# Patient Record
Sex: Female | Born: 1950 | Race: Black or African American | Hispanic: No | Smoking: Former smoker
Health system: Southern US, Community
[De-identification: ages and names within clinical notes are randomized; demographics above are authoritative.]

## PROBLEM LIST (undated history)

## (undated) DIAGNOSIS — E785 Hyperlipidemia, unspecified: Secondary | ICD-10-CM

## (undated) DIAGNOSIS — H409 Unspecified glaucoma: Secondary | ICD-10-CM

## (undated) DIAGNOSIS — N289 Disorder of kidney and ureter, unspecified: Secondary | ICD-10-CM

## (undated) DIAGNOSIS — I10 Essential (primary) hypertension: Secondary | ICD-10-CM

## (undated) DIAGNOSIS — I6523 Occlusion and stenosis of bilateral carotid arteries: Secondary | ICD-10-CM

## (undated) DIAGNOSIS — Z9289 Personal history of other medical treatment: Secondary | ICD-10-CM

## (undated) DIAGNOSIS — M1990 Unspecified osteoarthritis, unspecified site: Secondary | ICD-10-CM

## (undated) DIAGNOSIS — K8020 Calculus of gallbladder without cholecystitis without obstruction: Secondary | ICD-10-CM

## (undated) DIAGNOSIS — I82409 Acute embolism and thrombosis of unspecified deep veins of unspecified lower extremity: Secondary | ICD-10-CM

## (undated) DIAGNOSIS — K219 Gastro-esophageal reflux disease without esophagitis: Secondary | ICD-10-CM

## (undated) DIAGNOSIS — T8610 Unspecified complication of kidney transplant: Secondary | ICD-10-CM

## (undated) DIAGNOSIS — D6861 Antiphospholipid syndrome: Secondary | ICD-10-CM

## (undated) DIAGNOSIS — N186 End stage renal disease: Secondary | ICD-10-CM

## (undated) DIAGNOSIS — I272 Pulmonary hypertension, unspecified: Secondary | ICD-10-CM

## (undated) DIAGNOSIS — J189 Pneumonia, unspecified organism: Secondary | ICD-10-CM

## (undated) DIAGNOSIS — G5600 Carpal tunnel syndrome, unspecified upper limb: Secondary | ICD-10-CM

## (undated) DIAGNOSIS — I480 Paroxysmal atrial fibrillation: Secondary | ICD-10-CM

## (undated) DIAGNOSIS — K635 Polyp of colon: Secondary | ICD-10-CM

## (undated) DIAGNOSIS — I2510 Atherosclerotic heart disease of native coronary artery without angina pectoris: Secondary | ICD-10-CM

## (undated) DIAGNOSIS — K649 Unspecified hemorrhoids: Secondary | ICD-10-CM

## (undated) DIAGNOSIS — K222 Esophageal obstruction: Secondary | ICD-10-CM

## (undated) DIAGNOSIS — R011 Cardiac murmur, unspecified: Secondary | ICD-10-CM

## (undated) DIAGNOSIS — Z992 Dependence on renal dialysis: Secondary | ICD-10-CM

## (undated) HISTORY — DX: Esophageal obstruction: K22.2

## (undated) HISTORY — DX: Antiphospholipid syndrome: D68.61

## (undated) HISTORY — PX: PERITONEAL CATHETER REMOVAL: SUR1106

## (undated) HISTORY — DX: Unspecified hemorrhoids: K64.9

## (undated) HISTORY — DX: Carpal tunnel syndrome, unspecified upper limb: G56.00

## (undated) HISTORY — PX: PERITONEAL CATHETER INSERTION: SHX2223

## (undated) HISTORY — PX: ARTERIOVENOUS GRAFT PLACEMENT: SUR1029

## (undated) HISTORY — DX: Hyperlipidemia, unspecified: E78.5

## (undated) HISTORY — DX: Occlusion and stenosis of bilateral carotid arteries: I65.23

## (undated) HISTORY — DX: Calculus of gallbladder without cholecystitis without obstruction: K80.20

## (undated) HISTORY — DX: Essential (primary) hypertension: I10

## (undated) HISTORY — DX: Polyp of colon: K63.5

## (undated) HISTORY — DX: Pulmonary hypertension, unspecified: I27.20

## (undated) HISTORY — DX: Paroxysmal atrial fibrillation: I48.0

## (undated) HISTORY — PX: THROMBECTOMY AND REVISION OF ARTERIOVENTOUS (AV) GORETEX  GRAFT: SHX6120

---

## 1978-01-22 DIAGNOSIS — K8020 Calculus of gallbladder without cholecystitis without obstruction: Secondary | ICD-10-CM

## 1978-01-22 HISTORY — DX: Calculus of gallbladder without cholecystitis without obstruction: K80.20

## 1978-01-22 HISTORY — PX: CHOLECYSTECTOMY: SHX55

## 1988-01-23 DIAGNOSIS — I82409 Acute embolism and thrombosis of unspecified deep veins of unspecified lower extremity: Secondary | ICD-10-CM

## 1988-01-23 HISTORY — DX: Acute embolism and thrombosis of unspecified deep veins of unspecified lower extremity: I82.409

## 1996-01-23 HISTORY — PX: HEMATOMA EVACUATION: SHX5118

## 1997-05-17 ENCOUNTER — Other Ambulatory Visit: Admission: RE | Admit: 1997-05-17 | Discharge: 1997-05-17 | Payer: Self-pay | Admitting: Nephrology

## 1997-05-28 ENCOUNTER — Other Ambulatory Visit: Admission: RE | Admit: 1997-05-28 | Discharge: 1997-05-28 | Payer: Self-pay | Admitting: Nephrology

## 1997-06-07 ENCOUNTER — Other Ambulatory Visit: Admission: RE | Admit: 1997-06-07 | Discharge: 1997-06-07 | Payer: Self-pay | Admitting: Nephrology

## 1997-06-09 ENCOUNTER — Other Ambulatory Visit: Admission: RE | Admit: 1997-06-09 | Discharge: 1997-06-09 | Payer: Self-pay | Admitting: Nephrology

## 1997-06-16 ENCOUNTER — Other Ambulatory Visit: Admission: RE | Admit: 1997-06-16 | Discharge: 1997-06-16 | Payer: Self-pay | Admitting: Nephrology

## 1997-06-25 ENCOUNTER — Other Ambulatory Visit: Admission: RE | Admit: 1997-06-25 | Discharge: 1997-06-25 | Payer: Self-pay | Admitting: Nephrology

## 1997-07-07 ENCOUNTER — Other Ambulatory Visit: Admission: RE | Admit: 1997-07-07 | Discharge: 1997-07-07 | Payer: Self-pay | Admitting: Nephrology

## 1997-07-14 ENCOUNTER — Other Ambulatory Visit: Admission: RE | Admit: 1997-07-14 | Discharge: 1997-07-14 | Payer: Self-pay | Admitting: Nephrology

## 1997-07-23 ENCOUNTER — Other Ambulatory Visit: Admission: RE | Admit: 1997-07-23 | Discharge: 1997-07-23 | Payer: Self-pay | Admitting: Nephrology

## 1997-08-06 ENCOUNTER — Other Ambulatory Visit: Admission: RE | Admit: 1997-08-06 | Discharge: 1997-08-06 | Payer: Self-pay | Admitting: Nephrology

## 1997-08-11 ENCOUNTER — Other Ambulatory Visit: Admission: RE | Admit: 1997-08-11 | Discharge: 1997-08-11 | Payer: Self-pay | Admitting: Nephrology

## 1997-08-18 ENCOUNTER — Other Ambulatory Visit: Admission: RE | Admit: 1997-08-18 | Discharge: 1997-08-18 | Payer: Self-pay | Admitting: Nephrology

## 1997-08-20 ENCOUNTER — Other Ambulatory Visit: Admission: RE | Admit: 1997-08-20 | Discharge: 1997-08-20 | Payer: Self-pay | Admitting: Nephrology

## 1997-09-10 ENCOUNTER — Ambulatory Visit (HOSPITAL_COMMUNITY): Admission: RE | Admit: 1997-09-10 | Discharge: 1997-09-10 | Payer: Self-pay | Admitting: *Deleted

## 1997-09-21 ENCOUNTER — Ambulatory Visit (HOSPITAL_COMMUNITY): Admission: RE | Admit: 1997-09-21 | Discharge: 1997-09-21 | Payer: Self-pay | Admitting: Internal Medicine

## 1997-09-22 ENCOUNTER — Ambulatory Visit (HOSPITAL_COMMUNITY): Admission: RE | Admit: 1997-09-22 | Discharge: 1997-09-22 | Payer: Self-pay | Admitting: Vascular Surgery

## 1997-11-09 ENCOUNTER — Encounter: Payer: Self-pay | Admitting: Vascular Surgery

## 1997-11-09 ENCOUNTER — Ambulatory Visit (HOSPITAL_COMMUNITY): Admission: RE | Admit: 1997-11-09 | Discharge: 1997-11-09 | Payer: Self-pay | Admitting: Vascular Surgery

## 1997-11-12 ENCOUNTER — Encounter: Payer: Self-pay | Admitting: Thoracic Surgery

## 1997-11-12 ENCOUNTER — Ambulatory Visit (HOSPITAL_COMMUNITY): Admission: RE | Admit: 1997-11-12 | Discharge: 1997-11-12 | Payer: Self-pay | Admitting: Thoracic Surgery

## 1998-01-16 ENCOUNTER — Observation Stay (HOSPITAL_COMMUNITY): Admission: EM | Admit: 1998-01-16 | Discharge: 1998-01-16 | Payer: Self-pay | Admitting: Emergency Medicine

## 1998-09-05 ENCOUNTER — Observation Stay (HOSPITAL_COMMUNITY): Admission: EM | Admit: 1998-09-05 | Discharge: 1998-09-05 | Payer: Self-pay | Admitting: Emergency Medicine

## 1998-09-05 ENCOUNTER — Encounter: Payer: Self-pay | Admitting: *Deleted

## 1998-10-11 ENCOUNTER — Encounter: Payer: Self-pay | Admitting: Vascular Surgery

## 1998-10-11 ENCOUNTER — Ambulatory Visit (HOSPITAL_COMMUNITY): Admission: RE | Admit: 1998-10-11 | Discharge: 1998-10-11 | Payer: Self-pay | Admitting: Vascular Surgery

## 1998-10-22 ENCOUNTER — Encounter: Payer: Self-pay | Admitting: Vascular Surgery

## 1998-10-22 ENCOUNTER — Ambulatory Visit (HOSPITAL_COMMUNITY): Admission: RE | Admit: 1998-10-22 | Discharge: 1998-10-22 | Payer: Self-pay | Admitting: Vascular Surgery

## 1999-07-30 ENCOUNTER — Emergency Department (HOSPITAL_COMMUNITY): Admission: EM | Admit: 1999-07-30 | Discharge: 1999-07-30 | Payer: Self-pay | Admitting: Emergency Medicine

## 1999-07-31 ENCOUNTER — Ambulatory Visit (HOSPITAL_COMMUNITY): Admission: RE | Admit: 1999-07-31 | Discharge: 1999-07-31 | Payer: Self-pay | Admitting: Vascular Surgery

## 1999-11-02 ENCOUNTER — Emergency Department (HOSPITAL_COMMUNITY): Admission: EM | Admit: 1999-11-02 | Discharge: 1999-11-02 | Payer: Self-pay | Admitting: Emergency Medicine

## 1999-11-02 ENCOUNTER — Encounter: Payer: Self-pay | Admitting: Emergency Medicine

## 1999-12-09 ENCOUNTER — Emergency Department (HOSPITAL_COMMUNITY): Admission: EM | Admit: 1999-12-09 | Discharge: 1999-12-10 | Payer: Self-pay | Admitting: Emergency Medicine

## 2000-12-05 ENCOUNTER — Emergency Department (HOSPITAL_COMMUNITY): Admission: EM | Admit: 2000-12-05 | Discharge: 2000-12-05 | Payer: Self-pay | Admitting: Emergency Medicine

## 2001-01-22 DIAGNOSIS — G5600 Carpal tunnel syndrome, unspecified upper limb: Secondary | ICD-10-CM

## 2001-01-22 HISTORY — DX: Carpal tunnel syndrome, unspecified upper limb: G56.00

## 2001-06-26 ENCOUNTER — Other Ambulatory Visit: Admission: RE | Admit: 2001-06-26 | Discharge: 2001-06-26 | Payer: Self-pay | Admitting: Obstetrics and Gynecology

## 2001-07-22 HISTORY — PX: CARPAL TUNNEL RELEASE: SHX101

## 2001-08-12 ENCOUNTER — Encounter (INDEPENDENT_AMBULATORY_CARE_PROVIDER_SITE_OTHER): Payer: Self-pay | Admitting: *Deleted

## 2001-08-12 ENCOUNTER — Ambulatory Visit (HOSPITAL_BASED_OUTPATIENT_CLINIC_OR_DEPARTMENT_OTHER): Admission: RE | Admit: 2001-08-12 | Discharge: 2001-08-12 | Payer: Self-pay | Admitting: Orthopedic Surgery

## 2001-08-17 ENCOUNTER — Emergency Department (HOSPITAL_COMMUNITY): Admission: EM | Admit: 2001-08-17 | Discharge: 2001-08-17 | Payer: Self-pay | Admitting: *Deleted

## 2001-11-11 ENCOUNTER — Other Ambulatory Visit: Admission: RE | Admit: 2001-11-11 | Discharge: 2001-11-11 | Payer: Self-pay | Admitting: Obstetrics and Gynecology

## 2001-11-11 ENCOUNTER — Other Ambulatory Visit: Admission: RE | Admit: 2001-11-11 | Discharge: 2001-11-11 | Payer: Self-pay | Admitting: *Deleted

## 2002-01-20 ENCOUNTER — Other Ambulatory Visit: Admission: RE | Admit: 2002-01-20 | Discharge: 2002-01-20 | Payer: Self-pay | Admitting: Obstetrics and Gynecology

## 2002-04-28 ENCOUNTER — Encounter: Payer: Self-pay | Admitting: Emergency Medicine

## 2002-04-28 ENCOUNTER — Emergency Department (HOSPITAL_COMMUNITY): Admission: EM | Admit: 2002-04-28 | Discharge: 2002-04-28 | Payer: Self-pay | Admitting: Emergency Medicine

## 2002-05-18 ENCOUNTER — Ambulatory Visit (HOSPITAL_COMMUNITY): Admission: RE | Admit: 2002-05-18 | Discharge: 2002-05-18 | Payer: Self-pay | Admitting: Vascular Surgery

## 2002-05-18 ENCOUNTER — Encounter: Payer: Self-pay | Admitting: Vascular Surgery

## 2002-10-07 ENCOUNTER — Encounter: Payer: Self-pay | Admitting: Vascular Surgery

## 2002-10-07 ENCOUNTER — Ambulatory Visit (HOSPITAL_COMMUNITY): Admission: RE | Admit: 2002-10-07 | Discharge: 2002-10-07 | Payer: Self-pay | Admitting: Vascular Surgery

## 2002-10-20 ENCOUNTER — Encounter: Payer: Self-pay | Admitting: Vascular Surgery

## 2002-10-20 ENCOUNTER — Ambulatory Visit (HOSPITAL_COMMUNITY): Admission: RE | Admit: 2002-10-20 | Discharge: 2002-10-20 | Payer: Self-pay | Admitting: Vascular Surgery

## 2002-11-17 ENCOUNTER — Ambulatory Visit (HOSPITAL_COMMUNITY): Admission: RE | Admit: 2002-11-17 | Discharge: 2002-11-17 | Payer: Self-pay | Admitting: Nephrology

## 2003-06-01 ENCOUNTER — Emergency Department (HOSPITAL_COMMUNITY): Admission: EM | Admit: 2003-06-01 | Discharge: 2003-06-01 | Payer: Self-pay

## 2003-06-03 ENCOUNTER — Emergency Department (HOSPITAL_COMMUNITY): Admission: EM | Admit: 2003-06-03 | Discharge: 2003-06-03 | Payer: Self-pay | Admitting: Emergency Medicine

## 2003-10-02 ENCOUNTER — Emergency Department (HOSPITAL_COMMUNITY): Admission: EM | Admit: 2003-10-02 | Discharge: 2003-10-02 | Payer: Self-pay | Admitting: *Deleted

## 2003-10-08 ENCOUNTER — Encounter: Admission: RE | Admit: 2003-10-08 | Discharge: 2003-10-08 | Payer: Self-pay | Admitting: Nephrology

## 2003-12-20 ENCOUNTER — Emergency Department (HOSPITAL_COMMUNITY): Admission: EM | Admit: 2003-12-20 | Discharge: 2003-12-20 | Payer: Self-pay | Admitting: Emergency Medicine

## 2003-12-28 ENCOUNTER — Encounter: Admission: RE | Admit: 2003-12-28 | Discharge: 2003-12-28 | Payer: Self-pay | Admitting: Nephrology

## 2004-06-23 ENCOUNTER — Ambulatory Visit: Payer: Self-pay | Admitting: Internal Medicine

## 2004-07-17 ENCOUNTER — Ambulatory Visit: Payer: Self-pay | Admitting: Internal Medicine

## 2004-07-17 ENCOUNTER — Ambulatory Visit (HOSPITAL_COMMUNITY): Admission: RE | Admit: 2004-07-17 | Discharge: 2004-07-17 | Payer: Self-pay | Admitting: Internal Medicine

## 2004-08-04 ENCOUNTER — Other Ambulatory Visit: Admission: RE | Admit: 2004-08-04 | Discharge: 2004-08-04 | Payer: Self-pay | Admitting: Obstetrics & Gynecology

## 2004-08-07 ENCOUNTER — Other Ambulatory Visit: Admission: RE | Admit: 2004-08-07 | Discharge: 2004-08-07 | Payer: Self-pay | Admitting: Obstetrics & Gynecology

## 2006-01-07 ENCOUNTER — Ambulatory Visit (HOSPITAL_COMMUNITY): Admission: RE | Admit: 2006-01-07 | Discharge: 2006-01-07 | Payer: Self-pay | Admitting: Nephrology

## 2006-01-14 ENCOUNTER — Ambulatory Visit (HOSPITAL_COMMUNITY): Admission: RE | Admit: 2006-01-14 | Discharge: 2006-01-14 | Payer: Self-pay | Admitting: Vascular Surgery

## 2006-03-07 ENCOUNTER — Emergency Department (HOSPITAL_COMMUNITY): Admission: EM | Admit: 2006-03-07 | Discharge: 2006-03-07 | Payer: Self-pay | Admitting: Emergency Medicine

## 2006-04-08 ENCOUNTER — Ambulatory Visit (HOSPITAL_COMMUNITY): Admission: RE | Admit: 2006-04-08 | Discharge: 2006-04-08 | Payer: Self-pay | Admitting: Nephrology

## 2007-05-26 ENCOUNTER — Ambulatory Visit (HOSPITAL_COMMUNITY): Admission: RE | Admit: 2007-05-26 | Discharge: 2007-05-26 | Payer: Self-pay | Admitting: Nephrology

## 2007-07-04 ENCOUNTER — Emergency Department (HOSPITAL_COMMUNITY): Admission: EM | Admit: 2007-07-04 | Discharge: 2007-07-04 | Payer: Self-pay | Admitting: Emergency Medicine

## 2007-09-14 ENCOUNTER — Ambulatory Visit: Payer: Self-pay | Admitting: Vascular Surgery

## 2007-09-14 ENCOUNTER — Observation Stay (HOSPITAL_COMMUNITY): Admission: EM | Admit: 2007-09-14 | Discharge: 2007-09-14 | Payer: Self-pay | Admitting: Emergency Medicine

## 2007-10-27 ENCOUNTER — Ambulatory Visit (HOSPITAL_COMMUNITY): Admission: RE | Admit: 2007-10-27 | Discharge: 2007-10-27 | Payer: Self-pay | Admitting: Nephrology

## 2007-10-30 ENCOUNTER — Ambulatory Visit (HOSPITAL_COMMUNITY): Admission: RE | Admit: 2007-10-30 | Discharge: 2007-10-30 | Payer: Self-pay | Admitting: Nephrology

## 2008-04-04 ENCOUNTER — Emergency Department (HOSPITAL_COMMUNITY): Admission: EM | Admit: 2008-04-04 | Discharge: 2008-04-04 | Payer: Self-pay | Admitting: Emergency Medicine

## 2008-04-26 ENCOUNTER — Ambulatory Visit (HOSPITAL_COMMUNITY): Admission: RE | Admit: 2008-04-26 | Discharge: 2008-04-26 | Payer: Self-pay | Admitting: Nephrology

## 2008-05-22 HISTORY — PX: NEPHRECTOMY TRANSPLANTED ORGAN: SUR880

## 2008-05-25 ENCOUNTER — Ambulatory Visit (HOSPITAL_COMMUNITY): Admission: RE | Admit: 2008-05-25 | Discharge: 2008-05-25 | Payer: Self-pay | Admitting: Nephrology

## 2008-10-07 ENCOUNTER — Emergency Department (HOSPITAL_COMMUNITY): Admission: EM | Admit: 2008-10-07 | Discharge: 2008-10-07 | Payer: Self-pay | Admitting: Emergency Medicine

## 2008-11-04 ENCOUNTER — Encounter (HOSPITAL_COMMUNITY): Admission: RE | Admit: 2008-11-04 | Discharge: 2009-02-02 | Payer: Self-pay | Admitting: Nephrology

## 2008-12-06 ENCOUNTER — Encounter: Admission: RE | Admit: 2008-12-06 | Discharge: 2008-12-06 | Payer: Self-pay | Admitting: Nephrology

## 2008-12-20 ENCOUNTER — Encounter: Admission: RE | Admit: 2008-12-20 | Discharge: 2008-12-20 | Payer: Self-pay | Admitting: Nephrology

## 2008-12-23 ENCOUNTER — Emergency Department (HOSPITAL_COMMUNITY): Admission: EM | Admit: 2008-12-23 | Discharge: 2008-12-23 | Payer: Self-pay | Admitting: Emergency Medicine

## 2009-02-16 ENCOUNTER — Encounter: Admission: RE | Admit: 2009-02-16 | Discharge: 2009-02-16 | Payer: Self-pay | Admitting: Nephrology

## 2009-02-23 ENCOUNTER — Encounter (HOSPITAL_COMMUNITY): Admission: RE | Admit: 2009-02-23 | Discharge: 2009-05-24 | Payer: Self-pay | Admitting: Nephrology

## 2009-06-08 ENCOUNTER — Encounter (HOSPITAL_COMMUNITY): Admission: RE | Admit: 2009-06-08 | Discharge: 2009-09-06 | Payer: Self-pay | Admitting: Nephrology

## 2009-09-16 ENCOUNTER — Encounter (HOSPITAL_COMMUNITY): Admission: RE | Admit: 2009-09-16 | Discharge: 2009-12-15 | Payer: Self-pay | Admitting: Nephrology

## 2009-12-20 ENCOUNTER — Encounter (HOSPITAL_COMMUNITY)
Admission: RE | Admit: 2009-12-20 | Discharge: 2010-02-21 | Payer: Self-pay | Source: Home / Self Care | Attending: Nephrology | Admitting: Nephrology

## 2009-12-27 ENCOUNTER — Encounter: Admission: RE | Admit: 2009-12-27 | Discharge: 2009-12-27 | Payer: Self-pay | Admitting: Nephrology

## 2010-02-06 LAB — IRON AND TIBC
Iron: 53 ug/dL (ref 42–135)
Saturation Ratios: 23 % (ref 20–55)
TIBC: 235 ug/dL — ABNORMAL LOW (ref 250–470)
UIBC: 182 ug/dL

## 2010-02-06 LAB — POCT HEMOGLOBIN-HEMACUE: Hemoglobin: 11.2 g/dL — ABNORMAL LOW (ref 12.0–15.0)

## 2010-02-06 LAB — FERRITIN: Ferritin: 1013 ng/mL — ABNORMAL HIGH (ref 10–291)

## 2010-02-12 ENCOUNTER — Encounter: Payer: Self-pay | Admitting: Nephrology

## 2010-03-09 ENCOUNTER — Other Ambulatory Visit: Payer: Self-pay

## 2010-03-09 ENCOUNTER — Other Ambulatory Visit: Payer: Self-pay | Admitting: Nephrology

## 2010-03-09 ENCOUNTER — Encounter (HOSPITAL_COMMUNITY)
Admission: RE | Admit: 2010-03-09 | Discharge: 2010-03-09 | Disposition: A | Discharge status: Home / Self Care | Payer: Medicare Other | Source: Ambulatory Visit | Attending: Nephrology | Admitting: Nephrology

## 2010-03-09 DIAGNOSIS — M329 Systemic lupus erythematosus, unspecified: Secondary | ICD-10-CM | POA: Insufficient documentation

## 2010-03-09 DIAGNOSIS — Z940 Kidney transplant status: Secondary | ICD-10-CM | POA: Insufficient documentation

## 2010-03-09 DIAGNOSIS — D638 Anemia in other chronic diseases classified elsewhere: Secondary | ICD-10-CM | POA: Insufficient documentation

## 2010-03-09 LAB — IRON AND TIBC
Saturation Ratios: 31 % (ref 20–55)
TIBC: 226 ug/dL — ABNORMAL LOW (ref 250–470)
UIBC: 155 ug/dL

## 2010-03-10 LAB — POCT HEMOGLOBIN-HEMACUE: Hemoglobin: 11.4 g/dL — ABNORMAL LOW (ref 12.0–15.0)

## 2010-03-13 ENCOUNTER — Emergency Department (HOSPITAL_COMMUNITY): Payer: Medicare Other

## 2010-03-13 ENCOUNTER — Emergency Department (HOSPITAL_COMMUNITY)
Admission: EM | Admit: 2010-03-13 | Discharge: 2010-03-13 | Disposition: A | Discharge status: Home / Self Care | Payer: Medicare Other | Attending: Emergency Medicine | Admitting: Emergency Medicine

## 2010-03-13 DIAGNOSIS — Z79899 Other long term (current) drug therapy: Secondary | ICD-10-CM | POA: Insufficient documentation

## 2010-03-13 DIAGNOSIS — I129 Hypertensive chronic kidney disease with stage 1 through stage 4 chronic kidney disease, or unspecified chronic kidney disease: Secondary | ICD-10-CM | POA: Insufficient documentation

## 2010-03-13 DIAGNOSIS — Z7901 Long term (current) use of anticoagulants: Secondary | ICD-10-CM | POA: Insufficient documentation

## 2010-03-13 DIAGNOSIS — M329 Systemic lupus erythematosus, unspecified: Secondary | ICD-10-CM | POA: Insufficient documentation

## 2010-03-13 DIAGNOSIS — M79609 Pain in unspecified limb: Secondary | ICD-10-CM | POA: Insufficient documentation

## 2010-03-13 DIAGNOSIS — Z7982 Long term (current) use of aspirin: Secondary | ICD-10-CM | POA: Insufficient documentation

## 2010-03-13 DIAGNOSIS — N189 Chronic kidney disease, unspecified: Secondary | ICD-10-CM | POA: Insufficient documentation

## 2010-03-24 ENCOUNTER — Encounter (HOSPITAL_COMMUNITY): Payer: Medicare Other

## 2010-04-03 LAB — IRON AND TIBC
Saturation Ratios: 29 % (ref 20–55)
UIBC: 172 ug/dL

## 2010-04-04 LAB — IRON AND TIBC
Iron: 68 ug/dL (ref 42–135)
Saturation Ratios: 30 % (ref 20–55)
TIBC: 224 ug/dL — ABNORMAL LOW (ref 250–470)

## 2010-04-04 LAB — POCT HEMOGLOBIN-HEMACUE: Hemoglobin: 11.9 g/dL — ABNORMAL LOW (ref 12.0–15.0)

## 2010-04-06 LAB — IRON AND TIBC
Iron: 61 ug/dL (ref 42–135)
Saturation Ratios: 27 % (ref 20–55)
TIBC: 222 ug/dL — ABNORMAL LOW (ref 250–470)
UIBC: 161 ug/dL

## 2010-04-06 LAB — POCT HEMOGLOBIN-HEMACUE: Hemoglobin: 12.1 g/dL (ref 12.0–15.0)

## 2010-04-07 ENCOUNTER — Other Ambulatory Visit: Payer: Self-pay | Admitting: Nephrology

## 2010-04-07 ENCOUNTER — Encounter (HOSPITAL_COMMUNITY): Payer: Medicare Other | Attending: Nephrology

## 2010-04-07 ENCOUNTER — Other Ambulatory Visit: Payer: Self-pay

## 2010-04-07 DIAGNOSIS — D638 Anemia in other chronic diseases classified elsewhere: Secondary | ICD-10-CM | POA: Insufficient documentation

## 2010-04-07 DIAGNOSIS — M329 Systemic lupus erythematosus, unspecified: Secondary | ICD-10-CM | POA: Insufficient documentation

## 2010-04-07 DIAGNOSIS — Z940 Kidney transplant status: Secondary | ICD-10-CM | POA: Insufficient documentation

## 2010-04-07 LAB — POCT HEMOGLOBIN-HEMACUE: Hemoglobin: 11.5 g/dL — ABNORMAL LOW (ref 12.0–15.0)

## 2010-04-07 LAB — IRON AND TIBC
Iron: 73 ug/dL (ref 42–135)
Saturation Ratios: 26 % (ref 20–55)
TIBC: 231 ug/dL — ABNORMAL LOW (ref 250–470)
UIBC: 170 ug/dL

## 2010-04-07 LAB — FERRITIN: Ferritin: 1231 ng/mL — ABNORMAL HIGH (ref 10–291)

## 2010-04-08 LAB — IRON AND TIBC
Iron: 40 ug/dL — ABNORMAL LOW (ref 42–135)
Saturation Ratios: 31 % (ref 20–55)
TIBC: 221 ug/dL — ABNORMAL LOW (ref 250–470)
TIBC: 214 ug/dL — ABNORMAL LOW (ref 250–470)
UIBC: 152 ug/dL

## 2010-04-08 LAB — POCT HEMOGLOBIN-HEMACUE: Hemoglobin: 11.1 g/dL — ABNORMAL LOW (ref 12.0–15.0)

## 2010-04-08 LAB — FERRITIN: Ferritin: 767 ng/mL — ABNORMAL HIGH (ref 10–291)

## 2010-04-09 LAB — POCT HEMOGLOBIN-HEMACUE: Hemoglobin: 12.4 g/dL (ref 12.0–15.0)

## 2010-04-09 LAB — IRON AND TIBC
Iron: 81 ug/dL (ref 42–135)
TIBC: 224 ug/dL — ABNORMAL LOW (ref 250–470)
UIBC: 143 ug/dL

## 2010-04-10 LAB — POCT HEMOGLOBIN-HEMACUE
Hemoglobin: 12.3 g/dL (ref 12.0–15.0)
Hemoglobin: 12.6 g/dL (ref 12.0–15.0)

## 2010-04-10 LAB — IRON AND TIBC
Saturation Ratios: 29 % (ref 20–55)
UIBC: 153 ug/dL

## 2010-04-11 LAB — POCT HEMOGLOBIN-HEMACUE: Hemoglobin: 11.9 g/dL — ABNORMAL LOW (ref 12.0–15.0)

## 2010-04-12 LAB — IRON AND TIBC
Iron: 34 ug/dL — ABNORMAL LOW (ref 42–135)
Saturation Ratios: 16 % — ABNORMAL LOW (ref 20–55)
UIBC: 178 ug/dL
UIBC: 182 ug/dL

## 2010-04-12 LAB — POCT HEMOGLOBIN-HEMACUE: Hemoglobin: 11.4 g/dL — ABNORMAL LOW (ref 12.0–15.0)

## 2010-04-12 LAB — FERRITIN: Ferritin: 616 ng/mL — ABNORMAL HIGH (ref 10–291)

## 2010-04-14 LAB — IRON AND TIBC
Iron: 42 ug/dL (ref 42–135)
TIBC: 221 ug/dL — ABNORMAL LOW (ref 250–470)

## 2010-04-21 ENCOUNTER — Encounter (HOSPITAL_COMMUNITY): Payer: Medicare Other

## 2010-04-25 LAB — CK TOTAL AND CKMB (NOT AT ARMC)
CK, MB: 4.8 ng/mL — ABNORMAL HIGH (ref 0.3–4.0)
Relative Index: INVALID (ref 0.0–2.5)
Total CK: 93 U/L (ref 7–177)

## 2010-04-25 LAB — CBC
HCT: 34 % — ABNORMAL LOW (ref 36.0–46.0)
Platelets: 191 10*3/uL (ref 150–400)
WBC: 2.8 10*3/uL — ABNORMAL LOW (ref 4.0–10.5)

## 2010-04-25 LAB — BRAIN NATRIURETIC PEPTIDE: Pro B Natriuretic peptide (BNP): 1206 pg/mL — ABNORMAL HIGH (ref 0.0–100.0)

## 2010-04-25 LAB — TROPONIN I: Troponin I: 0.05 ng/mL (ref 0.00–0.06)

## 2010-04-25 LAB — URINALYSIS, ROUTINE W REFLEX MICROSCOPIC
Glucose, UA: NEGATIVE mg/dL
Protein, ur: NEGATIVE mg/dL
pH: 6 (ref 5.0–8.0)

## 2010-04-25 LAB — DIFFERENTIAL
Eosinophils Relative: 2 % (ref 0–5)
Lymphocytes Relative: 19 % (ref 12–46)
Lymphs Abs: 0.5 10*3/uL — ABNORMAL LOW (ref 0.7–4.0)
Neutro Abs: 1.9 10*3/uL (ref 1.7–7.7)

## 2010-04-25 LAB — BASIC METABOLIC PANEL
BUN: 22 mg/dL (ref 6–23)
GFR calc non Af Amer: 32 mL/min — ABNORMAL LOW (ref >60)
Potassium: 5.1 mEq/L (ref 3.5–5.1)
Sodium: 136 mEq/L (ref 135–145)

## 2010-04-25 LAB — IRON AND TIBC: Iron: 60 ug/dL (ref 42–135)

## 2010-04-27 LAB — POCT HEMOGLOBIN-HEMACUE
Hemoglobin: 8.7 g/dL — ABNORMAL LOW (ref 12.0–15.0)
Hemoglobin: 9.5 g/dL — ABNORMAL LOW (ref 12.0–15.0)

## 2010-05-04 ENCOUNTER — Encounter (HOSPITAL_COMMUNITY): Payer: Medicare Other

## 2010-05-11 ENCOUNTER — Other Ambulatory Visit: Payer: Self-pay | Admitting: Nephrology

## 2010-05-11 ENCOUNTER — Encounter (HOSPITAL_COMMUNITY): Payer: Medicare Other | Attending: Nephrology

## 2010-05-11 DIAGNOSIS — D638 Anemia in other chronic diseases classified elsewhere: Secondary | ICD-10-CM | POA: Insufficient documentation

## 2010-05-11 DIAGNOSIS — Z940 Kidney transplant status: Secondary | ICD-10-CM | POA: Insufficient documentation

## 2010-05-11 DIAGNOSIS — M329 Systemic lupus erythematosus, unspecified: Secondary | ICD-10-CM | POA: Insufficient documentation

## 2010-05-11 LAB — POCT HEMOGLOBIN-HEMACUE: Hemoglobin: 10.7 g/dL — ABNORMAL LOW (ref 12.0–15.0)

## 2010-05-11 LAB — IRON AND TIBC
Iron: 53 ug/dL (ref 42–135)
Saturation Ratios: 22 % (ref 20–55)
TIBC: 236 ug/dL — ABNORMAL LOW (ref 250–470)
UIBC: 183 ug/dL

## 2010-05-25 ENCOUNTER — Other Ambulatory Visit: Payer: Self-pay | Admitting: Nephrology

## 2010-05-25 ENCOUNTER — Encounter (HOSPITAL_COMMUNITY): Payer: Medicare Other | Attending: Nephrology

## 2010-05-25 DIAGNOSIS — M329 Systemic lupus erythematosus, unspecified: Secondary | ICD-10-CM | POA: Insufficient documentation

## 2010-05-25 DIAGNOSIS — D638 Anemia in other chronic diseases classified elsewhere: Secondary | ICD-10-CM | POA: Insufficient documentation

## 2010-05-25 DIAGNOSIS — Z940 Kidney transplant status: Secondary | ICD-10-CM | POA: Insufficient documentation

## 2010-06-06 NOTE — Op Note (Signed)
NAMESIBEL, KHURANA                ACCOUNT NO.:  [id number]   MEDICAL RECORD NO.:  [id number]          PATIENT TYPE:  OBV   LOCATION:  2550                         FACILITY:  [facility]   PHYSICIAN:  Di Kindle. Edilia Bo, M.D.DATE OF BIRTH:  1950/03/15   DATE OF PROCEDURE:  DATE OF DISCHARGE:                               OPERATIVE REPORT   PREOPERATIVE DIAGNOSIS:  End-stage renal disease.   POSTOPERATIVE DIAGNOSIS:  End-stage renal disease.   PROCEDURE:  Thrombectomy of right upper arm AV graft with revision of  graft (insertion of new segment of 7-mm PTFE to the more proximal  axillary vein.)   SURGEON:  Di Kindle. Edilia Bo, MD   ASSISTANT:  Nurse.   ANESTHESIA:  Local with sedation technique.   The patient was taken to the operating room sedated by anesthesia.  The  right upper extremity was prepped and draped in the usual sterile  fashion.  After the skin was infiltrated with 1% lidocaine, the incision  in the axilla was opened and the venous limb of the graft dissected  free.  I dissected up higher on the vein, higher up in the axilla, and  the vein here was patent.  The old venous anastomosis was excised.  The  patient was heparinized.  Graft thrombectomy was achieved using a #4  Fogarty catheter.  The arterial plug was clearly retrieved.  The graft  was flushed with heparinized saline clamp.  A new segment of graft was  brought into field, spatulated, and sewn end to end to the more proximal  spatulated vein using continuous 6-0 Prolene suture.  The new segment of  graft was then cut to the appropriate length and anastomosed end to end  to the old graft using continuous 6-0 Prolene suture.  At the  completion, there was an excellent thrill in the graft.  Hemostasis was  obtained in the wounds.  The wound was closed with a deep layer of 3-0  Vicryl and the skin closed with 4-0 Vicryl.  A sterile dressing was  applied.  The patient tolerated the procedure well and was  transferred  to the recovery room in satisfactory condition.  All needle and sponge  counts were correct.      Di Kindle. Edilia Bo, M.D.  Electronically Signed     CSD/MEDQ  D:  09/14/2007  T:  09/15/2007  Job:  [id number]

## 2010-06-08 ENCOUNTER — Encounter (HOSPITAL_COMMUNITY): Payer: Medicare Other

## 2010-06-09 NOTE — Op Note (Signed)
NAMEJULLIA, Debbie [name]                ACCOUNT NO.:  [id number]   MEDICAL RECORD NO.:  [id number]          PATIENT TYPE:  AMB   LOCATION:  SDS                          FACILITY:  [facility]   PHYSICIAN:  Balinda Quails, M.D.    DATE OF BIRTH:  1950/02/19   DATE OF PROCEDURE:  01/14/2006  DATE OF DISCHARGE:                               OPERATIVE REPORT   SURGEON:  P Bud Face, MD   ASSISTANT:  Gershon Crane, PA.   ANESTHETIC:  Local MAC.   PREOPERATIVE DIAGNOSES:  1. End-stage renal failure.  2. Clotted right upper arm arteriovenous graft.   POSTOPERATIVE DIAGNOSES:  1. End-stage renal failure.  2. Clotted right upper arm arteriovenous graft.   PROCEDURE:  Thrombectomy and revision right upper arm arteriovenous  graft.   OPERATIVE PROCEDURE:  The patient brought to the operating room in  stable hemodynamic condition.  Placed in supine position.  Right arm  prepped and draped in sterile fashion.   Skin and subcutaneous tissues instilled with 1% Xylocaine with  epinephrine.  Longitudinal skin incision made through the scar in the  right axilla.  Deep dissection carried down expose the venous  anastomosis.  The graft was mobilized and encircled with vessel loop.  The vein freed proximally in the axilla.  The vein controlled with a  Gregory clamp.  The venous anastomosis taken down.  There was  pseudointimal narrowing present.  The graft thrombectomized several  times with a 4 Fogarty catheter.  Excellent inflow obtained.  The graft  filled with heparin saline solution and controlled fistula clamp.  A new  segment of 6 mm Gore-Tex was anastomosed end-to-end to divided graft  using running 6-0 Prolene suture.  This was then brought down to the  vein.  The vein thrombectomized without return of clot.  An end-to-end  anastomosis then carried out between the interposition graft and the  vein using running 6-0 Prolene suture.  Clamps were then removed.  Excellent flow present.  Adequate  hemostasis obtained.  Sponge and  instrument counts correct.   Subcutaneous tissue closed with running 3-0 Vicryl suture.  Skin closed  4-0 Monocryl.  Steri-Strips applied.  No apparent complications.  The  patient transferred recovery room in stable condition.     Balinda Quails, M.D.  Electronically Signed    PGH/MEDQ  D:  01/14/2006  T:  01/14/2006  Job:  [id number]

## 2010-06-09 NOTE — Op Note (Signed)
[facility]  Patient:    Debbie [name], Debbie [name]                         MRN: [id number] Proc. Date: 07/31/99 Adm. Date:  [id number] Attending:  Alyson Locket                           Operative Report  PREOPERATIVE DIAGNOSIS:  End-stage renal disease with occluded right upper arm arteriovenous Gore-Tex graft.  POSTOPERATIVE DIAGNOSIS:  End-stage renal disease with occluded right upper arm  arteriovenous Gore-Tex graft.  PROCEDURE:  Thrombectomy and interposition jump graft revision of right upper arm arteriovenous Gore-Tex graft.  SURGEON:  Larina Earthly, M.D.  ASSISTANT;  Loura Pardon, P.A.  ANESTHESIA:  Lidocaine, 0.5% local with IV sedation.  COMPLICATIONS:  None.  DISPOSITION:  To the recovery room stable.  PROCEDURE IN DETAIL:  The patient was taken to the operating room and placed in the supine position, where the area of the right arm was prepped and draped in the usual sterile fashion.  Using local anesthesia, an incision was made over the venous anastomosis in the axilla.  The graft was sewn end-to-end to the axillary vein.  The graft was opened near the venous anastomosis and a tight stenosis was found at the venous anastomosis.  The vein above this level was found to be normal by moving the Fogarty catheter through it.  The graft itself was then thrombectomized.  The arterialized plug was removed and excellent end flow obtained.  This was flushed with heparinized saline and reoccluded.  Next, the ein was occluded further proximally with a Cooley clamp and the old anastomosis was  excised.  The vein above this level was of good caliber and was spatulated.  A ew interposition graft of 6 mm Gore-Tex was brought onto the field, spatulated and  sewn end-to-end to the vein and then end-to-end to the old graft with running 6-0 Prolene suture.  The clamps were removed and good flow was noted.  The wound was irrigated with saline.   Hemostasis was obtained with electrocautery.  The wound was closed with 3-0 Vicryl in the subcutaneous and subcuticular layers.  Benzoin and Steri-Strips were applied. DD:  07/31/99 TD:  07/31/99 Job: [id number]

## 2010-06-09 NOTE — Op Note (Signed)
[place]  Patient:    Debbie [name], Debbie [name] Visit Number: [id number] MRN: [id number]          Service Type: DSU Location: [facility] Attending Physician:  Ronne Binning Dictated by:   Nicki Reaper, M.D. Proc. Date: 08/12/01 Admit Date:  08/12/2001 Discharge Date: 08/12/2001                             Operative Report  PREOPERATIVE DIAGNOSIS:  Carpal tunnel syndrome, left hand.  POSTOPERATIVE DIAGNOSIS:  Carpal tunnel syndrome, left hand.  OPERATION:  Decompression of left median nerve.  SURGEON:  Nicki Reaper, M.D.  ANESTHESIA:  IV regional anesthesia.  HISTORY:  The patient is a 60 year old female with a history of carpal tunnel syndrome, EMG and nerve conductions positive.  She is a dialysis patient.  DESCRIPTION OF PROCEDURE:  The patient was brought to the operating room where a forearm-based IV regional anesthetic was carried out without difficulty. She was prepped and draped using Betadine scrubbing solution with the left arm free, supine position.  A longitudinal incision was made in the palm and carried down through subcutaneous tissue; bleeders were electrocauterized. Palmar fascia was split, superficial palmar arch identified and the flexor tendons to the ring and little fingers identified.  To the ulnar side of the median nerve, the carpal retinaculum was incised with sharp dissection.  A right-angle and Sewall retractor were placed between skin and forearm fascia. The fascia was released for approximately 3 cm proximal to the wrist crease. Significant tenosynovitis was present; this was debrided from the flexor tendons.  A portion of retinaculum was sent along with the synovium for amyloid stain.  The wound was irrigated.  The skin was closed with interrupted 5-0 chromic suture.  Sterile compressive dressing and splint were applied. The patient tolerated the procedure well and was taken to the recovery room for observation in  satisfactory condition.  She is discharged home -- to return to the [facility] in one week -- on Vicodin and Keflex. Dictated by:   Nicki Reaper, M.D. Attending Physician:  Ronne Binning DD:  08/12/01 TD:  08/15/01 Job: [id number]

## 2010-06-09 NOTE — Op Note (Signed)
NAMEALAJIA, Debbie [name]                            ACCOUNT NO.:  [id number]   MEDICAL RECORD NO.:  [id number]                   PATIENT TYPE:  OIB   LOCATION:  2890                                 FACILITY:  [facility]   PHYSICIAN:  Quita Skye. Hart Rochester, M.D.               DATE OF BIRTH:  [birth year]   DATE OF PROCEDURE:  DATE OF DISCHARGE:                                 OPERATIVE REPORT   PREOPERATIVE DIAGNOSIS:  Thrombosed AV Gortex graft right upper arm  secondary to 2 pseudoaneurysms of the graft.   POSTOPERATIVE DIAGNOSIS:  Thrombosed AV Gortex graft right upper arm  secondary to 2 pseudoaneurysms of the graft.   PROCEDURE:  1. Attempted thrombectomy AV Gortex graft right upper arm, unsuccessful.  2. Insertion of a new right upper arm AV Gortex graft in brachial artery and     axillary vein.  3. Bilateral ultrasound localization of internal jugular veins.  4. Insertion Diatek catheter right internal jugular vein (24 cm).   SURGEON:  Quita Skye. Hart Rochester, M.D.   FIRST ASSISTANT:  Pecola Leisure, PA   ANESTHESIA:  Local   DESCRIPTION OF PROCEDURE:  The patient was taken to the operating room and  placed in the supine position at which time the right upper extremity was  prepped with Betadine scrubs and solution and draped in a routine sterile  manner.  After infiltration of 1% Xylocaine a short longitudinal incision  was made through the previous scar in the axilla through the Gortex axillary  vein anastomosis and dissected free.   Heparin 3000 units were given intravenously.  Transverse opening made in the  graft proximal to the anastomosis. The graft itself could not be  thrombectomized because of 2 moderate sized pseudoaneurysms involving the  body of the graft.  Therefore, it was decided to insert a new graft  peripheral to the old graft.   The previous anastomosis was excised and the vein was easily  thrombectomized.  It was 5 mm in size.  A second incision was made in the  distal upper arm over the brachial artery anastomosis and the Gortex  dissected free where it had been anastomosed to the brachial artery and a 2-  cm cuff preserved. This was easily thrombectomized with excellent inflow  being present.  A new 6-mm graft was then tunneled peripheral to the old  graft after infiltrating with 1% Xylocaine and then the graft was  anastomosed end-to-end to the arterial side with 6-0 Prolene and end-to-end  with axillary vein with 6-0 Prolene.  Clamps were released and there was an  excellent pulse and thrill in the graft. No protamine was given.  The wounds  were closed in layers with Vicryl in a subcuticular fashion.  Sterile  dressing applied   Attention turned to the upper chest and neck.  Both internal jugular veins  were visualized using a B mode ultrasound.  Both  appear to be soft, pliable,  and patent.  Upper chest and neck were prepped with Betadine solution and  draped in routine sterile manner.  The right internal jugular vein was  entered using a supraclavicular approach.  A guidewire was passed into the  right atrium under fluoroscopic guidance  After dilating the tract  appropriately a 24-cm Diatek catheter was passed through a peel-away sheath,  positioned in the right atrium, and then tunneled appropriately.  The  catheter was secured with Nylon sutures and both ports flushed with heparin-  saline and appropriate heparin solution.   The wound was closed with Vicryl in a subcuticular fashion.  Sterile  dressing applied.  The patient was taken to the recovery room in  satisfactory condition.                                               Quita Skye Hart Rochester, M.D.    JDL/MEDQ  D:  10/07/2002  T:  10/07/2002  Job:  [id number]

## 2010-06-09 NOTE — Op Note (Signed)
   NAMESHOLANDA, CROSON                            ACCOUNT NO.:  [id number]   MEDICAL RECORD NO.:  [id number]                   PATIENT TYPE:  OIB   LOCATION:  2867                                 FACILITY:  [facility]   PHYSICIAN:  Larina Earthly, M.D.                 DATE OF BIRTH:  February 13, 1950   DATE OF PROCEDURE:  05/18/2002  DATE OF DISCHARGE:  05/18/2002                                 OPERATIVE REPORT   PREOPERATIVE DIAGNOSIS:  End-stage renal disease with occluded upper arm  right arteriovenous Gore-Tex graft.   POSTOPERATIVE DIAGNOSIS:  End-stage renal disease with occluded upper arm  right arteriovenous Gore-Tex graft.   PROCEDURE:  Thrombectomy, interposition jump revision, venous anastomosis of  right upper arm arteriovenous Gore-Tex graft.   SURGEON:  Larina Earthly, M.D.   ASSISTANT:  Nurse.   ANESTHESIA:  MAC.   COMPLICATIONS:  None.   DISPOSITION:  To recovery room stable.   DESCRIPTION OF PROCEDURE:  The patient was taken to the operating room and  placed in the supine position, where the area of the right arm and right  axilla were prepped and draped in the usual sterile fashion.  Using local  anesthesia, an incision was made over the axillary anastomosis and carried  down to isolate the vein to graft anastomosis.  This was opened up  longitudinally, and there was a stenosis at the venous anastomosis.  The  vein above this was of very large caliber.  There was good back bleeding,  and there was no thrombus proximally.  This was flushed with heparinized  saline and reoccluded.  Next the graft itself was thrombectomized with the  arterialized plug being removed and excellent inflow encountered.  There was  aneurysmal degeneration of the graft.  This was flushed with heparinized  saline and reoccluded as well.  An interposition graft of 6 mm Gore-Tex was  brought onto the field.  The graft was spatulated and sewn end-to-end to the  vein with a running 6-0 Prolene  suture and then end-to-end to the old graft  with running 6-0 Prolene suture.  Clamps were removed and an excellent  thrill was noted.  The wound was irrigated with saline and hemostased with  electrocautery.  The wound was closed with 3-0 Vicryl in the subcutaneous  and subcuticular tissue, Benzoin and Steri-Strips were applied.                                               Larina Earthly, M.D.    TFE/MEDQ  D:  05/18/2002  T:  05/19/2002  Job:  [id number]

## 2010-06-16 ENCOUNTER — Encounter (HOSPITAL_COMMUNITY): Payer: Medicare Other

## 2010-06-29 ENCOUNTER — Encounter (HOSPITAL_COMMUNITY): Payer: Medicare Other | Attending: Nephrology

## 2010-06-29 ENCOUNTER — Other Ambulatory Visit: Payer: Self-pay | Admitting: Nephrology

## 2010-06-29 DIAGNOSIS — Z940 Kidney transplant status: Secondary | ICD-10-CM | POA: Insufficient documentation

## 2010-06-29 DIAGNOSIS — D638 Anemia in other chronic diseases classified elsewhere: Secondary | ICD-10-CM | POA: Insufficient documentation

## 2010-06-29 DIAGNOSIS — M329 Systemic lupus erythematosus, unspecified: Secondary | ICD-10-CM | POA: Insufficient documentation

## 2010-06-29 LAB — FERRITIN: Ferritin: 1318 ng/mL — ABNORMAL HIGH (ref 10–291)

## 2010-06-29 LAB — IRON AND TIBC: UIBC: 181 ug/dL

## 2010-07-13 ENCOUNTER — Encounter (HOSPITAL_COMMUNITY): Payer: Medicare Other

## 2010-07-13 ENCOUNTER — Other Ambulatory Visit: Payer: Self-pay | Admitting: Nephrology

## 2010-07-13 LAB — POCT HEMOGLOBIN-HEMACUE: Hemoglobin: 12 g/dL (ref 12.0–15.0)

## 2010-07-28 ENCOUNTER — Other Ambulatory Visit: Payer: Self-pay | Admitting: Nephrology

## 2010-07-28 ENCOUNTER — Encounter (HOSPITAL_COMMUNITY): Payer: Medicare Other | Attending: Nephrology

## 2010-07-28 DIAGNOSIS — D638 Anemia in other chronic diseases classified elsewhere: Secondary | ICD-10-CM | POA: Insufficient documentation

## 2010-07-28 DIAGNOSIS — M329 Systemic lupus erythematosus, unspecified: Secondary | ICD-10-CM | POA: Insufficient documentation

## 2010-07-28 DIAGNOSIS — Z940 Kidney transplant status: Secondary | ICD-10-CM | POA: Insufficient documentation

## 2010-07-28 LAB — IRON AND TIBC: Iron: 68 ug/dL (ref 42–135)

## 2010-07-28 LAB — POCT HEMOGLOBIN-HEMACUE: Hemoglobin: 11.8 g/dL — ABNORMAL LOW (ref 12.0–15.0)

## 2010-08-11 ENCOUNTER — Encounter (HOSPITAL_COMMUNITY): Payer: Medicare Other

## 2010-08-30 ENCOUNTER — Other Ambulatory Visit: Payer: Self-pay | Admitting: Nephrology

## 2010-08-30 ENCOUNTER — Encounter (HOSPITAL_COMMUNITY): Payer: Medicare Other | Attending: Nephrology

## 2010-08-30 DIAGNOSIS — M329 Systemic lupus erythematosus, unspecified: Secondary | ICD-10-CM | POA: Insufficient documentation

## 2010-08-30 DIAGNOSIS — Z940 Kidney transplant status: Secondary | ICD-10-CM | POA: Insufficient documentation

## 2010-08-30 DIAGNOSIS — D638 Anemia in other chronic diseases classified elsewhere: Secondary | ICD-10-CM | POA: Insufficient documentation

## 2010-08-30 LAB — FERRITIN: Ferritin: 1234 ng/mL — ABNORMAL HIGH (ref 10–291)

## 2010-08-30 LAB — IRON AND TIBC
Saturation Ratios: 27 % (ref 20–55)
TIBC: 229 ug/dL — ABNORMAL LOW (ref 250–470)

## 2010-09-13 ENCOUNTER — Encounter (HOSPITAL_COMMUNITY): Payer: Medicare Other

## 2010-09-21 ENCOUNTER — Other Ambulatory Visit: Payer: Self-pay | Admitting: Nephrology

## 2010-09-21 ENCOUNTER — Encounter (HOSPITAL_COMMUNITY): Payer: Medicare Other

## 2010-09-26 LAB — POCT HEMOGLOBIN-HEMACUE: Hemoglobin: 11.4 g/dL — ABNORMAL LOW (ref 12.0–15.0)

## 2010-10-09 ENCOUNTER — Other Ambulatory Visit: Payer: Self-pay | Admitting: Nephrology

## 2010-10-09 ENCOUNTER — Encounter (HOSPITAL_COMMUNITY): Payer: Medicare Other | Attending: Nephrology

## 2010-10-09 DIAGNOSIS — D638 Anemia in other chronic diseases classified elsewhere: Secondary | ICD-10-CM | POA: Insufficient documentation

## 2010-10-09 DIAGNOSIS — Z940 Kidney transplant status: Secondary | ICD-10-CM | POA: Insufficient documentation

## 2010-10-09 DIAGNOSIS — M329 Systemic lupus erythematosus, unspecified: Secondary | ICD-10-CM | POA: Insufficient documentation

## 2010-10-09 LAB — FERRITIN: Ferritin: 906 ng/mL — ABNORMAL HIGH (ref 10–291)

## 2010-10-09 LAB — IRON AND TIBC: Saturation Ratios: 19 % — ABNORMAL LOW (ref 20–55)

## 2010-10-23 LAB — POTASSIUM
Potassium: 3 — ABNORMAL LOW
Potassium: 4.3

## 2010-10-24 ENCOUNTER — Encounter (HOSPITAL_COMMUNITY): Payer: Medicare Other

## 2010-11-17 ENCOUNTER — Other Ambulatory Visit: Payer: Self-pay | Admitting: Nephrology

## 2010-11-17 ENCOUNTER — Encounter (HOSPITAL_COMMUNITY)
Admission: RE | Admit: 2010-11-17 | Discharge: 2010-11-17 | Disposition: A | Discharge status: Home / Self Care | Payer: Medicare Other | Source: Ambulatory Visit | Attending: Nephrology | Admitting: Nephrology

## 2010-11-17 DIAGNOSIS — M329 Systemic lupus erythematosus, unspecified: Secondary | ICD-10-CM | POA: Insufficient documentation

## 2010-11-17 DIAGNOSIS — D638 Anemia in other chronic diseases classified elsewhere: Secondary | ICD-10-CM | POA: Insufficient documentation

## 2010-11-17 DIAGNOSIS — Z940 Kidney transplant status: Secondary | ICD-10-CM | POA: Insufficient documentation

## 2010-11-17 LAB — IRON AND TIBC
Iron: 73 ug/dL (ref 42–135)
TIBC: 240 ug/dL — ABNORMAL LOW (ref 250–470)

## 2010-11-24 ENCOUNTER — Other Ambulatory Visit (HOSPITAL_COMMUNITY): Payer: Self-pay | Admitting: *Deleted

## 2010-12-01 ENCOUNTER — Inpatient Hospital Stay (HOSPITAL_COMMUNITY): Admission: RE | Admit: 2010-12-01 | Payer: Medicare Other | Source: Ambulatory Visit

## 2010-12-28 ENCOUNTER — Other Ambulatory Visit (HOSPITAL_COMMUNITY): Payer: Self-pay | Admitting: *Deleted

## 2010-12-29 ENCOUNTER — Encounter (HOSPITAL_COMMUNITY)
Admission: RE | Admit: 2010-12-29 | Discharge: 2010-12-29 | Disposition: A | Discharge status: Home / Self Care | Payer: Medicare Other | Source: Ambulatory Visit | Attending: Nephrology | Admitting: Nephrology

## 2010-12-29 DIAGNOSIS — M329 Systemic lupus erythematosus, unspecified: Secondary | ICD-10-CM | POA: Insufficient documentation

## 2010-12-29 DIAGNOSIS — D638 Anemia in other chronic diseases classified elsewhere: Secondary | ICD-10-CM | POA: Insufficient documentation

## 2010-12-29 DIAGNOSIS — Z940 Kidney transplant status: Secondary | ICD-10-CM | POA: Insufficient documentation

## 2010-12-29 LAB — FERRITIN: Ferritin: 862 ng/mL — ABNORMAL HIGH (ref 10–291)

## 2010-12-29 LAB — IRON AND TIBC
Iron: 59 ug/dL (ref 42–135)
Saturation Ratios: 24 % (ref 20–55)
UIBC: 185 ug/dL (ref 125–400)

## 2011-01-09 ENCOUNTER — Other Ambulatory Visit: Payer: Self-pay | Admitting: Nephrology

## 2011-01-09 DIAGNOSIS — Z1231 Encounter for screening mammogram for malignant neoplasm of breast: Secondary | ICD-10-CM

## 2011-01-26 ENCOUNTER — Encounter (HOSPITAL_COMMUNITY): Payer: Medicare Other | Attending: Nephrology

## 2011-01-26 DIAGNOSIS — Z940 Kidney transplant status: Secondary | ICD-10-CM | POA: Insufficient documentation

## 2011-01-26 DIAGNOSIS — M329 Systemic lupus erythematosus, unspecified: Secondary | ICD-10-CM | POA: Insufficient documentation

## 2011-01-26 DIAGNOSIS — D638 Anemia in other chronic diseases classified elsewhere: Secondary | ICD-10-CM | POA: Insufficient documentation

## 2011-02-06 ENCOUNTER — Ambulatory Visit: Payer: Medicare Other

## 2011-02-20 ENCOUNTER — Ambulatory Visit: Payer: Medicare Other

## 2011-02-23 ENCOUNTER — Encounter (HOSPITAL_COMMUNITY)
Admission: RE | Admit: 2011-02-23 | Discharge: 2011-02-23 | Disposition: A | Discharge status: Home / Self Care | Payer: Medicare Other | Source: Ambulatory Visit | Attending: Nephrology | Admitting: Nephrology

## 2011-02-23 DIAGNOSIS — Z940 Kidney transplant status: Secondary | ICD-10-CM | POA: Insufficient documentation

## 2011-02-23 DIAGNOSIS — D638 Anemia in other chronic diseases classified elsewhere: Secondary | ICD-10-CM | POA: Insufficient documentation

## 2011-02-23 DIAGNOSIS — M329 Systemic lupus erythematosus, unspecified: Secondary | ICD-10-CM | POA: Insufficient documentation

## 2011-02-23 LAB — FERRITIN: Ferritin: 954 ng/mL — ABNORMAL HIGH (ref 10–291)

## 2011-02-23 LAB — IRON AND TIBC: UIBC: 221 ug/dL (ref 125–400)

## 2011-02-23 MED ORDER — EPOETIN ALFA 10000 UNIT/ML IJ SOLN: 20000.0000 [IU] | INTRAMUSCULAR | Status: DC

## 2011-03-14 ENCOUNTER — Ambulatory Visit: Payer: Medicare Other

## 2011-03-23 ENCOUNTER — Encounter (HOSPITAL_COMMUNITY): Payer: Medicare Other

## 2011-04-06 ENCOUNTER — Encounter (HOSPITAL_COMMUNITY): Payer: Self-pay | Admitting: Emergency Medicine

## 2011-04-06 ENCOUNTER — Emergency Department (HOSPITAL_COMMUNITY)
Admission: EM | Admit: 2011-04-06 | Discharge: 2011-04-06 | Disposition: A | Discharge status: Home / Self Care | Payer: Medicare Other | Attending: Emergency Medicine | Admitting: Emergency Medicine

## 2011-04-06 DIAGNOSIS — M549 Dorsalgia, unspecified: Secondary | ICD-10-CM

## 2011-04-06 DIAGNOSIS — Z79899 Other long term (current) drug therapy: Secondary | ICD-10-CM | POA: Insufficient documentation

## 2011-04-06 DIAGNOSIS — M545 Low back pain, unspecified: Secondary | ICD-10-CM | POA: Insufficient documentation

## 2011-04-06 DIAGNOSIS — Z940 Kidney transplant status: Secondary | ICD-10-CM | POA: Insufficient documentation

## 2011-04-06 HISTORY — DX: Disorder of kidney and ureter, unspecified: N28.9

## 2011-04-06 HISTORY — DX: Unspecified complication of kidney transplant: T86.10

## 2011-04-06 LAB — URINE MICROSCOPIC-ADD ON

## 2011-04-06 LAB — URINALYSIS, ROUTINE W REFLEX MICROSCOPIC
Protein, ur: NEGATIVE mg/dL
Urobilinogen, UA: 0.2 mg/dL (ref 0.0–1.0)

## 2011-04-06 NOTE — ED Notes (Signed)
Patient presents with left lower flank pain x several days, patient denies recent injury, frequency, hematuria, and dysuria.  Patient ambulatory in department with assistance.  Patient had right kidney transplant in 2010.

## 2011-04-06 NOTE — Discharge Instructions (Signed)
Use heat on the sore area 2-3 times a day. Take Tylenol every 4 hours if needed for pain.  Back Pain, Adult Low back pain is very common. About 1 in 5 people have back pain.The cause of low back pain is rarely dangerous. The pain often gets better over time.About half of people with a sudden onset of back pain feel better in just 2 weeks. About 8 in 10 people feel better by 6 weeks.  CAUSES Some common causes of back pain include:  Strain of the muscles or ligaments supporting the spine.   Wear and tear (degeneration) of the spinal discs.   Arthritis.   Direct injury to the back.  DIAGNOSIS Most of the time, the direct cause of low back pain is not known.However, back pain can be treated effectively even when the exact cause of the pain is unknown.Answering your caregiver's questions about your overall health and symptoms is one of the most accurate ways to make sure the cause of your pain is not dangerous. If your caregiver needs more information, he or she may order lab work or imaging tests (X-rays or MRIs).However, even if imaging tests show changes in your back, this usually does not require surgery. HOME CARE INSTRUCTIONS For many people, back pain returns.Since low back pain is rarely dangerous, it is often a condition that people can learn to [facility] their own.   Remain active. It is stressful on the back to sit or stand in one place. Do not sit, drive, or stand in one place for more than 30 minutes at a time. Take short walks on level surfaces as soon as pain allows.Try to increase the length of time you walk each day.   Do not stay in bed.Resting more than 1 or 2 days can delay your recovery.   Do not avoid exercise or work.Your body is made to move.It is not dangerous to be active, even though your back may hurt.Your back will likely heal faster if you return to being active before your pain is gone.   Pay attention to your body when you bend and lift. Many people  have less discomfortwhen lifting if they bend their knees, keep the load close to their bodies,and avoid twisting. Often, the most comfortable positions are those that put less stress on your recovering back.   Find a comfortable position to sleep. Use a firm mattress and lie on your side with your knees slightly bent. If you lie on your back, put a pillow under your knees.   Only take over-the-counter or prescription medicines as directed by your caregiver. Over-the-counter medicines to reduce pain and inflammation are often the most helpful.Your caregiver may prescribe muscle relaxant drugs.These medicines help dull your pain so you can more quickly return to your normal activities and healthy exercise.   Put ice on the injured area.   Put ice in a plastic bag.   Place a towel between your skin and the bag.   Leave the ice on for 15 to 20 minutes, 3 to 4 times a day for the first 2 to 3 days. After that, ice and heat may be alternated to reduce pain and spasms.   Ask your caregiver about trying back exercises and gentle massage. This may be of some benefit.   Avoid feeling anxious or stressed.Stress increases muscle tension and can worsen back pain.It is important to recognize when you are anxious or stressed and learn ways to manage it.Exercise is a great option.  SEEK MEDICAL CARE IF:  You have pain that is not relieved with rest or medicine.   You have pain that does not improve in 1 week.   You have new symptoms.   You are generally not feeling well.  SEEK IMMEDIATE MEDICAL CARE IF:   You have pain that radiates from your back into your legs.   You develop new bowel or bladder control problems.   You have unusual weakness or numbness in your arms or legs.   You develop nausea or vomiting.   You develop abdominal pain.   You feel faint.  Document Released: 01/08/2005 Document Revised: 12/28/2010 Document Reviewed: 05/29/2010 [employer] [facility] [employer] Patient Information 2012  [place], [place].

## 2011-04-06 NOTE — ED Notes (Signed)
Assisted patient out to vehicle via wheelchair.

## 2011-04-06 NOTE — ED Notes (Signed)
Back pain left side x 1 week moves to left lower abd

## 2011-04-06 NOTE — ED Notes (Signed)
Kidney transplant  2010 rt side

## 2011-04-06 NOTE — ED Provider Notes (Signed)
History     CSN: [id number]  Arrival date & time 04/06/11  1358   None     Chief Complaint  Patient presents with  . Back Pain    (Consider location/radiation/quality/duration/timing/severity/associated sxs/prior treatment) Patient is a 61 y.o. female presenting with back pain. The history is provided by the patient.  Back Pain  The current episode started more than 1 week ago. The problem occurs daily. The pain is associated with no known injury. The pain is present in the lumbar spine. The quality of the pain is described as aching. The pain is mild. The symptoms are aggravated by bending and certain positions. The pain is worse during the night. Pertinent negatives include no chest pain, no fever, no weight loss, no headaches, no dysuria, no pelvic pain, no leg pain, no paresthesias, no tingling and no weakness. She has tried nothing for the symptoms.    Past Medical History  Diagnosis Date  . Kidney transplant as cause of abnormal reaction or later complication   . Renal disorder     Past Surgical History  Procedure Date  . Nephrectomy transplanted organ     No family history on file.  History  Substance Use Topics  . Smoking status: Never Smoker   . Smokeless tobacco: Not on file  . Alcohol Use: Yes    OB History    Grav Para Term Preterm Abortions TAB SAB Ect Mult Living                  Review of Systems  Constitutional: Negative for fever and weight loss.  Cardiovascular: Negative for chest pain.  Genitourinary: Negative for dysuria and pelvic pain.  Musculoskeletal: Positive for back pain.  Neurological: Negative for tingling, weakness, headaches and paresthesias.  All other systems reviewed and are negative.    Allergies  Gentamycin; Penicillins; and Vancomycin  Home Medications   Current Outpatient Rx  Name Route Sig Dispense Refill  . ASPIRIN EC 81 MG PO TBEC Oral Take 81 mg by mouth daily.    . FUROSEMIDE 20 MG PO TABS Oral Take 20 mg by  mouth daily as needed. Excess edema    . LABETALOL HCL 200 MG PO TABS Oral Take 100 mg by mouth 2 (two) times daily.    Marland Kitchen MAGNESIUM OXIDE 400 MG PO TABS Oral Take 400 mg by mouth 2 (two) times daily.    Marland Kitchen MYCOPHENOLATE SODIUM 180 MG PO TBEC Oral Take 360 mg by mouth 2 (two) times daily.    Marland Kitchen RANITIDINE HCL 150 MG PO TABS Oral Take 150 mg by mouth at bedtime.    . SODIUM BICARBONATE 650 MG PO TABS Oral Take 650 mg by mouth 2 (two) times daily.    Marland Kitchen TACROLIMUS 1 MG PO CAPS Oral Take 3-4 mg by mouth 2 (two) times daily. 4 caps in am and 3 caps in pm.    . TIMOLOL HEMIHYDRATE 0.5 % OP SOLN Both Eyes Place 1 drop into both eyes 2 (two) times daily.    . WARFARIN SODIUM 2.5 MG PO TABS Oral Take 2.5 mg by mouth daily.      BP 132/62  Pulse 73  Temp 98.3 F (36.8 C)  Resp 14  SpO2 99%  Physical Exam  Nursing note and vitals reviewed. Constitutional: She is oriented to person, place, and time. She appears well-developed and well-nourished.  HENT:  Head: Normocephalic and atraumatic.  Eyes: Conjunctivae and EOM are normal. Pupils are equal, round, and reactive  to light.  Neck: Normal range of motion and phonation normal. Neck supple.  Cardiovascular: Normal rate, regular rhythm and intact distal pulses.   Pulmonary/Chest: Effort normal and breath sounds normal. She exhibits no tenderness.  Abdominal: Soft. She exhibits no distension. There is no tenderness. There is no guarding.  Genitourinary:       No costal vertebral angle tenderness  Musculoskeletal: Normal range of motion.       Mild left lumbar tenderness.  Neurological: She is alert and oriented to person, place, and time. She has normal strength. She exhibits normal muscle tone.  Skin: Skin is warm and dry.  Psychiatric: She has a normal mood and affect. Her behavior is normal. Judgment and thought content normal.    ED Course  Procedures (including critical care time)  Labs Reviewed  URINALYSIS, ROUTINE W REFLEX MICROSCOPIC -  Abnormal; Notable for the following:    Bilirubin Urine SMALL (*)    Ketones, ur 15 (*)    Leukocytes, UA SMALL (*)    All other components within normal limits  URINE MICROSCOPIC-ADD ON - Abnormal; Notable for the following:    Squamous Epithelial / LPF FEW (*)    Bacteria, UA FEW (*)    All other components within normal limits   No results found.   1. Back pain       MDM  Evaluation consistent with muscular skeletal pain. No indication for imaging or evaluation of blood or urine.  Plan: Home Medications- Tylenol for pain; Home Treatments- heat TID; Recommended follow up- PCP prn        Flint Melter, MD 04/06/11 1609

## 2011-04-17 ENCOUNTER — Emergency Department (HOSPITAL_COMMUNITY): Payer: Medicare Other

## 2011-04-17 ENCOUNTER — Inpatient Hospital Stay (HOSPITAL_COMMUNITY)
Admission: EM | Admit: 2011-04-17 | Discharge: 2011-04-20 | DRG: 809 | Disposition: A | Discharge status: Home / Self Care | Payer: Medicare Other | Attending: Internal Medicine | Admitting: Internal Medicine

## 2011-04-17 ENCOUNTER — Encounter (HOSPITAL_COMMUNITY): Payer: Self-pay | Admitting: *Deleted

## 2011-04-17 DIAGNOSIS — E871 Hypo-osmolality and hyponatremia: Secondary | ICD-10-CM | POA: Diagnosis present

## 2011-04-17 DIAGNOSIS — R509 Fever, unspecified: Secondary | ICD-10-CM

## 2011-04-17 DIAGNOSIS — N184 Chronic kidney disease, stage 4 (severe): Secondary | ICD-10-CM | POA: Diagnosis present

## 2011-04-17 DIAGNOSIS — R5081 Fever presenting with conditions classified elsewhere: Secondary | ICD-10-CM | POA: Diagnosis present

## 2011-04-17 DIAGNOSIS — Z7901 Long term (current) use of anticoagulants: Secondary | ICD-10-CM

## 2011-04-17 DIAGNOSIS — I4891 Unspecified atrial fibrillation: Secondary | ICD-10-CM | POA: Diagnosis present

## 2011-04-17 DIAGNOSIS — N179 Acute kidney failure, unspecified: Secondary | ICD-10-CM | POA: Diagnosis present

## 2011-04-17 DIAGNOSIS — N183 Chronic kidney disease, stage 3 unspecified: Secondary | ICD-10-CM | POA: Diagnosis present

## 2011-04-17 DIAGNOSIS — I129 Hypertensive chronic kidney disease with stage 1 through stage 4 chronic kidney disease, or unspecified chronic kidney disease: Secondary | ICD-10-CM | POA: Diagnosis present

## 2011-04-17 DIAGNOSIS — T8610 Unspecified complication of kidney transplant: Secondary | ICD-10-CM | POA: Diagnosis present

## 2011-04-17 DIAGNOSIS — M329 Systemic lupus erythematosus, unspecified: Secondary | ICD-10-CM | POA: Diagnosis present

## 2011-04-17 DIAGNOSIS — Z86718 Personal history of other venous thrombosis and embolism: Secondary | ICD-10-CM

## 2011-04-17 DIAGNOSIS — D709 Neutropenia, unspecified: Principal | ICD-10-CM | POA: Diagnosis present

## 2011-04-17 DIAGNOSIS — D6859 Other primary thrombophilia: Secondary | ICD-10-CM | POA: Diagnosis present

## 2011-04-17 DIAGNOSIS — Z79899 Other long term (current) drug therapy: Secondary | ICD-10-CM

## 2011-04-17 DIAGNOSIS — Z940 Kidney transplant status: Secondary | ICD-10-CM

## 2011-04-17 DIAGNOSIS — Z7982 Long term (current) use of aspirin: Secondary | ICD-10-CM

## 2011-04-17 LAB — PRO B NATRIURETIC PEPTIDE: Pro B Natriuretic peptide (BNP): 3723 pg/mL — ABNORMAL HIGH (ref 0–125)

## 2011-04-17 LAB — DIFFERENTIAL
Eosinophils Absolute: 0.1 10*3/uL (ref 0.0–0.7)
Eosinophils Relative: 4 % (ref 0–5)
Lymphs Abs: 0.8 10*3/uL (ref 0.7–4.0)
Monocytes Absolute: 0.4 10*3/uL (ref 0.1–1.0)

## 2011-04-17 LAB — URINALYSIS, ROUTINE W REFLEX MICROSCOPIC
Nitrite: NEGATIVE
Specific Gravity, Urine: 1.022 (ref 1.005–1.030)
Urobilinogen, UA: 1 mg/dL (ref 0.0–1.0)
pH: 5.5 (ref 5.0–8.0)

## 2011-04-17 LAB — CBC
HCT: 33.5 % — ABNORMAL LOW (ref 36.0–46.0)
MCH: 26.3 pg (ref 26.0–34.0)
MCV: 80 fL (ref 78.0–100.0)
Platelets: 168 10*3/uL (ref 150–400)
RBC: 4.19 MIL/uL (ref 3.87–5.11)
RDW: 13.8 % (ref 11.5–15.5)

## 2011-04-17 LAB — COMPREHENSIVE METABOLIC PANEL
ALT: 16 U/L (ref 0–35)
Calcium: 10 mg/dL (ref 8.4–10.5)
Creatinine, Ser: 1.59 mg/dL — ABNORMAL HIGH (ref 0.50–1.10)
GFR calc Af Amer: 39 mL/min — ABNORMAL LOW (ref >90)
GFR calc non Af Amer: 34 mL/min — ABNORMAL LOW (ref >90)
Glucose, Bld: 99 mg/dL (ref 70–99)
Sodium: 128 mEq/L — ABNORMAL LOW (ref 135–145)
Total Protein: 6.6 g/dL (ref 6.0–8.3)

## 2011-04-17 LAB — PROTIME-INR
INR: 1.86 — ABNORMAL HIGH (ref 0.00–1.49)
Prothrombin Time: 21.8 seconds — ABNORMAL HIGH (ref 11.6–15.2)

## 2011-04-17 LAB — PROCALCITONIN: Procalcitonin: 0.12 ng/mL

## 2011-04-17 LAB — URINE MICROSCOPIC-ADD ON

## 2011-04-17 LAB — CARDIAC PANEL(CRET KIN+CKTOT+MB+TROPI): Troponin I: <0.3 ng/mL (ref <0.30)

## 2011-04-17 MED ORDER — ACETAMINOPHEN 325 MG PO TABS
650.0000 mg | ORAL_TABLET | Freq: Once | ORAL | Status: AC
  Administered 2011-04-17: 650 mg via ORAL
  Filled 2011-04-17: qty 2

## 2011-04-17 MED ORDER — TIMOLOL HEMIHYDRATE 0.5 % OP SOLN: 1.0000 [drp] | Freq: Two times a day (BID) | OPHTHALMIC | Status: DC

## 2011-04-17 MED ORDER — SODIUM CHLORIDE 0.9 % IV SOLN
INTRAVENOUS | Status: DC
  Administered 2011-04-17: 23:00:00 via INTRAVENOUS

## 2011-04-17 MED ORDER — SODIUM CHLORIDE 0.9 % IV SOLN
INTRAVENOUS | Status: AC
  Administered 2011-04-17: 21:00:00 via INTRAVENOUS

## 2011-04-17 MED ORDER — TACROLIMUS 1 MG PO CAPS
4.0000 mg | ORAL_CAPSULE | Freq: Every day | ORAL | Status: DC
Start: 2011-04-18 — End: 2011-04-20
  Administered 2011-04-18 – 2011-04-20 (×3): 4 mg via ORAL
  Filled 2011-04-17 (×3): qty 4

## 2011-04-17 MED ORDER — LABETALOL HCL 100 MG PO TABS
100.0000 mg | ORAL_TABLET | Freq: Two times a day (BID) | ORAL | Status: DC
  Administered 2011-04-18 – 2011-04-20 (×6): 100 mg via ORAL
  Filled 2011-04-17 (×7): qty 1

## 2011-04-17 MED ORDER — MYCOPHENOLATE SODIUM 180 MG PO TBEC
360.0000 mg | DELAYED_RELEASE_TABLET | Freq: Two times a day (BID) | ORAL | Status: DC
  Administered 2011-04-18 – 2011-04-20 (×6): 360 mg via ORAL
  Filled 2011-04-17 (×7): qty 2

## 2011-04-17 MED ORDER — ACETAMINOPHEN 325 MG PO TABS: 650.0000 mg | ORAL_TABLET | Freq: Four times a day (QID) | ORAL | Status: DC | PRN

## 2011-04-17 MED ORDER — ASPIRIN EC 81 MG PO TBEC
81.0000 mg | DELAYED_RELEASE_TABLET | Freq: Every day | ORAL | Status: DC
  Administered 2011-04-18 – 2011-04-20 (×3): 81 mg via ORAL
  Filled 2011-04-17 (×5): qty 1

## 2011-04-17 MED ORDER — DEXTROSE 5 % IV SOLN
2.0000 g | INTRAVENOUS | Status: AC
  Administered 2011-04-18: 2 g via INTRAVENOUS
  Filled 2011-04-17: qty 2

## 2011-04-17 MED ORDER — ACETAMINOPHEN 650 MG RE SUPP: 650.0000 mg | Freq: Four times a day (QID) | RECTAL | Status: DC | PRN

## 2011-04-17 MED ORDER — ONDANSETRON HCL 4 MG/2ML IJ SOLN: 4.0000 mg | Freq: Four times a day (QID) | INTRAMUSCULAR | Status: DC | PRN

## 2011-04-17 MED ORDER — WARFARIN - PHYSICIAN DOSING INPATIENT: Freq: Every day | Status: DC

## 2011-04-17 MED ORDER — SODIUM BICARBONATE 650 MG PO TABS
650.0000 mg | ORAL_TABLET | Freq: Two times a day (BID) | ORAL | Status: DC
  Administered 2011-04-18 – 2011-04-20 (×6): 650 mg via ORAL
  Filled 2011-04-17 (×7): qty 1

## 2011-04-17 MED ORDER — FAMOTIDINE 20 MG PO TABS
20.0000 mg | ORAL_TABLET | Freq: Every day | ORAL | Status: DC
  Administered 2011-04-18 – 2011-04-20 (×2): 20 mg via ORAL
  Filled 2011-04-17 (×5): qty 1

## 2011-04-17 MED ORDER — LINEZOLID 2 MG/ML IV SOLN
600.0000 mg | INTRAVENOUS | Status: AC
  Administered 2011-04-17: 600 mg via INTRAVENOUS
  Filled 2011-04-17: qty 300

## 2011-04-17 MED ORDER — TIMOLOL MALEATE 0.5 % OP SOLN
1.0000 [drp] | Freq: Two times a day (BID) | OPHTHALMIC | Status: DC
  Administered 2011-04-17 – 2011-04-20 (×6): 1 [drp] via OPHTHALMIC
  Filled 2011-04-17: qty 5

## 2011-04-17 MED ORDER — MAGNESIUM OXIDE 400 MG PO TABS
400.0000 mg | ORAL_TABLET | Freq: Two times a day (BID) | ORAL | Status: DC
  Administered 2011-04-18 – 2011-04-20 (×6): 400 mg via ORAL
  Filled 2011-04-17 (×7): qty 1

## 2011-04-17 MED ORDER — ONDANSETRON HCL 4 MG/2ML IJ SOLN: 4.0000 mg | Freq: Three times a day (TID) | INTRAMUSCULAR | Status: AC | PRN

## 2011-04-17 MED ORDER — HYDROCODONE-ACETAMINOPHEN 5-325 MG PO TABS: 1.0000 | ORAL_TABLET | ORAL | Status: DC | PRN

## 2011-04-17 MED ORDER — TACROLIMUS 1 MG PO CAPS
3.0000 mg | ORAL_CAPSULE | Freq: Every day | ORAL | Status: DC
  Administered 2011-04-17 – 2011-04-19 (×3): 3 mg via ORAL
  Filled 2011-04-17 (×4): qty 3

## 2011-04-17 MED ORDER — WARFARIN SODIUM 2.5 MG PO TABS
2.5000 mg | ORAL_TABLET | Freq: Every day | ORAL | Status: DC
  Administered 2011-04-18: 2.5 mg via ORAL
  Filled 2011-04-17 (×2): qty 1

## 2011-04-17 MED ORDER — ONDANSETRON HCL 4 MG PO TABS: 4.0000 mg | ORAL_TABLET | Freq: Four times a day (QID) | ORAL | Status: DC | PRN

## 2011-04-17 MED ORDER — TUBERCULIN PPD 5 UNIT/0.1ML ID SOLN
5.0000 [IU] | Freq: Once | INTRADERMAL | Status: DC
  Filled 2011-04-17 (×2): qty 0.1

## 2011-04-17 MED ORDER — SODIUM CHLORIDE 0.9 % IJ SOLN
3.0000 mL | Freq: Two times a day (BID) | INTRAMUSCULAR | Status: DC
  Administered 2011-04-18 – 2011-04-20 (×3): 3 mL via INTRAVENOUS

## 2011-04-17 NOTE — H&P (Addendum)
PCP:  Sheila Oats, MD, MD   DOA:  04/17/2011  4:51 PM  Chief Complaint:  Fever  HPI: Pt is 61 yo female with history of kidney transplant and on immunosuppressive therapy who presents to [facility] with main concerns of persistent fevers as high as 85 F that initially started 2-3 days prior to admission, associated with chills, and diaphoresis, night sweats. She denies similar episodes in the past and reports no specific abdominal or urinary concerns. She denies chest pain, shortness of breath, no cough, no focal neurologic weakness, no headaches or visual changes. She denies recent changes in medications, no recent sick contacts or exposures. No specific aggravating or alleviating factors.   Assessment/Plan  Principal Problem:   *NEUTROPENIC FEVER - likely secondary to immunosuppresants - CXR and UA are unremarkable for acute findings - will admit the patient to telemetry unit and monitor vitals per floor protocol - will start supportive care with IVF, antiemetics, analgesia and tylenol PRN for fever control - will follow upon admission labs and will check procalcitonin, blood cultures, TSH, urine cultures - start broad spectrum cefepime and zyvox (ordered also by ED)  Active Problems:  Night sweats - unclear etiology - considering her immunosuppressed status, some concern for TB - Obtain TB skin test - read 3/28  Kidney transplant as cause of abnormal reaction or later complication - on immunosuppressive therapy - will continue the same medication regimen   Acute on chronic kidney disease, stage III - now likely pre-renal etiology but creatinine appears to be stable compared to previous values - will provide gentle hydration - will hold lasix   Hyponatremia - pre-renal etiology - will hydrate as noted above and will obtain BMP in AM  DVT Prophylaxis - on Coumadin  Code Status - Full  Education  - test results and diagnostic studies were discussed with patient  -  patient verbalized the understanding - questions were answered at the bedside and contact information was provided for additional questions or concerns - patient does not wasn't her condition to be discussed with anyone including but not limited to family. Daughter was at bedside and patient openly expressed she can not be informed of her condition.    Allergies: Allergies  Allergen Reactions  . Gentamycin (Gentamicin Sulfate) Itching and Swelling  . Penicillins Itching  . Vancomycin Itching and Swelling    Prior to Admission medications   Medication Sig Start Date End Date Taking? Authorizing Provider  aspirin EC 81 MG tablet Take 81 mg by mouth daily.   Yes Historical Provider, MD  furosemide (LASIX) 20 MG tablet Take 20 mg by mouth daily as needed. Excess edema   Yes Historical Provider, MD  labetalol (NORMODYNE) 200 MG tablet Take 100 mg by mouth 2 (two) times daily.   Yes Historical Provider, MD  magnesium oxide (MAG-OX) 400 MG tablet Take 400 mg by mouth 2 (two) times daily.   Yes Historical Provider, MD  mycophenolate (MYFORTIC) 180 MG EC tablet Take 360 mg by mouth 2 (two) times daily.   Yes Historical Provider, MD  ranitidine (ZANTAC) 150 MG tablet Take 150 mg by mouth at bedtime.   Yes Historical Provider, MD  sodium bicarbonate 650 MG tablet Take 650 mg by mouth 2 (two) times daily.   Yes Historical Provider, MD  tacrolimus (PROGRAF) 1 MG capsule Take 3-4 mg by mouth 2 (two) times daily. 4 caps in am and 3 caps in pm.   Yes Historical Provider, MD  timolol (BETIMOL) 0.5 % ophthalmic  solution Place 1 drop into both eyes 2 (two) times daily.   Yes Historical Provider, MD  warfarin (COUMADIN) 2.5 MG tablet Take 2.5 mg by mouth daily.   Yes Historical Provider, MD    Past Medical History  Diagnosis Date  . Kidney transplant as cause of abnormal reaction or later complication   . Renal disorder     Past Surgical History  Procedure Date  . Nephrectomy transplanted organ      Social History:  reports that she has never smoked. She does not have any smokeless tobacco history on file. She reports that she does not drink alcohol or use illicit drugs.  History reviewed. No pertinent family history.  Review of Systems:  Constitutional: Denies appetite change and fatigue; (+) fever.  HEENT: Denies photophobia, eye pain, redness, hearing loss, ear pain, congestion, sore throat, rhinorrhea, sneezing, mouth sores, trouble swallowing, neck pain, neck stiffness and tinnitus.   Respiratory: Denies SOB, DOE, cough, chest tightness,  and wheezing; (+) night sweats.   Cardiovascular: Denies chest pain, palpitations and leg swelling.  Gastrointestinal: Denies nausea, vomiting, abdominal pain, diarrhea, constipation, blood in stool and abdominal distention.  Genitourinary: Denies dysuria, urgency, frequency, hematuria, flank pain and difficulty urinating.  Musculoskeletal: Denies myalgias, back pain, joint swelling, arthralgias and gait problem.  Skin: Denies pallor, rash and wound.  Neurological: Denies dizziness, seizures, syncope, weakness, light-headedness, numbness and headaches.  Hematological: Denies adenopathy. Easy bruising, personal or family bleeding history  Psychiatric/Behavioral: Denies suicidal ideation, mood changes, confusion, nervousness, sleep disturbance and agitation  Physical Exam:  Filed Vitals:   04/17/11 1652 04/17/11 1705 04/17/11 2004  BP: 130/65  141/80  Pulse: 82  80  Temp: 99.8 F (37.7 C)  101.1 F (38.4 C)  TempSrc: Oral  Oral  Resp: 18  20  Height:  5\' 1"  (1.549 m)   Weight:  78.019 kg (172 lb)   SpO2: 99%  99%    Constitutional: Vital signs reviewed.  Patient is in no acute distress and cooperative with exam. Alert and oriented x3.  Head: Normocephalic and atraumatic Ear: TM normal bilaterally Mouth: no erythema or exudates, dry mucous membranes Eyes: PERRL, EOMI, conjunctivae normal, No scleral icterus.  Neck: Supple, Trachea  midline normal ROM, No JVD, mass, thyromegaly, or carotid bruit present.  Cardiovascular: RRR, S1 normal, S2 normal, (+4/6) SEM radiating to carotids Pulmonary/Chest: CTAB, no wheezes, rales, or rhonchi Abdominal: Soft. Non-tender, non-distended, bowel sounds are normal, no masses, organomegaly, or guarding present.  GU: no CVA tenderness Musculoskeletal: No joint deformities, erythema, or stiffness, ROM full and no nontender Ext: no edema and no cyanosis, pulses palpable bilaterally (DP and PT) Hematology: no cervical, inginal, or axillary adenopathy.  Neurological: A&O x3, Strenght is normal and symmetric bilaterally, cranial nerve II-XII are grossly intact, no focal motor deficit, sensory intact to light touch bilaterally.  Skin: Warm, dry and intact. No rash, cyanosis, or clubbing.  Psychiatric: Normal mood and affect. speech and behavior is normal. Judgment and thought content normal. Cognition and memory are normal.   Labs on Admission:  Results for orders placed during the hospital encounter of 04/17/11 (from the past 48 hour(s))  CBC     Status: Abnormal   Collection Time   04/17/11  5:19 PM      Component Value Range Comment   WBC 3.8 (*) 4.0 - 10.5 (K/uL)    RBC 4.19  3.87 - 5.11 (MIL/uL)    Hemoglobin 11.0 (*) 12.0 - 15.0 (g/dL)  HCT 33.5 (*) 36.0 - 46.0 (%)    MCV 80.0  78.0 - 100.0 (fL)    MCH 26.3  26.0 - 34.0 (pg)    MCHC 32.8  30.0 - 36.0 (g/dL)    RDW 09.8  11.9 - 14.7 (%)    Platelets 168  150 - 400 (K/uL)   DIFFERENTIAL     Status: Normal   Collection Time   04/17/11  5:19 PM      Component Value Range Comment   Neutrophils Relative 64  43 - 77 (%)    Neutro Abs 2.4  1.7 - 7.7 (K/uL)    Lymphocytes Relative 21  12 - 46 (%)    Lymphs Abs 0.8  0.7 - 4.0 (K/uL)    Monocytes Relative 11  3 - 12 (%)    Monocytes Absolute 0.4  0.1 - 1.0 (K/uL)    Eosinophils Relative 4  0 - 5 (%)    Eosinophils Absolute 0.1  0.0 - 0.7 (K/uL)    Basophils Relative 1  0 - 1 (%)     Basophils Absolute 0.0  0.0 - 0.1 (K/uL)   COMPREHENSIVE METABOLIC PANEL     Status: Abnormal   Collection Time   04/17/11  5:19 PM      Component Value Range Comment   Sodium 128 (*) 135 - 145 (mEq/L)    Potassium 5.1  3.5 - 5.1 (mEq/L)    Chloride 99  96 - 112 (mEq/L)    CO2 20  19 - 32 (mEq/L)    Glucose, Bld 99  70 - 99 (mg/dL)    BUN 21  6 - 23 (mg/dL)    Creatinine, Ser 8.29 (*) 0.50 - 1.10 (mg/dL)    Calcium 56.2  8.4 - 10.5 (mg/dL)    Total Protein 6.6  6.0 - 8.3 (g/dL)    Albumin 3.6  3.5 - 5.2 (g/dL)    AST 35  0 - 37 (U/L)    ALT 16  0 - 35 (U/L)    Alkaline Phosphatase 99  39 - 117 (U/L)    Total Bilirubin 0.4  0.3 - 1.2 (mg/dL)    GFR calc non Af Amer 34 (*) >90 (mL/min)    GFR calc Af Amer 39 (*) >90 (mL/min)   URINALYSIS, ROUTINE W REFLEX MICROSCOPIC     Status: Abnormal   Collection Time   04/17/11  6:49 PM      Component Value Range Comment   Color, Urine YELLOW  YELLOW     APPearance CLEAR  CLEAR     Specific Gravity, Urine 1.022  1.005 - 1.030     pH 5.5  5.0 - 8.0     Glucose, UA NEGATIVE  NEGATIVE (mg/dL)    Hgb urine dipstick NEGATIVE  NEGATIVE     Bilirubin Urine SMALL (*) NEGATIVE     Ketones, ur NEGATIVE  NEGATIVE (mg/dL)    Protein, ur NEGATIVE  NEGATIVE (mg/dL)    Urobilinogen, UA 1.0  0.0 - 1.0 (mg/dL)    Nitrite NEGATIVE  NEGATIVE     Leukocytes, UA SMALL (*) NEGATIVE    URINE MICROSCOPIC-ADD ON     Status: Normal   Collection Time   04/17/11  6:49 PM      Component Value Range Comment   Squamous Epithelial / LPF RARE  RARE     WBC, UA 0-2  <3 (WBC/hpf)    RBC / HPF 0-2  <3 (RBC/hpf)    Bacteria, UA  RARE  RARE      Radiological Exams on Admission: CXR: 04/17/2011 Stable left basilar scarring. No active disease.  Time Spent on Admission: Over 30 minutes  Traycen Goyer 04/17/2011, 8:59 PM  [facility] Pager # [phone] Main Office # [phone]

## 2011-04-17 NOTE — ED Notes (Signed)
Patient transported to X-ray 

## 2011-04-17 NOTE — ED Notes (Signed)
MD aware of pt's fever.

## 2011-04-17 NOTE — ED Notes (Signed)
Patient was seen here 2 weeks ago.  Today she is having fever 101.1, night sweats.  She has hx of kidney transplant.  Patient was sent over by Dr Caryn Section for further evaluation. Patient has occassional cough.  She denies pain when voiding

## 2011-04-17 NOTE — ED Provider Notes (Addendum)
History     CSN: [id number]  Arrival date & time 04/17/11  1634   First MD Initiated Contact with Patient 04/17/11 1850      Chief Complaint  Patient presents with  . Fever  . Excessive Sweating    (Consider location/radiation/quality/duration/timing/severity/associated sxs/prior treatment) HPI Comments: Patient has a history of a kidney transplant and is on immunosuppressive therapy and over the last 2-3 days she's had fevers as high as 101 with night sweats.  She denies any dysuria, cough, shortness of breath, abdominal pain, vomiting or change in bowel movements.  Patient does note diarrhea but states she's had persistent diarrhea problems since she had her gallbladder removed so this is not a change for her.  Patient to contact her nephrologist Dr. Caryn Section who recommended she come in for further evaluation.  Patient also does not have many urinary catheters her IV lines at this time.  Patient is a 61 y.o. female presenting with fever. The history is provided by the patient. No language interpreter was used.  Fever Primary symptoms of the febrile illness include fever and diarrhea. Primary symptoms do not include fatigue, visual change, headaches, cough, wheezing, shortness of breath, abdominal pain, nausea, vomiting, dysuria, altered mental status, myalgias, arthralgias or rash. The current episode started 3 to 5 days ago. This is a new problem.    Past Medical History  Diagnosis Date  . Kidney transplant as cause of abnormal reaction or later complication   . Renal disorder     Past Surgical History  Procedure Date  . Nephrectomy transplanted organ     History reviewed. No pertinent family history.  History  Substance Use Topics  . Smoking status: Never Smoker   . Smokeless tobacco: Not on file  . Alcohol Use: No    OB History    Grav Para Term Preterm Abortions TAB SAB Ect Mult Living                  Review of Systems  Constitutional: Positive for fever. Negative  for chills and fatigue.  HENT: Negative.   Eyes: Negative.  Negative for discharge and redness.  Respiratory: Negative.  Negative for cough, shortness of breath and wheezing.   Cardiovascular: Negative.  Negative for chest pain.  Gastrointestinal: Positive for diarrhea. Negative for nausea, vomiting and abdominal pain.  Genitourinary: Negative.  Negative for dysuria and vaginal discharge.  Musculoskeletal: Negative.  Negative for myalgias, back pain and arthralgias.  Skin: Negative.  Negative for color change and rash.  Neurological: Negative.  Negative for syncope and headaches.  Hematological: Negative.  Negative for adenopathy.  Psychiatric/Behavioral: Negative.  Negative for confusion and altered mental status.  All other systems reviewed and are negative.    Allergies  Gentamycin; Penicillins; and Vancomycin  Home Medications   Current Outpatient Rx  Name Route Sig Dispense Refill  . ASPIRIN EC 81 MG PO TBEC Oral Take 81 mg by mouth daily.    . FUROSEMIDE 20 MG PO TABS Oral Take 20 mg by mouth daily as needed. Excess edema    . LABETALOL HCL 200 MG PO TABS Oral Take 100 mg by mouth 2 (two) times daily.    Marland Kitchen MAGNESIUM OXIDE 400 MG PO TABS Oral Take 400 mg by mouth 2 (two) times daily.    Marland Kitchen MYCOPHENOLATE SODIUM 180 MG PO TBEC Oral Take 360 mg by mouth 2 (two) times daily.    Marland Kitchen RANITIDINE HCL 150 MG PO TABS Oral Take 150 mg by mouth  at bedtime.    . SODIUM BICARBONATE 650 MG PO TABS Oral Take 650 mg by mouth 2 (two) times daily.    Marland Kitchen TACROLIMUS 1 MG PO CAPS Oral Take 3-4 mg by mouth 2 (two) times daily. 4 caps in am and 3 caps in pm.    . TIMOLOL HEMIHYDRATE 0.5 % OP SOLN Both Eyes Place 1 drop into both eyes 2 (two) times daily.    . WARFARIN SODIUM 2.5 MG PO TABS Oral Take 2.5 mg by mouth daily.      BP 130/65  Pulse 82  Temp(Src) 99.8 F (37.7 C) (Oral)  Resp 18  Ht 5\' 1"  (1.549 m)  Wt 172 lb (78.019 kg)  BMI 32.50 kg/m2  SpO2 99%  Physical Exam  Nursing note and  vitals reviewed. Constitutional: She is oriented to person, place, and time. She appears well-developed and well-nourished.  Non-toxic appearance. She does not have a sickly appearance.  HENT:  Head: Normocephalic and atraumatic.  Eyes: Conjunctivae, EOM and lids are normal. Pupils are equal, round, and reactive to light. No scleral icterus.  Neck: Trachea normal and normal range of motion. Neck supple.  Cardiovascular: Normal rate, regular rhythm and normal heart sounds.   Pulmonary/Chest: Effort normal and breath sounds normal. No respiratory distress. She has no wheezes. She has no rales.  Abdominal: Soft. Normal appearance. There is no tenderness. There is no rebound, no guarding and no CVA tenderness.  Musculoskeletal: Normal range of motion.  Neurological: She is alert and oriented to person, place, and time. She has normal strength.  Skin: Skin is warm, dry and intact. No rash noted.  Psychiatric: She has a normal mood and affect. Her behavior is normal. Judgment and thought content normal.    ED Course  Procedures (including critical care time) Results for orders placed during the hospital encounter of 04/17/11  CBC      Component Value Range   WBC 3.8 (*) 4.0 - 10.5 (K/uL)   RBC 4.19  3.87 - 5.11 (MIL/uL)   Hemoglobin 11.0 (*) 12.0 - 15.0 (g/dL)   HCT 16.1 (*) 09.6 - 46.0 (%)   MCV 80.0  78.0 - 100.0 (fL)   MCH 26.3  26.0 - 34.0 (pg)   MCHC 32.8  30.0 - 36.0 (g/dL)   RDW 04.5  40.9 - 81.1 (%)   Platelets 168  150 - 400 (K/uL)  DIFFERENTIAL      Component Value Range   Neutrophils Relative 64  43 - 77 (%)   Neutro Abs 2.4  1.7 - 7.7 (K/uL)   Lymphocytes Relative 21  12 - 46 (%)   Lymphs Abs 0.8  0.7 - 4.0 (K/uL)   Monocytes Relative 11  3 - 12 (%)   Monocytes Absolute 0.4  0.1 - 1.0 (K/uL)   Eosinophils Relative 4  0 - 5 (%)   Eosinophils Absolute 0.1  0.0 - 0.7 (K/uL)   Basophils Relative 1  0 - 1 (%)   Basophils Absolute 0.0  0.0 - 0.1 (K/uL)  COMPREHENSIVE  METABOLIC PANEL      Component Value Range   Sodium 128 (*) 135 - 145 (mEq/L)   Potassium 5.1  3.5 - 5.1 (mEq/L)   Chloride 99  96 - 112 (mEq/L)   CO2 20  19 - 32 (mEq/L)   Glucose, Bld 99  70 - 99 (mg/dL)   BUN 21  6 - 23 (mg/dL)   Creatinine, Ser 9.14 (*) 0.50 - 1.10 (mg/dL)  Calcium 10.0  8.4 - 10.5 (mg/dL)   Total Protein 6.6  6.0 - 8.3 (g/dL)   Albumin 3.6  3.5 - 5.2 (g/dL)   AST 35  0 - 37 (U/L)   ALT 16  0 - 35 (U/L)   Alkaline Phosphatase 99  39 - 117 (U/L)   Total Bilirubin 0.4  0.3 - 1.2 (mg/dL)   GFR calc non Af Amer 34 (*) >90 (mL/min)   GFR calc Af Amer 39 (*) >90 (mL/min)  URINALYSIS, ROUTINE W REFLEX MICROSCOPIC      Component Value Range   Color, Urine YELLOW  YELLOW    APPearance CLEAR  CLEAR    Specific Gravity, Urine 1.022  1.005 - 1.030    pH 5.5  5.0 - 8.0    Glucose, UA NEGATIVE  NEGATIVE (mg/dL)   Hgb urine dipstick NEGATIVE  NEGATIVE    Bilirubin Urine SMALL (*) NEGATIVE    Ketones, ur NEGATIVE  NEGATIVE (mg/dL)   Protein, ur NEGATIVE  NEGATIVE (mg/dL)   Urobilinogen, UA 1.0  0.0 - 1.0 (mg/dL)   Nitrite NEGATIVE  NEGATIVE    Leukocytes, UA SMALL (*) NEGATIVE   URINE MICROSCOPIC-ADD ON      Component Value Range   Squamous Epithelial / LPF RARE  RARE    WBC, UA 0-2  <3 (WBC/hpf)   RBC / HPF 0-2  <3 (RBC/hpf)   Bacteria, UA RARE  RARE       MDM  The patient's fevers at home with night sweats and her immunosuppression patient will have a urinalysis and urine culture sent, blood cultures and a chest x-ray.  Patient does not have any specific symptoms on history they give me a source of infection.  The patient will likely need to come in for monitoring and observation overnight given her fevers and immunosuppressed status.        Nat Christen, MD 04/17/11 1904  Patient has fever here and without source will cover with broad spectrum antibiotics.  Pt will require admission for further f/u on her cultures.  Pt otherwise hemodynamically  stable.  Pt given linezolid and cefepime to cover MRSA and pseudomonas given pt's allergies to vanco, penicillins (itching) and gentamycin.    Nat Christen, MD 04/17/11 2027  Discussed with person answering phone for Dr. Elisabeth Pigeon for admission to [facility], tele bed.    Nat Christen, MD 04/17/11 2046

## 2011-04-18 ENCOUNTER — Other Ambulatory Visit: Payer: Self-pay

## 2011-04-18 LAB — GLUCOSE, CAPILLARY: Glucose-Capillary: 92 mg/dL (ref 70–99)

## 2011-04-18 LAB — CBC
Hemoglobin: 9.6 g/dL — ABNORMAL LOW (ref 12.0–15.0)
MCH: 27 pg (ref 26.0–34.0)
MCHC: 33.6 g/dL (ref 30.0–36.0)
Platelets: 153 10*3/uL (ref 150–400)
RDW: 13.9 % (ref 11.5–15.5)

## 2011-04-18 LAB — CARDIAC PANEL(CRET KIN+CKTOT+MB+TROPI)
CK, MB: 1.8 ng/mL (ref 0.3–4.0)
CK, MB: 2.3 ng/mL (ref 0.3–4.0)
Relative Index: INVALID (ref 0.0–2.5)
Total CK: 88 U/L (ref 7–177)
Troponin I: <0.3 ng/mL (ref <0.30)
Troponin I: <0.3 ng/mL (ref <0.30)

## 2011-04-18 LAB — URINALYSIS, ROUTINE W REFLEX MICROSCOPIC
Leukocytes, UA: NEGATIVE
Nitrite: NEGATIVE
Protein, ur: NEGATIVE mg/dL
Specific Gravity, Urine: 1.014 (ref 1.005–1.030)
Urobilinogen, UA: 0.2 mg/dL (ref 0.0–1.0)

## 2011-04-18 LAB — BASIC METABOLIC PANEL
BUN: 23 mg/dL (ref 6–23)
Calcium: 9.2 mg/dL (ref 8.4–10.5)
GFR calc Af Amer: 39 mL/min — ABNORMAL LOW (ref >90)
GFR calc non Af Amer: 33 mL/min — ABNORMAL LOW (ref >90)
Potassium: 4.1 mEq/L (ref 3.5–5.1)

## 2011-04-18 LAB — URINE CULTURE: Culture  Setup Time: 201303261954

## 2011-04-18 LAB — HEMOGLOBIN A1C: Hgb A1c MFr Bld: 5.9 % — ABNORMAL HIGH (ref <5.7)

## 2011-04-18 LAB — SEDIMENTATION RATE: Sed Rate: 25 mm/hr — ABNORMAL HIGH (ref 0–22)

## 2011-04-18 LAB — HIV ANTIBODY (ROUTINE TESTING W REFLEX): HIV: NONREACTIVE

## 2011-04-18 MED ORDER — DIPHENHYDRAMINE HCL 50 MG/ML IJ SOLN
INTRAMUSCULAR | Status: AC
  Filled 2011-04-18: qty 1

## 2011-04-18 MED ORDER — DEXTROSE 5 % IV SOLN
2.0000 g | Freq: Two times a day (BID) | INTRAVENOUS | Status: DC
  Administered 2011-04-18 – 2011-04-19 (×3): 2 g via INTRAVENOUS
  Filled 2011-04-18 (×4): qty 2

## 2011-04-18 MED ORDER — DIPHENHYDRAMINE HCL 25 MG PO CAPS
25.0000 mg | ORAL_CAPSULE | Freq: Once | ORAL | Status: AC
  Administered 2011-04-18: 25 mg via ORAL
  Filled 2011-04-18: qty 1

## 2011-04-18 NOTE — Progress Notes (Signed)
Utilization Review Completed.Debbie [name] T3/27/2013   

## 2011-04-18 NOTE — Progress Notes (Signed)
ANTIBIOTIC CONSULT NOTE - INITIAL  Pharmacy Consult for Cefepime Indication: Febrile neutropenia  Allergies  Allergen Reactions  . Gentamycin (Gentamicin Sulfate) Itching and Swelling  . Penicillins Itching  . Vancomycin Itching and Swelling    Patient Measurements: Height: 5\' 1"  (154.9 cm) Weight: 172 lb (78.019 kg) IBW/kg (Calculated) : 47.8  Adjusted Body Weight:    Vital Signs: Temp: 98.3 F (36.8 C) (03/27 0508) Temp src: Oral (03/27 0508) BP: 164/77 mmHg (03/27 0940) Pulse Rate: 74  (03/27 0940) Intake/Output from previous day: 03/26 0701 - 03/27 0700 In: 486.7 [P.O.:120; I.V.:366.7] Out: -  Intake/Output from this shift: Total I/O In: 3 [I.V.:3] Out: -   Labs:  Basename 04/18/11 0500 04/17/11 1719  WBC 3.2* 3.8*  HGB 9.6* 11.0*  PLT 153 168  LABCREA -- --  CREATININE 1.62* 1.59*   Estimated Creatinine Clearance: 34.5 ml/min (by C-G formula based on Cr of 1.62). No results found for this basename: VANCOTROUGH:2,VANCOPEAK:2,VANCORANDOM:2,GENTTROUGH:2,GENTPEAK:2,GENTRANDOM:2,TOBRATROUGH:2,TOBRAPEAK:2,TOBRARND:2,AMIKACINPEAK:2,AMIKACINTROU:2,AMIKACIN:2, in the last 72 hours   Microbiology: No results found for this or any previous visit (from the past 720 hour(s)).  Medical History: Past Medical History  Diagnosis Date  . Kidney transplant as cause of abnormal reaction or later complication   . Renal disorder    Assessment:Persistent fevers, chills, diaphoresis, night sweats.    Coumadin per MD : INR 1.97 today on 2.5mg /d  JW:JXBJ 101.1. WBC 3.2. Patient allergic to PCN and Vanco. MD wants to continue Cefepime. Will dose for febrile neutropenia at 2g/12h. Airborne precautions until r/o TB.  GI/Nutriton: Famotidine once daily. Magox, NaBicarb tabs,  Renal:Kidney transplant. Meds: Mycophenolate, Tacrolimus  Plan:  Cefepime 2g IV q12h.  Caitlynne Harbeck S. Merilynn Finland, PharmD, BCPS Clinical Staff Pharmacist Pager [phone]  04/18/2011,10:49 AM

## 2011-04-18 NOTE — Progress Notes (Signed)
   CARE MANAGEMENT NOTE 04/18/2011  Patient:  Debbie [name], Debbie [name]   Account Number:  [id number]  Date Initiated:  04/18/2011  Documentation initiated by:  Donn Pierini  Subjective/Objective Assessment:   Pt admitted with neutropenic fever, hx kidney transplant r/o TB     Action/Plan:   PTA pt lived at home with spouse   Anticipated DC Date:  04/20/2011   Anticipated DC Plan:  HOME/SELF CARE      DC Planning Services  CM consult      Choice offered to / List presented to:             Status of service:  In process, will continue to follow Medicare Important Message given?   (If response is "NO", the following Medicare IM given date fields will be blank) Date Medicare IM given:   Date Additional Medicare IM given:    Discharge Disposition:    Per UR Regulation:    If discussed at Long Length of Stay Meetings, dates discussed:    Comments:  PCP- Fox (renal)  04/18/11- 1600- Donn Pierini RN, BSN [phone] Spoke with pt at bedside- per conversation pt states that she lives at home with spouse and is independent with ADLS- does not use any DME- pt uses [facility] to fill prescriptions and has medication benefits, she also gets some medicaitons from [place] [facility] [place]. Pt reports that she will have transportation home at time of discharge. No anticipated d/c needs, CM to follow.

## 2011-04-18 NOTE — Progress Notes (Signed)
Patient Debbie [name], 61 year old [place] American female suffers with neutropenic fever.  Patient feels some anxiety about being in isolation (airborn).  Patient enjoys the support of her husband and daughter.  She expressed appreciation for the Chaplain's provision of pastoral prayer, presence, and conversation.  I will follow-up as needed.

## 2011-04-18 NOTE — Progress Notes (Signed)
PATIENT DETAILS Name: Debbie [name] Age: 61 y.o. Sex: female Date of Birth: 1950-10-06 Admit Date: 04/17/2011 WUJ:[email or web address], MD, MD  Subjective: No major complaints  Objective: Vital signs in last 24 hours: Filed Vitals:   04/17/11 2058 04/18/11 0508 04/18/11 0940 04/18/11 1320  BP:  128/65 164/77 163/74  Pulse:  59 74 75  Temp:  98.3 F (36.8 C)  97.8 F (36.6 C)  TempSrc:  Oral  Oral  Resp:  20  20  Height:      Weight:      SpO2: 99% 99%  97%    Weight change:   Body mass index is 32.50 kg/(m^2).  Intake/Output from previous day:  Intake/Output Summary (Last 24 hours) at 04/18/11 1448 Last data filed at 04/18/11 1300  Gross per 24 hour  Intake 727.67 ml  Output      0 ml  Net 727.67 ml    PHYSICAL EXAM: Gen Exam: Awake and alert with clear speech.   Neck: Supple, No JVD.   Chest: B/L Clear.   CVS: S1 S2 Regular, no murmurs.  Abdomen: soft, BS +, non tender, non distended.  Extremities: no edema, lower extremities warm to touch. Neurologic: Non Focal.   Skin: No Rash.  Wounds: N/A.    CONSULTS:  None  LAB RESULTS: CBC  Lab 04/18/11 0500 04/17/11 1719  WBC 3.2* 3.8*  HGB 9.6* 11.0*  HCT 28.6* 33.5*  PLT 153 168  MCV 80.3 80.0  MCH 27.0 26.3  MCHC 33.6 32.8  RDW 13.9 13.8  LYMPHSABS -- 0.8  MONOABS -- 0.4  EOSABS -- 0.1  BASOSABS -- 0.0  BANDABS -- --    Chemistries   Lab 04/18/11 0500 04/17/11 2135 04/17/11 1719  NA 130* -- 128*  K 4.1 -- 5.1  CL 101 -- 99  CO2 19 -- 20  GLUCOSE 98 -- 99  BUN 23 -- 21  CREATININE 1.62* -- 1.59*  CALCIUM 9.2 -- 10.0  MG -- 1.6 --    GFR Estimated Creatinine Clearance: 34.5 ml/min (by C-G formula based on Cr of 1.62).  Coagulation profile  Lab 04/18/11 0500 04/17/11 2135  INR 1.97* 1.86*  PROTIME -- --    Cardiac Enzymes  Lab 04/18/11 1335 04/18/11 0500 04/17/11 2134  CKMB 2.3 1.8 1.8  TROPONINI <0.30 <0.30 <0.30  MYOGLOBIN -- -- --    No components found with this  basename: POCBNP:3 No results found for this basename: DDIMER:2 in the last 72 hours  Basename 04/17/11 2135  HGBA1C 5.9*   No results found for this basename: CHOL:2,HDL:2,LDLCALC:2,TRIG:2,CHOLHDL:2,LDLDIRECT:2 in the last 72 hours  Basename 04/17/11 2135  TSH 4.691*  T4TOTAL --  T3FREE --  THYROIDAB --   No results found for this basename: VITAMINB12:2,FOLATE:2,FERRITIN:2,TIBC:2,IRON:2,RETICCTPCT:2 in the last 72 hours No results found for this basename: LIPASE:2,AMYLASE:2 in the last 72 hours  Urine Studies No results found for this basename: UACOL:2,UAPR:2,USPG:2,UPH:2,UTP:2,UGL:2,UKET:2,UBIL:2,UHGB:2,UNIT:2,UROB:2,ULEU:2,UEPI:2,UWBC:2,URBC:2,UBAC:2,CAST:2,CRYS:2,UCOM:2,BILUA:2 in the last 72 hours  MICROBIOLOGY: No results found for this or any previous visit (from the past 240 hour(s)).  RADIOLOGY STUDIES/RESULTS: Dg Chest 2 View  04/17/2011  *RADIOLOGY REPORT*  Clinical Data: Fever.  Immunosuppression.  Renal transplant patient.  CHEST - 2 VIEW  Comparison: 12/23/2008  Findings: Mild scarring in the left lung base is stable.  Both lungs are clear.  No evidence of pleural effusion.  Heart size is stable within normal limits.  No mass or lymphadenopathy identified.  IMPRESSION: Stable left basilar scarring.  No active disease.  Original Report  Authenticated By: Danae Orleans, M.D.    MEDICATIONS: Scheduled Meds:   . sodium chloride   Intravenous STAT  . acetaminophen  650 mg Oral Once  . aspirin EC  81 mg Oral Daily  . ceFEPime (MAXIPIME) IV  2 g Intravenous To Major  . ceFEPime (MAXIPIME) IV  2 g Intravenous Q12H  . diphenhydrAMINE      . diphenhydrAMINE  25 mg Oral Once  . famotidine  20 mg Oral Daily  . labetalol  100 mg Oral BID  . linezolid  600 mg Intravenous To Major  . magnesium oxide  400 mg Oral BID  . mycophenolate  360 mg Oral BID  . sodium bicarbonate  650 mg Oral BID  . sodium chloride  3 mL Intravenous Q12H  . tacrolimus  3 mg Oral QHS  . tacrolimus   4 mg Oral Daily  . timolol  1 drop Both Eyes BID  . tuberculin  5 Units Intradermal Once  . warfarin  2.5 mg Oral q1800  . Warfarin - Physician Dosing Inpatient   Does not apply q1800  . DISCONTD: timolol  1 drop Both Eyes BID   Continuous Infusions:   . DISCONTD: sodium chloride 50 mL/hr at 04/17/11 2240   PRN Meds:.acetaminophen, acetaminophen, HYDROcodone-acetaminophen, ondansetron (ZOFRAN) IV, ondansetron (ZOFRAN) IV, ondansetron  Antibiotics: Anti-infectives     Start     Dose/Rate Route Frequency Ordered Stop   04/18/11 1000   ceFEPIme (MAXIPIME) 2 g in dextrose 5 % 50 mL IVPB        2 g 100 mL/hr over 30 Minutes Intravenous Every 12 hours 04/18/11 0926     04/17/11 2100   linezolid (ZYVOX) IVPB 600 mg        600 mg 300 mL/hr over 60 Minutes Intravenous To [facility] Emergency Dept 04/17/11 2030 04/17/11 2216   04/17/11 2100   ceFEPIme (MAXIPIME) 2 g in dextrose 5 % 50 mL IVPB     Comments: Pt has had cephalosporins in the past      2 g 100 mL/hr over 30 Minutes Intravenous To [facility] Emergency Dept 04/17/11 2030 04/18/11 0105          Assessment/Plan: Patient Active Hospital Problem List:  Fever -immunocompromised-renal transplant patient -does have mild neutropenia -no clear cut source of infection-UA and CXR negative. No foci on physical exam as well -await blood culture results -check a 2 D Echo -continue with Cefepime-apparently is allergic to Vancomycin-so will just keep her on Cefepime for now and monitor her clinical course-she does not appear toxic at all.Has a h/o of ?Lupus-will get ANA/dsDNA -doubt TB -if fever's continue or if cultures are significant then will ask ID to evaluate as well.  Acute on CKD-stage 3-prior ESRD-now S/P Renal transplant -creatinine back to baseline after hydration -prior h/o of SLE Nephropathy?? -continue with Prograf and Myfortic  H/O Lupus-with antiphospholipid antibody-prior h/o of DVT -coumadin per pharmacy -not on  steroids currently  Mild Hyponatremia -likely pre-renal -currently clinically euvolemic -monitor off IVF  HTN -moderate control -Continue with Labetolol -resume lasix over the course of the next few days  H/O Atrial Fibrillation -currently NSR -Coumadin per pharmacy   Disposition: Remain inpatient  DVT Prophylaxis: -not needed as on coumadin  Code Status: Full code  Maretta Bees, MD. 04/18/2011, 2:48 PM

## 2011-04-18 NOTE — Progress Notes (Signed)
0500 Pt was placed on airborne precautions to rule out TB.  Information to put patient on TB precautions was found in MD progress note after TB test arrived on unit at approximately 0445. Debbie [name]

## 2011-04-19 DIAGNOSIS — R509 Fever, unspecified: Secondary | ICD-10-CM

## 2011-04-19 LAB — CBC
HCT: 30.2 % — ABNORMAL LOW (ref 36.0–46.0)
MCHC: 33.4 g/dL (ref 30.0–36.0)
Platelets: 152 10*3/uL (ref 150–400)
Platelets: 156 10*3/uL (ref 150–400)
RDW: 14 % (ref 11.5–15.5)
RDW: 13.7 % (ref 11.5–15.5)
WBC: 3 10*3/uL — ABNORMAL LOW (ref 4.0–10.5)
WBC: 3.2 10*3/uL — ABNORMAL LOW (ref 4.0–10.5)

## 2011-04-19 LAB — PROTIME-INR
INR: 1.9 — ABNORMAL HIGH (ref 0.00–1.49)
Prothrombin Time: 22.1 seconds — ABNORMAL HIGH (ref 11.6–15.2)

## 2011-04-19 LAB — C4 COMPLEMENT: Complement C4, Body Fluid: 20 mg/dL (ref 10–40)

## 2011-04-19 LAB — BASIC METABOLIC PANEL
Chloride: 103 mEq/L (ref 96–112)
GFR calc Af Amer: 41 mL/min — ABNORMAL LOW (ref >90)
Potassium: 4.5 mEq/L (ref 3.5–5.1)
Sodium: 132 mEq/L — ABNORMAL LOW (ref 135–145)

## 2011-04-19 LAB — DIFFERENTIAL
Basophils Absolute: 0 10*3/uL (ref 0.0–0.1)
Basophils Relative: 1 % (ref 0–1)
Lymphocytes Relative: 40 % (ref 12–46)
Neutro Abs: 1.3 10*3/uL — ABNORMAL LOW (ref 1.7–7.7)

## 2011-04-19 LAB — GLUCOSE, CAPILLARY: Glucose-Capillary: 79 mg/dL (ref 70–99)

## 2011-04-19 MED ORDER — WARFARIN SODIUM 5 MG PO TABS
5.0000 mg | ORAL_TABLET | Freq: Once | ORAL | Status: AC
  Administered 2011-04-19: 5 mg via ORAL
  Filled 2011-04-19: qty 1

## 2011-04-19 MED ORDER — WARFARIN - PHARMACIST DOSING INPATIENT
Freq: Every day | Status: DC
  Administered 2011-04-19: 18:00:00

## 2011-04-19 NOTE — Consult Note (Signed)
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 She has been on routine chronic immunosuppression with tacrolimus and mycophenolate. 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 She has had no infectious complications since the transplantation. She came to the ED on 03/26 complaining of persistent fevers to 102 that started 2-3 days before and were accompanied by chills, night sweats and diaphoresis. She had never experienced similar symptoms in the past and denied abdominal pain, diarrhea, dysuria, chest pain, coughing, sputum production or chest pain. Her WBC measured 3.8 on admission with an absolute neutrophil count of 2.4. There was concern for febrile neutropenia for which she was empirically treated with cefepime. She also received 1 dose of Zyvox which was subsequently discontinued. She was admitted to the telemetry floor and placed on airborne precautions due to concern for TB. She was managed with supportive fluids, analgesics and Tylenol and the fever gradually resolved. Blood and urine cultures were sent and are negative to date, a CXR was within normal limits and a PPD was negative for latent TB. She remains afebrile for two consecutive days and has been hemodynamically stable. Her creatinine is at her baseline, though nothing to compare to prior to this hospitalization since 2010.  No urinary burning, dysuria, frequency.    Review of Systems  Constitutional: positive for fever, chills and diaphoresis.  HENT: Negative for hearing loss and tinnitus.  Eyes: Negative for blurred vision, double vision and photophobia.  Respiratory: Negative for cough, hemoptysis and  sputum production.  Cardiovascular: negative for chest pain, palpitations, orthopnea and claudication.  Gastrointestinal: Negative for heartburn, nausea, vomiting and abdominal pain.  Musculoskeletal: negative for joint or back pain.  Skin: Negative for itching and rash.  Neurological: Negative for dizziness, tingling, tremors, weakness and headaches.  Endo/Heme/Allergies: negative for polydipisa Negative for environmental allergies.  Psychiatric/Behavioral: Negative for depression, suicidal ideas and substance abuse.   Past Medical History  Diagnosis Date  . Kidney transplant as cause of abnormal reaction or later complication   . Renal disorder    Past Surgical History  Procedure Date  . Nephrectomy transplanted organ    History reviewed. No pertinent family history.   Allergies  Allergen Reactions  . Gentamycin (Gentamicin Sulfate) Itching and Swelling  . Penicillins Itching  . Vancomycin Itching and Swelling     History   Social History  . Marital Status: Married    Spouse Name: N/A    Number of Children: N/A  . Years of Education: N/A   Occupational History  . Not on file.   Social History Main Topics  . Smoking status: Former Smoker -- 1.0 packs/day for 30 years    Quit date: 04/16/1977  . Smokeless tobacco: Not on file  . Alcohol Use: No  . Drug Use: No  . Sexually Active:    Other Topics Concern  . Not on file   Social History Narrative  . No narrative on file   No current facility-administered medications on file prior to encounter.   Current Outpatient Prescriptions on File Prior to Encounter  Medication Sig Dispense Refill  . aspirin EC 81 MG tablet Take 81 mg by mouth daily.      . furosemide (LASIX) 20  MG tablet Take 20 mg by mouth daily as needed. Excess edema      . labetalol (NORMODYNE) 200 MG tablet Take 100 mg by mouth 2 (two) times daily.      . magnesium oxide (MAG-OX) 400 MG tablet Take 400 mg by mouth 2 (two) times daily.      .  mycophenolate (MYFORTIC) 180 MG EC tablet Take 360 mg by mouth 2 (two) times daily.      . ranitidine (ZANTAC) 150 MG tablet Take 150 mg by mouth at bedtime.      . sodium bicarbonate 650 MG tablet Take 650 mg by mouth 2 (two) times daily.      . tacrolimus (PROGRAF) 1 MG capsule Take 3-4 mg by mouth 2 (two) times daily. 4 caps in am and 3 caps in pm.      . timolol (BETIMOL) 0.5 % ophthalmic solution Place 1 drop into both eyes 2 (two) times daily.      Marland Kitchen warfarin (COUMADIN) 2.5 MG tablet Take 2.5 mg by mouth daily.        Objective: Vital signs in last 24 hours: Temp:  [97.5 F (36.4 C)-97.9 F (36.6 C)] 97.5 F (36.4 C) (03/28 1354) Pulse Rate:  [67-78] 67  (03/28 1354) Resp:  [18-20] 18  (03/28 1354) BP: (128-172)/(67-78) 164/67 mmHg (03/28 1354) SpO2:  [98 %-100 %] 100 % (03/28 1354)  Intake/Output from previous day: 03/27 0701 - 03/28 0700 In: 601 [P.O.:598; I.V.:3] Out: -  Intake/Output this shift: Total I/O In: 483 [P.O.:480; I.V.:3] Out: -   Physical exam:   Const: alert and cooperative, in no acute distress HEENT: EOMI, PEERL atraumatic, normocephalic, missing upper row of teeth  CV: RRR, normal s1 s2, no mrg LUNGS: CTA bilaterally, no rhonchi or wheezing ABD: soft, non tender, non distended, normal BS, no hepatosplenomegaly SKIN: no rashes or edema, old scars from multiple implantation of AV dialysis grafts NEURO: CN grossly intact, intact motor and sensory functions on UE and LE  Lab Results  Basename 04/19/11 0524 04/18/11 0500  WBC 3.0* 3.2*  HGB 10.3* 9.6*  HCT 30.2* 28.6*  NA 132* 130*  K 4.5 4.1  CL 103 101  CO2 19 19  BUN 20 23  CREATININE 1.55* 1.62*  GLU -- --   Liver Panel  Basename 04/17/11 1719  PROT 6.6  ALBUMIN 3.6  AST 35  ALT 16  ALKPHOS 99  BILITOT 0.4  BILIDIR --  IBILI --   Sedimentation Rate  Basename 04/18/11 1539  ESRSEDRATE 25*   C-Reactive Protein No results found for this basename: CRP:2 in the last 72  hours  Microbiology: Recent Results (from the past 240 hour(s))  URINE CULTURE     Status: Normal   Collection Time   04/17/11  6:49 PM      Component Value Range Status Comment   Specimen Description URINE, CLEAN CATCH   Final    Special Requests NONE   Final    Culture  Setup Time [id number]   Final    Colony Count 6,000 COLONIES/ML   Final    Culture INSIGNIFICANT GROWTH   Final    Report Status 04/18/2011 FINAL   Final   CULTURE, BLOOD (ROUTINE X 2)     Status: Normal (Preliminary result)   Collection Time   04/17/11  7:15 PM      Component Value Range Status Comment   Specimen Description BLOOD HAND LEFT   Final    Special Requests  BOTTLES DRAWN AEROBIC ONLY 10CC   Final    Culture  Setup Time [id number]   Final    Culture     Final    Value:        BLOOD CULTURE RECEIVED NO GROWTH TO DATE CULTURE WILL BE HELD FOR 5 DAYS BEFORE ISSUING A FINAL NEGATIVE REPORT   Report Status PENDING   Incomplete   CULTURE, BLOOD (ROUTINE X 2)     Status: Normal (Preliminary result)   Collection Time   04/17/11  7:20 PM      Component Value Range Status Comment   Specimen Description BLOOD HAND LEFT   Final    Special Requests BOTTLES DRAWN AEROBIC ONLY 10CC   Final    Culture  Setup Time [id number]   Final    Culture     Final    Value:        BLOOD CULTURE RECEIVED NO GROWTH TO DATE CULTURE WILL BE HELD FOR 5 DAYS BEFORE ISSUING A FINAL NEGATIVE REPORT   Report Status PENDING   Incomplete     Studies/Results: Dg Chest 2 View  04/17/2011  *RADIOLOGY REPORT*  Clinical Data: Fever.  Immunosuppression.  Renal transplant patient.  CHEST - 2 VIEW  Comparison: 12/23/2008  Findings: Mild scarring in the left lung base is stable.  Both lungs are clear.  No evidence of pleural effusion.  Heart size is stable within normal limits.  No mass or lymphadenopathy identified.  IMPRESSION: Stable left basilar scarring.  No active disease.  Original Report Authenticated By: Danae Orleans, M.D.     Medications:  Current facility-administered medications:acetaminophen (TYLENOL) suppository 650 mg, 650 mg, Rectal, Q6H PRN, Alison Murray, MD;  acetaminophen (TYLENOL) tablet 650 mg, 650 mg, Oral, Q6H PRN, Alison Murray, MD;  aspirin EC tablet 81 mg, 81 mg, Oral, Daily, Alison Murray, MD, 81 mg at 04/18/11 1800;  famotidine (PEPCID) tablet 20 mg, 20 mg, Oral, Daily, Alison Murray, MD, 20 mg at 04/18/11 0943 HYDROcodone-acetaminophen (NORCO) 5-325 MG per tablet 1-2 tablet, 1-2 tablet, Oral, Q4H PRN, Alison Murray, MD;  labetalol (NORMODYNE) tablet 100 mg, 100 mg, Oral, BID, Alison Murray, MD, 100 mg at 04/19/11 1021;  magnesium oxide (MAG-OX) tablet 400 mg, 400 mg, Oral, BID, Alison Murray, MD, 400 mg at 04/19/11 1223;  mycophenolate (MYFORTIC) EC tablet 360 mg, 360 mg, Oral, BID, Alison Murray, MD, 360 mg at 04/19/11 1035 ondansetron (ZOFRAN) injection 4 mg, 4 mg, Intravenous, Q6H PRN, Alison Murray, MD;  ondansetron [facility]) tablet 4 mg, 4 mg, Oral, Q6H PRN, Alison Murray, MD;  sodium bicarbonate tablet 650 mg, 650 mg, Oral, BID, Alison Murray, MD, 650 mg at 04/19/11 1224;  sodium chloride 0.9 % injection 3 mL, 3 mL, Intravenous, Q12H, Alison Murray, MD, 3 mL at 04/19/11 1124 tacrolimus (PROGRAF) capsule 3 mg, 3 mg, Oral, QHS, Eduard Clos, MD, 3 mg at 04/18/11 2220;  tacrolimus (PROGRAF) capsule 4 mg, 4 mg, Oral, Daily, Alison Murray, MD, 4 mg at 04/19/11 1022;  timolol (TIMOPTIC) 0.5 % ophthalmic solution 1 drop, 1 drop, Both Eyes, BID, Eduard Clos, MD, 1 drop at 04/19/11 1025;  tuberculin injection 5 Units, 5 Units, Intradermal, Once, Alison Murray, MD warfarin (COUMADIN) tablet 5 mg, 5 mg, Oral, ONCE-1800, Crystal Salomon Fick, PHARMD;  Warfarin - Pharmacist Dosing Inpatient, , Does not apply, q1800, Crystal Salomon Fick, PHARMD;  DISCONTD: ceFEPIme (MAXIPIME) 2 g in dextrose  5 % 50 mL IVPB, 2 g, Intravenous, Q12H, Maretta Bees, MD, 2 g at 04/19/11 1026;   DISCONTD: warfarin (COUMADIN) tablet 2.5 mg, 2.5 mg, Oral, q1800, Alison Murray, MD, 2.5 mg at 04/18/11 1756 DISCONTD: Warfarin - Physician Dosing Inpatient, , Does not apply, q1800, Alison Murray, MD  Assessment/Plan:  Assessment: this is a patient who presented for evaluation of fever s/p renal transplantation in 2010 on chronic immunosuppression. She is not neutropenic with an ANC of > 2000 and would not be expected to become neutropenic with mycophenolate or tacrolimus which more likely cause leukopenia. The fever has resolved and she has remained stable for two days. Her hx of renal transplant > 6 months ago and immunosuppression puts her at an increased risk of respiratory viruses or other viral causes (CMV, adenovirus and BKV), though she currently has no symptoms or urinary, GI, pulmonary or visual issues.  Also, she has no risk factors for Tb, is asymptomatic and CXR without opacity.    Plan: I would recommend discontinuing cefepime at this point. Will monitor patient's temperatures and repeat white count differential.  -d/c respiratory isolation  Thanks for the consult   LOS: 2 days    Staci Righter, MD

## 2011-04-19 NOTE — Progress Notes (Signed)
04/18/11 NSG 0800 PPD was placed on outside of left FA at 0625 this morning by Windell Moulding, RN.  Needs to be read on 04/20/11 after 0630.  Forbes Cellar, RN

## 2011-04-19 NOTE — Progress Notes (Signed)
PATIENT DETAILS Name: Debbie [name] Age: 61 y.o. Sex: female Date of Birth: 02/06/1950 Admit Date: 04/17/2011 RUE:[id number], MD, MD  Subjective: No major complaints-afebrile over night  Objective: Vital signs in last 24 hours: Filed Vitals:   04/18/11 1320 04/18/11 2158 04/19/11 0536 04/19/11 1021  BP: 163/74 172/78 128/67 153/68  Pulse: 75 78 69 71  Temp: 97.8 F (36.6 C) 97.7 F (36.5 C) 97.9 F (36.6 C)   TempSrc: Oral Oral Other (Comment)   Resp: 20 20 18    Height:      Weight:      SpO2: 97% 100% 98%     Weight change:   Body mass index is 32.50 kg/(m^2).  Intake/Output from previous day:  Intake/Output Summary (Last 24 hours) at 04/19/11 1145 Last data filed at 04/19/11 1124  Gross per 24 hour  Intake    483 ml  Output      0 ml  Net    483 ml    PHYSICAL EXAM: Gen Exam: Awake and alert with clear speech.   Neck: Supple, No JVD.   Chest: B/L Clear.   CVS: S1 S2 Regular, no murmurs.  Abdomen: soft, BS +, non tender, non distended.  Extremities: no edema, lower extremities warm to touch. Neurologic: Non Focal.   Skin: No Rash.  Wounds: N/A.    CONSULTS:  None  LAB RESULTS: CBC  Lab 04/19/11 0524 04/18/11 0500 04/17/11 1719  WBC 3.0* 3.2* 3.8*  HGB 10.3* 9.6* 11.0*  HCT 30.2* 28.6* 33.5*  PLT 152 153 168  MCV 79.9 80.3 80.0  MCH 27.2 27.0 26.3  MCHC 34.1 33.6 32.8  RDW 14.0 13.9 13.8  LYMPHSABS -- -- 0.8  MONOABS -- -- 0.4  EOSABS -- -- 0.1  BASOSABS -- -- 0.0  BANDABS -- -- --    Chemistries   Lab 04/19/11 0524 04/18/11 0500 04/17/11 2135 04/17/11 1719  NA 132* 130* -- 128*  K 4.5 4.1 -- 5.1  CL 103 101 -- 99  CO2 19 19 -- 20  GLUCOSE 91 98 -- 99  BUN 20 23 -- 21  CREATININE 1.55* 1.62* -- 1.59*  CALCIUM 9.6 9.2 -- 10.0  MG -- -- 1.6 --    GFR Estimated Creatinine Clearance: 36 ml/min (by C-G formula based on Cr of 1.55).  Coagulation profile  Lab 04/19/11 0524 04/18/11 0500 04/17/11 2135  INR 1.90* 1.97* 1.86*    PROTIME -- -- --    Cardiac Enzymes  Lab 04/18/11 1335 04/18/11 0500 04/17/11 2134  CKMB 2.3 1.8 1.8  TROPONINI <0.30 <0.30 <0.30  MYOGLOBIN -- -- --    No components found with this basename: POCBNP:3 No results found for this basename: DDIMER:2 in the last 72 hours  Basename 04/17/11 2135  HGBA1C 5.9*   No results found for this basename: CHOL:2,HDL:2,LDLCALC:2,TRIG:2,CHOLHDL:2,LDLDIRECT:2 in the last 72 hours  Basename 04/17/11 2135  TSH 4.691*  T4TOTAL --  T3FREE --  THYROIDAB --   No results found for this basename: VITAMINB12:2,FOLATE:2,FERRITIN:2,TIBC:2,IRON:2,RETICCTPCT:2 in the last 72 hours No results found for this basename: LIPASE:2,AMYLASE:2 in the last 72 hours  Urine Studies No results found for this basename: UACOL:2,UAPR:2,USPG:2,UPH:2,UTP:2,UGL:2,UKET:2,UBIL:2,UHGB:2,UNIT:2,UROB:2,ULEU:2,UEPI:2,UWBC:2,URBC:2,UBAC:2,CAST:2,CRYS:2,UCOM:2,BILUA:2 in the last 72 hours  MICROBIOLOGY: Recent Results (from the past 240 hour(s))  URINE CULTURE     Status: Normal   Collection Time   04/17/11  6:49 PM      Component Value Range Status Comment   Specimen Description URINE, CLEAN CATCH   Final    Special  Requests NONE   Final    Culture  Setup Time [id number]   Final    Colony Count 6,000 COLONIES/ML   Final    Culture INSIGNIFICANT GROWTH   Final    Report Status 04/18/2011 FINAL   Final   CULTURE, BLOOD (ROUTINE X 2)     Status: Normal (Preliminary result)   Collection Time   04/17/11  7:15 PM      Component Value Range Status Comment   Specimen Description BLOOD HAND LEFT   Final    Special Requests BOTTLES DRAWN AEROBIC ONLY 10CC   Final    Culture  Setup Time [id number]   Final    Culture     Final    Value:        BLOOD CULTURE RECEIVED NO GROWTH TO DATE CULTURE WILL BE HELD FOR 5 DAYS BEFORE ISSUING A FINAL NEGATIVE REPORT   Report Status PENDING   Incomplete   CULTURE, BLOOD (ROUTINE X 2)     Status: Normal (Preliminary result)   Collection Time    04/17/11  7:20 PM      Component Value Range Status Comment   Specimen Description BLOOD HAND LEFT   Final    Special Requests BOTTLES DRAWN AEROBIC ONLY 10CC   Final    Culture  Setup Time [id number]   Final    Culture     Final    Value:        BLOOD CULTURE RECEIVED NO GROWTH TO DATE CULTURE WILL BE HELD FOR 5 DAYS BEFORE ISSUING A FINAL NEGATIVE REPORT   Report Status PENDING   Incomplete     RADIOLOGY STUDIES/RESULTS: Dg Chest 2 View  04/17/2011  *RADIOLOGY REPORT*  Clinical Data: Fever.  Immunosuppression.  Renal transplant patient.  CHEST - 2 VIEW  Comparison: 12/23/2008  Findings: Mild scarring in the left lung base is stable.  Both lungs are clear.  No evidence of pleural effusion.  Heart size is stable within normal limits.  No mass or lymphadenopathy identified.  IMPRESSION: Stable left basilar scarring.  No active disease.  Original Report Authenticated By: Danae Orleans, M.D.    MEDICATIONS: Scheduled Meds:    . aspirin EC  81 mg Oral Daily  . ceFEPime (MAXIPIME) IV  2 g Intravenous Q12H  . diphenhydrAMINE      . famotidine  20 mg Oral Daily  . labetalol  100 mg Oral BID  . magnesium oxide  400 mg Oral BID  . mycophenolate  360 mg Oral BID  . sodium bicarbonate  650 mg Oral BID  . sodium chloride  3 mL Intravenous Q12H  . tacrolimus  3 mg Oral QHS  . tacrolimus  4 mg Oral Daily  . timolol  1 drop Both Eyes BID  . tuberculin  5 Units Intradermal Once  . warfarin  5 mg Oral ONCE-1800  . Warfarin - Pharmacist Dosing Inpatient   Does not apply q1800  . DISCONTD: warfarin  2.5 mg Oral q1800  . DISCONTD: Warfarin - Physician Dosing Inpatient   Does not apply q1800   Continuous Infusions:  PRN Meds:.acetaminophen, acetaminophen, HYDROcodone-acetaminophen, ondansetron (ZOFRAN) IV, ondansetron  Antibiotics: Anti-infectives     Start     Dose/Rate Route Frequency Ordered Stop   04/18/11 1000   ceFEPIme (MAXIPIME) 2 g in dextrose 5 % 50 mL IVPB        2 g 100 mL/hr  over 30 Minutes Intravenous Every 12 hours 04/18/11 0926  04/17/11 2100   linezolid (ZYVOX) IVPB 600 mg        600 mg 300 mL/hr over 60 Minutes Intravenous To [facility] 04/17/11 2030 04/17/11 2216   04/17/11 2100   ceFEPIme (MAXIPIME) 2 g in dextrose 5 % 50 mL IVPB     Comments: Pt has had cephalosporins in the past      2 g 100 mL/hr over 30 Minutes Intravenous To [facility] 04/17/11 2030 04/18/11 0105          Assessment/Plan: Patient Active Hospital Problem List:  Fever -immunocompromised-renal transplant patient -continues to have mild neutropenia -no clear cut source of infection-UA and CXR negative. No foci on physical exam as well - blood culture negative to date -await 2 D Echo -continue with Cefepime-apparently is allergic to Vancomycin-so will just keep her on Cefepime for now and monitor her clinical course-she -does not appear toxic at all.Has a h/o of ?Lupus- ANA/dsDNA pending -doubt TB -will ask ID to evaluate as well.  Acute on CKD-stage 3-prior ESRD-now S/P Renal transplant -creatinine back to baseline after hydration -prior h/o of SLE Nephropathy?? -continue with Prograf and Myfortic  H/O Lupus-with antiphospholipid antibody-prior h/o of DVT -coumadin per pharmacy -not on steroids currently  Mild Hyponatremia -Na coming up -likely pre-renal -currently clinically euvolemic -monitor off IVF  HTN -moderate control -Continue with Labetolol -resume lasix over the course of the next few days  H/O Atrial Fibrillation -currently NSR -Coumadin per pharmacy   Disposition: Remain inpatient  DVT Prophylaxis: -not needed as on coumadin  Code Status: Full code  Maretta Bees, MD. 04/19/2011, 11:45 AM

## 2011-04-19 NOTE — Progress Notes (Signed)
  Echocardiogram 2D Echocardiogram has been performed.  Sherly Brodbeck, Real Cons 04/19/2011, 9:54 AM

## 2011-04-19 NOTE — Plan of Care (Signed)
Problem: Phase II Progression Outcomes Goal: Discharge plan established Outcome: Completed/Met Date Met:  04/19/11 To return home

## 2011-04-19 NOTE — Progress Notes (Signed)
ANTICOAGULATION CONSULT NOTE - Initial Consult  Pharmacy Consult for Coumadin Indication: H/O Lupus-with antiphospholipid antibody: h/o of DVT  Allergies  Allergen Reactions  . Gentamycin (Gentamicin Sulfate) Itching and Swelling  . Penicillins Itching  . Vancomycin Itching and Swelling    Patient Measurements: Height: 5\' 1"  (154.9 cm) Weight: 172 lb (78.019 kg) IBW/kg (Calculated) : 47.8  Heparin Dosing Weight:    Vital Signs: Temp: 97.9 F (36.6 C) (03/28 0536) Temp src: Other (Comment) (03/28 0536) BP: 128/67 mmHg (03/28 0536) Pulse Rate: 69  (03/28 0536)  Labs:  Basename 04/19/11 0524 04/18/11 1335 04/18/11 0500 04/17/11 2135 04/17/11 2134 04/17/11 1719  HGB 10.3* -- 9.6* -- -- --  HCT 30.2* -- 28.6* -- -- 33.5*  PLT 152 -- 153 -- -- 168  APTT -- -- -- -- -- --  LABPROT 22.1* -- 22.8* 21.8* -- --  INR 1.90* -- 1.97* 1.86* -- --  HEPARINUNFRC -- -- -- -- -- --  CREATININE 1.55* -- 1.62* -- -- 1.59*  CKTOTAL -- 81 88 -- 107 --  CKMB -- 2.3 1.8 -- 1.8 --  TROPONINI -- <0.30 <0.30 -- <0.30 --   Estimated Creatinine Clearance: 36 ml/min (by C-G formula based on Cr of 1.55).  Medical History: Past Medical History  Diagnosis Date  . Kidney transplant as cause of abnormal reaction or later complication   . Renal disorder     Medications:  Prescriptions prior to admission  Medication Sig Dispense Refill  . aspirin EC 81 MG tablet Take 81 mg by mouth daily.      . furosemide (LASIX) 20 MG tablet Take 20 mg by mouth daily as needed. Excess edema      . labetalol (NORMODYNE) 200 MG tablet Take 100 mg by mouth 2 (two) times daily.      . magnesium oxide (MAG-OX) 400 MG tablet Take 400 mg by mouth 2 (two) times daily.      . mycophenolate (MYFORTIC) 180 MG EC tablet Take 360 mg by mouth 2 (two) times daily.      . ranitidine (ZANTAC) 150 MG tablet Take 150 mg by mouth at bedtime.      . sodium bicarbonate 650 MG tablet Take 650 mg by mouth 2 (two) times daily.      .  tacrolimus (PROGRAF) 1 MG capsule Take 3-4 mg by mouth 2 (two) times daily. 4 caps in am and 3 caps in pm.      . timolol (BETIMOL) 0.5 % ophthalmic solution Place 1 drop into both eyes 2 (two) times daily.      Marland Kitchen warfarin (COUMADIN) 2.5 MG tablet Take 2.5 mg by mouth daily.        Assessment: Persistent fevers, chills, diaphoresis, night sweats.   Coumadin now per RX for H/O Lupus-with antiphospholipid antibody-prior h/o of DVT. INR 1.86 on admission 2.5 mg/day. INR has remained stable at 1.9 today. Asked for pharmacy to assist with dosing during 5500 rounds this am.   ID: Now afebrile. WBC 3. Patient allergic to PCN and Vanco. MD wants to continue Cefepime. Will dose for febrile neutropenia at 2g/12h. Airborne precautions until r/o TB.  GI/Nutriton: Famotidine once daily. Magox, NaBicarb tabs.  Renal:Kidney transplant. Scr down to 1.55. Meds: Mycophenolate, Tacrolimus  Goal of Therapy:  INR 2-3   Plan:  Coumadin 5mg  po x 1 today  then may recommend for home: 2.5mg  TTSS with 3.75mg  (1.5 tabs) on MWF.  Breylin Dom S. Merilynn Finland, PharmD, BCPS Clinical Staff Pharmacist Pager [phone]  04/19/2011,9:54 AM

## 2011-04-20 LAB — URINE CULTURE
Colony Count: NO GROWTH
Culture: NO GROWTH

## 2011-04-20 LAB — PROTIME-INR
INR: 1.85 — ABNORMAL HIGH (ref 0.00–1.49)
Prothrombin Time: 21.7 seconds — ABNORMAL HIGH (ref 11.6–15.2)

## 2011-04-20 LAB — GLUCOSE, CAPILLARY: Glucose-Capillary: 83 mg/dL (ref 70–99)

## 2011-04-20 MED ORDER — WARFARIN SODIUM 5 MG PO TABS
5.0000 mg | ORAL_TABLET | Freq: Once | ORAL | Status: DC
Start: 2011-04-20 — End: 2011-04-20
  Filled 2011-04-20: qty 1

## 2011-04-20 MED ORDER — ACETAMINOPHEN 325 MG PO TABS: 650.0000 mg | ORAL_TABLET | Freq: Four times a day (QID) | ORAL | Status: AC | PRN

## 2011-04-20 NOTE — Progress Notes (Signed)
INFECTIOUS DISEASE PROGRESS NOTE    Date of Admission:  04/17/2011   Total days of antibiotics 3 (3/26-28)          Principal Problem:  *Neutropenic fever Active Problems:  Kidney transplant as cause of abnormal reaction or later complication  Hyponatremia  CKD (chronic kidney disease), stage III      . aspirin EC  81 mg Oral Daily  . famotidine  20 mg Oral Daily  . labetalol  100 mg Oral BID  . magnesium oxide  400 mg Oral BID  . mycophenolate  360 mg Oral BID  . sodium bicarbonate  650 mg Oral BID  . sodium chloride  3 mL Intravenous Q12H  . tacrolimus  3 mg Oral QHS  . tacrolimus  4 mg Oral Daily  . timolol  1 drop Both Eyes BID  . tuberculin  5 Units Intradermal Once  . warfarin  5 mg Oral ONCE-1800  . Warfarin - Pharmacist Dosing Inpatient   Does not apply q1800  . DISCONTD: ceFEPime (MAXIPIME) IV  2 g Intravenous Q12H    Subjective:  Some sweats last night which is typically for her, though more than usual.  Some abdominal discomfort, also typical for her.   Objective: Temp:  [97.5 F (36.4 C)-98.2 F (36.8 C)] 97.9 F (36.6 C) (03/29 0519) Pulse Rate:  [67-71] 70  (03/29 0519) Resp:  [17-18] 17  (03/29 0519) BP: (153-167)/(67-74) 167/74 mmHg (03/29 0519) SpO2:  [98 %-100 %] 100 % (03/29 0519)  General: AAO x 3, NAD Skin: no rashes Lungs: CTA B Cor: RRR without m/r/g Abdomen: soft, ntnd, +bs   Lab Results Lab Results  Component Value Date   WBC 3.2* 04/19/2011   HGB 9.8* 04/19/2011   HCT 29.3* 04/19/2011   MCV 79.4 04/19/2011   PLT 156 04/19/2011    Lab Results  Component Value Date   CREATININE 1.55* 04/19/2011   BUN 20 04/19/2011   NA 132* 04/19/2011   K 4.5 04/19/2011   CL 103 04/19/2011   CO2 19 04/19/2011    Lab Results  Component Value Date   ALT 16 04/17/2011   AST 35 04/17/2011   ALKPHOS 99 04/17/2011   BILITOT 0.4 04/17/2011       Microbiology: Recent Results (from the past 240 hour(s))  URINE CULTURE     Status: Normal   Collection  Time   04/17/11  6:49 PM      Component Value Range Status Comment   Specimen Description URINE, CLEAN CATCH   Final    Special Requests NONE   Final    Culture  Setup Time [id number]   Final    Colony Count 6,000 COLONIES/ML   Final    Culture INSIGNIFICANT GROWTH   Final    Report Status 04/18/2011 FINAL   Final   CULTURE, BLOOD (ROUTINE X 2)     Status: Normal (Preliminary result)   Collection Time   04/17/11  7:15 PM      Component Value Range Status Comment   Specimen Description BLOOD HAND LEFT   Final    Special Requests BOTTLES DRAWN AEROBIC ONLY 10CC   Final    Culture  Setup Time [id number]   Final    Culture     Final    Value:        BLOOD CULTURE RECEIVED NO GROWTH TO DATE CULTURE WILL BE HELD FOR 5 DAYS BEFORE ISSUING A FINAL NEGATIVE REPORT   Report Status PENDING  Incomplete   CULTURE, BLOOD (ROUTINE X 2)     Status: Normal (Preliminary result)   Collection Time   04/17/11  7:20 PM      Component Value Range Status Comment   Specimen Description BLOOD HAND LEFT   Final    Special Requests BOTTLES DRAWN AEROBIC ONLY 10CC   Final    Culture  Setup Time [id number]   Final    Culture     Final    Value:        BLOOD CULTURE RECEIVED NO GROWTH TO DATE CULTURE WILL BE HELD FOR 5 DAYS BEFORE ISSUING A FINAL NEGATIVE REPORT   Report Status PENDING   Incomplete   URINE CULTURE     Status: Normal   Collection Time   04/18/11 10:03 PM      Component Value Range Status Comment   Specimen Description URINE, RANDOM   Final    Special Requests NONE   Final    Culture  Setup Time [id number]   Final    Colony Count NO GROWTH   Final    Culture NO GROWTH   Final    Report Status 04/20/2011 FINAL   Final     Studies/Results: No results found.   Assessment: 61 yo on immunosuppresion for history of renal transplant in 2010 presented with fever. Afebrile since 3/27. ANC 1.3, a little below normal.   Plan: 1. Continue off antibiotics. Supportive care.  I am available  if needed.  Thanks   Staci Righter, MD

## 2011-04-20 NOTE — Progress Notes (Signed)
PATIENT DETAILS Name: Debbie [name] Age: 61 y.o. Sex: female Date of Birth: [birth year] Admit Date: 04/17/2011 ZOX:[id number],[id number], MD, MD  Subjective: No major complaints-afebrile off antibiotics Objective: Vital signs in last 24 hours: Filed Vitals:   04/19/11 1021 04/19/11 1354 04/19/11 2100 04/20/11 0519  BP: 153/68 164/67 162/73 167/74  Pulse: 71 67 71 70  Temp:  97.5 F (36.4 C) 98.2 F (36.8 C) 97.9 F (36.6 C)  TempSrc:  Oral Oral Oral  Resp:  18 18 17   Height:      Weight:      SpO2:  100% 98% 100%    Weight change:   Body mass index is 32.50 kg/(m^2).  Intake/Output from previous day:  Intake/Output Summary (Last 24 hours) at 04/20/11 1125 Last data filed at 04/20/11 0900  Gross per 24 hour  Intake    600 ml  Output      0 ml  Net    600 ml    PHYSICAL EXAM: Gen Exam: Awake and alert with clear speech.   Neck: Supple, No JVD.   Chest: B/L Clear.   CVS: S1 S2 Regular, no murmurs.  Abdomen: soft, BS +, non tender, non distended.  Extremities: no edema, lower extremities warm to touch. Neurologic: Non Focal.   Skin: No Rash.  Wounds: N/A.    CONSULTS:  None  LAB RESULTS: CBC  Lab 04/19/11 1824 04/19/11 0524 04/18/11 0500 04/17/11 1719  WBC 3.2* 3.0* 3.2* 3.8*  HGB 9.8* 10.3* 9.6* 11.0*  HCT 29.3* 30.2* 28.6* 33.5*  PLT 156 152 153 168  MCV 79.4 79.9 80.3 80.0  MCH 26.6 27.2 27.0 26.3  MCHC 33.4 34.1 33.6 32.8  RDW 13.7 14.0 13.9 13.8  LYMPHSABS 1.3 -- -- 0.8  MONOABS 0.4 -- -- 0.4  EOSABS 0.2 -- -- 0.1  BASOSABS 0.0 -- -- 0.0  BANDABS -- -- -- --    Chemistries   Lab 04/19/11 0524 04/18/11 0500 04/17/11 2135 04/17/11 1719  NA 132* 130* -- 128*  K 4.5 4.1 -- 5.1  CL 103 101 -- 99  CO2 19 19 -- 20  GLUCOSE 91 98 -- 99  BUN 20 23 -- 21  CREATININE 1.55* 1.62* -- 1.59*  CALCIUM 9.6 9.2 -- 10.0  MG -- -- 1.6 --    GFR Estimated Creatinine Clearance: 36 ml/min (by C-G formula based on Cr of 1.55).  Coagulation profile  Lab  04/20/11 0525 04/19/11 0524 04/18/11 0500 04/17/11 2135  INR 1.85* 1.90* 1.97* 1.86*  PROTIME -- -- -- --    Cardiac Enzymes  Lab 04/18/11 1335 04/18/11 0500 04/17/11 2134  CKMB 2.3 1.8 1.8  TROPONINI <0.30 <0.30 <0.30  MYOGLOBIN -- -- --    No components found with this basename: POCBNP:3 No results found for this basename: DDIMER:2 in the last 72 hours  Basename 04/17/11 2135  HGBA1C 5.9*   No results found for this basename: CHOL:2,HDL:2,LDLCALC:2,TRIG:2,CHOLHDL:2,LDLDIRECT:2 in the last 72 hours  Basename 04/17/11 2135  TSH 4.691*  T4TOTAL --  T3FREE --  THYROIDAB --   No results found for this basename: VITAMINB12:2,FOLATE:2,FERRITIN:2,TIBC:2,IRON:2,RETICCTPCT:2 in the last 72 hours No results found for this basename: LIPASE:2,AMYLASE:2 in the last 72 hours  Urine Studies No results found for this basename: UACOL:2,UAPR:2,USPG:2,UPH:2,UTP:2,UGL:2,UKET:2,UBIL:2,UHGB:2,UNIT:2,UROB:2,ULEU:2,UEPI:2,UWBC:2,URBC:2,UBAC:2,CAST:2,CRYS:2,UCOM:2,BILUA:2 in the last 72 hours  MICROBIOLOGY: Recent Results (from the past 240 hour(s))  URINE CULTURE     Status: Normal   Collection Time   04/17/11  6:49 PM      Component Value Range  Status Comment   Specimen Description URINE, CLEAN CATCH   Final    Special Requests NONE   Final    Culture  Setup Time [id number]   Final    Colony Count 6,000 COLONIES/ML   Final    Culture INSIGNIFICANT GROWTH   Final    Report Status 04/18/2011 FINAL   Final   CULTURE, BLOOD (ROUTINE X 2)     Status: Normal (Preliminary result)   Collection Time   04/17/11  7:15 PM      Component Value Range Status Comment   Specimen Description BLOOD HAND LEFT   Final    Special Requests BOTTLES DRAWN AEROBIC ONLY 10CC   Final    Culture  Setup Time [id number]   Final    Culture     Final    Value:        BLOOD CULTURE RECEIVED NO GROWTH TO DATE CULTURE WILL BE HELD FOR 5 DAYS BEFORE ISSUING A FINAL NEGATIVE REPORT   Report Status PENDING   Incomplete     CULTURE, BLOOD (ROUTINE X 2)     Status: Normal (Preliminary result)   Collection Time   04/17/11  7:20 PM      Component Value Range Status Comment   Specimen Description BLOOD HAND LEFT   Final    Special Requests BOTTLES DRAWN AEROBIC ONLY 10CC   Final    Culture  Setup Time [id number]   Final    Culture     Final    Value:        BLOOD CULTURE RECEIVED NO GROWTH TO DATE CULTURE WILL BE HELD FOR 5 DAYS BEFORE ISSUING A FINAL NEGATIVE REPORT   Report Status PENDING   Incomplete   URINE CULTURE     Status: Normal   Collection Time   04/18/11 10:03 PM      Component Value Range Status Comment   Specimen Description URINE, RANDOM   Final    Special Requests NONE   Final    Culture  Setup Time [id number]   Final    Colony Count NO GROWTH   Final    Culture NO GROWTH   Final    Report Status 04/20/2011 FINAL   Final     RADIOLOGY STUDIES/RESULTS: Dg Chest 2 View  04/17/2011  *RADIOLOGY REPORT*  Clinical Data: Fever.  Immunosuppression.  Renal transplant patient.  CHEST - 2 VIEW  Comparison: 12/23/2008  Findings: Mild scarring in the left lung base is stable.  Both lungs are clear.  No evidence of pleural effusion.  Heart size is stable within normal limits.  No mass or lymphadenopathy identified.  IMPRESSION: Stable left basilar scarring.  No active disease.  Original Report Authenticated By: Danae Orleans, M.D.    MEDICATIONS: Scheduled Meds:    . aspirin EC  81 mg Oral Daily  . famotidine  20 mg Oral Daily  . labetalol  100 mg Oral BID  . magnesium oxide  400 mg Oral BID  . mycophenolate  360 mg Oral BID  . sodium bicarbonate  650 mg Oral BID  . sodium chloride  3 mL Intravenous Q12H  . tacrolimus  3 mg Oral QHS  . tacrolimus  4 mg Oral Daily  . timolol  1 drop Both Eyes BID  . tuberculin  5 Units Intradermal Once  . warfarin  5 mg Oral ONCE-1800  . Warfarin - Pharmacist Dosing Inpatient   Does not apply q1800  . DISCONTD: ceFEPime (MAXIPIME) IV  2 g Intravenous Q12H    Continuous Infusions:  PRN Meds:.acetaminophen, acetaminophen, HYDROcodone-acetaminophen, ondansetron (ZOFRAN) IV, ondansetron  Antibiotics: Anti-infectives     Start     Dose/Rate Route Frequency Ordered Stop   04/18/11 1000   ceFEPIme (MAXIPIME) 2 g in dextrose 5 % 50 mL IVPB  Status:  Discontinued        2 g 100 mL/hr over 30 Minutes Intravenous Every 12 hours 04/18/11 0926 04/19/11 1511   04/17/11 2100   linezolid (ZYVOX) IVPB 600 mg        600 mg 300 mL/hr over 60 Minutes Intravenous To [facility] 04/17/11 2030 04/17/11 2216   04/17/11 2100   ceFEPIme (MAXIPIME) 2 g in dextrose 5 % 50 mL IVPB     Comments: Pt has had cephalosporins in the past      2 g 100 mL/hr over 30 Minutes Intravenous [facility] 04/17/11 2030 04/18/11 0105          Assessment/Plan: Patient Active Hospital Problem List:  Fever -immunocompromised-renal transplant patient --no clear cut source of infection-UA and CXR negative. No foci on physical exam as well - blood culture negative to date - 2 D Echo reviewed -seen by ID-to monitor of antibitics  -ANA negative - Acute on CKD-stage 3-prior ESRD-now S/P Renal transplant -creatinine back to baseline after hydration -prior h/o of SLE Nephropathy?? -continue with Prograf and Myfortic  H/O Lupus-with antiphospholipid antibody-prior h/o of DVT -coumadin per pharmacy -not on steroids currently  Mild Hyponatremia -Na coming up -likely pre-renal -currently clinically euvolemic -monitor off IVF  HTN -moderate control -Continue with Labetolol -resume lasix over the course of the next few days  H/O Atrial Fibrillation -currently NSR -Coumadin per pharmacy   Disposition: Remain inpatient-likely d/c home later this evening or in am  DVT Prophylaxis: -not needed as on coumadin  Code Status: Full code  Maretta Bees, MD. 04/20/2011, 11:25 AM

## 2011-04-20 NOTE — Discharge Summary (Signed)
PATIENT DETAILS Name: Debbie [name] Age: 61 y.o. Sex: female Date of Birth: 1950-02-25 MRN: [id number]. Admit Date: 04/17/2011 Admitting Physician: Alison Murray, MD [id number], MD, MD  PRIMARY DISCHARGE DIAGNOSIS:  Principal Problem:  * fever Active Problems:  Mild Neutropenia  Kidney transplant as cause of abnormal reaction or later complication  Hyponatremia  CKD (chronic kidney disease), stage III Chronic Coumadin therapy    HTN  PAST MEDICAL HISTORY: Past Medical History  Diagnosis Date  . Kidney transplant as cause of abnormal reaction or later complication   . Renal disorder     DISCHARGE MEDICATIONS: Medication List  As of 04/20/2011  1:16 PM   TAKE these medications         acetaminophen 325 MG tablet   Commonly known as: TYLENOL   Take 2 tablets (650 mg total) by mouth every 6 (six) hours as needed (or Fever >/= 101).      aspirin EC 81 MG tablet   Take 81 mg by mouth daily.      furosemide 20 MG tablet   Commonly known as: LASIX   Take 20 mg by mouth daily as needed. Excess edema      labetalol 200 MG tablet   Commonly known as: NORMODYNE   Take 100 mg by mouth 2 (two) times daily.      magnesium oxide 400 MG tablet   Commonly known as: MAG-OX   Take 400 mg by mouth 2 (two) times daily.      mycophenolate 180 MG EC tablet   Commonly known as: MYFORTIC   Take 360 mg by mouth 2 (two) times daily.      ranitidine 150 MG tablet   Commonly known as: ZANTAC   Take 150 mg by mouth at bedtime.      sodium bicarbonate 650 MG tablet   Take 650 mg by mouth 2 (two) times daily.      tacrolimus 1 MG capsule   Commonly known as: PROGRAF   Take 3-4 mg by mouth 2 (two) times daily. 4 caps in am and 3 caps in pm.      timolol 0.5 % ophthalmic solution   Commonly known as: BETIMOL   Place 1 drop into both eyes 2 (two) times daily.      warfarin 2.5 MG tablet   Commonly known as: COUMADIN   Take 2.5 mg by mouth daily.             BRIEF  HPI:  See H&P, Labs, Consult and Test reports for all details in brief, patient was admitted for fever. For further details please see the H&P done on admission  CONSULTATIONS:   ID  PERTINENT RADIOLOGIC STUDIES: Dg Chest 2 View  04/17/2011  *RADIOLOGY REPORT*  Clinical Data: Fever.  Immunosuppression.  Renal transplant patient.  CHEST - 2 VIEW  Comparison: 12/23/2008  Findings: Mild scarring in the left lung base is stable.  Both lungs are clear.  No evidence of pleural effusion.  Heart size is stable within normal limits.  No mass or lymphadenopathy identified.  IMPRESSION: Stable left basilar scarring.  No active disease.  Original Report Authenticated By: Danae Orleans, M.D.     PERTINENT LAB RESULTS: CBC:  Basename 04/19/11 1824 04/19/11 0524  WBC 3.2* 3.0*  HGB 9.8* 10.3*  HCT 29.3* 30.2*  PLT 156 152   CMET CMP     Component Value Date/Time   NA 132* 04/19/2011 0524   K 4.5 04/19/2011 0524  CL 103 04/19/2011 0524   CO2 19 04/19/2011 0524   GLUCOSE 91 04/19/2011 0524   BUN 20 04/19/2011 0524   CREATININE 1.55* 04/19/2011 0524   CALCIUM 9.6 04/19/2011 0524   PROT 6.6 04/17/2011 1719   ALBUMIN 3.6 04/17/2011 1719   AST 35 04/17/2011 1719   ALT 16 04/17/2011 1719   ALKPHOS 99 04/17/2011 1719   BILITOT 0.4 04/17/2011 1719   GFRNONAA 35* 04/19/2011 0524   GFRAA 41* 04/19/2011 0524    GFR Estimated Creatinine Clearance: 36 ml/min (by C-G formula based on Cr of 1.55). No results found for this basename: LIPASE:2,AMYLASE:2 in the last 72 hours  Basename 04/18/11 1335 04/18/11 0500 04/17/11 2134  CKTOTAL 81 88 107  CKMB 2.3 1.8 1.8  CKMBINDEX -- -- --  TROPONINI <0.30 <0.30 <0.30   No components found with this basename: POCBNP:3 No results found for this basename: DDIMER:2 in the last 72 hours  Basename 04/17/11 2135  HGBA1C 5.9*   No results found for this basename: CHOL:2,HDL:2,LDLCALC:2,TRIG:2,CHOLHDL:2,LDLDIRECT:2 in the last 72 hours  Basename 04/17/11 2135  TSH  4.691*  T4TOTAL --  T3FREE --  THYROIDAB --   No results found for this basename: VITAMINB12:2,FOLATE:2,FERRITIN:2,TIBC:2,IRON:2,RETICCTPCT:2 in the last 72 hours Coags:  Basename 04/20/11 0525 04/19/11 0524  INR 1.85* 1.90*   Microbiology: Recent Results (from the past 240 hour(s))  URINE CULTURE     Status: Normal   Collection Time   04/17/11  6:49 PM      Component Value Range Status Comment   Specimen Description URINE, CLEAN CATCH   Final    Special Requests NONE   Final    Culture  Setup Time [id number]   Final    Colony Count 6,000 COLONIES/ML   Final    Culture INSIGNIFICANT GROWTH   Final    Report Status 04/18/2011 FINAL   Final   CULTURE, BLOOD (ROUTINE X 2)     Status: Normal (Preliminary result)   Collection Time   04/17/11  7:15 PM      Component Value Range Status Comment   Specimen Description BLOOD HAND LEFT   Final    Special Requests BOTTLES DRAWN AEROBIC ONLY 10CC   Final    Culture  Setup Time [id number]   Final    Culture     Final    Value:        BLOOD CULTURE RECEIVED NO GROWTH TO DATE CULTURE WILL BE HELD FOR 5 DAYS BEFORE ISSUING A FINAL NEGATIVE REPORT   Report Status PENDING   Incomplete   CULTURE, BLOOD (ROUTINE X 2)     Status: Normal (Preliminary result)   Collection Time   04/17/11  7:20 PM      Component Value Range Status Comment   Specimen Description BLOOD HAND LEFT   Final    Special Requests BOTTLES DRAWN AEROBIC ONLY 10CC   Final    Culture  Setup Time [id number]   Final    Culture     Final    Value:        BLOOD CULTURE RECEIVED NO GROWTH TO DATE CULTURE WILL BE HELD FOR 5 DAYS BEFORE ISSUING A FINAL NEGATIVE REPORT   Report Status PENDING   Incomplete   URINE CULTURE     Status: Normal   Collection Time   04/18/11 10:03 PM      Component Value Range Status Comment   Specimen Description URINE, RANDOM   Final    Special Requests NONE  Final    Culture  Setup Time [id number]   Final    Colony Count NO GROWTH   Final     Culture NO GROWTH   Final    Report Status 04/20/2011 FINAL   Final      BRIEF HOSPITAL COURSE:   Principal Problem:  Fever  -immunocompromised-renal transplant patient on immunosuppressive agents. -patient was admitted, empirically started on Cefepime, blood and urine cultures were drawn. CXR-was negative for PNA. -On exam she did not have any foci of infection, PPD was placed by admitting MD, which was negative. -She was afebrile-apart from the 1st day, her urine and blood cultures were negative, ID was consulted, they stopped her cefepime, and she was watched off antibiotics for approximately 24 hours, I have discussed with Dr Luciana Axe personally-he feels patient does not require any further antibiotics, and has cleared the patient for discharge . I have instructed the patient to call her primary MD or return back to the ED if she were to again have persistent fever's, she claims understanding. -her ANA was negative, ESR was not elevated, and HIV was non reactive  Acute on CKD-stage 3-prior ESRD-now S/P Renal transplant  -creatinine back to baseline after hydration  H/O Lupus-with antiphospholipid antibody-prior h/o of DVT  -resume coumadin   Mild Hyponatremia  -Na coming up  -likely secondary to dehydration -currently clinically euvolemic  H/O Atrial Fibrillation  -currently NSR   TODAY-DAY OF DISCHARGE:  Subjective:   Debbie [name] today has no headache,no chest abdominal pain,no new weakness tingling or numbness, feels much better wants to go home today.   Objective:   Blood pressure 167/74, pulse 70, temperature 97.9 F (36.6 C), temperature source Oral, resp. rate 17, height 5\' 1"  (1.549 m), weight 78.019 kg (172 lb), SpO2 100.00%.  Intake/Output Summary (Last 24 hours) at 04/20/11 1316 Last data filed at 04/20/11 0900  Gross per 24 hour  Intake    360 ml  Output      0 ml  Net    360 ml    Exam Awake Alert, Oriented *3, No new F.N deficits, Normal  affect [place].AT,PERRAL Supple Neck,No JVD, No cervical lymphadenopathy appriciated.  Symmetrical Chest wall movement, Good air movement bilaterally, CTAB RRR,No Gallops,Rubs or new Murmurs, No Parasternal Heave +ve B.Sounds, Abd Soft, Non tender, No organomegaly appriciated, No rebound -guarding or rigidity. No Cyanosis, Clubbing or edema, No new Rash or bruise  DISPOSITION: Home  DISCHARGE INSTRUCTIONS:    Follow-up Information    Follow up with Zada Girt, MD. Schedule an appointment as soon as possible for a visit in 5 days.   Contact information:   [address] [employer] [place] [address] [phone]         Total Time spent on discharge equals 45 minutes.  SignedJeoffrey Massed 04/20/2011 1:16 PM

## 2011-04-20 NOTE — Progress Notes (Signed)
ANTICOAGULATION CONSULT NOTE - Initial Consult  Pharmacy Consult for Coumadin Indication: H/O Lupus-with antiphospholipid antibody: h/o of DVT  Allergies  Allergen Reactions  . Gentamycin (Gentamicin Sulfate) Itching and Swelling  . Penicillins Itching  . Vancomycin Itching and Swelling    Patient Measurements: Height: 5\' 1"  (154.9 cm) Weight: 172 lb (78.019 kg) IBW/kg (Calculated) : 47.8  Heparin Dosing Weight:    Vital Signs: Temp: 97.9 F (36.6 C) (03/29 0519) Temp src: Oral (03/29 0519) BP: 167/74 mmHg (03/29 0519) Pulse Rate: 70  (03/29 0519)  Labs:  Alvira Philips 04/20/11 0525 04/19/11 1824 04/19/11 0524 04/18/11 1335 04/18/11 0500 04/17/11 2134 04/17/11 1719  HGB -- 9.8* 10.3* -- -- -- --  HCT -- 29.3* 30.2* -- 28.6* -- --  PLT -- 156 152 -- 153 -- --  APTT -- -- -- -- -- -- --  LABPROT 21.7* -- 22.1* -- 22.8* -- --  INR 1.85* -- 1.90* -- 1.97* -- --  HEPARINUNFRC -- -- -- -- -- -- --  CREATININE -- -- 1.55* -- 1.62* -- 1.59*  CKTOTAL -- -- -- 81 88 107 --  CKMB -- -- -- 2.3 1.8 1.8 --  TROPONINI -- -- -- <0.30 <0.30 <0.30 --   Estimated Creatinine Clearance: 36 ml/min (by C-G formula based on Cr of 1.55).  Medical History: Past Medical History  Diagnosis Date  . Kidney transplant as cause of abnormal reaction or later complication   . Renal disorder     Medications:  Prescriptions prior to admission  Medication Sig Dispense Refill  . aspirin EC 81 MG tablet Take 81 mg by mouth daily.      . furosemide (LASIX) 20 MG tablet Take 20 mg by mouth daily as needed. Excess edema      . labetalol (NORMODYNE) 200 MG tablet Take 100 mg by mouth 2 (two) times daily.      . magnesium oxide (MAG-OX) 400 MG tablet Take 400 mg by mouth 2 (two) times daily.      . mycophenolate (MYFORTIC) 180 MG EC tablet Take 360 mg by mouth 2 (two) times daily.      . ranitidine (ZANTAC) 150 MG tablet Take 150 mg by mouth at bedtime.      . sodium bicarbonate 650 MG tablet Take 650 mg by  mouth 2 (two) times daily.      . tacrolimus (PROGRAF) 1 MG capsule Take 3-4 mg by mouth 2 (two) times daily. 4 caps in am and 3 caps in pm.      . timolol (BETIMOL) 0.5 % ophthalmic solution Place 1 drop into both eyes 2 (two) times daily.      Marland Kitchen warfarin (COUMADIN) 2.5 MG tablet Take 2.5 mg by mouth daily.        Assessment: Persistent fevers, chills, diaphoresis, night sweats.   Coumadin now per RX for H/O Lupus-with antiphospholipid antibody-prior h/o of DVT. INR 1.86 on admission 2.5 mg/day. INR has remained stable at 1.85 today despite increased dose 3/28.  ID: immunocompromised-renal transplant patient Now afebrile. WBC 3.2.Antibiotics discontinued. UA and CXR negative.  GI/Nutriton: Famotidine once daily. Magox, NaBicarb tabs.  Renal:Kidney transplant. Scr down to 1.55. Meds: Mycophenolate, Tacrolimus  Goal of Therapy:  INR 2-3   Plan:  Coumadin 5mg  po x 1 again today  then may recommend for home: 2.5mg  TTSS with 3.75mg  (1.5 tabs) on MWF.  Aaralyn Kil S. Merilynn Finland, PharmD, BCPS Clinical Staff Pharmacist Pager [phone]  04/20/2011,11:41 AM

## 2011-04-20 NOTE — Progress Notes (Signed)
D/c instructions reviewed with pt. Pt verbalized understanding of all instructions, pt given copy, no scripts. Pt had no further questions.  Pt d/c'd via wheelchair with belongings, escorted by NT.

## 2011-04-20 NOTE — Progress Notes (Signed)
PPD read by RN. No induration noted.  Debbie [name] E

## 2011-04-24 LAB — CULTURE, BLOOD (ROUTINE X 2)
Culture  Setup Time: 201303270120
Culture  Setup Time: 201303270121
Culture: NO GROWTH

## 2011-05-22 ENCOUNTER — Other Ambulatory Visit (HOSPITAL_COMMUNITY): Payer: Self-pay | Admitting: *Deleted

## 2011-05-23 ENCOUNTER — Encounter (HOSPITAL_COMMUNITY)
Admission: RE | Admit: 2011-05-23 | Discharge: 2011-05-23 | Disposition: A | Discharge status: Home / Self Care | Payer: Medicare Other | Source: Ambulatory Visit | Attending: Nephrology | Admitting: Nephrology

## 2011-05-23 DIAGNOSIS — D638 Anemia in other chronic diseases classified elsewhere: Secondary | ICD-10-CM | POA: Insufficient documentation

## 2011-05-23 DIAGNOSIS — Z940 Kidney transplant status: Secondary | ICD-10-CM | POA: Insufficient documentation

## 2011-05-23 DIAGNOSIS — M329 Systemic lupus erythematosus, unspecified: Secondary | ICD-10-CM | POA: Insufficient documentation

## 2011-05-23 LAB — POCT HEMOGLOBIN-HEMACUE: Hemoglobin: 10.1 g/dL — ABNORMAL LOW (ref 12.0–15.0)

## 2011-05-23 MED ORDER — EPOETIN ALFA 10000 UNIT/ML IJ SOLN: 20000.0000 [IU] | INTRAMUSCULAR | Status: DC

## 2011-05-23 MED ORDER — EPOETIN ALFA 20000 UNIT/ML IJ SOLN
INTRAMUSCULAR | Status: AC
  Administered 2011-05-23: 20000 [IU] via SUBCUTANEOUS
  Filled 2011-05-23: qty 1

## 2011-06-19 ENCOUNTER — Telehealth: Payer: Self-pay | Admitting: Internal Medicine

## 2011-06-19 NOTE — Telephone Encounter (Signed)
Pt post kidney transplant and has been having diarrhea for several weeks with no relief. Dr. Caryn Section requesting pt be seen. Scheduled pt to see Willette Cluster NP tomorrow at [facility] to notify pt of appt date and time and fax records.

## 2011-06-20 ENCOUNTER — Encounter: Payer: Self-pay | Admitting: Nurse Practitioner

## 2011-06-20 ENCOUNTER — Ambulatory Visit (INDEPENDENT_AMBULATORY_CARE_PROVIDER_SITE_OTHER): Payer: Medicare Other | Admitting: Nurse Practitioner

## 2011-06-20 ENCOUNTER — Encounter (HOSPITAL_COMMUNITY): Payer: Medicare Other

## 2011-06-20 VITALS — BP 102/60 | HR 64 | Ht 61.0 in | Wt 155.4 lb

## 2011-06-20 DIAGNOSIS — R197 Diarrhea, unspecified: Secondary | ICD-10-CM

## 2011-06-20 MED ORDER — MOVIPREP 100 G PO SOLR: ORAL | Status: DC

## 2011-06-20 NOTE — Patient Instructions (Signed)
Please go to the basement level to have your labs drawn.  We scheduled the colonoscopy with Dr. Marina Goodell on 07-17-2011. Directions provided.   . Colonoscopy A colonoscopy is an exam to evaluate your entire colon. In this exam, your colon is cleansed. A long fiberoptic tube is inserted through your rectum and into your colon. The fiberoptic scope (endoscope) is a long bundle of enclosed and very flexible fibers. These fibers transmit light to the area examined and send images from that area to your caregiver. Discomfort is usually minimal. You may be given a drug to help you sleep (sedative) during or prior to the procedure. This exam helps to detect lumps (tumors), polyps, inflammation, and areas of bleeding. Your caregiver may also take a small piece of tissue (biopsy) that will be examined under a microscope. LET YOUR CAREGIVER KNOW ABOUT:   Allergies to food or medicine.   Medicines taken, including vitamins, herbs, eyedrops, over-the-counter medicines, and creams.   Use of steroids (by mouth or creams).   Previous problems with anesthetics or numbing medicines.   History of bleeding problems or blood clots.   Previous surgery.   Other health problems, including diabetes and kidney problems.   Possibility of pregnancy, if this applies.  BEFORE THE PROCEDURE   A clear liquid diet may be required for 2 days before the exam.   Ask your caregiver about changing or stopping your regular medications.   Liquid injections (enemas) or laxatives may be required.   A large amount of electrolyte solution may be given to you to drink over a short period of time. This solution is used to clean out your colon.   You should be present 60 minutes prior to your procedure or as directed by your caregiver.  AFTER THE PROCEDURE   If you received a sedative or pain relieving medication, you will need to arrange for someone to drive you home.   Occasionally, there is a little blood passed with the  first bowel movement. Do not be concerned.  FINDING OUT THE RESULTS OF YOUR TEST Not all test results are available during your visit. If your test results are not back during the visit, make an appointment with your caregiver to find out the results. Do not assume everything is normal if you have not heard from your caregiver or the medical facility. It is important for you to follow up on all of your test results. HOME CARE INSTRUCTIONS   It is not unusual to pass moderate amounts of gas and experience mild abdominal cramping following the procedure. This is due to air being used to inflate your colon during the exam. Walking or a warm pack on your belly (abdomen) may help.   You may resume all normal meals and activities after sedatives and medicines have worn off.   Only take over-the-counter or prescription medicines for pain, discomfort, or fever as directed by your caregiver. Do not use aspirin or blood thinners if a biopsy was taken. Consult your caregiver for medicine usage if biopsies were taken.  SEEK IMMEDIATE MEDICAL CARE IF:   You have a fever.   You pass large blood clots or fill a toilet with blood following the procedure. This may also occur 10 to 14 days following the procedure. This is more likely if a biopsy was taken.   You develop abdominal pain that keeps getting worse and cannot be relieved with medicine.  Document Released: 01/06/2000 Document Revised: 12/28/2010 Document Reviewed: 08/21/2007 [employer] Patient Information 2012  [employer]

## 2011-06-20 NOTE — Progress Notes (Addendum)
06/21/2011 Debbie [name] [id number] May 25, 1950   HISTORY OF PRESENT ILLNESS: Debbie [name] is a 61 year old female known to Dr. Marina Goodell for a history of a distal esophageal stricture in 2006. She also has a history of adenomatous colon polyps. Her last colonoscopy June 2006 was negative except for large internal hemorrhoids. A repeat colonoscopy was recommended to be done June 2011 but patient was unaware. Debbie [name] has multiple medical problems including, but not limited to hypertension, atrial fibrillation, and a history of renal transplantation 2010. She takes Prograf  Patient referred by nephrology for a one month history of diarrhea. Patient normally has three or so formed bowel movements a day. Over the last month her stools have been loose though no more frequent than usual. Stools do not contain blood . She denies abdominal pain. No nausea. Denies recent medication changes.  In late March patient was hospitalized for fevers. It does not appear that cause of ever was ever determined. She did receive antibiotics while hospitalized.   Past Medical History  Diagnosis Date  . Kidney transplant as cause of abnormal reaction or later complication   . Chronic kidney disease    . Hypertension   . Glaucoma   .  adenomatous colon polyps    Past Surgical History  Procedure Date  . Nephrectomy transplanted organ   . Cholecystectomy     reports that she quit smoking about 34 years ago. She has never used smokeless tobacco. She reports that she does not drink alcohol or use illicit drugs. family history includes Heart disease in her mother and Hypertension in her mother. Allergies  Allergen Reactions  . Gentamycin (Gentamicin Sulfate) Itching and Swelling  . Penicillins Itching  . Vancomycin Itching and Swelling      Outpatient Encounter Prescriptions as of 06/20/2011  Medication Sig Dispense Refill  . acetaminophen (TYLENOL) 325 MG tablet Take 2 tablets (650 mg total) by mouth every 6 (six)  hours as needed (or Fever >/= 101).      Marland Kitchen aspirin EC 81 MG tablet Take 81 mg by mouth daily.      . furosemide (LASIX) 20 MG tablet Take 20 mg by mouth daily as needed. Excess edema      . labetalol (NORMODYNE) 200 MG tablet Take 100 mg by mouth 2 (two) times daily.      . magnesium oxide (MAG-OX) 400 MG tablet Take 400 mg by mouth 2 (two) times daily.      . mycophenolate (MYFORTIC) 180 MG EC tablet Take 360 mg by mouth 2 (two) times daily.      . ranitidine (ZANTAC) 150 MG tablet Take 150 mg by mouth at bedtime.      . sodium bicarbonate 650 MG tablet Take 650 mg by mouth 2 (two) times daily.      . tacrolimus (PROGRAF) 1 MG capsule Take 3-4 mg by mouth 2 (two) times daily. 4 caps in am and 3 caps in pm.      . timolol (BETIMOL) 0.5 % ophthalmic solution Place 1 drop into both eyes 2 (two) times daily.      Marland Kitchen warfarin (COUMADIN) 2.5 MG tablet Take 2.5 mg by mouth daily.      Marland Kitchen MOVIPREP 100 G SOLR Take as directed.  Moviprep  1 kit  0    REVIEW OF SYSTEMS  : All other systems reviewed and negative except where noted in the History of Present Illness.   PHYSICAL EXAM: BP 102/60  Pulse 64  Ht 5'  1" (1.549 m)  Wt 155 lb 6.4 oz (70.489 kg)  BMI 29.36 kg/m2 General: Well developed black female in no acute distress Head: Normocephalic and atraumatic Eyes:  sclerae anicteric,conjunctive pink. Ears: Normal auditory acuity Mouth: No deformity or lesions Neck: Supple, no masses.  Lungs: Clear throughout to auscultation Heart: Regular rate and rhythm; positive for murmur Abdomen: Soft, non distended, nontender. No masses or hepatomegaly noted. Normal Bowel sounds Rectal: not done Musculoskeletal: Symmetrical with no gross deformities  Skin: No lesions on visible extremities Extremities: No edema or deformities noted Neurological: Alert oriented x 4, grossly nonfocal Cervical Nodes:  No significant cervical adenopathy Psychological:  Alert and cooperative. Normal mood and  affect  ASSESSMENT AND PLAN;  1. Subacute diarrhea. C. difficile toxin Gene NAA ordered by Nephrologist was negative. Will order a stool culture today. Her abdominal examination is benign. Though stools are loose, she is not having profuse diarrhea. I am unclear about one of her medications. Patient reports being on Mag-Oxide twice daily. I do not see Mag- Oxide on home medication list faxed over by Nephrology office?? If taking Mag-Oxide this could be causing the loose stools. Recommend holding Mag oxide to see if diarrhea subsides. At any rate, patient is due for a surveillance colonoscopy. We could do random colon biopsies at time of colonoscopy if need be. The risks, benefits, and alternatives to colonoscopy with possible biopsy and possible polypectomy were discussed with the patient and she consents to proceed.   2. Acute on chronic kidney disease. Recent labs from nephrology's office reveal creatinine of 2.64. Patient is status post renal transplant 2010.   3. history of atrial fibrillation  4. Chronic anti-coagulation, possibly for #3 and /or #5.  Dr. Caryn Section manages patient's Coumadin. We will request Coumadin be held for colonoscopy.  5. History of anti-phospholipid antibody syndrome (based on discharge summary march 2013.  Addendum: Reviewed and agree with initial management. Agree with holding mag-ox as this may contribute to loose stools. She is due for colonoscopy anyway given hx of polyps.  Random bx could be performed during this procedure. If all neg, would consider O&P stool Beverley Fiedler, MD

## 2011-06-21 ENCOUNTER — Other Ambulatory Visit: Payer: Medicare Other

## 2011-06-21 ENCOUNTER — Encounter: Payer: Self-pay | Admitting: Nurse Practitioner

## 2011-06-21 DIAGNOSIS — R197 Diarrhea, unspecified: Secondary | ICD-10-CM

## 2011-06-22 ENCOUNTER — Telehealth: Payer: Self-pay | Admitting: *Deleted

## 2011-06-22 LAB — CLOSTRIDIUM DIFFICILE BY PCR: Toxigenic C. Difficile by PCR: NOT DETECTED

## 2011-06-22 NOTE — Telephone Encounter (Signed)
Debbie [name] did call me back and Dr. Caryn Section was not aware that the patient was taking Magnesium Oxide. Dr. Caryn Section thinks she needs to stop taking this medication. I called the patient to advise.

## 2011-06-22 NOTE — Telephone Encounter (Signed)
I called Dr Scherrie Gerlach nurse and left a message.  [phone], ext [phone]) I LM this pt says she is on Mag Oxide and this can be causing her diarrhea.  This med is also not good for kidney patients.  We wanted to know if Dr. Caryn Section was aware she was on this medication.  I LM and asked the nurse to call me.

## 2011-06-22 NOTE — Telephone Encounter (Signed)
I did get to talk to [facility], Dr. Scherrie Gerlach nurse today 06-22-2011.  She will review the chart and talk to Dr. Caryn Section.  She did say they sometimes have people take Mag oxide after a transplant.  This pt had a kidney removed.  [facility] did say she will call me back once she can talk to DR. Fox.

## 2011-06-25 LAB — STOOL CULTURE

## 2011-07-13 ENCOUNTER — Telehealth: Payer: Self-pay | Admitting: Internal Medicine

## 2011-07-13 NOTE — Telephone Encounter (Signed)
WHY DID SHE HAVE IT DONE AT [facility] WHEN SHE JUST SAW [place] A FEW WEEKS AGO???

## 2011-07-13 NOTE — Telephone Encounter (Signed)
Ok, thanks.

## 2011-07-13 NOTE — Telephone Encounter (Signed)
Spoke with patient and she was sent to [facility] by Dr. Caryn Section because her creatinine was increasing and she is a renal transplant. She was having diarrhea. She states she was admitted x 4 days and had a "partial colonoscopy." She is waiting to find out the results.

## 2011-07-17 ENCOUNTER — Encounter: Payer: Medicare Other | Admitting: Internal Medicine

## 2011-07-23 ENCOUNTER — Other Ambulatory Visit (HOSPITAL_COMMUNITY): Payer: Self-pay | Admitting: *Deleted

## 2011-07-27 ENCOUNTER — Encounter (HOSPITAL_COMMUNITY)
Admission: RE | Admit: 2011-07-27 | Discharge: 2011-07-27 | Disposition: A | Discharge status: Home / Self Care | Payer: Medicare Other | Source: Ambulatory Visit | Attending: Nephrology | Admitting: Nephrology

## 2011-07-27 DIAGNOSIS — D638 Anemia in other chronic diseases classified elsewhere: Secondary | ICD-10-CM | POA: Insufficient documentation

## 2011-07-27 DIAGNOSIS — Z940 Kidney transplant status: Secondary | ICD-10-CM | POA: Insufficient documentation

## 2011-07-27 DIAGNOSIS — M329 Systemic lupus erythematosus, unspecified: Secondary | ICD-10-CM | POA: Insufficient documentation

## 2011-07-27 LAB — IRON AND TIBC
Iron: 41 ug/dL — ABNORMAL LOW (ref 42–135)
TIBC: 207 ug/dL — ABNORMAL LOW (ref 250–470)

## 2011-07-27 MED ORDER — EPOETIN ALFA 10000 UNIT/ML IJ SOLN: 20000.0000 [IU] | INTRAMUSCULAR | Status: DC

## 2011-07-27 MED ORDER — EPOETIN ALFA 20000 UNIT/ML IJ SOLN
INTRAMUSCULAR | Status: AC
  Administered 2011-07-27: 20000 [IU] via SUBCUTANEOUS
  Filled 2011-07-27: qty 1

## 2011-08-24 ENCOUNTER — Encounter (HOSPITAL_COMMUNITY): Payer: Medicare Other

## 2011-08-29 ENCOUNTER — Telehealth: Payer: Self-pay | Admitting: Internal Medicine

## 2011-08-29 NOTE — Telephone Encounter (Signed)
Pt scheduled to see Dr. Marina Goodell 09/06/11@1 :30pmCorrie Dandy to notify pt of appt date and time. Dr. Caryn Section requested the OV, states the pt is still having problems with "heavy diarrhea."

## 2011-08-29 NOTE — Telephone Encounter (Signed)
Left message for Debbie [name] to call back 

## 2011-09-03 ENCOUNTER — Encounter (HOSPITAL_COMMUNITY): Payer: Medicare Other

## 2011-09-06 ENCOUNTER — Ambulatory Visit (INDEPENDENT_AMBULATORY_CARE_PROVIDER_SITE_OTHER): Payer: Medicare Other | Admitting: Internal Medicine

## 2011-09-06 ENCOUNTER — Encounter: Payer: Self-pay | Admitting: Internal Medicine

## 2011-09-06 VITALS — BP 96/68 | HR 64 | Ht 61.0 in | Wt 145.8 lb

## 2011-09-06 DIAGNOSIS — R197 Diarrhea, unspecified: Secondary | ICD-10-CM

## 2011-09-06 DIAGNOSIS — R634 Abnormal weight loss: Secondary | ICD-10-CM

## 2011-09-06 DIAGNOSIS — Z86010 Personal history of colonic polyps: Secondary | ICD-10-CM

## 2011-09-06 DIAGNOSIS — D689 Coagulation defect, unspecified: Secondary | ICD-10-CM

## 2011-09-06 MED ORDER — METRONIDAZOLE 250 MG PO TABS: 250.0000 mg | ORAL_TABLET | Freq: Three times a day (TID) | ORAL | Status: AC

## 2011-09-06 NOTE — Patient Instructions (Addendum)
We have sent the following medications to your pharmacy for you to pick up at your convenience: Metronidazole  Please have your PT-INR checked at your coumadin clinic in 5 days  Please follow up with Dr. Marina Goodell in 3 weeks

## 2011-09-12 ENCOUNTER — Other Ambulatory Visit (HOSPITAL_COMMUNITY): Payer: Self-pay | Admitting: *Deleted

## 2011-09-12 ENCOUNTER — Encounter: Payer: Self-pay | Admitting: Internal Medicine

## 2011-09-12 NOTE — Progress Notes (Signed)
HISTORY OF PRESENT ILLNESS:  Debbie [name] is a 61 y.o. 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 Patient is also status post cholecystectomy. She has a history of adenomatous colon polyps with previous colonoscopies in 1999 and 2006. Also, upper endoscopy in 2006 revealing incidental distal esophageal stricture. She had not been seen in the office for some time until 06/20/2011. At that time she saw the nurse practitioner for diarrhea. See that dictation. One concern was that she was taking magnesium oxide. She was to followup with me for additional evaluation of diarrhea and surveillance colonoscopy. However, her nephrologist center at [facility] because of ongoing diarrhea, dehydration, weight loss, and (according to the patient) rising creatinine. While there, she apparently underwent GI workup including colonoscopy, possibly with biopsies. I have absolutely no records from that account. I do not know the results of the workup or treatment is provided. I can tell from the patient whether her problem has improved or not. Apparently she still having some loose stools and was told to come to this office for further evaluation. She tells me that post cholecystectomy she was having approximately 3 bowel movements per day. Currently she tells me that she is averaging one or 2 bowel movements per day. Bowel movements are yellowish and soft. Problem is not exacerbated by meals it does not occur at night. If she takes Imodium she will have no bowel movements for 2 days with subsequent loose stool. She has had ongoing weight loss. Her weight was 155 at the end of May and is currently 145. She's had no bleeding.  REVIEW OF SYSTEMS:  All non-GI ROS negative after comprehensive review  Past Medical History  Diagnosis Date  . Kidney transplant as cause of abnormal reaction or  later complication   . Renal disorder   . Hypertension   . Glaucoma   . Colon polyps   . Gall stones   . Hematoma   . Anti-phospholipid antibody syndrome     (Based on hospital discharge summary march 2013)    Past Surgical History  Procedure Date  . Nephrectomy transplanted organ   . Cholecystectomy     Social History Debbie [name]  reports that she quit smoking about 34 years ago. She has never used smokeless tobacco. She reports that she does not drink alcohol or use illicit drugs.  family history includes Heart disease in her mother and Hypertension in her mother.  Allergies  Allergen Reactions  . Gentamycin (Gentamicin Sulfate) Itching and Swelling  . Penicillins Itching  . Vancomycin Itching and Swelling       PHYSICAL EXAMINATION:  Vital signs: BP 96/68  Pulse 64  Ht 5\' 1"  (1.549 m)  Wt 145 lb 12.8 oz (66.134 kg)  BMI 27.55 kg/m2 General: thin female, well-nourished, no acute distress HEENT: Sclerae are anicteric, conjunctiva pink. Oral mucosa intact Lungs: Clear Heart: Regular Abdomen: soft, nontender, nondistended, no obvious ascites, no peritoneal signs, normal bowel sounds. No organomegaly. Extremities: No edema Psychiatric: alert and oriented x3. Cooperative    ASSESSMENT:  #1. "Diarrhea". Patient with 1-2 bowel movements daily. Occasionally loose. He easily controlled with Imodium. Continues on  Magnesium oxide. Difficult to know by history she truly has significant diarrhea. Question bacterial overgrowth. #2. Weight loss. Etiology uncertain #3. Chronic renal insufficiency status post renal transplantation #4. Chronic Coumadin therapy #5. History of adenomatous colon polyps. Need to review [facility]  workup to decide when next followup would be most appropriate  PLAN:  #1. Obtain records from [facility] for review #2. Empiric trial of metronidazole 250 mg by mouth 3 times a day x10 days for possible bacterial overgrowth or occult  infection #3. Advised to have her PT/INR checked in the Coumadin clinic in 5 days since we are initiating metronidazole #4. GI office visit in 2-3 weeks to see how she is doing and review [facility] workup #5. Ongoing general medical care with her nephrologist. Question the need for magnesium oxide as this clearly can cause diarrhea.  ADDENDUM Records from [facility] received. The patient underwent a FLEXIBLE SIGMOIDOSCOPY with BIOPSY on 07/09/2011. The examination was only carried out to 30 cm. The mucosa was said to be entirely NORMAL. Random biopsies from that region were taken and returned NORMAL

## 2011-09-13 ENCOUNTER — Encounter (HOSPITAL_COMMUNITY)
Admission: RE | Admit: 2011-09-13 | Discharge: 2011-09-13 | Disposition: A | Discharge status: Home / Self Care | Payer: Medicare Other | Source: Ambulatory Visit | Attending: Nephrology | Admitting: Nephrology

## 2011-09-13 DIAGNOSIS — M329 Systemic lupus erythematosus, unspecified: Secondary | ICD-10-CM | POA: Insufficient documentation

## 2011-09-13 DIAGNOSIS — D638 Anemia in other chronic diseases classified elsewhere: Secondary | ICD-10-CM | POA: Insufficient documentation

## 2011-09-13 DIAGNOSIS — Z940 Kidney transplant status: Secondary | ICD-10-CM | POA: Insufficient documentation

## 2011-09-13 LAB — IRON AND TIBC
Saturation Ratios: 20 % (ref 20–55)
TIBC: 194 ug/dL — ABNORMAL LOW (ref 250–470)
UIBC: 156 ug/dL (ref 125–400)

## 2011-09-13 LAB — POCT HEMOGLOBIN-HEMACUE: Hemoglobin: 11.3 g/dL — ABNORMAL LOW (ref 12.0–15.0)

## 2011-09-13 MED ORDER — EPOETIN ALFA 10000 UNIT/ML IJ SOLN: 20000.0000 [IU] | INTRAMUSCULAR | Status: DC

## 2011-09-26 ENCOUNTER — Ambulatory Visit: Payer: Medicare Other | Admitting: Internal Medicine

## 2011-10-03 ENCOUNTER — Encounter: Payer: Self-pay | Admitting: Internal Medicine

## 2011-10-03 ENCOUNTER — Ambulatory Visit (INDEPENDENT_AMBULATORY_CARE_PROVIDER_SITE_OTHER): Payer: Medicare Other | Admitting: Internal Medicine

## 2011-10-03 VITALS — BP 120/60 | HR 64 | Ht 61.0 in | Wt 145.0 lb

## 2011-10-03 DIAGNOSIS — Z86010 Personal history of colonic polyps: Secondary | ICD-10-CM

## 2011-10-03 DIAGNOSIS — R634 Abnormal weight loss: Secondary | ICD-10-CM

## 2011-10-03 DIAGNOSIS — R197 Diarrhea, unspecified: Secondary | ICD-10-CM

## 2011-10-03 NOTE — Patient Instructions (Addendum)
Please discontinue your Magnesium supplement per Dr. Marina Goodell   Please follow up with Dr. Marina Goodell in 4 weeks

## 2011-10-03 NOTE — Progress Notes (Signed)
HISTORY OF PRESENT ILLNESS:  Debbie [name] is a 61 y.o. 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 Patient is also status post cholecystectomy. She has a history of adenomatous colon polyps with previous colonoscopies in 1999 and 2006. Also, upper endoscopy in 2006 revealing incidental distal esophageal stricture. She had not been seen in the office for some time until 06/20/2011. At that time she saw the nurse practitioner for diarrhea. See that dictation. She was subsequently seen at [facility]. As part of her workup (I did receive those records) she underwent a flexible sigmoidoscopy to 30 cm on 07/09/2011. This was grossly normal. Biopsies of the colonic mucosa were also normal with no evidence of macroscopic or microscopic colitis. I subsequently saw her in the office 09/06/2011. See that dictation. She was empirically treated with metronidazole 250 mg 3 times a day and asked to followup at this time. She is accompanied by her daughter. She tells me that she is dramatically improved. She reports one to 2 bowel movements per day which are formed. Rarely having loose stools. She is no longer requiring antidiarrheals. Her appetite is excellent and weight stable. No new symptoms. She does continue on magnesium oxide. Her nephrologist, Dr.Fox, improved holding this if needed. No new complaints. GI review of systems is negative.   REVIEW OF SYSTEMS:  All non-GI ROS negative except for visual change  Past Medical History  Diagnosis Date  . Kidney transplant as cause of abnormal reaction or later complication   . Renal disorder   . Hypertension   . Glaucoma   . Colon polyps   . Gall stones 1980  . Hematoma   . Anti-phospholipid antibody syndrome     (Based on hospital discharge summary march 2013)  . ESRD (end stage renal disease)   . Atrial fibrillation   .  Umbilical hernia 1998  . Carpal tunnel syndrome 2003    lt    Past Surgical History  Procedure Date  . Nephrectomy transplanted organ 2010  . Cholecystectomy 1980  . Umbilical hernia repair 1998    Social History Debbie [name]  reports that she quit smoking about 34 years ago. She has never used smokeless tobacco. She reports that she does not drink alcohol or use illicit drugs.  family history includes Heart disease in her mother and Hypertension in her mother.  Allergies  Allergen Reactions  . Gentamycin (Gentamicin Sulfate) Itching and Swelling  . Penicillins Itching  . Vancomycin Itching and Swelling       PHYSICAL EXAMINATION:  Vital signs: BP 120/60  Pulse 64  Ht 5\' 1"  (1.549 m)  Wt 145 lb (65.772 kg)  BMI 27.40 kg/m2 General: Well-developed, well-nourished, no acute distress HEENT: Sclerae are anicteric, conjunctiva pink. Oral mucosa intact Lungs: Clear Heart: Regular Abdomen: soft, nontender, nondistended, no obvious ascites, no peritoneal signs, normal bowel sounds. No organomegaly. Extremities: No edema Psychiatric: alert and oriented x3. Cooperative     ASSESSMENT:  #1. Diarrhea. Improved after course of metronidazole. Question bacterial overgrowth or occult infection #2. Weight loss. Stable over the past 4 weeks #3. Multiple medical problems including prior renal transplantation in chronic Coumadin therapy #4. History of adenomatous colon polyps. Previous colonoscopies 1999 and 2006. Overdue for followup   PLAN:  #1. Discontinue magnesium oxide #2. Routine followup in 4 weeks to assure that the patient has had a sustained response as well as stable  weight. If not, then additional workup including endoscopic evaluations and/or advanced imaging. However, if she has demonstrated a sustained response and is continuing to improve, surveillance colonoscopy only. #3. Ongoing continuity care with Dr. Caryn Section

## 2011-10-04 ENCOUNTER — Encounter (HOSPITAL_COMMUNITY)
Admission: RE | Admit: 2011-10-04 | Discharge: 2011-10-04 | Disposition: A | Discharge status: Home / Self Care | Payer: Medicare Other | Source: Ambulatory Visit | Attending: Nephrology | Admitting: Nephrology

## 2011-10-04 DIAGNOSIS — M329 Systemic lupus erythematosus, unspecified: Secondary | ICD-10-CM | POA: Insufficient documentation

## 2011-10-04 DIAGNOSIS — Z940 Kidney transplant status: Secondary | ICD-10-CM | POA: Insufficient documentation

## 2011-10-04 DIAGNOSIS — D638 Anemia in other chronic diseases classified elsewhere: Secondary | ICD-10-CM | POA: Insufficient documentation

## 2011-10-04 MED ORDER — EPOETIN ALFA 20000 UNIT/ML IJ SOLN
INTRAMUSCULAR | Status: AC
  Administered 2011-10-04: 20000 [IU] via SUBCUTANEOUS
  Filled 2011-10-04: qty 1

## 2011-10-04 MED ORDER — EPOETIN ALFA 10000 UNIT/ML IJ SOLN: 20000.0000 [IU] | INTRAMUSCULAR | Status: DC

## 2011-10-08 ENCOUNTER — Other Ambulatory Visit (HOSPITAL_COMMUNITY): Payer: Self-pay | Admitting: *Deleted

## 2011-10-09 ENCOUNTER — Encounter (HOSPITAL_COMMUNITY)
Admission: RE | Admit: 2011-10-09 | Discharge: 2011-10-09 | Disposition: A | Discharge status: Home / Self Care | Payer: Medicare Other | Source: Ambulatory Visit | Attending: Nephrology | Admitting: Nephrology

## 2011-10-09 MED ORDER — SODIUM CHLORIDE 0.9 % IV SOLN
INTRAVENOUS | Status: DC
  Administered 2011-10-09: 13:00:00 via INTRAVENOUS

## 2011-10-09 MED ORDER — FERUMOXYTOL INJECTION 510 MG/17 ML
510.0000 mg | INTRAVENOUS | Status: DC
  Administered 2011-10-09: 510 mg via INTRAVENOUS
  Filled 2011-10-09: qty 17

## 2011-10-17 ENCOUNTER — Encounter (HOSPITAL_COMMUNITY)
Admission: RE | Admit: 2011-10-17 | Discharge: 2011-10-17 | Disposition: A | Discharge status: Home / Self Care | Payer: Medicare Other | Source: Ambulatory Visit | Attending: Nephrology | Admitting: Nephrology

## 2011-10-17 MED ORDER — SODIUM CHLORIDE 0.9 % IV SOLN
INTRAVENOUS | Status: AC
  Administered 2011-10-17: 12:00:00 via INTRAVENOUS

## 2011-10-17 MED ORDER — FERUMOXYTOL INJECTION 510 MG/17 ML
510.0000 mg | INTRAVENOUS | Status: AC
  Administered 2011-10-17: 510 mg via INTRAVENOUS

## 2011-10-17 MED ORDER — FERUMOXYTOL INJECTION 510 MG/17 ML
INTRAVENOUS | Status: AC
  Administered 2011-10-17: 510 mg via INTRAVENOUS
  Filled 2011-10-17: qty 17

## 2011-11-01 ENCOUNTER — Encounter (HOSPITAL_COMMUNITY)
Admission: RE | Admit: 2011-11-01 | Discharge: 2011-11-01 | Disposition: A | Discharge status: Home / Self Care | Payer: Medicare Other | Source: Ambulatory Visit | Attending: Nephrology | Admitting: Nephrology

## 2011-11-01 ENCOUNTER — Other Ambulatory Visit (HOSPITAL_COMMUNITY): Payer: Self-pay | Admitting: *Deleted

## 2011-11-01 DIAGNOSIS — Z940 Kidney transplant status: Secondary | ICD-10-CM | POA: Insufficient documentation

## 2011-11-01 DIAGNOSIS — D638 Anemia in other chronic diseases classified elsewhere: Secondary | ICD-10-CM | POA: Insufficient documentation

## 2011-11-01 DIAGNOSIS — M329 Systemic lupus erythematosus, unspecified: Secondary | ICD-10-CM | POA: Insufficient documentation

## 2011-11-01 LAB — COMPREHENSIVE METABOLIC PANEL
AST: 32 U/L (ref 0–37)
Albumin: 3.7 g/dL (ref 3.5–5.2)
Chloride: 104 mEq/L (ref 96–112)
Creatinine, Ser: 1.52 mg/dL — ABNORMAL HIGH (ref 0.50–1.10)
Potassium: 4.5 mEq/L (ref 3.5–5.1)
Total Bilirubin: 0.3 mg/dL (ref 0.3–1.2)
Total Protein: 6.8 g/dL (ref 6.0–8.3)

## 2011-11-01 LAB — CBC
Hemoglobin: 12.3 g/dL (ref 12.0–15.0)
MCH: 26.3 pg (ref 26.0–34.0)
RBC: 4.67 MIL/uL (ref 3.87–5.11)
WBC: 4.2 10*3/uL (ref 4.0–10.5)

## 2011-11-01 LAB — PROTIME-INR
INR: 1.78 — ABNORMAL HIGH (ref 0.00–1.49)
Prothrombin Time: 20.1 seconds — ABNORMAL HIGH (ref 11.6–15.2)

## 2011-11-01 LAB — PHOSPHORUS: Phosphorus: 3.2 mg/dL (ref 2.3–4.6)

## 2011-11-01 LAB — IRON AND TIBC: Saturation Ratios: 31 % (ref 20–55)

## 2011-11-01 MED ORDER — EPOETIN ALFA 10000 UNIT/ML IJ SOLN: 20000.0000 [IU] | INTRAMUSCULAR | Status: DC

## 2011-11-02 ENCOUNTER — Encounter: Payer: Self-pay | Admitting: Internal Medicine

## 2011-11-02 ENCOUNTER — Ambulatory Visit (INDEPENDENT_AMBULATORY_CARE_PROVIDER_SITE_OTHER): Payer: Medicare Other | Admitting: Internal Medicine

## 2011-11-02 ENCOUNTER — Ambulatory Visit: Payer: Medicare Other | Admitting: Internal Medicine

## 2011-11-02 VITALS — BP 104/60 | HR 70 | Ht 61.0 in | Wt 143.0 lb

## 2011-11-02 DIAGNOSIS — R634 Abnormal weight loss: Secondary | ICD-10-CM

## 2011-11-02 DIAGNOSIS — D689 Coagulation defect, unspecified: Secondary | ICD-10-CM

## 2011-11-02 DIAGNOSIS — R197 Diarrhea, unspecified: Secondary | ICD-10-CM

## 2011-11-02 DIAGNOSIS — Z86010 Personal history of colonic polyps: Secondary | ICD-10-CM

## 2011-11-02 MED ORDER — MOVIPREP 100 G PO SOLR: 1.0000 | Freq: Once | ORAL | Status: DC

## 2011-11-02 NOTE — Patient Instructions (Addendum)
You have been scheduled for an endoscopy and colonoscopy with propofol. Please follow the written instructions given to you at your visit today. Please pick up your prep at the pharmacy within the next 1-3 days. If you use inhalers (even only as needed), please bring them with you on the day of your procedure.  Please stay on your coumadin prior to your procedure but be sure to have a PT/INR 2 days before the procedure.  If the results are normal, you may go ahead and prep as directed

## 2011-11-02 NOTE — Progress Notes (Signed)
HISTORY OF PRESENT ILLNESS:  Debbie [name] is a 61 y.o. female with a history of end-stage renal disease status post renal transplant, hypertension, atrial fibrillation and antiphospholipid antibody syndrome for which she is on chronic Coumadin. She is also status post cholecystectomy. She has a history of adenomatous colon polyps with previous colonoscopies in 1999 and 2006. No polyps in 2006. Upper endoscopy in 2006 with incidental asymptomatic distal esophageal stricture. Recent evaluations in this office for diarrhea, weight loss, and the need for surveillance colonoscopy. She returns today for followup. She is accompanied by her daughter. She tells me that her bowels are doing well. Rare episodes of loose stool. Generally formed. Still with good appetite but 2 pound weight loss since last visit. No new complaints.  REVIEW OF SYSTEMS:  All non-GI ROS negative   Past Medical History  Diagnosis Date  . Kidney transplant as cause of abnormal reaction or later complication   . Renal disorder   . Hypertension   . Glaucoma(365)   . Colon polyps   . Gall stones 1980  . Hematoma   . Anti-phospholipid antibody syndrome     (Based on hospital discharge summary march 2013)  . ESRD (end stage renal disease)   . Atrial fibrillation   . Umbilical hernia 1998  . Carpal tunnel syndrome 2003    lt  . Esophageal stricture   . Hemorrhoids     Past Surgical History  Procedure Date  . Nephrectomy transplanted organ 2010  . Cholecystectomy 1980  . Umbilical hernia repair 1998    Social History [name]  reports that she quit smoking about 34 years ago. She has never used smokeless tobacco. She reports that she does not drink alcohol or use illicit drugs.  family history includes Heart disease in her mother and Hypertension in her mother.  Allergies  Allergen Reactions  . Gentamycin (Gentamicin Sulfate) Itching and Swelling  . Penicillins Itching  . Vancomycin Itching and Swelling        PHYSICAL EXAMINATION: Vital signs: BP 104/60  Pulse 70  Ht 5\' 1"  (1.549 m)  Wt 143 lb (64.864 kg)  BMI 27.02 kg/m2  SpO2 99% General: Well-developed, well-nourished, no acute distress HEENT: Sclerae are anicteric, conjunctiva pink. Oral mucosa intact Lungs: Clear Heart: Regular Abdomen: soft, nontender, nondistended, no obvious ascites, no peritoneal signs, normal bowel sounds. No organomegaly. Extremities: No edema Psychiatric: alert and oriented x3. Cooperative    ASSESSMENT:  #1. Recent problems with diarrhea. Response to metronidazole. Possibly bacterial overgrowth or infectious agent #2. Weight loss. Etiology unclear. Continues despite good appetite #3. History of adenomatous colon polyps. Due for surveillance #4. Multiple medical problems leading renal transplantation for which she is on immunosuppressive therapy. Also antiphospholipid antibody syndrome for which she is on Coumadin   PLAN:  #1. Upper endoscopy and colonoscopy to evaluate weight loss and provide neoplasia surveillance.The nature of the procedure, as well as the risks, benefits, and alternatives were carefully and thoroughly reviewed with the patient. Ample time for discussion and questions allowed. The patient understood, was satisfied, and agreed to proceed. The patient is high-risk given her comorbidities. We discussed in detail the pros and cons of proceeding on and off Coumadin therapy. We will proceed with the examination on Coumadin. However, she will have her INR checked 2 days prior to the examination to make sure that is not supratherapeutic.

## 2011-11-15 ENCOUNTER — Encounter (HOSPITAL_COMMUNITY): Payer: Medicare Other

## 2011-11-23 ENCOUNTER — Encounter (HOSPITAL_COMMUNITY)
Admission: RE | Admit: 2011-11-23 | Discharge: 2011-11-23 | Disposition: A | Discharge status: Home / Self Care | Payer: Medicare Other | Source: Ambulatory Visit | Attending: Nephrology | Admitting: Nephrology

## 2011-11-23 DIAGNOSIS — M329 Systemic lupus erythematosus, unspecified: Secondary | ICD-10-CM | POA: Insufficient documentation

## 2011-11-23 DIAGNOSIS — Z940 Kidney transplant status: Secondary | ICD-10-CM | POA: Insufficient documentation

## 2011-11-23 DIAGNOSIS — D638 Anemia in other chronic diseases classified elsewhere: Secondary | ICD-10-CM | POA: Insufficient documentation

## 2011-11-23 LAB — POCT HEMOGLOBIN-HEMACUE: Hemoglobin: 12.1 g/dL (ref 12.0–15.0)

## 2011-11-23 MED ORDER — EPOETIN ALFA 10000 UNIT/ML IJ SOLN: 20000.0000 [IU] | INTRAMUSCULAR | Status: DC

## 2011-11-28 ENCOUNTER — Telehealth: Payer: Self-pay | Admitting: Internal Medicine

## 2011-11-28 NOTE — Telephone Encounter (Signed)
Patient wanted to clarify when she was supposed to start drinking first batch of prep before procedure tomorrow.  I told her start drinking it at 5 tonight and the second batch at 9am tomorrow.  Patient acknowledged understanding.

## 2011-11-28 NOTE — Telephone Encounter (Signed)
Verlon Au can you please call this patient and review instructions

## 2011-11-29 ENCOUNTER — Ambulatory Visit (AMBULATORY_SURGERY_CENTER): Payer: Medicare Other | Admitting: Internal Medicine

## 2011-11-29 ENCOUNTER — Encounter: Payer: Self-pay | Admitting: Internal Medicine

## 2011-11-29 VITALS — BP 142/79 | HR 70 | Temp 97.9°F | Resp 16 | Ht 61.0 in | Wt 143.0 lb

## 2011-11-29 DIAGNOSIS — R197 Diarrhea, unspecified: Secondary | ICD-10-CM

## 2011-11-29 DIAGNOSIS — K222 Esophageal obstruction: Secondary | ICD-10-CM

## 2011-11-29 DIAGNOSIS — R634 Abnormal weight loss: Secondary | ICD-10-CM

## 2011-11-29 DIAGNOSIS — K2900 Acute gastritis without bleeding: Secondary | ICD-10-CM

## 2011-11-29 DIAGNOSIS — Z1211 Encounter for screening for malignant neoplasm of colon: Secondary | ICD-10-CM

## 2011-11-29 DIAGNOSIS — Z86010 Personal history of colonic polyps: Secondary | ICD-10-CM

## 2011-11-29 DIAGNOSIS — D1330 Benign neoplasm of unspecified part of small intestine: Secondary | ICD-10-CM

## 2011-11-29 DIAGNOSIS — K2990 Gastroduodenitis, unspecified, without bleeding: Secondary | ICD-10-CM

## 2011-11-29 DIAGNOSIS — K2970 Gastritis, unspecified, without bleeding: Secondary | ICD-10-CM

## 2011-11-29 MED ORDER — SODIUM CHLORIDE 0.9 % IV SOLN: 500.0000 mL | INTRAVENOUS | Status: DC

## 2011-11-29 NOTE — Patient Instructions (Addendum)

## 2011-11-29 NOTE — Op Note (Signed)
[facility] [address] [place], [address]   COLONOSCOPY PROCEDURE REPORT  PATIENT: Debbie [name], Debbie [name]  MR#: [id number] BIRTHDATE: 07/30/1950 , 61  yrs. old GENDER: Female ENDOSCOPIST: Roxy Cedar, MD REFERRED [id number] Program Recall PROCEDURE DATE:  11/29/2011 PROCEDURE:   Colonoscopy, surveillance ASA CLASS:   Class III INDICATIONS:patient's personal history of adenomatous colon polyps (1999 w/ TA, 2006 negative). MEDICATIONS: MAC sedation, administered by CRNA and propofol (Diprivan) 250mg  IV  DESCRIPTION OF PROCEDURE:   After the risks benefits and alternatives of the procedure were thoroughly explained, informed consent was obtained.  A digital rectal exam revealed no abnormalities of the rectum.   The LB CF-[id number]  endoscope was introduced through the anus and advanced to the cecum, which was identified by the ileocecal valve. No adverse events experienced.   The quality of the prep was excellent, using MoviPrep  The instrument was then slowly withdrawn as the colon was fully examined.      COLON FINDINGS: A normal appearing  ileocecal valve and partial view of the cecum without visualization of appendiceal orifice (despite numerous manuvers and efforts)were identified.  The ascending, hepatic flexure, transverse, splenic flexure, descending, sigmoid colon and rectum appeared unremarkable.  No polyps or cancers were seen.  Retroflexed views revealed internal hemorrhoids. The time to cecum=9 minutes 30 seconds.  Withdrawal time=8 minutes 24 seconds. The scope was withdrawn and the procedure completed. COMPLICATIONS: There were no complications.  ENDOSCOPIC IMPRESSION: 1. Normal colon  RECOMMENDATIONS: 1.  Await biopsy results 2. Repeat colonoscopy in 5 years (HX POLYPS) 2.  If biopsies are unrevealing, then a CT scan of chest, abdomen and pelvis may be needed to furthe evaluate weight loss in immunosupressed patient with good  appetite. I WILL LEAVE THIS TO DR FOX GIVEN HER RENAL ISSUES.    eSigned:  Roxy Cedar, MD 11/29/2011 3:33 PM   cc: Marina Gravel, MD and The Patient

## 2011-11-29 NOTE — Progress Notes (Signed)
Patient did not have preoperative order for IV antibiotic SSI prophylaxis. (G8918)  Patient did not experience any of the following events: a burn prior to discharge; a fall within the facility; wrong site/side/patient/procedure/implant event; or a hospital transfer or hospital admission upon discharge from the facility. (G8907)  

## 2011-11-29 NOTE — Op Note (Signed)
[facility] [address] [place], [address]   ENDOSCOPY PROCEDURE REPORT  PATIENT: Debbie [name], Debbie [name]  MR#: [id number] BIRTHDATE: 1950-04-29 , 61  yrs. old GENDER: Female ENDOSCOPIST: Roxy Cedar, MD REFERRED BY:  .  Self / Office PROCEDURE DATE:  11/29/2011 PROCEDURE:  EGD w/ biopsies ASA CLASS:     Class III INDICATIONS:  unexplained diarrhea.   weight loss. MEDICATIONS: MAC sedation, administered by CRNA and propofol (Diprivan) 200mg  IV TOPICAL ANESTHETIC: Cetacaine Spray  DESCRIPTION OF PROCEDURE: After the risks benefits and alternatives of the procedure were thoroughly explained, informed consent was obtained.  The LB-GIF Q180 [id number] endoscope was introduced through the mouth and advanced to the third portion of the duodenum. Without limitations.  The instrument was slowly withdrawn as the mucosa was fully examined.      The esophagus revealed a benign ringlike stricture in the distal portion at the GE junction.  mild active esophagitis present.  the gastric mucosa was atrophic.  the posterior body revealed past like erythema consistent with nonspecific gastritis.  biopsies taken. the duodenal bulb was normal.  post bulbar duodenum revealed questionable mild scalloping.  Biopsies taken.  a retroflexed view revealed a hiatal hernia.         The scope was then withdrawn from the patient and the procedure completed.  COMPLICATIONS: There were no complications. ENDOSCOPIC IMPRESSION: 1. Mild esophagitis and peptic stricture 2. Mild gastritis 3. Otherwise normal EGD  RECOMMENDATIONS: 1.  Await biopsy results 2.  Proceed with a Colonoscopy today.  REPEAT EXAM:  eSigned:  Roxy Cedar, MD 11/29/2011 3:21 PM   [id number] Caryn Section, MD and The Patient

## 2011-11-30 ENCOUNTER — Telehealth: Payer: Self-pay | Admitting: *Deleted

## 2011-11-30 NOTE — Telephone Encounter (Signed)
No answer, left message to call if questions or concerns. 

## 2011-12-05 ENCOUNTER — Encounter: Payer: Self-pay | Admitting: Internal Medicine

## 2011-12-19 ENCOUNTER — Other Ambulatory Visit (HOSPITAL_COMMUNITY): Payer: Self-pay | Admitting: *Deleted

## 2011-12-21 ENCOUNTER — Encounter (HOSPITAL_COMMUNITY): Payer: Medicare Other

## 2011-12-25 ENCOUNTER — Other Ambulatory Visit: Payer: Self-pay | Admitting: Nephrology

## 2011-12-25 DIAGNOSIS — Z1231 Encounter for screening mammogram for malignant neoplasm of breast: Secondary | ICD-10-CM

## 2012-01-15 ENCOUNTER — Inpatient Hospital Stay (HOSPITAL_COMMUNITY): Admission: RE | Admit: 2012-01-15 | Payer: Medicare Other | Source: Ambulatory Visit

## 2012-01-29 ENCOUNTER — Encounter (HOSPITAL_COMMUNITY)
Admission: RE | Admit: 2012-01-29 | Discharge: 2012-01-29 | Disposition: A | Discharge status: Home / Self Care | Payer: Medicare Other | Source: Ambulatory Visit | Attending: Nephrology | Admitting: Nephrology

## 2012-01-29 DIAGNOSIS — D638 Anemia in other chronic diseases classified elsewhere: Secondary | ICD-10-CM | POA: Insufficient documentation

## 2012-01-29 DIAGNOSIS — M329 Systemic lupus erythematosus, unspecified: Secondary | ICD-10-CM | POA: Insufficient documentation

## 2012-01-29 DIAGNOSIS — Z940 Kidney transplant status: Secondary | ICD-10-CM | POA: Insufficient documentation

## 2012-01-29 LAB — IRON AND TIBC
Iron: 60 ug/dL (ref 42–135)
Saturation Ratios: 31 % (ref 20–55)
UIBC: 136 ug/dL (ref 125–400)

## 2012-01-29 MED ORDER — EPOETIN ALFA 10000 UNIT/ML IJ SOLN: 20000.0000 [IU] | INTRAMUSCULAR | Status: DC

## 2012-01-29 NOTE — Progress Notes (Signed)
Pt did not need injection today because her hgb was 11.4.  I tried to give her another appt in 2 weeks per Dr. Scherrie Gerlach orders and she refused saying she would not come because she had other appts.  I asked her if another day would work better for her that week and she stated she would not come back until February, in 4 weeks.  I explained to her that the Dr wants her to come in 2 weeks when she does not get a shot because her hgb would drop and she still refused.

## 2012-01-31 ENCOUNTER — Ambulatory Visit: Payer: Medicare Other

## 2012-02-26 ENCOUNTER — Encounter (HOSPITAL_COMMUNITY)
Admission: RE | Admit: 2012-02-26 | Discharge: 2012-02-26 | Disposition: A | Discharge status: Home / Self Care | Payer: Medicare Other | Source: Ambulatory Visit | Attending: Nephrology | Admitting: Nephrology

## 2012-02-26 DIAGNOSIS — Z940 Kidney transplant status: Secondary | ICD-10-CM | POA: Insufficient documentation

## 2012-02-26 DIAGNOSIS — D638 Anemia in other chronic diseases classified elsewhere: Secondary | ICD-10-CM | POA: Insufficient documentation

## 2012-02-26 DIAGNOSIS — M329 Systemic lupus erythematosus, unspecified: Secondary | ICD-10-CM | POA: Insufficient documentation

## 2012-02-26 LAB — IRON AND TIBC
Iron: 69 ug/dL (ref 42–135)
Saturation Ratios: 32 % (ref 20–55)
TIBC: 217 ug/dL — ABNORMAL LOW (ref 250–470)

## 2012-02-26 MED ORDER — EPOETIN ALFA 10000 UNIT/ML IJ SOLN: 20000.0000 [IU] | INTRAMUSCULAR | Status: DC

## 2012-02-28 ENCOUNTER — Telehealth: Payer: Self-pay | Admitting: Internal Medicine

## 2012-02-28 NOTE — Telephone Encounter (Signed)
Left message for Debbie [name] to call back.

## 2012-02-29 NOTE — Telephone Encounter (Signed)
Left message for pt to call back  °

## 2012-03-03 NOTE — Telephone Encounter (Signed)
Pt is taking coumadin and is having diarrhea. Dr. Jinny Sanders states the medication is not being absorbed correctly and his level are being affected. Requesting pt to be seen asap. Pt scheduled to see Doug Sou PA tomorrow at 10am. Leonides Sake to notify pt of appt date and time.

## 2012-03-03 NOTE — Telephone Encounter (Signed)
Left message for Debbie [name] to call back. 

## 2012-03-04 ENCOUNTER — Ambulatory Visit: Payer: Medicare Other

## 2012-03-04 ENCOUNTER — Ambulatory Visit (INDEPENDENT_AMBULATORY_CARE_PROVIDER_SITE_OTHER): Payer: Medicare Other | Admitting: Gastroenterology

## 2012-03-04 ENCOUNTER — Encounter: Payer: Self-pay | Admitting: Gastroenterology

## 2012-03-04 VITALS — BP 120/70 | HR 60 | Ht 62.0 in | Wt 141.6 lb

## 2012-03-04 DIAGNOSIS — R197 Diarrhea, unspecified: Secondary | ICD-10-CM

## 2012-03-04 NOTE — Patient Instructions (Addendum)
Per Doug Sou, continue taking Imodium to control your diarrhea.  Keep track of your bowel movements as discussed.  Please follow up with Shanda Bumps in 2- 3 weeks

## 2012-03-04 NOTE — Progress Notes (Signed)
Agree with initial assessment plans for

## 2012-03-04 NOTE — Progress Notes (Signed)
03/04/2012 Debbie [name] [id number] [birth year]   History of Present Illness: Patient is a pleasant 62 year old female who follows with Dr. Marina Goodell.  She presents to the office today with complaints of diarrhea.  This has been an ongoing issue for her for at least the past year or so.  She had a colonoscopy in 11/2011, which was normal.  EGD with duodenal biopsies did not show any evidence of celiac disease.  TSH in 2013 was slightly high.  She says that they symptoms had improved after receiving a course of flagyl, but over the last couple of months diarrhea has returned.  Has about 5 loose BM's in a 24 hour period.  She takes Imodium and it works well for her, but she is afraid that she will get stopped up and did not know if she should be taking it on a regular basis.  She denies abdominal pain, rectal bleeding, etc.  Her weight is down two pounds since her last visit in October 2013.    Current Medications, Allergies, Past Medical History, Past Surgical History, Family History and Social History were reviewed in [facility] record.   Physical Exam: BP 120/70  Pulse 60  Ht 5\' 2"  (1.575 m)  Wt 141 lb 9.6 oz (64.229 kg)  BMI 25.89 kg/m2 General: Well developed, black female in no acute distress Head: Normocephalic and atraumatic Eyes:  sclerae anicteric, conjunctiva pink  Ears: Normal auditory acuity Lungs: Clear throughout to auscultation Heart: Regular rate and rhythm Abdomen: Soft, non-tender and non-distended. No masses, no hepatomegaly. Normal bowel sounds.  Cholecystectomy and renal transplant scars noted. Rectal: Deferred. Musculoskeletal: Symmetrical with no gross deformities  Extremities: No edema  Neurological: Alert oriented x 4, grossly nonfocal Psychological:  Alert and cooperative. Normal mood and affect  Assessment and Recommendations: -Diarrhea, recurrent:  Evaluation negative previously.  Likely secondary to IBS/SIBO or bile salt related diarrhea.   Imodium works well for her, but she was unsure if she could take if regularly.  She will continue the Imodium, maybe trying to take a dose every other day, regulating it as she needs.  Could consider questran, probiotics, repeat course of flgyl, or trial of xifaxan in the future.  Will see her back in two or three weeks to see how she is doing with regulating the imodium.

## 2012-03-11 ENCOUNTER — Encounter (HOSPITAL_COMMUNITY): Payer: Medicare Other

## 2012-03-16 ENCOUNTER — Encounter (HOSPITAL_COMMUNITY): Payer: Self-pay | Admitting: Emergency Medicine

## 2012-03-16 ENCOUNTER — Emergency Department (HOSPITAL_COMMUNITY)
Admission: EM | Admit: 2012-03-16 | Discharge: 2012-03-16 | Disposition: A | Discharge status: Home / Self Care | Payer: Medicare Other | Attending: Emergency Medicine | Admitting: Emergency Medicine

## 2012-03-16 DIAGNOSIS — Z8719 Personal history of other diseases of the digestive system: Secondary | ICD-10-CM | POA: Insufficient documentation

## 2012-03-16 DIAGNOSIS — Z79899 Other long term (current) drug therapy: Secondary | ICD-10-CM | POA: Insufficient documentation

## 2012-03-16 DIAGNOSIS — H409 Unspecified glaucoma: Secondary | ICD-10-CM | POA: Insufficient documentation

## 2012-03-16 DIAGNOSIS — Z8679 Personal history of other diseases of the circulatory system: Secondary | ICD-10-CM | POA: Insufficient documentation

## 2012-03-16 DIAGNOSIS — Z87891 Personal history of nicotine dependence: Secondary | ICD-10-CM | POA: Insufficient documentation

## 2012-03-16 DIAGNOSIS — R42 Dizziness and giddiness: Secondary | ICD-10-CM

## 2012-03-16 DIAGNOSIS — Z940 Kidney transplant status: Secondary | ICD-10-CM | POA: Insufficient documentation

## 2012-03-16 DIAGNOSIS — I129 Hypertensive chronic kidney disease with stage 1 through stage 4 chronic kidney disease, or unspecified chronic kidney disease: Secondary | ICD-10-CM | POA: Insufficient documentation

## 2012-03-16 DIAGNOSIS — Z7901 Long term (current) use of anticoagulants: Secondary | ICD-10-CM | POA: Insufficient documentation

## 2012-03-16 DIAGNOSIS — Z7982 Long term (current) use of aspirin: Secondary | ICD-10-CM | POA: Insufficient documentation

## 2012-03-16 DIAGNOSIS — R197 Diarrhea, unspecified: Secondary | ICD-10-CM | POA: Insufficient documentation

## 2012-03-16 DIAGNOSIS — H9190 Unspecified hearing loss, unspecified ear: Secondary | ICD-10-CM | POA: Insufficient documentation

## 2012-03-16 DIAGNOSIS — H9319 Tinnitus, unspecified ear: Secondary | ICD-10-CM | POA: Insufficient documentation

## 2012-03-16 DIAGNOSIS — N186 End stage renal disease: Secondary | ICD-10-CM | POA: Insufficient documentation

## 2012-03-16 MED ORDER — MECLIZINE HCL 25 MG PO TABS
25.0000 mg | ORAL_TABLET | Freq: Once | ORAL | Status: AC
  Administered 2012-03-16: 25 mg via ORAL
  Filled 2012-03-16: qty 1

## 2012-03-16 MED ORDER — MECLIZINE HCL 25 MG PO TABS: 25.0000 mg | ORAL_TABLET | Freq: Three times a day (TID) | ORAL | Status: DC | PRN

## 2012-03-16 NOTE — ED Notes (Addendum)
Felt like the room was spinning around off/on all day. Negative stroke scale.

## 2012-03-16 NOTE — ED Provider Notes (Signed)
History     CSN: [id number]  Arrival date & time 03/16/12  0041   First MD Initiated Contact with Patient 03/16/12 0057      Chief Complaint  Patient presents with  . Dizziness    (Consider location/radiation/quality/duration/timing/severity/associated sxs/prior treatment) HPI CHAILYN RACETTE is a 62 y.o. female with a history of end-stage renal disease, previously dialysis dependent who is now status post kidney transplant doing well, presents with dizziness. She noticed this first this morning when she woke up. She says she tobacco covers swung her legs over to the left side of the bed and sat up quickly looking to the left, while she did this she had the sudden onset Of counterclockwise spinning vertigo, she denies any nausea vomiting, she had no headache pain or neck pain during this episode, she had no fevers and no chills.  Patient does relate some right hearing decrease as well as some ringing in her right ear it has been ongoing for some time. She noticed throughout the day whenever she turns her head to the left she gets the symptoms. She also describes about a year's worth of diarrhea which has been chronic and her physicians think it is linked to her antirejection medication. She denies any chest pains, shortness of breath, recent illness, weakness, numbness or tingling or any other focal neurologic deficits.   Past Medical History  Diagnosis Date  . Kidney transplant as cause of abnormal reaction or later complication   . Renal disorder   . Hypertension   . Glaucoma(365)   . Colon polyps   . Gall stones 1980  . Hematoma   . Anti-phospholipid antibody syndrome     (Based on hospital discharge summary march 2013)  . ESRD (end stage renal disease)   . Atrial fibrillation   . Umbilical hernia 1998  . Carpal tunnel syndrome 2003    lt  . Esophageal stricture   . Hemorrhoids     Past Surgical History  Procedure Laterality Date  . Nephrectomy transplanted organ  2010  .  Cholecystectomy  1980  . Umbilical hernia repair  1998    Family History  Problem Relation Age of Onset  . Hypertension Mother   . Heart disease Mother   . Colon cancer Neg Hx   . Rectal cancer Neg Hx   . Stomach cancer Neg Hx     History  Substance Use Topics  . Smoking status: Former Smoker -- 1.00 packs/day for 30 years    Quit date: 04/16/1977  . Smokeless tobacco: Never Used  . Alcohol Use: No    OB History   Grav Para Term Preterm Abortions TAB SAB Ect Mult Living                  Review of Systems At least 10pt or greater review of systems completed and are negative except where specified in the HPI.  Allergies  Gentamycin; Penicillins; and Vancomycin  Home Medications   Current Outpatient Rx  Name  Route  Sig  Dispense  Refill  . acetaminophen (TYLENOL) 325 MG tablet   Oral   Take 2 tablets (650 mg total) by mouth every 6 (six) hours as needed (or Fever >/= 101).         . ALPHAGAN P 0.1 % SOLN   Intraocular   Inject 1 drop into the eye every morning.          Marland Kitchen aspirin EC 81 MG tablet   Oral  Take 81 mg by mouth daily.         Marland Kitchen epoetin alfa (EPOGEN,PROCRIT) 16109 UNIT/ML injection   Subcutaneous   Inject 20,000 Units into the skin every 30 (thirty) days.          . furosemide (LASIX) 20 MG tablet   Oral   Take 20 mg by mouth daily as needed. Excess edema         . labetalol (NORMODYNE) 200 MG tablet   Oral   Take 100 mg by mouth daily.          Marland Kitchen LUMIGAN 0.01 % SOLN               . mycophenolate (MYFORTIC) 180 MG EC tablet   Oral   Take 360 mg by mouth daily.          . ranitidine (ZANTAC) 150 MG tablet   Oral   Take 150 mg by mouth at bedtime.         . sodium bicarbonate 650 MG tablet   Oral   Take 650 mg by mouth 2 (two) times daily.         . tacrolimus (PROGRAF) 1 MG capsule   Oral   Take 3-4 mg by mouth 2 (two) times daily. 3 caps in am and 2 caps in pm.         . timolol (BETIMOL) 0.5 % ophthalmic  solution   Both Eyes   Place 1 drop into both eyes 2 (two) times daily.         Marland Kitchen warfarin (COUMADIN) 2.5 MG tablet   Oral   Take 2.5 mg by mouth daily.           BP 154/70  Pulse 70  Temp(Src) 97.5 F (36.4 C) (Oral)  Resp 20  SpO2 100%  Physical Exam  Nursing notes reviewed.  Electronic medical record reviewed. VITAL SIGNS:   Filed Vitals:   03/16/12 0046  BP: 154/70  Pulse: 70  Temp: 97.5 F (36.4 C)  TempSrc: Oral  Resp: 20  SpO2: 100%   CONSTITUTIONAL: Awake, oriented, appears non-toxic HENT: Atraumatic, normocephalic, oral mucosa pink and moist, airway patent. Nares patent without drainage. External ears normal. EYES: Conjunctiva clear, EOMI, PERRLA NECK: Trachea midline, non-tender, supple CARDIOVASCULAR: Normal heart rate, Normal rhythm, soft systolic murmur PULMONARY/CHEST: Clear to auscultation, no rhonchi, wheezes, or rales. Symmetrical breath sounds. Non-tender. ABDOMINAL: Non-distended, soft, non-tender - no rebound or guarding.  BS normal. NEUROLOGIC: UE:AVWUJW fields intact. PERRLA, EOMI.  Facial sensation equal to light touch bilaterally.  Good muscle bulk in the masseter muscle and good lateral movement of the jaw.  Facial expressions equal and good strength with smile/frown and puffed cheeks.  Hearing grossly intact to finger rub test, however is decreased on the right.  Uvula, tongue are midline with no deviation. Symmetrical palate elevation.  Trapezius and SCM muscles are 5/5 strength bilaterally.   DTR: Brachioradialis, biceps, patellar, Achilles tendon reflexes 2+ bilaterally.  No clonus. Strength: 5/5 strength flexors and extensors in the upper and lower extremities.  Grip strength, finger adduction/abduction 5/5. Sensation: Sensation intact distally to light touch Patient walks unassisted Positive Dix-Hallpike to the left, nystagmus and symptoms seen, fatigable. EXTREMITIES: No clubbing, cyanosis, or edema SKIN: Warm, Dry, No erythema, No  rash  ED Course  Procedures (including critical care time)  Labs Reviewed - No data to display No results found.   1. Vertigo     MDM  Jacinta Shoe is  a 62 y.o. female presenting with vertigo. Patient's symptoms are consistent with peripheral vertigo. Nothing in the patient's history suggests a central vertigo, there are no strokelike symptoms, no headaches to suggest dissection or bleeding, no fevers or chills or headache to suggest central nervous system infection. Patient's TMs are clear bilaterally. No suggestion of craniofacial is pain syndrome, trigeminal neuralgia, sinusitis, or any other infectio  Patient symptoms of vertigo Are reproducible with the same maneuver moving head to the left and down, they are fatigable, got better with meclizine, and are improved on fixing the eyes on the near object.  I do not think any laboratory studies are indicated at this time, patient is had no bleeding, no lightheadedness, no near-syncope/syncope, chest pain, shortness of breath or any other symptoms that would signify any other pathology. Patient is had no flank pain, denied any dysuria or frequency.  She is vital signs are stable and within normal limits.  We'll send the patient home with meclizine. I have given the patient extensive instructions in how to perform the Epley maneuver at home with assistance from her daughter or husband. We'll have the patient followup with ENT, because she did have some tinnitus and some decreased hearing in her right ear. 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  I explained the diagnosis and have given explicit precautions to return to the ER including fevers, persistent vomiting, headache, neck pain any other new or worsening symptoms. The patient understands and accepts the medical plan as it's been dictated  and I have answered their questions. Discharge instructions concerning home care and prescriptions have been given.  The patient is STABLE and is discharged to home in good condition.          Jones Skene, MD 03/16/12 0403

## 2012-03-21 ENCOUNTER — Telehealth: Payer: Self-pay

## 2012-03-21 NOTE — Telephone Encounter (Signed)
Called message per Jessica's instructions at last office visit to schedule follow up appointment.  Patient agreed to come in 04-01-12 at 10:45am.

## 2012-03-21 NOTE — Telephone Encounter (Signed)
Message copied by Jeanine Luz on Fri Mar 21, 2012 10:54 AM ------      Message from: Jeanine Luz      Created: Tue Mar 04, 2012 10:49 AM       Call patient to schedule appointment first week of March ------

## 2012-03-27 ENCOUNTER — Ambulatory Visit: Payer: Medicare Other

## 2012-04-01 ENCOUNTER — Encounter: Payer: Self-pay | Admitting: Gastroenterology

## 2012-04-01 ENCOUNTER — Ambulatory Visit (INDEPENDENT_AMBULATORY_CARE_PROVIDER_SITE_OTHER): Payer: Medicare Other | Admitting: Gastroenterology

## 2012-04-01 VITALS — BP 128/80 | HR 66 | Ht 61.0 in | Wt 143.8 lb

## 2012-04-01 DIAGNOSIS — R197 Diarrhea, unspecified: Secondary | ICD-10-CM

## 2012-04-01 NOTE — Progress Notes (Signed)
Agree 

## 2012-04-01 NOTE — Progress Notes (Signed)
04/01/2012 Debbie [name] [id number] 05/10/1950   History of Present Illness: Patient is a pleasant 62 year old female who follows with Dr. Marina Goodell. She presents to the office today for follow-up of her diarrhea.  Below is a summary of her last visit a couple of weeks ago.  "This has been an ongoing issue for her for at least the past year or so. She had a colonoscopy in 11/2011, which was normal. EGD with duodenal biopsies did not show any evidence of celiac disease. TSH in 2013 was slightly high. She says that they symptoms had improved after receiving a course of flagyl, but over the last couple of months diarrhea has returned. Has about 5 loose BM's in a 24 hour period. She takes Imodium and it works well for her, but she is afraid that she will get stopped up and did not know if she should be taking it on a regular basis. She denies abdominal pain, rectal bleeding, etc. Her weight is down two pounds since her last visit in October 2013."  It was determined at that time that she would just continue with the Imodium as needed for treatment of IBS or bile-salt related diarrhea since this works well for her.  Today she says that she is doing about the same as far as the diarrhea goes, but thinks that she has been able to control it well with the Imodium.  No abdominal pain or blood in stool.  Appetite is good and she has gained two pounds since her last visit.    Current Medications, Allergies, Past Medical History, Past Surgical History, Family History and Social History were reviewed in [facility] record.   Physical Exam: BP 128/80  Pulse 66  Ht 5\' 1"  (1.549 m)  Wt 143 lb 12.8 oz (65.227 kg)  BMI 27.18 kg/m2  SpO2 98% General: Well developed, black female in no acute distress Head: Normocephalic and atraumatic Eyes:  sclerae anicteric, conjunctiva pink  Ears: Normal auditory acuity Lungs: Clear throughout to auscultation Heart: Regular rate and rhythm Abdomen: Soft,  non-tender and non-distended. No masses, no hepatomegaly. Normal bowel sounds.  Cholecystectomy and renal transplant scars noted. Rectal: Deferred. Musculoskeletal: Symmetrical with no gross deformities  Extremities: No edema  Neurological: Alert oriented x 4, grossly nonfocal Psychological:  Alert and cooperative. Normal mood and affect  Assessment and Recommendations: -Diarrhea, recurrent: Evaluation negative previously. Likely secondary to IBS/SIBO or bile salt related diarrhea. Imodium works well for her and she will continue taking the Imodium prn.  She will follow-up as needed.  Could consider questran, probiotics, repeat course of flgyl, or trial of xifaxan in the future if another regimen needs to be considered.

## 2012-04-01 NOTE — Patient Instructions (Addendum)
Follow up with Dr. Marina Goodell as needed.  Continue your Imodium as needed.  Thank you for choosing [facility].

## 2012-04-14 ENCOUNTER — Other Ambulatory Visit (HOSPITAL_COMMUNITY): Payer: Self-pay | Admitting: *Deleted

## 2012-04-15 ENCOUNTER — Encounter (HOSPITAL_COMMUNITY): Payer: Medicare Other

## 2012-04-18 ENCOUNTER — Ambulatory Visit
Admission: RE | Admit: 2012-04-18 | Discharge: 2012-04-18 | Disposition: A | Discharge status: Home / Self Care | Payer: Medicare Other | Source: Ambulatory Visit | Attending: Nephrology | Admitting: Nephrology

## 2012-04-18 DIAGNOSIS — Z1231 Encounter for screening mammogram for malignant neoplasm of breast: Secondary | ICD-10-CM

## 2012-04-23 ENCOUNTER — Encounter (HOSPITAL_COMMUNITY)
Admission: RE | Admit: 2012-04-23 | Discharge: 2012-04-23 | Disposition: A | Discharge status: Home / Self Care | Payer: Medicare Other | Source: Ambulatory Visit | Attending: Nephrology | Admitting: Nephrology

## 2012-04-23 DIAGNOSIS — D638 Anemia in other chronic diseases classified elsewhere: Secondary | ICD-10-CM | POA: Insufficient documentation

## 2012-04-23 DIAGNOSIS — Z940 Kidney transplant status: Secondary | ICD-10-CM | POA: Insufficient documentation

## 2012-04-23 DIAGNOSIS — M329 Systemic lupus erythematosus, unspecified: Secondary | ICD-10-CM | POA: Insufficient documentation

## 2012-04-23 LAB — POCT HEMOGLOBIN-HEMACUE: Hemoglobin: 11.5 g/dL — ABNORMAL LOW (ref 12.0–15.0)

## 2012-04-23 LAB — IRON AND TIBC
Iron: 56 ug/dL (ref 42–135)
Saturation Ratios: 27 % (ref 20–55)
TIBC: 206 ug/dL — ABNORMAL LOW (ref 250–470)
UIBC: 150 ug/dL (ref 125–400)

## 2012-04-23 MED ORDER — EPOETIN ALFA 10000 UNIT/ML IJ SOLN: 20000.0000 [IU] | INTRAMUSCULAR | Status: DC

## 2012-04-24 ENCOUNTER — Encounter (HOSPITAL_COMMUNITY): Payer: Medicare Other

## 2012-05-07 ENCOUNTER — Encounter (HOSPITAL_COMMUNITY): Payer: Medicare Other

## 2012-06-04 ENCOUNTER — Encounter (HOSPITAL_COMMUNITY): Payer: Medicare Other

## 2012-06-11 ENCOUNTER — Encounter (HOSPITAL_COMMUNITY)
Admission: RE | Admit: 2012-06-11 | Discharge: 2012-06-11 | Disposition: A | Discharge status: Home / Self Care | Payer: Medicare Other | Source: Ambulatory Visit | Attending: Nephrology | Admitting: Nephrology

## 2012-06-11 DIAGNOSIS — D638 Anemia in other chronic diseases classified elsewhere: Secondary | ICD-10-CM | POA: Insufficient documentation

## 2012-06-11 DIAGNOSIS — Z940 Kidney transplant status: Secondary | ICD-10-CM | POA: Insufficient documentation

## 2012-06-11 DIAGNOSIS — M329 Systemic lupus erythematosus, unspecified: Secondary | ICD-10-CM | POA: Insufficient documentation

## 2012-06-11 LAB — IRON AND TIBC
Iron: 70 ug/dL (ref 42–135)
Saturation Ratios: 32 % (ref 20–55)
UIBC: 152 ug/dL (ref 125–400)

## 2012-06-11 MED ORDER — EPOETIN ALFA 10000 UNIT/ML IJ SOLN: 20000.0000 [IU] | INTRAMUSCULAR | Status: DC

## 2012-07-09 ENCOUNTER — Encounter (HOSPITAL_COMMUNITY): Payer: Medicare Other

## 2012-07-17 ENCOUNTER — Other Ambulatory Visit (HOSPITAL_COMMUNITY): Payer: Self-pay | Admitting: *Deleted

## 2012-07-18 ENCOUNTER — Encounter (HOSPITAL_COMMUNITY): Payer: Medicare Other

## 2012-12-04 ENCOUNTER — Emergency Department (HOSPITAL_COMMUNITY)
Admission: EM | Admit: 2012-12-04 | Discharge: 2012-12-04 | Disposition: A | Discharge status: Home / Self Care | Payer: Medicare Other | Attending: Emergency Medicine | Admitting: Emergency Medicine

## 2012-12-04 ENCOUNTER — Encounter (HOSPITAL_COMMUNITY): Payer: Self-pay | Admitting: Emergency Medicine

## 2012-12-04 DIAGNOSIS — M7989 Other specified soft tissue disorders: Secondary | ICD-10-CM

## 2012-12-04 DIAGNOSIS — Z860100 Personal history of colon polyps, unspecified: Secondary | ICD-10-CM | POA: Insufficient documentation

## 2012-12-04 DIAGNOSIS — Z87891 Personal history of nicotine dependence: Secondary | ICD-10-CM | POA: Insufficient documentation

## 2012-12-04 DIAGNOSIS — Z992 Dependence on renal dialysis: Secondary | ICD-10-CM | POA: Insufficient documentation

## 2012-12-04 DIAGNOSIS — Z7982 Long term (current) use of aspirin: Secondary | ICD-10-CM | POA: Insufficient documentation

## 2012-12-04 DIAGNOSIS — M79609 Pain in unspecified limb: Secondary | ICD-10-CM

## 2012-12-04 DIAGNOSIS — I120 Hypertensive chronic kidney disease with stage 5 chronic kidney disease or end stage renal disease: Secondary | ICD-10-CM | POA: Insufficient documentation

## 2012-12-04 DIAGNOSIS — Z8719 Personal history of other diseases of the digestive system: Secondary | ICD-10-CM | POA: Insufficient documentation

## 2012-12-04 DIAGNOSIS — Z86718 Personal history of other venous thrombosis and embolism: Secondary | ICD-10-CM | POA: Insufficient documentation

## 2012-12-04 DIAGNOSIS — M25539 Pain in unspecified wrist: Secondary | ICD-10-CM | POA: Insufficient documentation

## 2012-12-04 DIAGNOSIS — Z940 Kidney transplant status: Secondary | ICD-10-CM | POA: Insufficient documentation

## 2012-12-04 DIAGNOSIS — Z7901 Long term (current) use of anticoagulants: Secondary | ICD-10-CM | POA: Insufficient documentation

## 2012-12-04 DIAGNOSIS — I4891 Unspecified atrial fibrillation: Secondary | ICD-10-CM | POA: Insufficient documentation

## 2012-12-04 DIAGNOSIS — Z880 Allergy status to penicillin: Secondary | ICD-10-CM | POA: Insufficient documentation

## 2012-12-04 DIAGNOSIS — M79601 Pain in right arm: Secondary | ICD-10-CM

## 2012-12-04 DIAGNOSIS — Z79899 Other long term (current) drug therapy: Secondary | ICD-10-CM | POA: Insufficient documentation

## 2012-12-04 DIAGNOSIS — Z862 Personal history of diseases of the blood and blood-forming organs and certain disorders involving the immune mechanism: Secondary | ICD-10-CM | POA: Insufficient documentation

## 2012-12-04 DIAGNOSIS — H409 Unspecified glaucoma: Secondary | ICD-10-CM | POA: Insufficient documentation

## 2012-12-04 DIAGNOSIS — N186 End stage renal disease: Secondary | ICD-10-CM | POA: Insufficient documentation

## 2012-12-04 LAB — CBC WITH DIFFERENTIAL/PLATELET
Basophils Relative: 0 % (ref 0–1)
Eosinophils Absolute: 0 10*3/uL (ref 0.0–0.7)
Hemoglobin: 12 g/dL (ref 12.0–15.0)
MCH: 29.2 pg (ref 26.0–34.0)
MCHC: 33.9 g/dL (ref 30.0–36.0)
Monocytes Relative: 6 % (ref 3–12)
Neutrophils Relative %: 62 % (ref 43–77)
Platelets: 145 10*3/uL — ABNORMAL LOW (ref 150–400)

## 2012-12-04 LAB — COMPREHENSIVE METABOLIC PANEL
ALT: 12 U/L (ref 0–35)
AST: 22 U/L (ref 0–37)
Alkaline Phosphatase: 89 U/L (ref 39–117)
CO2: 19 mEq/L (ref 19–32)
Calcium: 9.8 mg/dL (ref 8.4–10.5)
Chloride: 103 mEq/L (ref 96–112)
GFR calc Af Amer: 36 mL/min — ABNORMAL LOW (ref >90)
GFR calc non Af Amer: 31 mL/min — ABNORMAL LOW (ref >90)
Glucose, Bld: 100 mg/dL — ABNORMAL HIGH (ref 70–99)
Potassium: 4.6 mEq/L (ref 3.5–5.1)
Sodium: 134 mEq/L — ABNORMAL LOW (ref 135–145)
Total Bilirubin: 0.5 mg/dL (ref 0.3–1.2)

## 2012-12-04 MED ORDER — OXYCODONE-ACETAMINOPHEN 5-325 MG PO TABS
1.0000 | ORAL_TABLET | Freq: Once | ORAL | Status: AC
  Administered 2012-12-04: 1 via ORAL
  Filled 2012-12-04: qty 1

## 2012-12-04 MED ORDER — OXYCODONE-ACETAMINOPHEN 5-325 MG PO TABS: 1.0000 | ORAL_TABLET | ORAL | Status: DC | PRN

## 2012-12-04 MED ORDER — DIPHENHYDRAMINE HCL 25 MG PO CAPS
25.0000 mg | ORAL_CAPSULE | Freq: Once | ORAL | Status: AC
  Administered 2012-12-04: 25 mg via ORAL
  Filled 2012-12-04: qty 1

## 2012-12-04 NOTE — ED Notes (Signed)
Pt has active dialysis graft in right upper arm and an inactive graft in right forearm. No on dialysis since kidney transplant in 2012. Started 2 days ago with numbness to right hand. Right hand warm with good color and positive radial pulse.

## 2012-12-04 NOTE — Progress Notes (Signed)
Right upper extremity venous duplex:  No evidence of DVT or superficial thrombosis.    

## 2012-12-04 NOTE — ED Notes (Addendum)
Pt to ED c/o R forearm pain and swelling x 3 days.  Hx of multiple graft placements for dialysis (no longer used since pt had transplant).  R swollen and pt states they feel numb.  Pt on coumadin for hx of dvt and afib.

## 2012-12-04 NOTE — ED Provider Notes (Signed)
Medical screening examination/treatment/procedure(s) were performed by non-physician practitioner and as supervising physician I was immediately available for consultation/collaboration.   Charles B. Sheldon, MD 12/04/12 2355 

## 2012-12-04 NOTE — ED Provider Notes (Signed)
CSN: [id number]     Arrival date & time 12/04/12  1627 History   First MD Initiated Contact with Patient 12/04/12 2030     Chief Complaint  Patient presents with  . Arm Pain   (Consider location/radiation/quality/duration/timing/severity/associated sxs/prior Treatment) The history is provided by the patient and medical records.   This is a 62 year old female with past medical history significant for hypertension, AFIB, s/p renal transplant, presenting to the ED for right forearm pain and swelling x 3 days.  Pt has upper and lower RUE dialysis grafts in place, only upper RUE graft is functional.  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  Since then, she has had intermittent numbness and paresthesias of her right thumb, index, and middle fingers.  Pt states she feels her right proximal forearm and right hand is swollen and painful, described as an "aching sensation".  Denies any recent injury, trauma, or falls.  Denies any RUE weakness or changes in grip strength.  States she is concerned for possible DVT, states she has a hx of this.  Currently on coumadin for DVT hx and AFIB.    Past Medical History  Diagnosis Date  . Kidney transplant as cause of abnormal reaction or later complication   . Renal disorder   . Hypertension   . Glaucoma   . Colon polyps   . Gall stones 1980  . Hematoma   . Anti-phospholipid antibody syndrome     (Based on hospital discharge summary march 2013)  . ESRD (end stage renal disease)   . Atrial fibrillation   . Umbilical hernia 1998  . Carpal tunnel syndrome 2003    lt  . Esophageal stricture   . Hemorrhoids    Past Surgical History  Procedure Laterality Date  . Nephrectomy transplanted organ  2010  . Cholecystectomy  1980  . Umbilical hernia repair  1998   Family History  Problem Relation Age of Onset  . Hypertension Mother   . Heart disease Mother   . Colon cancer Neg Hx   . Rectal cancer  Neg Hx   . Stomach cancer Neg Hx    History  Substance Use Topics  . Smoking status: Former Smoker -- 1.00 packs/day for 30 years    Quit date: 04/16/1977  . Smokeless tobacco: Never Used  . Alcohol Use: No   OB History   Grav Para Term Preterm Abortions TAB SAB Ect Mult Living                 Review of Systems  Musculoskeletal: Positive for arthralgias and joint swelling.  All other systems reviewed and are negative.    Allergies  Gentamycin; Penicillins; and Vancomycin  Home Medications   Current Outpatient Rx  Name  Route  Sig  Dispense  Refill  . ALPHAGAN P 0.1 % SOLN   Both Eyes   Place 1 drop into both eyes at bedtime.          Marland Kitchen aspirin EC 81 MG tablet   Oral   Take 81 mg by mouth daily.         . calcium-vitamin D (OSCAL WITH D) 500-200 MG-UNIT per tablet   Oral   Take 1 tablet by mouth daily.         . furosemide (LASIX) 20 MG tablet   Oral   Take 20 mg by mouth daily as needed. Excess edema         .  labetalol (NORMODYNE) 200 MG tablet   Oral   Take 100 mg by mouth daily.          Marland Kitchen LUMIGAN 0.01 % SOLN   Both Eyes   Place 1 drop into both eyes at bedtime.          . mycophenolate (MYFORTIC) 180 MG EC tablet   Oral   Take 360 mg by mouth daily.          . ranitidine (ZANTAC) 150 MG tablet   Oral   Take 150 mg by mouth daily.          . sodium bicarbonate 650 MG tablet   Oral   Take 650 mg by mouth 2 (two) times daily.         . tacrolimus (PROGRAF) 1 MG capsule   Oral   Take 3-4 mg by mouth See admin instructions. 4 caps in am and 4 caps in pm.         . timolol (BETIMOL) 0.5 % ophthalmic solution   Both Eyes   Place 1 drop into both eyes 2 (two) times daily.         Marland Kitchen warfarin (COUMADIN) 2.5 MG tablet   Oral   Take 2.5 mg by mouth daily. 2.5mg  Mon-Fri and 1.25mg  Sat & Sun          BP 200/80  Pulse 74  Temp(Src) 97.7 F (36.5 C) (Oral)  Resp 18  SpO2 100%  Physical Exam  Nursing note and vitals  reviewed. Constitutional: She is oriented to person, place, and time. She appears well-developed and well-nourished.  HENT:  Head: Normocephalic and atraumatic.  Mouth/Throat: Oropharynx is clear and moist.  Eyes: Conjunctivae and EOM are normal. Pupils are equal, round, and reactive to light.  Neck: Normal range of motion.  Cardiovascular: Normal rate, regular rhythm and normal heart sounds.   Pulmonary/Chest: Effort normal and breath sounds normal.  Abdominal: Soft. Bowel sounds are normal.  Musculoskeletal: Normal range of motion.  RUE with working dialysis graft in upper arm- strong thrill; overlying skin normal in appearance Forearm with old dialysis graft, non-functional; right proximal volar forearm mildly TTP along ulnar aspect; overlying skin normal in appearance and without warmth to touch Right hand does not appear swollen or erythematous; full ROM of wrist and fingers maintained; normal grip strength; strong radial pulse and cap refill; diffuse sensation intact  Neurological: She is alert and oriented to person, place, and time. She has normal strength. She displays no tremor. No cranial nerve deficit or sensory deficit. She displays no seizure activity.  CN grossly intact, moves all extremities appropriately without ataxia, sensation to light touch intact diffusely; no focal neuro deficits or facial droop appreciated  Skin: Skin is warm and dry.  Psychiatric: She has a normal mood and affect.    ED Course  Procedures (including critical care time) Labs Review Labs Reviewed  CBC WITH DIFFERENTIAL - Abnormal; Notable for the following:    HCT 35.4 (*)    Platelets 145 (*)    All other components within normal limits  COMPREHENSIVE METABOLIC PANEL - Abnormal; Notable for the following:    Sodium 134 (*)    Glucose, Bld 100 (*)    BUN 31 (*)    Creatinine, Ser 1.70 (*)    GFR calc non Af Amer 31 (*)    GFR calc Af Amer 36 (*)    All other components within normal limits   PROTIME-INR - Abnormal; Notable for  the following:    Prothrombin Time 18.0 (*)    INR 1.53 (*)    All other components within normal limits   Imaging Review No results found.  Right upper extremity venous duplex: No evidence of DVT or superficial thrombosis.   EKG Interpretation   None       MDM   1. Arm pain, right    Pts working dialysis graft does have a strong thrill present.  Pts right forearm has non-functional dialysis graft still present.  Right forearm nor hand appear swollen, erythematous, or exquisitely warm to touch.  Pt has full ROM of right hand, elbow, and shoulder.  She has strong grip strength and moves extremity without ataxia.  Labs as above-- coumadin sub-therapeutic at 1.53-- discussed this with pt, states she often has trouble keeping it within range.  Right upper extremity venous study negative for DVT or superficial thrombosis.  Pt given percocet with good improvement of her pain, Rx for the same.  Pt has previously scheduled FU appt with her PCP on Monday-- advised to have him re-check her arm and coumadin level.  Discussed plan with pt and family, they agreed.  Return precautions advised.  Garlon Hatchet, PA-C 12/04/12 2340

## 2013-01-07 ENCOUNTER — Ambulatory Visit
Admission: RE | Admit: 2013-01-07 | Discharge: 2013-01-07 | Disposition: A | Discharge status: Home / Self Care | Payer: Medicare Other | Source: Ambulatory Visit | Attending: Nephrology | Admitting: Nephrology

## 2013-01-07 ENCOUNTER — Other Ambulatory Visit: Payer: Self-pay | Admitting: Nephrology

## 2013-01-07 DIAGNOSIS — R05 Cough: Secondary | ICD-10-CM

## 2013-06-30 ENCOUNTER — Emergency Department (HOSPITAL_COMMUNITY): Payer: Medicare Other

## 2013-06-30 ENCOUNTER — Inpatient Hospital Stay (HOSPITAL_COMMUNITY)
Admission: EM | Admit: 2013-06-30 | Discharge: 2013-07-06 | DRG: 311 | Disposition: A | Discharge status: Home / Self Care | Payer: Medicare Other | Attending: Internal Medicine | Admitting: Internal Medicine

## 2013-06-30 ENCOUNTER — Encounter (HOSPITAL_COMMUNITY): Payer: Self-pay | Admitting: Emergency Medicine

## 2013-06-30 DIAGNOSIS — Z8249 Family history of ischemic heart disease and other diseases of the circulatory system: Secondary | ICD-10-CM

## 2013-06-30 DIAGNOSIS — Z860100 Personal history of colon polyps, unspecified: Secondary | ICD-10-CM

## 2013-06-30 DIAGNOSIS — I208 Other forms of angina pectoris: Secondary | ICD-10-CM | POA: Diagnosis present

## 2013-06-30 DIAGNOSIS — Z7901 Long term (current) use of anticoagulants: Secondary | ICD-10-CM

## 2013-06-30 DIAGNOSIS — N183 Chronic kidney disease, stage 3 unspecified: Secondary | ICD-10-CM

## 2013-06-30 DIAGNOSIS — D6859 Other primary thrombophilia: Secondary | ICD-10-CM | POA: Diagnosis present

## 2013-06-30 DIAGNOSIS — I4891 Unspecified atrial fibrillation: Secondary | ICD-10-CM

## 2013-06-30 DIAGNOSIS — I129 Hypertensive chronic kidney disease with stage 1 through stage 4 chronic kidney disease, or unspecified chronic kidney disease: Secondary | ICD-10-CM | POA: Diagnosis present

## 2013-06-30 DIAGNOSIS — I10 Essential (primary) hypertension: Secondary | ICD-10-CM | POA: Diagnosis present

## 2013-06-30 DIAGNOSIS — H409 Unspecified glaucoma: Secondary | ICD-10-CM | POA: Diagnosis present

## 2013-06-30 DIAGNOSIS — Z87891 Personal history of nicotine dependence: Secondary | ICD-10-CM

## 2013-06-30 DIAGNOSIS — I480 Paroxysmal atrial fibrillation: Secondary | ICD-10-CM | POA: Diagnosis present

## 2013-06-30 DIAGNOSIS — T8610 Unspecified complication of kidney transplant: Secondary | ICD-10-CM | POA: Diagnosis present

## 2013-06-30 DIAGNOSIS — R079 Chest pain, unspecified: Secondary | ICD-10-CM

## 2013-06-30 DIAGNOSIS — Z940 Kidney transplant status: Secondary | ICD-10-CM

## 2013-06-30 DIAGNOSIS — Z7982 Long term (current) use of aspirin: Secondary | ICD-10-CM

## 2013-06-30 DIAGNOSIS — D649 Anemia, unspecified: Secondary | ICD-10-CM | POA: Diagnosis present

## 2013-06-30 DIAGNOSIS — I209 Angina pectoris, unspecified: Principal | ICD-10-CM | POA: Diagnosis present

## 2013-06-30 DIAGNOSIS — N184 Chronic kidney disease, stage 4 (severe): Secondary | ICD-10-CM | POA: Diagnosis present

## 2013-06-30 DIAGNOSIS — I2089 Other forms of angina pectoris: Secondary | ICD-10-CM | POA: Diagnosis present

## 2013-06-30 DIAGNOSIS — M329 Systemic lupus erythematosus, unspecified: Secondary | ICD-10-CM | POA: Diagnosis present

## 2013-06-30 HISTORY — DX: Unspecified osteoarthritis, unspecified site: M19.90

## 2013-06-30 HISTORY — DX: Dependence on renal dialysis: Z99.2

## 2013-06-30 HISTORY — DX: End stage renal disease: N18.6

## 2013-06-30 HISTORY — DX: Pneumonia, unspecified organism: J18.9

## 2013-06-30 HISTORY — DX: Acute embolism and thrombosis of unspecified deep veins of unspecified lower extremity: I82.409

## 2013-06-30 HISTORY — DX: Personal history of other medical treatment: Z92.89

## 2013-06-30 LAB — BASIC METABOLIC PANEL
BUN: 29 mg/dL — ABNORMAL HIGH (ref 6–23)
CO2: 21 mEq/L (ref 19–32)
Calcium: 10 mg/dL (ref 8.4–10.5)
Chloride: 106 mEq/L (ref 96–112)
Creatinine, Ser: 1.87 mg/dL — ABNORMAL HIGH (ref 0.50–1.10)
GFR calc Af Amer: 32 mL/min — ABNORMAL LOW (ref >90)
GFR calc non Af Amer: 27 mL/min — ABNORMAL LOW (ref >90)
Glucose, Bld: 97 mg/dL (ref 70–99)
Potassium: 4.6 mEq/L (ref 3.7–5.3)
Sodium: 141 mEq/L (ref 137–147)

## 2013-06-30 LAB — CBC
HCT: 32.1 % — ABNORMAL LOW (ref 36.0–46.0)
Hemoglobin: 10.5 g/dL — ABNORMAL LOW (ref 12.0–15.0)
MCH: 27.2 pg (ref 26.0–34.0)
MCHC: 32.7 g/dL (ref 30.0–36.0)
MCV: 83.2 fL (ref 78.0–100.0)
Platelets: 151 10*3/uL (ref 150–400)
RBC: 3.86 MIL/uL — ABNORMAL LOW (ref 3.87–5.11)
RDW: 14 % (ref 11.5–15.5)
WBC: 5 10*3/uL (ref 4.0–10.5)

## 2013-06-30 LAB — I-STAT TROPONIN, ED: TROPONIN I, POC: 0.12 ng/mL — AB (ref 0.00–0.08)

## 2013-06-30 LAB — PROTIME-INR
INR: 2.2 — ABNORMAL HIGH (ref 0.00–1.49)
Prothrombin Time: 23.7 seconds — ABNORMAL HIGH (ref 11.6–15.2)

## 2013-06-30 LAB — TROPONIN I: Troponin I: <0.3 ng/mL (ref <0.30)

## 2013-06-30 MED ORDER — FUROSEMIDE 20 MG PO TABS
20.0000 mg | ORAL_TABLET | Freq: Every day | ORAL | Status: DC
  Administered 2013-07-01 – 2013-07-05 (×4): 20 mg via ORAL
  Filled 2013-06-30 (×5): qty 1

## 2013-06-30 MED ORDER — FAMOTIDINE 40 MG PO TABS
40.0000 mg | ORAL_TABLET | Freq: Every day | ORAL | Status: DC
Start: 2013-06-30 — End: 2013-07-05
  Administered 2013-07-01 – 2013-07-05 (×5): 40 mg via ORAL
  Filled 2013-06-30 (×6): qty 1

## 2013-06-30 MED ORDER — WARFARIN SODIUM 2.5 MG PO TABS
2.5000 mg | ORAL_TABLET | Freq: Once | ORAL | Status: AC
  Administered 2013-06-30: 2.5 mg via ORAL
  Filled 2013-06-30 (×2): qty 1

## 2013-06-30 MED ORDER — WARFARIN - PHARMACIST DOSING INPATIENT
Freq: Every day | Status: DC
  Administered 2013-07-01 – 2013-07-05 (×3)

## 2013-06-30 MED ORDER — TACROLIMUS 1 MG PO CAPS: 3.0000 mg | ORAL_CAPSULE | ORAL | Status: DC

## 2013-06-30 MED ORDER — BRIMONIDINE TARTRATE 0.2 % OP SOLN
1.0000 [drp] | Freq: Three times a day (TID) | OPHTHALMIC | Status: DC
  Administered 2013-06-30 – 2013-07-05 (×14): 1 [drp] via OPHTHALMIC
  Filled 2013-06-30: qty 5

## 2013-06-30 MED ORDER — CALCITRIOL 0.25 MCG PO CAPS
0.2500 ug | ORAL_CAPSULE | ORAL | Status: DC
  Administered 2013-06-30 – 2013-07-06 (×4): 0.25 ug via ORAL
  Filled 2013-06-30 (×4): qty 1

## 2013-06-30 MED ORDER — CALCITRIOL 0.5 MCG PO CAPS
0.5000 ug | ORAL_CAPSULE | ORAL | Status: DC
  Administered 2013-07-01 – 2013-07-05 (×3): 0.5 ug via ORAL
  Filled 2013-06-30 (×3): qty 1

## 2013-06-30 MED ORDER — ASPIRIN 81 MG PO CHEW
324.0000 mg | CHEWABLE_TABLET | Freq: Once | ORAL | Status: AC
  Administered 2013-06-30: 324 mg via ORAL
  Filled 2013-06-30: qty 4

## 2013-06-30 MED ORDER — ASPIRIN EC 81 MG PO TBEC
81.0000 mg | DELAYED_RELEASE_TABLET | Freq: Every day | ORAL | Status: DC
  Administered 2013-07-01 – 2013-07-06 (×6): 81 mg via ORAL
  Filled 2013-06-30 (×6): qty 1

## 2013-06-30 MED ORDER — SODIUM BICARBONATE 650 MG PO TABS
650.0000 mg | ORAL_TABLET | Freq: Two times a day (BID) | ORAL | Status: DC
  Administered 2013-06-30 – 2013-07-06 (×12): 650 mg via ORAL
  Filled 2013-06-30 (×13): qty 1

## 2013-06-30 MED ORDER — TIMOLOL MALEATE 0.5 % OP SOLN
1.0000 [drp] | Freq: Two times a day (BID) | OPHTHALMIC | Status: DC
  Administered 2013-07-01 – 2013-07-05 (×7): 1 [drp] via OPHTHALMIC
  Filled 2013-06-30: qty 5

## 2013-06-30 MED ORDER — LABETALOL HCL 200 MG PO TABS
200.0000 mg | ORAL_TABLET | Freq: Every day | ORAL | Status: DC
  Administered 2013-07-01 – 2013-07-05 (×5): 200 mg via ORAL
  Filled 2013-06-30 (×6): qty 1

## 2013-06-30 MED ORDER — ONDANSETRON HCL 4 MG/2ML IJ SOLN
4.0000 mg | Freq: Three times a day (TID) | INTRAMUSCULAR | Status: AC | PRN
Start: 2013-06-30 — End: 2013-07-01

## 2013-06-30 MED ORDER — TACROLIMUS 1 MG PO CAPS
4.0000 mg | ORAL_CAPSULE | Freq: Every day | ORAL | Status: DC
  Administered 2013-07-01 – 2013-07-05 (×5): 4 mg via ORAL
  Filled 2013-06-30 (×6): qty 4

## 2013-06-30 MED ORDER — GI COCKTAIL ~~LOC~~: 30.0000 mL | Freq: Four times a day (QID) | ORAL | Status: DC | PRN

## 2013-06-30 MED ORDER — MYCOPHENOLATE SODIUM 180 MG PO TBEC
180.0000 mg | DELAYED_RELEASE_TABLET | Freq: Two times a day (BID) | ORAL | Status: DC
  Administered 2013-06-30 – 2013-07-06 (×12): 180 mg via ORAL
  Filled 2013-06-30 (×13): qty 1

## 2013-06-30 MED ORDER — LATANOPROST 0.005 % OP SOLN
1.0000 [drp] | Freq: Every day | OPHTHALMIC | Status: DC
  Administered 2013-07-01 – 2013-07-02 (×2): 1 [drp] via OPHTHALMIC
  Filled 2013-06-30: qty 2.5

## 2013-06-30 MED ORDER — TACROLIMUS 1 MG PO CAPS
3.0000 mg | ORAL_CAPSULE | Freq: Every day | ORAL | Status: DC
  Administered 2013-06-30 – 2013-07-04 (×5): 3 mg via ORAL
  Filled 2013-06-30 (×6): qty 3

## 2013-06-30 NOTE — ED Notes (Signed)
Report given to [facility] on 3W.

## 2013-06-30 NOTE — ED Notes (Signed)
IV team at bedside 

## 2013-06-30 NOTE — ED Provider Notes (Signed)
CSN: [id number]     Arrival date & time 06/30/13  1233 History   First MD Initiated Contact with Patient 06/30/13 1244     Chief Complaint  Patient presents with  . 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 Patient notes that the pressure has been more frequent and more prolonged over last several days. Yesterday the patient had 4 episodes of chest pressure lasting greater than 20 minutes. She took ranitidine without relief. Pressure radiating to her left neck. Pressure is not brought on with activity or eating. It was associated with nausea but no shortness of breath, palpitations, lightheadedness. She has not had any abdominal pain or cough. No lower extremity swelling. Patient takes a baby aspirin daily along with Coumadin. Her only risk factor is hypertension. She denies high cholesterol, diabetes, family history of coronary artery disease. Patient has a remote smoking history but she quit in 1979. Her last episode of pressure and at around 4 AM today. The onset of this condition was acute. The course is intermittent. Aggravating factors: none. Alleviating factors: none.    Patient is a 63 y.o. female presenting with chest pain. The history is provided by the patient and medical records.  Chest Pain Associated symptoms: nausea   Associated symptoms: no abdominal pain, no back pain, no cough, no diaphoresis, no fever, no palpitations, no shortness of breath and not vomiting     Past Medical History  Diagnosis Date  . Kidney transplant as cause of abnormal reaction or later complication   . Renal disorder   . Hypertension   . Glaucoma   . Colon polyps   . Gall stones 1980  . Hematoma   . Anti-phospholipid  antibody syndrome     (Based on hospital discharge summary march 2013)  . ESRD (end stage renal disease)   . Atrial fibrillation   . Umbilical hernia 1761  . Carpal tunnel syndrome 2003    lt  . Esophageal stricture   . Hemorrhoids    Past Surgical History  Procedure Laterality Date  . Nephrectomy transplanted organ  2010  . Cholecystectomy  1980  . Umbilical hernia repair  1998   Family History  Problem Relation Age of Onset  . Hypertension Mother   . Heart disease Mother   . Colon cancer Neg Hx   . Rectal cancer Neg Hx   . Stomach cancer Neg Hx    History  Substance Use Topics  . Smoking status: Former Smoker -- 1.00 packs/day for 30 years    Quit date: 04/16/1977  . Smokeless tobacco: Never Used  . Alcohol Use: No   OB History   Grav Para Term Preterm Abortions TAB SAB Ect Mult Living                 Review of Systems  Constitutional: Negative for fever and diaphoresis.  Eyes: Negative for redness.  Respiratory: Negative for cough and shortness of breath.   Cardiovascular: Positive for chest pain. Negative for palpitations and leg swelling.  Gastrointestinal: Positive for nausea. Negative for vomiting and abdominal pain.  Genitourinary: Negative for dysuria.  Musculoskeletal: Negative for back pain and neck pain.  Skin: Negative for rash.  Neurological: Negative for syncope and light-headedness.      Allergies  Gentamycin; Penicillins; and  Vancomycin  Home Medications   Prior to Admission medications   Medication Sig Start Date End Date Taking? Authorizing Provider  ALPHAGAN P 0.1 % SOLN Place 1 drop into both eyes at bedtime.  10/11/11   Historical Provider, MD  aspirin EC 81 MG tablet Take 81 mg by mouth daily.    Historical Provider, MD  calcium-vitamin D (OSCAL WITH D) 500-200 MG-UNIT per tablet Take 1 tablet by mouth daily.    Historical Provider, MD  furosemide (LASIX) 20 MG tablet Take 20 mg by mouth daily as needed. Excess edema    Historical  Provider, MD  labetalol (NORMODYNE) 200 MG tablet Take 100 mg by mouth daily.     Historical Provider, MD  LUMIGAN 0.01 % SOLN Place 1 drop into both eyes at bedtime.  10/30/11   Historical Provider, MD  mycophenolate (MYFORTIC) 180 MG EC tablet Take 360 mg by mouth daily.     Historical Provider, MD  oxyCODONE-acetaminophen (PERCOCET/ROXICET) 5-325 MG per tablet Take 1 tablet by mouth every 4 (four) hours as needed. 12/04/12   Larene Pickett, PA-C  ranitidine (ZANTAC) 150 MG tablet Take 150 mg by mouth daily.     Historical Provider, MD  sodium bicarbonate 650 MG tablet Take 650 mg by mouth 2 (two) times daily.    Historical Provider, MD  tacrolimus (PROGRAF) 1 MG capsule Take 3-4 mg by mouth See admin instructions. 4 caps in am and 4 caps in pm.    Historical Provider, MD  timolol (BETIMOL) 0.5 % ophthalmic solution Place 1 drop into both eyes 2 (two) times daily.    Historical Provider, MD  warfarin (COUMADIN) 2.5 MG tablet Take 2.5 mg by mouth daily. 2.5mg  Mon-Fri and 1.25mg  Sat & Sun    Historical Provider, MD   BP 176/75  Pulse 67  Temp(Src) 98.3 F (36.8 C) (Oral)  Resp 17  SpO2 100% Physical Exam  Nursing note and vitals reviewed. Constitutional: She appears well-developed and well-nourished.  HENT:  Head: Normocephalic and atraumatic.  Mouth/Throat: Mucous membranes are normal. Mucous membranes are not dry.  Eyes: Conjunctivae are normal.  Neck: Trachea normal and normal range of motion. Neck supple. Normal carotid pulses and no JVD present. No muscular tenderness present. Carotid bruit is not present. No tracheal deviation present.  Cardiovascular: Normal rate, regular rhythm, S1 normal, S2 normal, normal heart sounds and intact distal pulses.  Exam reveals no decreased pulses.   No murmur heard. Pulmonary/Chest: Effort normal. No respiratory distress. She has no wheezes. She exhibits no tenderness.  Abdominal: Soft. Normal aorta and bowel sounds are normal. There is no  tenderness. There is no rebound and no guarding.  Musculoskeletal: Normal range of motion.  Neurological: She is alert.  Skin: Skin is warm and dry. She is not diaphoretic. No cyanosis. No pallor.  Psychiatric: She has a normal mood and affect.    ED Course  Procedures (including critical care time) Labs Review Labs Reviewed  CBC - Abnormal; Notable for the following:    RBC 3.86 (*)    Hemoglobin 10.5 (*)    HCT 32.1 (*)    All other components within normal limits  BASIC METABOLIC PANEL - Abnormal; Notable for the following:    BUN 29 (*)    Creatinine, Ser 1.87 (*)    GFR calc non Af Amer 27 (*)    GFR calc Af Amer 32 (*)    All other components within normal limits  PROTIME-INR - Abnormal; Notable for  the following:    Prothrombin Time 23.7 (*)    INR 2.20 (*)    All other components within normal limits  I-STAT TROPOININ, ED - Abnormal; Notable for the following:    Troponin i, poc 0.12 (*)    All other components within normal limits  TROPONIN I  TROPONIN I  TROPONIN I    Imaging Review Dg Chest 2 View  06/30/2013   CLINICAL DATA:  Left-sided chest pain.  EXAM: CHEST  2 VIEW  COMPARISON:  01/07/2013 and 04/17/2011  FINDINGS: Heart size is normal. There is unchanged chronic scarring in the left hemithorax with chronic prominence of the left main pulmonary artery. There is chronic blunting of the left costophrenic angle.  Right lung is clear. There is extensive coronary calcification visible on the lateral view. No acute osseous abnormality. No effusions.  IMPRESSION: No acute abnormalities.   Electronically Signed   By: Rozetta Nunnery M.D.   On: 06/30/2013 13:42     EKG Interpretation   Date/Time:  Tuesday June 30 2013 12:41:22 EDT Ventricular Rate:  67 PR Interval:  152 QRS Duration: 78 QT Interval:  404 QTC Calculation: 426 R Axis:   43 Text Interpretation:  Normal sinus rhythm Normal ECG Confirmed by ZAMMIT   MD, JOSEPH ([id number]) on 06/30/2013 4:05:00 PM       1:25 PM Patient seen and examined. Work-up initiated. D/w Dr. Roderic Palau.   Vital signs reviewed and are as follows: Filed Vitals:   06/30/13 1300  BP: 176/75  Pulse: 67  Temp: 98.3 F (36.8 C)  Resp: 17    Date: 06/30/2013  Rate: 67  Rhythm: normal sinus rhythm  QRS Axis: normal  Intervals: normal  ST/T Wave abnormalities: normal  Conduction Disutrbances:none  Narrative Interpretation:   Old EKG Reviewed: none available  4:04 PM Mild elevation of troponin. Spoke with Wannetta Sender -- cardiology to consult.   Spoke with Dr. Candiss Norse who will see and admit.    MDM   Final diagnoses:  Chest pain   Admit for chest pressure, concern for unstable angina.     Carlisle Cater, PA-C 06/30/13 1605

## 2013-06-30 NOTE — ED Notes (Signed)
Family at bedside. 

## 2013-06-30 NOTE — ED Notes (Signed)
Attempted x2 to call report to 3W

## 2013-06-30 NOTE — ED Notes (Signed)
Joshua,PA at the bedside along with student.

## 2013-06-30 NOTE — ED Notes (Signed)
Pt brought back to room via wheelchair; pt getting undressed and into a gown at this time; Garth Schlatter, RN aware

## 2013-06-30 NOTE — ED Notes (Signed)
Pt denies any N/V, chest pain, no acute signs of respiratory distress. Pt comfortable.

## 2013-06-30 NOTE — H&P (Signed)
Patient Demographics  Debbie [name], is a 63 y.o. female  MRN: [id number]   DOB - [birth year]  Admit Date - 06/30/2013  Outpatient Primary MD for the patient is Geoffry Paradise, MD  Nephrologist and PCP now is Dr. Jimmy Footman   With History of -  Past Medical History  Diagnosis Date  . Kidney transplant as cause of abnormal reaction or later complication   . Renal disorder   . Hypertension   . Glaucoma   . Colon polyps   . Gall stones 1980  . Hematoma   . Anti-phospholipid antibody syndrome     (Based on hospital discharge summary march 2013)  . ESRD (end stage renal disease)   . Atrial fibrillation   . Umbilical hernia 8882  . Carpal tunnel syndrome 2003    lt  . Esophageal stricture   . Hemorrhoids       Past Surgical History  Procedure Laterality Date  . Nephrectomy transplanted organ  2010  . Cholecystectomy  1980  . Umbilical hernia repair  1998    in for   Chief Complaint  Patient presents with  . Chest Pain     HPI  Debbie [name]  is a 63 y.o. 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 Came to the ER where EKG was unremarkable, I stat troponin was mildly elevated, was called to admit the patient for chest pain evaluation and for possible NSTEMI. Patient is  currently chest pain and symptom-free.    Review of Systems    In addition to the HPI above,  No Fever-chills, No Headache, No changes with Vision or hearing, No problems swallowing food or Liquids, As above Chest pain, Cough or Shortness of Breath, No Abdominal pain, No Nausea or Vommitting, Bowel movements are regular, No Blood in stool or Urine, No dysuria, No new skin rashes or bruises, No new joints pains-aches,  No new weakness, tingling, numbness in any extremity, No recent weight gain or loss, No polyuria, polydypsia or polyphagia, No significant Mental Stressors.  A full 10 point Review of Systems was done, except as stated above, all other Review of Systems were negative.   Social History History  Substance Use Topics  . Smoking status: Former Smoker -- 1.00 packs/day for 30 years    Quit date: 04/16/1977  . Smokeless tobacco: Never Used  . Alcohol Use: No      Family History Family History  Problem Relation Age of Onset  . Hypertension Mother   .  Heart disease Mother   . Colon cancer Neg Hx   . Rectal cancer Neg Hx   . Stomach cancer Neg Hx       Prior to Admission medications   Medication Sig Start Date End Date Taking? Authorizing Provider  ALPHAGAN P 0.1 % SOLN Place 1 drop into both eyes at bedtime.  10/11/11  Yes Historical Provider, MD  aspirin EC 81 MG tablet Take 81 mg by mouth daily.   Yes Historical Provider, MD  calcitRIOL (ROCALTROL) 0.25 MCG capsule Take 0.25-0.5 mcg by mouth daily. Take 0.25 mcg daily alternating with 0.5 mcg 06/12/13  Yes Historical Provider, MD  furosemide (LASIX) 20 MG tablet Take 20 mg by mouth daily as needed. Excess edema   Yes Historical Provider, MD  labetalol (NORMODYNE) 200 MG tablet Take 200 mg by mouth daily.    Yes Historical Provider, MD  LUMIGAN 0.01 % SOLN Place 1 drop into both eyes at bedtime.  10/30/11  Yes Historical Provider, MD  mycophenolate (MYFORTIC) 180 MG EC tablet Take 180 mg by mouth 2 (two) times  daily.    Yes Historical Provider, MD  ranitidine (ZANTAC) 150 MG tablet Take 150 mg by mouth daily.    Yes Historical Provider, MD  sodium bicarbonate 650 MG tablet Take 650 mg by mouth 2 (two) times daily.   Yes Historical Provider, MD  tacrolimus (PROGRAF) 1 MG capsule Take 3-4 mg by mouth See admin instructions. 4 caps in am and 3 caps in pm.   Yes Historical Provider, MD  timolol (BETIMOL) 0.5 % ophthalmic solution Place 1 drop into both eyes 2 (two) times daily.   Yes Historical Provider, MD  warfarin (COUMADIN) 2.5 MG tablet Take 2.5 mg by mouth every evening.    Yes Historical Provider, MD    Allergies  Allergen Reactions  . Gentamycin [Gentamicin Sulfate] Itching and Swelling  . Penicillins Itching  . Vancomycin Itching and Swelling    Physical Exam  Vitals  Blood pressure 173/73, pulse 65, temperature 98.3 F (36.8 C), temperature source Oral, resp. rate 19, SpO2 100.00%.   1. General middle-aged African American female lying in bed in NAD,     2. Normal affect and insight, Not Suicidal or Homicidal, Awake Alert, Oriented X 3.  3. No F.N deficits, ALL C.Nerves Intact, Strength 5/5 all 4 extremities, Sensation intact all 4 extremities, Plantars down going.  4. Ears and Eyes appear Normal, Conjunctivae clear, PERRLA. Moist Oral Mucosa.  5. Supple Neck, No JVD, No cervical lymphadenopathy appriciated, No Carotid Bruits.  6. Symmetrical Chest wall movement, Good air movement bilaterally, CTAB.  7. RRR, No Gallops, Rubs does have a positive systolic murmur in the mitral valve area, No Parasternal Heave.  8. Positive Bowel Sounds, Abdomen Soft, No tenderness, No organomegaly appriciated,No rebound -guarding or rigidity.  9.  No Cyanosis, Normal Skin Turgor, No Skin Rash or Bruise.  10. Good muscle tone,  joints appear normal , no effusions, Normal ROM.  11. No Palpable Lymph Nodes in Neck or Axillae     Data Review  CBC  Recent Labs Lab 06/30/13 1450  WBC 5.0   HGB 10.5*  HCT 32.1*  PLT 151  MCV 83.2  MCH 27.2  MCHC 32.7  RDW 14.0   ------------------------------------------------------------------------------------------------------------------  Chemistries   Recent Labs Lab 06/30/13 1450  NA 141  K 4.6  CL 106  CO2 21  GLUCOSE 97  BUN 29*  CREATININE 1.87*  CALCIUM 10.0   ------------------------------------------------------------------------------------------------------------------ CrCl is  unknown because both a height and weight (above a minimum accepted value) are required for this calculation. ------------------------------------------------------------------------------------------------------------------ No results found for this basename: TSH, T4TOTAL, FREET3, T3FREE, THYROIDAB,  in the last 72 hours   Coagulation profile  Recent Labs Lab 06/30/13 1325  INR 2.20*   ------------------------------------------------------------------------------------------------------------------- No results found for this basename: DDIMER,  in the last 72 hours -------------------------------------------------------------------------------------------------------------------  Cardiac Enzymes No results found for this basename: CK, CKMB, TROPONINI, MYOGLOBIN,  in the last 168 hours ------------------------------------------------------------------------------------------------------------------ No components found with this basename: POCBNP,    ---------------------------------------------------------------------------------------------------------------  Urinalysis    Component Value Date/Time   COLORURINE YELLOW 04/18/2011 0238   APPEARANCEUR CLEAR 04/18/2011 0238   LABSPEC 1.014 04/18/2011 0238   PHURINE 5.5 04/18/2011 0238   GLUCOSEU NEGATIVE 04/18/2011 0238   HGBUR NEGATIVE 04/18/2011 0238   BILIRUBINUR NEGATIVE 04/18/2011 0238   KETONESUR NEGATIVE 04/18/2011 0238   PROTEINUR NEGATIVE 04/18/2011 0238   UROBILINOGEN 0.2  04/18/2011 0238   NITRITE NEGATIVE 04/18/2011 0238   LEUKOCYTESUR NEGATIVE 04/18/2011 0238    ----------------------------------------------------------------------------------------------------------------  Imaging results:   Dg Chest 2 View  06/30/2013   CLINICAL DATA:  Left-sided chest pain.  EXAM: CHEST  2 VIEW  COMPARISON:  01/07/2013 and 04/17/2011  FINDINGS: Heart size is normal. There is unchanged chronic scarring in the left hemithorax with chronic prominence of the left main pulmonary artery. There is chronic blunting of the left costophrenic angle.  Right lung is clear. There is extensive coronary calcification visible on the lateral view. No acute osseous abnormality. No effusions.  IMPRESSION: No acute abnormalities.   Electronically Signed   By: Rozetta Nunnery M.D.   On: 06/30/2013 13:42    My personal review of EKG: Rhythm NSR, Rate  67 /min, no Acute ST changes    Assessment & Plan    1.Chest pain. Sounds atypical. Mildly elevated stat troponin could be false or could be NSTEMI  - will keep her for 23 hour observation, cycle troponins in the lab, aspirin 81 mg, continue home dose beta blocker she is already anticoagulated with Coumadin which will be continued, cardiology has been consulted to see the patient in recommend further testing.    2. 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 Creatinine appears to be at baseline, no acute issues monitor BMP. Continue Prograf, sodium bicarbonate and Lasix along with calcitriol.    3. Hypertension. We'll continue home medications which include beta blocker and low-dose Lasix.    4. Glaucoma. No acute issues eyedrops at home dose.    5. History of atrial fibrillation. Currently in sinus. Telemetry monitor, beta blocker to be continued, pharmacy to dose Coumadin.     DVT Prophylaxis Coumadin  AM Labs Ordered, also please review Full Orders  Family Communication: Admission, patients condition and plan of care  including tests being ordered have been discussed with the patient   who indicates understanding and agree with the plan and Code Status.  Code Status full  Likely DC to  home  Condition fair fair  Time spent in minutes :35    Thurnell Lose M.D on 06/30/2013 at 4:21 PM  Between 7am to 7pm - Pager - [phone]  After 7pm go to [email or web address] - password [email or web address]  And look for the night coverage person covering me after hours  [facility] Office  [phone]   **Disclaimer: This note may have been dictated with voice recognition software. Similar sounding words can inadvertently be transcribed and this note may contain transcription  errors which may not have been corrected upon publication of note.**

## 2013-06-30 NOTE — Progress Notes (Signed)
ANTICOAGULATION CONSULT NOTE - Initial Consult  Pharmacy Consult for Coumadin Indication: atrial fibrillation  Allergies  Allergen Reactions  . Gentamycin [Gentamicin Sulfate] Itching and Swelling  . Penicillins Itching  . Vancomycin Itching and Swelling    Vital Signs: Temp: 98.3 F (36.8 C) (06/09 1300) Temp src: Oral (06/09 1300) BP: 171/70 mmHg (06/09 1635) Pulse Rate: 68 (06/09 1635)  Labs:  Recent Labs  06/30/13 1325 06/30/13 1450  HGB  --  10.5*  HCT  --  32.1*  PLT  --  151  LABPROT 23.7*  --   INR 2.20*  --   CREATININE  --  1.87*    The CrCl is unknown because both a height and weight (above a minimum accepted value) are required for this calculation.   Medical History: Past Medical History  Diagnosis Date  . Kidney transplant as cause of abnormal reaction or later complication   . Renal disorder   . Hypertension   . Glaucoma   . Colon polyps   . Gall stones 1980  . Hematoma   . Anti-phospholipid antibody syndrome     (Based on hospital discharge summary march 2013)  . ESRD (end stage renal disease)   . Atrial fibrillation   . Umbilical hernia 0626  . Carpal tunnel syndrome 2003    lt  . Esophageal stricture   . Hemorrhoids    Assessment: 77 YOF with hx of afib on chronic coumadin, presented with CP, troponin mildly elevated = 0.12, EKG unremarkable, per MD note plan for 24 hr observation. Pharmacy is consulted to dose coumadin. Hgb 10.5, plt 151, INR 2.2. No bleeding per chart.  PTA dose: 2.5mg  daily, last dose was 6/8  Goal of Therapy:  INR 2-3 Monitor platelets by anticoagulation protocol: Yes   Plan:  - Coumadin 2.5mg  po x 1 - Daily PT/INR - f/u plans for cardiology workup.  Maryanna Shape, PharmD, BCPS  Clinical Pharmacist  Pager: [phone]   06/30/2013,4:39 PM

## 2013-06-30 NOTE — ED Notes (Signed)
Pt place on monitor; Ashura, RN present in room

## 2013-06-30 NOTE — ED Notes (Signed)
Patient transported to X-ray 

## 2013-06-30 NOTE — ED Notes (Signed)
Cardiology MD at bedside.

## 2013-06-30 NOTE — ED Notes (Signed)
Results of troponin given to Dr. Roderic Palau

## 2013-06-30 NOTE — ED Notes (Signed)
Pt arrived to [facility]

## 2013-06-30 NOTE — Consult Note (Signed)
Admit date: 06/30/2013 Referring Physician  Dr. Candiss Norse Primary Physician  Dr. Hassell Done Primary Cardiologist  Dr. 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  She also has a history of esophageal stricture.  She started having left sided chest pain that she describes as a pressure sensation over her left chest.  She is not sure if it goes into her arms because she says that she has chronic arm pain ever since being on hemodialysis.  She says that when she gets the pain she gets nauseated and then goes to the bathroom and has diarrhea.  The diarrhea has occurred every time she gets the chest pain and after she goes to the bathroom her pain resolves.  The pain is nonexertional.  She will also get mildly SOB and dizzy.  This has been going on for 2-3 weeks.  Over the past few days her symptoms have become more frequent and more severe lasting longer.  She has a remote history of tobacco use in 1979.  This am her pain was severe early in the am which prompted ER visit.  Currently she is pain free.     PMH:   Past Medical History  Diagnosis Date  . Kidney transplant as cause of abnormal reaction or later complication   . Renal disorder   . Hypertension   . Glaucoma   . Colon polyps   . Gall stones 1980  . Hematoma   . Anti-phospholipid antibody syndrome     (Based on hospital discharge summary march 2013)  . ESRD (end stage renal disease)   . Atrial fibrillation   . Umbilical hernia 4008  . Carpal tunnel syndrome 2003    lt  . Esophageal stricture   . Hemorrhoids      PSH:   Past Surgical History  Procedure Laterality Date  . Nephrectomy transplanted organ  2010  . Cholecystectomy  1980  . Umbilical hernia repair  1998    Allergies:  Gentamycin; Penicillins; and Vancomycin Prior to Admit Meds:   (Not in a hospital admission) Fam HX:    Family History  Problem Relation Age of Onset  . Hypertension Mother   . Heart disease Mother   . Colon cancer Neg Hx   . Rectal cancer Neg Hx   . Stomach cancer Neg Hx    Social HX:    History   Social History  . Marital Status: Married    Spouse Name: N/A    Number of Children: 3  . Years of Education: N/A   Occupational History  . disabled    Social History Main Topics  . Smoking status: Former Smoker -- 1.00 packs/day for 30 years    Quit date: 04/16/1977  . Smokeless tobacco: Never Used  . Alcohol Use: No  . Drug Use: No  . Sexual Activity: Not on file   Other Topics Concern  . Not on file   Social History Narrative  . No narrative on file     ROS:  All 11 ROS were addressed and are negative except what is stated in the HPI  Physical Exam: Blood pressure 171/70, pulse 68, temperature 98.3 F (36.8 C), temperature source Oral, resp. rate 17, SpO2 100.00%.    General: Well  developed, well nourished, in no acute distress Head: Eyes PERRLA, No xanthomas.   Normal cephalic and atramatic  Lungs:   Clear bilaterally to auscultation and percussion. Heart:   HRRR S1 S2 Pulses are 2+ & equal.            No carotid bruit. No JVD.  No abdominal bruits. No femoral bruits. Abdomen: Bowel sounds are positive, abdomen soft and non-tender without masses  Extremities:   No clubbing, cyanosis or edema.  DP +1 Neuro: Alert and oriented X 3. Psych:  Good affect, responds appropriately    Labs:   Lab Results  Component Value Date   WBC 5.0 06/30/2013   HGB 10.5* 06/30/2013   HCT 32.1* 06/30/2013   MCV 83.2 06/30/2013   PLT 151 06/30/2013    Recent Labs Lab 06/30/13 1450  NA 141  K 4.6  CL 106  CO2 21  BUN 29*  CREATININE 1.87*  CALCIUM 10.0  GLUCOSE 97   No results found for this basename: PTT   Lab Results  Component Value Date   INR 2.20*  06/30/2013   INR 1.53* 12/04/2012   INR 1.78* 11/01/2011   Lab Results  Component Value Date   CKTOTAL 81 04/18/2011   CKMB 2.3 04/18/2011   TROPONINI <0.30 04/18/2011     No results found for this basename: CHOL   No results found for this basename: HDL   No results found for this basename: LDLCALC   No results found for this basename: TRIG   No results found for this basename: CHOLHDL   No results found for this basename: LDLDIRECT      Radiology:  Dg Chest 2 View  06/30/2013   CLINICAL DATA:  Left-sided chest pain.  EXAM: CHEST  2 VIEW  COMPARISON:  01/07/2013 and 04/17/2011  FINDINGS: Heart size is normal. There is unchanged chronic scarring in the left hemithorax with chronic prominence of the left main pulmonary artery. There is chronic blunting of the left costophrenic angle.  Right lung is clear. There is extensive coronary calcification visible on the lateral view. No acute osseous abnormality. No effusions.  IMPRESSION: No acute abnormalities.   Electronically Signed   By: Rozetta Nunnery M.D.   On: 06/30/2013 13:42    EKG:  NSR with no ST changes  ASSESSMENT:  1.  Chest pain that has typical and atypical features.  It is left sided and pressure related but seems to trigger nausea and then diarrhea every time and after she goes to the bathroom it resolves.  Her initial poc troponin is elevated but this may be due to underlying CKD. 2.  PAF maintaining NSR 3. Chronic systemic anticoagulation 4.  ESRD s/p renal transplant now with CKD stage 4 - creatinine elevated to 1.87 which she says is the highest it has been 5.  HTN 6.  Antiphospholipid antibody syndrome 7.  Chronic anemia   PLAN:   1.  Continue to cycle enzymes to see how they trend 2.  Check 2D echo to assess LVF.   3.  Unless enzymes have a significant trend upward, would consider noninvasive testing with Lexiscan myoview in light of her renal transplant now with stage 4 CKD and worsening creatinine.  Her symptoms  are  atypical.   Would recommend cath only if scan high risk to avoid nephrotoxicity from contrast and potential worsening of renal function to the point of needing to be put back on HD 4.  Continue coumadin 5.  Continue ASA/beta  blocker 6.  Will follow with you  Sueanne Margarita, MD  06/30/2013  5:04 PM

## 2013-06-30 NOTE — ED Notes (Signed)
Unsuccessful stick. IV team paged.

## 2013-06-30 NOTE — ED Notes (Signed)
shes been having chest pain intermittent over the past few weeks. She thought it was indigestion but she took medicine for acid reflux with no relief. She states she is not having the pain now

## 2013-07-01 LAB — BASIC METABOLIC PANEL
BUN: 26 mg/dL — AB (ref 6–23)
CALCIUM: 9.7 mg/dL (ref 8.4–10.5)
CO2: 19 mEq/L (ref 19–32)
CREATININE: 1.88 mg/dL — AB (ref 0.50–1.10)
Chloride: 105 mEq/L (ref 96–112)
GFR, EST AFRICAN AMERICAN: 31 mL/min — AB (ref >90)
GFR, EST NON AFRICAN AMERICAN: 27 mL/min — AB (ref >90)
Glucose, Bld: 118 mg/dL — ABNORMAL HIGH (ref 70–99)
Potassium: 4.3 mEq/L (ref 3.7–5.3)
Sodium: 141 mEq/L (ref 137–147)

## 2013-07-01 LAB — TROPONIN I: Troponin I: <0.3 ng/mL (ref <0.30)

## 2013-07-01 LAB — PROTIME-INR
INR: 2.24 — ABNORMAL HIGH (ref 0.00–1.49)
PROTHROMBIN TIME: 24.1 s — AB (ref 11.6–15.2)

## 2013-07-01 MED ORDER — SODIUM CHLORIDE 0.9 % IJ SOLN: 3.0000 mL | INTRAMUSCULAR | Status: DC | PRN

## 2013-07-01 MED ORDER — SODIUM CHLORIDE 0.9 % IJ SOLN
3.0000 mL | Freq: Two times a day (BID) | INTRAMUSCULAR | Status: DC
  Administered 2013-07-01 – 2013-07-06 (×8): 3 mL via INTRAVENOUS

## 2013-07-01 MED ORDER — WARFARIN SODIUM 2.5 MG PO TABS
2.5000 mg | ORAL_TABLET | Freq: Once | ORAL | Status: AC
  Administered 2013-07-01: 2.5 mg via ORAL
  Filled 2013-07-01: qty 1

## 2013-07-01 NOTE — Progress Notes (Signed)
Agree symptoms are atypical and we need risk stratification with myocardial perfusion imaging. She denies current discomfort. Agree with the idea of avoiding contrast exposure unless moderate to high risk perfusion study. Will plan to perform in AM since she was not made NPO for testing today.

## 2013-07-01 NOTE — Progress Notes (Signed)
ANTICOAGULATION CONSULT NOTE - Follow-up Consult  Pharmacy Consult for Coumadin Indication: atrial fibrillation  Allergies  Allergen Reactions  . Gentamycin [Gentamicin Sulfate] Itching and Swelling  . Penicillins Itching  . Vancomycin Itching and Swelling    Vital Signs: Temp: 98.6 F (37 C) (06/10 0518) Temp src: Oral (06/10 0518) BP: 152/55 mmHg (06/10 0518) Pulse Rate: 69 (06/10 0518)  Labs:  Recent Labs  06/30/13 1325 06/30/13 1450 06/30/13 1601 06/30/13 2236 07/01/13 0424  HGB  --  10.5*  --   --   --   HCT  --  32.1*  --   --   --   PLT  --  151  --   --   --   LABPROT 23.7*  --   --   --  24.1*  INR 2.20*  --   --   --  2.24*  CREATININE  --  1.87*  --   --  1.88*  TROPONINI  --   --  <0.30 <0.30 <0.30    The CrCl is unknown because both a height and weight (above a minimum accepted value) are required for this calculation.  Assessment: 83 YOF with hx of afib on chronic coumadin, presented with CP, troponin mildly elevated = 0.12, EKG unremarkable, per MD note plan for 24 hr observation. She continues on coumadin and INR remains therapeutic on her home dose.   PTA dose: 2.5mg  daily, last dose was 6/8  Goal of Therapy:  INR 2-3   Plan:  1. Repeat coumadin 2.5mg  PO x 1 tonight 2. F/u AM INR  Salome Arnt, PharmD, BCPS Pager # [phone] 07/01/2013 8:54 AM

## 2013-07-01 NOTE — Progress Notes (Signed)
UR completed 

## 2013-07-01 NOTE — Discharge Instructions (Addendum)
Information on my medicine - Coumadin   (Warfarin)  This medication education was reviewed with me or my healthcare representative as part of my discharge preparation.    Why was Coumadin prescribed for you? Coumadin was prescribed for you because you have a blood clot or a medical condition that can cause an increased risk of forming blood clots. Blood clots can cause serious health problems by blocking the flow of blood to the heart, lung, or brain. Coumadin can prevent harmful blood clots from forming. As a reminder your indication for Coumadin is:   Stroke Prevention Because Of Atrial Fibrillation  What test will check on my response to Coumadin? While on Coumadin (warfarin) you will need to have an INR test regularly to ensure that your dose is keeping you in the desired range. The INR (international normalized ratio) number is calculated from the result of the laboratory test called prothrombin time (PT).  If an INR APPOINTMENT HAS NOT ALREADY BEEN MADE FOR YOU please schedule an appointment to have this lab work done by your health care provider within 7 days. Your INR goal is usually a number between:  2 to 3 or your provider may give you a more narrow range like 2-2.5.  Ask your health care provider during an office visit what your goal INR is.  What  do you need to  know  About  COUMADIN? Take Coumadin (warfarin) exactly as prescribed by your healthcare provider about the same time each day.  DO NOT stop taking without talking to the doctor who prescribed the medication.  Stopping without other blood clot prevention medication to take the place of Coumadin may increase your risk of developing a new clot or stroke.  Get refills before you run out.  What do you do if you miss a dose? If you miss a dose, take it as soon as you remember on the same day then continue your regularly scheduled regimen the next day.  Do not take two doses of Coumadin at the same time.  Important Safety  Information A possible side effect of Coumadin (Warfarin) is an increased risk of bleeding. You should call your healthcare provider right away if you experience any of the following:   Bleeding from an injury or your nose that does not stop.   Unusual colored urine (red or dark brown) or unusual colored stools (red or black).   Unusual bruising for unknown reasons.   A serious fall or if you hit your head (even if there is no bleeding).  Some foods or medicines interact with Coumadin (warfarin) and might alter your response to warfarin. To help avoid this:   Eat a balanced diet, maintaining a consistent amount of Vitamin K.   Notify your provider about major diet changes you plan to make.   Avoid alcohol or limit your intake to 1 drink for women and 2 drinks for men per day. (1 drink is 5 oz. wine, 12 oz. beer, or 1.5 oz. liquor.)  Make sure that ANY health care provider who prescribes medication for you knows that you are taking Coumadin (warfarin).  Also make sure the healthcare provider who is monitoring your Coumadin knows when you have started a new medication including herbals and non-prescription products.  Coumadin (Warfarin)  Major Drug Interactions  Increased Warfarin Effect Decreased Warfarin Effect  Alcohol (large quantities) Antibiotics (esp. Septra/Bactrim, Flagyl, Cipro) Amiodarone (Cordarone) Aspirin (ASA) Cimetidine (Tagamet) Megestrol (Megace) NSAIDs (ibuprofen, naproxen, etc.) Piroxicam (Feldene) Propafenone (Rythmol SR) Propranolol (  Inderal) Isoniazid (INH) Posaconazole (Noxafil) Barbiturates (Phenobarbital) Carbamazepine (Tegretol) Chlordiazepoxide (Librium) Cholestyramine (Questran) Griseofulvin Oral Contraceptives Rifampin Sucralfate (Carafate) Vitamin K   Coumadin (Warfarin) Major Herbal Interactions  Increased Warfarin Effect Decreased Warfarin Effect  Garlic Ginseng Ginkgo biloba Coenzyme Q10 Green tea St. Johns wort    Coumadin (Warfarin)  FOOD Interactions  Eat a consistent number of servings per week of foods HIGH in Vitamin K (1 serving =  cup)  Collards (cooked, or boiled & drained) Kale (cooked, or boiled & drained) Mustard greens (cooked, or boiled & drained) Parsley *serving size only =  cup Spinach (cooked, or boiled & drained) Swiss chard (cooked, or boiled & drained) Turnip greens (cooked, or boiled & drained)  Eat a consistent number of servings per week of foods MEDIUM-HIGH in Vitamin K (1 serving = 1 cup)  Asparagus (cooked, or boiled & drained) Broccoli (cooked, boiled & drained, or raw & chopped) Brussel sprouts (cooked, or boiled & drained) *serving size only =  cup Lettuce, raw (green leaf, endive, romaine) Spinach, raw Turnip greens, raw & chopped   These websites have more information on Coumadin (warfarin):  FailFactory.se; VeganReport.com.au;    Chest Pain (Nonspecific) Chest pain has many causes. Your pain could be caused by something serious, such as a heart attack or a blood clot in the lungs. It could also be caused by something less serious, such as a chest bruise or a virus. Follow up with your doctor. More lab tests or other studies may be needed to find the cause of your pain. Most of the time, nonspecific chest pain will improve within 2 to 3 days of rest and mild pain medicine. HOME CARE  For chest bruises, you may put ice on the sore area for 15-20 minutes, 03-04 times a day. Do this only if it makes you feel better.  Put ice in a plastic bag.  Place a towel between the skin and the bag.  Rest for the next 2 to 3 days.  Go back to work if the pain improves.  See your doctor if the pain lasts longer than 1 to 2 weeks.  Only take medicine as told by your doctor.  Quit smoking if you smoke. GET HELP RIGHT AWAY IF:   There is more pain or pain that spreads to the arm, neck, jaw, back, or belly (abdomen).  You have shortness of breath.  You cough more than  usual or cough up blood.  You have very bad back or belly pain, feel sick to your stomach (nauseous), or throw up (vomit).  You have very bad weakness.  You pass out (faint).  You have a fever. Any of these problems may be serious and may be an emergency. Do not wait to see if the problems will go away. Get medical help right away. Call your local emergency services 911 in [place] Do not drive yourself to the hospital. MAKE SURE YOU:   Understand these instructions.  Will watch this condition.  Will get help right away if you or your child is not doing well or gets worse. Document Released: 06/27/2007 Document Revised: 04/02/2011 Document Reviewed: 06/27/2007 [facility] Patient Information 2014 [place], [place].  Coronary Artery Disease, Risk Factors Research has shown that the risk of developing coronary artery disease (CAD) and having a heart attack increases with each factor you have. RISK FACTORS YOU CANNOT CHANGE  Your age. Your risk goes up as you get older. Most heart attacks happen to people over the age of  65.  Gender. Men have a greater risk of heart attack than women, and they have attacks earlier in life. However, women are more likely to die from a heart attack.  Heredity. Children of parents with heart disease are more likely to develop it themselves.  Race. African Americans and other ethnic groups have a higher risk, possibly because of high blood pressure, a tendency toward obesity, and diabetes.  Your family. Most people with a strong family history of heart disease have one or more other risk factors. RISK FACTORS YOU CAN CHANGE  Exposure to tobacco smoke. Even secondhand smoke greatly increases the risk for heart disease.  High blood cholesterol may be lowered with changes in diet, activity, and medicines.  High blood pressure makes the heart work harder. This causes the heart muscles to become thick and, eventually, weaker. It also increases your risk of stroke,  heart attack, and kidney or heart failure.  Physical inactivity is a risk factor for CAD. Regular physical activity helps prevent heart and blood vessel disease. Exercise helps control blood cholesterol, diabetes, obesity, and it may help lower blood pressure in some people.  Excess body fat, especially belly fat, increases the risk of heart disease and stroke even if there are no other risk factors. Excess weight increases the heart's workload and raises blood pressure and blood cholesterol.  Diabetes seriously increases your risk of developing CAD. If you have diabetes, you should work with your caregiver to manage it and control other risk factors. OTHER RISK FACTORS FOR CAD  How you respond to stress.  Drinking too much alcohol may raise blood pressure, cause heart failure, and lead to stroke.  Total cholesterol greater than 200 milligrams.  HDL (good) cholesterol less than 40 milligrams. HDL helps keep cholesterol from building up in the walls of the arteries. PREVENTING CAD  Maintain a healthy weight.  Exercise or do physical activity.  Eat a heart-healthy diet low in fat and salt and high in fiber.  Control your blood pressure to keep it below 120 over 80.  Keep your cholesterol at a level that lowers your risk.  Manage diabetes if you have it.  Stop smoking.  Learn how to manage stress. HEART SMART SUBSTITUTIONS  Instead of whole or 2% milk and cream, use skim milk.  Instead of fried foods, eat baked, steamed, boiled, broiled, or microwaved foods.  Instead of lard, butter, palm and coconut oils, cook with unsaturated vegetable oils, such as corn, olive, canola, safflower, sesame, soybean, sunflower, or peanut.  Instead of fatty cuts of meat, eat lean cuts of meat or cut off the fatty parts.  Instead of 1 whole egg in recipes, use 2 egg whites.  Instead of sauces, butter, and salt, season vegetables with herbs and spices.  Instead of regular hard and processed  cheeses, eat low-fat, low-sodium cheeses.  Instead of salted potato chips, choose low-fat, unsalted tortilla and potato chips and unsalted pretzels and popcorn.  Instead of sour cream and mayonnaise, use plain low-fat yogurt, low-fat cottage cheese, or low-fat or "light" sour cream. FOR MORE INFORMATION  National Heart Lung and Blood Institute: http://www.ball-ray.net/ American Heart Association: weddingcandid.com Document Released: 03/31/2003 Document Revised: 04/02/2011 Document Reviewed: 03/26/2007 [employer] Patient Information 2014 [place], [place].  Enoxaparin, Home Use Enoxaparin (Lovenox) injection is a medication used to prevent clots from developing in your veins. Medications such as enoxaparin are called blood thinners or anticoagulants. If blood clots are untreated they could travel to your lungs. This is called a  pulmonary embolus. A blood clot in your lungs can be fatal. Caregivers often use anticoagulants such as enoxaparin to prevent clots following surgery. It is also used along with aspirin when the heart is not getting enough blood. Continue the enoxaparin injections as directed by your caregiver. Your caregiver will use blood clotting test results to decide when you can safely stop using enoxaparin injections. If your caregiver prescribes any additional anticoagulant, you must take it exactly as directed. RISKS AND COMPLICATIONS  If you have received recent epidural anesthesia, spinal anesthesia, or a spinal tap while receiving anticoagulants, you are at risk for developing a blood clot in or around the spine. This condition could result in long-term or permanent paralysis.  Because anticoagulants thin your blood, severe bleeding may occur from any tissue or organ. Symptoms of the blood being too thin may include:  Bleeding from the nose or gums that does not stop quickly.  Unusual bruising or bruising easily.  Swelling or pain at an injection site.  A cut  that does not stop bleeding within 10 minutes.  Continual nausea for more than 1 day or vomiting blood.  Coughing up blood.  Blood in the urine which may appear as pink, red, or brown urine.  Blood in bowel movements which may appear as red, dark or black stools.  Sudden weakness or numbness of the face, arm, or leg, especially on one side of the body.  Sudden confusion.  Trouble speaking (aphasia) or understanding.  Sudden trouble seeing in one or both eyes.  Sudden trouble walking.  Dizziness.  Loss of balance or coordination.  Severe pain, such as a headache, joint pain, or back pain.  Fever.  Bruising around the injection sites may be expected.  Platelet drops, known as "thrombocytopenia," can occur with enoxaparin use. A condition called "heparin-induced thrombocytopenia" has been seen. If you have had this condition, you should tell your caregiver. Your caregiver may direct you to have blood tests to monitor this condition.  Do not use if you have allergies to the medication, heparin, or pork products.  Other side effects may include mild local reactions or irritation at the site of injection, pain, bruising, and redness of skin. HOME CARE INSTRUCTIONS You will be instructed by your caregiver how to give enoxaparin injections. 1. Before giving your medication you should make sure the injection is a clear and colorless or pale yellow solution. If your medication becomes discolored or has particles in the bottle, do not use and notify your caregiver. 2. When using the 30 and 40 mg pre-filled syringes, do not expel the air bubble from the syringe before the injection. This makes sure you use all the medication in the syringe. 3. The injections will be given subcutaneously. This means it is given into the fat over the belly (abdomen). It is given deep beneath the skin but not into the muscle. The shots should be injected around the abdominal wall. Change the sites of  injection each time. The whole length of the needle should be introduced into a skin fold held between the thumb and forefinger; the skin fold should be held throughout the injection. Do not rub the injection site after completion of the injection. This increases bruising. Enoxaparin injection pre-filled syringes and graduated pre-filled syringes are available with a system that shields the needle after injection. 4. Inject by pushing the plunger to the bottom of the syringe. 5. Remove the syringe from the injection site keeping your finger on the plunger rod.  Be careful not to stick yourself or others. 6. After injection and the syringe is empty, set off the safety system by firmly pushing the plunger rod. The protective sleeve will automatically cover the needle and you can hear a click. The click means your needle is safely covered. Do not try replacing the needle shield. 7. Get rid of the syringe in the nearest sharps container. 8. Keep your medication safely stored at room temperatures.  Due to the complications of anticoagulants, it is very important that you take your anticoagulant as directed by your caregiver. Anticoagulants need to be taken exactly as instructed. Be sure you understand all your anticoagulant instructions.  Changes in medicines, supplements, diet, and illness can affect your anticoagulation therapy. Be sure to inform your caregivers of any of these changes.  While on anticoagulants, you will need to have blood tests done routinely as directed by your caregivers.  Be careful not to cut yourself when using sharp objects.  Limit physical activities or sports that could result in a fall or cause injury.  It is extremely important that you tell all of your caregivers and dentist that you are taking an anticoagulant, especially if you are injured or plan to have any type of procedure or operation.  Follow up with your laboratory test and caregiver appointments as directed. It  is very important to keep your appointments. Not keeping appointments could result in a chronic or permanent injury, pain, or disability. SEEK MEDICAL CARE IF:  You develop any rashes.  You have any worsening of the condition for which you are receiving anticoagulation therapy. SEEK IMMEDIATE MEDICAL CARE IF:  Bleeding from the nose or gums does not stop quickly.  You have unusual bruising or are bruising easily.  Swelling or pain occurs at an injection site.  A cut does not stop bleeding within 10 minutes.  You have continual nausea for more than 1 day or are vomiting blood.  You are coughing up blood.  You have blood in the urine.  You have dark or black stools.  You have sudden weakness or numbness of the face, arm, or leg, especially on one side of the body.  You have sudden confusion.  You have trouble speaking (aphasia) or understanding.  You have sudden trouble seeing in one or both eyes.  You have sudden trouble walking.  You have dizziness.  You have a loss of balance or coordination.  You have severe pain, such as a headache, joint pain, or back pain.  You have a serious fall or head injury, even if you are not bleeding.  You have an oral temperature above 102 F (38.9 C), not controlled by medicine. ANY OF THESE SYMPTOMS MAY REPRESENT A SERIOUS PROBLEM THAT IS AN EMERGENCY. Do not wait to see if the symptoms will go away. Get medical help right away. Call your local emergency services (911 in [place] DO NOT drive yourself to the hospital. MAKE SURE YOU:  Understand these instructions.  Will watch your condition.  Will get help right away if you are not doing well or get worse. Document Released: 11/10/2003 Document Revised: 04/02/2011 Document Reviewed: 01/08/2005 [facility] Patient Information 2014 [place].

## 2013-07-01 NOTE — ED Provider Notes (Signed)
Medical screening examination/treatment/procedure(s) were performed by non-physician practitioner and as supervising physician I was immediately available for consultation/collaboration.   EKG Interpretation   Date/Time:  Tuesday June 30 2013 12:41:22 EDT Ventricular Rate:  67 PR Interval:  152 QRS Duration: 78 QT Interval:  404 QTC Calculation: 426 R Axis:   43 Text Interpretation:  Normal sinus rhythm Normal ECG Confirmed by Brayden Betters   MD, Ashelynn Marks ([id number]) on 06/30/2013 4:05:00 PM        Maudry Diego, MD 07/01/13 2051

## 2013-07-01 NOTE — Progress Notes (Signed)
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    Subjective: She is currently CP free but states she had another episode of resting, left-sided chest pressure last PM that lasted ~6 minutes. Non radiating and resolved spontaneously.   Objective: Vital signs in last 24 hours: Temp:  [97.7 F (36.5 C)-98.6 F (37 C)] 98.6 F (37 C) (06/10 0518) Pulse Rate:  [64-73] 69 (06/10 0518) Resp:  [13-20] 15 (06/10 0518) BP: (150-177)/(55-87) 152/55 mmHg (06/10 0518) SpO2:  [98 %-100 %] 99 % (06/10 0518)    Intake/Output from previous day:   Intake/Output this shift:    Medications Current Facility-Administered Medications  Medication Dose Route Frequency Provider Last Rate Last Dose  . aspirin EC tablet 81 mg  81 mg Oral Daily Thurnell Lose, MD      . brimonidine (ALPHAGAN) 0.2 % ophthalmic solution 1 drop  1 drop Both Eyes TID Thurnell Lose, MD   1 drop at 06/30/13 2200  . calcitRIOL (ROCALTROL) capsule 0.25 mcg  0.25 mcg Oral QODAY Thurnell Lose, MD   0.25 mcg at 06/30/13 2000  . calcitRIOL (ROCALTROL) capsule 0.5 mcg  0.5 mcg Oral QODAY Rocky Crafts Hammons, RPH      . famotidine (PEPCID) tablet 40 mg  40 mg Oral Daily Thurnell Lose, MD      . furosemide (LASIX) tablet 20 mg  20 mg Oral Daily Thurnell Lose, MD      . gi cocktail (Maalox,Lidocaine,Donnatal)  30 mL Oral QID PRN Thurnell Lose, MD      . labetalol (NORMODYNE) tablet 200 mg  200 mg Oral Daily Thurnell Lose, MD      . latanoprost (XALATAN) 0.005 % ophthalmic solution 1 drop  1 drop Both Eyes QHS Thurnell Lose, MD      . mycophenolate (MYFORTIC) EC tablet 180 mg  180 mg Oral BID Thurnell Lose, MD   180 mg at 06/30/13 2200  . sodium bicarbonate tablet 650 mg  650  mg Oral BID Thurnell Lose, MD   650 mg at 06/30/13 2200  . tacrolimus (PROGRAF) capsule 4 mg  4 mg Oral Daily Kimberly Ballard Hammons, RPH       And  . tacrolimus (PROGRAF) capsule 3 mg  3 mg Oral q1800 Rocky Crafts Hammons, RPH   3 mg at 06/30/13 2000  . timolol (TIMOPTIC) 0.5 % ophthalmic solution 1 drop  1 drop Both Eyes BID Thurnell Lose, MD      . warfarin (COUMADIN) tablet 2.5 mg  2.5 mg Oral ONCE-1800 Rande Lawman Rumbarger, [facility]      . Warfarin - Pharmacist Dosing Inpatient   Does not apply q1800 Manley Mason, [facility]        PE: General appearance: alert, cooperative and no distress Lungs: clear to auscultation bilaterally Heart: regular rate and rhythm and 2/6 SM, best heard at LUSB Extremities: no LEE Pulses: 2+ and symmetric Skin: warm and dry Neurologic: Grossly normal  Lab Results:   Recent Labs  06/30/13 1450  WBC 5.0  HGB 10.5*  HCT 32.1*  PLT 151   BMET  Recent Labs  06/30/13 1450 07/01/13 0424  NA 141 141  K 4.6 4.3  CL 106 105  CO2  21 19  GLUCOSE 97 118*  BUN 29* 26*  CREATININE 1.87* 1.88*  CALCIUM 10.0 9.7   PT/INR  Recent Labs  06/30/13 1325 07/01/13 0424  LABPROT 23.7* 24.1*  INR 2.20* 2.24*   Cardiac Panel (last 3 results)  Recent Labs  06/30/13 1601 06/30/13 2236 07/01/13 0424  TROPONINI <0.30 <0.30 <0.30     Assessment/Plan  Active Problems:   Kidney transplant as cause of abnormal reaction or later complication   CKD (chronic kidney disease), stage III   Chest pain   Atrial fibrillation   HTN (hypertension)  1. Chest Pain: Mixed typical and atypical features. POC troponin was elevated (in the setting of CKD). Actual lab troponins were negative x 3. She is currently CP free, but had a recurrent episode of resting left sided chest pressure last PM, lasting ~6 min before resolving spontaneously. Unfortunately, she ate breakfast this AM. We will obtain a 2D echo today to assess LV function. We can plan for a possible  NST tomorrow.   2. CKD: Scr is 1.88 today.   3. PAF: currently in NSR. HR in the 70s. Continue warfarin for stroke prophylaxis.   4. Chronic Anticoagulation: Warfarin for atrial fibrillation. INR is therapeutic at 2.24. Goal is 2-3.   5. HTN: Moderately elevated, in the low 732K systolic. Continue labetalol and lasix.     LOS: 1 day    Niza Soderholm M. Rosita Fire, PA-C 07/01/2013 9:13 AM

## 2013-07-01 NOTE — Progress Notes (Signed)
[facility] PROGRESS NOTE  Debbie [name] Debbie [name] DOB: 1950/08/27 DOA: 06/30/2013 PCP: Debbie Paradise, MD  Assessment/Plan: 1.Chest pain. Sounds atypical. Mildly elevated stat troponin could be false or could be NSTEMI - Follow up serial cardiac enzymes neg - Cardiology following - For stress test tomorrow AM, NPO after midnight tonight 2. 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 Creatinine appears to be at baseline, no acute issues monitor BMP. Continue Prograf, sodium bicarbonate and Lasix along with calcitriol.  3. Hypertension. We'll continue home medications which include beta blocker and low-dose Lasix.  4. Glaucoma. No acute issues eyedrops at home dose.  5. History of atrial fibrillation. Currently in sinus. Cont beta blocker and coumadin  Code Status: Full Family Communication: Pt in room (indicate person spoken with, relationship, and if by phone, the number) Disposition Plan: Pending   Consultants:  Cardiology  Procedures:    Antibiotics:   (indicate start date, and stop date if known)  HPI/Subjective: Reports cp overnight associated w/ mild nausea. No further episodes this AM  Objective: Filed Vitals:   06/30/13 1635 06/30/13 1710 06/30/13 1743 07/01/13 0518  BP: 171/70 175/73 176/75 152/55  Pulse: 68 73 67 69  Temp:   97.7 F (36.5 C) 98.6 F (37 C)  TempSrc:   Oral Oral  Resp: 17 19 18 15   SpO2: 100% 100% 98% 99%   No intake or output data in the 24 hours ending 07/01/13 1234 There were no vitals filed for this visit.  Exam:   General:  Awake, in nad  Cardiovascular: regular,s 1, s2  Respiratory: normal resp effort, no wheezing  Abdomen: soft,nondistended  Musculoskeletal: perfused, no clubbing   Data Reviewed: Basic Metabolic Panel:  Recent Labs Lab 06/30/13 1450 07/01/13 0424  NA 141 141  K 4.6 4.3  CL 106 105  CO2 21 19  GLUCOSE 97 118*  BUN 29* 26*  CREATININE 1.87* 1.88*  CALCIUM 10.0 9.7   Liver  Function Tests: No results found for this basename: AST, ALT, ALKPHOS, BILITOT, PROT, ALBUMIN,  in the last 168 hours No results found for this basename: LIPASE, AMYLASE,  in the last 168 hours No results found for this basename: AMMONIA,  in the last 168 hours CBC:  Recent Labs Lab 06/30/13 1450  WBC 5.0  HGB 10.5*  HCT 32.1*  MCV 83.2  PLT 151   Cardiac Enzymes:  Recent Labs Lab 06/30/13 1601 06/30/13 2236 07/01/13 0424  TROPONINI <0.30 <0.30 <0.30   BNP (last 3 results) No results found for this basename: PROBNP,  in the last 8760 hours CBG: No results found for this basename: GLUCAP,  in the last 168 hours  No results found for this or any previous visit (from the past 240 hour(s)).   Studies: Dg Chest 2 View  06/30/2013   CLINICAL DATA:  Left-sided chest pain.  EXAM: CHEST  2 VIEW  COMPARISON:  01/07/2013 and 04/17/2011  FINDINGS: Heart size is normal. There is unchanged chronic scarring in the left hemithorax with chronic prominence of the left main pulmonary artery. There is chronic blunting of the left costophrenic angle.  Right lung is clear. There is extensive coronary calcification visible on the lateral view. No acute osseous abnormality. No effusions.  IMPRESSION: No acute abnormalities.   Electronically Signed   By: Debbie [name] M.D.   On: 06/30/2013 13:42    Scheduled Meds: . aspirin EC  81 mg Oral Daily  . brimonidine  1 drop Both Eyes TID  .  calcitRIOL  0.25 mcg Oral QODAY  . calcitRIOL  0.5 mcg Oral QODAY  . famotidine  40 mg Oral Daily  . furosemide  20 mg Oral Daily  . labetalol  200 mg Oral Daily  . latanoprost  1 drop Both Eyes QHS  . mycophenolate  180 mg Oral BID  . sodium bicarbonate  650 mg Oral BID  . sodium chloride  3 mL Intravenous Q12H  . tacrolimus  4 mg Oral Daily   And  . tacrolimus  3 mg Oral q1800  . timolol  1 drop Both Eyes BID  . warfarin  2.5 mg Oral ONCE-1800  . Warfarin - Pharmacist Dosing Inpatient   Does not apply q1800    Continuous Infusions:   Active Problems:   Kidney transplant as cause of abnormal reaction or later complication   CKD (chronic kidney disease), stage III   Chest pain   Atrial fibrillation   HTN (hypertension)  Time spent: 72min  Debbie [name], [facility] Pager [phone]. If 7PM-7AM, please contact night-coverage at [email or web address], password [facility] 07/01/2013, 12:34 PM  LOS: 1 day

## 2013-07-02 ENCOUNTER — Observation Stay (HOSPITAL_COMMUNITY): Payer: Medicare Other

## 2013-07-02 ENCOUNTER — Encounter (HOSPITAL_COMMUNITY): Payer: Self-pay | Admitting: General Practice

## 2013-07-02 DIAGNOSIS — R079 Chest pain, unspecified: Secondary | ICD-10-CM

## 2013-07-02 LAB — BASIC METABOLIC PANEL
BUN: 29 mg/dL — ABNORMAL HIGH (ref 6–23)
CO2: 21 mEq/L (ref 19–32)
CREATININE: 1.98 mg/dL — AB (ref 0.50–1.10)
Calcium: 10 mg/dL (ref 8.4–10.5)
Chloride: 100 mEq/L (ref 96–112)
GFR, EST AFRICAN AMERICAN: 30 mL/min — AB (ref >90)
GFR, EST NON AFRICAN AMERICAN: 26 mL/min — AB (ref >90)
Glucose, Bld: 93 mg/dL (ref 70–99)
Potassium: 4.5 mEq/L (ref 3.7–5.3)
Sodium: 138 mEq/L (ref 137–147)

## 2013-07-02 LAB — LIPID PANEL
Cholesterol: 181 mg/dL (ref 0–200)
HDL: 42 mg/dL (ref >39)
LDL CALC: 108 mg/dL — AB (ref 0–99)
Total CHOL/HDL Ratio: 4.3 RATIO
Triglycerides: 155 mg/dL — ABNORMAL HIGH (ref <150)
VLDL: 31 mg/dL (ref 0–40)

## 2013-07-02 LAB — PROTIME-INR
INR: 2.12 — ABNORMAL HIGH (ref 0.00–1.49)
PROTHROMBIN TIME: 23.1 s — AB (ref 11.6–15.2)

## 2013-07-02 MED ORDER — TECHNETIUM TC 99M SESTAMIBI GENERIC - CARDIOLITE
10.0000 | Freq: Once | INTRAVENOUS | Status: AC | PRN
  Administered 2013-07-02: 10 via INTRAVENOUS

## 2013-07-02 MED ORDER — TECHNETIUM TC 99M SESTAMIBI GENERIC - CARDIOLITE
30.0000 | Freq: Once | INTRAVENOUS | Status: AC | PRN
  Administered 2013-07-02: 30 via INTRAVENOUS

## 2013-07-02 MED ORDER — REGADENOSON 0.4 MG/5ML IV SOLN
INTRAVENOUS | Status: AC
  Filled 2013-07-02: qty 5

## 2013-07-02 MED ORDER — WARFARIN SODIUM 2.5 MG PO TABS
2.5000 mg | ORAL_TABLET | Freq: Once | ORAL | Status: AC
  Administered 2013-07-02: 2.5 mg via ORAL
  Filled 2013-07-02: qty 1

## 2013-07-02 MED ORDER — SODIUM CHLORIDE 0.9 % IV SOLN
INTRAVENOUS | Status: DC
  Administered 2013-07-02: 22:00:00 via INTRAVENOUS

## 2013-07-02 MED ORDER — REGADENOSON 0.4 MG/5ML IV SOLN
0.4000 mg | Freq: Once | INTRAVENOUS | Status: AC
  Administered 2013-07-02: 0.4 mg via INTRAVENOUS
  Filled 2013-07-02: qty 5

## 2013-07-02 NOTE — Progress Notes (Signed)
[facility] PROGRESS NOTE  Debbie [name] ZES:[id number] DOB: 13-May-1950 DOA: 06/30/2013 PCP: Geoffry Paradise, MD  Assessment/Plan: 1.Chest pain. Sounds atypical. Mildly elevated stat troponin could be false or could be NSTEMI - Follow up serial cardiac enzymes neg - Cardiology following - Abnormal stress test 6/11 - Will start pt on 50cc/hr saline and would notify Nephrology in AM 2. 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 Creatinine appears to be at baseline, no acute issues monitor BMP. Continue Prograf, sodium bicarbonate and Lasix along with calcitriol.  - Per above, in anticipation of ? Cardiac intervention, would notify Nephrology in AM 3. Hypertension. We'll continue home medications which include beta blocker and low-dose Lasix.  4. Glaucoma. No acute issues eyedrops at home dose.  5. History of atrial fibrillation. Currently in sinus. Cont beta blocker and coumadin  Code Status: Full Family Communication: Pt in room, family at bedside Disposition Plan: Pending   Consultants:  Cardiology  Procedures:    Antibiotics:   (indicate start date, and stop date if known)  HPI/Subjective: No cp currently , no acute events overnight  Objective: Filed Vitals:   07/02/13 1315 07/02/13 1317 07/02/13 1533 07/02/13 1545  BP: 165/82 162/84 151/62 126/71  Pulse:   73 74  Temp:   97.8 F (36.6 C)   TempSrc:   Oral   Resp:   16   SpO2:   100%    No intake or output data in the 24 hours ending 07/02/13 1816 There were no vitals filed for this visit.  Exam:   General:  Awake, in nad  Cardiovascular: regular,s 1, s2  Respiratory: normal resp effort, no wheezing  Abdomen: soft,nondistended  Musculoskeletal: perfused, no clubbing   Data Reviewed: Basic Metabolic Panel:  Recent Labs Lab 06/30/13 1450 07/01/13 0424 07/02/13 0509  NA 141 141 138  K 4.6 4.3 4.5  CL 106 105 100  CO2 21 19 21   GLUCOSE 97 118* 93  BUN 29* 26* 29*  CREATININE  1.87* 1.88* 1.98*  CALCIUM 10.0 9.7 10.0   Liver Function Tests: No results found for this basename: AST, ALT, ALKPHOS, BILITOT, PROT, ALBUMIN,  in the last 168 hours No results found for this basename: LIPASE, AMYLASE,  in the last 168 hours No results found for this basename: AMMONIA,  in the last 168 hours CBC:  Recent Labs Lab 06/30/13 1450  WBC 5.0  HGB 10.5*  HCT 32.1*  MCV 83.2  PLT 151   Cardiac Enzymes:  Recent Labs Lab 06/30/13 1601 06/30/13 2236 07/01/13 0424  TROPONINI <0.30 <0.30 <0.30   BNP (last 3 results) No results found for this basename: PROBNP,  in the last 8760 hours CBG: No results found for this basename: GLUCAP,  in the last 168 hours  No results found for this or any previous visit (from the past 240 hour(s)).   Studies: Nm Myocar Multi W/spect W/wall Motion / Ef  07/02/2013   CLINICAL DATA:  Patient is a 63 yo with history of chest pain. Test to evaluate/rule out ischemia.  EXAM: MYOCARDIAL IMAGING WITH SPECT (REST AND PHARMACOLOGIC-STRESS)  GATED LEFT VENTRICULAR WALL MOTION STUDY  LEFT VENTRICULAR EJECTION FRACTION  TECHNIQUE: Standard myocardial SPECT imaging was performed after resting intravenous injection of 10 mCi Tc-86m sestamibi. Subsequently, intravenous infusion of Lexiscan was performed under the supervision of the Cardiology staff. At peak effect of the drug, 30 mCi Tc-16m sestamibi was injected intravenously and standard myocardial SPECT imaging was performed. Quantitative gated imaging was also  performed to evaluate left ventricular wall motion, and estimate left ventricular ejection fraction.  COMPARISON:  None.  FINDINGS: Stress data: Baseline EKG: SR 67 bpm Nonspecific ST T wave changes. With infusion of lexiscan there was ST depression (0.5 to 1 mm) in the inferior and lateral leads. Incomplete normalization in recovery.  Nuclear data: In the initial stress images there was decreased tracer activity with a large defect in the inferior  wall (base, mid, distal) inferolateral wall (base, mid, distal) distal anterior and apical wall.  In the recovery images there was incomplete normalization inf the distal anteiro and apex.  On gating, LVEF was calculated at 54% with normal wall motion. Review of the raw data shows soft tissue (diaphragm) underlying the heart.  IMPRESSION: Lexiscan myoview: Electrically positive. Myoview scan with evidence of subendocardial scar and possible soft tissue attenuation in the inferior/inferolateral distal anterior and apical walls. with mild distal anterior/apical ischemia. LVEF 54%   Electronically Signed   By: Dorris Carnes M.D.   On: 07/02/2013 16:44    Scheduled Meds: . aspirin EC  81 mg Oral Daily  . brimonidine  1 drop Both Eyes TID  . calcitRIOL  0.25 mcg Oral QODAY  . calcitRIOL  0.5 mcg Oral QODAY  . famotidine  40 mg Oral Daily  . furosemide  20 mg Oral Daily  . labetalol  200 mg Oral Daily  . latanoprost  1 drop Both Eyes QHS  . mycophenolate  180 mg Oral BID  . regadenoson      . sodium bicarbonate  650 mg Oral BID  . sodium chloride  3 mL Intravenous Q12H  . tacrolimus  4 mg Oral Daily   And  . tacrolimus  3 mg Oral q1800  . timolol  1 drop Both Eyes BID  . Warfarin - Pharmacist Dosing Inpatient   Does not apply q1800   Continuous Infusions: . sodium chloride      Principal Problem:   Chest pain Active Problems:   Kidney transplant as cause of abnormal reaction or later complication   CKD (chronic kidney disease), stage III   Atrial fibrillation   HTN (hypertension)  Time spent: 70min  Debbie, [name] Pager [phone]. If 7PM-7AM, please contact night-coverage at [email or web address], password [facility] 07/02/2013, 6:16 PM  LOS: 2 days

## 2013-07-02 NOTE — Progress Notes (Signed)
Patient Name: Debbie [name] Date of Encounter: 07/02/2013  Principal Problem:   Chest pain Active Problems:   Kidney transplant as cause of abnormal reaction or later complication   CKD (chronic kidney disease), stage III   Atrial fibrillation   HTN (hypertension)    Patient Profile: 63 yo female w/ hx renal tx, PAF, chronic coumadin, CKD stage IV, anti-phospholipid antibody syndrome, HTN but no CAD was admitted 06/09 for chest pain. MV ordered for today.  SUBJECTIVE: No more chest pain, feels like she needs Lasix today, says legs are puffy.  OBJECTIVE Filed Vitals:   07/01/13 0518 07/01/13 1430 07/01/13 2100 07/02/13 0515  BP: 152/55 174/66 147/56 166/62  Pulse: 69 71 69 71  Temp: 98.6 F (37 C) 97.3 F (36.3 C) 98.7 F (37.1 C) 97.9 F (36.6 C)  TempSrc: Oral Oral Oral Oral  Resp: 15 18 18 18   SpO2: 99% 98% 99% 96%    Intake/Output Summary (Last 24 hours) at 07/02/13 1033 Last data filed at 07/01/13 1526  Gross per 24 hour  Intake    240 ml  Output      0 ml  Net    240 ml   There were no vitals filed for this visit.  PHYSICAL EXAM General: Well developed, well nourished, female in no acute distress. Head: Normocephalic, atraumatic.  Neck: Supple without bruits, JVD not elevated. Lungs:  Resp regular and unlabored, CTA. Heart: RRR, S1, S2, no S3, S4, soft murmur; no rub. Abdomen: Soft, non-tender, non-distended, BS + x 4.  Extremities: No clubbing, cyanosis, trace edema.  Neuro: Alert and oriented X 3. Moves all extremities spontaneously. Psych: Normal affect.  LABS: CBC:  Recent Labs  06/30/13 1450  WBC 5.0  HGB 10.5*  HCT 32.1*  MCV 83.2  PLT 151   INR:  Recent Labs  07/02/13 0509  INR 5.46*   Basic Metabolic Panel:  Recent Labs  07/01/13 0424 07/02/13 0509  NA 141 138  K 4.3 4.5  CL 105 100  CO2 19 21  GLUCOSE 118* 93  BUN 26* 29*  CREATININE 1.88* 1.98*  CALCIUM 9.7 10.0   Cardiac Enzymes:  Recent Labs   06/30/13 1601 06/30/13 2236 07/01/13 0424  TROPONINI <0.30 <0.30 <0.30    Recent Labs  06/30/13 1500  TROPIPOC 0.12*   BNP: Pro B Natriuretic peptide (BNP)  Date/Time Value Ref Range Status  04/17/2011  9:34 PM 3723.0* 0 - 125 pg/mL Final  12/23/2008 12:40 PM 1206.0* 0.0 - 100.0 pg/mL Final   No results found for this basename: CHOL, HDL, LDLCALC, LDLDIRECT, TRIG, CHOLHDL    TELE:         Radiology/Studies: Dg Chest 2 View 06/30/2013   CLINICAL DATA:  Left-sided chest pain.  EXAM: CHEST  2 VIEW  COMPARISON:  01/07/2013 and 04/17/2011  FINDINGS: Heart size is normal. There is unchanged chronic scarring in the left hemithorax with chronic prominence of the left main pulmonary artery. There is chronic blunting of the left costophrenic angle.  Right lung is clear. There is extensive coronary calcification visible on the lateral view. No acute osseous abnormality. No effusions.  IMPRESSION: No acute abnormalities.   Electronically Signed   By: Rozetta Nunnery M.D.   On: 06/30/2013 13:42    Current Medications:  . aspirin EC  81 mg Oral Daily  . brimonidine  1 drop Both Eyes TID  . calcitRIOL  0.25 mcg Oral QODAY  . calcitRIOL  0.5 mcg Oral  QODAY  . famotidine  40 mg Oral Daily  . furosemide  20 mg Oral Daily  . labetalol  200 mg Oral Daily  . latanoprost  1 drop Both Eyes QHS  . mycophenolate  180 mg Oral BID  . sodium bicarbonate  650 mg Oral BID  . sodium chloride  3 mL Intravenous Q12H  . tacrolimus  4 mg Oral Daily   And  . tacrolimus  3 mg Oral q1800  . timolol  1 drop Both Eyes BID  . warfarin  2.5 mg Oral ONCE-1800  . Warfarin - Pharmacist Dosing Inpatient   Does not apply q1800      ASSESSMENT AND PLAN: Principal Problem:   Chest pain - ez neg MI, For MV today. Cath only if high-risk. On ASA, BB, no statin, will ck lipids    Atrial fibrillation - SR on telemetry    Chronic anticoag - INR therapeutic  Otherwise, per IM Active Problems:   Kidney transplant as  cause of abnormal reaction or later complication   CKD (chronic kidney disease), stage III   HTN (hypertension)  Signed, Rosaria Ferries , PA-C 10:33 AM 07/02/2013

## 2013-07-02 NOTE — Progress Notes (Signed)
Called pt to discuss stress test results, listed below.   Advised her that the test was abnormal and we might want to do a heart cath. Advised her we would hydrate her gently overnight and call the renal team in am to make sure a heart cath (minimal dye) is OK with them.   Explained that an abnormal stress test meant she might have heart disease and need a stent, but we could not tell that unless we cathed her.   Pt was very reluctant to have a heart cath. She wants Dr. Detterding involved, but also says she does not want dye.   She feels she needs to go home and pray about it, then come back or go to [facility] if she decides to have the procedure.  Advised her I would contact the IM MD and let him know her preference (done).  If she is discharged today, consider a statin if not contraindicated with her anti-rejection medications in addition to her ASA and BB.  Nm Myocar Multi W/spect W/wall Motion / Ef 07/02/2013   CLINICAL DATA:  Patient is a 63 yo with history of chest pain. Test to evaluate/rule out ischemia.  EXAM: MYOCARDIAL IMAGING WITH SPECT (REST AND PHARMACOLOGIC-STRESS)  GATED LEFT VENTRICULAR WALL MOTION STUDY  LEFT VENTRICULAR EJECTION FRACTION  TECHNIQUE: Standard myocardial SPECT imaging was performed after resting intravenous injection of 10 mCi Tc-45m sestamibi. Subsequently, intravenous infusion of Lexiscan was performed under the supervision of the Cardiology staff. At peak effect of the drug, 30 mCi Tc-69m sestamibi was injected intravenously and standard myocardial SPECT imaging was performed. Quantitative gated imaging was also performed to evaluate left ventricular wall motion, and estimate left ventricular ejection fraction.  COMPARISON:  None.  FINDINGS: Stress data: Baseline EKG: SR 67 bpm Nonspecific ST T wave changes. With infusion of lexiscan there was ST depression (0.5 to 1 mm) in the inferior and lateral leads. Incomplete normalization in recovery.  Nuclear data: In the  initial stress images there was decreased tracer activity with a large defect in the inferior wall (base, mid, distal) inferolateral wall (base, mid, distal) distal anterior and apical wall.  In the recovery images there was incomplete normalization inf the distal anteiro and apex.  On gating, LVEF was calculated at 54% with normal wall motion. Review of the raw data shows soft tissue (diaphragm) underlying the heart.  IMPRESSION: Lexiscan myoview: Electrically positive. Myoview scan with evidence of subendocardial scar and possible soft tissue attenuation in the inferior/inferolateral distal anterior and apical walls. with mild distal anterior/apical ischemia. LVEF 54%   Electronically Signed   By: Dorris Carnes M.D.   On: 07/02/2013 16:44   Lab Results  Component Value Date   CHOL 181 07/02/2013   HDL 42 07/02/2013   LDLCALC 108* 07/02/2013   TRIG 155* 07/02/2013   CHOLHDL 4.3 07/02/2013

## 2013-07-02 NOTE — Progress Notes (Signed)
ANTICOAGULATION CONSULT NOTE - Follow-up Consult  Pharmacy Consult for Coumadin Indication: atrial fibrillation  Allergies  Allergen Reactions  . Gentamycin [Gentamicin Sulfate] Itching and Swelling  . Penicillins Itching  . Vancomycin Itching and Swelling    Vital Signs: Temp: 97.9 F (36.6 C) (06/11 0515) Temp src: Oral (06/11 0515) BP: 166/62 mmHg (06/11 0515) Pulse Rate: 71 (06/11 0515)  Labs:  Recent Labs  06/30/13 1325 06/30/13 1450 06/30/13 1601 06/30/13 2236 07/01/13 0424 07/02/13 0509  HGB  --  10.5*  --   --   --   --   HCT  --  32.1*  --   --   --   --   PLT  --  151  --   --   --   --   LABPROT 23.7*  --   --   --  24.1* 23.1*  INR 2.20*  --   --   --  2.24* 2.12*  CREATININE  --  1.87*  --   --  1.88* 1.98*  TROPONINI  --   --  <0.30 <0.30 <0.30  --     The CrCl is unknown because both a height and weight (above a minimum accepted value) are required for this calculation.  Assessment: 52 YOF with hx of afib on chronic coumadin admitted 06/30/2013  with CP, troponin positive. Pharmacy consulted to continue coumadin.  PMH: kidney transplant, HTN, glaucoma, anti-phospholipid antibody syndrome, CKD, afib, esophageal stricture  AC: afib, history antiphospholipid syndrome - INR at goal,  CBC slightly low on admission, no bleeding noted PTA: 2.5 mg daily  ID: Afebrile, WNC WNL, no abx  CV: HTN, afib -  BP 160s, HR 70s, troponin mildly elevated upon admit -  asa, lasix, labetalol - trying to avoid cath d/t renal problems  Endo: No hx - AM gluc ok  GI/Nutr: Hx esophageal stricture - famotidine  Renal: Hx kidney transplant, ESRD - CrCl est 25-30 ml/min, lytes ok - tac + myfortic as at home  Best practices: warfarin  Goal of Therapy:  INR 2-3   Plan:  1. Continue warfarin 2.5 mg daily 2. F/u AM INR  Thank you for allowing pharmacy to be a part of this patients care team.  Rowe Robert Pharm.D., BCPS, AQ-Cardiology Clinical Pharmacist 07/02/2013  10:17 AM Pager: [phone] Phone: [phone]

## 2013-07-02 NOTE — Progress Notes (Signed)
Awaiting nuclear testing and if no ischemia and low risk, will not pursue further w/u or change in management.

## 2013-07-03 LAB — PROTIME-INR
INR: 2.24 — AB (ref 0.00–1.49)
PROTHROMBIN TIME: 24.1 s — AB (ref 11.6–15.2)

## 2013-07-03 LAB — BASIC METABOLIC PANEL
BUN: 28 mg/dL — ABNORMAL HIGH (ref 6–23)
CO2: 21 mEq/L (ref 19–32)
Calcium: 9.8 mg/dL (ref 8.4–10.5)
Chloride: 101 mEq/L (ref 96–112)
Creatinine, Ser: 1.92 mg/dL — ABNORMAL HIGH (ref 0.50–1.10)
GFR calc non Af Amer: 26 mL/min — ABNORMAL LOW (ref >90)
GFR, EST AFRICAN AMERICAN: 31 mL/min — AB (ref >90)
Glucose, Bld: 94 mg/dL (ref 70–99)
Potassium: 4.6 mEq/L (ref 3.7–5.3)
Sodium: 138 mEq/L (ref 137–147)

## 2013-07-03 MED ORDER — DIPHENHYDRAMINE HCL 25 MG PO CAPS
25.0000 mg | ORAL_CAPSULE | Freq: Three times a day (TID) | ORAL | Status: DC | PRN
  Administered 2013-07-03 – 2013-07-06 (×2): 25 mg via ORAL
  Filled 2013-07-03 (×2): qty 1

## 2013-07-03 MED ORDER — ISOSORBIDE MONONITRATE ER 60 MG PO TB24
60.0000 mg | ORAL_TABLET | Freq: Every day | ORAL | Status: DC
  Administered 2013-07-03 – 2013-07-04 (×2): 60 mg via ORAL
  Filled 2013-07-03 (×2): qty 1

## 2013-07-03 NOTE — Progress Notes (Signed)
Patient Name: Debbie [name] Date of Encounter: 07/03/2013  Principal Problem:   Chest pain Active Problems:   Kidney transplant as cause of abnormal reaction or later complication   CKD (chronic kidney disease), stage III   Atrial fibrillation   HTN (hypertension)    Patient Profile: 63 yo female w/ hx renal tx, PAF, chronic coumadin, CKD stage IV, anti-phospholipid antibody syndrome, HTN but no CAD was admitted 06/09 for chest pain. MV on 06/11 was abnormal.   SUBJECTIVE: Had some chest pressure overnight, none now. Very concerned about cath, gets very anxious when it is discussed.  OBJECTIVE Filed Vitals:   07/02/13 1533 07/02/13 1545 07/02/13 2034 07/03/13 0528  BP: 151/62 126/71 141/61 158/59  Pulse: 73 74 64 65  Temp: 97.8 F (36.6 C)  97.5 F (36.4 C) 97.9 F (36.6 C)  TempSrc: Oral  Oral Oral  Resp: 16  18 18   Weight:    165 lb (74.844 kg)  SpO2: 100%  100% 100%    Intake/Output Summary (Last 24 hours) at 07/03/13 0757 Last data filed at 07/03/13 0245  Gross per 24 hour  Intake      0 ml  Output    500 ml  Net   -500 ml   Filed Weights   07/03/13 0528  Weight: 165 lb (74.844 kg)    PHYSICAL EXAM General: Well developed, well nourished, female in no acute distress. Head: Normocephalic, atraumatic.  Neck: Supple without bruits, JVD not elevated. Lungs:  Resp regular and unlabored, CTA. Heart: RRR, S1, S2, no S3, S4, soft murmur; no rub. Abdomen: Soft, non-tender, non-distended, BS + x 4.  Extremities: No clubbing, cyanosis, no edema.  Neuro: Alert and oriented X 3. Moves all extremities spontaneously. Psych: Normal affect.  LABS: CBC: Recent Labs  06/30/13 1450  WBC 5.0  HGB 10.5*  HCT 32.1*  MCV 83.2  PLT 151   INR: Recent Labs  07/03/13 0330  INR 1.01*   Basic Metabolic Panel: Recent Labs  07/02/13 0509 07/03/13 0330  NA 138 138  K 4.5 4.6  CL 100 101  CO2 21 21  GLUCOSE 93 94  BUN 29* 28*  CREATININE 1.98* 1.92*    CALCIUM 10.0 9.8   Cardiac Enzymes: Recent Labs  06/30/13 1601 06/30/13 2236 07/01/13 0424  TROPONINI <0.30 <0.30 <0.30    Recent Labs  06/30/13 1500  TROPIPOC 0.12*   BNP: Pro B Natriuretic peptide (BNP)  Date/Time Value Ref Range Status  04/17/2011  9:34 PM 3723.0* 0 - 125 pg/mL Final  12/23/2008 12:40 PM 1206.0* 0.0 - 100.0 pg/mL Final   Fasting Lipid Panel: Recent Labs  07/02/13 0509  CHOL 181  HDL 42  LDLCALC 108*  TRIG 155*  CHOLHDL 4.3    TELE:  SR, no ectopy  Radiology/Studies: Nm Myocar Multi W/spect W/wall Motion / Ef 07/02/2013   CLINICAL DATA:  Patient is a 63 yo with history of chest pain. Test to evaluate/rule out ischemia.  EXAM: MYOCARDIAL IMAGING WITH SPECT (REST AND PHARMACOLOGIC-STRESS)  GATED LEFT VENTRICULAR WALL MOTION STUDY  LEFT VENTRICULAR EJECTION FRACTION  TECHNIQUE: Standard myocardial SPECT imaging was performed after resting intravenous injection of 10 mCi Tc-3m sestamibi. Subsequently, intravenous infusion of Lexiscan was performed under the supervision of the Cardiology staff. At peak effect of the drug, 30 mCi Tc-18m sestamibi was injected intravenously and standard myocardial SPECT imaging was performed. Quantitative gated imaging was also performed to evaluate left ventricular wall motion, and estimate left  ventricular ejection fraction.  COMPARISON:  None.  FINDINGS: Stress data: Baseline EKG: SR 67 bpm Nonspecific ST T wave changes. With infusion of lexiscan there was ST depression (0.5 to 1 mm) in the inferior and lateral leads. Incomplete normalization in recovery.  Nuclear data: In the initial stress images there was decreased tracer activity with a large defect in the inferior wall (base, mid, distal) inferolateral wall (base, mid, distal) distal anterior and apical wall.  In the recovery images there was incomplete normalization inf the distal anteiro and apex.  On gating, LVEF was calculated at 54% with normal wall motion. Review of the  raw data shows soft tissue (diaphragm) underlying the heart.  IMPRESSION: Lexiscan myoview: Electrically positive. Myoview scan with evidence of subendocardial scar and possible soft tissue attenuation in the inferior/inferolateral distal anterior and apical walls. with mild distal anterior/apical ischemia. LVEF 54%   Electronically Signed   By: Dorris Carnes M.D.   On: 07/02/2013 16:44     Current Medications:  . aspirin EC  81 mg Oral Daily  . brimonidine  1 drop Both Eyes TID  . calcitRIOL  0.25 mcg Oral QODAY  . calcitRIOL  0.5 mcg Oral QODAY  . famotidine  40 mg Oral Daily  . furosemide  20 mg Oral Daily  . labetalol  200 mg Oral Daily  . latanoprost  1 drop Both Eyes QHS  . mycophenolate  180 mg Oral BID  . sodium bicarbonate  650 mg Oral BID  . sodium chloride  3 mL Intravenous Q12H  . tacrolimus  4 mg Oral Daily   And  . tacrolimus  3 mg Oral q1800  . timolol  1 drop Both Eyes BID  . Warfarin - Pharmacist Dosing Inpatient   Does not apply q1800   . sodium chloride 50 mL/hr at 07/02/13 2153    ASSESSMENT AND PLAN: Principal Problem:  Chest pain - ez neg MI, MV 06/11 was abnormal, results above. Cath indicated but pt very reluctant. Nephrology to see. INR too high to cath, will have to hold coumadin. On ASA, BB, no statin, will add Pravachol 20 mg if OK with Nephrology.  Atrial fibrillation - SR on telemetry   Chronic anticoag - INR therapeutic, hold coum if cath planned as inpt.   Otherwise, per IM  Active Problems:  Kidney transplant as cause of abnormal reaction or later complication  CKD (chronic kidney disease), stage III  HTN (hypertension)  Signed, Rosaria Ferries , PA-C 7:57 AM 07/03/2013

## 2013-07-03 NOTE — Progress Notes (Signed)
UR completed. Patient changed to inpatient- abnormal stress test

## 2013-07-03 NOTE — Progress Notes (Signed)
ANTICOAGULATION CONSULT NOTE - Follow Up Consult  Pharmacy Consult for Warfarin (holding today) Indication: Hx Afib/antiphospholipid syndrome  Allergies  Allergen Reactions  . Gentamycin [Gentamicin Sulfate] Itching and Swelling  . Penicillins Itching  . Vancomycin Itching and Swelling    Patient Measurements: Weight: 165 lb (74.844 kg)  Vital Signs: Temp: 97.9 F (36.6 C) (06/12 0528) Temp src: Oral (06/12 0528) BP: 158/59 mmHg (06/12 0528) Pulse Rate: 65 (06/12 0528)  Labs:  Recent Labs  06/30/13 1450 06/30/13 1601 06/30/13 2236 07/01/13 0424 07/02/13 0509 07/03/13 0330  HGB 10.5*  --   --   --   --   --   HCT 32.1*  --   --   --   --   --   PLT 151  --   --   --   --   --   LABPROT  --   --   --  24.1* 23.1* 24.1*  INR  --   --   --  2.24* 2.12* 2.24*  CREATININE 1.87*  --   --  1.88* 1.98* 1.92*  TROPONINI  --  <0.30 <0.30 <0.30  --   --     The CrCl is unknown because both a height and weight (above a minimum accepted value) are required for this calculation.   Assessment: 56 YOF resumed on warfarin from [facility] for hx Afib/antiphospholipid syndrome with a therapeutic INR this morning (INR 2.24 << 2.12, goal of 2-3). Noted abnormal myoview on 6/11 - considering cath. Per cards - to hold warfarin this evening in case cath needed. If symptoms are controlled - may discharge on 6/13 however if they persist - will need cath on Monday. Will hold warfarin today and f/u plans.  Goal of Therapy:  INR 2-3   Plan:  1. Hold warfarin today (per cards) 2. Will continue to monitor for any signs/symptoms of bleeding and will follow up with PT/INR in the a.m.   Alycia Rossetti, PharmD, BCPS Clinical Pharmacist Pager: [phone] 07/03/2013 1:25 PM

## 2013-07-03 NOTE — Consult Note (Signed)
Debbie [name] is a 63 yo female patient with chronic SLE which caused renal failure requiring chronic dialysis and then renal transplantation on Jun 14, 2008. She has been maintained on routine chronic immunosuppression with tacrolimus and mycophenolate. 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  She presented with chest pain associated with exertion and the possibility of coronary angiography has been raised.  Serial labs reveal: 11/04/11 creat 1.52, 12/04/12 creat 1.7, 06/30/13 creat 1.87 and 07/03/13 creat 1.92.  Past Medical History  Diagnosis Date  . Kidney transplant as cause of abnormal reaction or later complication   . Hypertension   . Glaucoma   . Colon polyps   . Gall stones 1980  . Hematoma   . Anti-phospholipid antibody syndrome     (Based on hospital discharge summary march 2013)  . Atrial fibrillation   . Umbilical hernia 7482    pt denies this hx on 07/02/2013  . Carpal tunnel syndrome 2003    lt  . Esophageal stricture   . Hemorrhoids   . Renal disorder   . ESRD (end stage renal disease) on dialysis     "til I got my transplant in 2010"  . DVT (deep venous thrombosis) 1990    LLE  . Pneumonia     "twice" (07/02/2013)  . History of blood transfusion     "related to the lupus"  . SLE (systemic lupus erythematosus)   . Arthritis     "hands" (07/02/2013)   Past Surgical History  Procedure Laterality Date  . Nephrectomy transplanted organ  05/2008  . Cholecystectomy  1980  . Umbilical hernia repair  1998    pt denies this hx on 07/02/2013  . Hematoma evacuation      "after they took peritoneal catheter out"  . Peritoneal catheter insertion    . Peritoneal catheter removal    . Hernia repair      pt denies this hx on 07/02/2013  . Carpal tunnel release Left 07/2001  . Arteriovenous graft placement    . Thrombectomy and revision of arterioventous (av)  goretex  graft Right 07/1999; 04/2002; 09/2002; 12/2005; 09/2007;     upper arm/notes 07/31/1999; 05/18/2002; 10/07/2002; 01/14/2006; 09/14/2007;    Social History:  reports that she quit smoking about 36 years ago. She has never used smokeless tobacco. She reports that she does not drink alcohol or use illicit drugs. Allergies:  Allergies  Allergen Reactions  . Gentamycin [Gentamicin Sulfate] Itching and Swelling  . Penicillins Itching  . Vancomycin Itching and Swelling   Family History  Problem Relation Age of Onset  . Hypertension Mother   . Heart disease Mother   . Colon cancer Neg Hx   . Rectal cancer Neg Hx   . Stomach cancer Neg Hx     Medications:  Prior to Admission:  Prescriptions prior to admission  Medication Sig Dispense Refill  . ALPHAGAN P 0.1 % SOLN Place 1 drop into both eyes at bedtime.       Marland Kitchen aspirin EC 81 MG tablet Take 81 mg by mouth daily.      . calcitRIOL (ROCALTROL) 0.25 MCG capsule Take 0.25-0.5 mcg by mouth daily. Take 0.25 mcg daily alternating with 0.5 mcg      . furosemide (LASIX) 20 MG tablet Take 20 mg by mouth daily as needed. Excess edema      . labetalol (NORMODYNE) 200 MG tablet  Take 200 mg by mouth daily.       Marland Kitchen LUMIGAN 0.01 % SOLN Place 1 drop into both eyes at bedtime.       . mycophenolate (MYFORTIC) 180 MG EC tablet Take 180 mg by mouth 2 (two) times daily.       . ranitidine (ZANTAC) 150 MG tablet Take 150 mg by mouth daily.       . sodium bicarbonate 650 MG tablet Take 650 mg by mouth 2 (two) times daily.      . tacrolimus (PROGRAF) 1 MG capsule Take 3-4 mg by mouth See admin instructions. 4 caps in am and 3 caps in pm.      . timolol (BETIMOL) 0.5 % ophthalmic solution Place 1 drop into both eyes 2 (two) times daily.      Marland Kitchen warfarin (COUMADIN) 2.5 MG tablet Take 2.5 mg by mouth every evening.        Scheduled: . aspirin EC  81 mg Oral Daily  . brimonidine  1 drop Both Eyes TID  . calcitRIOL  0.25 mcg Oral QODAY  . calcitRIOL  0.5 mcg Oral  QODAY  . famotidine  40 mg Oral Daily  . furosemide  20 mg Oral Daily  . isosorbide mononitrate  60 mg Oral Daily  . labetalol  200 mg Oral Daily  . latanoprost  1 drop Both Eyes QHS  . mycophenolate  180 mg Oral BID  . sodium bicarbonate  650 mg Oral BID  . sodium chloride  3 mL Intravenous Q12H  . tacrolimus  4 mg Oral Daily   And  . tacrolimus  3 mg Oral q1800  . timolol  1 drop Both Eyes BID  . Warfarin - Pharmacist Dosing Inpatient   Does not apply q1800   ROS: noncontributory to current admission  Blood pressure 158/65, pulse 67, temperature 98 F (36.7 C), temperature source Oral, resp. rate 18, height 5' 4"  (1.626 m), weight 74.844 kg (165 lb), SpO2 99.00%.  General appearance: alert and cooperative Head: Normocephalic, without obvious abnormality, atraumatic Eyes: negative Ears: normal TM's and external ear canals both ears Nose: Nares normal. Septum midline. Mucosa normal. No drainage or sinus tenderness. Throat: lips, mucosa, and tongue normal; teeth and gums normal Resp: clear to auscultation bilaterally Chest wall: no tenderness Cardio: regular rate and rhythm, S1, S2 normal, no murmur, click, rub or gallop GI: abd soft renal allograft RLQ nontender Extremities: extremities normal, atraumatic, no cyanosis or edema  AVG rUE functioning, multiple old AVGs in arms bilat Skin: Skin color, texture, turgor normal. No rashes or lesions Neurologic: Grossly normal Results for orders placed during the hospital encounter of 06/30/13 (from the past 48 hour(s))  BASIC METABOLIC PANEL     Status: Abnormal   Collection Time    07/02/13  5:09 AM      Result Value Ref Range   Sodium 138  137 - 147 mEq/L   Potassium 4.5  3.7 - 5.3 mEq/L   Chloride 100  96 - 112 mEq/L   CO2 21  19 - 32 mEq/L   Glucose, Bld 93  70 - 99 mg/dL   BUN 29 (*) 6 - 23 mg/dL   Creatinine, Ser 1.98 (*) 0.50 - 1.10 mg/dL   Calcium 10.0  8.4 - 10.5 mg/dL   GFR calc non Af Amer 26 (*) >90 mL/min   GFR calc  Af Amer 30 (*) >90 mL/min   Comment: (NOTE)     The eGFR has been  calculated using the CKD EPI equation.     This calculation has not been validated in all clinical situations.     eGFR's persistently <90 mL/min signify possible Chronic Kidney     Disease.  PROTIME-INR     Status: Abnormal   Collection Time    07/02/13  5:09 AM      Result Value Ref Range   Prothrombin Time 23.1 (*) 11.6 - 15.2 seconds   INR 2.12 (*) 0.00 - 1.49  LIPID PANEL     Status: Abnormal   Collection Time    07/02/13  5:09 AM      Result Value Ref Range   Cholesterol 181  0 - 200 mg/dL   Triglycerides 155 (*) <150 mg/dL   HDL 42  >39 mg/dL   Total CHOL/HDL Ratio 4.3     VLDL 31  0 - 40 mg/dL   LDL Cholesterol 108 (*) 0 - 99 mg/dL   Comment:            Total Cholesterol/HDL:CHD Risk     Coronary Heart Disease Risk Table                         Men   Women      1/2 Average Risk   3.4   3.3      Average Risk       5.0   4.4      2 X Average Risk   9.6   7.1      3 X Average Risk  23.4   11.0                Use the calculated Patient Ratio     above and the CHD Risk Table     to determine the patient's CHD Risk.                ATP III CLASSIFICATION (LDL):      <100     mg/dL   Optimal      100-129  mg/dL   Near or Above                        Optimal      130-159  mg/dL   Borderline      160-189  mg/dL   High      >190     mg/dL   Very High  BASIC METABOLIC PANEL     Status: Abnormal   Collection Time    07/03/13  3:30 AM      Result Value Ref Range   Sodium 138  137 - 147 mEq/L   Potassium 4.6  3.7 - 5.3 mEq/L   Chloride 101  96 - 112 mEq/L   CO2 21  19 - 32 mEq/L   Glucose, Bld 94  70 - 99 mg/dL   BUN 28 (*) 6 - 23 mg/dL   Creatinine, Ser 1.92 (*) 0.50 - 1.10 mg/dL   Calcium 9.8  8.4 - 10.5 mg/dL   GFR calc non Af Amer 26 (*) >90 mL/min   GFR calc Af Amer 31 (*) >90 mL/min   Comment: (NOTE)     The eGFR has been calculated using the CKD EPI equation.     This calculation has not been  validated in all clinical situations.     eGFR's persistently <90 mL/min signify possible Chronic Kidney     Disease.  PROTIME-INR  Status: Abnormal   Collection Time    07/03/13  3:30 AM      Result Value Ref Range   Prothrombin Time 24.1 (*) 11.6 - 15.2 seconds   INR 2.24 (*) 0.00 - 1.49   Nm Myocar Multi W/spect W/wall Motion / Ef  07/02/2013   CLINICAL DATA:  Patient is a 63 yo with history of chest pain. Test to evaluate/rule out ischemia.  EXAM: MYOCARDIAL IMAGING WITH SPECT (REST AND PHARMACOLOGIC-STRESS)  GATED LEFT VENTRICULAR WALL MOTION STUDY  LEFT VENTRICULAR EJECTION FRACTION  TECHNIQUE: Standard myocardial SPECT imaging was performed after resting intravenous injection of 10 mCi Tc-40msestamibi. Subsequently, intravenous infusion of Lexiscan was performed under the supervision of the Cardiology staff. At peak effect of the drug, 30 mCi Tc-927mestamibi was injected intravenously and standard myocardial SPECT imaging was performed. Quantitative gated imaging was also performed to evaluate left ventricular wall motion, and estimate left ventricular ejection fraction.  COMPARISON:  None.  FINDINGS: Stress data: Baseline EKG: SR 67 bpm Nonspecific ST T wave changes. With infusion of lexiscan there was ST depression (0.5 to 1 mm) in the inferior and lateral leads. Incomplete normalization in recovery.  Nuclear data: In the initial stress images there was decreased tracer activity with a large defect in the inferior wall (base, mid, distal) inferolateral wall (base, mid, distal) distal anterior and apical wall.  In the recovery images there was incomplete normalization inf the distal anteiro and apex.  On gating, LVEF was calculated at 54% with normal wall motion. Review of the raw data shows soft tissue (diaphragm) underlying the heart.  IMPRESSION: Lexiscan myoview: Electrically positive. Myoview scan with evidence of subendocardial scar and possible soft tissue attenuation in the  inferior/inferolateral distal anterior and apical walls. with mild distal anterior/apical ischemia. LVEF 54%   Electronically Signed   By: PaDorris Carnes.D.   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

## 2013-07-03 NOTE — Progress Notes (Addendum)
The myoview was reviewed. It is not a high risk study. Symptoms are occurring only at night and awakening her from sleep. Discomfort lasts < 10 minutes. I discussed options with the patient: 1.) Coronary angio with minimal contrast (preferred) to help tailor therapy. Vs. 2.) Empiric medical therapy.  She leans toward a non invasive approach. Assuming symptoms are ischemic and they improve on therapy, a conservative approach may be reasonable.  No cath today given elevated INR. If continued symptoms on LA nitrate, would rec cath on Monday and holding coumadin, adding heparin when INR < 2.0.  I will hold coumadin today to avoid issues if cath on Monday. If controlled symptoms, would resume coumadin tomorrow.  If symptoms controlled over next 24 hours, okay to discharge and f/u as OP.

## 2013-07-03 NOTE — Progress Notes (Signed)
[facility] PROGRESS NOTE  Debbie [name] XBD:[id number] DOB: [birth year] DOA: 06/30/2013 PCP: Geoffry Paradise, MD  Assessment/Plan: 1.Chest pain.  - Follow up serial cardiac enzymes neg - Cardiology consulted and are following - Stress test on 6/11 was found to be abnormal - Per cardiology recs, started pt on 50cc/hr saline and would notify Nephrology in anticipation of potential cardiac intervention 2. 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 Creatinine appears to be near baseline. Continued Prograf, sodium bicarbonate and Lasix along with calcitriol.  - Per above, in anticipation of ? cardiac intervention, had discussed case with Nephrology who will see in consultation 3. Hypertension. We'll continue home medications which include beta blocker and low-dose Lasix.  4. Glaucoma. No acute issues eyedrops at home dose.  5. History of atrial fibrillation. Currently in sinus. Cont beta blocker and coumadin  Code Status: Full Family Communication: Pt in room Disposition Plan: Pending   Consultants:  Cardiology  Procedures:    Antibiotics:    HPI/Subjective: No complaints. No cp  Objective: Filed Vitals:   07/02/13 1533 07/02/13 1545 07/02/13 2034 07/03/13 0528  BP: 151/62 126/71 141/61 158/59  Pulse: 73 74 64 65  Temp: 97.8 F (36.6 C)  97.5 F (36.4 C) 97.9 F (36.6 C)  TempSrc: Oral  Oral Oral  Resp: 16  18 18   Weight:    165 lb (74.844 kg)  SpO2: 100%  100% 100%    Intake/Output Summary (Last 24 hours) at 07/03/13 0756 Last data filed at 07/03/13 0245  Gross per 24 hour  Intake      0 ml  Output    500 ml  Net   -500 ml   Filed Weights   07/03/13 0528  Weight: 165 lb (74.844 kg)    Exam:   General:  Awake, in nad  Cardiovascular: regular,s 1, s2  Respiratory: normal resp effort, no wheezing  Abdomen: soft,nondistended  Musculoskeletal: perfused, no clubbing   Data Reviewed: Basic Metabolic Panel:  Recent Labs Lab  06/30/13 1450 07/01/13 0424 07/02/13 0509 07/03/13 0330  NA 141 141 138 138  K 4.6 4.3 4.5 4.6  CL 106 105 100 101  CO2 21 19 21 21   GLUCOSE 97 118* 93 94  BUN 29* 26* 29* 28*  CREATININE 1.87* 1.88* 1.98* 1.92*  CALCIUM 10.0 9.7 10.0 9.8   Liver Function Tests: No results found for this basename: AST, ALT, ALKPHOS, BILITOT, PROT, ALBUMIN,  in the last 168 hours No results found for this basename: LIPASE, AMYLASE,  in the last 168 hours No results found for this basename: AMMONIA,  in the last 168 hours CBC:  Recent Labs Lab 06/30/13 1450  WBC 5.0  HGB 10.5*  HCT 32.1*  MCV 83.2  PLT 151   Cardiac Enzymes:  Recent Labs Lab 06/30/13 1601 06/30/13 2236 07/01/13 0424  TROPONINI <0.30 <0.30 <0.30   BNP (last 3 results) No results found for this basename: PROBNP,  in the last 8760 hours CBG: No results found for this basename: GLUCAP,  in the last 168 hours  No results found for this or any previous visit (from the past 240 hour(s)).   Studies: Nm Myocar Multi W/spect W/wall Motion / Ef  07/02/2013   CLINICAL DATA:  Patient is a 63 yo with history of chest pain. Test to evaluate/rule out ischemia.  EXAM: MYOCARDIAL IMAGING WITH SPECT (REST AND PHARMACOLOGIC-STRESS)  GATED LEFT VENTRICULAR WALL MOTION STUDY  LEFT VENTRICULAR EJECTION FRACTION  TECHNIQUE: Standard myocardial SPECT imaging  was performed after resting intravenous injection of 10 mCi Tc-33m sestamibi. Subsequently, intravenous infusion of Lexiscan was performed under the supervision of the Cardiology staff. At peak effect of the drug, 30 mCi Tc-37m sestamibi was injected intravenously and standard myocardial SPECT imaging was performed. Quantitative gated imaging was also performed to evaluate left ventricular wall motion, and estimate left ventricular ejection fraction.  COMPARISON:  None.  FINDINGS: Stress data: Baseline EKG: SR 67 bpm Nonspecific ST T wave changes. With infusion of lexiscan there was ST  depression (0.5 to 1 mm) in the inferior and lateral leads. Incomplete normalization in recovery.  Nuclear data: In the initial stress images there was decreased tracer activity with a large defect in the inferior wall (base, mid, distal) inferolateral wall (base, mid, distal) distal anterior and apical wall.  In the recovery images there was incomplete normalization inf the distal anteiro and apex.  On gating, LVEF was calculated at 54% with normal wall motion. Review of the raw data shows soft tissue (diaphragm) underlying the heart.  IMPRESSION: Lexiscan myoview: Electrically positive. Myoview scan with evidence of subendocardial scar and possible soft tissue attenuation in the inferior/inferolateral distal anterior and apical walls. with mild distal anterior/apical ischemia. LVEF 54%   Electronically Signed   By: Dorris Carnes M.D.   On: 07/02/2013 16:44    Scheduled Meds: . aspirin EC  81 mg Oral Daily  . brimonidine  1 drop Both Eyes TID  . calcitRIOL  0.25 mcg Oral QODAY  . calcitRIOL  0.5 mcg Oral QODAY  . famotidine  40 mg Oral Daily  . furosemide  20 mg Oral Daily  . labetalol  200 mg Oral Daily  . latanoprost  1 drop Both Eyes QHS  . mycophenolate  180 mg Oral BID  . sodium bicarbonate  650 mg Oral BID  . sodium chloride  3 mL Intravenous Q12H  . tacrolimus  4 mg Oral Daily   And  . tacrolimus  3 mg Oral q1800  . timolol  1 drop Both Eyes BID  . Warfarin - Pharmacist Dosing Inpatient   Does not apply q1800   Continuous Infusions: . sodium chloride 50 mL/hr at 07/02/13 2153    Principal Problem:   Chest pain Active Problems:   Kidney transplant as cause of abnormal reaction or later complication   CKD (chronic kidney disease), stage III   Atrial fibrillation   HTN (hypertension)  Time spent: 1min  Tyler Cubit, [place] [facility] Pager [phone]. If 7PM-7AM, please contact night-coverage at [email or web address], password [facility] 07/03/2013, 7:56 AM  LOS: 3 days

## 2013-07-04 LAB — HEPARIN LEVEL (UNFRACTIONATED): HEPARIN UNFRACTIONATED: 0.6 [IU]/mL (ref 0.30–0.70)

## 2013-07-04 LAB — CBC
HCT: 32.5 % — ABNORMAL LOW (ref 36.0–46.0)
Hemoglobin: 10.6 g/dL — ABNORMAL LOW (ref 12.0–15.0)
MCH: 27.2 pg (ref 26.0–34.0)
MCHC: 32.6 g/dL (ref 30.0–36.0)
MCV: 83.5 fL (ref 78.0–100.0)
PLATELETS: 158 10*3/uL (ref 150–400)
RBC: 3.89 MIL/uL (ref 3.87–5.11)
RDW: 14.3 % (ref 11.5–15.5)
WBC: 5.7 10*3/uL (ref 4.0–10.5)

## 2013-07-04 LAB — BASIC METABOLIC PANEL
BUN: 29 mg/dL — AB (ref 6–23)
CO2: 22 mEq/L (ref 19–32)
CREATININE: 2.04 mg/dL — AB (ref 0.50–1.10)
Calcium: 9.8 mg/dL (ref 8.4–10.5)
Chloride: 101 mEq/L (ref 96–112)
GFR calc Af Amer: 29 mL/min — ABNORMAL LOW (ref >90)
GFR, EST NON AFRICAN AMERICAN: 25 mL/min — AB (ref >90)
Glucose, Bld: 95 mg/dL (ref 70–99)
POTASSIUM: 4.5 meq/L (ref 3.7–5.3)
Sodium: 137 mEq/L (ref 137–147)

## 2013-07-04 LAB — PROTIME-INR
INR: 1.82 — ABNORMAL HIGH (ref 0.00–1.49)
PROTHROMBIN TIME: 20.5 s — AB (ref 11.6–15.2)

## 2013-07-04 MED ORDER — ISOSORBIDE MONONITRATE ER 30 MG PO TB24
30.0000 mg | ORAL_TABLET | Freq: Every day | ORAL | Status: DC
  Administered 2013-07-05: 30 mg via ORAL
  Filled 2013-07-04 (×2): qty 1

## 2013-07-04 MED ORDER — BIMATOPROST 0.01 % OP SOLN
1.0000 [drp] | Freq: Every day | OPHTHALMIC | Status: DC
Start: 2013-07-04 — End: 2013-07-06
  Administered 2013-07-04 – 2013-07-05 (×2): 1 [drp] via OPHTHALMIC
  Filled 2013-07-04: qty 2.5

## 2013-07-04 MED ORDER — ACETAMINOPHEN 325 MG PO TABS
650.0000 mg | ORAL_TABLET | Freq: Once | ORAL | Status: AC
  Administered 2013-07-04: 650 mg via ORAL
  Filled 2013-07-04: qty 2

## 2013-07-04 MED ORDER — NON FORMULARY: 1.0000 [drp] | Freq: Every day | Status: DC

## 2013-07-04 MED ORDER — RANOLAZINE ER 500 MG PO TB12
500.0000 mg | ORAL_TABLET | Freq: Two times a day (BID) | ORAL | Status: DC
  Administered 2013-07-04 – 2013-07-05 (×4): 500 mg via ORAL
  Filled 2013-07-04 (×6): qty 1

## 2013-07-04 MED ORDER — WARFARIN SODIUM 5 MG PO TABS
5.0000 mg | ORAL_TABLET | Freq: Once | ORAL | Status: AC
  Administered 2013-07-04: 5 mg via ORAL
  Filled 2013-07-04: qty 1

## 2013-07-04 MED ORDER — HEPARIN (PORCINE) IN NACL 100-0.45 UNIT/ML-% IJ SOLN
950.0000 [IU]/h | INTRAMUSCULAR | Status: DC
  Administered 2013-07-04: 950 [IU]/h via INTRAVENOUS
  Filled 2013-07-04: qty 250

## 2013-07-04 NOTE — Care Management Note (Signed)
    Page 1 of 2   07/06/2013     2:15:49 PM CARE MANAGEMENT NOTE 07/06/2013  Patient:  Debbie [name], Debbie [name]   Account Number:  [id number]  Date Initiated:  07/04/2013  Documentation initiated by:  GRAVES-BIGELOW,Carolos Fecher  Subjective/Objective Assessment:   Pt presented with cp associated with exertion. Pt was to have a cath and INR elevated. Cardiology and Renal following.     Action/Plan:   CM will continue to monitor for disposition needs.   Anticipated DC Date:  07/05/2013   Anticipated DC Plan:  [place]  CM consult      PAC Choice  [place]   Choice offered to / List presented to:  C-1 Patient   DME arranged  [facility]      DME agency  [place] arranged  HH-1 RN  [place] [facility]      [place].   Status of service:  Completed, signed off Medicare Important Message given?  YES (If response is "NO", the following Medicare IM given date fields will be blank) Date Medicare IM given:  07/05/2013 Date Additional Medicare IM given:    Discharge Disposition:  [place]  Per UR Regulation:  Reviewed for med. necessity/level of care/duration of stay  If discussed at [place] of Stay Meetings, dates discussed:    Comments:   1411 Jacqlyn Krauss, RN,BSN [phone] CM did make referral for [facility] services listed above. CM did ask pt who would be able to give her lovenox injections and she stated her daughter vs husband. [place] has medication lovenox available. Syringes available in 80 mg amount. DME to be delivered to room before d/c.  07-06-13 1211 Jacqlyn Krauss, RN,BSN [phone] Benefits check completed: enoxoparin sodium: estimated co-pay at retail $2.65; no auth required patient can use [facility]

## 2013-07-04 NOTE — Progress Notes (Signed)
[facility] PROGRESS NOTE  Debbie [name] MEQ:[id number] DOB: 07/25/1950 DOA: 06/30/2013 PCP: Geoffry Paradise, MD  Assessment/Plan: 1.Chest pain.  - Follow up serial cardiac enzymes neg - Cardiology consulted and are following - Stress test on 6/11 was found to be abnormal - Symptoms improved - Medication changes per Cardiology noted - Cardiology recs for Cardiac rehab 2. 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 Creatinine appears to be near baseline. Continued Prograf, sodium bicarbonate and Lasix along with calcitriol.  - Nephrology following 3. Hypertension. We'll continue home medications which include beta blocker and low-dose Lasix.  4. Glaucoma. No acute issues eyedrops at home dose.  5. History of atrial fibrillation. Currently in sinus. Cont beta blocker and coumadin  Code Status: Full Family Communication: Pt and daughter Disposition Plan: Pending   Consultants:  Cardiology  Cardiac Rehab  Procedures:    Antibiotics:    HPI/Subjective: No complaints. Eager to go home  Objective: Filed Vitals:   07/03/13 1500 07/03/13 2100 07/04/13 0514 07/04/13 1303  BP:  116/59 155/61 144/64  Pulse:  70 70 66  Temp:  97.7 F (36.5 C) 98.1 F (36.7 C) 98.1 F (36.7 C)  TempSrc:  Oral Oral Axillary  Resp:  15 15 17   Height: 5\' 4"  (1.626 m)     Weight:      SpO2:  95% 100% 100%    Intake/Output Summary (Last 24 hours) at 07/04/13 1618 Last data filed at 07/04/13 1300  Gross per 24 hour  Intake    663 ml  Output      0 ml  Net    663 ml   Filed Weights   07/03/13 0528  Weight: 165 lb (74.844 kg)    Exam:   General:  Awake, in nad  Cardiovascular: regular,s 1, s2  Respiratory: normal resp effort, no wheezing  Abdomen: soft,nondistended  Musculoskeletal: perfused, no clubbing   Data Reviewed: Basic Metabolic Panel:  Recent Labs Lab 06/30/13 1450 07/01/13 0424 07/02/13 0509 07/03/13 0330 07/04/13 0345  NA 141 141 138 138  137  K 4.6 4.3 4.5 4.6 4.5  CL 106 105 100 101 101  CO2 21 19 21 21 22   GLUCOSE 97 118* 93 94 95  BUN 29* 26* 29* 28* 29*  CREATININE 1.87* 1.88* 1.98* 1.92* 2.04*  CALCIUM 10.0 9.7 10.0 9.8 9.8   Liver Function Tests: No results found for this basename: AST, ALT, ALKPHOS, BILITOT, PROT, ALBUMIN,  in the last 168 hours No results found for this basename: LIPASE, AMYLASE,  in the last 168 hours No results found for this basename: AMMONIA,  in the last 168 hours CBC:  Recent Labs Lab 06/30/13 1450  WBC 5.0  HGB 10.5*  HCT 32.1*  MCV 83.2  PLT 151   Cardiac Enzymes:  Recent Labs Lab 06/30/13 1601 06/30/13 2236 07/01/13 0424  TROPONINI <0.30 <0.30 <0.30   BNP (last 3 results) No results found for this basename: PROBNP,  in the last 8760 hours CBG: No results found for this basename: GLUCAP,  in the last 168 hours  No results found for this or any previous visit (from the past 240 hour(s)).   Studies: No results found.  Scheduled Meds: . aspirin EC  81 mg Oral Daily  . brimonidine  1 drop Both Eyes TID  . calcitRIOL  0.25 mcg Oral QODAY  . calcitRIOL  0.5 mcg Oral QODAY  . famotidine  40 mg Oral Daily  . furosemide  20 mg Oral Daily  . [  START ON 07/05/2013] isosorbide mononitrate  30 mg Oral Daily  . labetalol  200 mg Oral Daily  . latanoprost  1 drop Both Eyes QHS  . mycophenolate  180 mg Oral BID  . ranolazine  500 mg Oral BID  . sodium bicarbonate  650 mg Oral BID  . sodium chloride  3 mL Intravenous Q12H  . tacrolimus  4 mg Oral Daily   And  . tacrolimus  3 mg Oral q1800  . timolol  1 drop Both Eyes BID  . warfarin  5 mg Oral ONCE-1800  . Warfarin - Pharmacist Dosing Inpatient   Does not apply q1800   Continuous Infusions: . sodium chloride 50 mL/hr at 07/02/13 2153  . heparin 950 Units/hr (07/04/13 0948)    Principal Problem:   Chest pain Active Problems:   Kidney transplant as cause of abnormal reaction or later complication   CKD (chronic  kidney disease), stage III   Atrial fibrillation   HTN (hypertension)  Time spent: 40min  [facility], [place] [facility] Pager [phone]. If 7PM-7AM, please contact night-coverage at [email or web address], password [facility] 07/04/2013, 4:18 PM  LOS: 4 days

## 2013-07-04 NOTE — Progress Notes (Signed)
Subjective:  Had a bad headache from the nitrates last night but feels better today. No ischemic pain overnight.  Objective:  Vital Signs in the last 24 hours: BP 155/61  Pulse 70  Temp(Src) 98.1 F (36.7 C) (Oral)  Resp 15  Ht 5\' 4"  (1.626 m)  Wt 74.844 kg (165 lb)  BMI 28.31 kg/m2  SpO2 100%  Physical Exam: Pleasant mildly obese black female in no acute distress  Lungs:  Clear Cardiac:  Regular rhythm, normal S1 and S2, no S3, 9-2/1 systolic murmur Extremities:  No edema present  Intake/Output from previous day: 06/12 0701 - 06/13 0700 In: 880 [P.O.:880] Out: -   Weight Filed Weights   07/03/13 0528  Weight: 74.844 kg (165 lb)    Lab Results: Basic Metabolic Panel:  Recent Labs  07/03/13 0330 07/04/13 0345  NA 138 137  K 4.6 4.5  CL 101 101  CO2 21 22  GLUCOSE 94 95  BUN 28* 29*  CREATININE 1.92* 2.04*    Telemetry: Sinus rhythm  Assessment/Plan:  1. Chest discomfort has improved 2. Atrial fibrillation 3. Hypertension 4. Prior renal transplant with chronic kidney disease  Recommendations:  Her major complaint is a headache. She has no recurrent ischemic chest pain since yesterday. I would restart her warfarin and stop her heparin. Get her to ambulate. Ask cardiac rehabilitation to see her. I am going to cut her Imdur down because of the headache and add Ranexa.     Kerry Hough  MD [facility]  07/04/2013, 12:27 PM

## 2013-07-04 NOTE — Progress Notes (Addendum)
ANTICOAGULATION CONSULT NOTE - Follow-Up Consult  Pharmacy Consult for Heparin + Warfarin Indication: Hx Afib/antiphospholipid syndrome  Allergies  Allergen Reactions  . Gentamycin [Gentamicin Sulfate] Itching and Swelling  . Penicillins Itching  . Vancomycin Itching and Swelling    Patient Measurements: Height: 5\' 4"  (162.6 cm) Weight: 165 lb (74.844 kg) IBW/kg (Calculated) : 54.7 Heparin Dosing Weight: 70.3 kg  Vital Signs: Temp: 98.1 F (36.7 C) (06/13 0514) Temp src: Oral (06/13 0514) BP: 155/61 mmHg (06/13 0514) Pulse Rate: 70 (06/13 0514)  Labs:  Recent Labs  07/02/13 0509 07/03/13 0330 07/04/13 0345  LABPROT 23.1* 24.1* 20.5*  INR 2.12* 2.24* 1.82*  CREATININE 1.98* 1.92* 2.04*    Estimated Creatinine Clearance: 27.6 ml/min (by C-G formula based on Cr of 2.04).   Assessment: 70 YOF on warfarin PTA for hx Afib/antiphospholipid syndrome held on 6/13 while awaiting cath plans per cardiology after the patient had an abnormal myoview on 6/11. INR now <2 this morning and pharmacy consulted to start heparin for bridging. Heparin weight: 70.3 kg - will not bolus since INR elevated. Will await plans for cardiac cath.  Will continue to hold warfarin while the patient is under evaluation for cardiac cath.   Goal of Therapy:  Heparin level 0.3-0.7 units/ml Monitor platelets by anticoagulation protocol: Yes   Plan:  1. Initiate heparin at 950 units/hr (9.5 ml/hr) 2. Continue to hold warfarin while awaiting plans for cardiac cath 3. Daily HL, PT/INR 4. Will continue to monitor for any signs/symptoms of bleeding and will follow up with heparin level in 8 hours   Alycia Rossetti, PharmD, BCPS Clinical Pharmacist Pager: [phone] 07/04/2013 7:50 AM    ------------------------------------------------------------------------- Addendum:  Cardiology has rounded today and plans are to resume warfarin at this time as the patient had no recurrent CP. Will plan to continue  heparin bridge until INR>2.  Plan:  1. Warfarin 5 mg x 1 dose at 1800 today 2. Continue heparin at 950 units/hr (9.5 ml/hr) 3. Will f/u with a 8 hr HL as planned and d/c when INR>2 4. Will continue to monitor for any signs/symptoms of bleeding and will follow up with PT/INR in the a.m.   Alycia Rossetti, PharmD, BCPS Clinical Pharmacist Pager: [phone] 07/04/2013 12:59 PM

## 2013-07-04 NOTE — Progress Notes (Signed)
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  Plan:  1 Moderate risk for CIN given underlying allograft dysfunction  2 If contrast needed, pre hydrate, limit contrast and avoid hemodynamic instability as you are aware  Subjective: Interval History: No cp or sob  Objective: Vital signs in last 24 hours: Temp:  [97.7 F (36.5 C)-98.1 F (36.7 C)] 98.1 F (36.7 C) (06/13 0514) Pulse Rate:  [67-70] 70 (06/13 0514) Resp:  [15-18] 15 (06/13 0514) BP: (116-158)/(59-65) 155/61 mmHg (06/13 0514) SpO2:  [95 %-100 %] 100 % (06/13 0514) Weight change:   Intake/Output from previous day: 06/12 0701 - 06/13 0700 In: 78 [P.O.:880] Out: -  Intake/Output this shift: Total I/O In: 63 [P.O.:60; I.V.:3] Out: -   General appearance: alert and appears stated age Resp: clear to auscultation bilaterally Chest wall: no tenderness Cardio: regular rate and rhythm, S1, S2 normal, no murmur, click, rub or gallop Extremities: extremities normal, atraumatic, no cyanosis or edema  RUE AVG  Lab Results: No results found for this basename: WBC, HGB, HCT, PLT,  in the last 72 hours BMET:  Recent Labs  07/03/13 0330 07/04/13 0345  NA 138 137  K 4.6 4.5  CL 101 101  CO2 21 22  GLUCOSE 94 95  BUN 28* 29*  CREATININE 1.92* 2.04*  CALCIUM 9.8 9.8   No results found for this basename: PTH,  in the last 72 hours Iron Studies: No results found for this basename: IRON, TIBC, TRANSFERRIN, FERRITIN,  in the last 72 hours Studies/Results: Nm Myocar Multi W/spect W/wall Motion / Ef  07/02/2013   CLINICAL DATA:  Patient is a 63 yo with history of chest pain. Test to evaluate/rule out ischemia.  EXAM: MYOCARDIAL IMAGING WITH SPECT (REST AND PHARMACOLOGIC-STRESS)  GATED LEFT VENTRICULAR WALL MOTION STUDY  LEFT VENTRICULAR EJECTION FRACTION  TECHNIQUE: Standard myocardial SPECT imaging was performed after resting intravenous injection of 10 mCi Tc-11m sestamibi. Subsequently,  intravenous infusion of Lexiscan was performed under the supervision of the Cardiology staff. At peak effect of the drug, 30 mCi Tc-101m sestamibi was injected intravenously and standard myocardial SPECT imaging was performed. Quantitative gated imaging was also performed to evaluate left ventricular wall motion, and estimate left ventricular ejection fraction.  COMPARISON:  None.  FINDINGS: Stress data: Baseline EKG: SR 67 bpm Nonspecific ST T wave changes. With infusion of lexiscan there was ST depression (0.5 to 1 mm) in the inferior and lateral leads. Incomplete normalization in recovery.  Nuclear data: In the initial stress images there was decreased tracer activity with a large defect in the inferior wall (base, mid, distal) inferolateral wall (base, mid, distal) distal anterior and apical wall.  In the recovery images there was incomplete normalization inf the distal anteiro and apex.  On gating, LVEF was calculated at 54% with normal wall motion. Review of the raw data shows soft tissue (diaphragm) underlying the heart.  IMPRESSION: Lexiscan myoview: Electrically positive. Myoview scan with evidence of subendocardial scar and possible soft tissue attenuation in the inferior/inferolateral distal anterior and apical walls. with mild distal anterior/apical ischemia. LVEF 54%   Electronically Signed   By: Dorris Carnes M.D.   On: 07/02/2013 16:44   Scheduled: . aspirin EC  81 mg Oral Daily  . brimonidine  1 drop Both Eyes TID  . calcitRIOL  0.25 mcg Oral QODAY  . calcitRIOL  0.5 mcg Oral QODAY  . famotidine  40 mg Oral Daily  . furosemide  20 mg Oral Daily  . isosorbide mononitrate  60 mg Oral Daily  . labetalol  200 mg Oral Daily  . latanoprost  1 drop Both Eyes QHS  . mycophenolate  180 mg Oral BID  . sodium bicarbonate  650 mg Oral BID  . sodium chloride  3 mL Intravenous Q12H  . tacrolimus  4 mg Oral Daily   And  . tacrolimus  3 mg Oral q1800  . timolol  1 drop Both Eyes BID  . Warfarin -  Pharmacist Dosing Inpatient   Does not apply q1800     LOS: 4 days   Dewight Catino C 07/04/2013,12:01 PM

## 2013-07-04 NOTE — Progress Notes (Signed)
ANTICOAGULATION CONSULT NOTE - Follow Up Consult  Pharmacy Consult for Heparin Indication:Hx Afib/antiphospholipid syndrome  Allergies  Allergen Reactions  . Gentamycin [Gentamicin Sulfate] Itching and Swelling  . Penicillins Itching  . Vancomycin Itching and Swelling    Patient Measurements: Height: 5\' 4"  (162.6 cm) Weight: 165 lb (74.844 kg) IBW/kg (Calculated) : 54.7 Heparin Dosing Weight: 70.3kg  Vital Signs: Temp: 98.1 F (36.7 C) (06/13 1303) Temp src: Axillary (06/13 1303) BP: 144/64 mmHg (06/13 1303) Pulse Rate: 66 (06/13 1303)  Labs:  Recent Labs  07/02/13 0509 07/03/13 0330 07/04/13 0345 07/04/13 1630  HGB  --   --   --  10.6*  HCT  --   --   --  32.5*  PLT  --   --   --  158  LABPROT 23.1* 24.1* 20.5*  --   INR 2.12* 2.24* 1.82*  --   HEPARINUNFRC  --   --   --  0.60  CREATININE 1.98* 1.92* 2.04*  --     Estimated Creatinine Clearance: 27.6 ml/min (by C-G formula based on Cr of 2.04).   Medications:  Heparin @ 950 units/hr  Assessment: 63yof initiated on IV heparin this morning in setting of subtherapeutic INR. First heparin level is therapeutic at 0.60. No bleeding reported.  Goal of Therapy:  Heparin level 0.3-0.7 units/ml Monitor platelets by anticoagulation protocol: Yes   Plan:  1) Continue heparin at 950 units/hr 2) Recheck heparin level in 8 hours to confirm  Deboraha Sprang 07/04/2013,5:56 PM

## 2013-07-05 DIAGNOSIS — T8610 Unspecified complication of kidney transplant: Secondary | ICD-10-CM

## 2013-07-05 LAB — BASIC METABOLIC PANEL
BUN: 35 mg/dL — ABNORMAL HIGH (ref 6–23)
CHLORIDE: 101 meq/L (ref 96–112)
CO2: 21 mEq/L (ref 19–32)
Calcium: 9.8 mg/dL (ref 8.4–10.5)
Creatinine, Ser: 2.21 mg/dL — ABNORMAL HIGH (ref 0.50–1.10)
GFR calc non Af Amer: 22 mL/min — ABNORMAL LOW (ref >90)
GFR, EST AFRICAN AMERICAN: 26 mL/min — AB (ref >90)
Glucose, Bld: 104 mg/dL — ABNORMAL HIGH (ref 70–99)
Potassium: 4.5 mEq/L (ref 3.7–5.3)
Sodium: 140 mEq/L (ref 137–147)

## 2013-07-05 LAB — PROTIME-INR
INR: 1.48 (ref 0.00–1.49)
PROTHROMBIN TIME: 17.5 s — AB (ref 11.6–15.2)

## 2013-07-05 MED ORDER — TACROLIMUS 1 MG PO CAPS
3.0000 mg | ORAL_CAPSULE | Freq: Two times a day (BID) | ORAL | Status: DC
  Administered 2013-07-06: 3 mg via ORAL
  Filled 2013-07-05 (×2): qty 3

## 2013-07-05 MED ORDER — FAMOTIDINE 20 MG PO TABS
20.0000 mg | ORAL_TABLET | Freq: Every day | ORAL | Status: DC
  Filled 2013-07-05: qty 1

## 2013-07-05 MED ORDER — SODIUM CHLORIDE 0.9 % IV SOLN
INTRAVENOUS | Status: DC
  Administered 2013-07-05: 15:00:00 via INTRAVENOUS

## 2013-07-05 MED ORDER — ENOXAPARIN SODIUM 80 MG/0.8ML ~~LOC~~ SOLN
75.0000 mg | SUBCUTANEOUS | Status: DC
  Administered 2013-07-05 – 2013-07-06 (×2): 75 mg via SUBCUTANEOUS
  Filled 2013-07-05 (×3): qty 0.8

## 2013-07-05 MED ORDER — WARFARIN SODIUM 4 MG PO TABS
4.0000 mg | ORAL_TABLET | Freq: Once | ORAL | Status: AC
  Administered 2013-07-05: 4 mg via ORAL
  Filled 2013-07-05: qty 1

## 2013-07-05 NOTE — Evaluation (Signed)
Physical Therapy Evaluation Patient Details Name: ABRIA VANNOSTRAND MRN: [id number] DOB: 03/31/1950 Today's Date: 07/05/2013   History of Present Illness  Makell Drohan  is a 63 y.o. female, renal transplant with chronic kidney disease stage IV baseline creatinine around 1.8, atrial fibrillation on Coumadin, hypertension, glaucoma, Esophageal stricture who presents to the ER with 7-10 day history of intermittent left-sided chest pressure   Clinical Impression  Pt admitted with chest pressure which was not recreated with activity however, pt with significant balance impairment as well as weakness left hip. Pt currently with functional limitations due to the deficits listed below (see PT Problem List). Pt will benefit from skilled PT to increase their independence and safety with mobility to allow discharge home with HHPT/ OT. PT will continue to follow.      Follow Up Recommendations Home health PT;Supervision - Intermittent    Equipment Recommendations  None recommended by PT    Recommendations for Other Services OT consult     Precautions / Restrictions Precautions Precautions: Fall Precaution Comments: pt has several falls in past few years Restrictions Weight Bearing Restrictions: No      Mobility  Bed Mobility               General bed mobility comments: pt up in chair  Transfers Overall transfer level: Modified independent Equipment used: None                Ambulation/Gait Ambulation/Gait assistance: Min guard Ambulation Distance (Feet): 200 Feet Assistive device: None Gait Pattern/deviations: Trendelenburg;Decreased stance time - left Gait velocity: decreased   General Gait Details: increased sway and occasional staggering gait, significant fall risk, educated pt on use of cane to increase safety but pt resistant  Stairs Stairs: Yes Stairs assistance: Min assist Stair Management: One rail Right;Forwards;Step to pattern Number of Stairs: 2 General  stair comments: min A, unsteady  Wheelchair Mobility    Modified Rankin (Stroke Patients Only)       Balance Overall balance assessment: Needs assistance Sitting-balance support: No upper extremity supported Sitting balance-Leahy Scale: Normal     Standing balance support: No upper extremity supported Standing balance-Leahy Scale: Fair Standing balance comment: decreased baalnce reactions                             Pertinent Vitals/Pain No c/o pain    Home Living Family/patient expects to be discharged to:: Private residence Living Arrangements: Spouse/significant other Available Help at Discharge: Family;Available 24 hours/day Type of Home: House Home Access: Stairs to enter   [employer] of Steps: 3 Home Layout: One level Home Equipment: Cane - single point Additional Comments: pt reports she uses cane to go to MD but otherwise does not want to use it    Prior Function Level of Independence: Independent         Comments: pt drives occasionally but admits family does not want her to. Daughter reports her balance has been off past 2 years     Hand Dominance        Extremity/Trunk Assessment   Upper Extremity Assessment: RUE deficits/detail;LUE deficits/detail RUE Deficits / Details: decreased ROM hand, fingers shiny, cold   RUE Sensation: decreased light touch LUE Deficits / Details: no active shoulder flexion, suspect RTC tear, full PROM using RUE, fingers also cold and shiny with decreased motion   Lower Extremity Assessment: LLE deficits/detail   LLE Deficits / Details: hip weakness, trendelenberg sign, quad [facility]  Cervical / Trunk Assessment: Normal  Communication   Communication: No difficulties  Cognition Arousal/Alertness: Awake/alert Behavior During Therapy: WFL for tasks assessed/performed Overall Cognitive Status: Within Functional Limits for tasks assessed                      General Comments       Exercises        Assessment/Plan    PT Assessment Patient needs continued PT services  PT Diagnosis Difficulty walking;Abnormality of gait   PT Problem List Decreased strength;Decreased range of motion;Decreased activity tolerance;Decreased balance;Decreased mobility;Decreased knowledge of use of DME;Decreased coordination;Decreased knowledge of precautions  PT Treatment Interventions DME instruction;Gait training;Stair training;Functional mobility training;Therapeutic activities;Therapeutic exercise;Balance training;Patient/family education   PT Goals (Current goals can be found in the Care Plan section) Acute Rehab PT Goals Patient Stated Goal: go home PT Goal Formulation: With patient Time For Goal Achievement: 07/19/13 Potential to Achieve Goals: Good    Frequency Min 3X/week   Barriers to discharge        Co-evaluation               End of Session Equipment Utilized During Treatment: Gait belt Activity Tolerance: Patient tolerated treatment well Patient left: in chair;with call bell/phone within reach;with family/visitor present Nurse Communication: Mobility status         Time: 5956-3875 PT Time Calculation (min): 30 min   Charges:   PT Evaluation $Initial PT Evaluation Tier I: 1 Procedure PT Treatments $Gait Training: 8-22 mins $Therapeutic Activity: 8-22 mins   PT G Codes:        Leighton Roach, PT  Acute Rehab Services  [place], [place] 07/05/2013, 4:38 PM

## 2013-07-05 NOTE — Progress Notes (Signed)
Subjective:  Somewhat difficult to evaluate but no recurrent ischemic pain overnight.  Objective:  Vital Signs in the last 24 hours: BP 145/61  Pulse 71  Temp(Src) 97.8 F (36.6 C) (Axillary)  Resp 18  Ht 5\' 4"  (1.626 m)  Wt 74.662 kg (164 lb 9.6 oz)  BMI 28.24 kg/m2  SpO2 99%  Physical Exam: Pleasant mildly obese black female in no acute distress  Lungs:  Clear Cardiac:  Regular rhythm, normal S1 and S2, no S3, 8-6/7 systolic murmur Extremities:  No edema present  Intake/Output from previous day: 06/13 0701 - 06/14 0700 In: 467 [P.O.:300; I.V.:167] Out: -   Weight Filed Weights   07/03/13 0528 07/05/13 0500  Weight: 74.844 kg (165 lb) 74.662 kg (164 lb 9.6 oz)    Lab Results: Basic Metabolic Panel:  Recent Labs  07/04/13 0345 07/05/13 0425  NA 137 140  K 4.5 4.5  CL 101 101  CO2 22 21  GLUCOSE 95 104*  BUN 29* 35*  CREATININE 2.04* 2.21*    Telemetry: Sinus rhythm  Assessment/Plan:  1. Chest discomfort has improved 2. Atrial fibrillation 3. Hypertension 4. Prior renal transplant with chronic kidney disease  Recommendations:  Have cardiac rehabilitation see her. Ambulate again today to be sure that she is stable.   Kerry Hough  MD [facility]  07/05/2013, 12:16 PM

## 2013-07-05 NOTE — Progress Notes (Signed)
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  Objective: Vital signs in last 24 hours: Temp:  [97.8 F (36.6 C)] 97.8 F (36.6 C) (06/14 1354) Pulse Rate:  [70-71] 70 (06/14 1354) Resp:  [17-18] 17 (06/14 1354) BP: (134-145)/(61-63) 134/63 mmHg (06/14 1354) SpO2:  [99 %-100 %] 100 % (06/14 1354) Weight:  [74.662 kg (164 lb 9.6 oz)] 74.662 kg (164 lb 9.6 oz) (06/14 0500) Weight change:   Intake/Output from previous day: 06/13 0701 - 06/14 0700 In: 467 [P.O.:300; I.V.:167] Out: -  Intake/Output this shift: Total I/O In: 103 [P.O.:100; I.V.:3] Out: -   General appearance: alert and cooperative Resp: clear to auscultation bilaterally Chest wall: no tenderness Cardio: regular rate and rhythm, S1, S2 normal, no murmur, click, rub or gallop Extremities: extremities normal, atraumatic, no cyanosis or edema  Lab Results:  Recent Labs  07/04/13 1630  WBC 5.7  HGB 10.6*  HCT 32.5*  PLT 158   BMET:  Recent Labs  07/04/13 0345 07/05/13 0425  NA 137 140  K 4.5 4.5  CL 101 101  CO2 22 21  GLUCOSE 95 104*  BUN 29* 35*  CREATININE 2.04* 2.21*  CALCIUM 9.8 9.8   No results found for this basename: PTH,  in the last 72 hours Iron Studies: No results found for this basename: IRON, TIBC, TRANSFERRIN, FERRITIN,  in the last 72 hours Studies/Results: No results found.  Scheduled: . aspirin EC  81 mg Oral Daily  . bimatoprost  1 drop Both Eyes QHS  . brimonidine  1 drop Both Eyes TID  . calcitRIOL  0.25 mcg Oral QODAY  . calcitRIOL  0.5 mcg Oral  QODAY  . enoxaparin (LOVENOX) injection  75 mg Subcutaneous Q24H  . [START ON 07/06/2013] famotidine  20 mg Oral Daily  . isosorbide mononitrate  30 mg Oral Daily  . labetalol  200 mg Oral Daily  . mycophenolate  180 mg Oral BID  . ranolazine  500 mg Oral BID  . sodium bicarbonate  650 mg Oral BID  . sodium chloride  3 mL Intravenous Q12H  . [START ON 07/06/2013] tacrolimus  3 mg Oral BID  . timolol  1 drop Both Eyes BID  . warfarin  4 mg Oral ONCE-1800  . Warfarin - Pharmacist Dosing Inpatient   Does not apply q1800     LOS: 5 days   Livingston Denner C 07/05/2013,2:35 PM

## 2013-07-05 NOTE — Progress Notes (Signed)
[facility] PROGRESS NOTE  Debbie [name] JEH:[id number] DOB: Mar 18, 1950 DOA: 06/30/2013 PCP: Geoffry Paradise, MD  Assessment/Plan: 1.Chest pain.  - Follow up serial cardiac enzymes neg - Cardiology consulted and are following - Stress test on 6/11 was found to be abnormal - Symptoms improved - Medication changes per Cardiology noted - Cardiology recs for Cardiac rehab - pending eval 2. 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 Creatinine appears to be near baseline. Continued Prograf, sodium bicarbonate and Lasix along with calcitriol.  - Nephrology following 3. Hypertension. We'll continue home medications which include beta blocker and low-dose Lasix.  4. Glaucoma. No acute issues eyedrops at home dose.  5. History of atrial fibrillation. Cont beta blocker and coumadin  Code Status: Full Family Communication: Pt in room Disposition Plan: Pending   Consultants:  Cardiology  Cardiac Rehab  Procedures:    Antibiotics:    HPI/Subjective: No complaints. Eager to go home  Objective: Filed Vitals:   07/04/13 0514 07/04/13 1303 07/04/13 2100 07/05/13 0500  BP: 155/61 144/64 145/61 145/61  Pulse: 70 66 71 71  Temp: 98.1 F (36.7 C) 98.1 F (36.7 C) 97.8 F (36.6 C) 97.8 F (36.6 C)  TempSrc: Oral Axillary    Resp: 15 17 18 18   Height:      Weight:    164 lb 9.6 oz (74.662 kg)  SpO2: 100% 100% 99% 99%    Intake/Output Summary (Last 24 hours) at 07/05/13 1227 Last data filed at 07/05/13 1023  Gross per 24 hour  Intake    407 ml  Output      0 ml  Net    407 ml   Filed Weights   07/03/13 0528 07/05/13 0500  Weight: 165 lb (74.844 kg) 164 lb 9.6 oz (74.662 kg)    Exam:   General:  Awake, in nad  Cardiovascular: regular,s 1, s2  Respiratory: normal resp effort, no wheezing  Abdomen: soft,nondistended  Musculoskeletal: perfused, no clubbing   Data Reviewed: Basic Metabolic Panel:  Recent Labs Lab 07/01/13 0424 07/02/13 0509  07/03/13 0330 07/04/13 0345 07/05/13 0425  NA 141 138 138 137 140  K 4.3 4.5 4.6 4.5 4.5  CL 105 100 101 101 101  CO2 19 21 21 22 21   GLUCOSE 118* 93 94 95 104*  BUN 26* 29* 28* 29* 35*  CREATININE 1.88* 1.98* 1.92* 2.04* 2.21*  CALCIUM 9.7 10.0 9.8 9.8 9.8   Liver Function Tests: No results found for this basename: AST, ALT, ALKPHOS, BILITOT, PROT, ALBUMIN,  in the last 168 hours No results found for this basename: LIPASE, AMYLASE,  in the last 168 hours No results found for this basename: AMMONIA,  in the last 168 hours CBC:  Recent Labs Lab 06/30/13 1450 07/04/13 1630  WBC 5.0 5.7  HGB 10.5* 10.6*  HCT 32.1* 32.5*  MCV 83.2 83.5  PLT 151 158   Cardiac Enzymes:  Recent Labs Lab 06/30/13 1601 06/30/13 2236 07/01/13 0424  TROPONINI <0.30 <0.30 <0.30   BNP (last 3 results) No results found for this basename: PROBNP,  in the last 8760 hours CBG: No results found for this basename: GLUCAP,  in the last 168 hours  No results found for this or any previous visit (from the past 240 hour(s)).   Studies: No results found.  Scheduled Meds: . aspirin EC  81 mg Oral Daily  . bimatoprost  1 drop Both Eyes QHS  . brimonidine  1 drop Both Eyes TID  . calcitRIOL  0.25 mcg Oral QODAY  . calcitRIOL  0.5 mcg Oral QODAY  . enoxaparin (LOVENOX) injection  75 mg Subcutaneous Q24H  . famotidine  40 mg Oral Daily  . furosemide  20 mg Oral Daily  . isosorbide mononitrate  30 mg Oral Daily  . labetalol  200 mg Oral Daily  . mycophenolate  180 mg Oral BID  . ranolazine  500 mg Oral BID  . sodium bicarbonate  650 mg Oral BID  . sodium chloride  3 mL Intravenous Q12H  . tacrolimus  4 mg Oral Daily   And  . tacrolimus  3 mg Oral q1800  . timolol  1 drop Both Eyes BID  . warfarin  4 mg Oral ONCE-1800  . Warfarin - Pharmacist Dosing Inpatient   Does not apply q1800   Continuous Infusions: . sodium chloride Stopped (07/04/13 1900)    Principal Problem:   Chest pain Active  Problems:   Kidney transplant as cause of abnormal reaction or later complication   CKD (chronic kidney disease), stage III   Atrial fibrillation   HTN (hypertension)  Time spent: 39min  Georgette Helmer, [facility] Pager [phone]. If 7PM-7AM, please contact night-coverage at [email or web address], password [facility] 07/05/2013, 12:27 PM  LOS: 5 days

## 2013-07-05 NOTE — Progress Notes (Signed)
ANTICOAGULATION CONSULT NOTE - Follow-Up Consult  Pharmacy Consult for Lovenox + Warfarin Indication: Hx Afib/antiphospholipid syndrome  Allergies  Allergen Reactions  . Gentamycin [Gentamicin Sulfate] Itching and Swelling  . Penicillins Itching  . Vancomycin Itching and Swelling    Patient Measurements: Height: 5\' 4"  (162.6 cm) Weight: 164 lb 9.6 oz (74.662 kg) IBW/kg (Calculated) : 54.7 Heparin Dosing Weight: 70.3 kg  Vital Signs: Temp: 97.8 F (36.6 C) (06/14 0500) BP: 145/61 mmHg (06/14 0500) Pulse Rate: 71 (06/14 0500)  Labs:  Recent Labs  07/03/13 0330 07/04/13 0345 07/04/13 1630 07/05/13 0425  HGB  --   --  10.6*  --   HCT  --   --  32.5*  --   PLT  --   --  158  --   LABPROT 24.1* 20.5*  --  17.5*  INR 2.24* 1.82*  --  1.48  HEPARINUNFRC  --   --  0.60  --   CREATININE 1.92* 2.04*  --  2.21*    Estimated Creatinine Clearance: 25.5 ml/min (by C-G formula based on Cr of 2.21).   Assessment: 1 YOF on warfarin PTA for hx Afib/antiphospholipid syndrome held on 6/13 while awaiting cath plans per cardiology after the patient had an abnormal myoview on 6/11. Heparin started for bridging on 6/13 AM as INR <2, warfarin resumed as cardiology not planning to cath. Heparin stopped 6/13 evening, unclear reasoning - discussed with Dr. Wyline Copas this morning and resuming lovenox for bridging while INR remains <2 with hx of antiphospholipid syndrome.    INR this morning remains SUBtherapeutic and continues to trend down despite the higher dose of warfarin given yesterday evening (INR 1.48 << 1.85, goal of 2-3). Wt: 74.7 kg, SCr 2.21, CrCl~25-30 ml/min.   Goal of Therapy:  INR 2-3 Anti-Xa level 0.6-1 units/ml 4hrs after LMWH dose given Monitor platelets by anticoagulation protocol: Yes   Plan:  1. Initiate Lovenox 75 mg SQ every 24 hours 2. Warfarin 4 mg x 1 dose at 1800 today 3. Will continue to monitor for any signs/symptoms of bleeding and will follow up with PT/INR in  the a.m.   Alycia Rossetti, PharmD, BCPS Clinical Pharmacist Pager: [phone] 07/05/2013 8:02 AM

## 2013-07-06 DIAGNOSIS — Z7901 Long term (current) use of anticoagulants: Secondary | ICD-10-CM

## 2013-07-06 LAB — BASIC METABOLIC PANEL
BUN: 32 mg/dL — AB (ref 6–23)
CO2: 18 meq/L — AB (ref 19–32)
CREATININE: 2.05 mg/dL — AB (ref 0.50–1.10)
Calcium: 10.2 mg/dL (ref 8.4–10.5)
Chloride: 100 mEq/L (ref 96–112)
GFR calc Af Amer: 28 mL/min — ABNORMAL LOW (ref >90)
GFR calc non Af Amer: 24 mL/min — ABNORMAL LOW (ref >90)
Glucose, Bld: 94 mg/dL (ref 70–99)
Potassium: 5.1 mEq/L (ref 3.7–5.3)
Sodium: 136 mEq/L — ABNORMAL LOW (ref 137–147)

## 2013-07-06 LAB — PROTIME-INR
INR: 1.89 — ABNORMAL HIGH (ref 0.00–1.49)
Prothrombin Time: 21.1 seconds — ABNORMAL HIGH (ref 11.6–15.2)

## 2013-07-06 MED ORDER — ISOSORBIDE MONONITRATE ER 30 MG PO TB24: 30.0000 mg | ORAL_TABLET | Freq: Every day | ORAL | Status: DC

## 2013-07-06 MED ORDER — LABETALOL HCL 200 MG PO TABS
200.0000 mg | ORAL_TABLET | Freq: Two times a day (BID) | ORAL | Status: DC
  Administered 2013-07-06: 200 mg via ORAL
  Filled 2013-07-06 (×2): qty 1

## 2013-07-06 MED ORDER — ISOSORBIDE MONONITRATE ER 60 MG PO TB24: 60.0000 mg | ORAL_TABLET | Freq: Every day | ORAL | Status: DC

## 2013-07-06 MED ORDER — RANOLAZINE ER 1000 MG PO TB12: 1000.0000 mg | ORAL_TABLET | Freq: Two times a day (BID) | ORAL | Status: DC

## 2013-07-06 MED ORDER — ENOXAPARIN SODIUM 80 MG/0.8ML ~~LOC~~ SOLN: 75.0000 mg | SUBCUTANEOUS | Status: DC

## 2013-07-06 MED ORDER — AMLODIPINE BESYLATE 2.5 MG PO TABS: 2.5000 mg | ORAL_TABLET | Freq: Every day | ORAL | Status: DC

## 2013-07-06 MED ORDER — RANOLAZINE ER 500 MG PO TB12
1000.0000 mg | ORAL_TABLET | Freq: Two times a day (BID) | ORAL | Status: DC
  Administered 2013-07-06: 1000 mg via ORAL
  Filled 2013-07-06 (×2): qty 2

## 2013-07-06 MED ORDER — FAMOTIDINE 40 MG PO TABS
40.0000 mg | ORAL_TABLET | Freq: Every day | ORAL | Status: DC
  Administered 2013-07-06: 40 mg via ORAL
  Filled 2013-07-06: qty 1

## 2013-07-06 MED ORDER — WARFARIN SODIUM 3 MG PO TABS
3.0000 mg | ORAL_TABLET | Freq: Once | ORAL | Status: DC
  Filled 2013-07-06: qty 1

## 2013-07-06 MED ORDER — ISOSORBIDE MONONITRATE ER 30 MG PO TB24
30.0000 mg | ORAL_TABLET | Freq: Every day | ORAL | Status: DC
  Administered 2013-07-06: 30 mg via ORAL
  Filled 2013-07-06: qty 1

## 2013-07-06 NOTE — Progress Notes (Signed)
Occupational Therapy Evaluation Patient Details Name: Debbie [name] MRN: [id number] DOB: 05-24-1950 Today's Date: 07/06/2013    History of Present Illness [name]  is a 63 y.o. female, renal transplant with chronic kidney disease stage IV baseline creatinine around 1.8, atrial fibrillation on Coumadin, hypertension, glaucoma, Esophageal stricture who presents to the ER with 7-10 day history of intermittent left-sided chest pressure    Clinical Impression   Patient evaluated by Occupational Therapy with no further acute OT needs identified. All education has been completed and the patient has no further questions. See below for any follow-up Occupational Therapy or equipment needs. OT to sign off. Thank you for referral.      Follow Up Recommendations  No OT follow up    Equipment Recommendations  None recommended by OT    Recommendations for Other Services       Precautions / Restrictions Precautions Precautions: Fall Precaution Comments: pt has several falls in past few years      Mobility Bed Mobility               General bed mobility comments:  (not observed at sink on arrival)  Transfers Overall transfer level: Modified independent                    Balance                                            ADL Overall ADL's : At baseline                                       General ADL Comments: Pt at sink brushing teeth and dentures on arrival. pt completed full morning adl at sink level. Pt reports no concerns for home. pt has family assistance and eager to d/c home     Vision                     Perception     Praxis      Pertinent Vitals/Pain VSS     Hand Dominance Right   Extremity/Trunk Assessment Upper Extremity Assessment Upper Extremity Assessment: LUE deficits/detail LUE Deficits / Details: No AROM shoulder flexion, abduction ~15 degrees. Pt AROM WFL for elbow flexion, internal  adduction. Pt reports injury more than 63 years old and occurred during HD treatment   Lower Extremity Assessment Lower Extremity Assessment: Defer to PT evaluation   Cervical / Trunk Assessment Cervical / Trunk Assessment: Normal   Communication Communication Communication: No difficulties   Cognition Arousal/Alertness: Awake/alert Behavior During Therapy: WFL for tasks assessed/performed Overall Cognitive Status: Within Functional Limits for tasks assessed                     General Comments       Exercises       Shoulder Instructions      Home Living Family/patient expects to be discharged to:: Private residence Living Arrangements: Spouse/significant other Available Help at Discharge: Family;Available 24 hours/day Type of Home: House Home Access: Stairs to enter [employer] of Steps: 3   Home Layout: One level     Bathroom Shower/Tub: Other (comment) (sponge)   Bathroom Toilet: Standard     Home Equipment: Cane - single point   Additional  Comments: pt reports she uses cane to go to MD but otherwise does not want to use it      Prior Functioning/Environment Level of Independence: Independent        Comments: pt drives occasionally but admits family does not want her to. Daughter reports her balance has been off past 2 years    OT Diagnosis:     OT Problem List:     OT Treatment/Interventions:      OT Goals(Current goals can be found in the care plan section) Acute Rehab OT Goals Patient Stated Goal: go home  OT Frequency:     Barriers to D/C:            Co-evaluation              End of Session Nurse Communication: Mobility status  Activity Tolerance: Patient tolerated treatment well Patient left: in chair;with call bell/phone within reach   Time: 5093-2671 OT Time Calculation (min): 11 min Charges:  OT General Charges $OT Visit: 1 Procedure G-Codes:    Peri Maris July 16, 2013, 2:22 PM Pager:  [phone]

## 2013-07-06 NOTE — Discharge Summary (Addendum)
Physician Discharge Summary  Debbie [name]:[id number] DOB: April 29, 1950 DOA: 06/30/2013  PCP: Placido Sou, MD  Admit date: 06/30/2013 Discharge date: 07/06/2013  Time spent: 35 minutes  Recommendations for Outpatient Follow-up:  1. Follow up with PCP in 1-2 weeks 2. Follow up with Cardiology as scheduled 3. Please repeat INR within 3 days. INR on day of discharge is 1.89. She is being bridged with lovenox until therapeutic  Discharge Diagnoses:  Principal Problem:   Angina pectoris Active Problems:   Kidney transplant as cause of abnormal reaction or later complication   CKD (chronic kidney disease), stage III   Atrial fibrillation   HTN (hypertension)   Chronic anticoagulation - with Coumadin   Discharge Condition: Stable  Diet recommendation: Renal  Filed Weights   07/03/13 0528 07/05/13 0500 07/06/13 0500  Weight: 165 lb (74.844 kg) 164 lb 9.6 oz (74.662 kg) 163 lb 1.6 oz (73.982 kg)    History of present illness:  Debbie [name] is a 63 y.o. 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 Came to the ER where EKG was unremarkable, I stat troponin was mildly elevated, was called to admit the patient for chest pain evaluation and for possible NSTEMI. Patient is currently chest pain and symptom-free.   Hospital Course:  1.Chest pain.  - Follow up serial cardiac enzymes neg  - Cardiology consulted - Stress test on 6/11 was found to be  abnormal  - Given risk for renal failure, decision was made to focus on medical mgt - Medication changes per Cardiology noted - see discharge med list - Cardiology recs for Cardiac rehab with PT recommending home health/PT 2. 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 Creatinine appears to be near baseline. Continued Prograf, sodium bicarbonate and Lasix along with calcitriol.  - Nephrology following  3. Hypertension. Continued home medications which include beta blocker and low-dose Lasix.   4. Glaucoma. No acute issues eyedrops at home dose.  5. History of atrial fibrillation. Cont beta blocker and coumadin  Procedures:  Abnormal stress test 6/11  Consultations:  Nephrology  Cardiology  Discharge Exam: Filed Vitals:   07/05/13 1354 07/05/13 2100 07/06/13 0500 07/06/13 1000  BP: 134/63 163/62 153/61 139/65  Pulse: 70 71 71 71  Temp: 97.8 F (36.6 C) 97.8 F (36.6 C) 98 F (36.7 C)   TempSrc: Oral     Resp: 17 18 18    Height:      Weight:   163 lb 1.6 oz (73.982 kg)   SpO2: 100% 98% 96%     General: Awake, in nad Cardiovascular: regular, s1, s2 Respiratory: normal resp effort, no wheezing  Discharge Instructions     Medication List         ALPHAGAN P 0.1 % Soln  Generic drug:  brimonidine  Place 1 drop into both eyes at bedtime.     aspirin EC 81 MG tablet  Take 81 mg by mouth daily.     calcitRIOL 0.25 MCG capsule  Commonly known as:  ROCALTROL  Take  0.25-0.5 mcg by mouth daily. Take 0.25 mcg daily alternating with 0.5 mcg     enoxaparin 80 MG/0.8ML injection  Commonly known as:  LOVENOX  Inject 0.75 mLs (75 mg total) into the skin daily.     furosemide 20 MG tablet  Commonly known as:  LASIX  Take 20 mg by mouth daily as needed. Excess edema     isosorbide mononitrate 30 MG 24 hr tablet  Commonly known as:  IMDUR  Take 1 tablet (30 mg total) by mouth daily.     labetalol 200 MG tablet  Commonly known as:  NORMODYNE  Take 200 mg by  mouth daily.     LUMIGAN 0.01 % Soln  Generic drug:  bimatoprost  Place 1 drop into both eyes at bedtime.     mycophenolate 180 MG EC tablet  Commonly known as:  MYFORTIC  Take 180 mg by mouth 2 (two) times daily.     ranitidine 150 MG tablet  Commonly known as:  ZANTAC  Take 150 mg by mouth daily.     ranolazine 1000 MG SR tablet  Commonly known as:  RANEXA  Take 1 tablet (1,000 mg total) by mouth 2 (two) times daily.     sodium bicarbonate 650 MG tablet  Take 650 mg by mouth 2 (two) times daily.     tacrolimus 1 MG capsule  Commonly known as:  PROGRAF  Take 3-4 mg by mouth See admin instructions. 4 caps in am and 3 caps in pm.     timolol 0.5 % ophthalmic solution  Commonly known as:  BETIMOL  Place 1 drop into both eyes 2 (two) times daily.     warfarin 2.5 MG tablet  Commonly known as:  COUMADIN  Take 2.5 mg by mouth every evening.       Allergies  Allergen Reactions  . Gentamycin [Gentamicin Sulfate] Itching and Swelling  . Penicillins Itching  . Vancomycin Itching and Swelling       Follow-up Information   Follow up with Geoffry Paradise, MD. Schedule an appointment as soon as possible for a visit in 1 week.   Specialty:  Nephrology   Contact information:   [address] [place] [address] [phone]       Follow up with Follow up with your Heart Doctors as scheduled.       The results of significant diagnostics from this hospitalization (including imaging, microbiology, ancillary and laboratory) are listed below for reference.    Significant Diagnostic Studies: Dg Chest 2 View  06/30/2013   CLINICAL DATA:  Left-sided chest pain.  EXAM: CHEST  2 VIEW  COMPARISON:  01/07/2013 and 04/17/2011  FINDINGS: Heart size is normal. There is unchanged chronic scarring in the left hemithorax with chronic prominence of the left main pulmonary artery. There is chronic blunting of the left costophrenic angle.  Right lung is clear. There  is extensive coronary calcification visible on the lateral view. No acute osseous abnormality. No effusions.  IMPRESSION: No acute abnormalities.   Electronically Signed   By: Rozetta Nunnery M.D.   On: 06/30/2013 13:42   Nm Myocar Multi W/spect W/wall Motion / Ef  07/02/2013   CLINICAL DATA:  Patient is a 63 yo with history of chest pain. Test to evaluate/rule out ischemia.  EXAM: MYOCARDIAL IMAGING WITH SPECT (REST AND PHARMACOLOGIC-STRESS)  GATED LEFT VENTRICULAR WALL MOTION STUDY  LEFT VENTRICULAR EJECTION FRACTION  TECHNIQUE: Standard myocardial SPECT imaging was performed after resting intravenous injection  of 10 mCi Tc-67m sestamibi. Subsequently, intravenous infusion of Lexiscan was performed under the supervision of the Cardiology staff. At peak effect of the drug, 30 mCi Tc-74m sestamibi was injected intravenously and standard myocardial SPECT imaging was performed. Quantitative gated imaging was also performed to evaluate left ventricular wall motion, and estimate left ventricular ejection fraction.  COMPARISON:  None.  FINDINGS: Stress data: Baseline EKG: SR 67 bpm Nonspecific ST T wave changes. With infusion of lexiscan there was ST depression (0.5 to 1 mm) in the inferior and lateral leads. Incomplete normalization in recovery.  Nuclear data: In the initial stress images there was decreased tracer activity with a large defect in the inferior wall (base, mid, distal) inferolateral wall (base, mid, distal) distal anterior and apical wall.  In the recovery images there was incomplete normalization inf the distal anteiro and apex.  On gating, LVEF was calculated at 54% with normal wall motion. Review of the raw data shows soft tissue (diaphragm) underlying the heart.  IMPRESSION: Lexiscan myoview: Electrically positive. Myoview scan with evidence of subendocardial scar and possible soft tissue attenuation in the inferior/inferolateral distal anterior and apical walls. with mild distal anterior/apical  ischemia. LVEF 54%   Electronically Signed   By: Dorris Carnes M.D.   On: 07/02/2013 16:44    Microbiology: No results found for this or any previous visit (from the past 240 hour(s)).   Labs: Basic Metabolic Panel:  Recent Labs Lab 07/02/13 0509 07/03/13 0330 07/04/13 0345 07/05/13 0425 07/06/13 0505  NA 138 138 137 140 136*  K 4.5 4.6 4.5 4.5 5.1  CL 100 101 101 101 100  CO2 21 21 22 21  18*  GLUCOSE 93 94 95 104* 94  BUN 29* 28* 29* 35* 32*  CREATININE 1.98* 1.92* 2.04* 2.21* 2.05*  CALCIUM 10.0 9.8 9.8 9.8 10.2   Liver Function Tests: No results found for this basename: AST, ALT, ALKPHOS, BILITOT, PROT, ALBUMIN,  in the last 168 hours No results found for this basename: LIPASE, AMYLASE,  in the last 168 hours No results found for this basename: AMMONIA,  in the last 168 hours CBC:  Recent Labs Lab 06/30/13 1450 07/04/13 1630  WBC 5.0 5.7  HGB 10.5* 10.6*  HCT 32.1* 32.5*  MCV 83.2 83.5  PLT 151 158   Cardiac Enzymes:  Recent Labs Lab 06/30/13 1601 06/30/13 2236 07/01/13 0424  TROPONINI <0.30 <0.30 <0.30   BNP: BNP (last 3 results) No results found for this basename: PROBNP,  in the last 8760 hours CBG: No results found for this basename: GLUCAP,  in the last 168 hours     Signed:  Amenah Tucci K  [facility] 07/06/2013, 1:19 PM

## 2013-07-06 NOTE — Progress Notes (Signed)
Patient Name: Debbie [name] Date of Encounter: 07/06/2013  Principal Problem:   Chest pain Active Problems:   Kidney transplant as cause of abnormal reaction or later complication   CKD (chronic kidney disease), stage III   Atrial fibrillation   HTN (hypertension)    Patient Profile: 63 yo female w/ hx renal tx, PAF, chronic coumadin, CKD stage IV, anti-phospholipid antibody syndrome, HTN but no CAD was admitted 06/09 for chest pain. MV on 06/11 was abnormal.  SUBJECTIVE: Had indigestion, at least twice today, says it comes and goes. Has it with and without exertion. Says no cath. Says has pedal edema (none noted on exam).  OBJECTIVE Filed Vitals:   07/05/13 0500 07/05/13 1354 07/05/13 2100 07/06/13 0500  BP: 145/61 134/63 163/62 153/61  Pulse: 71 70 71 71  Temp: 97.8 F (36.6 C) 97.8 F (36.6 C) 97.8 F (36.6 C) 98 F (36.7 C)  TempSrc:  Oral    Resp: 18 17 18 18   Height:      Weight: 164 lb 9.6 oz (74.662 kg)   163 lb 1.6 oz (73.982 kg)  SpO2: 99% 100% 98% 96%    Intake/Output Summary (Last 24 hours) at 07/06/13 0859 Last data filed at 07/05/13 1740  Gross per 24 hour  Intake    343 ml  Output      0 ml  Net    343 ml   Filed Weights   07/03/13 0528 07/05/13 0500 07/06/13 0500  Weight: 165 lb (74.844 kg) 164 lb 9.6 oz (74.662 kg) 163 lb 1.6 oz (73.982 kg)    PHYSICAL EXAM General: Well developed, well nourished, female in no acute distress. Head: Normocephalic, atraumatic.  Neck: Supple without bruits, JVD not elevated. Lungs:  Resp regular and unlabored, CTA. Heart: RRR, S1, S2, no S3, S4, systolic murmur; no rub. Abdomen: Soft, non-tender, non-distended, BS + x 4.  Extremities: No clubbing, cyanosis, no edema.  Neuro: Alert and oriented X 3. Moves all extremities spontaneously. Psych: Normal affect.  LABS: CBC: Recent Labs  07/04/13 1630  WBC 5.7  HGB 10.6*  HCT 32.5*  MCV 83.5  PLT 158   INR: Recent Labs  07/06/13 0505  INR 1.89*    Basic Metabolic Panel: Recent Labs  07/05/13 0425 07/06/13 0505  NA 140 136*  K 4.5 5.1  CL 101 100  CO2 21 18*  GLUCOSE 104* 94  BUN 35* 32*  CREATININE 2.21* 2.05*  CALCIUM 9.8 10.2   BNP: Pro B Natriuretic peptide (BNP)  Date/Time Value Ref Range Status  04/17/2011  9:34 PM 3723.0* 0 - 125 pg/mL Final  12/23/2008 12:40 PM 1206.0* 0.0 - 100.0 pg/mL Final    TELE:     Current Medications:  . aspirin EC  81 mg Oral Daily  . bimatoprost  1 drop Both Eyes QHS  . brimonidine  1 drop Both Eyes TID  . calcitRIOL  0.25 mcg Oral QODAY  . calcitRIOL  0.5 mcg Oral QODAY  . enoxaparin (LOVENOX) injection  75 mg Subcutaneous Q24H  . famotidine  20 mg Oral Daily  . isosorbide mononitrate  30 mg Oral Daily  . labetalol  200 mg Oral Daily  . mycophenolate  180 mg Oral BID  . ranolazine  500 mg Oral BID  . sodium bicarbonate  650 mg Oral BID  . sodium chloride  3 mL Intravenous Q12H  . tacrolimus  3 mg Oral BID  . timolol  1 drop Both Eyes BID  .  Warfarin - Pharmacist Dosing Inpatient   Does not apply q1800   . sodium chloride 100 mL/hr at 07/05/13 1445    ASSESSMENT AND PLAN: Principal Problem:   Angina pectoris - ez neg MI, MV 06/11 was abnormal. Pt does not want cath, medical Rx. Will increase Ranexa to 1000mg  BID, continue low dose Imdur (she did not tolerate a dose higher than 30mg  daily). On ASA, BB, will add Pravachol 20 mg daily (could use Lipitor or Crestor but NOT simvastatin or lovastatin). May also be having some GERD sx so will increase Pepcid to 40 mg daily.  Active Problems:   Kidney transplant as cause of abnormal reaction or later complication - per IM/Renal    CKD (chronic kidney disease), stage III - per IM/Renal    Atrial fibrillation - maintaining SR    HTN (hypertension) - will increase labetalol to BID    Chronic anticoagulation - continue coumadin per pharmacy.  Otherwise, per IM Signed, Rosaria Ferries , PA-C  Patient seen and examined and  agree with note as outlined above by Rosaria Ferries PA-C with minor changes.  Will increase beta blocker which should help with CP (if cardiac) as well as BP.  She does not tolerate a higher dose of Imdur due to HA.  Will increase Ranexa to 1000mg  BID.   8:59 AM 07/06/2013

## 2013-07-06 NOTE — Progress Notes (Signed)
ANTICOAGULATION CONSULT NOTE - Follow-Up Consult  Pharmacy Consult for Lovenox + Warfarin Indication: Hx Afib/antiphospholipid syndrome  Allergies  Allergen Reactions  . Gentamycin [Gentamicin Sulfate] Itching and Swelling  . Penicillins Itching  . Vancomycin Itching and Swelling    Patient Measurements: Height: 5\' 4"  (162.6 cm) Weight: 163 lb 1.6 oz (73.982 kg) IBW/kg (Calculated) : 54.7 Heparin Dosing Weight: 70.3 kg  Vital Signs: Temp: 98 F (36.7 C) (06/15 0500) BP: 139/65 mmHg (06/15 1000) Pulse Rate: 71 (06/15 1000)  Labs:  Recent Labs  07/04/13 0345 07/04/13 1630 07/05/13 0425 07/06/13 0505  HGB  --  10.6*  --   --   HCT  --  32.5*  --   --   PLT  --  158  --   --   LABPROT 20.5*  --  17.5* 21.1*  INR 1.82*  --  1.48 1.89*  HEPARINUNFRC  --  0.60  --   --   CREATININE 2.04*  --  2.21* 2.05*    Estimated Creatinine Clearance: 27.3 ml/min (by C-G formula based on Cr of 2.05).   Assessment: 37 YOF on warfarin PTA for hx Afib/antiphospholipid syndrome, on lovenox/coumadin bridge while INR remains <2. INR 1.89 this morning. Cr 2.21, CrCl~25-30 ml/min.   Goal of Therapy:  INR 2-3 Anti-Xa level 0.6-1 units/ml 4hrs after LMWH dose given Monitor platelets by anticoagulation protocol: Yes   Plan:  1. Continue lovenox 75 mg SQ every 24 hours 2. Warfarin 3 mg x 1 dose at 1800 today, INR likely up to goal tomorrow 3. Will continue to monitor for any signs/symptoms of bleeding and will follow up with PT/INR in the a.m.   Maryanna Shape, PharmD, BCPS  Clinical Pharmacist  Pager: [phone]  07/06/2013 10:30 AM

## 2013-07-06 NOTE — Progress Notes (Signed)
CARDIAC REHAB PHASE I   PRE:  Rate/Rhythm: 70 SR  BP:  Sitting: 152/76      SaO2: 98 RA  MODE:  Ambulation: 400 ft   POST:  Rate/Rhythm: 83 SR  BP:  Sitting: 132/70     SaO2: 98 RA [id number] Patient ambulated in hallway x 1 assist with RW. Steady gait noted. Patient stated she doesn't walk much and relies on electronic scooter to shop. Pt stated her hips and knees cause her to have an unsteady gait without a cane. Denies SOB, but complains of the same indigestion feeling after ambulating 200 feet. Pressure 4/10 and only last a few seconds. Denies nausea with episode. Patient requested to continue ambulating back to room. Rosaria Ferries PA assessed patient once back to room. Denied pain/pressure at that time. Post walk, patient requested to sit on side of bed with call bell and phone in reach.  Santina Evans, BSN 07/06/2013 9:19 AM

## 2013-07-06 NOTE — Progress Notes (Signed)
Reviewed discharge instructions with patient and she stated her understanding.Discharged home with daughter via wheelchair.  Sanda Linger

## 2013-07-07 LAB — TACROLIMUS LEVEL: Tacrolimus (FK506) - LabCorp: 5 ng/mL

## 2013-07-17 ENCOUNTER — Ambulatory Visit: Payer: Medicare Other | Admitting: Cardiology

## 2013-07-22 ENCOUNTER — Encounter: Payer: Self-pay | Admitting: Cardiology

## 2013-07-22 ENCOUNTER — Ambulatory Visit (INDEPENDENT_AMBULATORY_CARE_PROVIDER_SITE_OTHER): Payer: Medicare Other | Admitting: Cardiology

## 2013-07-22 VITALS — BP 144/62 | HR 60 | Ht 61.0 in | Wt 167.0 lb

## 2013-07-22 DIAGNOSIS — E785 Hyperlipidemia, unspecified: Secondary | ICD-10-CM

## 2013-07-22 DIAGNOSIS — I209 Angina pectoris, unspecified: Secondary | ICD-10-CM

## 2013-07-22 DIAGNOSIS — I4891 Unspecified atrial fibrillation: Secondary | ICD-10-CM

## 2013-07-22 DIAGNOSIS — R0989 Other specified symptoms and signs involving the circulatory and respiratory systems: Secondary | ICD-10-CM

## 2013-07-22 DIAGNOSIS — I480 Paroxysmal atrial fibrillation: Secondary | ICD-10-CM

## 2013-07-22 DIAGNOSIS — R011 Cardiac murmur, unspecified: Secondary | ICD-10-CM

## 2013-07-22 DIAGNOSIS — I10 Essential (primary) hypertension: Secondary | ICD-10-CM

## 2013-07-22 HISTORY — DX: Hyperlipidemia, unspecified: E78.5

## 2013-07-22 MED ORDER — ATORVASTATIN CALCIUM 10 MG PO TABS: 10.0000 mg | ORAL_TABLET | Freq: Every day | ORAL | Status: DC

## 2013-07-22 NOTE — Progress Notes (Signed)
[place], [place], [place]  [address] Phone: [phone] Fax:  [phone]  Date:  07/22/2013   ID:  Debbie [name], DOB Jun 25, 1950, MRN [id number]  PCP:  Placido Sou, MD  Cardiologist:  Fransico Him, MD     History of Present Illness: Debbie [name] is a 63 y.o. 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 She also has a history of esophageal stricture. She was admitted to [facility] and ruled out for MI by enzymes.  She underwent nuclear stress test which showed evidence of subendocardial scar and possible soft tissue attenuation in the inferior/inferolateral distal anterior and apical walls. with mild distal anterior/apical ischemia. LVEF 54%.  Since this was a low risk study and given patient's history of kidney transplant and CRI medical therapy was recommended.  She now presents back for followup. She is doing well.  She has had about 3 epsiodes of chest pain since getting out of [facility] and only lasted about 5 minutes.  She has some mild SOB.  She denies any LE edema.       Wt Readings from Last 3 Encounters:  07/22/13 167 lb (75.751 kg)  07/06/13 163 lb 1.6 oz (73.982 kg)  04/01/12 143 lb 12.8 oz (65.227 kg)     Past Medical History  Diagnosis Date  . Kidney transplant as cause of abnormal reaction or later complication   . Hypertension   . Glaucoma   . Colon polyps   . Gall stones 1980  . Hematoma   . Anti-phospholipid antibody syndrome     (Based on hospital discharge summary march 2013)  . Atrial fibrillation   . Umbilical hernia 5038    pt denies this hx on 07/02/2013  . Carpal tunnel syndrome 2003    lt  . Esophageal stricture   . Hemorrhoids   . Renal disorder   . ESRD (end stage renal disease) on dialysis     "til I got my transplant in 2010"  . DVT  (deep venous thrombosis) 1990    LLE  . Pneumonia     "twice" (07/02/2013)  . History of blood transfusion     "related to the lupus"  . SLE (systemic lupus erythematosus)   . Arthritis     "hands" (07/02/2013)    Current Outpatient Prescriptions  Medication Sig Dispense Refill  . ALPHAGAN P 0.1 % SOLN Place 1 drop into both eyes at bedtime.       Marland Kitchen aspirin EC 81 MG tablet Take 81 mg by mouth daily.      . calcitRIOL (ROCALTROL) 0.25 MCG capsule Take 0.25-0.5 mcg by mouth daily. Take 0.25 mcg daily alternating with 0.5 mcg      . isosorbide mononitrate (IMDUR) 30 MG 24 hr tablet Take 1 tablet (30 mg total) by mouth daily.  30 tablet  0  . labetalol (NORMODYNE) 200 MG tablet Take 200 mg by mouth daily.       Marland Kitchen LUMIGAN 0.01 % SOLN Place 1 drop into both eyes at bedtime.       . mycophenolate (MYFORTIC) 180 MG EC tablet Take 180 mg by mouth 2 (two) times daily.       . ranitidine (ZANTAC) 150 MG tablet Take 150 mg by mouth daily.       Marland Kitchen  ranolazine (RANEXA) 1000 MG SR tablet Take 1 tablet (1,000 mg total) by mouth 2 (two) times daily.  60 tablet  0  . sodium bicarbonate 650 MG tablet Take 650 mg by mouth 2 (two) times daily.      . tacrolimus (PROGRAF) 1 MG capsule Take 3-4 mg by mouth See admin instructions. 4 caps in am and 3 caps in pm.      . timolol (BETIMOL) 0.5 % ophthalmic solution Place 1 drop into both eyes 2 (two) times daily.      Marland Kitchen warfarin (COUMADIN) 2.5 MG tablet Take 2.5 mg by mouth every evening.       . furosemide (LASIX) 20 MG tablet Take 20 mg by mouth daily as needed. Excess edema       No current facility-administered medications for this visit.    Allergies:    Allergies  Allergen Reactions  . Gentamicin Itching    unknown  . Gentamycin [Gentamicin Sulfate] Itching and Swelling  . Penicillins Itching  . Vancomycin Itching and Swelling    Social History:  The patient  reports that she quit smoking about 36 years ago. She has never used smokeless tobacco. She  reports that she does not drink alcohol or use illicit drugs.   Family History:  The patient's family history includes Heart disease in her mother; Hypertension in her mother. There is no history of Colon cancer, Rectal cancer, or Stomach cancer.   ROS:  Please see the history of present illness.      All other systems reviewed and negative.   PHYSICAL EXAM: VS:  BP 144/62  Pulse 60  Ht 5\' 1"  (1.549 m)  Wt 167 lb (75.751 kg)  BMI 31.57 kg/m2 Well nourished, well developed, in no acute distress HEENT: normal Neck: no JVDleft carotid artery bruit Cardiac:  normal S1, S2; RRR; 2/6 Systolic murmur at LLSB to apex Lungs:  clear to auscultation bilaterally, no wheezing, rhonchi or rales Abd: soft, nontender, no hepatomegaly Ext: no edema Skin: warm and dry Neuro:  CNs 2-12 intact, no focal abnormalities noted       ASSESSMENT AND PLAN:  1.  Chronic angina with low risk nuclear stress test.  She has had minimal pain since her hospitalization.  She is on medical management due to kidney transplant and now stage 4 CKD.   - continue ASA/Imdur/Ranexa - Cannot increase her Imdur any further due to history of headaches with higher doses.  She will let me know if her CP becomes more freqeunt or severe 2.  S/P kidney transplant now with stage 4 CKD 3.  HTN - fairly well controlled - continue beta blocker 4.  Remote PAF at time of kidney transplant with no reoccurence. 5.  Heart murmur - 2D echo to evaluate 6.  Dyslipidemia - last LDL in June was 108.  With presumed CAD and HTN her LDL goal is <70.  I will start her on Lipitor 10mg  daily and repeat FLP and ALT in 6 weeks 7.   Carotid artery bruit - will get carotid dopplers  Followup with me in 3 months  Signed, Fransico Him, MD 07/22/2013 9:21 AM

## 2013-07-22 NOTE — Patient Instructions (Addendum)
Your physician has recommended you make the following change in your medication: 1. Start Lipitor 10 Mg 1 tablet daily  Your physician recommends that you return for lab work in: 6 weeks for Fasting LIPID and ALT (schedule at check out before leaving today)  Your physician has requested that you have a carotid duplex. This test is an ultrasound of the carotid arteries in your neck. It looks at blood flow through these arteries that supply the brain with blood. Allow one hour for this exam. There are no restrictions or special instructions.  Your physician has requested that you have an echocardiogram. Echocardiography is a painless test that uses sound waves to create images of your heart. It provides your doctor with information about the size and shape of your heart and how well your heart's chambers and valves are working. This procedure takes approximately one hour. There are no restrictions for this procedure.  Your physician recommends that you schedule a follow-up appointment in: 3 Months with Dr Radford Pax

## 2013-08-07 ENCOUNTER — Ambulatory Visit (HOSPITAL_COMMUNITY): Payer: Medicare Other | Attending: Internal Medicine | Admitting: *Deleted

## 2013-08-07 ENCOUNTER — Encounter (HOSPITAL_COMMUNITY): Payer: Medicare Other

## 2013-08-07 ENCOUNTER — Ambulatory Visit (HOSPITAL_COMMUNITY): Payer: Medicare Other | Attending: Internal Medicine

## 2013-08-07 DIAGNOSIS — I10 Essential (primary) hypertension: Secondary | ICD-10-CM | POA: Diagnosis not present

## 2013-08-07 DIAGNOSIS — R0989 Other specified symptoms and signs involving the circulatory and respiratory systems: Secondary | ICD-10-CM | POA: Diagnosis not present

## 2013-08-07 DIAGNOSIS — I6529 Occlusion and stenosis of unspecified carotid artery: Secondary | ICD-10-CM | POA: Diagnosis not present

## 2013-08-07 DIAGNOSIS — I658 Occlusion and stenosis of other precerebral arteries: Secondary | ICD-10-CM | POA: Insufficient documentation

## 2013-08-07 DIAGNOSIS — R011 Cardiac murmur, unspecified: Secondary | ICD-10-CM | POA: Diagnosis present

## 2013-08-07 NOTE — Progress Notes (Signed)
2D Echo completed. 08/07/2013

## 2013-08-07 NOTE — Progress Notes (Signed)
Carotid duplex complete 

## 2013-08-10 ENCOUNTER — Other Ambulatory Visit: Payer: Self-pay | Admitting: General Surgery

## 2013-08-10 DIAGNOSIS — Z862 Personal history of diseases of the blood and blood-forming organs and certain disorders involving the immune mechanism: Secondary | ICD-10-CM

## 2013-08-10 DIAGNOSIS — I4891 Unspecified atrial fibrillation: Secondary | ICD-10-CM

## 2013-08-10 DIAGNOSIS — I272 Pulmonary hypertension, unspecified: Secondary | ICD-10-CM

## 2013-08-10 DIAGNOSIS — D6861 Antiphospholipid syndrome: Secondary | ICD-10-CM

## 2013-08-12 ENCOUNTER — Ambulatory Visit (HOSPITAL_COMMUNITY): Payer: Medicare Other

## 2013-08-13 ENCOUNTER — Ambulatory Visit (HOSPITAL_COMMUNITY): Payer: Medicare Other

## 2013-08-17 ENCOUNTER — Telehealth: Payer: Self-pay | Admitting: Cardiology

## 2013-08-17 NOTE — Telephone Encounter (Signed)
Due to family emergency I will have to cancel the VQ scan schedule for 08-18-13.  I will call back at a later date to schedule.

## 2013-08-17 NOTE — Telephone Encounter (Signed)
To Dr Radford Pax to make aware.

## 2013-08-18 ENCOUNTER — Ambulatory Visit (HOSPITAL_COMMUNITY): Payer: Medicare Other

## 2013-08-18 ENCOUNTER — Telehealth: Payer: Self-pay | Admitting: General Surgery

## 2013-08-18 NOTE — Telephone Encounter (Signed)
Pt is aware of appts

## 2013-08-20 ENCOUNTER — Encounter (HOSPITAL_COMMUNITY): Payer: Medicare Other

## 2013-08-20 ENCOUNTER — Ambulatory Visit (HOSPITAL_COMMUNITY)
Admission: RE | Admit: 2013-08-20 | Discharge: 2013-08-20 | Disposition: A | Discharge status: Home / Self Care | Payer: Medicare Other | Source: Ambulatory Visit | Attending: Cardiology | Admitting: Cardiology

## 2013-08-20 ENCOUNTER — Emergency Department (INDEPENDENT_AMBULATORY_CARE_PROVIDER_SITE_OTHER)
Admission: EM | Admit: 2013-08-20 | Discharge: 2013-08-20 | Disposition: A | Discharge status: Home / Self Care | Payer: Medicare Other | Source: Home / Self Care | Attending: Family Medicine | Admitting: Family Medicine

## 2013-08-20 ENCOUNTER — Encounter (HOSPITAL_COMMUNITY): Payer: Self-pay | Admitting: Emergency Medicine

## 2013-08-20 DIAGNOSIS — J189 Pneumonia, unspecified organism: Secondary | ICD-10-CM

## 2013-08-20 DIAGNOSIS — R918 Other nonspecific abnormal finding of lung field: Secondary | ICD-10-CM | POA: Insufficient documentation

## 2013-08-20 DIAGNOSIS — R0989 Other specified symptoms and signs involving the circulatory and respiratory systems: Secondary | ICD-10-CM | POA: Diagnosis not present

## 2013-08-20 DIAGNOSIS — Z862 Personal history of diseases of the blood and blood-forming organs and certain disorders involving the immune mechanism: Secondary | ICD-10-CM | POA: Diagnosis present

## 2013-08-20 DIAGNOSIS — I272 Pulmonary hypertension, unspecified: Secondary | ICD-10-CM

## 2013-08-20 DIAGNOSIS — I517 Cardiomegaly: Secondary | ICD-10-CM | POA: Insufficient documentation

## 2013-08-20 DIAGNOSIS — D6861 Antiphospholipid syndrome: Secondary | ICD-10-CM

## 2013-08-20 MED ORDER — TECHNETIUM TO 99M ALBUMIN AGGREGATED: 6.0000 | Freq: Once | INTRAVENOUS | Status: DC | PRN

## 2013-08-20 MED ORDER — CEFDINIR 300 MG PO CAPS: 300.0000 mg | ORAL_CAPSULE | Freq: Every day | ORAL | Status: DC

## 2013-08-20 MED ORDER — DOXYCYCLINE HYCLATE 100 MG PO CAPS: 100.0000 mg | ORAL_CAPSULE | Freq: Two times a day (BID) | ORAL | Status: DC

## 2013-08-20 MED ORDER — TECHNETIUM TC 99M DIETHYLENETRIAME-PENTAACETIC ACID: 40.0000 | Freq: Once | INTRAVENOUS | Status: DC | PRN

## 2013-08-20 NOTE — Discharge Instructions (Signed)
Thank you for coming in today. Call or go to the emergency room if you get worse, have trouble breathing, have chest pains, or palpitations.  Take both antibiotics for 1 week.  Follow up MOnday for your warfarin to be checked.  Establish with a primary care doctor.  You will need a repeat chest xray in about 6 weeks to make sure this gets better.   Pneumonia Pneumonia is an infection of the lungs.  CAUSES Pneumonia may be caused by bacteria or a virus. Usually, these infections are caused by breathing infectious particles into the lungs (respiratory tract). SIGNS AND SYMPTOMS   Cough.  Fever.  Chest pain.  Increased rate of breathing.  Wheezing.  Mucus production. DIAGNOSIS  If you have the common symptoms of pneumonia, your health care provider will typically confirm the diagnosis with a chest X-ray. The X-ray will show an abnormality in the lung (pulmonary infiltrate) if you have pneumonia. Other tests of your blood, urine, or sputum may be done to find the specific cause of your pneumonia. Your health care provider may also do tests (blood gases or pulse oximetry) to see how well your lungs are working. TREATMENT  Some forms of pneumonia may be spread to other people when you cough or sneeze. You may be asked to wear a mask before and during your exam. Pneumonia that is caused by bacteria is treated with antibiotic medicine. Pneumonia that is caused by the influenza virus may be treated with an antiviral medicine. Most other viral infections must run their course. These infections will not respond to antibiotics.  HOME CARE INSTRUCTIONS   Cough suppressants may be used if you are losing too much rest. However, coughing protects you by clearing your lungs. You should avoid using cough suppressants if you can.  Your health care provider may have prescribed medicine if he or she thinks your pneumonia is caused by bacteria or influenza. Finish your medicine even if you start to feel  better.  Your health care provider may also prescribe an expectorant. This loosens the mucus to be coughed up.  Take medicines only as directed by your health care provider.  Do not smoke. Smoking is a common cause of bronchitis and can contribute to pneumonia. If you are a smoker and continue to smoke, your cough may last several weeks after your pneumonia has cleared.  A cold steam vaporizer or humidifier in your room or home may help loosen mucus.  Coughing is often worse at night. Sleeping in a semi-upright position in a recliner or using a couple pillows under your head will help with this.  Get rest as you feel it is needed. Your body will usually let you know when you need to rest. PREVENTION A pneumococcal shot (vaccine) is available to prevent a common bacterial cause of pneumonia. This is usually suggested for:  People over 31 years old.  Patients on chemotherapy.  People with chronic lung problems, such as bronchitis or emphysema.  People with immune system problems. If you are over 65 or have a high risk condition, you may receive the pneumococcal vaccine if you have not received it before. In some countries, a routine influenza vaccine is also recommended. This vaccine can help prevent some cases of pneumonia.You may be offered the influenza vaccine as part of your care. If you smoke, it is time to quit. You may receive instructions on how to stop smoking. Your health care provider can provide medicines and counseling to help you quit.  SEEK MEDICAL CARE IF: You have a fever. SEEK IMMEDIATE MEDICAL CARE IF:   Your illness becomes worse. This is especially true if you are elderly or weakened from any other disease.  You cannot control your cough with suppressants and are losing sleep.  You begin coughing up blood.  You develop pain which is getting worse or is uncontrolled with medicines.  Any of the symptoms which initially brought you in for treatment are getting  worse rather than better.  You develop shortness of breath or chest pain. MAKE SURE YOU:   Understand these instructions.  Will watch your condition.  Will get help right away if you are not doing well or get worse. Document Released: 01/08/2005 Document Revised: 05/25/2013 Document Reviewed: 03/30/2010 [facility] Patient Information 2015 [place], [place]. This information is not intended to replace advice given to you by your health care provider. Make sure you discuss any questions you have with your health care provider.   PRIMARY CARE Paramedic at [place], [place] Ph [phone]  Fax [phone]  Therapist, music at [facility] [address]. [place], [place] Ph [phone]  Fax [phone]  Therapist, music at [place] / [place] [id number], [place] Ph [phone]  Fax [phone]  [facility] at [facility] [address], [place]  [place], [place] Ph [phone]  Fax [phone]  [place] [address] [place] [address] [place], [place] Ph [phone]  Fax [phone]  [facility] at [facility] [place], [place] Ph [phone]  Fax [phone]   [place] @ [place] [address] Phone: [phone]   [place] @ [facility] [place]. [place] [address] Phone: [place] @ [place] [place] [address] Phone: [place] @ [place]. [place] [address] Phone: [place] @ [facility] [employer] [place], [place] [address] Phone: [phone]   [place] @ [place] [address]  [place] [address] Phone: [phone]

## 2013-08-20 NOTE — ED Notes (Signed)
Pt states that she was to come here for treatment of pneumonia.    Pt was having fever, chills, and wheezing mainly at night.    Denies any other symptoms.

## 2013-08-20 NOTE — ED Provider Notes (Signed)
Debbie [name] is a 63 y.o. female who presents to Urgent Care today for pneumonia. Patient had a previously scheduled VQ scan an x-ray done today which showed pneumonia. She notes cough present for the last month. She denies any shortness of breath nausea vomiting or diarrhea. Her cardiologist ordered the scan but if she does not have her primary care provider recommended she presented to urgent care for treatment. She notes an allergy to penicillin described as itching. She denies any anaphylaxis and has taken cephalosporins in the past.   Past Medical History  Diagnosis Date  . Kidney transplant as cause of abnormal reaction or later complication   . Hypertension   . Glaucoma   . Colon polyps   . Gall stones 1980  . Hematoma   . Anti-phospholipid antibody syndrome     (Based on hospital discharge summary march 2013)  . Atrial fibrillation   . Umbilical hernia 7893    pt denies this hx on 07/02/2013  . Carpal tunnel syndrome 2003    lt  . Esophageal stricture   . Hemorrhoids   . Renal disorder   . ESRD (end stage renal disease) on dialysis     "til I got my transplant in 2010"  . DVT (deep venous thrombosis) 1990    LLE  . Pneumonia     "twice" (07/02/2013)  . History of blood transfusion     "related to the lupus"  . SLE (systemic lupus erythematosus)   . Arthritis     "hands" (07/02/2013)  . Dyslipidemia 07/22/2013   History  Substance Use Topics  . Smoking status: Former Smoker -- 1.00 packs/day for 30 years    Quit date: 04/16/1977  . Smokeless tobacco: Never Used  . Alcohol Use: No   ROS as above Medications: No current facility-administered medications for this encounter.   Current Outpatient Prescriptions  Medication Sig Dispense Refill  . ALPHAGAN P 0.1 % SOLN Place 1 drop into both eyes at bedtime.       Marland Kitchen aspirin EC 81 MG tablet Take 81 mg by mouth daily.      Marland Kitchen atorvastatin (LIPITOR) 10 MG tablet Take 1 tablet (10 mg total) by mouth daily.  30 tablet  11  .  calcitRIOL (ROCALTROL) 0.25 MCG capsule Take 0.25-0.5 mcg by mouth daily. Take 0.25 mcg daily alternating with 0.5 mcg      . cefdinir (OMNICEF) 300 MG capsule Take 1 capsule (300 mg total) by mouth daily.  7 capsule  0  . doxycycline (VIBRAMYCIN) 100 MG capsule Take 1 capsule (100 mg total) by mouth 2 (two) times daily.  14 capsule  0  . furosemide (LASIX) 20 MG tablet Take 20 mg by mouth daily as needed. Excess edema      . isosorbide mononitrate (IMDUR) 30 MG 24 hr tablet Take 1 tablet (30 mg total) by mouth daily.  30 tablet  0  . labetalol (NORMODYNE) 200 MG tablet Take 200 mg by mouth daily.       Marland Kitchen LUMIGAN 0.01 % SOLN Place 1 drop into both eyes at bedtime.       . mycophenolate (MYFORTIC) 180 MG EC tablet Take 180 mg by mouth 2 (two) times daily.       . ranitidine (ZANTAC) 150 MG tablet Take 150 mg by mouth daily.       . ranolazine (RANEXA) 1000 MG SR tablet Take 1 tablet (1,000 mg total) by mouth 2 (two) times daily.  60 tablet  0  . sodium bicarbonate 650 MG tablet Take 650 mg by mouth 2 (two) times daily.      . tacrolimus (PROGRAF) 1 MG capsule Take 3-4 mg by mouth See admin instructions. 4 caps in am and 3 caps in pm.      . timolol (BETIMOL) 0.5 % ophthalmic solution Place 1 drop into both eyes 2 (two) times daily.      Marland Kitchen warfarin (COUMADIN) 2.5 MG tablet Take 2.5 mg by mouth every evening.        Facility-Administered Medications Ordered in Other Encounters  Medication Dose Route Frequency Provider Last Rate Last Dose  . technetium albumin aggregated (MAA) injection solution 6 milli Curie  6 milli Curie Intravenous Once PRN Medication Radiologist, MD      . technetium TC 77M diethylenetriame-pentaacetic acid (DTPA) injection 40 milli Curie  40 milli Curie Intravenous Once PRN Medication Radiologist, MD        Exam:  BP 150/61  Pulse 68  Temp(Src) 98.1 F (36.7 C) (Oral)  Resp 16  SpO2 100% Gen: Well NAD HEENT: EOMI,  MMM Lungs: Normal work of breathing. CTABL Heart:  RRR no MRG Abd: NABS, Soft. Nondistended, Nontender Exts: Brisk capillary refill, warm and well perfused.   No results found for this or any previous visit (from the past 24 hour(s)). Dg Chest 2 View  08/20/2013   CLINICAL DATA:  Pulmonary embolism.  EXAM: CHEST  2 VIEW  COMPARISON:  06/30/2013.  FINDINGS: The cardiopericardial silhouette is mildly enlarged for projection. There is airspace disease in the LEFT lower lobe obscuring portions of the LEFT heart border. Small LEFT pleural effusion is present. LEFT basilar atelectasis.  Pulmonary vascular congestion is present. Scarring is present in the LEFT mid lung.  Chronic fullness of the pulmonary hila. LEFT pleural effusion appears larger than on prior examinations.  IMPRESSION: 1. LEFT lower lobe airspace disease favored to represent pneumonia or aspiration rather than asymmetric pulmonary edema. Followup in 4-6 weeks to ensure radiographic clearing and exclude an underlying lesion is recommended. 2. Mild cardiomegaly. 3. Pulmonary vascular congestion.   Electronically Signed   By: Dereck Ligas M.D.   On: 08/20/2013 15:12   Nm Pulmonary Perf And Vent  08/20/2013   CLINICAL DATA:  Shortness of breath and chest pain. History of renal transplant. Atrial fibrillation.  EXAM: NUCLEAR MEDICINE VENTILATION - PERFUSION LUNG SCAN  TECHNIQUE: Ventilation images were obtained in multiple projections using inhaled aerosol technetium 99 M DTPA. Perfusion images were obtained in multiple projections after intravenous injection of Tc-54m MAA.  RADIOPHARMACEUTICALS:  40.0 mCi Tc-3m DTPA aerosol and 6.0 mCi Tc-70m MAA  COMPARISON:  Chest radiograph of earlier in the day.  FINDINGS: Ventilation: Heterogeneous tracer distribution. No wedge-shaped ventilation defects. Vague decreased ventilation to the lingula and left upper lobe, possibly related to airspace disease on today's chest radiograph.  Perfusion: No wedge shaped peripheral perfusion defects to suggest acute  pulmonary embolism.  IMPRESSION: No evidence of pulmonary embolism.   Electronically Signed   By: Abigail Miyamoto M.D.   On: 08/20/2013 15:44    Assessment and Plan: 63 y.o. female with pneumonia. Likely community acquired. Treatment options are somewhat limited. Will use Omnicef and doxycycline to treat for strep about an atypical pathogens. Will avoid Levaquin given patient's slightly prolonged QT segment. Recommend patient followup with her primary care doctor in the near future. Repeat chest x-ray in 6 weeks per radiology.  Discussed warning signs or symptoms. Please see discharge instructions. Patient expresses  understanding.   This note was created using Systems analyst. Any transcription errors are unintended.    Gregor Hams, MD 08/20/13 [id number]

## 2013-08-24 ENCOUNTER — Other Ambulatory Visit: Payer: Self-pay

## 2013-08-24 ENCOUNTER — Encounter (HOSPITAL_COMMUNITY): Payer: Self-pay | Admitting: Emergency Medicine

## 2013-08-24 ENCOUNTER — Emergency Department (HOSPITAL_COMMUNITY): Payer: Medicare Other

## 2013-08-24 ENCOUNTER — Observation Stay (HOSPITAL_BASED_OUTPATIENT_CLINIC_OR_DEPARTMENT_OTHER)
Admission: EM | Admit: 2013-08-24 | Discharge: 2013-08-26 | Disposition: A | Discharge status: Home / Self Care | Payer: Medicare Other | Source: Home / Self Care | Attending: Emergency Medicine | Admitting: Emergency Medicine

## 2013-08-24 DIAGNOSIS — I4891 Unspecified atrial fibrillation: Secondary | ICD-10-CM | POA: Diagnosis present

## 2013-08-24 DIAGNOSIS — N183 Chronic kidney disease, stage 3 unspecified: Secondary | ICD-10-CM

## 2013-08-24 DIAGNOSIS — I209 Angina pectoris, unspecified: Secondary | ICD-10-CM | POA: Diagnosis present

## 2013-08-24 DIAGNOSIS — R0789 Other chest pain: Principal | ICD-10-CM | POA: Diagnosis present

## 2013-08-24 DIAGNOSIS — Z79899 Other long term (current) drug therapy: Secondary | ICD-10-CM | POA: Insufficient documentation

## 2013-08-24 DIAGNOSIS — Z87891 Personal history of nicotine dependence: Secondary | ICD-10-CM

## 2013-08-24 DIAGNOSIS — K222 Esophageal obstruction: Secondary | ICD-10-CM | POA: Insufficient documentation

## 2013-08-24 DIAGNOSIS — H409 Unspecified glaucoma: Secondary | ICD-10-CM | POA: Insufficient documentation

## 2013-08-24 DIAGNOSIS — I2510 Atherosclerotic heart disease of native coronary artery without angina pectoris: Secondary | ICD-10-CM | POA: Diagnosis present

## 2013-08-24 DIAGNOSIS — Z880 Allergy status to penicillin: Secondary | ICD-10-CM

## 2013-08-24 DIAGNOSIS — Z8701 Personal history of pneumonia (recurrent): Secondary | ICD-10-CM | POA: Insufficient documentation

## 2013-08-24 DIAGNOSIS — R079 Chest pain, unspecified: Secondary | ICD-10-CM | POA: Insufficient documentation

## 2013-08-24 DIAGNOSIS — I120 Hypertensive chronic kidney disease with stage 5 chronic kidney disease or end stage renal disease: Secondary | ICD-10-CM | POA: Diagnosis present

## 2013-08-24 DIAGNOSIS — Z860100 Personal history of colon polyps, unspecified: Secondary | ICD-10-CM | POA: Insufficient documentation

## 2013-08-24 DIAGNOSIS — E785 Hyperlipidemia, unspecified: Secondary | ICD-10-CM | POA: Diagnosis present

## 2013-08-24 DIAGNOSIS — G5600 Carpal tunnel syndrome, unspecified upper limb: Secondary | ICD-10-CM | POA: Diagnosis present

## 2013-08-24 DIAGNOSIS — N189 Chronic kidney disease, unspecified: Secondary | ICD-10-CM

## 2013-08-24 DIAGNOSIS — J9819 Other pulmonary collapse: Secondary | ICD-10-CM | POA: Diagnosis present

## 2013-08-24 DIAGNOSIS — K219 Gastro-esophageal reflux disease without esophagitis: Secondary | ICD-10-CM | POA: Diagnosis present

## 2013-08-24 DIAGNOSIS — I10 Essential (primary) hypertension: Secondary | ICD-10-CM | POA: Insufficient documentation

## 2013-08-24 DIAGNOSIS — D6859 Other primary thrombophilia: Secondary | ICD-10-CM | POA: Diagnosis present

## 2013-08-24 DIAGNOSIS — J189 Pneumonia, unspecified organism: Secondary | ICD-10-CM | POA: Diagnosis present

## 2013-08-24 DIAGNOSIS — Z9889 Other specified postprocedural states: Secondary | ICD-10-CM | POA: Insufficient documentation

## 2013-08-24 DIAGNOSIS — N289 Disorder of kidney and ureter, unspecified: Secondary | ICD-10-CM | POA: Diagnosis present

## 2013-08-24 DIAGNOSIS — T8610 Unspecified complication of kidney transplant: Secondary | ICD-10-CM | POA: Insufficient documentation

## 2013-08-24 DIAGNOSIS — M19049 Primary osteoarthritis, unspecified hand: Secondary | ICD-10-CM | POA: Insufficient documentation

## 2013-08-24 DIAGNOSIS — Z792 Long term (current) use of antibiotics: Secondary | ICD-10-CM | POA: Insufficient documentation

## 2013-08-24 DIAGNOSIS — Z86718 Personal history of other venous thrombosis and embolism: Secondary | ICD-10-CM

## 2013-08-24 DIAGNOSIS — I158 Other secondary hypertension: Secondary | ICD-10-CM | POA: Diagnosis present

## 2013-08-24 DIAGNOSIS — K8020 Calculus of gallbladder without cholecystitis without obstruction: Secondary | ICD-10-CM

## 2013-08-24 DIAGNOSIS — Z940 Kidney transplant status: Secondary | ICD-10-CM

## 2013-08-24 DIAGNOSIS — Y830 Surgical operation with transplant of whole organ as the cause of abnormal reaction of the patient, or of later complication, without mention of misadventure at the time of the procedure: Secondary | ICD-10-CM | POA: Insufficient documentation

## 2013-08-24 DIAGNOSIS — N186 End stage renal disease: Secondary | ICD-10-CM | POA: Insufficient documentation

## 2013-08-24 DIAGNOSIS — M549 Dorsalgia, unspecified: Secondary | ICD-10-CM | POA: Diagnosis present

## 2013-08-24 DIAGNOSIS — K649 Unspecified hemorrhoids: Secondary | ICD-10-CM

## 2013-08-24 DIAGNOSIS — Z7901 Long term (current) use of anticoagulants: Secondary | ICD-10-CM

## 2013-08-24 DIAGNOSIS — Z992 Dependence on renal dialysis: Secondary | ICD-10-CM

## 2013-08-24 DIAGNOSIS — I700 Atherosclerosis of aorta: Secondary | ICD-10-CM | POA: Diagnosis present

## 2013-08-24 DIAGNOSIS — I2789 Other specified pulmonary heart diseases: Secondary | ICD-10-CM | POA: Diagnosis present

## 2013-08-24 DIAGNOSIS — N179 Acute kidney failure, unspecified: Secondary | ICD-10-CM | POA: Diagnosis not present

## 2013-08-24 DIAGNOSIS — Z7982 Long term (current) use of aspirin: Secondary | ICD-10-CM

## 2013-08-24 DIAGNOSIS — Z883 Allergy status to other anti-infective agents status: Secondary | ICD-10-CM

## 2013-08-24 HISTORY — DX: Gastro-esophageal reflux disease without esophagitis: K21.9

## 2013-08-24 HISTORY — DX: Atherosclerotic heart disease of native coronary artery without angina pectoris: I25.10

## 2013-08-24 HISTORY — DX: Unspecified glaucoma: H40.9

## 2013-08-24 HISTORY — DX: Cardiac murmur, unspecified: R01.1

## 2013-08-24 LAB — CBC WITH DIFFERENTIAL/PLATELET
Basophils Absolute: 0 10*3/uL (ref 0.0–0.1)
Basophils Relative: 0 % (ref 0–1)
Eosinophils Absolute: 0.1 10*3/uL (ref 0.0–0.7)
Eosinophils Relative: 1 % (ref 0–5)
HEMATOCRIT: 29.9 % — AB (ref 36.0–46.0)
HEMOGLOBIN: 9.9 g/dL — AB (ref 12.0–15.0)
Lymphocytes Relative: 31 % (ref 12–46)
Lymphs Abs: 1.7 10*3/uL (ref 0.7–4.0)
MCH: 26.8 pg (ref 26.0–34.0)
MCHC: 33.1 g/dL (ref 30.0–36.0)
MCV: 81 fL (ref 78.0–100.0)
MONOS PCT: 8 % (ref 3–12)
Monocytes Absolute: 0.4 10*3/uL (ref 0.1–1.0)
NEUTROS PCT: 60 % (ref 43–77)
Neutro Abs: 3.5 10*3/uL (ref 1.7–7.7)
Platelets: 198 10*3/uL (ref 150–400)
RBC: 3.69 MIL/uL — ABNORMAL LOW (ref 3.87–5.11)
RDW: 14.4 % (ref 11.5–15.5)
WBC: 5.7 10*3/uL (ref 4.0–10.5)

## 2013-08-24 LAB — PROTIME-INR
INR: 2.02 — ABNORMAL HIGH (ref 0.00–1.49)
PROTHROMBIN TIME: 22.9 s — AB (ref 11.6–15.2)

## 2013-08-24 LAB — HEMOGLOBIN A1C
Hgb A1c MFr Bld: 5.4 % (ref <5.7)
Mean Plasma Glucose: 108 mg/dL (ref <117)

## 2013-08-24 LAB — BASIC METABOLIC PANEL
Anion gap: 16 — ABNORMAL HIGH (ref 5–15)
BUN: 28 mg/dL — AB (ref 6–23)
CHLORIDE: 97 meq/L (ref 96–112)
CO2: 19 meq/L (ref 19–32)
Calcium: 9.8 mg/dL (ref 8.4–10.5)
Creatinine, Ser: 2.35 mg/dL — ABNORMAL HIGH (ref 0.50–1.10)
GFR calc Af Amer: 24 mL/min — ABNORMAL LOW (ref >90)
GFR calc non Af Amer: 21 mL/min — ABNORMAL LOW (ref >90)
Glucose, Bld: 102 mg/dL — ABNORMAL HIGH (ref 70–99)
POTASSIUM: 4.8 meq/L (ref 3.7–5.3)
Sodium: 132 mEq/L — ABNORMAL LOW (ref 137–147)

## 2013-08-24 LAB — D-DIMER, QUANTITATIVE (NOT AT ARMC): D DIMER QUANT: 0.4 ug{FEU}/mL (ref 0.00–0.48)

## 2013-08-24 LAB — TROPONIN I: Troponin I: <0.3 ng/mL (ref <0.30)

## 2013-08-24 LAB — MAGNESIUM: MAGNESIUM: 1.2 mg/dL — AB (ref 1.5–2.5)

## 2013-08-24 LAB — I-STAT TROPONIN, ED: Troponin i, poc: 0.01 ng/mL (ref 0.00–0.08)

## 2013-08-24 LAB — PHOSPHORUS: Phosphorus: 3.2 mg/dL (ref 2.3–4.6)

## 2013-08-24 LAB — TSH: TSH: 5.73 u[IU]/mL — ABNORMAL HIGH (ref 0.350–4.500)

## 2013-08-24 MED ORDER — HYDRALAZINE HCL 10 MG PO TABS: 10.0000 mg | ORAL_TABLET | ORAL | Status: DC

## 2013-08-24 MED ORDER — TACROLIMUS 1 MG PO CAPS
3.0000 mg | ORAL_CAPSULE | Freq: Every day | ORAL | Status: DC
  Administered 2013-08-24 – 2013-08-26 (×2): 3 mg via ORAL
  Filled 2013-08-24 (×4): qty 3

## 2013-08-24 MED ORDER — MORPHINE SULFATE 4 MG/ML IJ SOLN
4.0000 mg | Freq: Once | INTRAMUSCULAR | Status: AC
  Administered 2013-08-24: 4 mg via INTRAVENOUS
  Filled 2013-08-24: qty 1

## 2013-08-24 MED ORDER — CEFDINIR 300 MG PO CAPS: 300.0000 mg | ORAL_CAPSULE | Freq: Every day | ORAL | Status: DC

## 2013-08-24 MED ORDER — ONDANSETRON HCL 4 MG/2ML IJ SOLN: 4.0000 mg | Freq: Four times a day (QID) | INTRAMUSCULAR | Status: DC | PRN

## 2013-08-24 MED ORDER — ASPIRIN 300 MG RE SUPP: 300.0000 mg | RECTAL | Status: AC

## 2013-08-24 MED ORDER — ASPIRIN EC 81 MG PO TBEC
81.0000 mg | DELAYED_RELEASE_TABLET | Freq: Every day | ORAL | Status: DC
  Administered 2013-08-25 – 2013-08-26 (×2): 81 mg via ORAL
  Filled 2013-08-24 (×2): qty 1

## 2013-08-24 MED ORDER — WARFARIN SODIUM 2.5 MG PO TABS
2.5000 mg | ORAL_TABLET | Freq: Once | ORAL | Status: AC
  Administered 2013-08-24: 2.5 mg via ORAL
  Filled 2013-08-24: qty 1

## 2013-08-24 MED ORDER — WARFARIN - PHARMACIST DOSING INPATIENT: Freq: Every day | Status: DC

## 2013-08-24 MED ORDER — SODIUM BICARBONATE 650 MG PO TABS
650.0000 mg | ORAL_TABLET | Freq: Two times a day (BID) | ORAL | Status: DC
  Administered 2013-08-24 – 2013-08-26 (×4): 650 mg via ORAL
  Filled 2013-08-24 (×6): qty 1

## 2013-08-24 MED ORDER — MAGNESIUM OXIDE 400 (241.3 MG) MG PO TABS
400.0000 mg | ORAL_TABLET | Freq: Two times a day (BID) | ORAL | Status: DC
  Administered 2013-08-24 – 2013-08-26 (×4): 400 mg via ORAL
  Filled 2013-08-24 (×7): qty 1

## 2013-08-24 MED ORDER — CALCITRIOL 0.5 MCG PO CAPS
0.5000 ug | ORAL_CAPSULE | ORAL | Status: DC
  Administered 2013-08-25: 0.5 ug via ORAL
  Filled 2013-08-24: qty 1

## 2013-08-24 MED ORDER — RANOLAZINE ER 500 MG PO TB12
1000.0000 mg | ORAL_TABLET | Freq: Two times a day (BID) | ORAL | Status: DC
  Administered 2013-08-24 – 2013-08-25 (×2): 1000 mg via ORAL
  Filled 2013-08-24 (×3): qty 2

## 2013-08-24 MED ORDER — ASPIRIN 81 MG PO CHEW
324.0000 mg | CHEWABLE_TABLET | ORAL | Status: AC
  Filled 2013-08-24: qty 4

## 2013-08-24 MED ORDER — CALCITRIOL 0.25 MCG PO CAPS
0.2500 ug | ORAL_CAPSULE | ORAL | Status: DC
  Administered 2013-08-24 – 2013-08-26 (×2): 0.25 ug via ORAL
  Filled 2013-08-24 (×2): qty 1

## 2013-08-24 MED ORDER — DOXYCYCLINE HYCLATE 100 MG PO TABS
100.0000 mg | ORAL_TABLET | Freq: Two times a day (BID) | ORAL | Status: DC
  Administered 2013-08-24 – 2013-08-26 (×4): 100 mg via ORAL
  Filled 2013-08-24 (×6): qty 1

## 2013-08-24 MED ORDER — HYDRALAZINE HCL 20 MG/ML IJ SOLN: 10.0000 mg | INTRAMUSCULAR | Status: DC | PRN

## 2013-08-24 MED ORDER — ASPIRIN EC 81 MG PO TBEC: 81.0000 mg | DELAYED_RELEASE_TABLET | Freq: Every day | ORAL | Status: DC

## 2013-08-24 MED ORDER — ONDANSETRON HCL 4 MG/2ML IJ SOLN
4.0000 mg | Freq: Once | INTRAMUSCULAR | Status: AC
  Administered 2013-08-24: 4 mg via INTRAVENOUS
  Filled 2013-08-24: qty 2

## 2013-08-24 MED ORDER — ASPIRIN 81 MG PO CHEW: 324.0000 mg | CHEWABLE_TABLET | Freq: Once | ORAL | Status: DC

## 2013-08-24 MED ORDER — SODIUM CHLORIDE 0.9 % IJ SOLN: 3.0000 mL | INTRAMUSCULAR | Status: DC | PRN

## 2013-08-24 MED ORDER — SODIUM CHLORIDE 0.9 % IV SOLN: 250.0000 mL | INTRAVENOUS | Status: DC | PRN

## 2013-08-24 MED ORDER — DOXYCYCLINE HYCLATE 100 MG PO CAPS: 100.0000 mg | ORAL_CAPSULE | Freq: Two times a day (BID) | ORAL | Status: DC

## 2013-08-24 MED ORDER — ACETAMINOPHEN 325 MG PO TABS: 650.0000 mg | ORAL_TABLET | ORAL | Status: DC | PRN

## 2013-08-24 MED ORDER — ATORVASTATIN CALCIUM 10 MG PO TABS
10.0000 mg | ORAL_TABLET | Freq: Every day | ORAL | Status: DC
  Administered 2013-08-24 – 2013-08-26 (×3): 10 mg via ORAL
  Filled 2013-08-24 (×5): qty 1

## 2013-08-24 MED ORDER — SODIUM CHLORIDE 0.9 % IJ SOLN
3.0000 mL | Freq: Two times a day (BID) | INTRAMUSCULAR | Status: DC
  Administered 2013-08-24 – 2013-08-26 (×4): 3 mL via INTRAVENOUS

## 2013-08-24 MED ORDER — ISOSORBIDE DINITRATE 10 MG PO TABS
10.0000 mg | ORAL_TABLET | Freq: Three times a day (TID) | ORAL | Status: DC
  Administered 2013-08-24 – 2013-08-26 (×6): 10 mg via ORAL
  Filled 2013-08-24 (×9): qty 1

## 2013-08-24 MED ORDER — TACROLIMUS 1 MG PO CAPS
4.0000 mg | ORAL_CAPSULE | Freq: Every day | ORAL | Status: DC
  Administered 2013-08-24 – 2013-08-26 (×3): 4 mg via ORAL
  Filled 2013-08-24 (×3): qty 4

## 2013-08-24 MED ORDER — FAMOTIDINE 20 MG PO TABS
20.0000 mg | ORAL_TABLET | Freq: Every day | ORAL | Status: DC
  Administered 2013-08-24 – 2013-08-26 (×3): 20 mg via ORAL
  Filled 2013-08-24 (×3): qty 1

## 2013-08-24 MED ORDER — NITROGLYCERIN 0.4 MG SL SUBL
0.4000 mg | SUBLINGUAL_TABLET | SUBLINGUAL | Status: DC | PRN
  Filled 2013-08-24: qty 1

## 2013-08-24 MED ORDER — TIMOLOL HEMIHYDRATE 0.5 % OP SOLN: 1.0000 [drp] | Freq: Two times a day (BID) | OPHTHALMIC | Status: DC

## 2013-08-24 MED ORDER — MYCOPHENOLATE SODIUM 180 MG PO TBEC
180.0000 mg | DELAYED_RELEASE_TABLET | Freq: Two times a day (BID) | ORAL | Status: DC
Start: 2013-08-24 — End: 2013-08-26
  Administered 2013-08-24 – 2013-08-26 (×5): 180 mg via ORAL
  Filled 2013-08-24 (×7): qty 1

## 2013-08-24 MED ORDER — TIMOLOL MALEATE 0.5 % OP SOLN
1.0000 [drp] | Freq: Two times a day (BID) | OPHTHALMIC | Status: DC
  Administered 2013-08-24 – 2013-08-26 (×4): 1 [drp] via OPHTHALMIC
  Filled 2013-08-24 (×3): qty 5

## 2013-08-24 MED ORDER — CEFUROXIME AXETIL 500 MG PO TABS
500.0000 mg | ORAL_TABLET | Freq: Every day | ORAL | Status: DC
  Administered 2013-08-24 – 2013-08-26 (×3): 500 mg via ORAL
  Filled 2013-08-24 (×4): qty 1

## 2013-08-24 NOTE — H&P (Signed)
Patient ID: Debbie [name] MRN: [id number], DOB/AGE: 1951-01-10   Admit date: 08/24/2013   Primary Physician: Placido Sou, MD Primary Cardiologist: Dr. Radford Pax   Pt. Profile:  Debbie [name] is a 63 y.o. 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   She was recently admitted to [facility] (6/9-6/15) for chest pain and ruled out for MI by enzymes. She underwent nuclear stress test which showed evidence of subendocardial scar and possible soft tissue attenuation in the inferior/inferolateral distal anterior and apical walls. with mild distal anterior/apical ischemia. LVEF 54%. Since this was a low risk study and given patient's history of kidney transplant and CRI medical therapy was recommended. She was seen in the office by Dr. Heron Nay earlier this month and was doing well at that time. She has had about 3 epsiodes of chest pain since getting out of [facility] and only lasted about 5 minutes. Patient reports around 4am having a sudden onset of chest pain in her left chest that radiated to her jaw. The pain is described as a pressure and squeezing and severe and worse with exertion. She reported mild SOB. The pain lasted about 10 minutes before resolving. The pain returned twice more with the same characteristics and completely resolved after taking nitro. Patient was diagnosed with pneumonia 4 days ago. She reports associated dyspnea on exertion. No other associated symptoms. Patient called EMS.    Problem List  Past Medical History  Diagnosis Date  . Kidney transplant as cause of abnormal reaction or later complication   . Hypertension   . Glaucoma   . Colon polyps   . Gall stones 1980  . Hematoma   . Anti-phospholipid antibody syndrome     (Based on  hospital discharge summary march 2013)  . Atrial fibrillation   . Umbilical hernia 2423    pt denies this hx on 07/02/2013  . Carpal tunnel syndrome 2003    lt  . Esophageal stricture   . Hemorrhoids   . Renal disorder   . ESRD (end stage renal disease) on dialysis     "til I got my transplant in 2010"  . DVT (deep venous thrombosis) 1990    LLE  . Pneumonia     "twice" (07/02/2013)  . History of blood transfusion     "related to the lupus"  . SLE (systemic lupus erythematosus)   . Arthritis     "hands" (07/02/2013)  . Dyslipidemia 07/22/2013  . Coronary artery disease     Past Surgical History  Procedure Laterality Date  . Nephrectomy transplanted organ  05/2008  . Cholecystectomy  1980  . Umbilical hernia repair  1998    pt denies this hx on 07/02/2013  . Hematoma evacuation      "after they took peritoneal catheter out"  . Peritoneal catheter insertion    . Peritoneal catheter removal    . Hernia repair      pt denies this hx on 07/02/2013  . Carpal tunnel release Left 07/2001  . Arteriovenous graft placement    . Thrombectomy and revision of arterioventous (av) goretex  graft Right 07/1999; 04/2002; 09/2002; 12/2005; 09/2007;     upper arm/notes 07/31/1999; 05/18/2002; 10/07/2002; 01/14/2006; 09/14/2007;      Allergies  Allergies  Allergen Reactions  .  Gentamicin Itching    unknown  . Gentamycin [Gentamicin Sulfate] Itching and Swelling  . Penicillins Itching  . Vancomycin Itching and Swelling     Home Medications  Prior to Admission medications   Medication Sig Start Date End Date Taking? Authorizing Provider  ALPHAGAN P 0.1 % SOLN Place 1 drop into both eyes at bedtime.  10/11/11  Yes Historical Provider, MD  aspirin EC 81 MG tablet Take 81 mg by mouth daily.   Yes Historical Provider, MD  atorvastatin (LIPITOR) 10 MG tablet Take 1 tablet (10 mg total) by mouth daily. 07/22/13  Yes Sueanne Margarita, MD  calcitRIOL (ROCALTROL) 0.25 MCG capsule Take 0.25-0.5 mcg by mouth  daily. Take 0.25 mcg daily alternating with 0.5 mcg 06/12/13  Yes Historical Provider, MD  cefdinir (OMNICEF) 300 MG capsule Take 1 capsule (300 mg total) by mouth daily. 08/20/13  Yes Gregor Hams, MD  doxycycline (VIBRAMYCIN) 100 MG capsule Take 1 capsule (100 mg total) by mouth 2 (two) times daily. 08/20/13  Yes Gregor Hams, MD  furosemide (LASIX) 20 MG tablet Take 20 mg by mouth daily as needed. Excess edema   Yes Historical Provider, MD  labetalol (NORMODYNE) 200 MG tablet Take 100 mg by mouth daily.    Yes Historical Provider, MD  LUMIGAN 0.01 % SOLN Place 1 drop into both eyes at bedtime.  10/30/11  Yes Historical Provider, MD  mycophenolate (MYFORTIC) 180 MG EC tablet Take 180 mg by mouth 2 (two) times daily.    Yes Historical Provider, MD  ranitidine (ZANTAC) 150 MG tablet Take 150 mg by mouth daily.    Yes Historical Provider, MD  ranolazine (RANEXA) 1000 MG SR tablet Take 1 tablet (1,000 mg total) by mouth 2 (two) times daily. 07/06/13  Yes Donne Hazel, MD  sodium bicarbonate 650 MG tablet Take 650 mg by mouth 2 (two) times daily.   Yes Historical Provider, MD  tacrolimus (PROGRAF) 1 MG capsule Take 3-4 mg by mouth See admin instructions. 4 caps in am and 3 caps in pm.   Yes Historical Provider, MD  timolol (BETIMOL) 0.5 % ophthalmic solution Place 1 drop into both eyes 2 (two) times daily.   Yes Historical Provider, MD  warfarin (COUMADIN) 2.5 MG tablet Take 1.25-2.5 mg by mouth every evening. Alternating between 1.25mg  and 2.5mg  daily.   Yes Historical Provider, MD    Family History  Family History  Problem Relation Age of Onset  . Hypertension Mother   . Heart disease Mother   . Colon cancer Neg Hx   . Rectal cancer Neg Hx   . Stomach cancer Neg Hx    No family status information on file.     Social History  History   Social History  . Marital Status: Married    Spouse Name: N/A    Number of Children: 3  . Years of Education: N/A   Occupational History  . disabled     Social History Main Topics  . Smoking status: Former Smoker -- 1.00 packs/day for 30 years    Quit date: 04/16/1977  . Smokeless tobacco: Never Used  . Alcohol Use: No  . Drug Use: No  . Sexual Activity: Not on file   Other Topics Concern  . Not on file   Social History Narrative  . No narrative on file     Review of Systems General:  No chills, fever, night sweats or weight changes.  Cardiovascular:  +++ chest pain, dyspnea on  exertion, No edema, orthopnea, palpitations, paroxysmal nocturnal dyspnea. Dermatological: No rash, lesions/masses Respiratory:++ cough, dyspnea Urologic: No hematuria, dysuria Abdominal:   No nausea, vomiting, diarrhea, bright red blood per rectum, melena, or hematemesis Neurologic:  No visual changes, wkns, changes in mental status. All other systems reviewed and are otherwise negative except as noted above.  Physical Exam  Blood pressure 170/70, pulse 77, temperature 98 F (36.7 C), temperature source Oral, resp. rate 18, height 5\' 1"  (1.549 m), weight 172 lb (78.019 kg), SpO2 97.00%.  General: Pleasant, NAD.  Psych: Normal affect. Neuro: Alert and oriented X 3. Moves all extremities spontaneously. HEENT: Normal  Neck: Supple without bruits or JVD. Lungs:  Resp regular and unlabored, CTA. Heart: RRR no s3, s4, + murmurs. Abdomen: Soft, non-tender, non-distended, BS + x 4.  Extremities: No clubbing, cyanosis or edema. DP/PT/Radials 2+ and equal bilaterally.  Labs  No results found for this basename: CKTOTAL, CKMB, TROPONINI,  in the last 72 hours Lab Results  Component Value Date   WBC 5.7 08/24/2013   HGB 9.9* 08/24/2013   HCT 29.9* 08/24/2013   MCV 81.0 08/24/2013   PLT 198 08/24/2013    Recent Labs Lab 08/24/13 0950  NA 132*  K 4.8  CL 97  CO2 19  BUN 28*  CREATININE 2.35*  CALCIUM 9.8  GLUCOSE 102*   Lab Results  Component Value Date   CHOL 181 07/02/2013   HDL 42 07/02/2013   LDLCALC 108* 07/02/2013   TRIG 155* 07/02/2013       Radiology/Studies  Dg Chest 2 View  08/24/2013   CLINICAL DATA:  Chest pain  EXAM: CHEST  2 VIEW  COMPARISON:  08/20/2013  FINDINGS: Heart is borderline in size. Continued left lower lobe airspace opacity concerning for pneumonia. Small left pleural effusion. Increasing patchy airspace opacity on the right.  No acute bony abnormality.  IMPRESSION: Stable patchy left lower lobe airspace disease with small left effusion. Increasing patchy right basilar airspace opacity. Findings concerning for multifocal pneumonia.   Electronically Signed   By: Rolm Baptise M.D.   On: 08/24/2013 10:27   Dg Chest 2 View  08/20/2013   CLINICAL DATA:  Pulmonary embolism.  EXAM: CHEST  2 VIEW  COMPARISON:  06/30/2013.  FINDINGS: The cardiopericardial silhouette is mildly enlarged for projection. There is airspace disease in the LEFT lower lobe obscuring portions of the LEFT heart border. Small LEFT pleural effusion is present. LEFT basilar atelectasis.  Pulmonary vascular congestion is present. Scarring is present in the LEFT mid lung.  Chronic fullness of the pulmonary hila. LEFT pleural effusion appears larger than on prior examinations.  IMPRESSION: 1. LEFT lower lobe airspace disease favored to represent pneumonia or aspiration rather than asymmetric pulmonary edema. Followup in 4-6 weeks to ensure radiographic clearing and exclude an underlying lesion is recommended. 2. Mild cardiomegaly. 3. Pulmonary vascular congestion.   Electronically Signed   By: Dereck Ligas M.D.   On: 08/20/2013 15:12   Nm Pulmonary Perf And Vent  08/20/2013   CLINICAL DATA:  Shortness of breath and chest pain. History of renal transplant. Atrial fibrillation.  EXAM: NUCLEAR MEDICINE VENTILATION - PERFUSION LUNG SCAN  TECHNIQUE: Ventilation images were obtained in multiple projections using inhaled aerosol technetium 99 M DTPA. Perfusion images were obtained in multiple projections after intravenous injection of Tc-28m MAA.  RADIOPHARMACEUTICALS:   40.0 mCi Tc-65m DTPA aerosol and 6.0 mCi Tc-37m MAA  COMPARISON:  Chest radiograph of earlier in the day.  FINDINGS: Ventilation: Heterogeneous  tracer distribution. No wedge-shaped ventilation defects. Vague decreased ventilation to the lingula and left upper lobe, possibly related to airspace disease on today's chest radiograph.  Perfusion: No wedge shaped peripheral perfusion defects to suggest acute pulmonary embolism.  IMPRESSION: No evidence of pulmonary embolism.   Electronically Signed   By: Abigail Miyamoto M.D.   On: 08/20/2013 15:44    2D ECHO 08/07/2013 LV EF: 55% - 60% Study Conclusions - Left ventricle: The cavity size was normal. Wall thickness was normal. Systolic function was normal. The estimated ejection fraction was in the range of 55% to 60%. - Mitral valve: There was mild regurgitation. - Left atrium: The atrium was severely dilated. - Right atrium: The atrium was mildly dilated. - Pulmonary arteries: PA peak pressure: 82 mm Hg (S).    ASSESSMENT AND PLAN  Debbie [name] is a 64 y.o. female with a history of PAF at the time of her renal transplant on chronic coumadin, ESRD s/p renal transplant with CKD stage IV, anti-phospholipid antibody syndrome, esophageal stricture, HTN but no history of coronary artery disease who presented to [facility] today with reccurent chest pain.    Chest pain --Troponin x1 negative, ECG with no acute ST or TW changes -- Will admit to telemtry for observation -- Cycle Troponin and serial ECGs -- Recent admission for CP rule out with abnormal stress test, chosen to treat with medical management due to kidney transplant and CKD and high risk of CIN with cardiac cath. Symptoms concerning for ACS. Will plan for dobutamine stress ECHO tomorrow. -- Will start Isordil 10mg  TID, continue Ranexa. Hold BB for dobutamine stress test tomorrow.   CKD- S/P kidney transplant now with stage 4 CKD -- Creat 2.35 today, which is elevated from baseline. Continue to  monitor   HTN - elevated -- Will discontinue BB for planned dobutamine stress ECHO tomorrow. NPO after midnight. -- Will add PRN hydralazine for SPB >160 or DBP>100  Remote PAF at time of kidney transplant with no reoccurence.  -- Continue coumadin. INR sub-theraputic at 1.89. Will consult pharmacy.   Antiphospholipid syndrome- with hx of blood clot. On coumadin but INR sub-theraputic. WIll order a D-Dimer.   Heart murmur - 2D ECHO with mild MR. EF normal 55-60%  Pulm HTN- PA pressure 82 mm HG by recent ECHO  Dyslipidemia - last LDL in June was 108. With presumed CAD and HTN her LDL goal is <70. Started on Lipitor 10mg  daily  Carotid artery bruit - carotid dopplers done on 08/07/13 with heterogeneous plaque, bilaterally.1-39% bilateral ICA stenosis. Patent vertebral arteries with antegrade flow. Normal subclavian arteries, bilaterally.  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  PNA- recently diagnosed. Continue ABx  SignedPerry Mount, PA-C 08/24/2013, 11:05 AM  Pager [phone]   Patient examined chart reviewed  Discussed care plan with patient and grandchildren Difficult situation with ongoing anginal sounding pain over the last month.  Reviewed myovue  Recently done and to my eye suggested anterior apical ischemia but read as no ischemia She is already on beta blocker and ranexa/  High risk of permanent renal injury with previous transplant And Cr 2.3  Since no ischemia documented on myovue, negative enzymes and no ECG changes favor adding isordil 10 tid to current regimen and doing dobutamine echo in am to see if she has any high risk features with this modality.  She falls and does not think she can walk well on treadmill.  Myovue was done with leixscan.  Have asked Dr Mercy Moore covering hospital consults to see regarding risk of ARF with dye and possible need for cath in near future if she continues to fail medical Rx  Jenkins Rouge

## 2013-08-24 NOTE — Progress Notes (Signed)
Utilization review completed.  

## 2013-08-24 NOTE — ED Notes (Signed)
Pt c/o chest pain at 4 am which dissipated and returned at 0830 - denies cardiac hx, though she has nitro.  Pt has received 324 mg chewable asa and 2 SL nitro and now is pain free.

## 2013-08-24 NOTE — ED Provider Notes (Signed)
Angiocath insertion Performed by: Sherian Maroon  Consent: Verbal consent obtained. Risks and benefits: risks, benefits and alternatives were discussed Time out: Immediately prior to procedure a "time out" was called to verify the correct patient, procedure, equipment, support staff and site/side marked as required.  Preparation: Patient was prepped and draped in the usual sterile fashion.  Vein Location: left upper extremity  Ultrasound Guided  Gauge: 20  Normal blood return and flush without difficulty Patient tolerance: Patient tolerated the procedure well with no immediate complications.     Sherian Maroon, MD 08/24/13 [phone]

## 2013-08-24 NOTE — Progress Notes (Addendum)
ANTICOAGULATION CONSULT NOTE - Initial Consult  Pharmacy Consult for warfarin Indication: atrial fibrillation  Allergies  Allergen Reactions  . Gentamicin Itching    unknown  . Gentamycin [Gentamicin Sulfate] Itching and Swelling  . Penicillins Itching  . Vancomycin Itching and Swelling    Patient Measurements: Height: 5\' 1"  (154.9 cm) Weight: 172 lb (78.019 kg) IBW/kg (Calculated) : 47.8 Heparin Dosing Weight:   Vital Signs: Temp: 98 F (36.7 C) (08/03 0919) Temp src: Oral (08/03 0919) BP: 151/63 mmHg (08/03 1230) Pulse Rate: 68 (08/03 1230)  Labs:  Recent Labs  08/24/13 0950  HGB 9.9*  HCT 29.9*  PLT 198  CREATININE 2.35*    Estimated Creatinine Clearance: 22.9 ml/min (by C-G formula based on Cr of 2.35).   Medical History: Past Medical History  Diagnosis Date  . Kidney transplant as cause of abnormal reaction or later complication   . Hypertension   . Glaucoma   . Colon polyps   . Gall stones 1980  . Hematoma   . Anti-phospholipid antibody syndrome     (Based on hospital discharge summary march 2013)  . Atrial fibrillation   . Umbilical hernia 3267    pt denies this hx on 07/02/2013  . Carpal tunnel syndrome 2003    lt  . Esophageal stricture   . Hemorrhoids   . Renal disorder   . ESRD (end stage renal disease) on dialysis     "til I got my transplant in 2010"  . DVT (deep venous thrombosis) 1990    LLE  . Pneumonia     "twice" (07/02/2013)  . History of blood transfusion     "related to the lupus"  . SLE (systemic lupus erythematosus)   . Arthritis     "hands" (07/02/2013)  . Dyslipidemia 07/22/2013  . Coronary artery disease     Medications:  Prescriptions prior to admission  Medication Sig Dispense Refill  . ALPHAGAN P 0.1 % SOLN Place 1 drop into both eyes at bedtime.       Marland Kitchen aspirin EC 81 MG tablet Take 81 mg by mouth daily.      Marland Kitchen atorvastatin (LIPITOR) Debbie MG tablet Take 1 tablet (Debbie mg total) by mouth daily.  30 tablet  11  .  calcitRIOL (ROCALTROL) 0.25 MCG capsule Take 0.25-0.5 mcg by mouth daily. Take 0.25 mcg daily alternating with 0.5 mcg      . cefdinir (OMNICEF) 300 MG capsule Take 1 capsule (300 mg total) by mouth daily.  7 capsule  0  . doxycycline (VIBRAMYCIN) 100 MG capsule Take 1 capsule (100 mg total) by mouth 2 (two) times daily.  14 capsule  0  . furosemide (LASIX) 20 MG tablet Take 20 mg by mouth daily as needed. Excess edema      . labetalol (NORMODYNE) 200 MG tablet Take 100 mg by mouth daily.       Marland Kitchen LUMIGAN 0.01 % SOLN Place 1 drop into both eyes at bedtime.       . mycophenolate (MYFORTIC) 180 MG EC tablet Take 180 mg by mouth 2 (two) times daily.       . ranitidine (ZANTAC) 150 MG tablet Take 150 mg by mouth daily.       . ranolazine (RANEXA) 1000 MG SR tablet Take 1 tablet (1,000 mg total) by mouth 2 (two) times daily.  60 tablet  0  . sodium bicarbonate 650 MG tablet Take 650 mg by mouth 2 (two) times daily.      Marland Kitchen  tacrolimus (PROGRAF) 1 MG capsule Take 3-4 mg by mouth See admin instructions. 4 caps in am and 3 caps in pm.      . timolol (BETIMOL) 0.5 % ophthalmic solution Place 1 drop into both eyes 2 (two) times daily.      Marland Kitchen warfarin (COUMADIN) 2.5 MG tablet Take 1.25-2.5 mg by mouth every evening. Alternating between 1.25mg  and 2.5mg  daily.        Assessment: 63 yo Debbie [name] on warfarin for AFib.  Also has a history of antiphospholipid syndrome and DVT in 1990.  Admit INR pending.  PTA dose: 1.25 and 2.5 mg on alternating days  Goal of Therapy:  INR 2-3 Monitor platelets by anticoagulation protocol: Yes   Plan:  Will order dose based on admit INR Daily INR Monitor for s/sx bleeding   Hughes Better, PharmD, BCPS Clinical Pharmacist Pager: [phone] 08/24/2013 3:51 PM    Addum>  INR 2.02.  Will give coumadin 2.5 mg today.  Thanks for allowing pharmacy to be a part of this patient's care.  Excell Seltzer, PharmD Clinical Pharmacist, [phone]

## 2013-08-24 NOTE — ED Provider Notes (Signed)
I saw and evaluated the patient, reviewed the resident's note and I agree with the findings and plan.  Left UE iv placed by resident, site intact. Iv appears to be functioning normally.      Mirna Mires, MD 08/24/13 1049

## 2013-08-24 NOTE — Consult Note (Addendum)
Debbie [name] is an 63 y.o. female referred by Dr Johnsie Cancel   Chief Complaint: CKD 4, renal tx HPI: 63yo BF with ESRD SP Cad renal tx 2010 at [facility].  Baseline Scr mid 2's.  Ask to see to discuss renal risks of heart cath if one is needed.  Admitted today for recurrent CP that she describes as retro sternal burning/pressure radiating to her jaw and relieved with NTG.  She is suppose to be on Mag supp but has not been taking them.  Last Mag level low at 1.3.  Past Medical History  Diagnosis Date  . Kidney transplant as cause of abnormal reaction or later complication   . Hypertension   . Glaucoma   . Colon polyps   . Gall stones 1980  . Hematoma   . Anti-phospholipid antibody syndrome     (Based on hospital discharge summary march 2013)  . Atrial fibrillation   . Umbilical hernia 6503    pt denies this hx on 07/02/2013  . Carpal tunnel syndrome 2003    lt  . Esophageal stricture   . Hemorrhoids   . Renal disorder   . ESRD (end stage renal disease) on dialysis     "til I got my transplant in 2010"  . DVT (deep venous thrombosis) 1990    LLE  . Pneumonia     "twice" (07/02/2013)  . History of blood transfusion     "related to the lupus"  . SLE (systemic lupus erythematosus)   . Arthritis     "hands" (07/02/2013)  . Dyslipidemia 07/22/2013  . Coronary artery disease     Past Surgical History  Procedure Laterality Date  . Nephrectomy transplanted organ  05/2008  . Cholecystectomy  1980  . Umbilical hernia repair  1998    pt denies this hx on 07/02/2013  . Hematoma evacuation      "after they took peritoneal catheter out"  . Peritoneal catheter insertion    . Peritoneal catheter removal    . Hernia repair      pt denies this hx on 07/02/2013  . Carpal tunnel release Left 07/2001  . Arteriovenous graft placement    . Thrombectomy and revision of arterioventous (av) goretex  graft Right 07/1999; 04/2002; 09/2002; 12/2005; 09/2007;     upper arm/notes 07/31/1999; 05/18/2002; 10/07/2002;  01/14/2006; 09/14/2007;     Family History  Problem Relation Age of Onset  . Hypertension Mother   . Heart disease Mother   . Colon cancer Neg Hx   . Rectal cancer Neg Hx   . Stomach cancer Neg Hx   No FH of renal Ds  Social History:  reports that she quit smoking about 36 years ago. She has never used smokeless tobacco. She reports that she does not drink alcohol or use illicit drugs.  Lives with her husband in [place].  Allergies:  Allergies  Allergen Reactions  . Gentamicin Itching    unknown  . Gentamycin [Gentamicin Sulfate] Itching and Swelling  . Penicillins Itching  . Vancomycin Itching and Swelling    Medications Prior to Admission  Medication Sig Dispense Refill  . ALPHAGAN P 0.1 % SOLN Place 1 drop into both eyes at bedtime.       Marland Kitchen aspirin EC 81 MG tablet Take 81 mg by mouth daily.      Marland Kitchen atorvastatin (LIPITOR) 10 MG tablet Take 1 tablet (10 mg total) by mouth daily.  30 tablet  11  . calcitRIOL (ROCALTROL) 0.25 MCG capsule Take  0.25-0.5 mcg by mouth daily. Take 0.25 mcg daily alternating with 0.5 mcg      . cefdinir (OMNICEF) 300 MG capsule Take 1 capsule (300 mg total) by mouth daily.  7 capsule  0  . doxycycline (VIBRAMYCIN) 100 MG capsule Take 1 capsule (100 mg total) by mouth 2 (two) times daily.  14 capsule  0  . furosemide (LASIX) 20 MG tablet Take 20 mg by mouth daily as needed. Excess edema      . labetalol (NORMODYNE) 200 MG tablet Take 100 mg by mouth daily.       Marland Kitchen LUMIGAN 0.01 % SOLN Place 1 drop into both eyes at bedtime.       . mycophenolate (MYFORTIC) 180 MG EC tablet Take 180 mg by mouth 2 (two) times daily.       . ranitidine (ZANTAC) 150 MG tablet Take 150 mg by mouth daily.       . ranolazine (RANEXA) 1000 MG SR tablet Take 1 tablet (1,000 mg total) by mouth 2 (two) times daily.  60 tablet  0  . sodium bicarbonate 650 MG tablet Take 650 mg by mouth 2 (two) times daily.      . tacrolimus (PROGRAF) 1 MG capsule Take 3-4 mg by mouth See admin  instructions. 4 caps in am and 3 caps in pm.      . timolol (BETIMOL) 0.5 % ophthalmic solution Place 1 drop into both eyes 2 (two) times daily.      Marland Kitchen warfarin (COUMADIN) 2.5 MG tablet Take 1.25-2.5 mg by mouth every evening. Alternating between 1.25mg  and 2.5mg  daily.         Lab Results: UA: ND   Recent Labs  08/24/13 0950  WBC 5.7  HGB 9.9*  HCT 29.9*  PLT 198   BMET  Recent Labs  08/24/13 0950  NA 132*  K 4.8  CL 97  CO2 19  GLUCOSE 102*  BUN 28*  CREATININE 2.35*  CALCIUM 9.8   LFT No results found for this basename: PROT, ALBUMIN, AST, ALT, ALKPHOS, BILITOT, BILIDIR, IBILI,  in the last 72 hours Dg Chest 2 View  08/24/2013   CLINICAL DATA:  Chest pain  EXAM: CHEST  2 VIEW  COMPARISON:  08/20/2013  FINDINGS: Heart is borderline in size. Continued left lower lobe airspace opacity concerning for pneumonia. Small left pleural effusion. Increasing patchy airspace opacity on the right.  No acute bony abnormality.  IMPRESSION: Stable patchy left lower lobe airspace disease with small left effusion. Increasing patchy right basilar airspace opacity. Findings concerning for multifocal pneumonia.   Electronically Signed   By: Rolm Baptise M.D.   On: 08/24/2013 10:27    ROS: No change in vision No SOB No CP currently No abd pain.  No change in BM Chronic pain in fingers especially Lt index Numbness/tingling of hands and feet No dysuria No F/C/S  PHYSICAL EXAM: Blood pressure 151/63, pulse 68, temperature 98 F (36.7 C), temperature source Oral, resp. rate 16, height 5\' 1"  (1.549 m), weight 78.019 kg (172 lb), SpO2 97.00%. HEENT: [place] EOMI NECK:No JVD LUNGS:Few faint bibasilar crackles CARDIAC:RRR with 3/6 systolic M ABD:+ BS NTND No HSM.  Renal Tx RLQ NT EXT:no edema.  RUA AVG + bruit NEURO:CNI Ox3 No asterixis  Assessment: 1. CP ? Unstable angina 2. CKD 4 in renal tx 3. hypomag 4. Sec HPTH on calcitriol 5. Anemia 6. 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 Meds  reviewed, she is on prednisone 5mg  a d  so will add this 2. Check Mg level and empirically resume mag supp for now 3. Discussed renal risks of Ht cath if one is needed.  I think there is a greater than 50% chance the Scr will increase but then recover but there is also 20-30% chance that renal fx could worsen to point of HD.  She understands these risks and will think about them.   Shariq Puig T 08/24/2013, 3:29 PM

## 2013-08-24 NOTE — ED Provider Notes (Signed)
CSN: [id number]     Arrival date & time 08/24/13  0910 History   First MD Initiated Contact with Patient 08/24/13 0914     Chief Complaint  Patient presents with  . Chest Pain     (Consider location/radiation/quality/duration/timing/severity/associated sxs/prior Treatment) HPI Comments: Patient is a 63 year old female with a past medical history of kidney transplant, hypertension, CAD, lupus, and arthritis who presents with chest pain that started about 4 hours ago. Patient reports around 4am having a sudden onset of chest pain in her left chest that did not radiate. The pain is described as a pressure and squeezing and severe. She denies any associated symptoms. The pain lasted about 10 minutes before resolving. The pain returned twice more with the same characteristics and completely resolved after taking nitro. Patient was diagnosed with pneumonia 4 days ago. She reports associated dyspnea on exertion. No other associated symptoms. Patient called EMS. Patient's Cardiologist is Fransico Him. Most recent echo shows 55-60% LV EF about 2 weeks ago.   Patient is a 63 y.o. female presenting with chest pain.  Chest Pain Associated symptoms: no abdominal pain, no dizziness, no dysphagia, no fatigue, no fever, no nausea, no palpitations, no shortness of breath, not vomiting and no weakness     Past Medical History  Diagnosis Date  . Kidney transplant as cause of abnormal reaction or later complication   . Hypertension   . Glaucoma   . Colon polyps   . Gall stones 1980  . Hematoma   . Anti-phospholipid antibody syndrome     (Based on hospital discharge summary march 2013)  . Atrial fibrillation   . Umbilical hernia 7989    pt denies this hx on 07/02/2013  . Carpal tunnel syndrome 2003    lt  . Esophageal stricture   . Hemorrhoids   . Renal disorder   . ESRD (end stage renal disease) on dialysis     "til I got my transplant in 2010"  . DVT (deep venous thrombosis) 1990    LLE  .  Pneumonia     "twice" (07/02/2013)  . History of blood transfusion     "related to the lupus"  . SLE (systemic lupus erythematosus)   . Arthritis     "hands" (07/02/2013)  . Dyslipidemia 07/22/2013  . Coronary artery disease    Past Surgical History  Procedure Laterality Date  . Nephrectomy transplanted organ  05/2008  . Cholecystectomy  1980  . Umbilical hernia repair  1998    pt denies this hx on 07/02/2013  . Hematoma evacuation      "after they took peritoneal catheter out"  . Peritoneal catheter insertion    . Peritoneal catheter removal    . Hernia repair      pt denies this hx on 07/02/2013  . Carpal tunnel release Left 07/2001  . Arteriovenous graft placement    . Thrombectomy and revision of arterioventous (av) goretex  graft Right 07/1999; 04/2002; 09/2002; 12/2005; 09/2007;     upper arm/notes 07/31/1999; 05/18/2002; 10/07/2002; 01/14/2006; 09/14/2007;    Family History  Problem Relation Age of Onset  . Hypertension Mother   . Heart disease Mother   . Colon cancer Neg Hx   . Rectal cancer Neg Hx   . Stomach cancer Neg Hx    History  Substance Use Topics  . Smoking status: Former Smoker -- 1.00 packs/day for 30 years    Quit date: 04/16/1977  . Smokeless tobacco: Never Used  . Alcohol  Use: No   OB History   Grav Para Term Preterm Abortions TAB SAB Ect Mult Living                 Review of Systems  Constitutional: Negative for fever, chills and fatigue.  HENT: Negative for trouble swallowing.   Eyes: Negative for visual disturbance.  Respiratory: Negative for shortness of breath.   Cardiovascular: Positive for chest pain. Negative for palpitations.  Gastrointestinal: Negative for nausea, vomiting, abdominal pain and diarrhea.  Genitourinary: Negative for dysuria and difficulty urinating.  Musculoskeletal: Negative for arthralgias and neck pain.  Skin: Negative for color change.  Neurological: Negative for dizziness and weakness.  Psychiatric/Behavioral: Negative for  dysphoric mood.      Allergies  Gentamicin; Gentamycin; Penicillins; and Vancomycin  Home Medications   Prior to Admission medications   Medication Sig Start Date End Date Taking? Authorizing Provider  ALPHAGAN P 0.1 % SOLN Place 1 drop into both eyes at bedtime.  10/11/11  Yes Historical Provider, MD  aspirin EC 81 MG tablet Take 81 mg by mouth daily.   Yes Historical Provider, MD  atorvastatin (LIPITOR) 10 MG tablet Take 1 tablet (10 mg total) by mouth daily. 07/22/13  Yes Sueanne Margarita, MD  calcitRIOL (ROCALTROL) 0.25 MCG capsule Take 0.25-0.5 mcg by mouth daily. Take 0.25 mcg daily alternating with 0.5 mcg 06/12/13  Yes Historical Provider, MD  cefdinir (OMNICEF) 300 MG capsule Take 1 capsule (300 mg total) by mouth daily. 08/20/13  Yes Gregor Hams, MD  doxycycline (VIBRAMYCIN) 100 MG capsule Take 1 capsule (100 mg total) by mouth 2 (two) times daily. 08/20/13  Yes Gregor Hams, MD  furosemide (LASIX) 20 MG tablet Take 20 mg by mouth daily as needed. Excess edema   Yes Historical Provider, MD  labetalol (NORMODYNE) 200 MG tablet Take 100 mg by mouth daily.    Yes Historical Provider, MD  LUMIGAN 0.01 % SOLN Place 1 drop into both eyes at bedtime.  10/30/11  Yes Historical Provider, MD  mycophenolate (MYFORTIC) 180 MG EC tablet Take 180 mg by mouth 2 (two) times daily.    Yes Historical Provider, MD  ranitidine (ZANTAC) 150 MG tablet Take 150 mg by mouth daily.    Yes Historical Provider, MD  ranolazine (RANEXA) 1000 MG SR tablet Take 1 tablet (1,000 mg total) by mouth 2 (two) times daily. 07/06/13  Yes Donne Hazel, MD  sodium bicarbonate 650 MG tablet Take 650 mg by mouth 2 (two) times daily.   Yes Historical Provider, MD  tacrolimus (PROGRAF) 1 MG capsule Take 3-4 mg by mouth See admin instructions. 4 caps in am and 3 caps in pm.   Yes Historical Provider, MD  timolol (BETIMOL) 0.5 % ophthalmic solution Place 1 drop into both eyes 2 (two) times daily.   Yes Historical Provider, MD   warfarin (COUMADIN) 2.5 MG tablet Take 1.25-2.5 mg by mouth every evening. Alternating between 1.25mg  and 2.5mg  daily.   Yes Historical Provider, MD   BP 170/70  Pulse 77  Temp(Src) 98 F (36.7 C) (Oral)  Resp 18  Ht 5\' 1"  (1.549 m)  Wt 172 lb (78.019 kg)  BMI 32.52 kg/m2  SpO2 97% Physical Exam  Nursing note and vitals reviewed. Constitutional: She appears well-developed and well-nourished. No distress.  HENT:  Head: Normocephalic and atraumatic.  Eyes: Conjunctivae and EOM are normal.  Neck: Normal range of motion.  Cardiovascular: Normal rate and regular rhythm.  Exam reveals no gallop and  no friction rub.   No murmur heard. Pulmonary/Chest: Effort normal and breath sounds normal. She has no wheezes. She has no rales. She exhibits no tenderness.  Abdominal: Soft. She exhibits no distension. There is no tenderness. There is no rebound.  Musculoskeletal: Normal range of motion.  Neurological: She is alert.  Speech is goal-oriented. Moves limbs without ataxia.   Skin: Skin is warm and dry.  Psychiatric: She has a normal mood and affect. Her behavior is normal.    ED Course  Procedures (including critical care time) Labs Review Labs Reviewed  CBC WITH DIFFERENTIAL  BASIC METABOLIC PANEL  Randolm Idol, ED    Imaging Review Dg Chest 2 View  08/24/2013   CLINICAL DATA:  Chest pain  EXAM: CHEST  2 VIEW  COMPARISON:  08/20/2013  FINDINGS: Heart is borderline in size. Continued left lower lobe airspace opacity concerning for pneumonia. Small left pleural effusion. Increasing patchy airspace opacity on the right.  No acute bony abnormality.  IMPRESSION: Stable patchy left lower lobe airspace disease with small left effusion. Increasing patchy right basilar airspace opacity. Findings concerning for multifocal pneumonia.   Electronically Signed   By: Rolm Baptise M.D.   On: 08/24/2013 10:27     EKG Interpretation None      MDM   Final diagnoses:  Angina pectoris     9:47 AM Patient's labs, EKG, and chest xray pending. Patient is asymptomatic at this time. Patient given nitro in ambulance. Patient will have aspirin here. Vitals stable and patient afebrile.   Patient admitted to cardiology for chest pain.     Alvina Chou, PA-C 08/24/13 1540

## 2013-08-24 NOTE — ED Notes (Signed)
Patient reports she's had 324mg  of aspirin about one hour ago. Verline Lema, [place] notified.

## 2013-08-25 ENCOUNTER — Encounter (HOSPITAL_COMMUNITY): Payer: Self-pay

## 2013-08-25 DIAGNOSIS — N183 Chronic kidney disease, stage 3 unspecified: Secondary | ICD-10-CM

## 2013-08-25 DIAGNOSIS — I209 Angina pectoris, unspecified: Secondary | ICD-10-CM

## 2013-08-25 LAB — COMPREHENSIVE METABOLIC PANEL
ALT: 11 U/L (ref 0–35)
AST: 19 U/L (ref 0–37)
Albumin: 3.3 g/dL — ABNORMAL LOW (ref 3.5–5.2)
Alkaline Phosphatase: 79 U/L (ref 39–117)
Anion gap: 12 (ref 5–15)
BUN: 28 mg/dL — AB (ref 6–23)
CHLORIDE: 101 meq/L (ref 96–112)
CO2: 20 meq/L (ref 19–32)
CREATININE: 2.35 mg/dL — AB (ref 0.50–1.10)
Calcium: 9.2 mg/dL (ref 8.4–10.5)
GFR, EST AFRICAN AMERICAN: 24 mL/min — AB (ref >90)
GFR, EST NON AFRICAN AMERICAN: 21 mL/min — AB (ref >90)
Glucose, Bld: 100 mg/dL — ABNORMAL HIGH (ref 70–99)
Potassium: 5.2 mEq/L (ref 3.7–5.3)
Sodium: 133 mEq/L — ABNORMAL LOW (ref 137–147)
Total Bilirubin: 0.3 mg/dL (ref 0.3–1.2)
Total Protein: 6 g/dL (ref 6.0–8.3)

## 2013-08-25 LAB — CBC
HCT: 27 % — ABNORMAL LOW (ref 36.0–46.0)
Hemoglobin: 9 g/dL — ABNORMAL LOW (ref 12.0–15.0)
MCH: 27.3 pg (ref 26.0–34.0)
MCHC: 33.3 g/dL (ref 30.0–36.0)
MCV: 81.8 fL (ref 78.0–100.0)
PLATELETS: 177 10*3/uL (ref 150–400)
RBC: 3.3 MIL/uL — ABNORMAL LOW (ref 3.87–5.11)
RDW: 14.4 % (ref 11.5–15.5)
WBC: 5.1 10*3/uL (ref 4.0–10.5)

## 2013-08-25 LAB — LIPID PANEL
CHOLESTEROL: 146 mg/dL (ref 0–200)
HDL: 46 mg/dL (ref >39)
LDL Cholesterol: 81 mg/dL (ref 0–99)
Total CHOL/HDL Ratio: 3.2 RATIO
Triglycerides: 97 mg/dL (ref <150)
VLDL: 19 mg/dL (ref 0–40)

## 2013-08-25 LAB — PROTIME-INR
INR: 2.24 — ABNORMAL HIGH (ref 0.00–1.49)
Prothrombin Time: 24.8 seconds — ABNORMAL HIGH (ref 11.6–15.2)

## 2013-08-25 LAB — TROPONIN I: Troponin I: <0.3 ng/mL (ref <0.30)

## 2013-08-25 MED ORDER — CARVEDILOL 3.125 MG PO TABS
3.1250 mg | ORAL_TABLET | Freq: Two times a day (BID) | ORAL | Status: DC
  Administered 2013-08-25 – 2013-08-26 (×2): 3.125 mg via ORAL
  Filled 2013-08-25 (×4): qty 1

## 2013-08-25 MED ORDER — WARFARIN 1.25 MG HALF TABLET
1.2500 mg | ORAL_TABLET | Freq: Once | ORAL | Status: AC
  Administered 2013-08-25: 1.25 mg via ORAL
  Filled 2013-08-25: qty 1

## 2013-08-25 MED ORDER — ATROPINE SULFATE 1 MG/ML IJ SOLN
INTRAMUSCULAR | Status: AC
  Filled 2013-08-25: qty 1

## 2013-08-25 MED ORDER — DOBUTAMINE INFUSION FOR EP/ECHO/NUC (1000 MCG/ML)
0.0000 ug/kg/min | Freq: Once | INTRAVENOUS | Status: AC
  Administered 2013-08-25: 40 ug/kg/min via INTRAVENOUS
  Filled 2013-08-25: qty 250

## 2013-08-25 MED ORDER — RANOLAZINE ER 500 MG PO TB12
500.0000 mg | ORAL_TABLET | Freq: Two times a day (BID) | ORAL | Status: DC
  Administered 2013-08-25 – 2013-08-26 (×2): 500 mg via ORAL
  Filled 2013-08-25 (×4): qty 1

## 2013-08-25 MED ORDER — METOPROLOL TARTRATE 1 MG/ML IV SOLN
INTRAVENOUS | Status: AC
  Filled 2013-08-25: qty 5

## 2013-08-25 NOTE — Progress Notes (Signed)
S: No CP currently O:BP 162/79  Pulse 79  Temp(Src) 98.7 F (37.1 C) (Oral)  Resp 16  Ht 5\' 1"  (1.549 m)  Wt 76.295 kg (168 lb 3.2 oz)  BMI 31.80 kg/m2  SpO2 95% No intake or output data in the 24 hours ending 08/25/13 1147 Weight change:  FKC:LEXNT and alert CVS:RRR 3/6 systolic murmur Resp:clear Abd:+ BS NTND Ext:no edema  RUA AVG + bruit NEURO:CNI Ox3 no asterixis   . aspirin  324 mg Oral NOW   Or  . aspirin  300 mg Rectal NOW  . aspirin EC  81 mg Oral Daily  . atorvastatin  10 mg Oral Daily  . calcitRIOL  0.25 mcg Oral QODAY  . calcitRIOL  0.5 mcg Oral QODAY  . carvedilol  3.125 mg Oral BID WC  . cefUROXime  500 mg Oral Daily  . doxycycline  100 mg Oral BID  . famotidine  20 mg Oral Daily  . isosorbide dinitrate  10 mg Oral TID  . magnesium oxide  400 mg Oral BID  . mycophenolate  180 mg Oral BID  . ranolazine  500 mg Oral BID  . sodium bicarbonate  650 mg Oral BID  . sodium chloride  3 mL Intravenous Q12H  . tacrolimus  3 mg Oral QHS  . tacrolimus  4 mg Oral Daily  . timolol  1 drop Both Eyes BID  . warfarin  1.25 mg Oral ONCE-1800  . Warfarin - Pharmacist Dosing Inpatient   Does not apply q1800   Dg Chest 2 View  08/24/2013   CLINICAL DATA:  Chest pain  EXAM: CHEST  2 VIEW  COMPARISON:  08/20/2013  FINDINGS: Heart is borderline in size. Continued left lower lobe airspace opacity concerning for pneumonia. Small left pleural effusion. Increasing patchy airspace opacity on the right.  No acute bony abnormality.  IMPRESSION: Stable patchy left lower lobe airspace disease with small left effusion. Increasing patchy right basilar airspace opacity. Findings concerning for multifocal pneumonia.   Electronically Signed   By: Rolm Baptise M.D.   On: 08/24/2013 10:27   BMET    Component Value Date/Time   NA 133* 08/25/2013 0150   K 5.2 08/25/2013 0150   CL 101 08/25/2013 0150   CO2 20 08/25/2013 0150   GLUCOSE 100* 08/25/2013 0150   BUN 28* 08/25/2013 0150   CREATININE 2.35*  08/25/2013 0150   CALCIUM 9.2 08/25/2013 0150   GFRNONAA 21* 08/25/2013 0150   GFRAA 24* 08/25/2013 0150   CBC    Component Value Date/Time   WBC 5.1 08/25/2013 0150   RBC 3.30* 08/25/2013 0150   HGB 9.0* 08/25/2013 0150   HCT 27.0* 08/25/2013 0150   PLT 177 08/25/2013 0150   MCV 81.8 08/25/2013 0150   MCH 27.3 08/25/2013 0150   MCHC 33.3 08/25/2013 0150   RDW 14.4 08/25/2013 0150   LYMPHSABS 1.7 08/24/2013 0950   MONOABS 0.4 08/24/2013 0950   EOSABS 0.1 08/24/2013 0950   BASOSABS 0.0 08/24/2013 0950     Assessment: 1. CP 2. CKD 4 in renal Tx 3. Hypomag 4. Sec HPTH on calcitrio 5. Anemia 6. RTA on bicarb  Plan: 1. Note plans not to do stress test and treat medically.   2. I discussed the need for her to take her Mg supp 3.  Since no heart cath planned and no acute renal issues, I will sign off.  If plans change please let me know.   Lucely Leard T

## 2013-08-25 NOTE — Progress Notes (Signed)
ANTICOAGULATION CONSULT NOTE - Initial Consult  Pharmacy Consult for warfarin Indication: atrial fibrillation  Allergies  Allergen Reactions  . Gentamicin Itching    unknown  . Gentamycin [Gentamicin Sulfate] Itching and Swelling  . Penicillins Itching  . Vancomycin Itching and Swelling    Patient Measurements: Height: 5\' 1"  (154.9 cm) Weight: 168 lb 3.2 oz (76.295 kg) IBW/kg (Calculated) : 47.8 Heparin Dosing Weight:   Vital Signs: Temp: 98.7 F (37.1 C) (08/04 0532) Temp src: Oral (08/04 0532) BP: 162/79 mmHg (08/04 0852) Pulse Rate: 79 (08/04 0852)  Labs:  Recent Labs  08/24/13 0950 08/24/13 1540 08/24/13 2050 08/25/13 0150  HGB 9.9*  --   --  9.0*  HCT 29.9*  --   --  27.0*  PLT 198  --   --  177  LABPROT  --  22.9*  --  24.8*  INR  --  2.02*  --  2.24*  CREATININE 2.35*  --   --  2.35*  TROPONINI  --  <0.30 <0.30 <0.30    Estimated Creatinine Clearance: 22.6 ml/min (by C-G formula based on Cr of 2.35).   Medical History: Past Medical History  Diagnosis Date  . Kidney transplant as cause of abnormal reaction or later complication   . Hypertension   . Glaucoma   . Colon polyps   . Gall stones 1980  . Hematoma   . Anti-phospholipid antibody syndrome     (Based on hospital discharge summary march 2013)  . Atrial fibrillation   . Umbilical hernia 9381    pt denies this hx on 07/02/2013  . Carpal tunnel syndrome 2003    lt  . Esophageal stricture   . Hemorrhoids   . Renal disorder   . ESRD (end stage renal disease) on dialysis     "til I got my transplant in 2010"  . DVT (deep venous thrombosis) 1990    LLE  . Pneumonia     "twice" (07/02/2013)  . History of blood transfusion     "related to the lupus"  . SLE (systemic lupus erythematosus)   . Arthritis     "hands" (07/02/2013)  . Dyslipidemia 07/22/2013  . Coronary artery disease     Medications:  Prescriptions prior to admission  Medication Sig Dispense Refill  . ALPHAGAN P 0.1 % SOLN  Place 1 drop into both eyes at bedtime.       Marland Kitchen aspirin EC 81 MG tablet Take 81 mg by mouth daily.      Marland Kitchen atorvastatin (LIPITOR) 10 MG tablet Take 1 tablet (10 mg total) by mouth daily.  30 tablet  11  . calcitRIOL (ROCALTROL) 0.25 MCG capsule Take 0.25-0.5 mcg by mouth daily. Take 0.25 mcg daily alternating with 0.5 mcg      . cefdinir (OMNICEF) 300 MG capsule Take 1 capsule (300 mg total) by mouth daily.  7 capsule  0  . doxycycline (VIBRAMYCIN) 100 MG capsule Take 1 capsule (100 mg total) by mouth 2 (two) times daily.  14 capsule  0  . furosemide (LASIX) 20 MG tablet Take 20 mg by mouth daily as needed. Excess edema      . labetalol (NORMODYNE) 200 MG tablet Take 100 mg by mouth daily.       Marland Kitchen LUMIGAN 0.01 % SOLN Place 1 drop into both eyes at bedtime.       . mycophenolate (MYFORTIC) 180 MG EC tablet Take 180 mg by mouth 2 (two) times daily.       Marland Kitchen  ranitidine (ZANTAC) 150 MG tablet Take 150 mg by mouth daily.       . ranolazine (RANEXA) 1000 MG SR tablet Take 1 tablet (1,000 mg total) by mouth 2 (two) times daily.  60 tablet  0  . sodium bicarbonate 650 MG tablet Take 650 mg by mouth 2 (two) times daily.      . tacrolimus (PROGRAF) 1 MG capsule Take 3-4 mg by mouth See admin instructions. 4 caps in am and 3 caps in pm.      . timolol (BETIMOL) 0.5 % ophthalmic solution Place 1 drop into both eyes 2 (two) times daily.      Marland Kitchen warfarin (COUMADIN) 2.5 MG tablet Take 1.25-2.5 mg by mouth every evening. Alternating between 1.25mg  and 2.5mg  daily.        Assessment: 62 yo female on warfarin for AFib.  Also has a history of antiphospholipid syndrome and DVT in 1990.  INR therapeutic 2.24.  PTA dose: 1.25 and 2.5 mg on alternating days   Goal of Therapy:  INR 2-3 Monitor platelets by anticoagulation protocol: Yes    Plan:  Warfarin 1.25 mg po x1 Daily INR Monitor for s/sx bleeding   Hughes Better, PharmD, BCPS Clinical Pharmacist Pager: [phone] 08/25/2013 10:29 AM

## 2013-08-25 NOTE — Progress Notes (Signed)
Patient Name: Debbie [name] Date of Encounter: 08/25/2013     Active Problems:   Chest pain    SUBJECTIVE  One episode of chest pain this morning, lasted 5 minutes. No significant SOB.  CURRENT MEDS . aspirin  324 mg Oral NOW   Or  . aspirin  300 mg Rectal NOW  . aspirin EC  81 mg Oral Daily  . atorvastatin  10 mg Oral Daily  . calcitRIOL  0.25 mcg Oral QODAY  . calcitRIOL  0.5 mcg Oral QODAY  . cefUROXime  500 mg Oral Daily  . doxycycline  100 mg Oral BID  . famotidine  20 mg Oral Daily  . isosorbide dinitrate  10 mg Oral TID  . magnesium oxide  400 mg Oral BID  . mycophenolate  180 mg Oral BID  . ranolazine  1,000 mg Oral BID  . sodium bicarbonate  650 mg Oral BID  . sodium chloride  3 mL Intravenous Q12H  . tacrolimus  3 mg Oral QHS  . tacrolimus  4 mg Oral Daily  . timolol  1 drop Both Eyes BID  . Warfarin - Pharmacist Dosing Inpatient   Does not apply q1800    OBJECTIVE  Filed Vitals:   08/24/13 1342 08/24/13 2051 08/25/13 0532 08/25/13 0735  BP: 154/78 150/57 126/58 136/60  Pulse: 77 75 71 68  Temp: 97.5 F (36.4 C) 97.7 F (36.5 C) 98.7 F (37.1 C)   TempSrc: Oral Oral Oral   Resp: 16 16 16    Height:      Weight:   168 lb 3.2 oz (76.295 kg)   SpO2: 94% 98% 93%    No intake or output data in the 24 hours ending 08/25/13 0857 Filed Weights   08/24/13 0919 08/25/13 0532  Weight: 172 lb (78.019 kg) 168 lb 3.2 oz (76.295 kg)    PHYSICAL EXAM  General: Pleasant, NAD. Neuro: Alert and oriented X 3. Moves all extremities spontaneously. Psych: Normal affect. HEENT:  Normal  Neck: Supple without bruits or JVD. Lungs:  Resp regular and unlabored, CTA. Heart: RRR no s3, s4. 1/6 systolic murmur Abdomen: Soft, non-tender, non-distended, BS + x 4.  Extremities: No clubbing, cyanosis or edema. DP/PT/Radials 2+ and equal bilaterally.  Accessory Clinical Findings  CBC  Recent Labs  08/24/13 0950 08/25/13 0150  WBC 5.7 5.1  NEUTROABS 3.5  --     HGB 9.9* 9.0*  HCT 29.9* 27.0*  MCV 81.0 81.8  PLT 198 761   Basic Metabolic Panel  Recent Labs  08/24/13 0950 08/24/13 1540 08/25/13 0150  NA 132*  --  133*  K 4.8  --  5.2  CL 97  --  101  CO2 19  --  20  GLUCOSE 102*  --  100*  BUN 28*  --  28*  CREATININE 2.35*  --  2.35*  CALCIUM 9.8  --  9.2  MG  --  1.2*  --   PHOS  --  3.2  --    Liver Function Tests  Recent Labs  08/25/13 0150  AST 19  ALT 11  ALKPHOS 79  BILITOT 0.3  PROT 6.0  ALBUMIN 3.3*   Cardiac Enzymes  Recent Labs  08/24/13 1540 08/24/13 2050 08/25/13 0150  TROPONINI <0.30 <0.30 <0.30   D-Dimer  Recent Labs  08/24/13 1540  DDIMER 0.40   Hemoglobin A1C  Recent Labs  08/24/13 1540  HGBA1C 5.4   Fasting Lipid Panel  Recent Labs  08/25/13  0150  CHOL 146  HDL 46  LDLCALC 81  TRIG 97  CHOLHDL 3.2   Thyroid Function Tests  Recent Labs  08/24/13 2050  TSH 5.730*    TELE  NSR  ECG  NSR with HR 70s, no significant ST-T wave changes  Radiology/Studies  Dg Chest 2 View  08/24/2013   CLINICAL DATA:  Chest pain  EXAM: CHEST  2 VIEW  COMPARISON:  08/20/2013  FINDINGS: Heart is borderline in size. Continued left lower lobe airspace opacity concerning for pneumonia. Small left pleural effusion. Increasing patchy airspace opacity on the right.  No acute bony abnormality.  IMPRESSION: Stable patchy left lower lobe airspace disease with small left effusion. Increasing patchy right basilar airspace opacity. Findings concerning for multifocal pneumonia.   Electronically Signed   By: Rolm Baptise M.D.   On: 08/24/2013 10:27   Dg Chest 2 View  08/20/2013   CLINICAL DATA:  Pulmonary embolism.  EXAM: CHEST  2 VIEW  COMPARISON:  06/30/2013.  FINDINGS: The cardiopericardial silhouette is mildly enlarged for projection. There is airspace disease in the LEFT lower lobe obscuring portions of the LEFT heart border. Small LEFT pleural effusion is present. LEFT basilar atelectasis.  Pulmonary  vascular congestion is present. Scarring is present in the LEFT mid lung.  Chronic fullness of the pulmonary hila. LEFT pleural effusion appears larger than on prior examinations.  IMPRESSION: 1. LEFT lower lobe airspace disease favored to represent pneumonia or aspiration rather than asymmetric pulmonary edema. Followup in 4-6 weeks to ensure radiographic clearing and exclude an underlying lesion is recommended. 2. Mild cardiomegaly. 3. Pulmonary vascular congestion.   Electronically Signed   By: Dereck Ligas M.D.   On: 08/20/2013 15:12   Nm Pulmonary Perf And Vent  08/20/2013   CLINICAL DATA:  Shortness of breath and chest pain. History of renal transplant. Atrial fibrillation.  EXAM: NUCLEAR MEDICINE VENTILATION - PERFUSION LUNG SCAN  TECHNIQUE: Ventilation images were obtained in multiple projections using inhaled aerosol technetium 99 M DTPA. Perfusion images were obtained in multiple projections after intravenous injection of Tc-69m MAA.  RADIOPHARMACEUTICALS:  40.0 mCi Tc-6m DTPA aerosol and 6.0 mCi Tc-35m MAA  COMPARISON:  Chest radiograph of earlier in the day.  FINDINGS: Ventilation: Heterogeneous tracer distribution. No wedge-shaped ventilation defects. Vague decreased ventilation to the lingula and left upper lobe, possibly related to airspace disease on today's chest radiograph.  Perfusion: No wedge shaped peripheral perfusion defects to suggest acute pulmonary embolism.  IMPRESSION: No evidence of pulmonary embolism.   Electronically Signed   By: Abigail Miyamoto M.D.   On: 08/20/2013 15:44    ASSESSMENT AND PLAN  1. Chest pain  - negative serial troponin  - recent lexiscan myoview 07/02/2013 EF 54%, evidence of subendocardial scar and possible attenuation, mild distal anterior/apical ischemia  - high risk of contrast nephropathy with permanent renal injury with h/o renal transplant and Cr >2  - Dicussed with Dr. Haroldine Laws and Dr. Johnsie Cancel, Dobutamine echo cancelled given elevated pulm  pressure  - medical management for now, continue isodil, possible discharge tomorrow am if no symptom  2.CKD s/p kidney transplant, Stage 4  - nephrology consulted   3. HTN  4. Remote PAF at time of kidney transplant  5. Antiphospholipid syndrome with h/o blood clot, on coumadin  6. Pulm HTN: PA pressure 82 mm HG by recent ECHO  7. Heart murmur  Signed, Almyra Deforest PA-C Pager: [phone]   Patient seen and examined. Agree with assessment and plan. No chest  pain presently. Recent myoview in [place]. Nitrates added at low dose, can titrate tomorrow. With renal insufficiency will decrease ranexa to 500 mg bid dosing rather than 1000 mg bid. Will add carvedilol at 3.125 mg bid and titrate as BP and BP allow.   Troy Sine, MD, [facility] 08/25/2013 10:34 AM

## 2013-08-25 NOTE — ED Provider Notes (Signed)
Medical screening examination/treatment/procedure(s) were conducted as a shared visit with non-physician practitioner(s) and myself.  I personally evaluated the patient during the encounter.  Pt with hx renal transplant c/o cp. No fever. No sob. Cp currently resolved. Ecg. Labs.  Cxr.  Cardiology consulted re admission.    Mirna Mires, MD 08/25/13 [phone]

## 2013-08-25 NOTE — Progress Notes (Signed)
  I had long talk with Ms. Debbie [name] about her pulmonary HTN on echo as well as her CP and results of her recent stress test.   We discussed the risks and indication of right heart cath (to further evaluate pulmonary HTN) and coronary angio (to evaluate coronary arteries and CP).    She flatly refuses any invasive w/u at this time and wants to continue with medical therapy. She will let us know if she reconsiders.   Benay Spice 4:46 PM

## 2013-08-26 DIAGNOSIS — Z7901 Long term (current) use of anticoagulants: Secondary | ICD-10-CM

## 2013-08-26 LAB — PROTIME-INR
INR: 2.05 — AB (ref 0.00–1.49)
Prothrombin Time: 23.1 seconds — ABNORMAL HIGH (ref 11.6–15.2)

## 2013-08-26 MED ORDER — MAGNESIUM OXIDE 400 (241.3 MG) MG PO TABS: 400.0000 mg | ORAL_TABLET | Freq: Two times a day (BID) | ORAL | Status: DC

## 2013-08-26 MED ORDER — RANOLAZINE ER 500 MG PO TB12: 500.0000 mg | ORAL_TABLET | Freq: Two times a day (BID) | ORAL | Status: DC

## 2013-08-26 MED ORDER — CARVEDILOL 3.125 MG PO TABS: 3.1250 mg | ORAL_TABLET | Freq: Two times a day (BID) | ORAL | Status: DC

## 2013-08-26 MED ORDER — NITROGLYCERIN 0.4 MG SL SUBL: 0.4000 mg | SUBLINGUAL_TABLET | SUBLINGUAL | Status: DC | PRN

## 2013-08-26 MED ORDER — ISOSORBIDE DINITRATE 10 MG PO TABS: 10.0000 mg | ORAL_TABLET | Freq: Three times a day (TID) | ORAL | Status: DC

## 2013-08-26 NOTE — Discharge Summary (Signed)
CARDIOLOGY DISCHARGE SUMMARY   Patient ID: Debbie [name] MRN: [id number] DOB/AGE: [birth year] 63 y.o.  Admit date: 08/24/2013 Discharge date: 08/26/2013  PCP: Placido Sou, MD Primary Cardiologist: Dr. Radford Pax  Primary Discharge Diagnosis:  Chest pain, medical therapy for angina Secondary Discharge Diagnosis:  Chronic kidney disease, stage IV, S./P. renal transplant Chronic anticoagulation with Coumadin; antiphospholipid syndrome History of PAF  Consults: Nephrology  Procedures:   Hospital Course: Debbie [name] is a 63 y.o. female with a possible history of CAD. She had a recent stress test that showed mild ischemia but was felt to be low risk. Because of her poor renal function, the decision was made to treat medically. She had recurrent chest pain and came to the hospital where she was admitted for further evaluation and treatment.  She was started on nitrates. Her chest pain resolved. Initially, a dobutamine stress echo was planned. However, on review of her 2-D echocardiogram, significant pulmonary hypertension was noted. Therefore the decision was made to cancel a dobutamine stress echo.   Dr. Haroldine Laws reviewed information about her pulmonary hypertension and the indications as well as the risks of a right heart cath were discussed with the patient. The risks and indications for a left heart catheterization to evaluate her coronary arteries were also discussed. The patient is currently refusing both procedures and wants to continue medical therapy.  A renal consult was called to help assess the risks of worsening kidney function with dye administration. She was seen by Dr. Mercy Moore, who felt there was a greater than 50% chance her serum creatinine would increase but then recover. There was also a 20-30% chance that renal function would worsen to the point of hemodialysis. Currently, no dye studies are planned.  On 08/05, she was seen by Dr. Claiborne Billings and all data were  reviewed. She is pain-free on her current medical regimen. She is ambulating without chest pain or shortness of breath. No further inpatient workup is indicated and she is considered stable for discharge, to follow up as an outpatient.  Labs:   Lab Results  Component Value Date   WBC 5.1 08/25/2013   HGB 9.0* 08/25/2013   HCT 27.0* 08/25/2013   MCV 81.8 08/25/2013   PLT 177 08/25/2013     Recent Labs Lab 08/25/13 0150  NA 133*  K 5.2  CL 101  CO2 20  BUN 28*  CREATININE 2.35*  CALCIUM 9.2  PROT 6.0  BILITOT 0.3  ALKPHOS 79  ALT 11  AST 19  GLUCOSE 100*    Recent Labs  08/24/13 1540 08/24/13 2050 08/25/13 0150  TROPONINI <0.30 <0.30 <0.30   Lipid Panel     Component Value Date/Time   CHOL 146 08/25/2013 0150   TRIG 97 08/25/2013 0150   HDL 46 08/25/2013 0150   CHOLHDL 3.2 08/25/2013 0150   VLDL 19 08/25/2013 0150   LDLCALC 81 08/25/2013 0150    Recent Labs  08/26/13 0920  INR 2.05*      Radiology: Dg Chest 2 View 08/24/2013   CLINICAL DATA:  Chest pain  EXAM: CHEST  2 VIEW  COMPARISON:  08/20/2013  FINDINGS: Heart is borderline in size. Continued left lower lobe airspace opacity concerning for pneumonia. Small left pleural effusion. Increasing patchy airspace opacity on the right.  No acute bony abnormality.  IMPRESSION: Stable patchy left lower lobe airspace disease with small left effusion. Increasing patchy right basilar airspace opacity. Findings concerning for multifocal pneumonia.   Electronically Signed  By: Rolm Baptise M.D.   On: 08/24/2013 10:27    EKG: A 5 2015 Sinus rhythm Vent. rate 67 BPM PR interval 162 ms QRS duration 96 ms QT/QTc 418/441 ms P-R-T axes 62 59 15   FOLLOW UP PLANS AND APPOINTMENTS Allergies  Allergen Reactions  . Gentamicin Itching    unknown  . Gentamycin [Gentamicin Sulfate] Itching and Swelling  . Penicillins Itching  . Vancomycin Itching and Swelling     Medication List    STOP taking these medications       labetalol 200 MG  tablet  Commonly known as:  NORMODYNE      TAKE these medications       ALPHAGAN P 0.1 % Soln  Generic drug:  brimonidine  Place 1 drop into both eyes at bedtime.     aspirin EC 81 MG tablet  Take 81 mg by mouth daily.     atorvastatin 10 MG tablet  Commonly known as:  LIPITOR  Take 1 tablet (10 mg total) by mouth daily.     calcitRIOL 0.25 MCG capsule  Commonly known as:  ROCALTROL  Take 0.25-0.5 mcg by mouth daily. Take 0.25 mcg daily alternating with 0.5 mcg     carvedilol 3.125 MG tablet  Commonly known as:  COREG  Take 1 tablet (3.125 mg total) by mouth 2 (two) times daily with a meal.     cefdinir 300 MG capsule  Commonly known as:  OMNICEF  Take 1 capsule (300 mg total) by mouth daily.     doxycycline 100 MG capsule  Commonly known as:  VIBRAMYCIN  Take 1 capsule (100 mg total) by mouth 2 (two) times daily.     furosemide 20 MG tablet  Commonly known as:  LASIX  Take 20 mg by mouth daily as needed. Excess edema     isosorbide dinitrate 10 MG tablet  Commonly known as:  ISORDIL  Take 1 tablet (10 mg total) by mouth 3 (three) times daily.     LUMIGAN 0.01 % Soln  Generic drug:  bimatoprost  Place 1 drop into both eyes at bedtime.     magnesium oxide 400 (241.3 MG) MG tablet  Commonly known as:  MAG-OX  Take 1 tablet (400 mg total) by mouth 2 (two) times daily.     mycophenolate 180 MG EC tablet  Commonly known as:  MYFORTIC  Take 180 mg by mouth 2 (two) times daily.     nitroGLYCERIN 0.4 MG SL tablet  Commonly known as:  NITROSTAT  Place 1 tablet (0.4 mg total) under the tongue every 5 (five) minutes x 3 doses as needed for chest pain.     ranitidine 150 MG tablet  Commonly known as:  ZANTAC  Take 150 mg by mouth daily.     ranolazine 500 MG 12 hr tablet  Commonly known as:  RANEXA  Take 1 tablet (500 mg total) by mouth 2 (two) times daily.     sodium bicarbonate 650 MG tablet  Take 650 mg by mouth 2 (two) times daily.     tacrolimus 1 MG  capsule  Commonly known as:  PROGRAF  Take 3-4 mg by mouth See admin instructions. 4 caps in am and 3 caps in pm.     timolol 0.5 % ophthalmic solution  Commonly known as:  BETIMOL  Place 1 drop into both eyes 2 (two) times daily.     warfarin 2.5 MG tablet  Commonly known as:  COUMADIN  Take  1.25-2.5 mg by mouth every evening. Alternating between 1.25mg  and 2.5mg  daily.        Discharge Instructions   Diet - low sodium heart healthy    Complete by:  As directed      Increase activity slowly    Complete by:  As directed           Follow-up Information   Follow up with Erlene Quan, PA-C On 09/16/2013. (See at 8:30 am, at [facility]. [place]. [address], [address], [place]. Phone [phone])    Specialty:  Cardiology   Contact information:   [address] [place] [address] [phone]       BRING ALL MEDICATIONS WITH YOU TO FOLLOW UP APPOINTMENTS  Time spent with patient to include physician time: 42 min Signed: Rosaria Ferries, PA-C 08/26/2013, 11:53 AM Co-Sign MD

## 2013-08-26 NOTE — Progress Notes (Signed)
ANTICOAGULATION CONSULT NOTE - [place] for warfarin Indication: atrial fibrillation  Allergies  Allergen Reactions  . Gentamicin Itching    unknown  . Gentamycin [Gentamicin Sulfate] Itching and Swelling  . Penicillins Itching  . Vancomycin Itching and Swelling    Patient Measurements: Height: 5\' 1"  (154.9 cm) Weight: 169 lb 12.8 oz (77.021 kg) IBW/kg (Calculated) : 47.8 Heparin Dosing Weight:   Vital Signs: Temp: 98.2 F (36.8 C) (08/05 0456) Temp src: Oral (08/05 0456) BP: 138/58 mmHg (08/05 1050) Pulse Rate: 63 (08/05 1050)  Labs:  Recent Labs  08/24/13 0950 08/24/13 1540 08/24/13 2050 08/25/13 0150 08/26/13 0920  HGB 9.9*  --   --  9.0*  --   HCT 29.9*  --   --  27.0*  --   PLT 198  --   --  177  --   LABPROT  --  22.9*  --  24.8* 23.1*  INR  --  2.02*  --  2.24* 2.05*  CREATININE 2.35*  --   --  2.35*  --   TROPONINI  --  <0.30 <0.30 <0.30  --     Estimated Creatinine Clearance: 22.7 ml/min (by C-G formula based on Cr of 2.35).   Medical History: Past Medical History  Diagnosis Date  . Kidney transplant as cause of abnormal reaction or later complication   . Hypertension   . Colon polyps   . Gall stones 1980  . Anti-phospholipid antibody syndrome     (Based on hospital discharge summary march 2013)  . Atrial fibrillation   . Carpal tunnel syndrome 2003    lt  . Esophageal stricture   . Hemorrhoids   . Renal disorder   . ESRD (end stage renal disease) on dialysis     "til I got my transplant in 2010"  . DVT (deep venous thrombosis) 1990    LLE  . History of blood transfusion     "related to the lupus"  . Dyslipidemia 07/22/2013  . Coronary artery disease   . Heart murmur   . Pneumonia     "3 times now" (08/25/2013)  . GERD (gastroesophageal reflux disease)   . Arthritis     "fingers" (08/25/2013)  . Glaucoma, left eye     Medications:  Prescriptions prior to admission  Medication Sig Dispense Refill  .  ALPHAGAN P 0.1 % SOLN Place 1 drop into both eyes at bedtime.       Marland Kitchen aspirin EC 81 MG tablet Take 81 mg by mouth daily.      Marland Kitchen atorvastatin (LIPITOR) 10 MG tablet Take 1 tablet (10 mg total) by mouth daily.  30 tablet  11  . calcitRIOL (ROCALTROL) 0.25 MCG capsule Take 0.25-0.5 mcg by mouth daily. Take 0.25 mcg daily alternating with 0.5 mcg      . cefdinir (OMNICEF) 300 MG capsule Take 1 capsule (300 mg total) by mouth daily.  7 capsule  0  . doxycycline (VIBRAMYCIN) 100 MG capsule Take 1 capsule (100 mg total) by mouth 2 (two) times daily.  14 capsule  0  . furosemide (LASIX) 20 MG tablet Take 20 mg by mouth daily as needed. Excess edema      . labetalol (NORMODYNE) 200 MG tablet Take 100 mg by mouth daily.       Marland Kitchen LUMIGAN 0.01 % SOLN Place 1 drop into both eyes at bedtime.       . mycophenolate (MYFORTIC) 180 MG EC tablet Take 180 mg by  mouth 2 (two) times daily.       . ranitidine (ZANTAC) 150 MG tablet Take 150 mg by mouth daily.       . sodium bicarbonate 650 MG tablet Take 650 mg by mouth 2 (two) times daily.      . tacrolimus (PROGRAF) 1 MG capsule Take 3-4 mg by mouth See admin instructions. 4 caps in am and 3 caps in pm.      . timolol (BETIMOL) 0.5 % ophthalmic solution Place 1 drop into both eyes 2 (two) times daily.      Marland Kitchen warfarin (COUMADIN) 2.5 MG tablet Take 1.25-2.5 mg by mouth every evening. Alternating between 1.25mg  and 2.5mg  daily.      . [DISCONTINUED] ranolazine (RANEXA) 1000 MG SR tablet Take 1 tablet (1,000 mg total) by mouth 2 (two) times daily.  60 tablet  0    Assessment: 63 yo female on warfarin for AFib.  Also has a history of antiphospholipid syndrome and DVT in 1990.  INR therapeutic 2.05  PTA dose: 1.25 and 2.5 mg on alternating days  Noted plans to d/c patient today.   Goal of Therapy:  INR 2-3 Monitor platelets by anticoagulation protocol: Yes    Plan:  Continue Coumadin at previous home dose.  Uvaldo Rising, BCPS  Clinical  Pharmacist Pager [phone]  08/26/2013 12:33 PM

## 2013-08-26 NOTE — Progress Notes (Addendum)
Subjective:  Feels better; no chest pain.  Objective:   Vital Signs in the last 24 hours: Temp:  [98.1 F (36.7 C)-98.2 F (36.8 C)] 98.2 F (36.8 C) (08/05 0456) Pulse Rate:  [65-73] 72 (08/05 0456) Resp:  [16] 16 (08/05 0456) BP: (135-156)/(49-73) 136/73 mmHg (08/05 0456) SpO2:  [92 %-100 %] 100 % (08/05 0456) Weight:  [169 lb 12.8 oz (77.021 kg)] 169 lb 12.8 oz (77.021 kg) (08/05 0456)  Intake/Output from previous day: 08/04 0701 - 08/05 0700 In: 260 [P.O.:260] Out: -   Medications: . aspirin EC  81 mg Oral Daily  . atorvastatin  10 mg Oral Daily  . calcitRIOL  0.25 mcg Oral QODAY  . calcitRIOL  0.5 mcg Oral QODAY  . carvedilol  3.125 mg Oral BID WC  . cefUROXime  500 mg Oral Daily  . doxycycline  100 mg Oral BID  . famotidine  20 mg Oral Daily  . isosorbide dinitrate  10 mg Oral TID  . magnesium oxide  400 mg Oral BID  . mycophenolate  180 mg Oral BID  . ranolazine  500 mg Oral BID  . sodium bicarbonate  650 mg Oral BID  . sodium chloride  3 mL Intravenous Q12H  . tacrolimus  3 mg Oral QHS  . tacrolimus  4 mg Oral Daily  . timolol  1 drop Both Eyes BID  . Warfarin - Pharmacist Dosing Inpatient   Does not apply q1800       Physical Exam:   General appearance: alert, cooperative and no distress Neck: no JVD, supple, symmetrical, trachea midline and thyroid not enlarged, symmetric, no tenderness/mass/nodules Lungs: slightly decreased at bases withou wheezing. Heart: regular rate and rhythm; 1/6 sem, whiff of AR Extremities: no edema, redness or tenderness in the calves or thighs Neurologic: Grossly normal   Rate: 62  Rhythm: normal sinus rhythm  Lab Results:   Recent Labs  08/24/13 0950 08/25/13 0150  NA 132* 133*  K 4.8 5.2  CL 97 101  CO2 19 20  GLUCOSE 102* 100*  BUN 28* 28*  CREATININE 2.35* 2.35*     Recent Labs  08/24/13 2050 08/25/13 0150  TROPONINI <0.30 <0.30    Hepatic Function Panel  Recent Labs  08/25/13 0150  PROT  6.0  ALBUMIN 3.3*  AST 19  ALT 11  ALKPHOS 79  BILITOT 0.3    Recent Labs  08/25/13 0150  INR 2.24*   BNP (last 3 results) No results found for this basename: PROBNP,  in the last 8760 hours  Lipid Panel     Component Value Date/Time   CHOL 146 08/25/2013 0150   TRIG 97 08/25/2013 0150   HDL 46 08/25/2013 0150   CHOLHDL 3.2 08/25/2013 0150   VLDL 19 08/25/2013 0150   LDLCALC 81 08/25/2013 0150      Imaging:  Dg Chest 2 View  08/24/2013   CLINICAL DATA:  Chest pain  EXAM: CHEST  2 VIEW  COMPARISON:  08/20/2013  FINDINGS: Heart is borderline in size. Continued left lower lobe airspace opacity concerning for pneumonia. Small left pleural effusion. Increasing patchy airspace opacity on the right.  No acute bony abnormality.  IMPRESSION: Stable patchy left lower lobe airspace disease with small left effusion. Increasing patchy right basilar airspace opacity. Findings concerning for multifocal pneumonia.   Electronically Signed   By: Rolm Baptise M.D.   On: 08/24/2013 10:27      Assessment/Plan:   Active Problems:   Chest pain  1. Chest pain  - negative serial troponin  - recent lexiscan myoview 07/02/2013 EF 54%, evidence of subendocardial scar and possible attenuation, mild distal anterior/apical ischemia.  - no recurrent chest pain 2.CKD s/p kidney transplant, Stage 4  Renal evaluation yesterday done by Dr. Mercy Moore.who feels there is a greater than 50% chance the Scr will increase but then recover but there is also 20-30% chance that renal fx could worsen to point of HD if cath done. 3. HTN  4. Remote PAF at time of kidney transplant  5. Antiphospholipid syndrome with h/o blood clot, on coumadin  6. Pulm HTN: PA pressure 82 mm HG by recent ECHO  7. Aortic sclerosis/mild AR   Pt does not wish to pursue cath with contrast due to appropiate renal risk. Discussed R heart cath alone and this is a potential furture option. Severe Pulmonary HTN; will ask pulmonary to evaluate if  candidate for additional therapy.   Troy Sine, MD, [facility] 08/26/2013, 9:40 AM

## 2013-08-27 ENCOUNTER — Emergency Department (HOSPITAL_COMMUNITY): Payer: Medicare Other

## 2013-08-27 ENCOUNTER — Observation Stay (HOSPITAL_COMMUNITY): Payer: Medicare Other

## 2013-08-27 ENCOUNTER — Inpatient Hospital Stay (HOSPITAL_COMMUNITY)
Admission: EM | Admit: 2013-08-27 | Discharge: 2013-09-01 | DRG: 286 | Disposition: A | Discharge status: Home / Self Care | Payer: Medicare Other | Attending: Cardiology | Admitting: Cardiology

## 2013-08-27 ENCOUNTER — Encounter (HOSPITAL_COMMUNITY): Payer: Self-pay | Admitting: Emergency Medicine

## 2013-08-27 DIAGNOSIS — I4891 Unspecified atrial fibrillation: Secondary | ICD-10-CM

## 2013-08-27 DIAGNOSIS — I159 Secondary hypertension, unspecified: Secondary | ICD-10-CM

## 2013-08-27 DIAGNOSIS — I272 Pulmonary hypertension, unspecified: Secondary | ICD-10-CM

## 2013-08-27 DIAGNOSIS — R079 Chest pain, unspecified: Secondary | ICD-10-CM

## 2013-08-27 DIAGNOSIS — N179 Acute kidney failure, unspecified: Secondary | ICD-10-CM | POA: Diagnosis present

## 2013-08-27 DIAGNOSIS — I10 Essential (primary) hypertension: Secondary | ICD-10-CM

## 2013-08-27 DIAGNOSIS — T8610 Unspecified complication of kidney transplant: Secondary | ICD-10-CM

## 2013-08-27 DIAGNOSIS — N183 Chronic kidney disease, stage 3 unspecified: Secondary | ICD-10-CM

## 2013-08-27 DIAGNOSIS — J189 Pneumonia, unspecified organism: Secondary | ICD-10-CM

## 2013-08-27 DIAGNOSIS — N184 Chronic kidney disease, stage 4 (severe): Secondary | ICD-10-CM

## 2013-08-27 DIAGNOSIS — I158 Other secondary hypertension: Secondary | ICD-10-CM

## 2013-08-27 LAB — CBC
HCT: 27.1 % — ABNORMAL LOW (ref 36.0–46.0)
HCT: 27.2 % — ABNORMAL LOW (ref 36.0–46.0)
HEMOGLOBIN: 9.1 g/dL — AB (ref 12.0–15.0)
HEMOGLOBIN: 9.1 g/dL — AB (ref 12.0–15.0)
MCH: 27.3 pg (ref 26.0–34.0)
MCH: 27.4 pg (ref 26.0–34.0)
MCHC: 33.5 g/dL (ref 30.0–36.0)
MCHC: 33.6 g/dL (ref 30.0–36.0)
MCV: 81.6 fL (ref 78.0–100.0)
MCV: 81.7 fL (ref 78.0–100.0)
Platelets: 165 10*3/uL (ref 150–400)
Platelets: 167 10*3/uL (ref 150–400)
RBC: 3.32 MIL/uL — ABNORMAL LOW (ref 3.87–5.11)
RBC: 3.33 MIL/uL — AB (ref 3.87–5.11)
RDW: 14.6 % (ref 11.5–15.5)
RDW: 14.7 % (ref 11.5–15.5)
WBC: 5.4 10*3/uL (ref 4.0–10.5)
WBC: 5.9 10*3/uL (ref 4.0–10.5)

## 2013-08-27 LAB — BASIC METABOLIC PANEL
Anion gap: 14 (ref 5–15)
Anion gap: 13 (ref 5–15)
BUN: 30 mg/dL — ABNORMAL HIGH (ref 6–23)
BUN: 29 mg/dL — ABNORMAL HIGH (ref 6–23)
CALCIUM: 9.6 mg/dL (ref 8.4–10.5)
CALCIUM: 9.7 mg/dL (ref 8.4–10.5)
CO2: 20 mEq/L (ref 19–32)
CO2: 18 mEq/L — ABNORMAL LOW (ref 19–32)
Chloride: 101 mEq/L (ref 96–112)
Chloride: 101 mEq/L (ref 96–112)
Creatinine, Ser: 2.62 mg/dL — ABNORMAL HIGH (ref 0.50–1.10)
Creatinine, Ser: 2.61 mg/dL — ABNORMAL HIGH (ref 0.50–1.10)
GFR calc non Af Amer: 18 mL/min — ABNORMAL LOW (ref >90)
GFR, EST AFRICAN AMERICAN: 21 mL/min — AB (ref >90)
GFR, EST AFRICAN AMERICAN: 21 mL/min — AB (ref >90)
GFR, EST NON AFRICAN AMERICAN: 18 mL/min — AB (ref >90)
GLUCOSE: 127 mg/dL — AB (ref 70–99)
Glucose, Bld: 106 mg/dL — ABNORMAL HIGH (ref 70–99)
Potassium: 5 mEq/L (ref 3.7–5.3)
Potassium: 5.6 mEq/L — ABNORMAL HIGH (ref 3.7–5.3)
SODIUM: 132 meq/L — AB (ref 137–147)
SODIUM: 135 meq/L — AB (ref 137–147)

## 2013-08-27 LAB — PROTIME-INR
INR: 2.16 — ABNORMAL HIGH (ref 0.00–1.49)
Prothrombin Time: 24.1 seconds — ABNORMAL HIGH (ref 11.6–15.2)

## 2013-08-27 LAB — PRO B NATRIURETIC PEPTIDE: PRO B NATRI PEPTIDE: 20481 pg/mL — AB (ref 0–125)

## 2013-08-27 LAB — TROPONIN I: Troponin I: <0.3 ng/mL (ref <0.30)

## 2013-08-27 MED ORDER — CALCITRIOL 0.5 MCG PO CAPS
0.5000 ug | ORAL_CAPSULE | ORAL | Status: DC
  Administered 2013-08-28 – 2013-08-30 (×2): 0.5 ug via ORAL
  Filled 2013-08-27 (×3): qty 1

## 2013-08-27 MED ORDER — MORPHINE SULFATE 4 MG/ML IJ SOLN
4.0000 mg | Freq: Once | INTRAMUSCULAR | Status: AC
  Administered 2013-08-27: 4 mg via INTRAVENOUS
  Filled 2013-08-27: qty 1

## 2013-08-27 MED ORDER — WARFARIN 1.25 MG HALF TABLET
1.2500 mg | ORAL_TABLET | ORAL | Status: DC
  Filled 2013-08-27: qty 1

## 2013-08-27 MED ORDER — TIMOLOL MALEATE 0.5 % OP SOLN
1.0000 [drp] | Freq: Two times a day (BID) | OPHTHALMIC | Status: DC
  Administered 2013-08-27 – 2013-09-01 (×10): 1 [drp] via OPHTHALMIC
  Filled 2013-08-27: qty 5

## 2013-08-27 MED ORDER — ISOSORBIDE DINITRATE 10 MG PO TABS
10.0000 mg | ORAL_TABLET | Freq: Three times a day (TID) | ORAL | Status: DC
  Administered 2013-08-27 – 2013-09-01 (×15): 10 mg via ORAL
  Filled 2013-08-27 (×18): qty 1

## 2013-08-27 MED ORDER — TACROLIMUS 1 MG PO CAPS
4.0000 mg | ORAL_CAPSULE | Freq: Every morning | ORAL | Status: DC
  Administered 2013-08-27 – 2013-09-01 (×5): 4 mg via ORAL
  Filled 2013-08-27 (×6): qty 4

## 2013-08-27 MED ORDER — TIMOLOL HEMIHYDRATE 0.5 % OP SOLN: 1.0000 [drp] | Freq: Two times a day (BID) | OPHTHALMIC | Status: DC

## 2013-08-27 MED ORDER — ONDANSETRON HCL 4 MG/2ML IJ SOLN: 4.0000 mg | Freq: Four times a day (QID) | INTRAMUSCULAR | Status: DC | PRN

## 2013-08-27 MED ORDER — MAGNESIUM OXIDE 400 (241.3 MG) MG PO TABS
400.0000 mg | ORAL_TABLET | Freq: Two times a day (BID) | ORAL | Status: DC
  Administered 2013-08-27 – 2013-09-01 (×10): 400 mg via ORAL
  Filled 2013-08-27 (×12): qty 1

## 2013-08-27 MED ORDER — SODIUM BICARBONATE 650 MG PO TABS
650.0000 mg | ORAL_TABLET | Freq: Two times a day (BID) | ORAL | Status: DC
  Administered 2013-08-27 – 2013-09-01 (×10): 650 mg via ORAL
  Filled 2013-08-27 (×12): qty 1

## 2013-08-27 MED ORDER — FUROSEMIDE 20 MG PO TABS: 20.0000 mg | ORAL_TABLET | Freq: Every day | ORAL | Status: DC | PRN

## 2013-08-27 MED ORDER — NITROGLYCERIN 0.4 MG SL SUBL: 0.4000 mg | SUBLINGUAL_TABLET | SUBLINGUAL | Status: DC | PRN

## 2013-08-27 MED ORDER — FAMOTIDINE 20 MG PO TABS
20.0000 mg | ORAL_TABLET | Freq: Two times a day (BID) | ORAL | Status: DC
  Administered 2013-08-27 (×2): 20 mg via ORAL
  Filled 2013-08-27 (×4): qty 1

## 2013-08-27 MED ORDER — CARVEDILOL 3.125 MG PO TABS
3.1250 mg | ORAL_TABLET | Freq: Two times a day (BID) | ORAL | Status: DC
  Administered 2013-08-27 – 2013-09-01 (×9): 3.125 mg via ORAL
  Filled 2013-08-27 (×14): qty 1

## 2013-08-27 MED ORDER — ATORVASTATIN CALCIUM 10 MG PO TABS
10.0000 mg | ORAL_TABLET | Freq: Every day | ORAL | Status: DC
  Administered 2013-08-27 – 2013-09-01 (×6): 10 mg via ORAL
  Filled 2013-08-27 (×6): qty 1

## 2013-08-27 MED ORDER — CALCITRIOL 0.25 MCG PO CAPS
0.2500 ug | ORAL_CAPSULE | ORAL | Status: DC
  Administered 2013-08-27 – 2013-08-31 (×3): 0.25 ug via ORAL
  Filled 2013-08-27 (×3): qty 1

## 2013-08-27 MED ORDER — ASPIRIN EC 81 MG PO TBEC
81.0000 mg | DELAYED_RELEASE_TABLET | Freq: Every day | ORAL | Status: DC
  Administered 2013-08-27 – 2013-09-01 (×6): 81 mg via ORAL
  Filled 2013-08-27 (×6): qty 1

## 2013-08-27 MED ORDER — RANOLAZINE ER 500 MG PO TB12
500.0000 mg | ORAL_TABLET | Freq: Two times a day (BID) | ORAL | Status: DC
  Administered 2013-08-27 – 2013-09-01 (×10): 500 mg via ORAL
  Filled 2013-08-27 (×12): qty 1

## 2013-08-27 MED ORDER — TACROLIMUS 1 MG PO CAPS
3.0000 mg | ORAL_CAPSULE | Freq: Every day | ORAL | Status: DC
  Administered 2013-08-27 – 2013-08-31 (×5): 3 mg via ORAL
  Filled 2013-08-27 (×6): qty 3

## 2013-08-27 MED ORDER — WARFARIN SODIUM 2.5 MG PO TABS: 2.5000 mg | ORAL_TABLET | ORAL | Status: DC

## 2013-08-27 MED ORDER — ASPIRIN 81 MG PO CHEW
324.0000 mg | CHEWABLE_TABLET | Freq: Once | ORAL | Status: AC
  Administered 2013-08-27: 324 mg via ORAL
  Filled 2013-08-27: qty 4

## 2013-08-27 MED ORDER — ACETAMINOPHEN 325 MG PO TABS: 650.0000 mg | ORAL_TABLET | ORAL | Status: DC | PRN

## 2013-08-27 MED ORDER — WARFARIN - PHYSICIAN DOSING INPATIENT: Freq: Every day | Status: DC

## 2013-08-27 MED ORDER — MYCOPHENOLATE SODIUM 180 MG PO TBEC
180.0000 mg | DELAYED_RELEASE_TABLET | Freq: Two times a day (BID) | ORAL | Status: DC
  Administered 2013-08-27 – 2013-09-01 (×10): 180 mg via ORAL
  Filled 2013-08-27 (×12): qty 1

## 2013-08-27 NOTE — ED Notes (Signed)
Pt transported to Xray. 

## 2013-08-27 NOTE — ED Notes (Signed)
Pt is NPO until Cardiology assess per Dr.Linker

## 2013-08-27 NOTE — H&P (Addendum)
Cardiology H&P Note  Patient ID: Debbie [name], MRN: [id number], DOB/AGE: [id number] y.o. Admit date: 08/27/2013   Date of Consult: 08/27/2013 Primary Physician: Placido Sou, MD Primary Cardiologist: [place]   Chief Complaint: chest pain     Assessment and Plan:  1. Chest pain  - atypical for angina. Other DDx could be lung process. ( Chest xray appear worse , pt was recently treated for pneumonia) . Cont medical management for CAD. Will need to have discussion with nephrology and cadiology again about utility of LHC/ RHC since the pt is failing conservative therapy and is here with readmission. Can also consider CT chest with no contrast to evaluate any underlying lung masses/lesions.   2.CKD s/p kidney transplant, StageIV 3. HTN - uncontrolled. Will adjust BP after am meds are given  4. Remote PAF at time of kidney transplant . Currently in sinus  5. Antiphospholipid syndrome with h/o blood clot, on coumadin , INR therapeutic  6. Pulm HTN: PA pressure 82 mm HG by recent ECHO ; refused RHC recnetly  7. Aortic sclerosis/mild AR    63 y.o. female with a possible history of CAD . She had a recent stress test that showed mild ischemia but was felt to be low risk. Because of her poor renal function, the decision was made to treat medically. She had recurrent chest pain and came to the hospital 08/24/13 to 08/26/13  She was started on nitrates. Her chest pain resolved. Initially, a dobutamine stress echo was planned. However, on review of her 2-D echocardiogram, significant pulmonary hypertension was noted. Therefore the decision was made to cancel a dobutamine stress echo. Plan was then to consider LHC and RHC . However pt did not want to take the risk of worsening kidney function with the procedure and this was not done. Pt was also being treated for possibel pneumonia. She now returns same day with recurrence of her chest pai States that when she was preparing to go to bed she had  the same chest pain described as burning left upper chest and left upper back and jaw. This is self limited with only mild improvement with SL NTG. Denies any exertional componenet.  RoS  No orthopnea, PND , LE edema , focal weakness, syncope, bleeding diathesis , claudication , palpitation etc .  Reports medication compliance  Past Medical History  Diagnosis Date  . Kidney transplant as cause of abnormal reaction or later complication   . Hypertension   . Colon polyps   . Gall stones 1980  . Anti-phospholipid antibody syndrome     (Based on hospital discharge summary march 2013)  . Atrial fibrillation   . Carpal tunnel syndrome 2003    lt  . Esophageal stricture   . Hemorrhoids   . Renal disorder   . ESRD (end stage renal disease) on dialysis     "til I got my transplant in 2010"  . DVT (deep venous thrombosis) 1990    LLE  . History of blood transfusion     "related to the lupus"  . Dyslipidemia 07/22/2013  . Coronary artery disease   . Heart murmur   . Pneumonia     "3 times now" (08/25/2013)  . GERD (gastroesophageal reflux disease)   . Arthritis     "fingers" (08/25/2013)  . Glaucoma, left eye       Most Recent Cardiac Studies: lexiscan myoview 07/02/2013 EF 54%, evidence of subendocardial scar and possible attenuation, mild distal anterior/apical ischemia  Surgical History:  Past Surgical History  Procedure Laterality Date  . Nephrectomy transplanted organ  05/2008  . Cholecystectomy  1980  . Hematoma evacuation  1998    "after they took peritoneal catheter out"  . Peritoneal catheter insertion    . Peritoneal catheter removal    . Carpal tunnel release Right 07/2001  . Arteriovenous graft placement    . Thrombectomy and revision of arterioventous (av) goretex  graft Right 07/1999; 04/2002; 09/2002; 12/2005; 09/2007;     upper arm/notes 07/31/1999; 05/18/2002; 10/07/2002; 01/14/2006; 09/14/2007;      Home Meds: Prior to Admission medications   Medication Sig Start Date  End Date Taking? Authorizing Provider  aspirin EC 81 MG tablet Take 81 mg by mouth daily.   Yes Historical Provider, MD  atorvastatin (LIPITOR) 10 MG tablet Take 1 tablet (10 mg total) by mouth daily. 07/22/13  Yes Sueanne Margarita, MD  calcitRIOL (ROCALTROL) 0.25 MCG capsule Take 0.25-0.5 mcg by mouth daily. Take 0.25 mcg daily alternating with 0.5 mcg 06/12/13  Yes Historical Provider, MD  carvedilol (COREG) 3.125 MG tablet Take 1 tablet (3.125 mg total) by mouth 2 (two) times daily with a meal. 08/26/13  Yes Rhonda G Barrett, PA-C  furosemide (LASIX) 20 MG tablet Take 20 mg by mouth daily as needed. Excess edema   Yes Historical Provider, MD  isosorbide dinitrate (ISORDIL) 10 MG tablet Take 1 tablet (10 mg total) by mouth 3 (three) times daily. 08/26/13  Yes Rhonda G Barrett, PA-C  LUMIGAN 0.01 % SOLN Place 1 drop into both eyes at bedtime.  10/30/11  Yes Historical Provider, MD  magnesium oxide (MAG-OX) 400 (241.3 MG) MG tablet Take 1 tablet (400 mg total) by mouth 2 (two) times daily. 08/26/13  Yes Rhonda G Barrett, PA-C  mycophenolate (MYFORTIC) 180 MG EC tablet Take 180 mg by mouth 2 (two) times daily.    Yes Historical Provider, MD  nitroGLYCERIN (NITROSTAT) 0.4 MG SL tablet Place 1 tablet (0.4 mg total) under the tongue every 5 (five) minutes x 3 doses as needed for chest pain. 08/26/13  Yes Rhonda G Barrett, PA-C  ranitidine (ZANTAC) 150 MG tablet Take 150 mg by mouth daily.    Yes Historical Provider, MD  ranolazine (RANEXA) 500 MG 12 hr tablet Take 1 tablet (500 mg total) by mouth 2 (two) times daily. 08/26/13  Yes Rhonda G Barrett, PA-C  sodium bicarbonate 650 MG tablet Take 650 mg by mouth 2 (two) times daily.   Yes Historical Provider, MD  tacrolimus (PROGRAF) 1 MG capsule Take 3-4 mg by mouth See admin instructions. 4 caps in am and 3 caps in pm.   Yes Historical Provider, MD  timolol (BETIMOL) 0.5 % ophthalmic solution Place 1 drop into both eyes 2 (two) times daily.   Yes Historical Provider, MD   warfarin (COUMADIN) 2.5 MG tablet Take 1.25-2.5 mg by mouth every evening. Alternating between 1.25mg  and 2.5mg  daily.   Yes Historical Provider, MD  ALPHAGAN P 0.1 % SOLN Place 1 drop into both eyes at bedtime.  10/11/11   Historical Provider, MD    Inpatient Medications:       Allergies:  Allergies  Allergen Reactions  . Gentamicin Itching    unknown  . Gentamycin [Gentamicin Sulfate] Itching and Swelling  . Penicillins Itching  . Vancomycin Itching and Swelling    History   Social History  . Marital Status: Married    Spouse Name: N/A    Number of Children: 3  .  Years of Education: N/A   Occupational History  . disabled    Social History Main Topics  . Smoking status: Former Smoker -- 1.00 packs/day for 30 years    Types: Cigarettes    Quit date: 04/16/1977  . Smokeless tobacco: Never Used  . Alcohol Use: Yes     Comment: "quit drinking in 1979"  . Drug Use: No  . Sexual Activity: Not Currently   Other Topics Concern  . Not on file   Social History Narrative  . No narrative on file     Family History  Problem Relation Age of Onset  . Hypertension Mother   . Heart disease Mother   . Colon cancer Neg Hx   . Rectal cancer Neg Hx   . Stomach cancer Neg Hx      Review of Systems: General: negative for chills, fever, night sweats or weight changes.  Cardiovascular:per HPI  Dermatological: negative for rash Respiratory: negative for cough or wheezing Urologic: negative for hematuria Abdominal: negative for nausea, vomiting, diarrhea, bright red blood per rectum, melena, or hematemesis Neurologic: negative for visual changes, syncope, or dizziness All other systems reviewed and are otherwise negative except as noted above.  Labs:  Recent Labs  08/24/13 1540 08/24/13 2050 08/25/13 0150 08/27/13 0050  TROPONINI <0.30 <0.30 <0.30 <0.30   Lab Results  Component Value Date   WBC 5.4 08/27/2013   HGB 9.1* 08/27/2013   HCT 27.1* 08/27/2013   MCV 81.6  08/27/2013   PLT 165 08/27/2013    Recent Labs Lab 08/25/13 0150 08/27/13 0050  NA 133* 135*  K 5.2 5.0  CL 101 101  CO2 20 20  BUN 28* 30*  CREATININE 2.35* 2.62*  CALCIUM 9.2 9.6  PROT 6.0  --   BILITOT 0.3  --   ALKPHOS 79  --   ALT 11  --   AST 19  --   GLUCOSE 100* 127*   Lab Results  Component Value Date   CHOL 146 08/25/2013   HDL 46 08/25/2013   LDLCALC 81 08/25/2013   TRIG 97 08/25/2013   Lab Results  Component Value Date   DDIMER 0.40 08/24/2013   Trop I <0.03 INR 2.16   Radiology/Studies:  Dg Chest 2 View  08/27/2013   CLINICAL DATA:  Recurrent chest pain, getting worse.  EXAM: CHEST  2 VIEW  COMPARISON:  Chest radiograph August 24, 2013  FINDINGS: Worsening left mid and lower lung zone alveolar airspace opacities. Small left pleural effusion. Cardiac silhouette appears mildly enlarged, mediastinal silhouette is nonsuspicious. Central pulmonary vascular congestion persists. No pneumothorax.  Vascular clips and stents in the bilateral arms. Mild degenerative change of the thoracic spine. Surgical clips in the included right abdomen likely reflect cholecystectomy.  IMPRESSION: Cardiomegaly, central pulmonary vascular congestion.  Worsening right mid and lower lung zone airspace opacities may reflect confluent edema or possibly pneumonia, with small left pleural effusion. Recommend followup chest radiograph after treatment to verify improvement.   Electronically Signed   By: Elon Alas   On: 08/27/2013 03:23      EKG: NSR, incomplete BBB   Physical Exam: Blood pressure 171/74, pulse 79, temperature 97.7 F (36.5 C), temperature source Oral, resp. rate 17, height 5\' 1"  (1.549 m), weight 76.658 kg (169 lb), SpO2 100.00%. General: Well developed, well nourished, in no acute distress.  Neck: Negative for carotid bruits. JVD not elevated. Lungs: Clear bilaterally to auscultation without wheezes, rales, or rhonchi. Breathing is unlabored. Heart: RRR with  S1 S2. 3/6  pansystolic murmur throughout the precordium  Abdomen: Soft, non-tender, non-distended with normoactive bowel sounds. No hepatomegaly. No rebound/guarding. No obvious abdominal masses. Extremities: No clubbing or cyanosis. 1+ ankle bilat edema.  Neuro: Alert and oriented X 3. No facial asymmetry. No focal deficit. Moves all extremities spontaneously. Psych:  Responds to questions appropriately with a normal affect.       Cory Roughen, A M.D  08/27/2013, 3:51 AM

## 2013-08-27 NOTE — ED Notes (Signed)
The pt hasx been having lt upper chest pain for one hour.  She took 3 nitro that helped but when the nitro wore off the pain started back again.  C/o sob also.  She was just discharged from  This hospital today for chest pain

## 2013-08-27 NOTE — ED Provider Notes (Signed)
CSN: [id number]     Arrival date & time 08/27/13  0019 History   First MD Initiated Contact with Patient 08/27/13 0221     Chief Complaint  Patient presents with  . Chest Pain     (Consider location/radiation/quality/duration/timing/severity/associated sxs/prior Treatment) HPI Pt presenting with c/o left sided upper chest pain.  She states pain has been ongoing for approx 1 hour prior to arrival.  She took nitroclycerin at home which worked temporarily but then chest pain recurred.  Pt was discharged earlier today from cardiology service after evaluation for chest pain.  She states that at the time of discharge she was having no pain.  Pain associated with shortness of breath.  Per prior records, she has hx of renal transplant and renal insuficciency- she declined to have both right and left heart caths due to concern about further renal damage.  Of note, she has been on cefdinir since 7/30 for pneumonia- CXR on 8/3 showed multifocal pneumonia.  Pt denies cough, no fever/chills.  She did have recent VQ scan which was negative for PE.  There are no other associated systemic symptoms, there are no other alleviating or modifying factors.   Past Medical History  Diagnosis Date  . Kidney transplant as cause of abnormal reaction or later complication   . Hypertension   . Colon polyps   . Gall stones 1980  . Anti-phospholipid antibody syndrome     (Based on hospital discharge summary march 2013)  . Atrial fibrillation   . Carpal tunnel syndrome 2003    lt  . Esophageal stricture   . Hemorrhoids   . Renal disorder   . ESRD (end stage renal disease) on dialysis     "til I got my transplant in 2010"  . DVT (deep venous thrombosis) 1990    LLE  . History of blood transfusion     "related to the lupus"  . Dyslipidemia 07/22/2013  . Coronary artery disease   . Heart murmur   . Pneumonia     "3 times now" (08/25/2013)  . GERD (gastroesophageal reflux disease)   . Arthritis     "fingers"  (08/25/2013)  . Glaucoma, left eye    Past Surgical History  Procedure Laterality Date  . Nephrectomy transplanted organ  05/2008  . Cholecystectomy  1980  . Hematoma evacuation  1998    "after they took peritoneal catheter out"  . Peritoneal catheter insertion    . Peritoneal catheter removal    . Carpal tunnel release Right 07/2001  . Arteriovenous graft placement    . Thrombectomy and revision of arterioventous (av) goretex  graft Right 07/1999; 04/2002; 09/2002; 12/2005; 09/2007;     upper arm/notes 07/31/1999; 05/18/2002; 10/07/2002; 01/14/2006; 09/14/2007;    Family History  Problem Relation Age of Onset  . Hypertension Mother   . Heart disease Mother   . Colon cancer Neg Hx   . Rectal cancer Neg Hx   . Stomach cancer Neg Hx    History  Substance Use Topics  . Smoking status: Former Smoker -- 1.00 packs/day for 30 years    Types: Cigarettes    Quit date: 04/16/1977  . Smokeless tobacco: Never Used  . Alcohol Use: Yes     Comment: "quit drinking in 1979"   OB History   Grav Para Term Preterm Abortions TAB SAB Ect Mult Living                 Review of Systems ROS reviewed and all otherwise  negative except for mentioned in HPI    Allergies  Gentamicin; Gentamycin; Penicillins; and Vancomycin  Home Medications   Prior to Admission medications   Medication Sig Start Date End Date Taking? Authorizing Provider  aspirin EC 81 MG tablet Take 81 mg by mouth daily.   Yes Historical Provider, MD  atorvastatin (LIPITOR) 10 MG tablet Take 1 tablet (10 mg total) by mouth daily. 07/22/13  Yes Sueanne Margarita, MD  calcitRIOL (ROCALTROL) 0.25 MCG capsule Take 0.25-0.5 mcg by mouth daily. Take 0.25 mcg daily alternating with 0.5 mcg 06/12/13  Yes Historical Provider, MD  carvedilol (COREG) 3.125 MG tablet Take 1 tablet (3.125 mg total) by mouth 2 (two) times daily with a meal. 08/26/13  Yes Rhonda G Barrett, PA-C  furosemide (LASIX) 20 MG tablet Take 20 mg by mouth daily as needed. Excess edema    Yes Historical Provider, MD  isosorbide dinitrate (ISORDIL) 10 MG tablet Take 1 tablet (10 mg total) by mouth 3 (three) times daily. 08/26/13  Yes Rhonda G Barrett, PA-C  LUMIGAN 0.01 % SOLN Place 1 drop into both eyes at bedtime.  10/30/11  Yes Historical Provider, MD  magnesium oxide (MAG-OX) 400 (241.3 MG) MG tablet Take 1 tablet (400 mg total) by mouth 2 (two) times daily. 08/26/13  Yes Rhonda G Barrett, PA-C  mycophenolate (MYFORTIC) 180 MG EC tablet Take 180 mg by mouth 2 (two) times daily.    Yes Historical Provider, MD  nitroGLYCERIN (NITROSTAT) 0.4 MG SL tablet Place 1 tablet (0.4 mg total) under the tongue every 5 (five) minutes x 3 doses as needed for chest pain. 08/26/13  Yes Rhonda G Barrett, PA-C  ranitidine (ZANTAC) 150 MG tablet Take 150 mg by mouth daily.    Yes Historical Provider, MD  ranolazine (RANEXA) 500 MG 12 hr tablet Take 1 tablet (500 mg total) by mouth 2 (two) times daily. 08/26/13  Yes Rhonda G Barrett, PA-C  sodium bicarbonate 650 MG tablet Take 650 mg by mouth 2 (two) times daily.   Yes Historical Provider, MD  tacrolimus (PROGRAF) 1 MG capsule Take 3-4 mg by mouth See admin instructions. 4 caps in am and 3 caps in pm.   Yes Historical Provider, MD  timolol (BETIMOL) 0.5 % ophthalmic solution Place 1 drop into both eyes 2 (two) times daily.   Yes Historical Provider, MD  warfarin (COUMADIN) 2.5 MG tablet Take 1.25-2.5 mg by mouth every evening. Alternating between 1.25mg  and 2.5mg  daily.   Yes Historical Provider, MD  ALPHAGAN P 0.1 % SOLN Place 1 drop into both eyes at bedtime.  10/11/11   Historical Provider, MD   BP 155/66  Pulse 72  Temp(Src) 98.3 F (36.8 C) (Oral)  Resp 16  Ht 5\' 1"  (1.549 m)  Wt 169 lb (76.658 kg)  BMI 31.95 kg/m2  SpO2 99% Vitals reviewed Physical Exam Physical Examination: General appearance - alert, well appearing, and in no distress Mental status - alert, oriented to person, place, and time Eyes - no conjunctival injection, no scleral  icterus Mouth - mucous membranes moist, pharynx normal without lesions Chest - clear to auscultation, no wheezes, rales or rhonchi, symmetric air entry Heart - normal rate, regular rhythm, normal S1, S2, no murmurs, rubs, clicks or gallops Abdomen - soft, nontender, nondistended, no masses or organomegaly Extremities - peripheral pulses normal, no pedal edema, no clubbing or cyanosis Skin - normal coloration and turgor, no rashes  ED Course  Procedures (including critical care time)  2:54 AM pt  started on cefdinir 7/30 for pneumonia, CXR 8/3 shows multifocal pneumonia.  Troponin negative.  Will repeat CXR tonight to see if pneumonia is improving on abx.  Will consult cardiology as well.   3:43 AM d/w cardiology, he will see patient in the ED.  I have reviewed recent admission to cardiology service, worsening fluid/possible pneumonia on CXR.  He will see patient  4:32 AM Dr. Inda Castle, cardiology has seen patient and has admitted her for observation.   Pt is currently chest pain free.  Labs Review Labs Reviewed  CBC - Abnormal; Notable for the following:    RBC 3.32 (*)    Hemoglobin 9.1 (*)    HCT 27.1 (*)    All other components within normal limits  BASIC METABOLIC PANEL - Abnormal; Notable for the following:    Sodium 135 (*)    Glucose, Bld 127 (*)    BUN 30 (*)    Creatinine, Ser 2.62 (*)    GFR calc non Af Amer 18 (*)    GFR calc Af Amer 21 (*)    All other components within normal limits  PRO B NATRIURETIC PEPTIDE - Abnormal; Notable for the following:    Pro B Natriuretic peptide (BNP) 20481.0 (*)    All other components within normal limits  PROTIME-INR - Abnormal; Notable for the following:    Prothrombin Time 24.1 (*)    INR 2.16 (*)    All other components within normal limits  BASIC METABOLIC PANEL - Abnormal; Notable for the following:    Sodium 132 (*)    Potassium 5.6 (*)    CO2 18 (*)    Glucose, Bld 106 (*)    BUN 29 (*)    Creatinine, Ser 2.61 (*)    GFR  calc non Af Amer 18 (*)    GFR calc Af Amer 21 (*)    All other components within normal limits  CBC - Abnormal; Notable for the following:    RBC 3.33 (*)    Hemoglobin 9.1 (*)    HCT 27.2 (*)    All other components within normal limits  TROPONIN I    Imaging Review Dg Chest 2 View  08/27/2013   CLINICAL DATA:  Recurrent chest pain, getting worse.  EXAM: CHEST  2 VIEW  COMPARISON:  Chest radiograph August 24, 2013  FINDINGS: Worsening left mid and lower lung zone alveolar airspace opacities. Small left pleural effusion. Cardiac silhouette appears mildly enlarged, mediastinal silhouette is nonsuspicious. Central pulmonary vascular congestion persists. No pneumothorax.  Vascular clips and stents in the bilateral arms. Mild degenerative change of the thoracic spine. Surgical clips in the included right abdomen likely reflect cholecystectomy.  IMPRESSION: Cardiomegaly, central pulmonary vascular congestion.  Worsening right mid and lower lung zone airspace opacities may reflect confluent edema or possibly pneumonia, with small left pleural effusion. Recommend followup chest radiograph after treatment to verify improvement.   Electronically Signed   By: Elon Alas   On: 08/27/2013 03:23   Ct Chest Wo Contrast  08/27/2013   CLINICAL DATA:  Atypical chest pain.  EXAM: CT CHEST WITHOUT CONTRAST  TECHNIQUE: Multidetector CT imaging of the chest was performed following the standard protocol without IV contrast.  COMPARISON:  Chest radiographs 08/27/2013 and earlier  FINDINGS: No enlarged axillary or hilar lymph nodes are identified. There are multiple small mediastinal lymph nodes measuring up to 1.1 cm in short axis. Advanced, three-vessel coronary artery calcifications are noted. Mitral annular and mild aortic valve calcification  is noted. The main pulmonary artery is enlarged, measuring 3.8 cm in diameter. There is trace pericardial fluid. Trace bilateral pleural effusions are present, right larger  than left.  Patchy, predominantly peribronchovascular ground-glass opacities are present in the right upper lobe. There is evidence of left upper lobe/lingular volume loss. Confluent opacity anteriorly in the left upper lobe/ lingula extending to the pleural surface may reflect atelectasis and scarring related to prior infection/inflammation. Two adjacent pleural calcifications are noted. Evaluation of the lung bases is partially limited by motion, however there is more extensive ground-glass and patchy consolidative opacity in the left lower lobe. Mild ground-glass opacity is present in the basilar right lower lobe. A small amount of pleural fluid is present in the left major fissure.  Cholecystectomy clips are partially visualized in the upper abdomen. Extensive upper abdominal atherosclerotic vascular calcification is noted. Mild thoracic spondylosis is noted.  IMPRESSION: 1. Left lower lobe greater than right upper lobe opacities, concerning for multifocal infection. Right lower lobe ground-glass opacity may reflect atelectasis or additional infection. Radiographic follow-up recommended to ensure resolution with consideration for short-term follow-up chest CT after treatment. 2. Left upper lobe/lingular volume loss with subpleural opacity suggestive of chronic atelectasis. 3. Trace bilateral pleural effusions. 4. Main pulmonary artery enlargement, which can be seen in the setting of pulmonary arterial hypertension. 5. Advanced atherosclerosis. 6. Small mediastinal lymph nodes, most likely reactive.   Electronically Signed   By: Logan Bores   On: 08/27/2013 11:33     EKG Interpretation   Date/Time:  Thursday August 27 2013 00:20:15 EDT Ventricular Rate:  79 PR Interval:  170 QRS Duration: 108 QT Interval:  426 QTC Calculation: 488 R Axis:   87 Text Interpretation:  Normal sinus rhythm Possible Anterior infarct , age  undetermined Abnormal ECG No significant change since last tracing  Confirmed by  [facility]  MD, [place] [phone]) on 08/27/2013 2:56:05 AM      MDM   Final diagnoses:  Chest pain, unspecified chest pain type  CKD (chronic kidney disease), stage III  Secondary hypertension, unspecified   Pt presenting with ongoing chest pain after discharge earlier today for chest pain evalaution.  Pt on abx for multifocal pneumonia as well- CXR tonight seems to have increased opacification.   Xray images reviewed and interpreted by me as well.  Pt started on IV abx, chest pain resolved after meds in the ED.  ekg and troponin are reassuring, creatinine is at/near her baseline.  Cardiology has seen patient and plans to admit.      Threasa Beards, MD 08/28/13 [phone]

## 2013-08-27 NOTE — Progress Notes (Addendum)
See this morning's H&P for full detail  1. Chest pain - atypical for angina.  - consider obtain CT of chest w/o contrast today to r/o underlying lung disease  - chest pain shoot from front to the back, however symmetrical pulses in bilateral LE, upper extremity pulse weak due to AVF. SBP 170s, however symptom unlikely to be aortic dissection. Will check with Dr Claiborne Billings  - had a extensive discussion with patient regarding possibility of left and R heart cath tomorrow as she was readmitted shortly after discharge, patient states she will talk to her family today and make a decision  - if CT does not reveal any underlying etiology, plan for [facility] tomorrow  - if she agrees to procedure, will get nephrology on board (nephrology consulted during last admission, states she has 50% for worsening renal function, however recover. Her chance to progress to HD is 20-30% after cath with contrast use)  2. CKD s/p kidney transplant, StageIV  3. HTN - uncontrolled. Will adjust BP after am meds are given  4. Remote PAF at time of kidney transplant . Currently in sinus  5. Antiphospholipid syndrome with h/o blood clot, on coumadin , INR therapeutic  6. Pulm HTN: PA pressure 82 mm HG by recent ECHO ; refused RHC recnetly  7. Aortic sclerosis/mild AR   Will discuss with Dr. Claiborne Billings  Signed, Almyra Deforest PA Pager: [phone]  BMET    Component Value Date/Time   NA 132* 08/27/2013 0710   K 5.6* 08/27/2013 0710   CL 101 08/27/2013 0710   CO2 18* 08/27/2013 0710   GLUCOSE 106* 08/27/2013 0710   BUN 29* 08/27/2013 0710   CREATININE 2.61* 08/27/2013 0710   CALCIUM 9.7 08/27/2013 0710   GFRNONAA 18* 08/27/2013 0710   GFRAA 21* 08/27/2013 0710   CBC    Component Value Date/Time   WBC 5.9 08/27/2013 0710   RBC 3.33* 08/27/2013 0710   HGB 9.1* 08/27/2013 0710   HCT 27.2* 08/27/2013 0710   PLT 167 08/27/2013 0710   MCV 81.7 08/27/2013 0710   MCH 27.3 08/27/2013 0710   MCHC 33.5 08/27/2013 0710   RDW 14.7 08/27/2013 0710   LYMPHSABS 1.7 08/24/2013  0950   MONOABS 0.4 08/24/2013 0950   EOSABS 0.1 08/24/2013 0950   BASOSABS 0.0 08/24/2013 0950   . aspirin EC  81 mg Oral Daily  . atorvastatin  10 mg Oral Daily  . calcitRIOL  0.25 mcg Oral QODAY  . [START ON 08/28/2013] calcitRIOL  0.5 mcg Oral QODAY  . carvedilol  3.125 mg Oral BID WC  . famotidine  20 mg Oral BID  . isosorbide dinitrate  10 mg Oral TID  . magnesium oxide  400 mg Oral BID  . mycophenolate  180 mg Oral BID  . ranolazine  500 mg Oral BID  . sodium bicarbonate  650 mg Oral BID  . tacrolimus  3 mg Oral QHS  . tacrolimus  4 mg Oral q morning - 10a  . timolol  1 drop Both Eyes BID     CT (noncontrast) IMPRESSION:  1. Left lower lobe greater than right upper lobe opacities,  concerning for multifocal infection. Right lower lobe ground-glass  opacity may reflect atelectasis or additional infection.  Radiographic follow-up recommended to ensure resolution with  consideration for short-term follow-up chest CT after treatment.  2. Left upper lobe/lingular volume loss with subpleural opacity  suggestive of chronic atelectasis.  3. Trace bilateral pleural effusions.  4. Main pulmonary artery enlargement, which  can be seen in the  setting of pulmonary arterial hypertension.  5. Advanced atherosclerosis.  6. Small mediastinal lymph nodes, most likely reactive.   BP 146/66 o2 sat 98% P 65 No chest pain now No JVD Decreased BS RRR 2/6 sem; whiff AR BS soft No edema  I am very concerned about contrast with Cr 2.61 in transplanted kidney. Will set up for R heart cath without coronary angiography to further eval pulm HTN and determine if candidate for treatment. Will add amlodipine 5 mg to medical regimen for empiric anti-anginal therapy and BP control.Marland Kitchen Troy Sine, MD 08/27/2013 2:29 PM

## 2013-08-27 NOTE — ED Notes (Signed)
Attempted IV access x 1.  Second RN to attempt IV access.

## 2013-08-27 NOTE — Progress Notes (Signed)
UR completed 

## 2013-08-27 NOTE — ED Notes (Signed)
The pt is a kidney transplant pt  From 2010

## 2013-08-27 NOTE — Progress Notes (Signed)
Pt noncompliant with fall risk interventions. I educated patient on importance of fall safety because she has a hx of falls. Will continue to educate and monitor pt.   Prescilla Sours, Therapist, sports

## 2013-08-28 ENCOUNTER — Encounter (HOSPITAL_COMMUNITY)
Admission: EM | Disposition: A | Discharge status: Home / Self Care | Payer: Self-pay | Source: Home / Self Care | Attending: Cardiology

## 2013-08-28 HISTORY — PX: RIGHT HEART CATHETERIZATION: SHX5447

## 2013-08-28 LAB — CBC WITH DIFFERENTIAL/PLATELET
BASOS ABS: 0 10*3/uL (ref 0.0–0.1)
BASOS PCT: 0 % (ref 0–1)
Eosinophils Absolute: 0 10*3/uL (ref 0.0–0.7)
Eosinophils Relative: 0 % (ref 0–5)
HEMATOCRIT: 28.1 % — AB (ref 36.0–46.0)
Hemoglobin: 9.5 g/dL — ABNORMAL LOW (ref 12.0–15.0)
Lymphocytes Relative: 37 % (ref 12–46)
Lymphs Abs: 2.5 10*3/uL (ref 0.7–4.0)
MCH: 27.5 pg (ref 26.0–34.0)
MCHC: 33.8 g/dL (ref 30.0–36.0)
MCV: 81.2 fL (ref 78.0–100.0)
MONO ABS: 0.6 10*3/uL (ref 0.1–1.0)
Monocytes Relative: 9 % (ref 3–12)
NEUTROS ABS: 3.6 10*3/uL (ref 1.7–7.7)
NEUTROS PCT: 54 % (ref 43–77)
Platelets: 173 10*3/uL (ref 150–400)
RBC: 3.46 MIL/uL — ABNORMAL LOW (ref 3.87–5.11)
RDW: 14.6 % (ref 11.5–15.5)
WBC: 6.8 10*3/uL (ref 4.0–10.5)

## 2013-08-28 LAB — POCT I-STAT 3, VENOUS BLOOD GAS (G3P V)
Acid-base deficit: 6 mmol/L — ABNORMAL HIGH (ref 0.0–2.0)
BICARBONATE: 19.8 meq/L — AB (ref 20.0–24.0)
O2 Saturation: 70 %
PH VEN: 7.314 — AB (ref 7.250–7.300)
TCO2: 21 mmol/L (ref 0–100)
pCO2, Ven: 39 mmHg — ABNORMAL LOW (ref 45.0–50.0)
pO2, Ven: 40 mmHg (ref 30.0–45.0)

## 2013-08-28 LAB — BASIC METABOLIC PANEL
Anion gap: 14 (ref 5–15)
BUN: 30 mg/dL — ABNORMAL HIGH (ref 6–23)
CO2: 20 mEq/L (ref 19–32)
Calcium: 9.9 mg/dL (ref 8.4–10.5)
Chloride: 100 mEq/L (ref 96–112)
Creatinine, Ser: 2.63 mg/dL — ABNORMAL HIGH (ref 0.50–1.10)
GFR calc non Af Amer: 18 mL/min — ABNORMAL LOW (ref >90)
GFR, EST AFRICAN AMERICAN: 21 mL/min — AB (ref >90)
Glucose, Bld: 101 mg/dL — ABNORMAL HIGH (ref 70–99)
POTASSIUM: 6.1 meq/L — AB (ref 3.7–5.3)
SODIUM: 134 meq/L — AB (ref 137–147)

## 2013-08-28 LAB — PROTIME-INR
INR: 1.73 — AB (ref 0.00–1.49)
PROTHROMBIN TIME: 20.3 s — AB (ref 11.6–15.2)

## 2013-08-28 SURGERY — RIGHT HEART CATH
Anesthesia: LOCAL

## 2013-08-28 MED ORDER — SODIUM CHLORIDE 0.9 % IV SOLN: 250.0000 mL | INTRAVENOUS | Status: DC | PRN

## 2013-08-28 MED ORDER — MORPHINE SULFATE 2 MG/ML IJ SOLN
2.0000 mg | Freq: Once | INTRAMUSCULAR | Status: AC
  Administered 2013-08-28: 2 mg via INTRAVENOUS
  Filled 2013-08-28: qty 1

## 2013-08-28 MED ORDER — WARFARIN SODIUM 3 MG PO TABS
3.5000 mg | ORAL_TABLET | Freq: Once | ORAL | Status: AC
  Administered 2013-08-28: 3.5 mg via ORAL
  Filled 2013-08-28: qty 1

## 2013-08-28 MED ORDER — ASPIRIN 81 MG PO CHEW: 81.0000 mg | CHEWABLE_TABLET | ORAL | Status: DC

## 2013-08-28 MED ORDER — HEPARIN (PORCINE) IN NACL 2-0.9 UNIT/ML-% IJ SOLN
INTRAMUSCULAR | Status: AC
  Filled 2013-08-28: qty 1000

## 2013-08-28 MED ORDER — MIDAZOLAM HCL 2 MG/2ML IJ SOLN
INTRAMUSCULAR | Status: AC
Start: 2013-08-28 — End: 2013-08-28
  Filled 2013-08-28: qty 2

## 2013-08-28 MED ORDER — SODIUM CHLORIDE 0.9 % IJ SOLN: 3.0000 mL | Freq: Two times a day (BID) | INTRAMUSCULAR | Status: DC

## 2013-08-28 MED ORDER — LIDOCAINE HCL (PF) 1 % IJ SOLN
INTRAMUSCULAR | Status: AC
  Filled 2013-08-28: qty 30

## 2013-08-28 MED ORDER — FENTANYL CITRATE 0.05 MG/ML IJ SOLN
INTRAMUSCULAR | Status: AC
  Filled 2013-08-28: qty 2

## 2013-08-28 MED ORDER — SODIUM CHLORIDE 0.9 % IJ SOLN
3.0000 mL | Freq: Two times a day (BID) | INTRAMUSCULAR | Status: DC
  Administered 2013-08-29 – 2013-08-31 (×6): 3 mL via INTRAVENOUS

## 2013-08-28 MED ORDER — SODIUM CHLORIDE 0.9 % IV SOLN
Freq: Once | INTRAVENOUS | Status: AC
  Administered 2013-08-28: 10:00:00 via INTRAVENOUS

## 2013-08-28 MED ORDER — SODIUM CHLORIDE 0.9 % IJ SOLN: 3.0000 mL | INTRAMUSCULAR | Status: DC | PRN

## 2013-08-28 MED ORDER — MORPHINE SULFATE 2 MG/ML IJ SOLN
INTRAMUSCULAR | Status: AC
  Filled 2013-08-28: qty 1

## 2013-08-28 MED ORDER — WARFARIN - PHARMACIST DOSING INPATIENT
Freq: Every day | Status: DC
  Administered 2013-08-30: 18:00:00

## 2013-08-28 MED ORDER — ONDANSETRON HCL 4 MG/2ML IJ SOLN: 4.0000 mg | Freq: Four times a day (QID) | INTRAMUSCULAR | Status: DC | PRN

## 2013-08-28 MED ORDER — SODIUM POLYSTYRENE SULFONATE 15 GM/60ML PO SUSP
30.0000 g | Freq: Once | ORAL | Status: AC
  Administered 2013-08-28: 30 g via ORAL
  Filled 2013-08-28: qty 120

## 2013-08-28 MED ORDER — FAMOTIDINE 20 MG PO TABS
20.0000 mg | ORAL_TABLET | Freq: Every day | ORAL | Status: DC
  Administered 2013-08-29 – 2013-09-01 (×4): 20 mg via ORAL
  Filled 2013-08-28 (×4): qty 1

## 2013-08-28 MED ORDER — AMLODIPINE BESYLATE 5 MG PO TABS
5.0000 mg | ORAL_TABLET | Freq: Every day | ORAL | Status: DC
  Administered 2013-08-28 – 2013-08-31 (×4): 5 mg via ORAL
  Filled 2013-08-28 (×5): qty 1

## 2013-08-28 NOTE — Progress Notes (Signed)
ANTICOAGULATION CONSULT NOTE - Initial Consult  Pharmacy Consult for coumadin Indication: Antiphospholipid syndrome with h/o blood clot, afib   Allergies  Allergen Reactions  . Gentamicin Itching    unknown  . Gentamycin [Gentamicin Sulfate] Itching and Swelling  . Penicillins Itching  . Vancomycin Itching and Swelling    Patient Measurements: Height: 5\' 1"  (154.9 cm) Weight: 169 lb (76.658 kg) IBW/kg (Calculated) : 47.8  Vital Signs: Temp: 98.2 F (36.8 C) (08/07 0514) BP: 154/64 mmHg (08/07 0514) Pulse Rate: 91 (08/07 1351)  Labs:  Recent Labs  08/26/13 0920  08/27/13 0050 08/27/13 0710 08/28/13 1310  HGB  --   < > 9.1* 9.1* 9.5*  HCT  --   --  27.1* 27.2* 28.1*  PLT  --   --  165 167 173  LABPROT 23.1*  --  24.1*  --  20.3*  INR 2.05*  --  2.16*  --  1.73*  CREATININE  --   --  2.62* 2.61* 2.63*  TROPONINI  --   --  <0.30  --   --   < > = values in this interval not displayed.  Estimated Creatinine Clearance: 20.3 ml/min (by C-G formula based on Cr of 2.63).   Medical History: Past Medical History  Diagnosis Date  . Kidney transplant as cause of abnormal reaction or later complication   . Hypertension   . Colon polyps   . Gall stones 1980  . Anti-phospholipid antibody syndrome     (Based on hospital discharge summary march 2013)  . Atrial fibrillation   . Carpal tunnel syndrome 2003    lt  . Esophageal stricture   . Hemorrhoids   . Renal disorder   . ESRD (end stage renal disease) on dialysis     "til I got my transplant in 2010"  . DVT (deep venous thrombosis) 1990    LLE  . History of blood transfusion     "related to the lupus"  . Dyslipidemia 07/22/2013  . Coronary artery disease   . Heart murmur   . Pneumonia     "3 times now" (08/25/2013)  . GERD (gastroesophageal reflux disease)   . Arthritis     "fingers" (08/25/2013)  . Glaucoma, left eye     Medications:  Prescriptions prior to admission  Medication Sig Dispense Refill  . aspirin  EC 81 MG tablet Take 81 mg by mouth daily.      Marland Kitchen atorvastatin (LIPITOR) 10 MG tablet Take 1 tablet (10 mg total) by mouth daily.  30 tablet  11  . calcitRIOL (ROCALTROL) 0.25 MCG capsule Take 0.25-0.5 mcg by mouth daily. Take 0.25 mcg daily alternating with 0.5 mcg      . carvedilol (COREG) 3.125 MG tablet Take 1 tablet (3.125 mg total) by mouth 2 (two) times daily with a meal.  60 tablet  11  . furosemide (LASIX) 20 MG tablet Take 20 mg by mouth daily as needed. Excess edema      . isosorbide dinitrate (ISORDIL) 10 MG tablet Take 1 tablet (10 mg total) by mouth 3 (three) times daily.  90 tablet  3  . LUMIGAN 0.01 % SOLN Place 1 drop into both eyes at bedtime.       . magnesium oxide (MAG-OX) 400 (241.3 MG) MG tablet Take 1 tablet (400 mg total) by mouth 2 (two) times daily.  60 tablet  3  . mycophenolate (MYFORTIC) 180 MG EC tablet Take 180 mg by mouth 2 (two) times daily.       Marland Kitchen  nitroGLYCERIN (NITROSTAT) 0.4 MG SL tablet Place 1 tablet (0.4 mg total) under the tongue every 5 (five) minutes x 3 doses as needed for chest pain.  25 tablet  12  . ranitidine (ZANTAC) 150 MG tablet Take 150 mg by mouth daily.       . ranolazine (RANEXA) 500 MG 12 hr tablet Take 1 tablet (500 mg total) by mouth 2 (two) times daily.  60 tablet  3  . sodium bicarbonate 650 MG tablet Take 650 mg by mouth 2 (two) times daily.      . tacrolimus (PROGRAF) 1 MG capsule Take 3-4 mg by mouth See admin instructions. 4 caps in am and 3 caps in pm.      . timolol (BETIMOL) 0.5 % ophthalmic solution Place 1 drop into both eyes 2 (two) times daily.      Marland Kitchen warfarin (COUMADIN) 2.5 MG tablet Take 1.25-2.5 mg by mouth every evening. Alternating between 1.25mg  and 2.5mg  daily.      . ALPHAGAN P 0.1 % SOLN Place 1 drop into both eyes at bedtime.        Scheduled:  . amLODipine  5 mg Oral Daily  . aspirin EC  81 mg Oral Daily  . atorvastatin  10 mg Oral Daily  . calcitRIOL  0.25 mcg Oral QODAY  . calcitRIOL  0.5 mcg Oral QODAY  .  carvedilol  3.125 mg Oral BID WC  . famotidine  20 mg Oral BID  . isosorbide dinitrate  10 mg Oral TID  . magnesium oxide  400 mg Oral BID  . mycophenolate  180 mg Oral BID  . ranolazine  500 mg Oral BID  . sodium bicarbonate  650 mg Oral BID  . sodium chloride  3 mL Intravenous Q12H  . tacrolimus  3 mg Oral QHS  . tacrolimus  4 mg Oral q morning - 10a  . timolol  1 drop Both Eyes BID    Assessment: 63 yo female with antiphospholipid syndrome with h/o blood clot and history of afib now s/p RHC to restart coumadin. INR today=  1.73 (last INR was 2.16 on 08/27/13).  Home coumadin dose: alternates 1.25mg  and 2.5mg  daily (last dose was 2.5mg  on 8/5)  Goal of Therapy:  INR 2-3 Monitor platelets by anticoagulation protocol: Yes   Plan:  -Coumadin 3.5mg  po today (due to missed dose on 8/6) -Daily PT/INR  Hildred Laser, Pharm D 08/28/2013 3:19 PM

## 2013-08-28 NOTE — H&P (View-Only) (Signed)
Subjective:  Continues to have intermittent back pain.  Objective:   Vital Signs in the last 24 hours: Temp:  [97.5 F (36.4 C)-98.3 F (36.8 C)] 98.2 F (36.8 C) (08/07 0514) Pulse Rate:  [65-73] 73 (08/07 0514) Resp:  [15-18] 15 (08/07 0514) BP: (146-155)/(64-66) 154/64 mmHg (08/07 0514) SpO2:  [98 %-99 %] 99 % (08/07 0514)  Intake/Output from previous day:    Medications: . [START ON 08/29/2013] aspirin  81 mg Oral Pre-Cath  . aspirin EC  81 mg Oral Daily  . atorvastatin  10 mg Oral Daily  . calcitRIOL  0.25 mcg Oral QODAY  . calcitRIOL  0.5 mcg Oral QODAY  . carvedilol  3.125 mg Oral BID WC  . famotidine  20 mg Oral BID  . isosorbide dinitrate  10 mg Oral TID  . magnesium oxide  400 mg Oral BID  . mycophenolate  180 mg Oral BID  . ranolazine  500 mg Oral BID  . sodium bicarbonate  650 mg Oral BID  . sodium chloride  3 mL Intravenous Q12H  . tacrolimus  3 mg Oral QHS  . tacrolimus  4 mg Oral q morning - 10a  . timolol  1 drop Both Eyes BID       Physical Exam:   General appearance: alert, cooperative and no distress Neck: no carotid bruit, no JVD, supple, symmetrical, trachea midline and thyroid not enlarged, symmetric, no tenderness/mass/nodules Lungs: decreased BS; no wheezing Heart: regular rate and rhythm and 2/6 sem Abdomen: soft, non-tender; bowel sounds normal; no masses,  no organomegaly Extremities: no edema, redness or tenderness in the calves or thighs Neurologic: Grossly normal   Rate: 82  Rhythm: normal sinus rhythm  Lab Results:   Recent Labs  08/27/13 0050 08/27/13 0710  NA 135* 132*  K 5.0 5.6*  CL 101 101  CO2 20 18*  GLUCOSE 127* 106*  BUN 30* 29*  CREATININE 2.62* 2.61*     Recent Labs  08/27/13 0050  TROPONINI <0.30    Hepatic Function Panel No results found for this basename: PROT, ALBUMIN, AST, ALT, ALKPHOS, BILITOT, BILIDIR, IBILI,  in the last 72 hours  Recent Labs  08/27/13 0050  INR 2.16*   BNP (last 3  results)  Recent Labs  08/27/13 0050  PROBNP 20481.0*    Lipid Panel     Component Value Date/Time   CHOL 146 08/25/2013 0150   TRIG 97 08/25/2013 0150   HDL 46 08/25/2013 0150   CHOLHDL 3.2 08/25/2013 0150   VLDL 19 08/25/2013 0150   LDLCALC 81 08/25/2013 0150      Imaging:  Dg Chest 2 View  08/27/2013   CLINICAL DATA:  Recurrent chest pain, getting worse.  EXAM: CHEST  2 VIEW  COMPARISON:  Chest radiograph August 24, 2013  FINDINGS: Worsening left mid and lower lung zone alveolar airspace opacities. Small left pleural effusion. Cardiac silhouette appears mildly enlarged, mediastinal silhouette is nonsuspicious. Central pulmonary vascular congestion persists. No pneumothorax.  Vascular clips and stents in the bilateral arms. Mild degenerative change of the thoracic spine. Surgical clips in the included right abdomen likely reflect cholecystectomy.  IMPRESSION: Cardiomegaly, central pulmonary vascular congestion.  Worsening right mid and lower lung zone airspace opacities may reflect confluent edema or possibly pneumonia, with small left pleural effusion. Recommend followup chest radiograph after treatment to verify improvement.   Electronically Signed   By: Elon Alas   On: 08/27/2013 03:23   Ct Chest Wo Contrast  08/27/2013  CLINICAL DATA:  Atypical chest pain.  EXAM: CT CHEST WITHOUT CONTRAST  TECHNIQUE: Multidetector CT imaging of the chest was performed following the standard protocol without IV contrast.  COMPARISON:  Chest radiographs 08/27/2013 and earlier  FINDINGS: No enlarged axillary or hilar lymph nodes are identified. There are multiple small mediastinal lymph nodes measuring up to 1.1 cm in short axis. Advanced, three-vessel coronary artery calcifications are noted. Mitral annular and mild aortic valve calcification is noted. The main pulmonary artery is enlarged, measuring 3.8 cm in diameter. There is trace pericardial fluid. Trace bilateral pleural effusions are present, right  larger than left.  Patchy, predominantly peribronchovascular ground-glass opacities are present in the right upper lobe. There is evidence of left upper lobe/lingular volume loss. Confluent opacity anteriorly in the left upper lobe/ lingula extending to the pleural surface may reflect atelectasis and scarring related to prior infection/inflammation. Two adjacent pleural calcifications are noted. Evaluation of the lung bases is partially limited by motion, however there is more extensive ground-glass and patchy consolidative opacity in the left lower lobe. Mild ground-glass opacity is present in the basilar right lower lobe. A small amount of pleural fluid is present in the left major fissure.  Cholecystectomy clips are partially visualized in the upper abdomen. Extensive upper abdominal atherosclerotic vascular calcification is noted. Mild thoracic spondylosis is noted.  IMPRESSION: 1. Left lower lobe greater than right upper lobe opacities, concerning for multifocal infection. Right lower lobe ground-glass opacity may reflect atelectasis or additional infection. Radiographic follow-up recommended to ensure resolution with consideration for short-term follow-up chest CT after treatment. 2. Left upper lobe/lingular volume loss with subpleural opacity suggestive of chronic atelectasis. 3. Trace bilateral pleural effusions. 4. Main pulmonary artery enlargement, which can be seen in the setting of pulmonary arterial hypertension. 5. Advanced atherosclerosis. 6. Small mediastinal lymph nodes, most likely reactive.   Electronically Signed   By: Logan Bores   On: 08/27/2013 11:33      Assessment/Plan:   Active Problems:   Chest pain  CT (noncontrast) IMPRESSION:  1. Left lower lobe greater than right upper lobe opacities,  concerning for multifocal infection. Right lower lobe ground-glass  opacity may reflect atelectasis or additional infection.  Radiographic follow-up recommended to ensure resolution with    consideration for short-term follow-up chest CT after treatment.  2. Left upper lobe/lingular volume loss with subpleural opacity  suggestive of chronic atelectasis.  3. Trace bilateral pleural effusions.  4. Main pulmonary artery enlargement, which can be seen in the  setting of pulmonary arterial hypertension.  5. Advanced atherosclerosis.  6. Small mediastinal lymph nodes, most likely reactive.     1. Chest pain; somewhat atypical for angina, but atherosclerosis noted on CT. Amlodipine added. 2. Severe Pulm HTN: PA pressure 82 mm HG by recent ECHO ;  For R heart cath today; coumadin held yesterday; INR today pending 3. CKD s/p kidney transplant, StageIV  4. HTN - uncontrolled. Will adjust BP after am meds are given  5. Remote PAF at time of kidney transplant . Currently in sinus  6. Antiphospholipid syndrome with h/o blood clot, on coumadin ,   7. Aortic sclerosis/mild AR     Troy Sine, MD, [facility] 08/28/2013, 11:32 AM

## 2013-08-28 NOTE — Progress Notes (Signed)
Subjective:  Continues to have intermittent back pain.  Objective:   Vital Signs in the last 24 hours: Temp:  [97.5 F (36.4 C)-98.3 F (36.8 C)] 98.2 F (36.8 C) (08/07 0514) Pulse Rate:  [65-73] 73 (08/07 0514) Resp:  [15-18] 15 (08/07 0514) BP: (146-155)/(64-66) 154/64 mmHg (08/07 0514) SpO2:  [98 %-99 %] 99 % (08/07 0514)  Intake/Output from previous day:    Medications: . [START ON 08/29/2013] aspirin  81 mg Oral Pre-Cath  . aspirin EC  81 mg Oral Daily  . atorvastatin  10 mg Oral Daily  . calcitRIOL  0.25 mcg Oral QODAY  . calcitRIOL  0.5 mcg Oral QODAY  . carvedilol  3.125 mg Oral BID WC  . famotidine  20 mg Oral BID  . isosorbide dinitrate  10 mg Oral TID  . magnesium oxide  400 mg Oral BID  . mycophenolate  180 mg Oral BID  . ranolazine  500 mg Oral BID  . sodium bicarbonate  650 mg Oral BID  . sodium chloride  3 mL Intravenous Q12H  . tacrolimus  3 mg Oral QHS  . tacrolimus  4 mg Oral q morning - 10a  . timolol  1 drop Both Eyes BID       Physical Exam:   General appearance: alert, cooperative and no distress Neck: no carotid bruit, no JVD, supple, symmetrical, trachea midline and thyroid not enlarged, symmetric, no tenderness/mass/nodules Lungs: decreased BS; no wheezing Heart: regular rate and rhythm and 2/6 sem Abdomen: soft, non-tender; bowel sounds normal; no masses,  no organomegaly Extremities: no edema, redness or tenderness in the calves or thighs Neurologic: Grossly normal   Rate: 82  Rhythm: normal sinus rhythm  Lab Results:   Recent Labs  08/27/13 0050 08/27/13 0710  NA 135* 132*  K 5.0 5.6*  CL 101 101  CO2 20 18*  GLUCOSE 127* 106*  BUN 30* 29*  CREATININE 2.62* 2.61*     Recent Labs  08/27/13 0050  TROPONINI <0.30    Hepatic Function Panel No results found for this basename: PROT, ALBUMIN, AST, ALT, ALKPHOS, BILITOT, BILIDIR, IBILI,  in the last 72 hours  Recent Labs  08/27/13 0050  INR 2.16*   BNP (last 3  results)  Recent Labs  08/27/13 0050  PROBNP 20481.0*    Lipid Panel     Component Value Date/Time   CHOL 146 08/25/2013 0150   TRIG 97 08/25/2013 0150   HDL 46 08/25/2013 0150   CHOLHDL 3.2 08/25/2013 0150   VLDL 19 08/25/2013 0150   LDLCALC 81 08/25/2013 0150      Imaging:  Dg Chest 2 View  08/27/2013   CLINICAL DATA:  Recurrent chest pain, getting worse.  EXAM: CHEST  2 VIEW  COMPARISON:  Chest radiograph August 24, 2013  FINDINGS: Worsening left mid and lower lung zone alveolar airspace opacities. Small left pleural effusion. Cardiac silhouette appears mildly enlarged, mediastinal silhouette is nonsuspicious. Central pulmonary vascular congestion persists. No pneumothorax.  Vascular clips and stents in the bilateral arms. Mild degenerative change of the thoracic spine. Surgical clips in the included right abdomen likely reflect cholecystectomy.  IMPRESSION: Cardiomegaly, central pulmonary vascular congestion.  Worsening right mid and lower lung zone airspace opacities may reflect confluent edema or possibly pneumonia, with small left pleural effusion. Recommend followup chest radiograph after treatment to verify improvement.   Electronically Signed   By: Elon Alas   On: 08/27/2013 03:23   Ct Chest Wo Contrast  08/27/2013  CLINICAL DATA:  Atypical chest pain.  EXAM: CT CHEST WITHOUT CONTRAST  TECHNIQUE: Multidetector CT imaging of the chest was performed following the standard protocol without IV contrast.  COMPARISON:  Chest radiographs 08/27/2013 and earlier  FINDINGS: No enlarged axillary or hilar lymph nodes are identified. There are multiple small mediastinal lymph nodes measuring up to 1.1 cm in short axis. Advanced, three-vessel coronary artery calcifications are noted. Mitral annular and mild aortic valve calcification is noted. The main pulmonary artery is enlarged, measuring 3.8 cm in diameter. There is trace pericardial fluid. Trace bilateral pleural effusions are present, right  larger than left.  Patchy, predominantly peribronchovascular ground-glass opacities are present in the right upper lobe. There is evidence of left upper lobe/lingular volume loss. Confluent opacity anteriorly in the left upper lobe/ lingula extending to the pleural surface may reflect atelectasis and scarring related to prior infection/inflammation. Two adjacent pleural calcifications are noted. Evaluation of the lung bases is partially limited by motion, however there is more extensive ground-glass and patchy consolidative opacity in the left lower lobe. Mild ground-glass opacity is present in the basilar right lower lobe. A small amount of pleural fluid is present in the left major fissure.  Cholecystectomy clips are partially visualized in the upper abdomen. Extensive upper abdominal atherosclerotic vascular calcification is noted. Mild thoracic spondylosis is noted.  IMPRESSION: 1. Left lower lobe greater than right upper lobe opacities, concerning for multifocal infection. Right lower lobe ground-glass opacity may reflect atelectasis or additional infection. Radiographic follow-up recommended to ensure resolution with consideration for short-term follow-up chest CT after treatment. 2. Left upper lobe/lingular volume loss with subpleural opacity suggestive of chronic atelectasis. 3. Trace bilateral pleural effusions. 4. Main pulmonary artery enlargement, which can be seen in the setting of pulmonary arterial hypertension. 5. Advanced atherosclerosis. 6. Small mediastinal lymph nodes, most likely reactive.   Electronically Signed   By: Logan Bores   On: 08/27/2013 11:33      Assessment/Plan:   Active Problems:   Chest pain  CT (noncontrast) IMPRESSION:  1. Left lower lobe greater than right upper lobe opacities,  concerning for multifocal infection. Right lower lobe ground-glass  opacity may reflect atelectasis or additional infection.  Radiographic follow-up recommended to ensure resolution with    consideration for short-term follow-up chest CT after treatment.  2. Left upper lobe/lingular volume loss with subpleural opacity  suggestive of chronic atelectasis.  3. Trace bilateral pleural effusions.  4. Main pulmonary artery enlargement, which can be seen in the  setting of pulmonary arterial hypertension.  5. Advanced atherosclerosis.  6. Small mediastinal lymph nodes, most likely reactive.     1. Chest pain; somewhat atypical for angina, but atherosclerosis noted on CT. Amlodipine added. 2. Severe Pulm HTN: PA pressure 82 mm HG by recent ECHO ;  For R heart cath today; coumadin held yesterday; INR today pending 3. CKD s/p kidney transplant, StageIV  4. HTN - uncontrolled. Will adjust BP after am meds are given  5. Remote PAF at time of kidney transplant . Currently in sinus  6. Antiphospholipid syndrome with h/o blood clot, on coumadin ,   7. Aortic sclerosis/mild AR     Troy Sine, MD, [facility] 08/28/2013, 11:32 AM

## 2013-08-28 NOTE — Interval H&P Note (Signed)
History and Physical Interval Note:  08/28/2013 2:09 PM Right heart cath discussed. Debbie [name]  has presented today for surgery, with the diagnosis of pulmonary hypertension  The various methods of treatment have been discussed with the patient and family. After consideration of risks, benefits and other options for treatment, the patient has consented to  Procedure(s): RIGHT HEART CATH (N/A) as a surgical intervention .  The patient's history has been reviewed, patient examined, no change in status, stable for surgery.  I have reviewed the patient's chart and labs.  Questions were answered to the patient's satisfaction.     Sinclair Grooms

## 2013-08-28 NOTE — CV Procedure (Signed)
     Right Heart Catheterization Report  Debbie [name]  63 y.o.  female 18-Apr-1950  Procedure Date: 08/28/2013 Referring Physician:  Ellouise Newer, M.D. Primary Cardiologist: Fransico Him, M.D.  INDICATIONS: Pulmonary hypertension  PROCEDURE: Right heart catheterization  CONSENT:  The risks, benefits, and details of the procedure were explained in detail to the patient. Risks including death, stroke, heart attack, kidney injury, allergy, limb ischemia, bleeding and radiation injury were discussed.  The patient verbalized understanding and wanted to proceed.  Informed written consent was obtained.  PROCEDURE TECHNIQUE:  After Xylocaine anesthesia a 7 French sheath was placed in the right femoral vein using the the modified Seldinger technique.  A 7 French Swan-Ganz catheter was used for pressure recording and oximetry sample. Fluoroscopic guidance was used. No complications occurred.  Hemostasis was achieved with manual compression.   CONTRAST:  Total of 0 cc.  COMPLICATIONS:  None   HEMODYNAMICS: RA 14 mm mercury; RV 76/18 mmHg; PA 73/31 mm mercury; PCWP(mean) 24 mm mercury; Cardiac Output 7.57 L per minute (Fick); main pulmonary artery saturation 70%; aortic O2 sat 95%  ANGIOGRAPHIC DATA:   None   LEFT VENTRICULOGRAM: Not performed   IMPRESSIONS:  Severe pulmonary hypertension with elevated pulmonary capillary wedge mean pressure of 24 mm mercury   RECOMMENDATION:  Further diuresis/volume removal may help reduce pulmonary pressure although it appears to be out of proportion to left heart failure.

## 2013-08-29 DIAGNOSIS — E785 Hyperlipidemia, unspecified: Secondary | ICD-10-CM | POA: Diagnosis present

## 2013-08-29 DIAGNOSIS — Z992 Dependence on renal dialysis: Secondary | ICD-10-CM | POA: Diagnosis not present

## 2013-08-29 DIAGNOSIS — H409 Unspecified glaucoma: Secondary | ICD-10-CM | POA: Diagnosis present

## 2013-08-29 DIAGNOSIS — N179 Acute kidney failure, unspecified: Secondary | ICD-10-CM | POA: Diagnosis not present

## 2013-08-29 DIAGNOSIS — Z8701 Personal history of pneumonia (recurrent): Secondary | ICD-10-CM | POA: Diagnosis not present

## 2013-08-29 DIAGNOSIS — Z883 Allergy status to other anti-infective agents status: Secondary | ICD-10-CM | POA: Diagnosis not present

## 2013-08-29 DIAGNOSIS — G5600 Carpal tunnel syndrome, unspecified upper limb: Secondary | ICD-10-CM | POA: Diagnosis present

## 2013-08-29 DIAGNOSIS — Z792 Long term (current) use of antibiotics: Secondary | ICD-10-CM | POA: Diagnosis not present

## 2013-08-29 DIAGNOSIS — J9819 Other pulmonary collapse: Secondary | ICD-10-CM | POA: Diagnosis present

## 2013-08-29 DIAGNOSIS — I120 Hypertensive chronic kidney disease with stage 5 chronic kidney disease or end stage renal disease: Secondary | ICD-10-CM | POA: Diagnosis present

## 2013-08-29 DIAGNOSIS — Z86010 Personal history of colonic polyps: Secondary | ICD-10-CM | POA: Diagnosis not present

## 2013-08-29 DIAGNOSIS — I700 Atherosclerosis of aorta: Secondary | ICD-10-CM | POA: Diagnosis present

## 2013-08-29 DIAGNOSIS — M19049 Primary osteoarthritis, unspecified hand: Secondary | ICD-10-CM | POA: Diagnosis present

## 2013-08-29 DIAGNOSIS — Z79899 Other long term (current) drug therapy: Secondary | ICD-10-CM | POA: Diagnosis not present

## 2013-08-29 DIAGNOSIS — K649 Unspecified hemorrhoids: Secondary | ICD-10-CM | POA: Diagnosis present

## 2013-08-29 DIAGNOSIS — I4891 Unspecified atrial fibrillation: Secondary | ICD-10-CM | POA: Diagnosis present

## 2013-08-29 DIAGNOSIS — N186 End stage renal disease: Secondary | ICD-10-CM | POA: Diagnosis present

## 2013-08-29 DIAGNOSIS — Z9889 Other specified postprocedural states: Secondary | ICD-10-CM | POA: Diagnosis not present

## 2013-08-29 DIAGNOSIS — Z7982 Long term (current) use of aspirin: Secondary | ICD-10-CM | POA: Diagnosis not present

## 2013-08-29 DIAGNOSIS — K8020 Calculus of gallbladder without cholecystitis without obstruction: Secondary | ICD-10-CM | POA: Diagnosis present

## 2013-08-29 DIAGNOSIS — N289 Disorder of kidney and ureter, unspecified: Secondary | ICD-10-CM | POA: Diagnosis present

## 2013-08-29 DIAGNOSIS — Z880 Allergy status to penicillin: Secondary | ICD-10-CM | POA: Diagnosis not present

## 2013-08-29 DIAGNOSIS — Z86718 Personal history of other venous thrombosis and embolism: Secondary | ICD-10-CM | POA: Diagnosis not present

## 2013-08-29 DIAGNOSIS — I209 Angina pectoris, unspecified: Secondary | ICD-10-CM | POA: Diagnosis present

## 2013-08-29 DIAGNOSIS — K222 Esophageal obstruction: Secondary | ICD-10-CM | POA: Diagnosis present

## 2013-08-29 DIAGNOSIS — R0789 Other chest pain: Secondary | ICD-10-CM | POA: Diagnosis present

## 2013-08-29 DIAGNOSIS — K219 Gastro-esophageal reflux disease without esophagitis: Secondary | ICD-10-CM | POA: Diagnosis present

## 2013-08-29 DIAGNOSIS — D6859 Other primary thrombophilia: Secondary | ICD-10-CM | POA: Diagnosis present

## 2013-08-29 DIAGNOSIS — Z940 Kidney transplant status: Secondary | ICD-10-CM | POA: Diagnosis not present

## 2013-08-29 DIAGNOSIS — I2510 Atherosclerotic heart disease of native coronary artery without angina pectoris: Secondary | ICD-10-CM | POA: Diagnosis present

## 2013-08-29 DIAGNOSIS — I158 Other secondary hypertension: Secondary | ICD-10-CM | POA: Diagnosis present

## 2013-08-29 DIAGNOSIS — J189 Pneumonia, unspecified organism: Secondary | ICD-10-CM | POA: Diagnosis present

## 2013-08-29 DIAGNOSIS — Z87891 Personal history of nicotine dependence: Secondary | ICD-10-CM | POA: Diagnosis not present

## 2013-08-29 DIAGNOSIS — Z7901 Long term (current) use of anticoagulants: Secondary | ICD-10-CM | POA: Diagnosis not present

## 2013-08-29 DIAGNOSIS — M549 Dorsalgia, unspecified: Secondary | ICD-10-CM | POA: Diagnosis present

## 2013-08-29 DIAGNOSIS — I2789 Other specified pulmonary heart diseases: Secondary | ICD-10-CM | POA: Diagnosis present

## 2013-08-29 LAB — BASIC METABOLIC PANEL
Anion gap: 16 — ABNORMAL HIGH (ref 5–15)
BUN: 31 mg/dL — ABNORMAL HIGH (ref 6–23)
CHLORIDE: 100 meq/L (ref 96–112)
CO2: 18 meq/L — AB (ref 19–32)
Calcium: 9.6 mg/dL (ref 8.4–10.5)
Creatinine, Ser: 2.68 mg/dL — ABNORMAL HIGH (ref 0.50–1.10)
GFR calc Af Amer: 20 mL/min — ABNORMAL LOW (ref >90)
GFR calc non Af Amer: 18 mL/min — ABNORMAL LOW (ref >90)
Glucose, Bld: 98 mg/dL (ref 70–99)
Potassium: 5 mEq/L (ref 3.7–5.3)
Sodium: 134 mEq/L — ABNORMAL LOW (ref 137–147)

## 2013-08-29 LAB — PROTIME-INR
INR: 1.81 — ABNORMAL HIGH (ref 0.00–1.49)
Prothrombin Time: 21 seconds — ABNORMAL HIGH (ref 11.6–15.2)

## 2013-08-29 MED ORDER — WARFARIN 1.25 MG HALF TABLET
1.2500 mg | ORAL_TABLET | Freq: Once | ORAL | Status: AC
  Administered 2013-08-29: 1.25 mg via ORAL
  Filled 2013-08-29: qty 1

## 2013-08-29 NOTE — Progress Notes (Signed)
Patient Name: Debbie [name] Date of Encounter: 08/29/2013     Active Problems:   Chest pain    SUBJECTIVE  The patient is doing well following a right heart catheter yesterday.  Denies chest pain or shortness of breath.  Coumadin is being restarted.  She has a history of antiphospholipid syndrome.  CURRENT MEDS . amLODipine  5 mg Oral Daily  . aspirin EC  81 mg Oral Daily  . atorvastatin  10 mg Oral Daily  . calcitRIOL  0.25 mcg Oral QODAY  . calcitRIOL  0.5 mcg Oral QODAY  . carvedilol  3.125 mg Oral BID WC  . famotidine  20 mg Oral Daily  . isosorbide dinitrate  10 mg Oral TID  . magnesium oxide  400 mg Oral BID  . mycophenolate  180 mg Oral BID  . ranolazine  500 mg Oral BID  . sodium bicarbonate  650 mg Oral BID  . sodium chloride  3 mL Intravenous Q12H  . tacrolimus  3 mg Oral QHS  . tacrolimus  4 mg Oral q morning - 10a  . timolol  1 drop Both Eyes BID  . warfarin  1.25 mg Oral ONCE-1800  . Warfarin - Pharmacist Dosing Inpatient   Does not apply q1800    OBJECTIVE  Filed Vitals:   08/28/13 2100 08/29/13 0500 08/29/13 0936 08/29/13 0940  BP: 160/71 148/60 140/64 140/64  Pulse: 73 77  75  Temp: 98.8 F (37.1 C) 98.6 F (37 C)    TempSrc:      Resp: 18 16    Height:      Weight:  165 lb 3.2 oz (74.934 kg)    SpO2: 97% 94%      Intake/Output Summary (Last 24 hours) at 08/29/13 1151 Last data filed at 08/29/13 0900  Gross per 24 hour  Intake    360 ml  Output      0 ml  Net    360 ml   Filed Weights   08/27/13 0026 08/29/13 0500  Weight: 169 lb (76.658 kg) 165 lb 3.2 oz (74.934 kg)    PHYSICAL EXAM  General: Pleasant, NAD. Neuro: Alert and oriented X 3. Moves all extremities spontaneously. Psych: Normal affect. HEENT:  Normal  Neck: Supple without bruits.  Jugular venous pressure is elevated Lungs:  Resp regular and unlabored, CTA.  No wheezing Heart: RRR no s3, s4, there is a grade 2/6 systolic murmur at the lower left sternal edge. Abdomen:  Soft, non-tender, non-distended, BS + x 4.  Left groin is stable post right heart catheter Extremities: No clubbing, cyanosis or edema. DP/PT/Radials 2+ and equal bilaterally.  Accessory Clinical Findings  CBC  Recent Labs  08/27/13 0710 08/28/13 1310  WBC 5.9 6.8  NEUTROABS  --  3.6  HGB 9.1* 9.5*  HCT 27.2* 28.1*  MCV 81.7 81.2  PLT 167 725   Basic Metabolic Panel  Recent Labs  08/28/13 1310 08/29/13 0420  NA 134* 134*  K 6.1* 5.0  CL 100 100  CO2 20 18*  GLUCOSE 101* 98  BUN 30* 31*  CREATININE 2.63* 2.68*  CALCIUM 9.9 9.6   Liver Function Tests No results found for this basename: AST, ALT, ALKPHOS, BILITOT, PROT, ALBUMIN,  in the last 72 hours No results found for this basename: LIPASE, AMYLASE,  in the last 72 hours Cardiac Enzymes  Recent Labs  08/27/13 0050  TROPONINI <0.30   BNP No components found with this basename: POCBNP,  D-Dimer No  results found for this basename: DDIMER,  in the last 72 hours Hemoglobin A1C No results found for this basename: HGBA1C,  in the last 72 hours Fasting Lipid Panel No results found for this basename: CHOL, HDL, LDLCALC, TRIG, CHOLHDL, LDLDIRECT,  in the last 72 hours Thyroid Function Tests No results found for this basename: TSH, T4TOTAL, FREET3, T3FREE, THYROIDAB,  in the last 72 hours  TELE  Normal sinus rhythm  ECG    Radiology/Studies  Dg Chest 2 View  08/27/2013   CLINICAL DATA:  Recurrent chest pain, getting worse.  EXAM: CHEST  2 VIEW  COMPARISON:  Chest radiograph August 24, 2013  FINDINGS: Worsening left mid and lower lung zone alveolar airspace opacities. Small left pleural effusion. Cardiac silhouette appears mildly enlarged, mediastinal silhouette is nonsuspicious. Central pulmonary vascular congestion persists. No pneumothorax.  Vascular clips and stents in the bilateral arms. Mild degenerative change of the thoracic spine. Surgical clips in the included right abdomen likely reflect cholecystectomy.   IMPRESSION: Cardiomegaly, central pulmonary vascular congestion.  Worsening right mid and lower lung zone airspace opacities may reflect confluent edema or possibly pneumonia, with small left pleural effusion. Recommend followup chest radiograph after treatment to verify improvement.   Electronically Signed   By: Elon Alas   On: 08/27/2013 03:23   Dg Chest 2 View  08/24/2013   CLINICAL DATA:  Chest pain  EXAM: CHEST  2 VIEW  COMPARISON:  08/20/2013  FINDINGS: Heart is borderline in size. Continued left lower lobe airspace opacity concerning for pneumonia. Small left pleural effusion. Increasing patchy airspace opacity on the right.  No acute bony abnormality.  IMPRESSION: Stable patchy left lower lobe airspace disease with small left effusion. Increasing patchy right basilar airspace opacity. Findings concerning for multifocal pneumonia.   Electronically Signed   By: Rolm Baptise M.D.   On: 08/24/2013 10:27   Dg Chest 2 View  08/20/2013   CLINICAL DATA:  Pulmonary embolism.  EXAM: CHEST  2 VIEW  COMPARISON:  06/30/2013.  FINDINGS: The cardiopericardial silhouette is mildly enlarged for projection. There is airspace disease in the LEFT lower lobe obscuring portions of the LEFT heart border. Small LEFT pleural effusion is present. LEFT basilar atelectasis.  Pulmonary vascular congestion is present. Scarring is present in the LEFT mid lung.  Chronic fullness of the pulmonary hila. LEFT pleural effusion appears larger than on prior examinations.  IMPRESSION: 1. LEFT lower lobe airspace disease favored to represent pneumonia or aspiration rather than asymmetric pulmonary edema. Followup in 4-6 weeks to ensure radiographic clearing and exclude an underlying lesion is recommended. 2. Mild cardiomegaly. 3. Pulmonary vascular congestion.   Electronically Signed   By: Dereck Ligas M.D.   On: 08/20/2013 15:12   Ct Chest Wo Contrast  08/27/2013   CLINICAL DATA:  Atypical chest pain.  EXAM: CT CHEST WITHOUT  CONTRAST  TECHNIQUE: Multidetector CT imaging of the chest was performed following the standard protocol without IV contrast.  COMPARISON:  Chest radiographs 08/27/2013 and earlier  FINDINGS: No enlarged axillary or hilar lymph nodes are identified. There are multiple small mediastinal lymph nodes measuring up to 1.1 cm in short axis. Advanced, three-vessel coronary artery calcifications are noted. Mitral annular and mild aortic valve calcification is noted. The main pulmonary artery is enlarged, measuring 3.8 cm in diameter. There is trace pericardial fluid. Trace bilateral pleural effusions are present, right larger than left.  Patchy, predominantly peribronchovascular ground-glass opacities are present in the right upper lobe. There is evidence of  left upper lobe/lingular volume loss. Confluent opacity anteriorly in the left upper lobe/ lingula extending to the pleural surface may reflect atelectasis and scarring related to prior infection/inflammation. Two adjacent pleural calcifications are noted. Evaluation of the lung bases is partially limited by motion, however there is more extensive ground-glass and patchy consolidative opacity in the left lower lobe. Mild ground-glass opacity is present in the basilar right lower lobe. A small amount of pleural fluid is present in the left major fissure.  Cholecystectomy clips are partially visualized in the upper abdomen. Extensive upper abdominal atherosclerotic vascular calcification is noted. Mild thoracic spondylosis is noted.  IMPRESSION: 1. Left lower lobe greater than right upper lobe opacities, concerning for multifocal infection. Right lower lobe ground-glass opacity may reflect atelectasis or additional infection. Radiographic follow-up recommended to ensure resolution with consideration for short-term follow-up chest CT after treatment. 2. Left upper lobe/lingular volume loss with subpleural opacity suggestive of chronic atelectasis. 3. Trace bilateral  pleural effusions. 4. Main pulmonary artery enlargement, which can be seen in the setting of pulmonary arterial hypertension. 5. Advanced atherosclerosis. 6. Small mediastinal lymph nodes, most likely reactive.   Electronically Signed   By: Logan Bores   On: 08/27/2013 11:33   Nm Pulmonary Perf And Vent  08/20/2013   CLINICAL DATA:  Shortness of breath and chest pain. History of renal transplant. Atrial fibrillation.  EXAM: NUCLEAR MEDICINE VENTILATION - PERFUSION LUNG SCAN  TECHNIQUE: Ventilation images were obtained in multiple projections using inhaled aerosol technetium 99 M DTPA. Perfusion images were obtained in multiple projections after intravenous injection of Tc-49m MAA.  RADIOPHARMACEUTICALS:  40.0 mCi Tc-45m DTPA aerosol and 6.0 mCi Tc-66m MAA  COMPARISON:  Chest radiograph of earlier in the day.  FINDINGS: Ventilation: Heterogeneous tracer distribution. No wedge-shaped ventilation defects. Vague decreased ventilation to the lingula and left upper lobe, possibly related to airspace disease on today's chest radiograph.  Perfusion: No wedge shaped peripheral perfusion defects to suggest acute pulmonary embolism.  IMPRESSION: No evidence of pulmonary embolism.   Electronically Signed   By: Abigail Miyamoto M.D.   On: 08/20/2013 15:44    ASSESSMENT AND PLAN 1. Chest pain; somewhat atypical for angina, but atherosclerosis noted on CT. Amlodipine added.  2. Severe Pulm HTN: PA pressure 82 mm HG by recent ECHO ; right heart catheterization on 08/28/13 showed pulmonary artery pressure 73/31 and a mean pulmonary capillary wedge pressure of 24.  Further diuresis/volume removal was recommended to help reduce pulmonary pressure although the pulmonary hypertension appeared to be out of proportion to the left heart failure.  Her Coumadin coumadin has been restarted. 3. CKD s/p kidney transplant, StageIV . 4. HTN - uncontrolled. Will adjust BP after am meds are given  5. Remote PAF at time of kidney transplant .  Currently in sinus  6. Antiphospholipid syndrome with h/o blood clot, on coumadin ,  7. Aortic sclerosis/mild AR   Plan: Continue to follow renal function closely.  She is not currently on daily diuretic.  Her last creatinine was trending upward.  At home she takes low-dose furosemide only as needed for significant peripheral edema.  Signed, Darlin Coco MD

## 2013-08-29 NOTE — Progress Notes (Addendum)
ANTICOAGULATION CONSULT NOTE - Initial Consult  Pharmacy Consult for coumadin Indication: Antiphospholipid syndrome with h/o blood clot, afib   Allergies  Allergen Reactions  . Gentamicin Itching    unknown  . Gentamycin [Gentamicin Sulfate] Itching and Swelling  . Penicillins Itching  . Vancomycin Itching and Swelling    Patient Measurements: Height: 5\' 1"  (154.9 cm) Weight: 165 lb 3.2 oz (74.934 kg) IBW/kg (Calculated) : 47.8  Vital Signs: Temp: 98.6 F (37 C) (08/08 0500) BP: 140/64 mmHg (08/08 0940) Pulse Rate: 75 (08/08 0940)  Labs:  Recent Labs  08/27/13 0050 08/27/13 0710 08/28/13 1310 08/29/13 0420  HGB 9.1* 9.1* 9.5*  --   HCT 27.1* 27.2* 28.1*  --   PLT 165 167 173  --   LABPROT 24.1*  --  20.3* 21.0*  INR 2.16*  --  1.73* 1.81*  CREATININE 2.62* 2.61* 2.63* 2.68*  TROPONINI <0.30  --   --   --     Estimated Creatinine Clearance: 19.6 ml/min (by C-G formula based on Cr of 2.68).   Medical History: Past Medical History  Diagnosis Date  . Kidney transplant as cause of abnormal reaction or later complication   . Hypertension   . Colon polyps   . Gall stones 1980  . Anti-phospholipid antibody syndrome     (Based on hospital discharge summary march 2013)  . Atrial fibrillation   . Carpal tunnel syndrome 2003    lt  . Esophageal stricture   . Hemorrhoids   . Renal disorder   . ESRD (end stage renal disease) on dialysis     "til I got my transplant in 2010"  . DVT (deep venous thrombosis) 1990    LLE  . History of blood transfusion     "related to the lupus"  . Dyslipidemia 07/22/2013  . Coronary artery disease   . Heart murmur   . Pneumonia     "3 times now" (08/25/2013)  . GERD (gastroesophageal reflux disease)   . Arthritis     "fingers" (08/25/2013)  . Glaucoma, left eye     Medications:  Prescriptions prior to admission  Medication Sig Dispense Refill  . aspirin EC 81 MG tablet Take 81 mg by mouth daily.      Marland Kitchen atorvastatin (LIPITOR)  10 MG tablet Take 1 tablet (10 mg total) by mouth daily.  30 tablet  11  . calcitRIOL (ROCALTROL) 0.25 MCG capsule Take 0.25-0.5 mcg by mouth daily. Take 0.25 mcg daily alternating with 0.5 mcg      . carvedilol (COREG) 3.125 MG tablet Take 1 tablet (3.125 mg total) by mouth 2 (two) times daily with a meal.  60 tablet  11  . furosemide (LASIX) 20 MG tablet Take 20 mg by mouth daily as needed. Excess edema      . isosorbide dinitrate (ISORDIL) 10 MG tablet Take 1 tablet (10 mg total) by mouth 3 (three) times daily.  90 tablet  3  . LUMIGAN 0.01 % SOLN Place 1 drop into both eyes at bedtime.       . magnesium oxide (MAG-OX) 400 (241.3 MG) MG tablet Take 1 tablet (400 mg total) by mouth 2 (two) times daily.  60 tablet  3  . mycophenolate (MYFORTIC) 180 MG EC tablet Take 180 mg by mouth 2 (two) times daily.       . nitroGLYCERIN (NITROSTAT) 0.4 MG SL tablet Place 1 tablet (0.4 mg total) under the tongue every 5 (five) minutes x 3 doses as needed  for chest pain.  25 tablet  12  . ranitidine (ZANTAC) 150 MG tablet Take 150 mg by mouth daily.       . ranolazine (RANEXA) 500 MG 12 hr tablet Take 1 tablet (500 mg total) by mouth 2 (two) times daily.  60 tablet  3  . sodium bicarbonate 650 MG tablet Take 650 mg by mouth 2 (two) times daily.      . tacrolimus (PROGRAF) 1 MG capsule Take 3-4 mg by mouth See admin instructions. 4 caps in am and 3 caps in pm.      . timolol (BETIMOL) 0.5 % ophthalmic solution Place 1 drop into both eyes 2 (two) times daily.      Marland Kitchen warfarin (COUMADIN) 2.5 MG tablet Take 1.25-2.5 mg by mouth every evening. Alternating between 1.25mg  and 2.5mg  daily.      . ALPHAGAN P 0.1 % SOLN Place 1 drop into both eyes at bedtime.        Scheduled:  . amLODipine  5 mg Oral Daily  . aspirin EC  81 mg Oral Daily  . atorvastatin  10 mg Oral Daily  . calcitRIOL  0.25 mcg Oral QODAY  . calcitRIOL  0.5 mcg Oral QODAY  . carvedilol  3.125 mg Oral BID WC  . famotidine  20 mg Oral Daily  .  isosorbide dinitrate  10 mg Oral TID  . magnesium oxide  400 mg Oral BID  . mycophenolate  180 mg Oral BID  . ranolazine  500 mg Oral BID  . sodium bicarbonate  650 mg Oral BID  . sodium chloride  3 mL Intravenous Q12H  . tacrolimus  3 mg Oral QHS  . tacrolimus  4 mg Oral q morning - 10a  . timolol  1 drop Both Eyes BID  . warfarin  1.25 mg Oral ONCE-1800  . Warfarin - Pharmacist Dosing Inpatient   Does not apply q1800    Assessment: 63 yo female with antiphospholipid syndrome with h/o blood clot and history of afib now s/p RHC to restart coumadin. INR today= 1.81.  No signs of bleeding.  Home coumadin dose: alternates 1.25mg  and 2.5mg  daily  Goal of Therapy:  INR 2-3 Monitor platelets by anticoagulation protocol: Yes   Plan:  - Coumadin 1.25 mg po today - Daily PT/INR - Monitor for s/sx of bleeding  Hassie Bruce, Pharm. D. Clinical Pharmacy Resident Pager: [phone] Ph: [phone] 08/29/2013 9:59 AM

## 2013-08-30 ENCOUNTER — Inpatient Hospital Stay (HOSPITAL_COMMUNITY): Payer: Medicare Other

## 2013-08-30 LAB — BASIC METABOLIC PANEL
Anion gap: 14 (ref 5–15)
BUN: 28 mg/dL — ABNORMAL HIGH (ref 6–23)
CHLORIDE: 100 meq/L (ref 96–112)
CO2: 22 mEq/L (ref 19–32)
Calcium: 9.6 mg/dL (ref 8.4–10.5)
Creatinine, Ser: 2.59 mg/dL — ABNORMAL HIGH (ref 0.50–1.10)
GFR calc Af Amer: 21 mL/min — ABNORMAL LOW (ref >90)
GFR calc non Af Amer: 18 mL/min — ABNORMAL LOW (ref >90)
GLUCOSE: 95 mg/dL (ref 70–99)
POTASSIUM: 4.8 meq/L (ref 3.7–5.3)
SODIUM: 136 meq/L — AB (ref 137–147)

## 2013-08-30 LAB — GLUCOSE, CAPILLARY: GLUCOSE-CAPILLARY: 136 mg/dL — AB (ref 70–99)

## 2013-08-30 LAB — PROTIME-INR
INR: 2.02 — ABNORMAL HIGH (ref 0.00–1.49)
Prothrombin Time: 22.9 seconds — ABNORMAL HIGH (ref 11.6–15.2)

## 2013-08-30 MED ORDER — FUROSEMIDE 20 MG PO TABS
20.0000 mg | ORAL_TABLET | Freq: Once | ORAL | Status: AC
  Administered 2013-08-30: 20 mg via ORAL
  Filled 2013-08-30: qty 1

## 2013-08-30 MED ORDER — WARFARIN SODIUM 2.5 MG PO TABS
2.5000 mg | ORAL_TABLET | Freq: Once | ORAL | Status: AC
  Administered 2013-08-30: 2.5 mg via ORAL
  Filled 2013-08-30: qty 1

## 2013-08-30 NOTE — Progress Notes (Signed)
Patient Name: Debbie [name] Date of Encounter: 08/30/2013     Active Problems:   Chest pain   AKI (acute kidney injury)    SUBJECTIVE  The patient has not had any chest pain.  She still has a dry nonproductive cough.  CURRENT MEDS . amLODipine  5 mg Oral Daily  . aspirin EC  81 mg Oral Daily  . atorvastatin  10 mg Oral Daily  . calcitRIOL  0.25 mcg Oral QODAY  . calcitRIOL  0.5 mcg Oral QODAY  . carvedilol  3.125 mg Oral BID WC  . famotidine  20 mg Oral Daily  . isosorbide dinitrate  10 mg Oral TID  . magnesium oxide  400 mg Oral BID  . mycophenolate  180 mg Oral BID  . ranolazine  500 mg Oral BID  . sodium bicarbonate  650 mg Oral BID  . sodium chloride  3 mL Intravenous Q12H  . tacrolimus  3 mg Oral QHS  . tacrolimus  4 mg Oral q morning - 10a  . timolol  1 drop Both Eyes BID  . warfarin  2.5 mg Oral ONCE-1800  . Warfarin - Pharmacist Dosing Inpatient   Does not apply q1800    OBJECTIVE  Filed Vitals:   08/29/13 1553 08/29/13 1639 08/29/13 2100 08/30/13 0500  BP: 120/66 120/59 134/62 149/66  Pulse: 71 66 73 77  Temp:   98.1 F (36.7 C) 98 F (36.7 C)  TempSrc:      Resp: 17  16 16   Height:      Weight:    164 lb 14.4 oz (74.798 kg)  SpO2: 94%  97% 93%    Intake/Output Summary (Last 24 hours) at 08/30/13 1104 Last data filed at 08/30/13 0500  Gross per 24 hour  Intake    400 ml  Output      0 ml  Net    400 ml   Filed Weights   08/27/13 0026 08/29/13 0500 08/30/13 0500  Weight: 169 lb (76.658 kg) 165 lb 3.2 oz (74.934 kg) 164 lb 14.4 oz (74.798 kg)    PHYSICAL EXAM  General: Pleasant, NAD. Neuro: Alert and oriented X 3. Moves all extremities spontaneously. Psych: Normal affect. HEENT:  Normal  Neck: Supple without bruits or JVD. Lungs:  Resp regular and unlabored, mild decrease in breath sounds at the left base. Heart: RRR no s3, s4, there is a soft systolic murmur at the left sternal edge. Abdomen: Soft, non-tender, non-distended, BS + x 4.   Extremities: There is mild ankle edema today. DP/PT/Radials 2+ and equal bilaterally.  Accessory Clinical Findings  CBC  Recent Labs  08/28/13 1310  WBC 6.8  NEUTROABS 3.6  HGB 9.5*  HCT 28.1*  MCV 81.2  PLT 539   Basic Metabolic Panel  Recent Labs  08/29/13 0420 08/30/13 0415  NA 134* 136*  K 5.0 4.8  CL 100 100  CO2 18* 22  GLUCOSE 98 95  BUN 31* 28*  CREATININE 2.68* 2.59*  CALCIUM 9.6 9.6   Liver Function Tests No results found for this basename: AST, ALT, ALKPHOS, BILITOT, PROT, ALBUMIN,  in the last 72 hours No results found for this basename: LIPASE, AMYLASE,  in the last 72 hours Cardiac Enzymes No results found for this basename: CKTOTAL, CKMB, CKMBINDEX, TROPONINI,  in the last 72 hours BNP No components found with this basename: POCBNP,  D-Dimer No results found for this basename: DDIMER,  in the last 72 hours Hemoglobin A1C No  results found for this basename: HGBA1C,  in the last 72 hours Fasting Lipid Panel No results found for this basename: CHOL, HDL, LDLCALC, TRIG, CHOLHDL, LDLDIRECT,  in the last 72 hours Thyroid Function Tests No results found for this basename: TSH, T4TOTAL, FREET3, T3FREE, THYROIDAB,  in the last 72 hours  TELE  Normal sinus rhythm  ECG     Radiology/Studies  Dg Chest 2 View  08/27/2013   CLINICAL DATA:  Recurrent chest pain, getting worse.  EXAM: CHEST  2 VIEW  COMPARISON:  Chest radiograph August 24, 2013  FINDINGS: Worsening left mid and lower lung zone alveolar airspace opacities. Small left pleural effusion. Cardiac silhouette appears mildly enlarged, mediastinal silhouette is nonsuspicious. Central pulmonary vascular congestion persists. No pneumothorax.  Vascular clips and stents in the bilateral arms. Mild degenerative change of the thoracic spine. Surgical clips in the included right abdomen likely reflect cholecystectomy.  IMPRESSION: Cardiomegaly, central pulmonary vascular congestion.  Worsening right mid and  lower lung zone airspace opacities may reflect confluent edema or possibly pneumonia, with small left pleural effusion. Recommend followup chest radiograph after treatment to verify improvement.   Electronically Signed   By: Elon Alas   On: 08/27/2013 03:23   Dg Chest 2 View  08/24/2013   CLINICAL DATA:  Chest pain  EXAM: CHEST  2 VIEW  COMPARISON:  08/20/2013  FINDINGS: Heart is borderline in size. Continued left lower lobe airspace opacity concerning for pneumonia. Small left pleural effusion. Increasing patchy airspace opacity on the right.  No acute bony abnormality.  IMPRESSION: Stable patchy left lower lobe airspace disease with small left effusion. Increasing patchy right basilar airspace opacity. Findings concerning for multifocal pneumonia.   Electronically Signed   By: Rolm Baptise M.D.   On: 08/24/2013 10:27   Dg Chest 2 View  08/20/2013   CLINICAL DATA:  Pulmonary embolism.  EXAM: CHEST  2 VIEW  COMPARISON:  06/30/2013.  FINDINGS: The cardiopericardial silhouette is mildly enlarged for projection. There is airspace disease in the LEFT lower lobe obscuring portions of the LEFT heart border. Small LEFT pleural effusion is present. LEFT basilar atelectasis.  Pulmonary vascular congestion is present. Scarring is present in the LEFT mid lung.  Chronic fullness of the pulmonary hila. LEFT pleural effusion appears larger than on prior examinations.  IMPRESSION: 1. LEFT lower lobe airspace disease favored to represent pneumonia or aspiration rather than asymmetric pulmonary edema. Followup in 4-6 weeks to ensure radiographic clearing and exclude an underlying lesion is recommended. 2. Mild cardiomegaly. 3. Pulmonary vascular congestion.   Electronically Signed   By: Dereck Ligas M.D.   On: 08/20/2013 15:12   Ct Chest Wo Contrast  08/27/2013   CLINICAL DATA:  Atypical chest pain.  EXAM: CT CHEST WITHOUT CONTRAST  TECHNIQUE: Multidetector CT imaging of the chest was performed following the  standard protocol without IV contrast.  COMPARISON:  Chest radiographs 08/27/2013 and earlier  FINDINGS: No enlarged axillary or hilar lymph nodes are identified. There are multiple small mediastinal lymph nodes measuring up to 1.1 cm in short axis. Advanced, three-vessel coronary artery calcifications are noted. Mitral annular and mild aortic valve calcification is noted. The main pulmonary artery is enlarged, measuring 3.8 cm in diameter. There is trace pericardial fluid. Trace bilateral pleural effusions are present, right larger than left.  Patchy, predominantly peribronchovascular ground-glass opacities are present in the right upper lobe. There is evidence of left upper lobe/lingular volume loss. Confluent opacity anteriorly in the left upper lobe/ lingula  extending to the pleural surface may reflect atelectasis and scarring related to prior infection/inflammation. Two adjacent pleural calcifications are noted. Evaluation of the lung bases is partially limited by motion, however there is more extensive ground-glass and patchy consolidative opacity in the left lower lobe. Mild ground-glass opacity is present in the basilar right lower lobe. A small amount of pleural fluid is present in the left major fissure.  Cholecystectomy clips are partially visualized in the upper abdomen. Extensive upper abdominal atherosclerotic vascular calcification is noted. Mild thoracic spondylosis is noted.  IMPRESSION: 1. Left lower lobe greater than right upper lobe opacities, concerning for multifocal infection. Right lower lobe ground-glass opacity may reflect atelectasis or additional infection. Radiographic follow-up recommended to ensure resolution with consideration for short-term follow-up chest CT after treatment. 2. Left upper lobe/lingular volume loss with subpleural opacity suggestive of chronic atelectasis. 3. Trace bilateral pleural effusions. 4. Main pulmonary artery enlargement, which can be seen in the setting of  pulmonary arterial hypertension. 5. Advanced atherosclerosis. 6. Small mediastinal lymph nodes, most likely reactive.   Electronically Signed   By: Logan Bores   On: 08/27/2013 11:33   Nm Pulmonary Perf And Vent  08/20/2013   CLINICAL DATA:  Shortness of breath and chest pain. History of renal transplant. Atrial fibrillation.  EXAM: NUCLEAR MEDICINE VENTILATION - PERFUSION LUNG SCAN  TECHNIQUE: Ventilation images were obtained in multiple projections using inhaled aerosol technetium 99 M DTPA. Perfusion images were obtained in multiple projections after intravenous injection of Tc-70m MAA.  RADIOPHARMACEUTICALS:  40.0 mCi Tc-24m DTPA aerosol and 6.0 mCi Tc-75m MAA  COMPARISON:  Chest radiograph of earlier in the day.  FINDINGS: Ventilation: Heterogeneous tracer distribution. No wedge-shaped ventilation defects. Vague decreased ventilation to the lingula and left upper lobe, possibly related to airspace disease on today's chest radiograph.  Perfusion: No wedge shaped peripheral perfusion defects to suggest acute pulmonary embolism.  IMPRESSION: No evidence of pulmonary embolism.   Electronically Signed   By: Abigail Miyamoto M.D.   On: 08/20/2013 15:44    ASSESSMENT AND PLAN  1. Chest pain; somewhat atypical for angina, but atherosclerosis noted on CT. Amlodipine added.  2. Severe Pulm HTN: PA pressure 82 mm HG by recent ECHO ; right heart catheterization on 08/28/13 showed pulmonary artery pressure 73/31 and a mean pulmonary capillary wedge pressure of 24. Further diuresis/volume removal was recommended to help reduce pulmonary pressure although the pulmonary hypertension appeared to be out of proportion to the left heart failure. Her Coumadin coumadin has been restarted.  3. CKD s/p kidney transplant, StageIV .  4. HTN - controlled on current medication. 5. Remote PAF at time of kidney transplant . Currently in sinus  6. Antiphospholipid syndrome with h/o blood clot, on coumadin ,  7. Aortic  sclerosis/mild AR  8. possible pneumonia by x-ray.  White count is not elevated and she is afebrile  Plan: Repeat chest x-ray today.  Give Lasix 20 mg by mouth today.  Consider pulmonary consultation if chest x-ray fails to show improvement.   Signed, Darlin Coco MD

## 2013-08-30 NOTE — Progress Notes (Signed)
ANTICOAGULATION CONSULT NOTE - Consult  Pharmacy Consult for coumadin Indication: Antiphospholipid syndrome with h/o blood clot, afib   Allergies  Allergen Reactions  . Gentamicin Itching    unknown  . Gentamycin [Gentamicin Sulfate] Itching and Swelling  . Penicillins Itching  . Vancomycin Itching and Swelling    Patient Measurements: Height: 5\' 1"  (154.9 cm) Weight: 164 lb 14.4 oz (74.798 kg) IBW/kg (Calculated) : 47.8  Vital Signs: Temp: 98 F (36.7 C) (08/09 0500) BP: 149/66 mmHg (08/09 0500) Pulse Rate: 77 (08/09 0500)  Labs:  Recent Labs  08/28/13 1310 08/29/13 0420 08/30/13 0415  HGB 9.5*  --   --   HCT 28.1*  --   --   PLT 173  --   --   LABPROT 20.3* 21.0* 22.9*  INR 1.73* 1.81* 2.02*  CREATININE 2.63* 2.68* 2.59*    Estimated Creatinine Clearance: 20.3 ml/min (by C-G formula based on Cr of 2.59).   Medical History: Past Medical History  Diagnosis Date  . Kidney transplant as cause of abnormal reaction or later complication   . Hypertension   . Colon polyps   . Gall stones 1980  . Anti-phospholipid antibody syndrome     (Based on hospital discharge summary march 2013)  . Atrial fibrillation   . Carpal tunnel syndrome 2003    lt  . Esophageal stricture   . Hemorrhoids   . Renal disorder   . ESRD (end stage renal disease) on dialysis     "til I got my transplant in 2010"  . DVT (deep venous thrombosis) 1990    LLE  . History of blood transfusion     "related to the lupus"  . Dyslipidemia 07/22/2013  . Coronary artery disease   . Heart murmur   . Pneumonia     "3 times now" (08/25/2013)  . GERD (gastroesophageal reflux disease)   . Arthritis     "fingers" (08/25/2013)  . Glaucoma, left eye     Medications:  Prescriptions prior to admission  Medication Sig Dispense Refill  . aspirin EC 81 MG tablet Take 81 mg by mouth daily.      Marland Kitchen atorvastatin (LIPITOR) 10 MG tablet Take 1 tablet (10 mg total) by mouth daily.  30 tablet  11  .  calcitRIOL (ROCALTROL) 0.25 MCG capsule Take 0.25-0.5 mcg by mouth daily. Take 0.25 mcg daily alternating with 0.5 mcg      . carvedilol (COREG) 3.125 MG tablet Take 1 tablet (3.125 mg total) by mouth 2 (two) times daily with a meal.  60 tablet  11  . furosemide (LASIX) 20 MG tablet Take 20 mg by mouth daily as needed. Excess edema      . isosorbide dinitrate (ISORDIL) 10 MG tablet Take 1 tablet (10 mg total) by mouth 3 (three) times daily.  90 tablet  3  . LUMIGAN 0.01 % SOLN Place 1 drop into both eyes at bedtime.       . magnesium oxide (MAG-OX) 400 (241.3 MG) MG tablet Take 1 tablet (400 mg total) by mouth 2 (two) times daily.  60 tablet  3  . mycophenolate (MYFORTIC) 180 MG EC tablet Take 180 mg by mouth 2 (two) times daily.       . nitroGLYCERIN (NITROSTAT) 0.4 MG SL tablet Place 1 tablet (0.4 mg total) under the tongue every 5 (five) minutes x 3 doses as needed for chest pain.  25 tablet  12  . ranitidine (ZANTAC) 150 MG tablet Take 150 mg by  mouth daily.       . ranolazine (RANEXA) 500 MG 12 hr tablet Take 1 tablet (500 mg total) by mouth 2 (two) times daily.  60 tablet  3  . sodium bicarbonate 650 MG tablet Take 650 mg by mouth 2 (two) times daily.      . tacrolimus (PROGRAF) 1 MG capsule Take 3-4 mg by mouth See admin instructions. 4 caps in am and 3 caps in pm.      . timolol (BETIMOL) 0.5 % ophthalmic solution Place 1 drop into both eyes 2 (two) times daily.      Marland Kitchen warfarin (COUMADIN) 2.5 MG tablet Take 1.25-2.5 mg by mouth every evening. Alternating between 1.25mg  and 2.5mg  daily.      . ALPHAGAN P 0.1 % SOLN Place 1 drop into both eyes at bedtime.        Scheduled:  . amLODipine  5 mg Oral Daily  . aspirin EC  81 mg Oral Daily  . atorvastatin  10 mg Oral Daily  . calcitRIOL  0.25 mcg Oral QODAY  . calcitRIOL  0.5 mcg Oral QODAY  . carvedilol  3.125 mg Oral BID WC  . famotidine  20 mg Oral Daily  . isosorbide dinitrate  10 mg Oral TID  . magnesium oxide  400 mg Oral BID  .  mycophenolate  180 mg Oral BID  . ranolazine  500 mg Oral BID  . sodium bicarbonate  650 mg Oral BID  . sodium chloride  3 mL Intravenous Q12H  . tacrolimus  3 mg Oral QHS  . tacrolimus  4 mg Oral q morning - 10a  . timolol  1 drop Both Eyes BID  . warfarin  2.5 mg Oral ONCE-1800  . Warfarin - Pharmacist Dosing Inpatient   Does not apply q1800    Assessment: 63 yo female with antiphospholipid syndrome with h/o blood clot and history of afib now s/p RHC to restart coumadin. INR today= 2.02.  No signs of bleeding.  Home coumadin dose: alternates 1.25mg  and 2.5mg  daily  Goal of Therapy:  INR 2-3 Monitor platelets by anticoagulation protocol: Yes   Plan:  - Coumadin 2.5 mg po today - Daily PT/INR - Monitor s/sx bleeding  Hassie Bruce, Pharm. D. Clinical Pharmacy Resident Pager: [phone] Ph: [phone] 08/30/2013 8:18 AM

## 2013-08-31 DIAGNOSIS — I2789 Other specified pulmonary heart diseases: Secondary | ICD-10-CM

## 2013-08-31 DIAGNOSIS — J189 Pneumonia, unspecified organism: Secondary | ICD-10-CM

## 2013-08-31 DIAGNOSIS — T8610 Unspecified complication of kidney transplant: Secondary | ICD-10-CM

## 2013-08-31 LAB — PROTIME-INR
INR: 1.93 — ABNORMAL HIGH (ref 0.00–1.49)
Prothrombin Time: 22.1 s — ABNORMAL HIGH (ref 11.6–15.2)

## 2013-08-31 MED ORDER — WARFARIN SODIUM 2.5 MG PO TABS
2.5000 mg | ORAL_TABLET | Freq: Once | ORAL | Status: AC
  Administered 2013-08-31: 2.5 mg via ORAL
  Filled 2013-08-31: qty 1

## 2013-08-31 MED ORDER — LEVOFLOXACIN 500 MG PO TABS: 500.0000 mg | ORAL_TABLET | ORAL | Status: DC

## 2013-08-31 MED ORDER — LEVOFLOXACIN 750 MG PO TABS
750.0000 mg | ORAL_TABLET | Freq: Every day | ORAL | Status: AC
  Administered 2013-08-31: 750 mg via ORAL
  Filled 2013-08-31: qty 1

## 2013-08-31 NOTE — Progress Notes (Addendum)
Patient Name: Debbie [name] Date of Encounter: 08/31/2013     Active Problems:   Chest pain   AKI (acute kidney injury)    SUBJECTIVE  Pt is feeling a bit better but continues to wheeze.  No CP.   CURRENT MEDS . amLODipine  5 mg Oral Daily  . aspirin EC  81 mg Oral Daily  . atorvastatin  10 mg Oral Daily  . calcitRIOL  0.25 mcg Oral QODAY  . calcitRIOL  0.5 mcg Oral QODAY  . carvedilol  3.125 mg Oral BID WC  . famotidine  20 mg Oral Daily  . isosorbide dinitrate  10 mg Oral TID  . magnesium oxide  400 mg Oral BID  . mycophenolate  180 mg Oral BID  . ranolazine  500 mg Oral BID  . sodium bicarbonate  650 mg Oral BID  . sodium chloride  3 mL Intravenous Q12H  . tacrolimus  3 mg Oral QHS  . tacrolimus  4 mg Oral q morning - 10a  . timolol  1 drop Both Eyes BID  . Warfarin - Pharmacist Dosing Inpatient   Does not apply q1800    OBJECTIVE  Filed Vitals:   08/30/13 0500 08/30/13 1430 08/30/13 2100 08/31/13 0500  BP: 149/66 155/55 127/54 142/55  Pulse: 77 68 73 74  Temp: 98 F (36.7 C) 98.1 F (36.7 C) 98.4 F (36.9 C) 98.4 F (36.9 C)  TempSrc:  Oral    Resp: 16 16 16 16   Height:      Weight: 164 lb 14.4 oz (74.798 kg)   163 lb 3.2 oz (74.027 kg)  SpO2: 93% 99% 99% 96%    Intake/Output Summary (Last 24 hours) at 08/31/13 0909 Last data filed at 08/31/13 0600  Gross per 24 hour  Intake    300 ml  Output      0 ml  Net    300 ml   Filed Weights   08/29/13 0500 08/30/13 0500 08/31/13 0500  Weight: 165 lb 3.2 oz (74.934 kg) 164 lb 14.4 oz (74.798 kg) 163 lb 3.2 oz (74.027 kg)    PHYSICAL EXAM  General: Pleasant, NAD. Neuro: Alert and oriented X 3. Moves all extremities spontaneously. Psych: Normal affect. HEENT:  Normal  Neck: Supple without bruits or JVD. Lungs:  Resp regular and unlabored, slight wheezing  Heart: RRR no s3, s4, Abdomen: Soft, non-tender, non-distended, BS + x 4.  Extremities: No clubbing, cyanosis or edema. DP/PT/Radials 2+ and  equal bilaterally.  Accessory Clinical Findings  CBC  Recent Labs  08/28/13 1310  WBC 6.8  NEUTROABS 3.6  HGB 9.5*  HCT 28.1*  MCV 81.2  PLT 413   Basic Metabolic Panel  Recent Labs  08/29/13 0420 08/30/13 0415  NA 134* 136*  K 5.0 4.8  CL 100 100  CO2 18* 22  GLUCOSE 98 95  BUN 31* 28*  CREATININE 2.68* 2.59*  CALCIUM 9.6 9.6   Liver Function Tests No results found for this basename: AST, ALT, ALKPHOS, BILITOT, PROT, ALBUMIN,  in the last 72 hours No results found for this basename: LIPASE, AMYLASE,  in the last 72 hours Cardiac Enzymes No results found for this basename: CKTOTAL, CKMB, CKMBINDEX, TROPONINI,  in the last 72 hours BNP No components found with this basename: POCBNP,  D-Dimer No results found for this basename: DDIMER,  in the last 72 hours Hemoglobin A1C No results found for this basename: HGBA1C,  in the last 72 hours Fasting Lipid Panel  No results found for this basename: CHOL, HDL, LDLCALC, TRIG, CHOLHDL, LDLDIRECT,  in the last 72 hours Thyroid Function Tests No results found for this basename: TSH, T4TOTAL, FREET3, T3FREE, THYROIDAB,  in the last 72 hours  TELE  NSR,   ECG   Radiology/Studies  Dg Chest 2 View  08/30/2013   CLINICAL DATA:  63 year old female with left chest pain and cough.  EXAM: CHEST  2 VIEW  COMPARISON:  08/27/2013 and prior chest radiographs  FINDINGS: Left lower lung airspace disease is unchanged.  Left hemithorax volume loss is unchanged.  Cardiomegaly and mild pulmonary vascular congestion is present.  There is no evidence of pleural effusion or pneumothorax.  No acute bony abnormalities identified.  IMPRESSION: Unchanged left lower lung airspace disease suspicious for pneumonia. Radiographic follow-up to resolution recommended.  Cardiomegaly and mild pulmonary vascular congestion.   Electronically Signed   By: Hassan Rowan M.D.   On: 08/30/2013 14:46   Dg Chest 2 View  Ct Chest Wo Contrast  08/27/2013   CLINICAL  DATA:  Atypical chest pain.  EXAM: CT CHEST WITHOUT CONTRAST  TECHNIQUE: Multidetector CT imaging of the chest was performed following the standard protocol without IV contrast.  COMPARISON:  Chest radiographs 08/27/2013 and earlier  FINDINGS: No enlarged axillary or hilar lymph nodes are identified. There are multiple small mediastinal lymph nodes measuring up to 1.1 cm in short axis. Advanced, three-vessel coronary artery calcifications are noted. Mitral annular and mild aortic valve calcification is noted. The main pulmonary artery is enlarged, measuring 3.8 cm in diameter. There is trace pericardial fluid. Trace bilateral pleural effusions are present, right larger than left.  Patchy, predominantly peribronchovascular ground-glass opacities are present in the right upper lobe. There is evidence of left upper lobe/lingular volume loss. Confluent opacity anteriorly in the left upper lobe/ lingula extending to the pleural surface may reflect atelectasis and scarring related to prior infection/inflammation. Two adjacent pleural calcifications are noted. Evaluation of the lung bases is partially limited by motion, however there is more extensive ground-glass and patchy consolidative opacity in the left lower lobe. Mild ground-glass opacity is present in the basilar right lower lobe. A small amount of pleural fluid is present in the left major fissure.  Cholecystectomy clips are partially visualized in the upper abdomen. Extensive upper abdominal atherosclerotic vascular calcification is noted. Mild thoracic spondylosis is noted.  IMPRESSION: 1. Left lower lobe greater than right upper lobe opacities, concerning for multifocal infection. Right lower lobe ground-glass opacity may reflect atelectasis or additional infection. Radiographic follow-up recommended to ensure resolution with consideration for short-term follow-up chest CT after treatment. 2. Left upper lobe/lingular volume loss with subpleural opacity  suggestive of chronic atelectasis. 3. Trace bilateral pleural effusions. 4. Main pulmonary artery enlargement, which can be seen in the setting of pulmonary arterial hypertension. 5. Advanced atherosclerosis. 6. Small mediastinal lymph nodes, most likely reactive.   Electronically Signed   By: Logan Bores   On: 08/27/2013 11:33   Nm Pulmonary Perf And Vent  08/20/2013   CLINICAL DATA:  Shortness of breath and chest pain. History of renal transplant. Atrial fibrillation.  EXAM: NUCLEAR MEDICINE VENTILATION - PERFUSION LUNG SCAN  TECHNIQUE: Ventilation images were obtained in multiple projections using inhaled aerosol technetium 99 M DTPA. Perfusion images were obtained in multiple projections after intravenous injection of Tc-74m MAA.  RADIOPHARMACEUTICALS:  40.0 mCi Tc-77m DTPA aerosol and 6.0 mCi Tc-52m MAA  COMPARISON:  Chest radiograph of earlier in the day.  FINDINGS: Ventilation: Heterogeneous  tracer distribution. No wedge-shaped ventilation defects. Vague decreased ventilation to the lingula and left upper lobe, possibly related to airspace disease on today's chest radiograph.  Perfusion: No wedge shaped peripheral perfusion defects to suggest acute pulmonary embolism.  IMPRESSION: No evidence of pulmonary embolism.   Electronically Signed   By: Abigail Miyamoto M.D.   On: 08/20/2013 15:44    ASSESSMENT AND PLAN  1. Atypical chest pain  - unable to do LHC due to significant CKD s/p renal transplant, 20-30% chance to progress to HD per nephro consult during last admission  - atherosclerosis noted on CT   2. Severe pulm HTN  - PA pressure 82 mm HG by recent ECHO 08/07/2013  - right heart catheterization on 08/28/13 showed pulmonary artery pressure 73/31 and a mean pulmonary capillary wedge pressure of 24  - Further diuresis/volume removal was recommended to help reduce pulmonary pressure although the pulmonary hypertension appeared to be out of proportion to the left heart failure  - repeat CXR  continue to concern for LLL pneumonia, consider pulmonology consult for pulm etiology of pulm HTN  3. CKD s/p kidney transplant, StageIV  4. HTN - controlled on current medication 5. Remote PAF at time of kidney transplant . Currently in sinus  6. Antiphospholipid syndrome with h/o blood clot, on coumadin   7. Aortic sclerosis/mild AR  8. possible pneumonia by x-ray. White count is not elevated and she is afebrile  Ruben Im Pager: [phone]   Attending Note:   The patient was seen and examined.  Agree with assessment and plan as noted above.  Changes made to the above note as needed.  I question whether the  I/O are correct.  Her weight has continued to decrease despite the I/O being + for the past several days. The CXR shows a persistent LLL infiltrate.   Will get a pulmonary consult to help [place] sort that out.    Thayer Headings, Brooke Bonito., MD, [facility] 08/31/2013, 10:29 AM [address]  [place] Pager [phone]

## 2013-08-31 NOTE — Progress Notes (Signed)
ANTICOAGULATION CONSULT NOTE - Follow Up Consult  Pharmacy Consult for Coumadin Indication: Antiphospholipid syndrome with h/o blood clot, afib  Allergies  Allergen Reactions  . Gentamicin Itching    unknown  . Gentamycin [Gentamicin Sulfate] Itching and Swelling  . Penicillins Itching  . Vancomycin Itching and Swelling    Patient Measurements: Height: 5\' 1"  (154.9 cm) Weight: 163 lb 3.2 oz (74.027 kg) IBW/kg (Calculated) : 47.8  Vital Signs: Temp: 98.4 F (36.9 C) (08/10 0500) BP: 137/64 mmHg (08/10 1036) Pulse Rate: 78 (08/10 1038)  Labs:  Recent Labs  08/28/13 1310 08/29/13 0420 08/30/13 0415 08/31/13 0317  HGB 9.5*  --   --   --   HCT 28.1*  --   --   --   PLT 173  --   --   --   LABPROT 20.3* 21.0* 22.9* 22.1*  INR 1.73* 1.81* 2.02* 1.93*  CREATININE 2.63* 2.68* 2.59*  --     Estimated Creatinine Clearance: 20.2 ml/min (by C-G formula based on Cr of 2.59).  Assessment:   INR is just below target range.  2.5 mg dose given on 8/9.  Home Coumadin regimen: 2.5 mg alternating with 1.25 mg.  Long-term Coumadin use. INR low on admission. Previously on 2.5 mg daily.  Goal of Therapy:  INR 2-3 Monitor platelets by anticoagulation protocol: Yes   Plan:   Coumadin 2.5 mg again today.  Daily PT/INR for now.  Arty Baumgartner, [place] Pager: [phone] 08/31/2013,11:43 AM

## 2013-08-31 NOTE — Progress Notes (Signed)
CARE MANAGEMENT NOTE 08/31/2013  Patient:  Debbie [name], Debbie [name]   Account Number:  [id number]  Date Initiated:  08/31/2013  Documentation initiated by:  Marvetta Gibbons  Subjective/Objective Assessment:   Pt admitted with chest pain pulm. HTN     Action/Plan:   PTA pt lived at home   Anticipated DC Date:  09/01/2013   Anticipated DC Plan:  HOME/SELF CARE         Choice offered to / List presented to:             Status of service:  In process, will continue to follow Medicare Important Message given?  YES (If response is "NO", the following Medicare IM given date fields will be blank) Date Medicare IM given:  08/31/2013 Medicare IM given by:  Marvetta Gibbons Date Additional Medicare IM given:   Additional Medicare IM given by:    Discharge Disposition:    Per UR Regulation:  Reviewed for med. necessity/level of care/duration of stay  If discussed at [place] of Stay Meetings, dates discussed:   09/01/2013    Comments:

## 2013-08-31 NOTE — Consult Note (Addendum)
Name: Debbie [name] MRN: [id number] DOB: 1950-04-18    ADMISSION DATE:  08/27/2013 CONSULTATION DATE: 8/10  REFERRING MD :  Cards PRIMARY SERVICE:  Cards  CHIEF COMPLAINT: Chest pain x 1 month  BRIEF PATIENT DESCRIPTION:  63 yo AAF with CP x 1 months, Renal transplant pt with bilateral opacities on ct chest.  SIGNIFICANT EVENTS / STUDIES:  8/6 CT chest non contrasted.  LLL and rul opacity. PAH.  LINES / TUBES: Rt fa AV graft>>  CULTURES: none  ANTIBIOTICS: none  HISTORY OF PRESENT ILLNESS:  63 yo AAF who had a renal transplant 2010(base creatine 2.38) who has had a cough(non productive) x 1 month associated left chest pain that increases with excerption. She denies fever, sweats, hemoptysis, lower ext pain. She has been set up for rt heart cath in near future and is off her coumadin(afib/ Antiphospholipid atb). CT of chest reveals LLL and RUL opacities and PCCM asked to evaluate in immunocompromised renal transplant patient.    PAST MEDICAL HISTORY :  Past Medical History  Diagnosis Date  . Kidney transplant as cause of abnormal reaction or later complication   . Hypertension   . Colon polyps   . Gall stones 1980  . Anti-phospholipid antibody syndrome     (Based on hospital discharge summary march 2013)  . Atrial fibrillation   . Carpal tunnel syndrome 2003    lt  . Esophageal stricture   . Hemorrhoids   . Renal disorder   . ESRD (end stage renal disease) on dialysis     "til I got my transplant in 2010"  . DVT (deep venous thrombosis) 1990    LLE  . History of blood transfusion     "related to the lupus"  . Dyslipidemia 07/22/2013  . Coronary artery disease   . Heart murmur   . Pneumonia     "3 times now" (08/25/2013)  . GERD (gastroesophageal reflux disease)   . Arthritis     "fingers" (08/25/2013)  . Glaucoma, left eye    Past Surgical History  Procedure Laterality Date  . Nephrectomy transplanted organ  05/2008  . Cholecystectomy  1980  . Hematoma  evacuation  1998    "after they took peritoneal catheter out"  . Peritoneal catheter insertion    . Peritoneal catheter removal    . Carpal tunnel release Right 07/2001  . Arteriovenous graft placement    . Thrombectomy and revision of arterioventous (av) goretex  graft Right 07/1999; 04/2002; 09/2002; 12/2005; 09/2007;     upper arm/notes 07/31/1999; 05/18/2002; 10/07/2002; 01/14/2006; 09/14/2007;    Prior to Admission medications   Medication Sig Start Date End Date Taking? Authorizing Provider  aspirin EC 81 MG tablet Take 81 mg by mouth daily.   Yes Historical Provider, MD  atorvastatin (LIPITOR) 10 MG tablet Take 1 tablet (10 mg total) by mouth daily. 07/22/13  Yes Sueanne Margarita, MD  calcitRIOL (ROCALTROL) 0.25 MCG capsule Take 0.25-0.5 mcg by mouth daily. Take 0.25 mcg daily alternating with 0.5 mcg 06/12/13  Yes Historical Provider, MD  carvedilol (COREG) 3.125 MG tablet Take 1 tablet (3.125 mg total) by mouth 2 (two) times daily with a meal. 08/26/13  Yes Rhonda G Barrett, PA-C  furosemide (LASIX) 20 MG tablet Take 20 mg by mouth daily as needed. Excess edema   Yes Historical Provider, MD  isosorbide dinitrate (ISORDIL) 10 MG tablet Take 1 tablet (10 mg total) by mouth 3 (three) times daily. 08/26/13  Yes Evelene Croon Barrett,  PA-C  LUMIGAN 0.01 % SOLN Place 1 drop into both eyes at bedtime.  10/30/11  Yes Historical Provider, MD  magnesium oxide (MAG-OX) 400 (241.3 MG) MG tablet Take 1 tablet (400 mg total) by mouth 2 (two) times daily. 08/26/13  Yes Rhonda G Barrett, PA-C  mycophenolate (MYFORTIC) 180 MG EC tablet Take 180 mg by mouth 2 (two) times daily.    Yes Historical Provider, MD  nitroGLYCERIN (NITROSTAT) 0.4 MG SL tablet Place 1 tablet (0.4 mg total) under the tongue every 5 (five) minutes x 3 doses as needed for chest pain. 08/26/13  Yes Rhonda G Barrett, PA-C  ranitidine (ZANTAC) 150 MG tablet Take 150 mg by mouth daily.    Yes Historical Provider, MD  ranolazine (RANEXA) 500 MG 12 hr tablet Take 1  tablet (500 mg total) by mouth 2 (two) times daily. 08/26/13  Yes Rhonda G Barrett, PA-C  sodium bicarbonate 650 MG tablet Take 650 mg by mouth 2 (two) times daily.   Yes Historical Provider, MD  tacrolimus (PROGRAF) 1 MG capsule Take 3-4 mg by mouth See admin instructions. 4 caps in am and 3 caps in pm.   Yes Historical Provider, MD  timolol (BETIMOL) 0.5 % ophthalmic solution Place 1 drop into both eyes 2 (two) times daily.   Yes Historical Provider, MD  warfarin (COUMADIN) 2.5 MG tablet Take 1.25-2.5 mg by mouth every evening. Alternating between 1.25mg  and 2.5mg  daily.   Yes Historical Provider, MD  ALPHAGAN P 0.1 % SOLN Place 1 drop into both eyes at bedtime.  10/11/11   Historical Provider, MD   Allergies  Allergen Reactions  . Gentamicin Itching    unknown  . Gentamycin [Gentamicin Sulfate] Itching and Swelling  . Penicillins Itching  . Vancomycin Itching and Swelling    FAMILY HISTORY:  Family History  Problem Relation Age of Onset  . Hypertension Mother   . Heart disease Mother   . Colon cancer Neg Hx   . Rectal cancer Neg Hx   . Stomach cancer Neg Hx    SOCIAL HISTORY:  reports that she quit smoking about 36 years ago. Her smoking use included Cigarettes. She has a 30 pack-year smoking history. She has never used smokeless tobacco. She reports that she drinks alcohol. She reports that she does not use illicit drugs.  REVIEW OF SYSTEMS:   10 point review of system taken, please see HPI for positives and negatives.   SUBJECTIVE:  Alert, on room air , NAD at rest  VITAL SIGNS: Temp:  [98.1 F (36.7 C)-98.4 F (36.9 C)] 98.4 F (36.9 C) (08/10 0500) Pulse Rate:  [68-78] 78 (08/10 1038) Resp:  [16] 16 (08/10 0500) BP: (127-155)/(54-64) 137/64 mmHg (08/10 1036) SpO2:  [96 %-99 %] 96 % (08/10 0500) Weight:  [163 lb 3.2 oz (74.027 kg)] 163 lb 3.2 oz (74.027 kg) (08/10 0500)  PHYSICAL EXAMINATION: General:wnwdaaf nad Neuro:  Intact, mae x 4, speech clear HEENT:  No  JVD/LAN Cardiovascular:  HSR RRR 3/6 SEM Lungs:  CTA except mild rhonchi bilaterally  Abdomen:  Soft , non tender + bs Musculoskeletal:  Intact Skin:  Dry, warm , no LE edema, rt f a av graft +bruit and trill   Recent Labs Lab 08/28/13 1310 08/29/13 0420 08/30/13 0415  NA 134* 134* 136*  K 6.1* 5.0 4.8  CL 100 100 100  CO2 20 18* 22  BUN 30* 31* 28*  CREATININE 2.63* 2.68* 2.59*  GLUCOSE 101* 98 95    Recent  Labs Lab 08/27/13 0050 08/27/13 0710 08/28/13 1310  HGB 9.1* 9.1* 9.5*  HCT 27.1* 27.2* 28.1*  WBC 5.4 5.9 6.8  PLT 165 167 173   Dg Chest 2 View  08/30/2013   CLINICAL DATA:  63 year old female with left chest pain and cough.  EXAM: CHEST  2 VIEW  COMPARISON:  08/27/2013 and prior chest radiographs  FINDINGS: Left lower lung airspace disease is unchanged.  Left hemithorax volume loss is unchanged.  Cardiomegaly and mild pulmonary vascular congestion is present.  There is no evidence of pleural effusion or pneumothorax.  No acute bony abnormalities identified.  IMPRESSION: Unchanged left lower lung airspace disease suspicious for pneumonia. Radiographic follow-up to resolution recommended.  Cardiomegaly and mild pulmonary vascular congestion.   Electronically Signed   By: Hassan Rowan M.D.   On: 08/30/2013 14:46   CT scan s noted 8/6 ASSESSMENT / PLAN:  63 yo AAF 5 years post renal transplant with chronic poorly controlled Htn, PHTN by CT scan(rt atrium dilatation by 2 d 7/15), chronic chest pain and new CT finding of bilateral opacities. ? If infectious process(doubt) with normal white count , no fevers , no sputum production. MD to evaluate.   Richardson Landry Minor ACNP [facility] PCC[facility] Pager [phone] till 3 pm If no answer page [phone] 08/31/2013, 11:28 AM   Pulmonary and [place] Pager: [phone]  08/31/2013, 11:09 AM  Attending:  I have seen and examined the patient with nurse practitioner/resident and agree with the note above.    Impression: 1) HCAP 2) Renal transplant 3) Pulmonary hypertension, WHO class II 4)  Diastolic dysfunction  Interesting case.  Ddx of the infiltrates includes infectious vs inflammatory process.  Anti-cardiolipin syndrome/Lupus can present with infiltrates.  Agree that with lack of fever, chills and leukocytosis infectious complications seem less likely, but with her immunosuppression would still cover for an infectious cause.  In the meantime will collect some rudimentary lupus serology studies.  Will see her back in 2 weeks in clinic with a CXR.  If improved overall and infiltrates have improved, will call this infectious and be done with it.  However, if she has not improved may need to consider a bronchoscopy.  The trouble is her pulmonary hypertension as this will not allow a biopsy.   As for her pulmonary hypertension, I agree that this seems out of proportion to her elevated wedge pressure, but before considering treatment we would need to see her PA/RV numbers with a normal wedge.  Plan: Levaquin for 7 days tussionex prn cough F/u [facility] in 2 weeks, [facility] / [facility] with CXR F/u ANA, ds-DNA   Roselie Awkward, MD [place] [facility] Pager: [phone] Cell: [phone] If no response, call [phone]

## 2013-09-01 ENCOUNTER — Telehealth: Payer: Self-pay | Admitting: *Deleted

## 2013-09-01 LAB — CARDIOLIPIN ANTIBODIES, IGG, IGM, IGA
Anticardiolipin IgA: 5 APL U/mL — ABNORMAL LOW (ref <22)
Anticardiolipin IgG: 1 GPL U/mL — ABNORMAL LOW (ref <23)
Anticardiolipin IgM: 0 MPL U/mL — ABNORMAL LOW (ref <11)

## 2013-09-01 LAB — PROTIME-INR
INR: 1.99 — AB (ref 0.00–1.49)
Prothrombin Time: 22.6 seconds — ABNORMAL HIGH (ref 11.6–15.2)

## 2013-09-01 LAB — ANA: ANA: NEGATIVE

## 2013-09-01 LAB — ANTI-DNA ANTIBODY, DOUBLE-STRANDED

## 2013-09-01 MED ORDER — LEVOFLOXACIN 500 MG PO TABS: 500.0000 mg | ORAL_TABLET | ORAL | Status: DC

## 2013-09-01 MED ORDER — AMLODIPINE BESYLATE 5 MG PO TABS: 5.0000 mg | ORAL_TABLET | Freq: Every day | ORAL | Status: DC

## 2013-09-01 NOTE — Discharge Instructions (Signed)
Please have your Coumadin checked in 3 days as you are now taking an antibiotic which can alter your level.       Hypertension Hypertension, commonly called high blood pressure, is when the force of blood pumping through your arteries is too strong. Your arteries are the blood vessels that carry blood from your heart throughout your body. A blood pressure reading consists of a higher number over a lower number, such as 110/72. The higher number (systolic) is the pressure inside your arteries when your heart pumps. The lower number (diastolic) is the pressure inside your arteries when your heart relaxes. Ideally you want your blood pressure below 120/80. Hypertension forces your heart to work harder to pump blood. Your arteries may become narrow or stiff. Having hypertension puts you at risk for heart disease, stroke, and other problems.  RISK FACTORS Some risk factors for high blood pressure are controllable. Others are not.  Risk factors you cannot control include:   Race. You may be at higher risk if you are African American.  Age. Risk increases with age.  Gender. Men are at higher risk than women before age 62 years. After age 61, women are at higher risk than men. Risk factors you can control include:  Not getting enough exercise or physical activity.  Being overweight.  Getting too much fat, sugar, calories, or salt in your diet.  Drinking too much alcohol. SIGNS AND SYMPTOMS Hypertension does not usually cause signs or symptoms. Extremely high blood pressure (hypertensive crisis) may cause headache, anxiety, shortness of breath, and nosebleed. DIAGNOSIS  To check if you have hypertension, your health care provider will measure your blood pressure while you are seated, with your arm held at the level of your heart. It should be measured at least twice using the same arm. Certain conditions can cause a difference in blood pressure between your right and left arms. A blood  pressure reading that is higher than normal on one occasion does not mean that you need treatment. If one blood pressure reading is high, ask your health care provider about having it checked again. TREATMENT  Treating high blood pressure includes making lifestyle changes and possibly taking medicine. Living a healthy lifestyle can help lower high blood pressure. You may need to change some of your habits. Lifestyle changes may include:  Following the DASH diet. This diet is high in fruits, vegetables, and whole grains. It is low in salt, red meat, and added sugars.  Getting at least 2 hours of brisk physical activity every week.  Losing weight if necessary.  Not smoking.  Limiting alcoholic beverages.  Learning ways to reduce stress. If lifestyle changes are not enough to get your blood pressure under control, your health care provider may prescribe medicine. You may need to take more than one. Work closely with your health care provider to understand the risks and benefits. HOME CARE INSTRUCTIONS  Have your blood pressure rechecked as directed by your health care provider.   Take medicines only as directed by your health care provider. Follow the directions carefully. Blood pressure medicines must be taken as prescribed. The medicine does not work as well when you skip doses. Skipping doses also puts you at risk for problems.   Do not smoke.   Monitor your blood pressure at home as directed by your health care provider. SEEK MEDICAL CARE IF:   You think you are having a reaction to medicines taken.  You have recurrent headaches or feel dizzy.  You have swelling in your ankles.  You have trouble with your vision. SEEK IMMEDIATE MEDICAL CARE IF:  You develop a severe headache or confusion.  You have unusual weakness, numbness, or feel faint.  You have severe chest or abdominal pain.  You vomit repeatedly.  You have trouble breathing. MAKE SURE YOU:   Understand  these instructions.  Will watch your condition.  Will get help right away if you are not doing well or get worse. Document Released: 01/08/2005 Document Revised: 05/25/2013 Document Reviewed: 10/31/2012 [facility] Patient Information 2015 [place], [place]. This information is not intended to replace advice given to you by your health care provider. Make sure you discuss any questions you have with your health care provider. Chest Pain (Nonspecific) It is often hard to give a specific diagnosis for the cause of chest pain. There is always a chance that your pain could be related to something serious, such as a heart attack or a blood clot in the lungs. You need to follow up with your health care provider for further evaluation. CAUSES   Heartburn.  Pneumonia or bronchitis.  Anxiety or stress.  Inflammation around your heart (pericarditis) or lung (pleuritis or pleurisy).  A blood clot in the lung.  A collapsed lung (pneumothorax). It can develop suddenly on its own (spontaneous pneumothorax) or from trauma to the chest.  Shingles infection (herpes zoster virus). The chest wall is composed of bones, muscles, and cartilage. Any of these can be the source of the pain.  The bones can be bruised by injury.  The muscles or cartilage can be strained by coughing or overwork.  The cartilage can be affected by inflammation and become sore (costochondritis). DIAGNOSIS  Lab tests or other studies may be needed to find the cause of your pain. Your health care provider may have you take a test called an ambulatory electrocardiogram (ECG). An ECG records your heartbeat patterns over a 24-hour period. You may also have other tests, such as:  Transthoracic echocardiogram (TTE). During echocardiography, sound waves are used to evaluate how blood flows through your heart.  Transesophageal echocardiogram (TEE).  Cardiac monitoring. This allows your health care provider to monitor your heart rate and  rhythm in real time.  Holter monitor. This is a portable device that records your heartbeat and can help diagnose heart arrhythmias. It allows your health care provider to track your heart activity for several days, if needed.  Stress tests by exercise or by giving medicine that makes the heart beat faster. TREATMENT   Treatment depends on what may be causing your chest pain. Treatment may include:  Acid blockers for heartburn.  Anti-inflammatory medicine.  Pain medicine for inflammatory conditions.  Antibiotics if an infection is present.  You may be advised to change lifestyle habits. This includes stopping smoking and avoiding alcohol, caffeine, and chocolate.  You may be advised to keep your head raised (elevated) when sleeping. This reduces the chance of acid going backward from your stomach into your esophagus. Most of the time, nonspecific chest pain will improve within 2-3 days with rest and mild pain medicine.  HOME CARE INSTRUCTIONS   If antibiotics were prescribed, take them as directed. Finish them even if you start to feel better.  For the next few days, avoid physical activities that bring on chest pain. Continue physical activities as directed.  Do not use any tobacco products, including cigarettes, chewing tobacco, or electronic cigarettes.  Avoid drinking alcohol.  Only take medicine as directed by your  health care provider.  Follow your health care provider's suggestions for further testing if your chest pain does not go away.  Keep any follow-up appointments you made. If you do not go to an appointment, you could develop lasting (chronic) problems with pain. If there is any problem keeping an appointment, call to reschedule. SEEK MEDICAL CARE IF:   Your chest pain does not go away, even after treatment.  You have a rash with blisters on your chest.  You have a fever. SEEK IMMEDIATE MEDICAL CARE IF:   You have increased chest pain or pain that spreads to  your arm, neck, jaw, back, or abdomen.  You have shortness of breath.  You have an increasing cough, or you cough up blood.  You have severe back or abdominal pain.  You feel nauseous or vomit.  You have severe weakness.  You faint.  You have chills. This is an emergency. Do not wait to see if the pain will go away. Get medical help at once. Call your local emergency services (911 in [place] Do not drive yourself to the hospital. MAKE SURE YOU:   Understand these instructions.  Will watch your condition.  Will get help right away if you are not doing well or get worse. Document Released: 10/18/2004 Document Revised: 01/13/2013 Document Reviewed: 08/14/2007 [facility] Patient Information 2015 [place], [place]. This information is not intended to replace advice given to you by your health care provider. Make sure you discuss any questions you have with your health care provider. Pneumonia Pneumonia is an infection of the lungs.  CAUSES Pneumonia may be caused by bacteria or a virus. Usually, these infections are caused by breathing infectious particles into the lungs (respiratory tract). SIGNS AND SYMPTOMS   Cough.  Fever.  Chest pain.  Increased rate of breathing.  Wheezing.  Mucus production. DIAGNOSIS  If you have the common symptoms of pneumonia, your health care provider will typically confirm the diagnosis with a chest X-ray. The X-ray will show an abnormality in the lung (pulmonary infiltrate) if you have pneumonia. Other tests of your blood, urine, or sputum may be done to find the specific cause of your pneumonia. Your health care provider may also do tests (blood gases or pulse oximetry) to see how well your lungs are working. TREATMENT  Some forms of pneumonia may be spread to other people when you cough or sneeze. You may be asked to wear a mask before and during your exam. Pneumonia that is caused by bacteria is treated with antibiotic medicine. Pneumonia that is  caused by the influenza virus may be treated with an antiviral medicine. Most other viral infections must run their course. These infections will not respond to antibiotics.  HOME CARE INSTRUCTIONS   Cough suppressants may be used if you are losing too much rest. However, coughing protects you by clearing your lungs. You should avoid using cough suppressants if you can.  Your health care provider may have prescribed medicine if he or she thinks your pneumonia is caused by bacteria or influenza. Finish your medicine even if you start to feel better.  Your health care provider may also prescribe an expectorant. This loosens the mucus to be coughed up.  Take medicines only as directed by your health care provider.  Do not smoke. Smoking is a common cause of bronchitis and can contribute to pneumonia. If you are a smoker and continue to smoke, your cough may last several weeks after your pneumonia has cleared.  A cold  steam vaporizer or humidifier in your room or home may help loosen mucus.  Coughing is often worse at night. Sleeping in a semi-upright position in a recliner or using a couple pillows under your head will help with this.  Get rest as you feel it is needed. Your body will usually let you know when you need to rest. PREVENTION A pneumococcal shot (vaccine) is available to prevent a common bacterial cause of pneumonia. This is usually suggested for:  People over 66 years old.  Patients on chemotherapy.  People with chronic lung problems, such as bronchitis or emphysema.  People with immune system problems. If you are over 65 or have a high risk condition, you may receive the pneumococcal vaccine if you have not received it before. In some countries, a routine influenza vaccine is also recommended. This vaccine can help prevent some cases of pneumonia.You may be offered the influenza vaccine as part of your care. If you smoke, it is time to quit. You may receive instructions on  how to stop smoking. Your health care provider can provide medicines and counseling to help you quit. SEEK MEDICAL CARE IF: You have a fever. SEEK IMMEDIATE MEDICAL CARE IF:   Your illness becomes worse. This is especially true if you are elderly or weakened from any other disease.  You cannot control your cough with suppressants and are losing sleep.  You begin coughing up blood.  You develop pain which is getting worse or is uncontrolled with medicines.  Any of the symptoms which initially brought you in for treatment are getting worse rather than better.  You develop shortness of breath or chest pain. MAKE SURE YOU:   Understand these instructions.  Will watch your condition.  Will get help right away if you are not doing well or get worse. Document Released: 01/08/2005 Document Revised: 05/25/2013 Document Reviewed: 03/30/2010 [facility] Patient Information 2015 [place], [place]. This information is not intended to replace advice given to you by your health care provider. Make sure you discuss any questions you have with your health care provider.

## 2013-09-01 NOTE — Progress Notes (Signed)
Name: Debbie [name] MRN: [id number] DOB: [birth year]    ADMISSION DATE:  08/27/2013 CONSULTATION DATE: 8/10  REFERRING MD :  Cards PRIMARY SERVICE:  Cards  CHIEF COMPLAINT: Chest pain x 1 month  BRIEF PATIENT DESCRIPTION:  64 yo AAF with CP x 1 months, Renal transplant pt with bilateral opacities on ct chest.  SIGNIFICANT EVENTS / STUDIES:  8/6 CT chest non contrasted.  LLL and rul opacity. PAH.  LINES / TUBES: Rt fa AV graft>>  CULTURES: none  ANTIBIOTICS: Levofloxacin 8/10>>>  SUBJECTIVE:  No complaints  VITAL SIGNS: Temp:  [97.8 F (36.6 C)-98.6 F (37 C)] 98.6 F (37 C) (08/11 0543) Pulse Rate:  [72-78] 73 (08/11 0543) Resp:  [16-18] 18 (08/11 0543) BP: (122-137)/(46-64) 122/46 mmHg (08/11 0543) SpO2:  [97 %-100 %] 98 % (08/11 0543) Weight:  [163 lb 0.1 oz (73.94 kg)] 163 lb 0.1 oz (73.94 kg) (08/11 0543)  PHYSICAL EXAMINATION: General:wnwdaaf nad Neuro:  Intact, mae x 4, speech clear HEENT:  No JVD/LAN Cardiovascular:  HSR RRR 3/6 SEM Lungs:  CTA except mild rhonchi bilaterally  Abdomen:  Soft , non tender + bs Musculoskeletal:  Intact Skin:  Dry, warm , no LE edema, rt f a av graft +bruit and trill  Recent Labs Lab 08/28/13 1310 08/29/13 0420 08/30/13 0415  NA 134* 134* 136*  K 6.1* 5.0 4.8  CL 100 100 100  CO2 20 18* 22  BUN 30* 31* 28*  CREATININE 2.63* 2.68* 2.59*  GLUCOSE 101* 98 95    Recent Labs Lab 08/27/13 0050 08/27/13 0710 08/28/13 1310  HGB 9.1* 9.1* 9.5*  HCT 27.1* 27.2* 28.1*  WBC 5.4 5.9 6.8  PLT 165 167 173   Dg Chest 2 View  08/30/2013   CLINICAL DATA:  63 year old female with left chest pain and cough.  EXAM: CHEST  2 VIEW  COMPARISON:  08/27/2013 and prior chest radiographs  FINDINGS: Left lower lung airspace disease is unchanged.  Left hemithorax volume loss is unchanged.  Cardiomegaly and mild pulmonary vascular congestion is present.  There is no evidence of pleural effusion or pneumothorax.  No acute bony  abnormalities identified.  IMPRESSION: Unchanged left lower lung airspace disease suspicious for pneumonia. Radiographic follow-up to resolution recommended.  Cardiomegaly and mild pulmonary vascular congestion.   Electronically Signed   By: Hassan Rowan M.D.   On: 08/30/2013 14:46   CT scan s noted 8/6 ASSESSMENT / PLAN:  Impression: 1) HCAP ? On Levaquin now 2) Renal transplant 3) Pulmonary hypertension, WHO class II 4)  Diastolic dysfunction  Infiltrate I suspect are due to infection versus pulmonary edema, specially in patient with pulmonary HTN and patient remains completely asymptomatic.  Previous work up for auto-immune disorders have been negative.  I do not suspect that this is an auto-immune issue.  Being immune compromised however is concerning for atypical infections.  I would not bronch at this time as patient is completely asymptomatic.  As for her pulmonary hypertension, I agree that this seems out of proportion to her elevated wedge pressure, but before considering treatment we would need to see her PA/RV numbers with a normal wedge.  Plan: - Levaquin for 7 days to treat atypical infections. - F/u [facility] in 2 weeks, [facility] / [facility]rett with CXR to evaluate infiltrate, if still present will consider a bronchoscopy, if not then will dismiss this entire process. - F/u ANA, ds-DNA, pending but negative in 2013.  Rush Farmer, M.D. [facility] Pulmonary/Critical Care Medicine.  Pager: [phone]. After hours pager: [phone].

## 2013-09-01 NOTE — Progress Notes (Signed)
Patient ID: Debbie [name], female   DOB: April 27, 1950, 63 y.o.   MRN: [id number]     Patient: Debbie [name] / Admit Date: 08/27/2013 / Date of Encounter: 09/01/2013, 7:47 AM   Subjective: Doing well. No chest pain, SOB, palpitations. Started levaquin yesterday.    Objective: Telemetry: NSR, HR 70s.  Physical Exam: Blood pressure 122/46, pulse 73, temperature 98.6 F (37 C), temperature source Oral, resp. rate 18, height 5\' 1"  (1.549 m), weight 163 lb 0.1 oz (73.94 kg), SpO2 98.00%. General: Well developed, well nourished, in no acute distress. Head: Normocephalic, atraumatic, sclera non-icteric, no xanthomas, nares are without discharge. Neck: Negative for carotid bruits. JVP not elevated. Lungs: Clear bilaterally to auscultation without rales, or rhonchi. Breathing is unlabored. Decreased wheezing bilaterally.  Heart: RRR S1 S2 without rubs, or gallops. 2/6 systolic murmur.  Abdomen: Soft, non-tender, non-distended with normoactive bowel sounds. No rebound/guarding. Extremities: No clubbing or cyanosis. No edema. Distal pedal pulses are 2+ and equal bilaterally. Neuro: Alert and oriented X 3. Moves all extremities spontaneously. Psych:  Responds to questions appropriately with a normal affect.  No intake or output data in the 24 hours ending 09/01/13 0747  Inpatient Medications:  . amLODipine  5 mg Oral Daily  . aspirin EC  81 mg Oral Daily  . atorvastatin  10 mg Oral Daily  . calcitRIOL  0.25 mcg Oral QODAY  . calcitRIOL  0.5 mcg Oral QODAY  . carvedilol  3.125 mg Oral BID WC  . famotidine  20 mg Oral Daily  . isosorbide dinitrate  10 mg Oral TID  . [START ON 09/02/2013] levofloxacin  500 mg Oral Q48H  . magnesium oxide  400 mg Oral BID  . mycophenolate  180 mg Oral BID  . ranolazine  500 mg Oral BID  . sodium bicarbonate  650 mg Oral BID  . sodium chloride  3 mL Intravenous Q12H  . tacrolimus  3 mg Oral QHS  . tacrolimus  4 mg Oral q morning - 10a  . timolol  1 drop Both Eyes  BID  . Warfarin - Pharmacist Dosing Inpatient   Does not apply q1800   Infusions:    Labs:  Recent Labs  08/30/13 0415  NA 136*  K 4.8  CL 100  CO2 22  GLUCOSE 95  BUN 28*  CREATININE 2.59*  CALCIUM 9.6   No results found for this basename: AST, ALT, ALKPHOS, BILITOT, PROT, ALBUMIN,  in the last 72 hours No results found for this basename: WBC, NEUTROABS, HGB, HCT, MCV, PLT,  in the last 72 hours No results found for this basename: CKTOTAL, CKMB, TROPONINI,  in the last 72 hours No components found with this basename: POCBNP,  No results found for this basename: HGBA1C,  in the last 72 hours   Radiology/Studies:  Dg Chest 2 View  08/30/2013   CLINICAL DATA:  63 year old female with left chest pain and cough.  EXAM: CHEST  2 VIEW  COMPARISON:  08/27/2013 and prior chest radiographs  FINDINGS: Left lower lung airspace disease is unchanged.  Left hemithorax volume loss is unchanged.  Cardiomegaly and mild pulmonary vascular congestion is present.  There is no evidence of pleural effusion or pneumothorax.  No acute bony abnormalities identified.  IMPRESSION: Unchanged left lower lung airspace disease suspicious for pneumonia. Radiographic follow-up to resolution recommended.  Cardiomegaly and mild pulmonary vascular congestion.   Electronically Signed   By: Hassan Rowan M.D.   On: 08/30/2013 14:46   Dg  Chest 2 View  08/27/2013   CLINICAL DATA:  Recurrent chest pain, getting worse.  EXAM: CHEST  2 VIEW  COMPARISON:  Chest radiograph August 24, 2013  FINDINGS: Worsening left mid and lower lung zone alveolar airspace opacities. Small left pleural effusion. Cardiac silhouette appears mildly enlarged, mediastinal silhouette is nonsuspicious. Central pulmonary vascular congestion persists. No pneumothorax.  Vascular clips and stents in the bilateral arms. Mild degenerative change of the thoracic spine. Surgical clips in the included right abdomen likely reflect cholecystectomy.  IMPRESSION:  Cardiomegaly, central pulmonary vascular congestion.  Worsening right mid and lower lung zone airspace opacities may reflect confluent edema or possibly pneumonia, with small left pleural effusion. Recommend followup chest radiograph after treatment to verify improvement.   Electronically Signed   By: Elon Alas   On: 08/27/2013 03:23   Dg Chest 2 View  08/24/2013   CLINICAL DATA:  Chest pain  EXAM: CHEST  2 VIEW  COMPARISON:  08/20/2013  FINDINGS: Heart is borderline in size. Continued left lower lobe airspace opacity concerning for pneumonia. Small left pleural effusion. Increasing patchy airspace opacity on the right.  No acute bony abnormality.  IMPRESSION: Stable patchy left lower lobe airspace disease with small left effusion. Increasing patchy right basilar airspace opacity. Findings concerning for multifocal pneumonia.   Electronically Signed   By: Rolm Baptise M.D.   On: 08/24/2013 10:27   Dg Chest 2 View  08/20/2013   CLINICAL DATA:  Pulmonary embolism.  EXAM: CHEST  2 VIEW  COMPARISON:  06/30/2013.  FINDINGS: The cardiopericardial silhouette is mildly enlarged for projection. There is airspace disease in the LEFT lower lobe obscuring portions of the LEFT heart border. Small LEFT pleural effusion is present. LEFT basilar atelectasis.  Pulmonary vascular congestion is present. Scarring is present in the LEFT mid lung.  Chronic fullness of the pulmonary hila. LEFT pleural effusion appears larger than on prior examinations.  IMPRESSION: 1. LEFT lower lobe airspace disease favored to represent pneumonia or aspiration rather than asymmetric pulmonary edema. Followup in 4-6 weeks to ensure radiographic clearing and exclude an underlying lesion is recommended. 2. Mild cardiomegaly. 3. Pulmonary vascular congestion.   Electronically Signed   By: Dereck Ligas M.D.   On: 08/20/2013 15:12   Ct Chest Wo Contrast  08/27/2013   CLINICAL DATA:  Atypical chest pain.  EXAM: CT CHEST WITHOUT CONTRAST   TECHNIQUE: Multidetector CT imaging of the chest was performed following the standard protocol without IV contrast.  COMPARISON:  Chest radiographs 08/27/2013 and earlier  FINDINGS: No enlarged axillary or hilar lymph nodes are identified. There are multiple small mediastinal lymph nodes measuring up to 1.1 cm in short axis. Advanced, three-vessel coronary artery calcifications are noted. Mitral annular and mild aortic valve calcification is noted. The main pulmonary artery is enlarged, measuring 3.8 cm in diameter. There is trace pericardial fluid. Trace bilateral pleural effusions are present, right larger than left.  Patchy, predominantly peribronchovascular ground-glass opacities are present in the right upper lobe. There is evidence of left upper lobe/lingular volume loss. Confluent opacity anteriorly in the left upper lobe/ lingula extending to the pleural surface may reflect atelectasis and scarring related to prior infection/inflammation. Two adjacent pleural calcifications are noted. Evaluation of the lung bases is partially limited by motion, however there is more extensive ground-glass and patchy consolidative opacity in the left lower lobe. Mild ground-glass opacity is present in the basilar right lower lobe. A small amount of pleural fluid is present in the left major  fissure.  Cholecystectomy clips are partially visualized in the upper abdomen. Extensive upper abdominal atherosclerotic vascular calcification is noted. Mild thoracic spondylosis is noted.  IMPRESSION: 1. Left lower lobe greater than right upper lobe opacities, concerning for multifocal infection. Right lower lobe ground-glass opacity may reflect atelectasis or additional infection. Radiographic follow-up recommended to ensure resolution with consideration for short-term follow-up chest CT after treatment. 2. Left upper lobe/lingular volume loss with subpleural opacity suggestive of chronic atelectasis. 3. Trace bilateral pleural  effusions. 4. Main pulmonary artery enlargement, which can be seen in the setting of pulmonary arterial hypertension. 5. Advanced atherosclerosis. 6. Small mediastinal lymph nodes, most likely reactive.   Electronically Signed   By: Logan Bores   On: 08/27/2013 11:33   Nm Pulmonary Perf And Vent  08/20/2013   CLINICAL DATA:  Shortness of breath and chest pain. History of renal transplant. Atrial fibrillation.  EXAM: NUCLEAR MEDICINE VENTILATION - PERFUSION LUNG SCAN  TECHNIQUE: Ventilation images were obtained in multiple projections using inhaled aerosol technetium 99 M DTPA. Perfusion images were obtained in multiple projections after intravenous injection of Tc-40m MAA.  RADIOPHARMACEUTICALS:  40.0 mCi Tc-69m DTPA aerosol and 6.0 mCi Tc-21m MAA  COMPARISON:  Chest radiograph of earlier in the day.  FINDINGS: Ventilation: Heterogeneous tracer distribution. No wedge-shaped ventilation defects. Vague decreased ventilation to the lingula and left upper lobe, possibly related to airspace disease on today's chest radiograph.  Perfusion: No wedge shaped peripheral perfusion defects to suggest acute pulmonary embolism.  IMPRESSION: No evidence of pulmonary embolism.   Electronically Signed   By: Abigail Miyamoto M.D.   On: 08/20/2013 15:44     Assessment and Plan   Problem list:  1. Atypical chest pain 2. Severe pulmonary HTN 3. CKD s/p renal transplant 4. HTN 5. Remote PAF (at time of renal transplant) currently in NSR 6. Antiphospholipid syndrome with h/o blood clot, on coumadin 7. Aortic sclerosis/mild AR 8. Possible pneumonia vs inflammatory process  63 y/o F with atypical chest pain, severe pulmonary HTN, CKD stage IV s/p renal transplant, HTN, and possible pneumonia vs inflammatory process.   1. Atypical chest pain -Unable to perform LHC 2/2 to significant CKD s/p renal transplant. Per prior consult 20-30% chance of progression of HD with LHC.  -No further pain.   2. Severe pulmonary HTN - PA  pressure 82 mm HG by recent ECHO 08/07/2013  - right heart catheterization on 08/28/13 showed pulmonary artery pressure 73/31 and a mean pulmonary capillary wedge pressure of 24  - Further diuresis/volume removal was recommended to help reduce pulmonary pressure although the pulmonary hypertension appeared to be out of proportion to the left heart failure -I/O not completed past 0700 on 08/31/2013. Will have to check on this.    3. Possible pneumonia  -Seen by [facility] 08/31/2013.  -Ddx includes infectious vs inflammatory process.  -She will be covered with leaquin for infectious process and workup for anti-cardiolipin syndrome/lupus will be completed.  -Repeat CXR 2 weeks as outpatient with pulmonology.   4. HTN -Controlled -Continue current medications  5. Dispo: Will be going home today.    Signed, Christell Faith, PA-C 09/01/2013 8:16 AM   Attending Note:   The patient was seen and examined.  Agree with assessment and plan as noted above.  Changes made to the above note as needed.  Pt is stable for DC   Thayer Headings, Brooke Bonito., MD, [facility] 09/01/2013, 2:51 PM [address]  [place] Pager [phone]    

## 2013-09-01 NOTE — Telephone Encounter (Signed)
Pt still in hospital. Will call pt later today.

## 2013-09-01 NOTE — Discharge Summary (Signed)
Discharge Summary   Patient ID: Debbie [name] MRN: [id number], DOB/AGE: October 21, 1950 63 y.o. Admit date: 08/27/2013 D/C date:     09/01/2013  Primary Care Provider: Placido Sou, MD Primary Cardiologist: Dr. Radford Pax, MD   Primary Discharge Diagnoses:  Atypical chest pain Severe pulmonary HTN HCAP vs inflammatory process HTN CKD stage IV s/p renal transplant  Secondary Discharge Diagnoses:    Hospital Course: Debbie [name] is a 63 y.o. 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  She was recently admitted to [facility] (6/9-6/15) for chest pain and ruled out for MI by enzymes. She underwent nuclear stress test which showed evidence of subendocardial scar and possible soft tissue attenuation in the inferior/inferolateral distal anterior and apical walls. with mild distal anterior/apical ischemia. LVEF 54%. Since this was a low risk study and given patient's history of kidney transplant and CRI medical therapy was recommended. She was seen in the office by Dr. Heron Nay earlier this month and was doing well at that time. She has had about 3 epsiodes of chest pain since getting out of [facility] and only lasted about 5 minutes.   Patient reports around 4am having a sudden onset of chest pain in her left chest that radiated to her jaw. The pain is described as a pressure and squeezing and severe and worse with exertion. She reported mild SOB. The pain lasted about 10 minutes before resolving. The pain returned twice more with the same characteristics and completely resolved after taking nitro. Patient was diagnosed with pneumonia 4 days ago. She reports associated dyspnea on exertion. No other associated symptoms. Patient called EMS.  Dobutamine echo was cancelled  given elevated pulmonary pressure and she was medically managed with isordil 10 mg tid, coreg 3.125 mg bid, and lasix 20 mg prn. On 08/28/2013 she underwent RHC that demonstrated severe pulmonary HTN with capillary wedge mean pressure of 24 mm Hg (73/31.) It was recommended that she continue further diuresis/volume removal to help reduce pulmonary pressure, although it appeared to be out of proportion to the left heart failure. Of note, echo on 08/07/2013 demonstrated PA pressure of 82 mm Hg.   CXR revealed persistent left lower lung airspace disease suspicious for PNA. She was evaluated by PCCM who questions PNA vs inflammatory disease. She was placed on levaquin 500 mg bid x 7 days and workup for anticardiolipin syndrome/lupus was started. She will have f/u with them for repeat CXR in 2 weeks. She was discharged feeling better and in stable condition.      Discharge Vitals: Blood pressure 122/46, pulse 73, temperature 98.6 F (37 C), temperature source Oral, resp. rate 18, height 5\' 1"  (1.549 m), weight 163 lb 0.1 oz (73.94 kg), SpO2 98.00%.  Labs: Lab Results  Component Value Date   WBC 6.8 08/28/2013   HGB 9.5* 08/28/2013   HCT 28.1* 08/28/2013   MCV 81.2 08/28/2013   PLT 173 08/28/2013    Recent Labs Lab 08/30/13 0415  NA 136*  K 4.8  CL 100  CO2 22  BUN 28*  CREATININE 2.59*  CALCIUM 9.6  GLUCOSE 95   No results found for this basename: CKTOTAL, CKMB, TROPONINI,  in the last 72 hours Lab Results  Component Value Date   CHOL 146 08/25/2013  HDL 46 08/25/2013   LDLCALC 81 08/25/2013   TRIG 97 08/25/2013   Lab Results  Component Value Date   DDIMER 0.40 08/24/2013    Diagnostic Studies/Procedures   Dg Chest 2 View  08/30/2013   CLINICAL DATA:  63 year old female with left chest pain and cough.  EXAM: CHEST  2 VIEW  COMPARISON:  08/27/2013 and prior chest radiographs  FINDINGS: Left lower lung airspace disease is unchanged.  Left hemithorax volume loss is unchanged.  Cardiomegaly and mild  pulmonary vascular congestion is present.  There is no evidence of pleural effusion or pneumothorax.  No acute bony abnormalities identified.  IMPRESSION: Unchanged left lower lung airspace disease suspicious for pneumonia. Radiographic follow-up to resolution recommended.  Cardiomegaly and mild pulmonary vascular congestion.   Electronically Signed   By: Hassan Rowan M.D.   On: 08/30/2013 14:46   Dg Chest 2 View  08/27/2013   CLINICAL DATA:  Recurrent chest pain, getting worse.  EXAM: CHEST  2 VIEW  COMPARISON:  Chest radiograph August 24, 2013  FINDINGS: Worsening left mid and lower lung zone alveolar airspace opacities. Small left pleural effusion. Cardiac silhouette appears mildly enlarged, mediastinal silhouette is nonsuspicious. Central pulmonary vascular congestion persists. No pneumothorax.  Vascular clips and stents in the bilateral arms. Mild degenerative change of the thoracic spine. Surgical clips in the included right abdomen likely reflect cholecystectomy.  IMPRESSION: Cardiomegaly, central pulmonary vascular congestion.  Worsening right mid and lower lung zone airspace opacities may reflect confluent edema or possibly pneumonia, with small left pleural effusion. Recommend followup chest radiograph after treatment to verify improvement.   Electronically Signed   By: Elon Alas   On: 08/27/2013 03:23   Dg Chest 2 View  08/24/2013   CLINICAL DATA:  Chest pain  EXAM: CHEST  2 VIEW  COMPARISON:  08/20/2013  FINDINGS: Heart is borderline in size. Continued left lower lobe airspace opacity concerning for pneumonia. Small left pleural effusion. Increasing patchy airspace opacity on the right.  No acute bony abnormality.  IMPRESSION: Stable patchy left lower lobe airspace disease with small left effusion. Increasing patchy right basilar airspace opacity. Findings concerning for multifocal pneumonia.   Electronically Signed   By: Rolm Baptise M.D.   On: 08/24/2013 10:27   Dg Chest 2 View  08/20/2013    CLINICAL DATA:  Pulmonary embolism.  EXAM: CHEST  2 VIEW  COMPARISON:  06/30/2013.  FINDINGS: The cardiopericardial silhouette is mildly enlarged for projection. There is airspace disease in the LEFT lower lobe obscuring portions of the LEFT heart border. Small LEFT pleural effusion is present. LEFT basilar atelectasis.  Pulmonary vascular congestion is present. Scarring is present in the LEFT mid lung.  Chronic fullness of the pulmonary hila. LEFT pleural effusion appears larger than on prior examinations.  IMPRESSION: 1. LEFT lower lobe airspace disease favored to represent pneumonia or aspiration rather than asymmetric pulmonary edema. Followup in 4-6 weeks to ensure radiographic clearing and exclude an underlying lesion is recommended. 2. Mild cardiomegaly. 3. Pulmonary vascular congestion.   Electronically Signed   By: Dereck Ligas M.D.   On: 08/20/2013 15:12   Ct Chest Wo Contrast  08/27/2013   CLINICAL DATA:  Atypical chest pain.  EXAM: CT CHEST WITHOUT CONTRAST  TECHNIQUE: Multidetector CT imaging of the chest was performed following the standard protocol without IV contrast.  COMPARISON:  Chest radiographs 08/27/2013 and earlier  FINDINGS: No enlarged axillary or hilar lymph nodes are identified. There are multiple small mediastinal lymph nodes  measuring up to 1.1 cm in short axis. Advanced, three-vessel coronary artery calcifications are noted. Mitral annular and mild aortic valve calcification is noted. The main pulmonary artery is enlarged, measuring 3.8 cm in diameter. There is trace pericardial fluid. Trace bilateral pleural effusions are present, right larger than left.  Patchy, predominantly peribronchovascular ground-glass opacities are present in the right upper lobe. There is evidence of left upper lobe/lingular volume loss. Confluent opacity anteriorly in the left upper lobe/ lingula extending to the pleural surface may reflect atelectasis and scarring related to prior  infection/inflammation. Two adjacent pleural calcifications are noted. Evaluation of the lung bases is partially limited by motion, however there is more extensive ground-glass and patchy consolidative opacity in the left lower lobe. Mild ground-glass opacity is present in the basilar right lower lobe. A small amount of pleural fluid is present in the left major fissure.  Cholecystectomy clips are partially visualized in the upper abdomen. Extensive upper abdominal atherosclerotic vascular calcification is noted. Mild thoracic spondylosis is noted.  IMPRESSION: 1. Left lower lobe greater than right upper lobe opacities, concerning for multifocal infection. Right lower lobe ground-glass opacity may reflect atelectasis or additional infection. Radiographic follow-up recommended to ensure resolution with consideration for short-term follow-up chest CT after treatment. 2. Left upper lobe/lingular volume loss with subpleural opacity suggestive of chronic atelectasis. 3. Trace bilateral pleural effusions. 4. Main pulmonary artery enlargement, which can be seen in the setting of pulmonary arterial hypertension. 5. Advanced atherosclerosis. 6. Small mediastinal lymph nodes, most likely reactive.   Electronically Signed   By: Logan Bores   On: 08/27/2013 11:33   Nm Pulmonary Perf And Vent  08/20/2013   CLINICAL DATA:  Shortness of breath and chest pain. History of renal transplant. Atrial fibrillation.  EXAM: NUCLEAR MEDICINE VENTILATION - PERFUSION LUNG SCAN  TECHNIQUE: Ventilation images were obtained in multiple projections using inhaled aerosol technetium 99 M DTPA. Perfusion images were obtained in multiple projections after intravenous injection of Tc-83m MAA.  RADIOPHARMACEUTICALS:  40.0 mCi Tc-63m DTPA aerosol and 6.0 mCi Tc-76m MAA  COMPARISON:  Chest radiograph of earlier in the day.  FINDINGS: Ventilation: Heterogeneous tracer distribution. No wedge-shaped ventilation defects. Vague decreased ventilation to  the lingula and left upper lobe, possibly related to airspace disease on today's chest radiograph.  Perfusion: No wedge shaped peripheral perfusion defects to suggest acute pulmonary embolism.  IMPRESSION: No evidence of pulmonary embolism.   Electronically Signed   By: Abigail Miyamoto M.D.   On: 08/20/2013 15:44    Discharge Medications   Current Discharge Medication List    START taking these medications   Details  amLODipine (NORVASC) 5 MG tablet Take 1 tablet (5 mg total) by mouth daily. Qty: 30 tablet, Refills: 1    levofloxacin (LEVAQUIN) 500 MG tablet Take 1 tablet (500 mg total) by mouth every other day. Qty: 12 tablet, Refills: 0      CONTINUE these medications which have NOT CHANGED   Details  aspirin EC 81 MG tablet Take 81 mg by mouth daily.    atorvastatin (LIPITOR) 10 MG tablet Take 1 tablet (10 mg total) by mouth daily. Qty: 30 tablet, Refills: 11    calcitRIOL (ROCALTROL) 0.25 MCG capsule Take 0.25-0.5 mcg by mouth daily. Take 0.25 mcg daily alternating with 0.5 mcg    carvedilol (COREG) 3.125 MG tablet Take 1 tablet (3.125 mg total) by mouth 2 (two) times daily with a meal. Qty: 60 tablet, Refills: 11    furosemide (LASIX) 20 MG  tablet Take 20 mg by mouth daily as needed. Excess edema    isosorbide dinitrate (ISORDIL) 10 MG tablet Take 1 tablet (10 mg total) by mouth 3 (three) times daily. Qty: 90 tablet, Refills: 3    LUMIGAN 0.01 % SOLN Place 1 drop into both eyes at bedtime.     magnesium oxide (MAG-OX) 400 (241.3 MG) MG tablet Take 1 tablet (400 mg total) by mouth 2 (two) times daily. Qty: 60 tablet, Refills: 3    mycophenolate (MYFORTIC) 180 MG EC tablet Take 180 mg by mouth 2 (two) times daily.     nitroGLYCERIN (NITROSTAT) 0.4 MG SL tablet Place 1 tablet (0.4 mg total) under the tongue every 5 (five) minutes x 3 doses as needed for chest pain. Qty: 25 tablet, Refills: 12    ranitidine (ZANTAC) 150 MG tablet Take 150 mg by mouth daily.     ranolazine  (RANEXA) 500 MG 12 hr tablet Take 1 tablet (500 mg total) by mouth 2 (two) times daily. Qty: 60 tablet, Refills: 3    sodium bicarbonate 650 MG tablet Take 650 mg by mouth 2 (two) times daily.    tacrolimus (PROGRAF) 1 MG capsule Take 3-4 mg by mouth See admin instructions. 4 caps in am and 3 caps in pm.    timolol (BETIMOL) 0.5 % ophthalmic solution Place 1 drop into both eyes 2 (two) times daily.    warfarin (COUMADIN) 2.5 MG tablet Take 1.25-2.5 mg by mouth every evening. Alternating between 1.25mg  and 2.5mg  daily.    ALPHAGAN P 0.1 % SOLN Place 1 drop into both eyes at bedtime.         Disposition   The patient will be discharged in stable condition to home. Discharge Instructions   Discharge patient    Complete by:  As directed           Follow-up Information   Follow up with Simonne Maffucci, MD. Call in 2 weeks.   Specialty:  Pulmonary Disease   Contact information:   [place] [address] [phone]       Follow up with Kerin Ransom, PA-C On 09/16/2013. (APPT at 8:30 AM)    Contact information:   [address] [place], [place] [address] [phone]        Duration of Discharge Encounter: Greater than 45 minutes including physician and PA time.  Melvern Banker PA-C 09/01/2013, 8:35 AM  Attending Note:   The patient was seen and examined.  Agree with assessment and plan as noted above.  Changes made to the above note as needed.  Stable for Dc. See previous note   Ramond Dial., MD, [facility] 09/01/2013, 2:51 PM [address]  [place] Pager [phone]

## 2013-09-01 NOTE — Progress Notes (Signed)
Reviewed discharge instructions with patient and she stated her understanding.  Discharged home with family via wheelchair.  Sanda Linger

## 2013-09-01 NOTE — Telephone Encounter (Signed)
Message copied by Inge Rise on Tue Sep 01, 2013 10:02 AM ------      Message from: Simonne Maffucci B      Created: Mon Aug 31, 2013  2:15 PM       Hi,            Please arrange hospital F/U with [facility] or [facility] in two weeks with a CXR            Thanks      B ------

## 2013-09-02 ENCOUNTER — Other Ambulatory Visit: Payer: Medicare Other

## 2013-09-02 NOTE — Telephone Encounter (Signed)
Called pt. She is in prayer and will call back later. Will await call

## 2013-09-03 NOTE — Telephone Encounter (Signed)
Called tp x 2 and each time the phone was d/c when someone answered. WCB

## 2013-09-04 NOTE — Telephone Encounter (Signed)
lmomtcb x1 

## 2013-09-07 NOTE — Telephone Encounter (Signed)
Patient returning call.  [phone]

## 2013-09-07 NOTE — Telephone Encounter (Signed)
OK, maybe just try once more today

## 2013-09-07 NOTE — Telephone Encounter (Signed)
lmomtcb x2 for pt.  Will also forward to BQ we are having hard time getting pt appt scheduled

## 2013-09-07 NOTE — Telephone Encounter (Signed)
lmomtcb x1 

## 2013-09-07 NOTE — Telephone Encounter (Signed)
Called pt. She scheduled HFU 09/22/13 w/ BQ

## 2013-09-10 ENCOUNTER — Ambulatory Visit (HOSPITAL_COMMUNITY): Payer: Medicare Other

## 2013-09-15 ENCOUNTER — Encounter (HOSPITAL_COMMUNITY): Payer: Self-pay | Admitting: Emergency Medicine

## 2013-09-15 ENCOUNTER — Emergency Department (HOSPITAL_COMMUNITY): Payer: Medicare Other

## 2013-09-15 ENCOUNTER — Emergency Department (HOSPITAL_COMMUNITY)
Admission: EM | Admit: 2013-09-15 | Discharge: 2013-09-15 | Disposition: A | Discharge status: Home / Self Care | Payer: Medicare Other | Attending: Family Medicine | Admitting: Family Medicine

## 2013-09-15 DIAGNOSIS — I2510 Atherosclerotic heart disease of native coronary artery without angina pectoris: Secondary | ICD-10-CM | POA: Diagnosis not present

## 2013-09-15 DIAGNOSIS — R011 Cardiac murmur, unspecified: Secondary | ICD-10-CM | POA: Insufficient documentation

## 2013-09-15 DIAGNOSIS — Z8669 Personal history of other diseases of the nervous system and sense organs: Secondary | ICD-10-CM | POA: Insufficient documentation

## 2013-09-15 DIAGNOSIS — N186 End stage renal disease: Secondary | ICD-10-CM | POA: Insufficient documentation

## 2013-09-15 DIAGNOSIS — Z992 Dependence on renal dialysis: Secondary | ICD-10-CM | POA: Insufficient documentation

## 2013-09-15 DIAGNOSIS — Z7982 Long term (current) use of aspirin: Secondary | ICD-10-CM | POA: Insufficient documentation

## 2013-09-15 DIAGNOSIS — Z7901 Long term (current) use of anticoagulants: Secondary | ICD-10-CM | POA: Diagnosis not present

## 2013-09-15 DIAGNOSIS — M19049 Primary osteoarthritis, unspecified hand: Secondary | ICD-10-CM | POA: Insufficient documentation

## 2013-09-15 DIAGNOSIS — S99919A Unspecified injury of unspecified ankle, initial encounter: Secondary | ICD-10-CM | POA: Diagnosis present

## 2013-09-15 DIAGNOSIS — Z880 Allergy status to penicillin: Secondary | ICD-10-CM | POA: Diagnosis not present

## 2013-09-15 DIAGNOSIS — Z87891 Personal history of nicotine dependence: Secondary | ICD-10-CM | POA: Diagnosis not present

## 2013-09-15 DIAGNOSIS — S99929A Unspecified injury of unspecified foot, initial encounter: Secondary | ICD-10-CM

## 2013-09-15 DIAGNOSIS — Z8701 Personal history of pneumonia (recurrent): Secondary | ICD-10-CM | POA: Diagnosis not present

## 2013-09-15 DIAGNOSIS — IMO0002 Reserved for concepts with insufficient information to code with codable children: Secondary | ICD-10-CM | POA: Insufficient documentation

## 2013-09-15 DIAGNOSIS — Z860100 Personal history of colon polyps, unspecified: Secondary | ICD-10-CM | POA: Insufficient documentation

## 2013-09-15 DIAGNOSIS — X500XXA Overexertion from strenuous movement or load, initial encounter: Secondary | ICD-10-CM | POA: Insufficient documentation

## 2013-09-15 DIAGNOSIS — Z86718 Personal history of other venous thrombosis and embolism: Secondary | ICD-10-CM | POA: Diagnosis not present

## 2013-09-15 DIAGNOSIS — I120 Hypertensive chronic kidney disease with stage 5 chronic kidney disease or end stage renal disease: Secondary | ICD-10-CM | POA: Diagnosis not present

## 2013-09-15 DIAGNOSIS — S8990XA Unspecified injury of unspecified lower leg, initial encounter: Secondary | ICD-10-CM | POA: Diagnosis present

## 2013-09-15 DIAGNOSIS — K219 Gastro-esophageal reflux disease without esophagitis: Secondary | ICD-10-CM | POA: Diagnosis not present

## 2013-09-15 DIAGNOSIS — Z79899 Other long term (current) drug therapy: Secondary | ICD-10-CM | POA: Diagnosis not present

## 2013-09-15 DIAGNOSIS — E785 Hyperlipidemia, unspecified: Secondary | ICD-10-CM | POA: Insufficient documentation

## 2013-09-15 DIAGNOSIS — Y9339 Activity, other involving climbing, rappelling and jumping off: Secondary | ICD-10-CM | POA: Insufficient documentation

## 2013-09-15 DIAGNOSIS — Y9289 Other specified places as the place of occurrence of the external cause: Secondary | ICD-10-CM | POA: Diagnosis not present

## 2013-09-15 DIAGNOSIS — M2392 Unspecified internal derangement of left knee: Secondary | ICD-10-CM

## 2013-09-15 DIAGNOSIS — H409 Unspecified glaucoma: Secondary | ICD-10-CM | POA: Insufficient documentation

## 2013-09-15 MED ORDER — TRAMADOL HCL 50 MG PO TABS: 50.0000 mg | ORAL_TABLET | Freq: Four times a day (QID) | ORAL | Status: DC | PRN

## 2013-09-15 NOTE — ED Provider Notes (Signed)
CSN: [id number]     Arrival date & time 09/15/13  1400 History   First MD Initiated Contact with Patient 09/15/13 1512     Chief Complaint  Patient presents with  . Knee Pain     (Consider location/radiation/quality/duration/timing/severity/associated sxs/prior Treatment) Patient is a 63 y.o. female presenting with knee pain. The history is provided by the patient.  Knee Pain Location:  Knee Time since incident:  2 days Injury: no   Knee location:  L knee Pain details:    Quality:  Sharp and shooting (felt pop when climbing into bed on sun.)   Severity:  Moderate   Onset quality:  Sudden   Progression:  Unchanged Chronicity:  New Dislocation: no   Prior injury to area:  No Relieved by:  None tried Associated symptoms: decreased ROM and swelling     Past Medical History  Diagnosis Date  . Kidney transplant as cause of abnormal reaction or later complication   . Hypertension   . Colon polyps   . Gall stones 1980  . Anti-phospholipid antibody syndrome     (Based on hospital discharge summary march 2013)  . Atrial fibrillation   . Carpal tunnel syndrome 2003    lt  . Esophageal stricture   . Hemorrhoids   . Renal disorder   . ESRD (end stage renal disease) on dialysis     "til I got my transplant in 2010"  . DVT (deep venous thrombosis) 1990    LLE  . History of blood transfusion     "related to the lupus"  . Dyslipidemia 07/22/2013  . Coronary artery disease   . Heart murmur   . Pneumonia     "3 times now" (08/25/2013)  . GERD (gastroesophageal reflux disease)   . Arthritis     "fingers" (08/25/2013)  . Glaucoma, left eye    Past Surgical History  Procedure Laterality Date  . Nephrectomy transplanted organ  05/2008  . Cholecystectomy  1980  . Hematoma evacuation  1998    "after they took peritoneal catheter out"  . Peritoneal catheter insertion    . Peritoneal catheter removal    . Carpal tunnel release Right 07/2001  . Arteriovenous graft placement    .  Thrombectomy and revision of arterioventous (av) goretex  graft Right 07/1999; 04/2002; 09/2002; 12/2005; 09/2007;     upper arm/notes 07/31/1999; 05/18/2002; 10/07/2002; 01/14/2006; 09/14/2007;    Family History  Problem Relation Age of Onset  . Hypertension Mother   . Heart disease Mother   . Colon cancer Neg Hx   . Rectal cancer Neg Hx   . Stomach cancer Neg Hx    History  Substance Use Topics  . Smoking status: Former Smoker -- 1.00 packs/day for 30 years    Types: Cigarettes    Quit date: 04/16/1977  . Smokeless tobacco: Never Used  . Alcohol Use: Yes     Comment: "quit drinking in 1979"   OB History   Grav Para Term Preterm Abortions TAB SAB Ect Mult Living                 Review of Systems  Constitutional: Negative.   Musculoskeletal: Positive for joint swelling. Negative for myalgias.  Skin: Negative.       Allergies  Gentamicin; Gentamycin; Penicillins; and Vancomycin  Home Medications   Prior to Admission medications   Medication Sig Start Date End Date Taking? Authorizing Provider  ALPHAGAN P 0.1 % SOLN Place 1 drop into both eyes at  bedtime.  10/11/11   Historical Provider, MD  amLODipine (NORVASC) 5 MG tablet Take 1 tablet (5 mg total) by mouth daily. 09/01/13   Rise Mu, PA-C  aspirin EC 81 MG tablet Take 81 mg by mouth daily.    Historical Provider, MD  atorvastatin (LIPITOR) 10 MG tablet Take 1 tablet (10 mg total) by mouth daily. 07/22/13   Sueanne Margarita, MD  calcitRIOL (ROCALTROL) 0.25 MCG capsule Take 0.25-0.5 mcg by mouth daily. Take 0.25 mcg daily alternating with 0.5 mcg 06/12/13   Historical Provider, MD  carvedilol (COREG) 3.125 MG tablet Take 1 tablet (3.125 mg total) by mouth 2 (two) times daily with a meal. 08/26/13   Evelene Croon Barrett, PA-C  furosemide (LASIX) 20 MG tablet Take 20 mg by mouth daily as needed. Excess edema    Historical Provider, MD  isosorbide dinitrate (ISORDIL) 10 MG tablet Take 1 tablet (10 mg total) by mouth 3 (three) times daily.  08/26/13   Rhonda G Barrett, PA-C  levofloxacin (LEVAQUIN) 500 MG tablet Take 1 tablet (500 mg total) by mouth every other day. 09/02/13   Ryan M Dunn, PA-C  LUMIGAN 0.01 % SOLN Place 1 drop into both eyes at bedtime.  10/30/11   Historical Provider, MD  magnesium oxide (MAG-OX) 400 (241.3 MG) MG tablet Take 1 tablet (400 mg total) by mouth 2 (two) times daily. 08/26/13   Rhonda G Barrett, PA-C  mycophenolate (MYFORTIC) 180 MG EC tablet Take 180 mg by mouth 2 (two) times daily.     Historical Provider, MD  nitroGLYCERIN (NITROSTAT) 0.4 MG SL tablet Place 1 tablet (0.4 mg total) under the tongue every 5 (five) minutes x 3 doses as needed for chest pain. 08/26/13   Rhonda G Barrett, PA-C  ranitidine (ZANTAC) 150 MG tablet Take 150 mg by mouth daily.     Historical Provider, MD  ranolazine (RANEXA) 500 MG 12 hr tablet Take 1 tablet (500 mg total) by mouth 2 (two) times daily. 08/26/13   Rhonda G Barrett, PA-C  sodium bicarbonate 650 MG tablet Take 650 mg by mouth 2 (two) times daily.    Historical Provider, MD  tacrolimus (PROGRAF) 1 MG capsule Take 3-4 mg by mouth See admin instructions. 4 caps in am and 3 caps in pm.    Historical Provider, MD  timolol (BETIMOL) 0.5 % ophthalmic solution Place 1 drop into both eyes 2 (two) times daily.    Historical Provider, MD  warfarin (COUMADIN) 2.5 MG tablet Take 1.25-2.5 mg by mouth every evening. Alternating between 1.25mg  and 2.5mg  daily.    Historical Provider, MD   BP 144/63  Pulse 71  Temp(Src) 98.3 F (36.8 C) (Oral)  Resp 20  SpO2 100% Physical Exam  Nursing note and vitals reviewed. Constitutional: She is oriented to person, place, and time. She appears well-developed and well-nourished.  Musculoskeletal: She exhibits tenderness.       Left knee: She exhibits decreased range of motion, swelling and effusion. She exhibits no erythema, normal alignment, no LCL laxity, normal patellar mobility, normal meniscus and no MCL laxity. Tenderness found. Medial joint  line and lateral joint line tenderness noted. No patellar tendon tenderness noted.  Neurological: She is alert and oriented to person, place, and time.  Skin: Skin is warm and dry.    ED Course  Procedures (including critical care time) Labs Review Labs Reviewed - No data to display  Imaging Review Dg Knee Complete 4 Views Left  09/15/2013   CLINICAL DATA:  Left knee pain post injury 3 days ago  EXAM: LEFT KNEE - COMPLETE 4+ VIEW  COMPARISON:  None.  FINDINGS: Four views of the left knee submitted. No acute fracture or subluxation. There is diffuse osteopenia. Atherosclerotic calcifications of femoral and popliteal artery. Moderate joint effusion. Narrowing of patellofemoral joint space. Spurring of patella. Mild narrowing of medial joint compartment.  IMPRESSION: No acute fracture or subluxation. Diffuse osteopenia. Moderate joint effusion. Osteoarthritic changes as described above.   Electronically Signed   By: Lahoma Crocker M.D.   On: 09/15/2013 15:23     EKG Interpretation None      MDM   Final diagnoses:  Internal derangement of knee joint, left        Billy Fischer, MD 09/15/13 [phone]

## 2013-09-15 NOTE — Progress Notes (Signed)
Orthopedic Tech Progress Note Patient Details:  Debbie [name] [birth year] [id number]  Ortho Devices Type of Ortho Device: Knee Immobilizer Ortho Device/Splint Location: LLE Ortho Device/Splint Interventions: Ordered;Application   Braulio Bosch 09/15/2013, 4:14 PM

## 2013-09-15 NOTE — ED Notes (Signed)
Left knee pain that started to hurt on Sunday night felt a pop and now it is swelling

## 2013-09-15 NOTE — Discharge Instructions (Signed)
Wear brace for comfort, see orthopedist next week.

## 2013-09-15 NOTE — ED Notes (Signed)
Paged Ortho for knee immobilizer.

## 2013-09-15 NOTE — ED Notes (Signed)
Pt verbalizes understanding of d.c instructions and denies and further needs at this time.

## 2013-09-16 ENCOUNTER — Encounter: Payer: Medicare Other | Admitting: Cardiology

## 2013-09-22 ENCOUNTER — Inpatient Hospital Stay: Payer: Medicare Other | Admitting: Pulmonary Disease

## 2013-09-29 ENCOUNTER — Ambulatory Visit (INDEPENDENT_AMBULATORY_CARE_PROVIDER_SITE_OTHER): Payer: Medicare Other | Admitting: Cardiology

## 2013-09-29 ENCOUNTER — Encounter: Payer: Self-pay | Admitting: Cardiology

## 2013-09-29 VITALS — BP 116/58 | HR 68 | Ht 60.0 in | Wt 162.0 lb

## 2013-09-29 DIAGNOSIS — I272 Pulmonary hypertension, unspecified: Secondary | ICD-10-CM | POA: Insufficient documentation

## 2013-09-29 DIAGNOSIS — N184 Chronic kidney disease, stage 4 (severe): Secondary | ICD-10-CM

## 2013-09-29 DIAGNOSIS — R079 Chest pain, unspecified: Secondary | ICD-10-CM

## 2013-09-29 DIAGNOSIS — I4891 Unspecified atrial fibrillation: Secondary | ICD-10-CM

## 2013-09-29 DIAGNOSIS — I2789 Other specified pulmonary heart diseases: Secondary | ICD-10-CM

## 2013-09-29 DIAGNOSIS — E785 Hyperlipidemia, unspecified: Secondary | ICD-10-CM

## 2013-09-29 DIAGNOSIS — I480 Paroxysmal atrial fibrillation: Secondary | ICD-10-CM

## 2013-09-29 DIAGNOSIS — I209 Angina pectoris, unspecified: Secondary | ICD-10-CM

## 2013-09-29 DIAGNOSIS — Z7901 Long term (current) use of anticoagulants: Secondary | ICD-10-CM

## 2013-09-29 NOTE — Patient Instructions (Signed)
Your physician recommends that you continue on your current medications as directed. Please refer to the Current Medication list given to you today.   Your physician wants you to follow-up in: DR TURNER IN 6 MONTHS  You will receive a reminder letter in the mail two months in advance. If you don't receive a letter, please call our office to schedule the follow-up appointment.

## 2013-09-29 NOTE — Assessment & Plan Note (Signed)
For anti-phospholipid antibody syndrome,

## 2013-09-29 NOTE — Assessment & Plan Note (Signed)
Admitted in June, and twice in Aug. Myoview mildly abnormal, not cathed secondary to CRI.

## 2013-09-29 NOTE — Assessment & Plan Note (Signed)
Followed by [facility]. S/P transplant 2010 at [facility].

## 2013-09-29 NOTE — Assessment & Plan Note (Signed)
On statin Rx 

## 2013-09-29 NOTE — Assessment & Plan Note (Signed)
In 2010 peri-op renal transplant

## 2013-09-29 NOTE — Progress Notes (Signed)
09/29/2013 Debbie [name]   1950-06-22  [id number]  Primary Physicia DETERDING,JAMES L, MD Primary Cardiologist: Dr Radford Pax  HPI:  63 y/o female followed by Dr Radford Pax and Dr Deterding with a history of chest pain and CRI. She had a renal transplant in 2010 at [facility]. She was admitted in June and twice in Aug for chest pain. Myoview in June showed mild ischemia but it was elected to treat her medically as opposed to cath secondary to risk of renal injury. During her most recetn admission she was treated for pneumonia. She had a Rt heart cath and her PCWP was 24- diuresis was recommended. Her wgt today is 162, (I could not find her admission wgt). She has had some edema but denies orthopnea or PND. Apparently Amlodipine was new. She has not had chest pain. She doesn't take Lasix because she is afraid of low potasium.    Current Outpatient Prescriptions  Medication Sig Dispense Refill  . ALPHAGAN P 0.1 % SOLN Place 1 drop into both eyes at bedtime.       Marland Kitchen amLODipine (NORVASC) 5 MG tablet Take 1 tablet (5 mg total) by mouth daily.  30 tablet  1  . aspirin EC 81 MG tablet Take by mouth.      Marland Kitchen atorvastatin (LIPITOR) 10 MG tablet Take 1 tablet (10 mg total) by mouth daily.  30 tablet  11  . bimatoprost (LUMIGAN) 0.01 % SOLN 1 drop.      . calcitRIOL (ROCALTROL) 0.25 MCG capsule Take 0.25-0.5 mcg by mouth daily. Take 0.25 mcg daily alternating with 0.5 mcg      . carvedilol (COREG) 3.125 MG tablet Take 1 tablet (3.125 mg total) by mouth 2 (two) times daily with a meal.  60 tablet  11  . cefdinir (OMNICEF) 300 MG capsule       . ciprofloxacin (CIPRO) 250 MG tablet       . doxycycline (VIBRAMYCIN) 100 MG capsule       . enoxaparin (LOVENOX) 80 MG/0.8ML injection       . furosemide (LASIX) 20 MG tablet Take 20 mg by mouth daily as needed. Excess edema      . isosorbide dinitrate (ISORDIL) 10 MG tablet Take by mouth.      . labetalol (NORMODYNE) 200 MG tablet Take 100 mg by mouth.      .  levofloxacin (LEVAQUIN) 500 MG tablet Take by mouth.      . loperamide (IMODIUM) 2 MG capsule Take 1-2 capsules four times a day as needed for diarrhea.  Max is 8 capsules in 24 hours.      Marland Kitchen LUMIGAN 0.01 % SOLN Place 1 drop into both eyes at bedtime.       . magnesium oxide (MAG-OX) 400 (241.3 MG) MG tablet Take 1 tablet (400 mg total) by mouth 2 (two) times daily.  60 tablet  3  . mycophenolate (MYFORTIC) 180 MG EC tablet Take 180 mg by mouth 2 (two) times daily.       . nitroGLYCERIN (NITROSTAT) 0.4 MG SL tablet Place under the tongue.      . predniSONE (DELTASONE) 5 MG tablet Take 5 mg by mouth.      . ranitidine (ZANTAC) 150 MG tablet Take 150 mg by mouth daily.       . ranolazine (RANEXA) 500 MG 12 hr tablet Take by mouth.      . sodium bicarbonate 650 MG tablet Take 650 mg by mouth.      Marland Kitchen  Specialty Vitamins Products (MAGNESIUM, AMINO ACID CHELATE,) 133 MG tablet Take 1 tablet by mouth.      . tacrolimus (PROGRAF) 1 MG capsule Take 3-4 mg by mouth See admin instructions. 4 caps in am and 3 caps in pm.      . tacrolimus (PROGRAF) 1 MG capsule Take 3 mg by mouth.      . timolol (BETIMOL) 0.5 % ophthalmic solution Place 1 drop into both eyes 2 (two) times daily.      . timolol (TIMOPTIC) 0.5 % ophthalmic solution 1 drop.      . traMADol (ULTRAM) 50 MG tablet Take 1 tablet (50 mg total) by mouth every 6 (six) hours as needed for moderate pain.  20 tablet  0  . warfarin (COUMADIN) 2.5 MG tablet Take 1.25-2.5 mg by mouth every evening. Alternating between 1.25mg  and 2.5mg  daily.      Marland Kitchen warfarin (COUMADIN) 2.5 MG tablet Takes 1.25 mg Sat Sun  Pt takes   2.5mg  Monday-Friday       No current facility-administered medications for this visit.    Allergies  Allergen Reactions  . Vancomycin Itching, Swelling and Anaphylaxis    Tongue swell  . Cefazolin Itching  . Codeine Itching  . Gentamicin Itching    unknown  . Gentamycin [Gentamicin Sulfate] Itching and Swelling  .  Hydrocodone-Acetaminophen Itching  . Penicillins Itching  . Dorzolamide Hcl-Timolol Mal Rash    Eye pain  . Latanoprost Rash    Eye pain    History   Social History  . Marital Status: Married    Spouse Name: N/A    Number of Children: 3  . Years of Education: N/A   Occupational History  . disabled    Social History Main Topics  . Smoking status: Former Smoker -- 1.00 packs/day for 30 years    Types: Cigarettes    Quit date: 04/16/1977  . Smokeless tobacco: Never Used  . Alcohol Use: Yes     Comment: "quit drinking in 1979"  . Drug Use: No  . Sexual Activity: Not Currently   Other Topics Concern  . Not on file   Social History Narrative  . No narrative on file     Review of Systems: General: negative for chills, fever, night sweats or weight changes.  Cardiovascular: negative for chest pain, dyspnea on exertion, edema, orthopnea, palpitations, paroxysmal nocturnal dyspnea or shortness of breath Dermatological: negative for rash Respiratory: negative for cough or wheezing Urologic: negative for hematuria Abdominal: negative for nausea, vomiting, diarrhea, bright red blood per rectum, melena, or hematemesis Neurologic: negative for visual changes, syncope, or dizziness All other systems reviewed and are otherwise negative except as noted above.    Blood pressure 116/58, pulse 68, height 5' (1.524 m), weight 162 lb (73.483 kg).  General appearance: alert, cooperative and no distress Neck: no carotid bruit and no JVD Lungs: clear to auscultation bilaterally Heart: regular rate and rhythm and 2/6 systolic murmur Extremities: 1+ edema    ASSESSMENT AND PLAN:   Chest pain with moderate risk of acute coronary syndrome Admitted in June, and twice in Aug. Myoview mildly abnormal, not cathed secondary to CRI.   CKD (chronic kidney disease), stage IV Followed by [facility]. S/P transplant 2010 at [facility].  Pulmonary hypertension PA 73/ 31 at Rt heart cath  Aug 2015.  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 I offered to try a different Ca++ blocker but she declined. I told she could take her Lasix PRN as ordered and just have a banana that day. She is to see Dr Deyerding next month. She can see Dr Radford Pax in 6 months or sooner if needed.   Caroline Longie KPA-C 09/29/2013 11:03 AM

## 2013-09-29 NOTE — Assessment & Plan Note (Signed)
PA 73/ 31 at Rt heart cath Aug 2015.

## 2013-10-20 ENCOUNTER — Inpatient Hospital Stay: Payer: Medicare Other | Admitting: Pulmonary Disease

## 2013-11-05 ENCOUNTER — Inpatient Hospital Stay: Payer: Medicare Other | Admitting: Pulmonary Disease

## 2013-11-09 ENCOUNTER — Ambulatory Visit: Payer: Medicare Other | Admitting: Cardiology

## 2013-11-18 ENCOUNTER — Other Ambulatory Visit: Payer: Self-pay | Admitting: Nurse Practitioner

## 2013-11-24 ENCOUNTER — Ambulatory Visit: Payer: Medicare Other | Admitting: Pulmonary Disease

## 2013-11-24 NOTE — Progress Notes (Signed)
° °  Subjective:    Patient ID: Debbie [name], female    DOB: 1950-02-05, 63 y.o.   MRN: [id number]  HPI  No show  Review of Systems     Objective:   Physical Exam        Assessment & Plan:

## 2013-11-28 ENCOUNTER — Other Ambulatory Visit: Payer: Self-pay | Admitting: Physician Assistant

## 2013-11-30 ENCOUNTER — Other Ambulatory Visit: Payer: Self-pay

## 2013-12-28 ENCOUNTER — Other Ambulatory Visit: Payer: Self-pay | Admitting: Physician Assistant

## 2013-12-29 ENCOUNTER — Other Ambulatory Visit: Payer: Self-pay | Admitting: Physician Assistant

## 2013-12-31 ENCOUNTER — Encounter (HOSPITAL_COMMUNITY): Payer: Self-pay | Admitting: Interventional Cardiology

## 2014-02-01 ENCOUNTER — Telehealth: Payer: Self-pay | Admitting: Cardiology

## 2014-02-01 NOTE — Telephone Encounter (Signed)
Please find out who put patient on Labetolol

## 2014-02-02 ENCOUNTER — Other Ambulatory Visit: Payer: Self-pay | Admitting: Cardiology

## 2014-02-02 NOTE — Telephone Encounter (Signed)
Left message to call back  

## 2014-02-03 NOTE — Telephone Encounter (Signed)
Please find out if Dr. Jimmy Footman wanted her to stay on Coreg as well

## 2014-02-03 NOTE — Telephone Encounter (Signed)
Pt st Dr. Jimmy Footman restarted labetalol at her last OV 1/5.

## 2014-02-03 NOTE — Telephone Encounter (Signed)
Patient st Dr. Jimmy Footman did not tell her to stop the Coreg. She st she has been taking both.  Will call Dr. Deterding's office to follow-up.

## 2014-02-04 ENCOUNTER — Emergency Department (HOSPITAL_COMMUNITY): Payer: Medicare Other

## 2014-02-04 ENCOUNTER — Encounter (HOSPITAL_COMMUNITY): Payer: Self-pay | Admitting: Emergency Medicine

## 2014-02-04 ENCOUNTER — Emergency Department (HOSPITAL_COMMUNITY)
Admission: EM | Admit: 2014-02-04 | Discharge: 2014-02-04 | Disposition: A | Discharge status: Home / Self Care | Payer: Medicare Other | Attending: Emergency Medicine | Admitting: Emergency Medicine

## 2014-02-04 ENCOUNTER — Other Ambulatory Visit (HOSPITAL_COMMUNITY): Payer: Self-pay | Admitting: *Deleted

## 2014-02-04 DIAGNOSIS — Z86718 Personal history of other venous thrombosis and embolism: Secondary | ICD-10-CM | POA: Diagnosis not present

## 2014-02-04 DIAGNOSIS — I4891 Unspecified atrial fibrillation: Secondary | ICD-10-CM | POA: Insufficient documentation

## 2014-02-04 DIAGNOSIS — M545 Low back pain: Secondary | ICD-10-CM | POA: Insufficient documentation

## 2014-02-04 DIAGNOSIS — Z7982 Long term (current) use of aspirin: Secondary | ICD-10-CM | POA: Insufficient documentation

## 2014-02-04 DIAGNOSIS — Z79899 Other long term (current) drug therapy: Secondary | ICD-10-CM | POA: Insufficient documentation

## 2014-02-04 DIAGNOSIS — Z7952 Long term (current) use of systemic steroids: Secondary | ICD-10-CM | POA: Insufficient documentation

## 2014-02-04 DIAGNOSIS — R079 Chest pain, unspecified: Secondary | ICD-10-CM | POA: Insufficient documentation

## 2014-02-04 DIAGNOSIS — J209 Acute bronchitis, unspecified: Secondary | ICD-10-CM | POA: Diagnosis not present

## 2014-02-04 DIAGNOSIS — E785 Hyperlipidemia, unspecified: Secondary | ICD-10-CM | POA: Insufficient documentation

## 2014-02-04 DIAGNOSIS — Z87891 Personal history of nicotine dependence: Secondary | ICD-10-CM | POA: Insufficient documentation

## 2014-02-04 DIAGNOSIS — Z7901 Long term (current) use of anticoagulants: Secondary | ICD-10-CM | POA: Diagnosis not present

## 2014-02-04 DIAGNOSIS — Z7902 Long term (current) use of antithrombotics/antiplatelets: Secondary | ICD-10-CM | POA: Insufficient documentation

## 2014-02-04 DIAGNOSIS — Z8669 Personal history of other diseases of the nervous system and sense organs: Secondary | ICD-10-CM | POA: Insufficient documentation

## 2014-02-04 DIAGNOSIS — I2510 Atherosclerotic heart disease of native coronary artery without angina pectoris: Secondary | ICD-10-CM | POA: Diagnosis not present

## 2014-02-04 DIAGNOSIS — N186 End stage renal disease: Secondary | ICD-10-CM | POA: Insufficient documentation

## 2014-02-04 DIAGNOSIS — R011 Cardiac murmur, unspecified: Secondary | ICD-10-CM | POA: Insufficient documentation

## 2014-02-04 DIAGNOSIS — R05 Cough: Secondary | ICD-10-CM | POA: Diagnosis present

## 2014-02-04 DIAGNOSIS — I120 Hypertensive chronic kidney disease with stage 5 chronic kidney disease or end stage renal disease: Secondary | ICD-10-CM | POA: Diagnosis not present

## 2014-02-04 DIAGNOSIS — Z940 Kidney transplant status: Secondary | ICD-10-CM | POA: Insufficient documentation

## 2014-02-04 DIAGNOSIS — J40 Bronchitis, not specified as acute or chronic: Secondary | ICD-10-CM

## 2014-02-04 DIAGNOSIS — Z792 Long term (current) use of antibiotics: Secondary | ICD-10-CM | POA: Insufficient documentation

## 2014-02-04 DIAGNOSIS — H409 Unspecified glaucoma: Secondary | ICD-10-CM | POA: Insufficient documentation

## 2014-02-04 DIAGNOSIS — Z86010 Personal history of colonic polyps: Secondary | ICD-10-CM | POA: Insufficient documentation

## 2014-02-04 DIAGNOSIS — Z8701 Personal history of pneumonia (recurrent): Secondary | ICD-10-CM | POA: Insufficient documentation

## 2014-02-04 DIAGNOSIS — Z880 Allergy status to penicillin: Secondary | ICD-10-CM | POA: Insufficient documentation

## 2014-02-04 DIAGNOSIS — K219 Gastro-esophageal reflux disease without esophagitis: Secondary | ICD-10-CM | POA: Diagnosis not present

## 2014-02-04 DIAGNOSIS — M1990 Unspecified osteoarthritis, unspecified site: Secondary | ICD-10-CM | POA: Insufficient documentation

## 2014-02-04 LAB — CBC WITH DIFFERENTIAL/PLATELET
BASOS PCT: 0 % (ref 0–1)
Basophils Absolute: 0 10*3/uL (ref 0.0–0.1)
EOS PCT: 2 % (ref 0–5)
Eosinophils Absolute: 0.2 10*3/uL (ref 0.0–0.7)
HCT: 25.2 % — ABNORMAL LOW (ref 36.0–46.0)
HEMOGLOBIN: 8.2 g/dL — AB (ref 12.0–15.0)
LYMPHS ABS: 1.7 10*3/uL (ref 0.7–4.0)
Lymphocytes Relative: 19 % (ref 12–46)
MCH: 26.8 pg (ref 26.0–34.0)
MCHC: 32.5 g/dL (ref 30.0–36.0)
MCV: 82.4 fL (ref 78.0–100.0)
MONO ABS: 0.6 10*3/uL (ref 0.1–1.0)
Monocytes Relative: 7 % (ref 3–12)
Neutro Abs: 6.6 10*3/uL (ref 1.7–7.7)
Neutrophils Relative %: 72 % (ref 43–77)
Platelets: 209 10*3/uL (ref 150–400)
RBC: 3.06 MIL/uL — ABNORMAL LOW (ref 3.87–5.11)
RDW: 15.9 % — AB (ref 11.5–15.5)
WBC: 9.1 10*3/uL (ref 4.0–10.5)

## 2014-02-04 LAB — I-STAT TROPONIN, ED: Troponin i, poc: 0.01 ng/mL (ref 0.00–0.08)

## 2014-02-04 LAB — BASIC METABOLIC PANEL
Anion gap: 10 (ref 5–15)
BUN: 31 mg/dL — AB (ref 6–23)
CALCIUM: 9.6 mg/dL (ref 8.4–10.5)
CO2: 21 mmol/L (ref 19–32)
CREATININE: 2.99 mg/dL — AB (ref 0.50–1.10)
Chloride: 100 mEq/L (ref 96–112)
GFR calc Af Amer: 18 mL/min — ABNORMAL LOW (ref >90)
GFR calc non Af Amer: 15 mL/min — ABNORMAL LOW (ref >90)
Glucose, Bld: 112 mg/dL — ABNORMAL HIGH (ref 70–99)
Potassium: 5 mmol/L (ref 3.5–5.1)
Sodium: 131 mmol/L — ABNORMAL LOW (ref 135–145)

## 2014-02-04 LAB — BRAIN NATRIURETIC PEPTIDE: B Natriuretic Peptide: 1016.9 pg/mL — ABNORMAL HIGH (ref 0.0–100.0)

## 2014-02-04 MED ORDER — LEVOFLOXACIN 750 MG PO TABS
750.0000 mg | ORAL_TABLET | Freq: Once | ORAL | Status: AC
  Administered 2014-02-04: 750 mg via ORAL
  Filled 2014-02-04: qty 1

## 2014-02-04 MED ORDER — LEVOFLOXACIN 500 MG PO TABS: 500.0000 mg | ORAL_TABLET | ORAL | Status: DC

## 2014-02-04 NOTE — Discharge Instructions (Signed)
Please call your doctor for a followup appointment within 24-48 hours. When you talk to your doctor please let them know that you were seen in the emergency department and have them acquire all of your records so that they can discuss the findings with you and formulate a treatment plan to fully care for your new and ongoing problems.  Levaquin one dose on Saturday, Monday and Wednesday.  Any time you take a medicine like Levaquin - it can change your coumadin levels and this MUST be checked in the next week

## 2014-02-04 NOTE — ED Provider Notes (Signed)
CSN: [id number]     Arrival date & time 02/04/14  1408 History   First MD Initiated Contact with Patient 02/04/14 1728     Chief Complaint  Patient presents with  . Cough  . Chest Pain  . Back Pain     (Consider location/radiation/quality/duration/timing/severity/associated sxs/prior Treatment) HPI Comments: Pt has been coughing for 3 weeks - has been seen at PMD - put on cough suprressant with initial imporovement, then it came back, no other sx, just a produtive cough - 2 days of keflex, sx are constant, mild, and nothing makes better - no swelling in legs.  Reviewed EMR, hx of Echo without CHF in last year and stress without ischemia in last 6 months.    Patient is a 64 y.o. female presenting with cough, chest pain, and back pain. The history is provided by the patient and a relative.  Cough Associated symptoms: chest pain   Chest Pain Associated symptoms: back pain and cough   Back Pain Associated symptoms: chest pain     Past Medical History  Diagnosis Date  . Kidney transplant as cause of abnormal reaction or later complication   . Hypertension   . Colon polyps   . Gall stones 1980  . Anti-phospholipid antibody syndrome     (Based on hospital discharge summary march 2013)  . Atrial fibrillation   . Carpal tunnel syndrome 2003    lt  . Esophageal stricture   . Hemorrhoids   . Renal disorder   . ESRD (end stage renal disease) on dialysis     "til I got my transplant in 2010"  . DVT (deep venous thrombosis) 1990    LLE  . History of blood transfusion     "related to the lupus"  . Dyslipidemia 07/22/2013  . Coronary artery disease   . Heart murmur   . Pneumonia     "3 times now" (08/25/2013)  . GERD (gastroesophageal reflux disease)   . Arthritis     "fingers" (08/25/2013)  . Glaucoma, left eye    Past Surgical History  Procedure Laterality Date  . Nephrectomy transplanted organ  05/2008  . Cholecystectomy  1980  . Hematoma evacuation  1998    "after they took  peritoneal catheter out"  . Peritoneal catheter insertion    . Peritoneal catheter removal    . Carpal tunnel release Right 07/2001  . Arteriovenous graft placement    . Thrombectomy and revision of arterioventous (av) goretex  graft Right 07/1999; 04/2002; 09/2002; 12/2005; 09/2007;     upper arm/notes 07/31/1999; 05/18/2002; 10/07/2002; 01/14/2006; 09/14/2007;   . Right heart catheterization N/A 08/28/2013    Procedure: RIGHT HEART CATH;  Surgeon: Sinclair Grooms, MD;  Location: [facility] CATH LAB;  Service: Cardiovascular;  Laterality: N/A;   Family History  Problem Relation Age of Onset  . Hypertension Mother   . Heart disease Mother   . Colon cancer Neg Hx   . Rectal cancer Neg Hx   . Stomach cancer Neg Hx    History  Substance Use Topics  . Smoking status: Former Smoker -- 1.00 packs/day for 30 years    Types: Cigarettes    Quit date: 04/16/1977  . Smokeless tobacco: Never Used  . Alcohol Use: Yes     Comment: "quit drinking in 1979"   OB History    No data available     Review of Systems  Respiratory: Positive for cough.   Cardiovascular: Positive for chest pain.  Musculoskeletal: Positive  for back pain.  All other systems reviewed and are negative.     Allergies  Vancomycin; Cefazolin; Codeine; Gentamicin; Gentamycin; Hydrocodone-acetaminophen; Penicillins; Dorzolamide hcl-timolol mal; and Latanoprost  Home Medications   Prior to Admission medications   Medication Sig Start Date End Date Taking? Authorizing Provider  ALPHAGAN P 0.1 % SOLN Place 1 drop into both eyes 2 (two) times daily.  10/11/11  Yes Historical Provider, MD  aspirin EC 81 MG tablet Take by mouth.   Yes Historical Provider, MD  atorvastatin (LIPITOR) 10 MG tablet Take 1 tablet (10 mg total) by mouth daily. 07/22/13  Yes Sueanne Margarita, MD  bimatoprost (LUMIGAN) 0.01 % SOLN Place 1 drop into both eyes at bedtime.  05/21/13  Yes Historical Provider, MD  calcitRIOL (ROCALTROL) 0.25 MCG capsule Take 0.25-0.5 mcg by  mouth daily. Take 0.25 mcg daily alternating with 0.5 mcg 06/12/13  Yes Historical Provider, MD  carvedilol (COREG) 3.125 MG tablet Take 1 tablet (3.125 mg total) by mouth 2 (two) times daily with a meal. 08/26/13  Yes Rhonda G Barrett, PA-C  cephALEXin (KEFLEX) 500 MG capsule Take 500 mg by mouth 2 (two) times daily. 02/02/14  Yes Historical Provider, MD  dextromethorphan-guaiFENesin (ROBITUSSIN-DM) 10-100 MG/5ML liquid Take 5 mLs by mouth every 4 (four) hours as needed for cough.   Yes Historical Provider, MD  furosemide (LASIX) 20 MG tablet Take 20 mg by mouth daily. Excess edema   Yes Historical Provider, MD  isosorbide dinitrate (ISORDIL) 10 MG tablet TAKE 1 TABLET BY MOUTH THREE TIMES DAILY 02/03/14  Yes Sueanne Margarita, MD  labetalol (NORMODYNE) 100 MG tablet Take 100 mg by mouth daily. Can take 1 more tab as needed for hypertension   Yes Historical Provider, MD  LUMIGAN 0.01 % SOLN Place 1 drop into both eyes at bedtime.  10/30/11  Yes Historical Provider, MD  mycophenolate (MYFORTIC) 180 MG EC tablet Take 180 mg by mouth 2 (two) times daily.    Yes Historical Provider, MD  predniSONE (DELTASONE) 5 MG tablet Take 5 mg by mouth. 04/24/12  Yes Historical Provider, MD  RANEXA 500 MG 12 hr tablet TAKE 1 TABLET BY MOUTH TWICE DAILY 11/30/13  Yes Sueanne Margarita, MD  ranitidine (ZANTAC) 150 MG tablet Take 150 mg by mouth daily.    Yes Historical Provider, MD  sodium bicarbonate 650 MG tablet Take 1,300 mg by mouth 2 (two) times daily.  07/13/11  Yes Historical Provider, MD  timolol (BETIMOL) 0.5 % ophthalmic solution Place 1 drop into both eyes 2 (two) times daily.   Yes Historical Provider, MD  timolol (TIMOPTIC) 0.5 % ophthalmic solution Place 1 drop into both eyes 2 (two) times daily.  05/21/13  Yes Historical Provider, MD  traMADol (ULTRAM) 50 MG tablet Take 1 tablet (50 mg total) by mouth every 6 (six) hours as needed for moderate pain. 09/15/13  Yes Billy Fischer, MD  warfarin (COUMADIN) 2.5 MG tablet Take  2.5 mg by mouth daily at 6 PM.  07/10/11  Yes Historical Provider, MD  amLODipine (NORVASC) 5 MG tablet Take 1 tablet (5 mg total) by mouth daily. 09/01/13   Rise Mu, PA-C  cefdinir (OMNICEF) 300 MG capsule  08/20/13   Historical Provider, MD  ciprofloxacin (CIPRO) 250 MG tablet  08/04/13   Historical Provider, MD  doxycycline (VIBRAMYCIN) 100 MG capsule  08/20/13   Historical Provider, MD  enoxaparin (LOVENOX) 80 MG/0.8ML injection  07/06/13   Historical Provider, MD  labetalol (NORMODYNE) 200  MG tablet Take 100 mg by mouth.    Historical Provider, MD  levofloxacin (LEVAQUIN) 500 MG tablet Take 1 tablet (500 mg total) by mouth every other day. 02/04/14   Johnna Acosta, MD  loperamide (IMODIUM) 2 MG capsule Take 1-2 capsules four times a day as needed for diarrhea.  Max is 8 capsules in 24 hours. 07/10/11   Historical Provider, MD  MAGNESIUM-OXIDE 400 (241.3 MG) MG tablet TAKE 1 TABLET BY MOUTH TWICE DAILY 12/29/13   Sueanne Margarita, MD  nitroGLYCERIN (NITROSTAT) 0.4 MG SL tablet Place under the tongue. 08/26/13   Historical Provider, MD  RANEXA 500 MG 12 hr tablet TAKE 1 TABLET BY MOUTH TWICE DAILY 12/29/13   Sueanne Margarita, MD  Specialty Vitamins Products (MAGNESIUM, AMINO ACID CHELATE,) 133 MG tablet Take 1 tablet by mouth. 07/10/11   Historical Provider, MD  tacrolimus (PROGRAF) 1 MG capsule Take 2 mg by mouth 2 (two) times daily.     Historical Provider, MD  tacrolimus (PROGRAF) 1 MG capsule Take 3 mg by mouth. 07/10/11   Historical Provider, MD  warfarin (COUMADIN) 2.5 MG tablet Take 1.25-2.5 mg by mouth every evening. Alternating between 1.25mg  and 2.5mg  daily.    Historical Provider, MD   BP 156/56 mmHg  Pulse 63  Temp(Src) 97.5 F (36.4 C) (Oral)  Resp 15  SpO2 100% Physical Exam  Constitutional: She appears well-developed and well-nourished. No distress.  HENT:  Head: Normocephalic and atraumatic.  Mouth/Throat: Oropharynx is clear and moist. No oropharyngeal exudate.  Eyes: Conjunctivae  and EOM are normal. Pupils are equal, round, and reactive to light. Right eye exhibits no discharge. Left eye exhibits no discharge. No scleral icterus.  Neck: Normal range of motion. Neck supple. No JVD present. No thyromegaly present.  Cardiovascular: Normal rate, regular rhythm, normal heart sounds and intact distal pulses.  Exam reveals no gallop and no friction rub.   No murmur heard. Pulmonary/Chest: Effort normal and breath sounds normal. No respiratory distress. She has no wheezes. She has no rales.  Abdominal: Soft. Bowel sounds are normal. She exhibits no distension and no mass. There is no tenderness.  Musculoskeletal: Normal range of motion. She exhibits no edema or tenderness.  Lymphadenopathy:    She has no cervical adenopathy.  Neurological: She is alert. Coordination normal.  Skin: Skin is warm and dry. No rash noted. No erythema.  Psychiatric: She has a normal mood and affect. Her behavior is normal.  Nursing note and vitals reviewed.   ED Course  Procedures (including critical care time) Labs Review Labs Reviewed  BASIC METABOLIC PANEL - Abnormal; Notable for the following:    Sodium 131 (*)    Glucose, Bld 112 (*)    BUN 31 (*)    Creatinine, Ser 2.99 (*)    GFR calc non Af Amer 15 (*)    GFR calc Af Amer 18 (*)    All other components within normal limits  BRAIN NATRIURETIC PEPTIDE - Abnormal; Notable for the following:    B Natriuretic Peptide 1016.9 (*)    All other components within normal limits  CBC WITH DIFFERENTIAL - Abnormal; Notable for the following:    RBC 3.06 (*)    Hemoglobin 8.2 (*)    HCT 25.2 (*)    RDW 15.9 (*)    All other components within normal limits  Randolm Idol, ED    Imaging Review Dg Chest 2 View  02/04/2014   CLINICAL DATA:  One month history of  intermittent productive cough associated with needed and right posterior chest pain. Current history of hypertension and coronary artery disease. Prior right renal transplant in  2010.  EXAM: CHEST  2 VIEW  COMPARISON:  08/30/2013 dating back to 12/23/2008.  FINDINGS: Cardiac silhouette mildly to moderately enlarged, unchanged. Thoracic aorta mildly tortuous and atherosclerotic, unchanged. Prominent main pulmonary trunk along the upper left mediastinal border, unchanged. Hilar and mediastinal contours otherwise unremarkable. Chronic pleuroparenchymal scarring at the left base with blunting of the costophrenic angle. Prominent bronchovascular markings diffusely and mild central peribronchial thickening, unchanged. Mild pulmonary venous hypertension without overt edema, unchanged. No new pulmonary parenchymal abnormalities. Degenerative changes involving the thoracic spine.  IMPRESSION: Stable cardiomegaly. Stable mild changes of chronic bronchitis and/or asthma. Stable pleuroparenchymal scarring at the left base. No acute cardiopulmonary disease.   Electronically Signed   By: Evangeline Dakin M.D.   On: 02/04/2014 16:04     EKG Interpretation   Date/Time:  Thursday February 04 2014 14:26:47 EST Ventricular Rate:  69 PR Interval:  150 QRS Duration: 96 QT Interval:  418 QTC Calculation: 447 R Axis:   10 Text Interpretation:  Normal sinus rhythm Abnormal QRS-T angle, consider  primary T wave abnormality Abnormal ECG since last tracing no significant  change Confirmed by Lilburn Straw  MD, Lucyann Romano ([id number]) on 02/04/2014 6:09:51 PM      MDM   Final diagnoses:  Bronchitis   No abnormal lung findings, her vital signs are unremarkable, her chest x-ray shows no signs of infiltrate, labs show creatinine of 2.99, close to baseline, no leukocytosis, I discussed the care with the pharmacist who agrees that Levaquin can be dosed every other day, 750 mg now, 500 mg every other day for 3 doses. Will communicate with patient, stable for discharge.    Meds given in ED:  Medications  levofloxacin (LEVAQUIN) tablet 750 mg (750 mg Oral Given 02/04/14 1837)    New Prescriptions   LEVOFLOXACIN  (LEVAQUIN) 500 MG TABLET    Take 1 tablet (500 mg total) by mouth every other day.      Johnna Acosta, MD 02/04/14 [phone]

## 2014-02-04 NOTE — ED Notes (Signed)
Pt sts cough with pain with cough in chest and back x several weeks; pt sts productive cough with green and clear sputum; pt sts taking antibiotics without relief

## 2014-02-05 ENCOUNTER — Encounter (HOSPITAL_COMMUNITY)
Admission: RE | Admit: 2014-02-05 | Discharge: 2014-02-05 | Disposition: A | Discharge status: Home / Self Care | Payer: Medicare Other | Source: Ambulatory Visit | Attending: Nephrology | Admitting: Nephrology

## 2014-02-05 DIAGNOSIS — Z79899 Other long term (current) drug therapy: Secondary | ICD-10-CM | POA: Diagnosis not present

## 2014-02-05 DIAGNOSIS — Z5181 Encounter for therapeutic drug level monitoring: Secondary | ICD-10-CM | POA: Diagnosis not present

## 2014-02-05 DIAGNOSIS — N184 Chronic kidney disease, stage 4 (severe): Secondary | ICD-10-CM | POA: Insufficient documentation

## 2014-02-05 DIAGNOSIS — D631 Anemia in chronic kidney disease: Secondary | ICD-10-CM | POA: Insufficient documentation

## 2014-02-05 LAB — POCT HEMOGLOBIN-HEMACUE: Hemoglobin: 8.5 g/dL — ABNORMAL LOW (ref 12.0–15.0)

## 2014-02-05 MED ORDER — SODIUM CHLORIDE 0.9 % IV SOLN
510.0000 mg | Freq: Once | INTRAVENOUS | Status: DC
  Filled 2014-02-05: qty 17

## 2014-02-05 MED ORDER — EPOETIN ALFA 10000 UNIT/ML IJ SOLN: 20000.0000 [IU] | INTRAMUSCULAR | Status: DC

## 2014-02-05 MED ORDER — EPOETIN ALFA 20000 UNIT/ML IJ SOLN
INTRAMUSCULAR | Status: AC
Start: 2014-02-05 — End: 2014-02-05
  Administered 2014-02-05: 20000 [IU] via SUBCUTANEOUS
  Filled 2014-02-05: qty 1

## 2014-02-05 NOTE — Telephone Encounter (Signed)
Dr. Deterding's nurse, Peter Congo, states that it is not in their office notes that the patient was on carvedilol when the doctor restarted Labetalol. Peter Congo is to send over Deterding's OV for review.

## 2014-02-05 NOTE — Progress Notes (Signed)
Unable to obtain IV access x 3 attempts by IV therapy and one by myself. Patient wishes to leave and return another day. [facility] at [facility] advised. Patient states she cannot return for 2 weeks.  Will reschedule

## 2014-02-05 NOTE — Telephone Encounter (Signed)
Please have Dr. Deterding's nurse find out which beta blocker he wants her on

## 2014-02-05 NOTE — Telephone Encounter (Signed)
Left message for Dr. Deterding's nurse to check on patient's medications and to see if Dr. Jimmy Footman wants her to continue both labetalol and coreg. Left my number to call directly to follow-up.

## 2014-02-09 NOTE — Addendum Note (Signed)
Addended by: Harland German A on: 02/09/2014 10:32 AM   Modules accepted: Orders, Medications

## 2014-02-09 NOTE — Telephone Encounter (Signed)
Debbie [name] st that per Dr. Jimmy Footman, pt was advised to [facility].   Med list updated.

## 2014-02-18 ENCOUNTER — Other Ambulatory Visit (HOSPITAL_COMMUNITY): Payer: Self-pay | Admitting: *Deleted

## 2014-02-19 ENCOUNTER — Inpatient Hospital Stay (HOSPITAL_COMMUNITY): Admission: RE | Admit: 2014-02-19 | Payer: Medicare Other | Source: Ambulatory Visit

## 2014-03-04 ENCOUNTER — Other Ambulatory Visit: Payer: Self-pay | Admitting: Cardiology

## 2014-04-02 ENCOUNTER — Other Ambulatory Visit: Payer: Self-pay | Admitting: Cardiology

## 2014-04-08 ENCOUNTER — Emergency Department (HOSPITAL_COMMUNITY): Payer: Medicare Other

## 2014-04-08 ENCOUNTER — Emergency Department (HOSPITAL_COMMUNITY)
Admission: EM | Admit: 2014-04-08 | Discharge: 2014-04-08 | Disposition: A | Discharge status: Home / Self Care | Payer: Medicare Other | Attending: Emergency Medicine | Admitting: Emergency Medicine

## 2014-04-08 ENCOUNTER — Encounter (HOSPITAL_COMMUNITY): Payer: Self-pay | Admitting: Emergency Medicine

## 2014-04-08 DIAGNOSIS — I2510 Atherosclerotic heart disease of native coronary artery without angina pectoris: Secondary | ICD-10-CM | POA: Diagnosis not present

## 2014-04-08 DIAGNOSIS — M1990 Unspecified osteoarthritis, unspecified site: Secondary | ICD-10-CM | POA: Diagnosis not present

## 2014-04-08 DIAGNOSIS — M25511 Pain in right shoulder: Secondary | ICD-10-CM | POA: Diagnosis present

## 2014-04-08 DIAGNOSIS — Z87891 Personal history of nicotine dependence: Secondary | ICD-10-CM | POA: Diagnosis not present

## 2014-04-08 DIAGNOSIS — Z79899 Other long term (current) drug therapy: Secondary | ICD-10-CM | POA: Diagnosis not present

## 2014-04-08 DIAGNOSIS — I120 Hypertensive chronic kidney disease with stage 5 chronic kidney disease or end stage renal disease: Secondary | ICD-10-CM | POA: Diagnosis not present

## 2014-04-08 DIAGNOSIS — Z940 Kidney transplant status: Secondary | ICD-10-CM | POA: Diagnosis not present

## 2014-04-08 DIAGNOSIS — R252 Cramp and spasm: Secondary | ICD-10-CM | POA: Diagnosis not present

## 2014-04-08 DIAGNOSIS — Z7901 Long term (current) use of anticoagulants: Secondary | ICD-10-CM | POA: Diagnosis not present

## 2014-04-08 DIAGNOSIS — I480 Paroxysmal atrial fibrillation: Secondary | ICD-10-CM | POA: Insufficient documentation

## 2014-04-08 DIAGNOSIS — Z862 Personal history of diseases of the blood and blood-forming organs and certain disorders involving the immune mechanism: Secondary | ICD-10-CM | POA: Diagnosis not present

## 2014-04-08 DIAGNOSIS — Z7982 Long term (current) use of aspirin: Secondary | ICD-10-CM | POA: Insufficient documentation

## 2014-04-08 DIAGNOSIS — Z8701 Personal history of pneumonia (recurrent): Secondary | ICD-10-CM | POA: Diagnosis not present

## 2014-04-08 DIAGNOSIS — Z86718 Personal history of other venous thrombosis and embolism: Secondary | ICD-10-CM | POA: Insufficient documentation

## 2014-04-08 DIAGNOSIS — R011 Cardiac murmur, unspecified: Secondary | ICD-10-CM | POA: Insufficient documentation

## 2014-04-08 DIAGNOSIS — Z880 Allergy status to penicillin: Secondary | ICD-10-CM | POA: Diagnosis not present

## 2014-04-08 DIAGNOSIS — K219 Gastro-esophageal reflux disease without esophagitis: Secondary | ICD-10-CM | POA: Diagnosis not present

## 2014-04-08 DIAGNOSIS — E785 Hyperlipidemia, unspecified: Secondary | ICD-10-CM | POA: Diagnosis not present

## 2014-04-08 DIAGNOSIS — Z86010 Personal history of colonic polyps: Secondary | ICD-10-CM | POA: Diagnosis not present

## 2014-04-08 DIAGNOSIS — Z992 Dependence on renal dialysis: Secondary | ICD-10-CM | POA: Insufficient documentation

## 2014-04-08 DIAGNOSIS — N186 End stage renal disease: Secondary | ICD-10-CM | POA: Insufficient documentation

## 2014-04-08 DIAGNOSIS — H409 Unspecified glaucoma: Secondary | ICD-10-CM | POA: Insufficient documentation

## 2014-04-08 DIAGNOSIS — M542 Cervicalgia: Secondary | ICD-10-CM | POA: Insufficient documentation

## 2014-04-08 DIAGNOSIS — Z7952 Long term (current) use of systemic steroids: Secondary | ICD-10-CM | POA: Diagnosis not present

## 2014-04-08 LAB — CBC
HEMATOCRIT: 29.1 % — AB (ref 36.0–46.0)
HEMOGLOBIN: 9.3 g/dL — AB (ref 12.0–15.0)
MCH: 25.6 pg — ABNORMAL LOW (ref 26.0–34.0)
MCHC: 32 g/dL (ref 30.0–36.0)
MCV: 80.2 fL (ref 78.0–100.0)
PLATELETS: 162 10*3/uL (ref 150–400)
RBC: 3.63 MIL/uL — AB (ref 3.87–5.11)
RDW: 16.3 % — ABNORMAL HIGH (ref 11.5–15.5)
WBC: 7.3 10*3/uL (ref 4.0–10.5)

## 2014-04-08 LAB — BASIC METABOLIC PANEL
Anion gap: 9 (ref 5–15)
BUN: 32 mg/dL — ABNORMAL HIGH (ref 6–23)
CO2: 25 mmol/L (ref 19–32)
Calcium: 9.6 mg/dL (ref 8.4–10.5)
Chloride: 101 mmol/L (ref 96–112)
Creatinine, Ser: 2.21 mg/dL — ABNORMAL HIGH (ref 0.50–1.10)
GFR calc Af Amer: 26 mL/min — ABNORMAL LOW (ref >90)
GFR, EST NON AFRICAN AMERICAN: 22 mL/min — AB (ref >90)
GLUCOSE: 104 mg/dL — AB (ref 70–99)
Potassium: 4.4 mmol/L (ref 3.5–5.1)
SODIUM: 135 mmol/L (ref 135–145)

## 2014-04-08 LAB — TROPONIN I

## 2014-04-08 MED ORDER — OXYCODONE-ACETAMINOPHEN 5-325 MG PO TABS
1.0000 | ORAL_TABLET | Freq: Once | ORAL | Status: AC
Start: 2014-04-08 — End: 2014-04-08
  Administered 2014-04-08: 1 via ORAL

## 2014-04-08 MED ORDER — TRAMADOL HCL 50 MG PO TABS
50.0000 mg | ORAL_TABLET | Freq: Once | ORAL | Status: AC
  Administered 2014-04-08: 50 mg via ORAL
  Filled 2014-04-08: qty 1

## 2014-04-08 MED ORDER — TRAMADOL HCL 50 MG PO TABS: 50.0000 mg | ORAL_TABLET | Freq: Four times a day (QID) | ORAL | Status: DC | PRN

## 2014-04-08 MED ORDER — OXYCODONE-ACETAMINOPHEN 5-325 MG PO TABS
ORAL_TABLET | ORAL | Status: AC
  Filled 2014-04-08: qty 1

## 2014-04-08 NOTE — ED Notes (Signed)
Pt sts neck pain into back and right arm x 3 days; pt denies obvious injury

## 2014-04-08 NOTE — Discharge Instructions (Signed)
Please follow up with your primary care physician in 1-2 days. If you do not have one please call the [facility] number listed above. Please take pain medication and/or muscle relaxants as prescribed and as needed for pain. Please do not drive on narcotic pain medication or on muscle relaxants. Please read all discharge instructions and return precautions.   Muscle Cramps and Spasms Muscle cramps and spasms occur when a muscle or muscles tighten and you have no control over this tightening (involuntary muscle contraction). They are a common problem and can develop in any muscle. The most common place is in the calf muscles of the leg. Both muscle cramps and muscle spasms are involuntary muscle contractions, but they also have differences:   Muscle cramps are sporadic and painful. They may last a few seconds to a quarter of an hour. Muscle cramps are often more forceful and last longer than muscle spasms.  Muscle spasms may or may not be painful. They may also last just a few seconds or much longer. CAUSES  It is uncommon for cramps or spasms to be due to a serious underlying problem. In many cases, the cause of cramps or spasms is unknown. Some common causes are:   Overexertion.   Overuse from repetitive motions (doing the same thing over and over).   Remaining in a certain position for a long period of time.   Improper preparation, form, or technique while performing a sport or activity.   Dehydration.   Injury.   Side effects of some medicines.   Abnormally low levels of the salts and ions in your blood (electrolytes), especially potassium and calcium. This could happen if you are taking water pills (diuretics) or you are pregnant.  Some underlying medical problems can make it more likely to develop cramps or spasms. These include, but are not limited to:   Diabetes.   Parkinson disease.   Hormone disorders, such as thyroid problems.   Alcohol  abuse.   Diseases specific to muscles, joints, and bones.   Blood vessel disease where not enough blood is getting to the muscles.  HOME CARE INSTRUCTIONS   Stay well hydrated. Drink enough water and fluids to keep your urine clear or pale yellow.  It may be helpful to massage, stretch, and relax the affected muscle.  For tight or tense muscles, use a warm towel, heating pad, or hot shower water directed to the affected area.  If you are sore or have pain after a cramp or spasm, applying ice to the affected area may relieve discomfort.  Put ice in a plastic bag.  Place a towel between your skin and the bag.  Leave the ice on for 15-20 minutes, 03-04 times a day.  Medicines used to treat a known cause of cramps or spasms may help reduce their frequency or severity. Only take over-the-counter or prescription medicines as directed by your caregiver. SEEK MEDICAL CARE IF:  Your cramps or spasms get more severe, more frequent, or do not improve over time.  MAKE SURE YOU:   Understand these instructions.  Will watch your condition.  Will get help right away if you are not doing well or get worse. Document Released: 06/30/2001 Document Revised: 05/05/2012 Document Reviewed: 12/26/2011 [facility] Patient Information 2015 [place], [place]. This information is not intended to replace advice given to you by your health care provider. Make sure you discuss any questions you have with your health care provider.

## 2014-04-08 NOTE — ED Notes (Signed)
Pt being transported to radiology department at this time

## 2014-04-08 NOTE — ED Provider Notes (Signed)
CSN: [id number]     Arrival date & time 04/08/14  1026 History   First MD Initiated Contact with Patient 04/08/14 1242     Chief Complaint  Patient presents with  . Arm Pain  . Neck Pain     (Consider location/radiation/quality/duration/timing/severity/associated sxs/prior Treatment) HPI Comments: Patient is a 64 yo F PMHx significant for CAD, HLD, HTN, Renal disorder s/p transplant, GERD presenting to the ED for evaluation of right shoulder pain with radiation to neck, back, and chest. Patient describes her pain as sharp constant pain. No known injury or fall. Patient has taken tylenol with some improvement of her pain. She states she had similar pain back in January and was diagnosed with "walking pneumonia." Denies any SOB, abdominal pain, nausea, vomiting, diarrhea, numbness in her extremities, HA, syncope. Reviewed EMR, hx of Echo without CHF in last year and stress without ischemia in last 8 months.    Past Medical History  Diagnosis Date  . Kidney transplant as cause of abnormal reaction or later complication   . Hypertension   . Colon polyps   . Gall stones 1980  . Anti-phospholipid antibody syndrome     (Based on hospital discharge summary march 2013)  . Atrial fibrillation   . Carpal tunnel syndrome 2003    lt  . Esophageal stricture   . Hemorrhoids   . Renal disorder   . ESRD (end stage renal disease) on dialysis     "til I got my transplant in 2010"  . DVT (deep venous thrombosis) 1990    LLE  . History of blood transfusion     "related to the lupus"  . Dyslipidemia 07/22/2013  . Coronary artery disease   . Heart murmur   . Pneumonia     "3 times now" (08/25/2013)  . GERD (gastroesophageal reflux disease)   . Arthritis     "fingers" (08/25/2013)  . Glaucoma, left eye    Past Surgical History  Procedure Laterality Date  . Nephrectomy transplanted organ  05/2008  . Cholecystectomy  1980  . Hematoma evacuation  1998    "after they took peritoneal catheter out"  .  Peritoneal catheter insertion    . Peritoneal catheter removal    . Carpal tunnel release Right 07/2001  . Arteriovenous graft placement    . Thrombectomy and revision of arterioventous (av) goretex  graft Right 07/1999; 04/2002; 09/2002; 12/2005; 09/2007;     upper arm/notes 07/31/1999; 05/18/2002; 10/07/2002; 01/14/2006; 09/14/2007;   . Right heart catheterization N/A 08/28/2013    Procedure: RIGHT HEART CATH;  Surgeon: Sinclair Grooms, MD;  Location: [facility] CATH LAB;  Service: Cardiovascular;  Laterality: N/A;   Family History  Problem Relation Age of Onset  . Hypertension Mother   . Heart disease Mother   . Colon cancer Neg Hx   . Rectal cancer Neg Hx   . Stomach cancer Neg Hx    History  Substance Use Topics  . Smoking status: Former Smoker -- 1.00 packs/day for 30 years    Types: Cigarettes    Quit date: 04/16/1977  . Smokeless tobacco: Never Used  . Alcohol Use: Yes     Comment: "quit drinking in 1979"   OB History    No data available     Review of Systems  Musculoskeletal: Positive for myalgias, arthralgias and neck pain.  All other systems reviewed and are negative.     Allergies  Vancomycin; Dorzolamide hcl-timolol mal; Gentamycin; Latanoprost; Cefazolin; Codeine; Hydrocodone-acetaminophen; and Penicillins  Home Medications   Prior to Admission medications   Medication Sig Start Date End Date Taking? Authorizing Provider  aspirin EC 81 MG tablet Take 81 mg by mouth every evening.    Yes Historical Provider, MD  atorvastatin (LIPITOR) 10 MG tablet Take 1 tablet (10 mg total) by mouth daily. Patient taking differently: Take 10 mg by mouth daily at 6 PM.  07/22/13  Yes Traci R Turner, MD  bimatoprost (LUMIGAN) 0.01 % SOLN Place 1 drop into both eyes at bedtime.  05/21/13  Yes Historical Provider, MD  calcitRIOL (ROCALTROL) 0.25 MCG capsule Take 0.25-0.5 mcg by mouth daily. Take 0.25 mcg daily alternating with 0.5 mcg 06/12/13  Yes Historical Provider, MD  furosemide (LASIX) 20  MG tablet Take 20 mg by mouth daily. Excess edema   Yes Historical Provider, MD  isosorbide dinitrate (ISORDIL) 10 MG tablet TAKE 1 TABLET BY MOUTH THREE TIMES DAILY 03/04/14  Yes Doreene Burke Kilroy, PA-C  labetalol (NORMODYNE) 200 MG tablet Take 100 mg by mouth 2 (two) times daily.    Yes Historical Provider, MD  MAGNESIUM-OXIDE 400 (241.3 MG) MG tablet TAKE 1 TABLET BY MOUTH TWICE DAILY 12/29/13  Yes Sueanne Margarita, MD  mycophenolate (MYFORTIC) 180 MG EC tablet Take 180 mg by mouth 2 (two) times daily.    Yes Historical Provider, MD  predniSONE (DELTASONE) 5 MG tablet Take 5 mg by mouth daily with breakfast.  04/24/12  Yes Historical Provider, MD  RANEXA 500 MG 12 hr tablet TAKE 1 TABLET BY MOUTH TWICE DAILY 11/30/13  Yes Sueanne Margarita, MD  ranitidine (ZANTAC) 150 MG tablet Take 150 mg by mouth daily.    Yes Historical Provider, MD  sodium bicarbonate 650 MG tablet Take 1,300 mg by mouth 2 (two) times daily.  07/13/11  Yes Historical Provider, MD  tacrolimus (PROGRAF) 1 MG capsule Take 2 mg by mouth 2 (two) times daily.    Yes Historical Provider, MD  timolol (TIMOPTIC) 0.5 % ophthalmic solution Place 1 drop into both eyes 2 (two) times daily.  05/21/13  Yes Historical Provider, MD  traMADol (ULTRAM) 50 MG tablet Take 1 tablet (50 mg total) by mouth every 6 (six) hours as needed for moderate pain. 09/15/13  Yes Billy Fischer, MD  warfarin (COUMADIN) 2.5 MG tablet Take 2.5 mg by mouth daily at 6 PM.  07/10/11  Yes Historical Provider, MD  amLODipine (NORVASC) 5 MG tablet Take 1 tablet (5 mg total) by mouth daily. 09/01/13   Rise Mu, PA-C  nitroGLYCERIN (NITROSTAT) 0.4 MG SL tablet Place under the tongue. 08/26/13   Historical Provider, MD  RANEXA 500 MG 12 hr tablet TAKE 1 TABLET BY MOUTH TWICE DAILY 12/29/13   Sueanne Margarita, MD  traMADol (ULTRAM) 50 MG tablet Take 1 tablet (50 mg total) by mouth every 6 (six) hours as needed. 04/08/14   Smt. Loder, PA-C   BP 144/57 mmHg  Pulse 65  Temp(Src) 98.1  F (36.7 C) (Oral)  Resp 24  SpO2 100% Physical Exam  Constitutional: She is oriented to person, place, and time. She appears well-developed and well-nourished. No distress.  HENT:  Head: Normocephalic and atraumatic.  Right Ear: External ear normal.  Left Ear: External ear normal.  Nose: Nose normal.  Mouth/Throat: Oropharynx is clear and moist.  Eyes: Conjunctivae are normal.  Neck: Normal range of motion. Neck supple. Muscular tenderness present. No spinous process tenderness present.    Cardiovascular: Normal rate, regular rhythm, normal heart sounds  and intact distal pulses.   Pulmonary/Chest: Effort normal and breath sounds normal. No respiratory distress. She exhibits tenderness.  Abdominal: Soft. There is no tenderness.  Musculoskeletal: Normal range of motion. She exhibits tenderness.       Right shoulder: She exhibits tenderness, pain and spasm.       Thoracic back: Normal.       Lumbar back: Normal.       Back:       Arms: Neurological: She is alert and oriented to person, place, and time. She has normal strength. No cranial nerve deficit or sensory deficit. GCS eye subscore is 4. GCS verbal subscore is 5. GCS motor subscore is 6.  Skin: Skin is warm and dry. She is not diaphoretic.  Psychiatric: She has a normal mood and affect.  Nursing note and vitals reviewed.   ED Course  Procedures (including critical care time) Medications  oxyCODONE-acetaminophen (PERCOCET/ROXICET) 5-325 MG per tablet 1 tablet (1 tablet Oral Given 04/08/14 1058)  traMADol (ULTRAM) tablet 50 mg (50 mg Oral Given 04/08/14 1326)    Labs Review Labs Reviewed  BASIC METABOLIC PANEL - Abnormal; Notable for the following:    Glucose, Bld 104 (*)    BUN 32 (*)    Creatinine, Ser 2.21 (*)    GFR calc non Af Amer 22 (*)    GFR calc Af Amer 26 (*)    All other components within normal limits  CBC - Abnormal; Notable for the following:    RBC 3.63 (*)    Hemoglobin 9.3 (*)    HCT 29.1 (*)     MCH 25.6 (*)    RDW 16.3 (*)    All other components within normal limits  TROPONIN I    Imaging Review Dg Chest 2 View  04/08/2014   CLINICAL DATA:  Arm and neck pain  EXAM: CHEST  2 VIEW  COMPARISON:  02/04/2014  FINDINGS: Stable interstitial coarsening with haziness and volume loss of the left chest -correlating with anterior pleural based opacity noted on chest CT 08/27/2013.  There is no edema, consolidation, effusion, or pneumothorax.  Mild cardiomegaly which is stable. Negative aortic and hilar contours.  Bilateral arm AV graft.  Cholecystectomy.  IMPRESSION: Stable exam.  No active cardiopulmonary disease.   Electronically Signed   By: Monte Fantasia M.D.   On: 04/08/2014 14:43   Dg Shoulder Right  04/08/2014   CLINICAL DATA:  Pain in the right shoulder going to the chest, onset Tuesday. No known injury.  EXAM: RIGHT SHOULDER - 2+ VIEW  COMPARISON:  None.  FINDINGS: There is no fracture, dislocation, or advanced arthritis. No focal bone lesion. Mild rotator cuff insertional change to the greater tuberosity.  Vascular calcification with neighboring clips in the right upper arm related to AV graft history.  IMPRESSION: No explanation for acute shoulder pain.   Electronically Signed   By: Monte Fantasia M.D.   On: 04/08/2014 14:41     EKG Interpretation None      MDM   Final diagnoses:  Right shoulder pain    Filed Vitals:   04/08/14 1617  BP: 144/57  Pulse:   Temp:   Resp:    Afebrile, NAD, non-toxic appearing, AAOx4.  Neurovascularly intact. Normal sensation. No evidence of compartment syndrome. Patient with reproducible right sided neck and scapular pain. Chest pain is likely chest wall pain from radiation from neck and shoulder area. Troponin negative. EKG unremarkable. CXR unremarkable. Pain is likely musculoskeletal in nature. Pain  improved in ED. return precautions discussed. Patient is agreeable to plan and discharged home. Patient is stable at time of  discharge Patient d/w with Dr. Johnney Killian, agrees with plan.     Baron Sane, PA-C 04/09/14 [place], MD 04/10/14 [phone]

## 2014-04-12 ENCOUNTER — Encounter: Payer: Self-pay | Admitting: Cardiology

## 2014-04-12 NOTE — Progress Notes (Signed)
This encounter was created in error - please disregard.  This encounter was created in error - please disregard.

## 2014-04-13 ENCOUNTER — Encounter: Payer: Medicare Other | Admitting: Cardiology

## 2014-04-19 ENCOUNTER — Emergency Department (HOSPITAL_COMMUNITY)
Admission: EM | Admit: 2014-04-19 | Discharge: 2014-04-19 | Disposition: A | Discharge status: Home / Self Care | Payer: Medicare Other | Attending: Emergency Medicine | Admitting: Emergency Medicine

## 2014-04-19 ENCOUNTER — Encounter (HOSPITAL_COMMUNITY): Payer: Self-pay

## 2014-04-19 ENCOUNTER — Emergency Department (HOSPITAL_COMMUNITY): Payer: Medicare Other

## 2014-04-19 DIAGNOSIS — Z7901 Long term (current) use of anticoagulants: Secondary | ICD-10-CM | POA: Insufficient documentation

## 2014-04-19 DIAGNOSIS — Z79899 Other long term (current) drug therapy: Secondary | ICD-10-CM | POA: Diagnosis not present

## 2014-04-19 DIAGNOSIS — E785 Hyperlipidemia, unspecified: Secondary | ICD-10-CM | POA: Diagnosis not present

## 2014-04-19 DIAGNOSIS — N186 End stage renal disease: Secondary | ICD-10-CM | POA: Insufficient documentation

## 2014-04-19 DIAGNOSIS — R011 Cardiac murmur, unspecified: Secondary | ICD-10-CM | POA: Insufficient documentation

## 2014-04-19 DIAGNOSIS — Z8701 Personal history of pneumonia (recurrent): Secondary | ICD-10-CM | POA: Insufficient documentation

## 2014-04-19 DIAGNOSIS — Z87891 Personal history of nicotine dependence: Secondary | ICD-10-CM | POA: Insufficient documentation

## 2014-04-19 DIAGNOSIS — Z86718 Personal history of other venous thrombosis and embolism: Secondary | ICD-10-CM | POA: Diagnosis not present

## 2014-04-19 DIAGNOSIS — Z7952 Long term (current) use of systemic steroids: Secondary | ICD-10-CM | POA: Insufficient documentation

## 2014-04-19 DIAGNOSIS — I480 Paroxysmal atrial fibrillation: Secondary | ICD-10-CM | POA: Insufficient documentation

## 2014-04-19 DIAGNOSIS — I2510 Atherosclerotic heart disease of native coronary artery without angina pectoris: Secondary | ICD-10-CM | POA: Diagnosis not present

## 2014-04-19 DIAGNOSIS — Z87828 Personal history of other (healed) physical injury and trauma: Secondary | ICD-10-CM | POA: Insufficient documentation

## 2014-04-19 DIAGNOSIS — Z7982 Long term (current) use of aspirin: Secondary | ICD-10-CM | POA: Diagnosis not present

## 2014-04-19 DIAGNOSIS — Z880 Allergy status to penicillin: Secondary | ICD-10-CM | POA: Diagnosis not present

## 2014-04-19 DIAGNOSIS — K219 Gastro-esophageal reflux disease without esophagitis: Secondary | ICD-10-CM | POA: Diagnosis not present

## 2014-04-19 DIAGNOSIS — I120 Hypertensive chronic kidney disease with stage 5 chronic kidney disease or end stage renal disease: Secondary | ICD-10-CM | POA: Diagnosis not present

## 2014-04-19 DIAGNOSIS — H409 Unspecified glaucoma: Secondary | ICD-10-CM | POA: Diagnosis not present

## 2014-04-19 DIAGNOSIS — M1712 Unilateral primary osteoarthritis, left knee: Secondary | ICD-10-CM | POA: Insufficient documentation

## 2014-04-19 DIAGNOSIS — Z940 Kidney transplant status: Secondary | ICD-10-CM | POA: Insufficient documentation

## 2014-04-19 DIAGNOSIS — Z86010 Personal history of colonic polyps: Secondary | ICD-10-CM | POA: Diagnosis not present

## 2014-04-19 DIAGNOSIS — M25462 Effusion, left knee: Secondary | ICD-10-CM | POA: Diagnosis not present

## 2014-04-19 DIAGNOSIS — Z8669 Personal history of other diseases of the nervous system and sense organs: Secondary | ICD-10-CM | POA: Diagnosis not present

## 2014-04-19 DIAGNOSIS — Z9889 Other specified postprocedural states: Secondary | ICD-10-CM | POA: Diagnosis not present

## 2014-04-19 DIAGNOSIS — Z992 Dependence on renal dialysis: Secondary | ICD-10-CM | POA: Diagnosis not present

## 2014-04-19 DIAGNOSIS — M25562 Pain in left knee: Secondary | ICD-10-CM

## 2014-04-19 LAB — BASIC METABOLIC PANEL
Anion gap: 12 (ref 5–15)
BUN: 33 mg/dL — AB (ref 6–23)
CO2: 24 mmol/L (ref 19–32)
Calcium: 9.8 mg/dL (ref 8.4–10.5)
Chloride: 100 mmol/L (ref 96–112)
Creatinine, Ser: 2.27 mg/dL — ABNORMAL HIGH (ref 0.50–1.10)
GFR calc Af Amer: 25 mL/min — ABNORMAL LOW (ref >90)
GFR, EST NON AFRICAN AMERICAN: 22 mL/min — AB (ref >90)
Glucose, Bld: 107 mg/dL — ABNORMAL HIGH (ref 70–99)
Potassium: 4.4 mmol/L (ref 3.5–5.1)
SODIUM: 136 mmol/L (ref 135–145)

## 2014-04-19 LAB — CBC
HEMATOCRIT: 31.5 % — AB (ref 36.0–46.0)
Hemoglobin: 10 g/dL — ABNORMAL LOW (ref 12.0–15.0)
MCH: 25.3 pg — AB (ref 26.0–34.0)
MCHC: 31.7 g/dL (ref 30.0–36.0)
MCV: 79.7 fL (ref 78.0–100.0)
PLATELETS: 246 10*3/uL (ref 150–400)
RBC: 3.95 MIL/uL (ref 3.87–5.11)
RDW: 15.7 % — AB (ref 11.5–15.5)
WBC: 7.3 10*3/uL (ref 4.0–10.5)

## 2014-04-19 MED ORDER — TRAMADOL HCL 50 MG PO TABS
50.0000 mg | ORAL_TABLET | Freq: Once | ORAL | Status: AC
  Administered 2014-04-19: 50 mg via ORAL
  Filled 2014-04-19: qty 1

## 2014-04-19 NOTE — ED Notes (Signed)
Per EMS, Pt, from home, c/o generalized body aches (knees, back, shoulders) x 2 weeks.  Denies injury. Pt has been seen at [facility] and Nephrologist for same and prescribed pain medication.  Pt reports that medications are not resolving pain.

## 2014-04-19 NOTE — ED Notes (Signed)
Patient states she is having bilateral posterior shoulder pain that started about 3 weeks ago. No bruising or swelling noted. Tenderness noted to shoulder. Pt reports the new complaint is posterior left knee pain and swelling. Tender to touch. Able to move toes. Denies any numbness, tingling, or recent injury. Swelling noted to posterior knee. Pt has a knee brace to the left knee. Pulses +2 noted to left foot.

## 2014-04-19 NOTE — ED Provider Notes (Signed)
CSN: [id number]     Arrival date & time 04/19/14  1614 History   First MD Initiated Contact with Patient 04/19/14 1935     Chief Complaint  Patient presents with  . Generalized Body Aches     (Consider location/radiation/quality/duration/timing/severity/associated sxs/prior Treatment) Patient is a 64 y.o. female presenting with knee pain.  Knee Pain Location:  Knee Injury: yes   Mechanism of injury comment:  Stumbled yesterday when coming out of church.  did not fall.  Knee location:  L knee Pain details:    Quality:  Throbbing   Radiates to:  Does not radiate   Severity:  Severe   Onset quality:  Gradual   Duration:  1 day   Timing:  Constant   Progression:  Worsening Chronicity:  Recurrent (similar symptoms several months ago) Relieved by:  Nothing Worsened by:  Bearing weight Ineffective treatments:  Compression (tramadol) Associated symptoms: swelling   Associated symptoms: no fever, no muscle weakness and no numbness     Past Medical History  Diagnosis Date  . Kidney transplant as cause of abnormal reaction or later complication   . Hypertension   . Colon polyps   . Gall stones 1980  . Anti-phospholipid antibody syndrome     (Based on hospital discharge summary march 2013)  . Paroxysmal atrial fibrillation   . Carpal tunnel syndrome 2003    lt  . Esophageal stricture   . Hemorrhoids   . Renal disorder   . ESRD (end stage renal disease) on dialysis     "til I got my transplant in 2010"  . DVT (deep venous thrombosis) 1990    LLE  . History of blood transfusion     "related to the lupus"  . Dyslipidemia 07/22/2013  . Coronary artery disease   . Heart murmur     mild MR on echo  . Pneumonia     "3 times now" (08/25/2013)  . GERD (gastroesophageal reflux disease)   . Arthritis     "fingers" (08/25/2013)  . Glaucoma, left eye   . Bilateral carotid artery stenosis     1-39%  . Pulmonary HTN     PASP 72mmHg   Past Surgical History  Procedure Laterality Date   . Nephrectomy transplanted organ  05/2008  . Cholecystectomy  1980  . Hematoma evacuation  1998    "after they took peritoneal catheter out"  . Peritoneal catheter insertion    . Peritoneal catheter removal    . Carpal tunnel release Right 07/2001  . Arteriovenous graft placement    . Thrombectomy and revision of arterioventous (av) goretex  graft Right 07/1999; 04/2002; 09/2002; 12/2005; 09/2007;     upper arm/notes 07/31/1999; 05/18/2002; 10/07/2002; 01/14/2006; 09/14/2007;   . Right heart catheterization N/A 08/28/2013    Procedure: RIGHT HEART CATH;  Surgeon: Sinclair Grooms, MD;  Location: [facility] CATH LAB;  Service: Cardiovascular;  Laterality: N/A;   Family History  Problem Relation Age of Onset  . Hypertension Mother   . Heart disease Mother   . Colon cancer Neg Hx   . Rectal cancer Neg Hx   . Stomach cancer Neg Hx    History  Substance Use Topics  . Smoking status: Former Smoker -- 1.00 packs/day for 30 years    Types: Cigarettes    Quit date: 04/16/1977  . Smokeless tobacco: Never Used  . Alcohol Use: Yes     Comment: "quit drinking in 1979"   OB History    No data  available     Review of Systems  Constitutional: Negative for fever.  All other systems reviewed and are negative.     Allergies  Vancomycin; Dorzolamide hcl-timolol mal; Gentamycin; Latanoprost; Cefazolin; Codeine; Hydrocodone-acetaminophen; and Penicillins  Home Medications   Prior to Admission medications   Medication Sig Start Date End Date Taking? Authorizing Provider  acetaminophen (TYLENOL) 500 MG tablet Take 1,000 mg by mouth every 6 (six) hours as needed for moderate pain (pain).   Yes Historical Provider, MD  amLODipine (NORVASC) 5 MG tablet Take 1 tablet (5 mg total) by mouth daily. 09/01/13  Yes Ryan M Dunn, PA-C  aspirin EC 81 MG tablet Take 81 mg by mouth every evening.    Yes Historical Provider, MD  atorvastatin (LIPITOR) 10 MG tablet Take 1 tablet (10 mg total) by mouth daily. 07/22/13  Yes  Sueanne Margarita, MD  bimatoprost (LUMIGAN) 0.01 % SOLN Place 1 drop into both eyes at bedtime.  05/21/13  Yes Historical Provider, MD  calcitRIOL (ROCALTROL) 0.25 MCG capsule Take 0.25-0.5 mcg by mouth daily. Take 0.25 mcg daily alternating with 0.5 mcg 06/12/13  Yes Historical Provider, MD  cyclobenzaprine (FLEXERIL) 10 MG tablet Take 10 mg by mouth 3 (three) times daily as needed for muscle spasms (muscle spasms).   Yes Historical Provider, MD  furosemide (LASIX) 20 MG tablet Take 20 mg by mouth daily. Excess edema   Yes Historical Provider, MD  isosorbide dinitrate (ISORDIL) 10 MG tablet TAKE 1 TABLET BY MOUTH THREE TIMES DAILY 03/04/14  Yes Doreene Burke Kilroy, PA-C  labetalol (NORMODYNE) 200 MG tablet Take 100 mg by mouth 2 (two) times daily.    Yes Historical Provider, MD  mycophenolate (MYFORTIC) 180 MG EC tablet Take 180 mg by mouth 2 (two) times daily.    Yes Historical Provider, MD  predniSONE (DELTASONE) 5 MG tablet Take 5 mg by mouth daily with breakfast.  04/24/12  Yes Historical Provider, MD  RANEXA 500 MG 12 hr tablet TAKE 1 TABLET BY MOUTH TWICE DAILY 11/30/13  Yes Sueanne Margarita, MD  ranitidine (ZANTAC) 150 MG tablet Take 150 mg by mouth daily.    Yes Historical Provider, MD  sodium bicarbonate 650 MG tablet Take 1,300 mg by mouth 2 (two) times daily.  07/13/11  Yes Historical Provider, MD  tacrolimus (PROGRAF) 1 MG capsule Take 2 mg by mouth 2 (two) times daily.    Yes Historical Provider, MD  timolol (TIMOPTIC) 0.5 % ophthalmic solution Place 1 drop into both eyes 2 (two) times daily.  05/21/13  Yes Historical Provider, MD  traMADol (ULTRAM) 50 MG tablet Take 1 tablet (50 mg total) by mouth every 6 (six) hours as needed. 04/08/14  Yes Jennifer Piepenbrink, PA-C  warfarin (COUMADIN) 2.5 MG tablet Take 2.5 mg by mouth daily at 6 PM.  07/10/11  Yes Historical Provider, MD  MAGNESIUM-OXIDE 400 (241.3 MG) MG tablet TAKE 1 TABLET BY MOUTH TWICE DAILY 12/29/13   Sueanne Margarita, MD  nitroGLYCERIN  (NITROSTAT) 0.4 MG SL tablet Place 0.4 mg under the tongue every 5 (five) minutes as needed for chest pain.  08/26/13   Historical Provider, MD  RANEXA 500 MG 12 hr tablet TAKE 1 TABLET BY MOUTH TWICE DAILY Patient not taking: Reported on 04/19/2014 12/29/13   Sueanne Margarita, MD  traMADol (ULTRAM) 50 MG tablet Take 1 tablet (50 mg total) by mouth every 6 (six) hours as needed for moderate pain. Patient not taking: Reported on 04/19/2014 09/15/13   Jeneen Rinks  D Kindl, MD   BP 176/62 mmHg  Pulse 76  Temp(Src) 99.2 F (37.3 C) (Oral)  Resp 16  SpO2 96% Physical Exam  Constitutional: She is oriented to person, place, and time. She appears well-developed and well-nourished. No distress.  HENT:  Head: Normocephalic and atraumatic.  Mouth/Throat: Oropharynx is clear and moist.  Eyes: Conjunctivae are normal. Pupils are equal, round, and reactive to light. No scleral icterus.  Neck: Neck supple.  Cardiovascular: Normal rate, regular rhythm, normal heart sounds and intact distal pulses.   No murmur heard. Pulmonary/Chest: Effort normal and breath sounds normal. No stridor. No respiratory distress. She has no rales.  Abdominal: Soft. Bowel sounds are normal. She exhibits no distension. There is no tenderness.  Musculoskeletal: Normal range of motion.       Left knee: She exhibits swelling and effusion (small). She exhibits normal range of motion (pain with ROM), no ecchymosis, no deformity and no laceration. Tenderness found.       Back:  Neurological: She is alert and oriented to person, place, and time.  Skin: Skin is warm and dry. No rash noted.  Psychiatric: She has a normal mood and affect. Her behavior is normal.  Nursing note and vitals reviewed.   ED Course  Procedures (including critical care time) Labs Review Labs Reviewed  CBC - Abnormal; Notable for the following:    Hemoglobin 10.0 (*)    HCT 31.5 (*)    MCH 25.3 (*)    RDW 15.7 (*)    All other components within normal limits    BASIC METABOLIC PANEL - Abnormal; Notable for the following:    Glucose, Bld 107 (*)    BUN 33 (*)    Creatinine, Ser 2.27 (*)    GFR calc non Af Amer 22 (*)    GFR calc Af Amer 25 (*)    All other components within normal limits    Imaging Review Dg Knee Complete 4 Views Left  04/19/2014   CLINICAL DATA:  Acute onset of left knee pain. Left knee weakness. Initial encounter.  EXAM: LEFT KNEE - COMPLETE 4+ VIEW  COMPARISON:  Left knee radiographs performed 09/15/2013  FINDINGS: There is no evidence of fracture or dislocation. The joint spaces are preserved. An enthesophyte is seen arising at the upper pole of the patella. There is mild cortical irregularity along the articular surface of the patella.  A small knee joint effusion is noted. Diffuse vascular calcifications are seen.  IMPRESSION: 1. No evidence of fracture or dislocation. 2. Small knee joint effusion noted. 3. Mild degenerative change at the patellofemoral compartment. 4. Diffuse vascular calcifications seen.   Electronically Signed   By: Garald Balding M.D.   On: 04/19/2014 22:32  All radiology studies independently viewed by me.      EKG Interpretation None      MDM   Final diagnoses:  Left knee pain  Arthritis of left knee  Knee effusion, left    64 yo female with primary complaint of left knee pain.  She does complain of persistent back pain, for which she was seen in the ED and by her PCP, but she reports that this has been improving with her tramadol and muscle relaxers.  The reason she came to the ED was her knee pain.    She has a small knee effusion, but no warmth or erythema.  Afebrile.  Normal WBC count.  She had a minor injury yesterday which likely exacerbated her chronic arthritis.  Exam is not consistent with septic joint and I think risk of arthrocentesis outweighs benefit given that she is on coumadin.  However, I have given her strict return precautions, should she develop fever, warmth of joint, or  erythema.      Serita Grit, MD 04/19/14 2250

## 2014-04-19 NOTE — Discharge Instructions (Signed)
Arthritis, Nonspecific °Arthritis is inflammation of a joint. This usually means pain, redness, warmth or swelling are present. One or more joints may be involved. There are a number of types of arthritis. Your caregiver may not be able to tell what type of arthritis you have right away. °CAUSES  °The most common cause of arthritis is the wear and tear on the joint (osteoarthritis). This causes damage to the cartilage, which can break down over time. The knees, hips, back and neck are most often affected by this type of arthritis. °Other types of arthritis and common causes of joint pain include: °· Sprains and other injuries near the joint. Sometimes minor sprains and injuries cause pain and swelling that develop hours later. °· Rheumatoid arthritis. This affects hands, feet and knees. It usually affects both sides of your body at the same time. It is often associated with chronic ailments, fever, weight loss and general weakness. °· Crystal arthritis. Gout and pseudo gout can cause occasional acute severe pain, redness and swelling in the foot, ankle, or knee. °· Infectious arthritis. Bacteria can get into a joint through a break in overlying skin. This can cause infection of the joint. Bacteria and viruses can also spread through the blood and affect your joints. °· Drug, infectious and allergy reactions. Sometimes joints can become mildly painful and slightly swollen with these types of illnesses. °SYMPTOMS  °· Pain is the main symptom. °· Your joint or joints can also be red, swollen and warm or hot to the touch. °· You may have a fever with certain types of arthritis, or even feel overall ill. °· The joint with arthritis will hurt with movement. Stiffness is present with some types of arthritis. °DIAGNOSIS  °Your caregiver will suspect arthritis based on your description of your symptoms and on your exam. Testing may be needed to find the type of arthritis: °· Blood and sometimes urine tests. °· X-ray tests  and sometimes CT or MRI scans. °· Removal of fluid from the joint (arthrocentesis) is done to check for bacteria, crystals or other causes. Your caregiver (or a specialist) will numb the area over the joint with a local anesthetic, and use a needle to remove joint fluid for examination. This procedure is only minimally uncomfortable. °· Even with these tests, your caregiver may not be able to tell what kind of arthritis you have. Consultation with a specialist (rheumatologist) may be helpful. °TREATMENT  °Your caregiver will discuss with you treatment specific to your type of arthritis. If the specific type cannot be determined, then the following general recommendations may apply. °Treatment of severe joint pain includes: °· Rest. °· Elevation. °· Anti-inflammatory medication (for example, ibuprofen) may be prescribed. Avoiding activities that cause increased pain. °· Only take over-the-counter or prescription medicines for pain and discomfort as recommended by your caregiver. °· Cold packs over an inflamed joint may be used for 10 to 15 minutes every hour. Hot packs sometimes feel better, but do not use overnight. Do not use hot packs if you are diabetic without your caregiver's permission. °· A cortisone shot into arthritic joints may help reduce pain and swelling. °· Any acute arthritis that gets worse over the next 1 to 2 days needs to be looked at to be sure there is no joint infection. °Long-term arthritis treatment involves modifying activities and lifestyle to reduce joint stress jarring. This can include weight loss. Also, exercise is needed to nourish the joint cartilage and remove waste. This helps keep the muscles   around the joint strong. °HOME CARE INSTRUCTIONS  °· Do not take aspirin to relieve pain if gout is suspected. This elevates uric acid levels. °· Only take over-the-counter or prescription medicines for pain, discomfort or fever as directed by your caregiver. °· Rest the joint as much as  possible. °· If your joint is swollen, keep it elevated. °· Use crutches if the painful joint is in your leg. °· Drinking plenty of fluids may help for certain types of arthritis. °· Follow your caregiver's dietary instructions. °· Try low-impact exercise such as: °¨ Swimming. °¨ Water aerobics. °¨ Biking. °¨ Walking. °· Morning stiffness is often relieved by a warm shower. °· Put your joints through regular range-of-motion. °SEEK MEDICAL CARE IF:  °· You do not feel better in 24 hours or are getting worse. °· You have side effects to medications, or are not getting better with treatment. °SEEK IMMEDIATE MEDICAL CARE IF:  °· You have a fever. °· You develop severe joint pain, swelling or redness. °· Many joints are involved and become painful and swollen. °· There is severe back pain and/or leg weakness. °· You have loss of bowel or bladder control. °Document Released: 02/16/2004 Document Revised: 04/02/2011 Document Reviewed: 03/03/2008 °Exit[employer]® Patient Information ©2015 [employer]. This information is not intended to replace advice given to you by your health care provider. Make sure you discuss any questions you have with your health care provider. ° °

## 2014-04-19 NOTE — ED Notes (Signed)
Provided patient saltine crackers with permission from provider. Pt would like to eat crackers before taking medication. Informed pt to call out when she is ready to take medication.

## 2014-05-05 ENCOUNTER — Other Ambulatory Visit (HOSPITAL_COMMUNITY): Payer: Self-pay | Admitting: *Deleted

## 2014-05-06 ENCOUNTER — Other Ambulatory Visit: Payer: Self-pay

## 2014-05-06 ENCOUNTER — Encounter (HOSPITAL_COMMUNITY): Payer: Self-pay | Admitting: *Deleted

## 2014-05-06 ENCOUNTER — Encounter (HOSPITAL_COMMUNITY)
Admission: RE | Admit: 2014-05-06 | Discharge: 2014-05-06 | Disposition: A | Discharge status: Home / Self Care | Payer: Medicare Other | Source: Ambulatory Visit | Attending: Nephrology | Admitting: Nephrology

## 2014-05-06 ENCOUNTER — Emergency Department (HOSPITAL_COMMUNITY)
Admission: EM | Admit: 2014-05-06 | Discharge: 2014-05-06 | Disposition: A | Discharge status: Home / Self Care | Payer: Medicare Other | Attending: Emergency Medicine | Admitting: Emergency Medicine

## 2014-05-06 ENCOUNTER — Emergency Department (HOSPITAL_COMMUNITY): Payer: Medicare Other

## 2014-05-06 DIAGNOSIS — Z8701 Personal history of pneumonia (recurrent): Secondary | ICD-10-CM | POA: Diagnosis not present

## 2014-05-06 DIAGNOSIS — N186 End stage renal disease: Secondary | ICD-10-CM | POA: Diagnosis not present

## 2014-05-06 DIAGNOSIS — Z79899 Other long term (current) drug therapy: Secondary | ICD-10-CM | POA: Insufficient documentation

## 2014-05-06 DIAGNOSIS — K219 Gastro-esophageal reflux disease without esophagitis: Secondary | ICD-10-CM | POA: Insufficient documentation

## 2014-05-06 DIAGNOSIS — H409 Unspecified glaucoma: Secondary | ICD-10-CM | POA: Insufficient documentation

## 2014-05-06 DIAGNOSIS — I120 Hypertensive chronic kidney disease with stage 5 chronic kidney disease or end stage renal disease: Secondary | ICD-10-CM | POA: Diagnosis not present

## 2014-05-06 DIAGNOSIS — Z862 Personal history of diseases of the blood and blood-forming organs and certain disorders involving the immune mechanism: Secondary | ICD-10-CM | POA: Insufficient documentation

## 2014-05-06 DIAGNOSIS — M79609 Pain in unspecified limb: Secondary | ICD-10-CM | POA: Diagnosis not present

## 2014-05-06 DIAGNOSIS — Z86010 Personal history of colonic polyps: Secondary | ICD-10-CM | POA: Diagnosis not present

## 2014-05-06 DIAGNOSIS — Z7901 Long term (current) use of anticoagulants: Secondary | ICD-10-CM | POA: Insufficient documentation

## 2014-05-06 DIAGNOSIS — R011 Cardiac murmur, unspecified: Secondary | ICD-10-CM | POA: Diagnosis not present

## 2014-05-06 DIAGNOSIS — Z87891 Personal history of nicotine dependence: Secondary | ICD-10-CM | POA: Diagnosis not present

## 2014-05-06 DIAGNOSIS — I2510 Atherosclerotic heart disease of native coronary artery without angina pectoris: Secondary | ICD-10-CM | POA: Diagnosis not present

## 2014-05-06 DIAGNOSIS — Z940 Kidney transplant status: Secondary | ICD-10-CM | POA: Insufficient documentation

## 2014-05-06 DIAGNOSIS — M79605 Pain in left leg: Secondary | ICD-10-CM

## 2014-05-06 DIAGNOSIS — Z9889 Other specified postprocedural states: Secondary | ICD-10-CM | POA: Insufficient documentation

## 2014-05-06 DIAGNOSIS — Z992 Dependence on renal dialysis: Secondary | ICD-10-CM | POA: Insufficient documentation

## 2014-05-06 DIAGNOSIS — M1990 Unspecified osteoarthritis, unspecified site: Secondary | ICD-10-CM | POA: Diagnosis not present

## 2014-05-06 DIAGNOSIS — R079 Chest pain, unspecified: Secondary | ICD-10-CM | POA: Diagnosis present

## 2014-05-06 DIAGNOSIS — Z7982 Long term (current) use of aspirin: Secondary | ICD-10-CM | POA: Insufficient documentation

## 2014-05-06 DIAGNOSIS — Z86718 Personal history of other venous thrombosis and embolism: Secondary | ICD-10-CM | POA: Insufficient documentation

## 2014-05-06 DIAGNOSIS — E785 Hyperlipidemia, unspecified: Secondary | ICD-10-CM | POA: Diagnosis not present

## 2014-05-06 DIAGNOSIS — M7989 Other specified soft tissue disorders: Secondary | ICD-10-CM | POA: Diagnosis not present

## 2014-05-06 LAB — CBC WITH DIFFERENTIAL/PLATELET
BASOS ABS: 0 10*3/uL (ref 0.0–0.1)
Basophils Relative: 0 % (ref 0–1)
EOS PCT: 0 % (ref 0–5)
Eosinophils Absolute: 0 10*3/uL (ref 0.0–0.7)
HEMATOCRIT: 27 % — AB (ref 36.0–46.0)
HEMOGLOBIN: 9 g/dL — AB (ref 12.0–15.0)
LYMPHS ABS: 1.4 10*3/uL (ref 0.7–4.0)
Lymphocytes Relative: 14 % (ref 12–46)
MCH: 24.9 pg — ABNORMAL LOW (ref 26.0–34.0)
MCHC: 33.3 g/dL (ref 30.0–36.0)
MCV: 74.8 fL — ABNORMAL LOW (ref 78.0–100.0)
MONO ABS: 0.5 10*3/uL (ref 0.1–1.0)
MONOS PCT: 5 % (ref 3–12)
NEUTROS ABS: 7.9 10*3/uL — AB (ref 1.7–7.7)
NEUTROS PCT: 81 % — AB (ref 43–77)
Platelets: 228 10*3/uL (ref 150–400)
RBC: 3.61 MIL/uL — AB (ref 3.87–5.11)
RDW: 15.7 % — ABNORMAL HIGH (ref 11.5–15.5)
WBC: 9.7 10*3/uL (ref 4.0–10.5)

## 2014-05-06 LAB — I-STAT CHEM 8, ED
BUN: 43 mg/dL — ABNORMAL HIGH (ref 6–23)
Calcium, Ion: 1.25 mmol/L (ref 1.13–1.30)
Chloride: 97 mmol/L (ref 96–112)
Creatinine, Ser: 2.7 mg/dL — ABNORMAL HIGH (ref 0.50–1.10)
Glucose, Bld: 117 mg/dL — ABNORMAL HIGH (ref 70–99)
HEMATOCRIT: 27 % — AB (ref 36.0–46.0)
Hemoglobin: 9.2 g/dL — ABNORMAL LOW (ref 12.0–15.0)
POTASSIUM: 5.1 mmol/L (ref 3.5–5.1)
SODIUM: 129 mmol/L — AB (ref 135–145)
TCO2: 22 mmol/L (ref 0–100)

## 2014-05-06 LAB — I-STAT TROPONIN, ED
TROPONIN I, POC: 0.01 ng/mL (ref 0.00–0.08)
Troponin i, poc: 0.01 ng/mL (ref 0.00–0.08)

## 2014-05-06 LAB — PROTIME-INR
INR: 3.75 — ABNORMAL HIGH (ref 0.00–1.49)
Prothrombin Time: 37.4 seconds — ABNORMAL HIGH (ref 11.6–15.2)

## 2014-05-06 LAB — POCT HEMOGLOBIN-HEMACUE: Hemoglobin: 7.4 g/dL — ABNORMAL LOW (ref 12.0–15.0)

## 2014-05-06 MED ORDER — SODIUM CHLORIDE 0.9 % IV SOLN
510.0000 mg | Freq: Once | INTRAVENOUS | Status: AC
  Administered 2014-05-06: 510 mg via INTRAVENOUS
  Filled 2014-05-06: qty 17

## 2014-05-06 MED ORDER — EPOETIN ALFA 40000 UNIT/ML IJ SOLN
INTRAMUSCULAR | Status: AC
  Filled 2014-05-06: qty 1

## 2014-05-06 MED ORDER — EPOETIN ALFA 40000 UNIT/ML IJ SOLN
40000.0000 [IU] | Freq: Once | INTRAMUSCULAR | Status: AC
  Administered 2014-05-06: 40000 [IU] via SUBCUTANEOUS

## 2014-05-06 MED ORDER — OXYCODONE-ACETAMINOPHEN 5-325 MG PO TABS: 1.0000 | ORAL_TABLET | ORAL | Status: DC | PRN

## 2014-05-06 MED ORDER — EPOETIN ALFA 10000 UNIT/ML IJ SOLN: 20000.0000 [IU] | INTRAMUSCULAR | Status: DC

## 2014-05-06 NOTE — Progress Notes (Signed)
Pt came in today for procrit shot and feraheme infusion.  Pts hemocue was 7.4  CK was informed procrit was increased to 40000 units.  Once Feraheme was started the patient started complaining that she was having chest pain hip pain and back pain that was pulsating and was 8/10.  She received 15 cc of feraheme.  Infusion was stopped.  [facility] was called.  I talked to [place] she said to take patient to the emergemcy department.  Pt's VS when we stopped the infusion was 100% RA, 164/57 and pulse was 69.

## 2014-05-06 NOTE — ED Notes (Signed)
Pt states she is not having any pain at this time. Pt alert and oriented

## 2014-05-06 NOTE — Discharge Instructions (Signed)

## 2014-05-06 NOTE — Progress Notes (Signed)
VASCULAR LAB PRELIMINARY  PRELIMINARY  PRELIMINARY  PRELIMINARY  Left lower extremity venous duplex completed.    Preliminary report:  Left:  No evidence of DVTor superficial thrombosis.  Fluid noted behind and surrounding the knee.  Debbie [name], RVT 05/06/2014, 8:16 PM

## 2014-05-06 NOTE — ED Provider Notes (Signed)
64 year old female, presents with acute onset of sharp chest pain which were short-lived, occurred during an iron infusion for her chronic anemia, resolved spontaneously within 10-15 minutes. At this time she is pain-free, she has a brace on her left knee after she felt a pop in the recent past. She denies any ongoing significant leg symptoms but does have some discomfort around that knee, there is no shortness of breath, her vital signs are unremarkable, on exam she has clear heart and lung sounds, no murmur, no arrhythmia, soft abdomen, no significant peripheral edema or asymmetry. She will need EKG, delta troponin, Doppler ultrasound of the lower extremity.  ED ECG REPORT  I personally interpreted this EKG   Date: 05/07/2014   Rate: 68  Rhythm: normal sinus rhythm  QRS Axis: normal  Intervals: normal  ST/T Wave abnormalities: normal  Conduction Disutrbances:none  Narrative Interpretation:   Old EKG Reviewed: none available  I saw and evaluated the patient, reviewed the resident's note and I agree with the findings and plan.    Final diagnoses:  Pain of left lower extremity  Chest pain, unspecified chest pain type      Noemi Chapel, MD 05/07/14 1149

## 2014-05-06 NOTE — ED Notes (Signed)
Pt in radiology 

## 2014-05-06 NOTE — ED Provider Notes (Signed)
CSN: [id number]     Arrival date & time 05/06/14  1528 History   First MD Initiated Contact with Patient 05/06/14 1550     Chief Complaint  Patient presents with  . Chest Pain  . Hip Pain     (Consider location/radiation/quality/duration/timing/severity/associated sxs/prior Treatment) HPI  This is a 64 yo female with PMH ESRD sp renal transplant in 2010, HTN, a fib, just stopped coumadin, DVT of the LLE in 1990, lupus, just started iron transfusion today, presenting with complaint of chest pain.  Onset less than an hour ago.  Located right upper chest, "stabbing, pulsating."  Lasted fifteen minutes.  She was completely pain free, transfusion began, she became symptomatic, transfusion was stopped, and pain immediately stopped.  Non-radiating.  She completely denies, SOB, cough, nausea, vomiting, diaphoresis.  She has been suffering from left knee pain for over one year.  It hurt after a "pop."  States it got better, but has recently worsened without known injury.  Past Medical History  Diagnosis Date  . Kidney transplant as cause of abnormal reaction or later complication   . Hypertension   . Colon polyps   . Gall stones 1980  . Anti-phospholipid antibody syndrome     (Based on hospital discharge summary march 2013)  . Paroxysmal atrial fibrillation   . Carpal tunnel syndrome 2003    lt  . Esophageal stricture   . Hemorrhoids   . Renal disorder   . ESRD (end stage renal disease) on dialysis     "til I got my transplant in 2010"  . DVT (deep venous thrombosis) 1990    LLE  . History of blood transfusion     "related to the lupus"  . Dyslipidemia 07/22/2013  . Coronary artery disease   . Heart murmur     mild MR on echo  . Pneumonia     "3 times now" (08/25/2013)  . GERD (gastroesophageal reflux disease)   . Arthritis     "fingers" (08/25/2013)  . Glaucoma, left eye   . Bilateral carotid artery stenosis     1-39%  . Pulmonary HTN     PASP 40mmHg   Past Surgical History   Procedure Laterality Date  . Nephrectomy transplanted organ  05/2008  . Cholecystectomy  1980  . Hematoma evacuation  1998    "after they took peritoneal catheter out"  . Peritoneal catheter insertion    . Peritoneal catheter removal    . Carpal tunnel release Right 07/2001  . Arteriovenous graft placement    . Thrombectomy and revision of arterioventous (av) goretex  graft Right 07/1999; 04/2002; 09/2002; 12/2005; 09/2007;     upper arm/notes 07/31/1999; 05/18/2002; 10/07/2002; 01/14/2006; 09/14/2007;   . Right heart catheterization N/A 08/28/2013    Procedure: RIGHT HEART CATH;  Surgeon: Sinclair Grooms, MD;  Location: [facility] CATH LAB;  Service: Cardiovascular;  Laterality: N/A;   Family History  Problem Relation Age of Onset  . Hypertension Mother   . Heart disease Mother   . Colon cancer Neg Hx   . Rectal cancer Neg Hx   . Stomach cancer Neg Hx    History  Substance Use Topics  . Smoking status: Former Smoker -- 1.00 packs/day for 30 years    Types: Cigarettes    Quit date: 04/16/1977  . Smokeless tobacco: Never Used  . Alcohol Use: Yes     Comment: "quit drinking in 1979"   OB History    No data available  Review of Systems  Constitutional: Negative for fever and chills.  HENT: Negative for facial swelling.   Eyes: Negative for photophobia and pain.  Respiratory: Negative for cough and shortness of breath.   Cardiovascular: Positive for chest pain (resolved). Negative for leg swelling.  Gastrointestinal: Negative for nausea, vomiting and abdominal pain.  Genitourinary: Negative for dysuria.  Musculoskeletal: Positive for arthralgias (left knee).  Skin: Negative for rash and wound.  Neurological: Negative for seizures.  Hematological: Negative for adenopathy.      Allergies  Vancomycin; Dorzolamide hcl-timolol mal; Gentamycin; Latanoprost; Cefazolin; Codeine; Hydrocodone-acetaminophen; and Penicillins  Home Medications   Prior to Admission medications   Medication  Sig Start Date End Date Taking? Authorizing Provider  acetaminophen (TYLENOL) 500 MG tablet Take 1,000 mg by mouth every 6 (six) hours as needed for moderate pain (pain).    Historical Provider, MD  amLODipine (NORVASC) 5 MG tablet Take 1 tablet (5 mg total) by mouth daily. 09/01/13   Rise Mu, PA-C  aspirin EC 81 MG tablet Take 81 mg by mouth every evening.     Historical Provider, MD  atorvastatin (LIPITOR) 10 MG tablet Take 1 tablet (10 mg total) by mouth daily. 07/22/13   Sueanne Margarita, MD  bimatoprost (LUMIGAN) 0.01 % SOLN Place 1 drop into both eyes at bedtime.  05/21/13   Historical Provider, MD  calcitRIOL (ROCALTROL) 0.25 MCG capsule Take 0.25-0.5 mcg by mouth daily. Take 0.25 mcg daily alternating with 0.5 mcg 06/12/13   Historical Provider, MD  cyclobenzaprine (FLEXERIL) 10 MG tablet Take 10 mg by mouth 3 (three) times daily as needed for muscle spasms (muscle spasms).    Historical Provider, MD  furosemide (LASIX) 20 MG tablet Take 20 mg by mouth daily. Excess edema    Historical Provider, MD  isosorbide dinitrate (ISORDIL) 10 MG tablet TAKE 1 TABLET BY MOUTH THREE TIMES DAILY 03/04/14   Erlene Quan, PA-C  labetalol (NORMODYNE) 200 MG tablet Take 100 mg by mouth 2 (two) times daily.     Historical Provider, MD  MAGNESIUM-OXIDE 400 (241.3 MG) MG tablet TAKE 1 TABLET BY MOUTH TWICE DAILY 12/29/13   Sueanne Margarita, MD  mycophenolate (MYFORTIC) 180 MG EC tablet Take 180 mg by mouth 2 (two) times daily.     Historical Provider, MD  nitroGLYCERIN (NITROSTAT) 0.4 MG SL tablet Place 0.4 mg under the tongue every 5 (five) minutes as needed for chest pain.  08/26/13   Historical Provider, MD  predniSONE (DELTASONE) 5 MG tablet Take 5 mg by mouth daily with breakfast.  04/24/12   Historical Provider, MD  RANEXA 500 MG 12 hr tablet TAKE 1 TABLET BY MOUTH TWICE DAILY 11/30/13   Sueanne Margarita, MD  RANEXA 500 MG 12 hr tablet TAKE 1 TABLET BY MOUTH TWICE DAILY Patient not taking: Reported on 04/19/2014  12/29/13   Sueanne Margarita, MD  ranitidine (ZANTAC) 150 MG tablet Take 150 mg by mouth daily.     Historical Provider, MD  sodium bicarbonate 650 MG tablet Take 1,300 mg by mouth 2 (two) times daily.  07/13/11   Historical Provider, MD  tacrolimus (PROGRAF) 1 MG capsule Take 2 mg by mouth 2 (two) times daily.     Historical Provider, MD  timolol (TIMOPTIC) 0.5 % ophthalmic solution Place 1 drop into both eyes 2 (two) times daily.  05/21/13   Historical Provider, MD  traMADol (ULTRAM) 50 MG tablet Take 1 tablet (50 mg total) by mouth every 6 (six) hours  as needed for moderate pain. Patient not taking: Reported on 04/19/2014 09/15/13   Billy Fischer, MD  traMADol (ULTRAM) 50 MG tablet Take 1 tablet (50 mg total) by mouth every 6 (six) hours as needed. 04/08/14   Baron Sane, PA-C  warfarin (COUMADIN) 2.5 MG tablet Take 2.5 mg by mouth daily at 6 PM.  07/10/11   Historical Provider, MD   BP 168/76 mmHg  Pulse 69  Temp(Src) 98 F (36.7 C) (Oral)  Resp 16  Ht 5\' 1"  (1.549 m)  Wt 161 lb (73.029 kg)  BMI 30.44 kg/m2  SpO2 100% Physical Exam  Constitutional: She is oriented to person, place, and time. She appears well-developed and well-nourished. No distress.  HENT:  Head: Normocephalic and atraumatic.  Mouth/Throat: No oropharyngeal exudate.  Eyes: Conjunctivae are normal. Pupils are equal, round, and reactive to light. No scleral icterus.  Neck: Normal range of motion. No tracheal deviation present. No thyromegaly present.  Cardiovascular: Normal rate, regular rhythm and normal heart sounds.  Exam reveals no gallop and no friction rub.   No murmur heard. Pulmonary/Chest: Effort normal and breath sounds normal. No stridor. No respiratory distress. She has no wheezes. She has no rales. She exhibits no tenderness.  Abdominal: Soft. She exhibits no distension and no mass. There is no tenderness. There is no rebound and no guarding.  Musculoskeletal:  TTP of left knee, with associated swelling   Neurological: She is alert and oriented to person, place, and time.  Skin: Skin is warm and dry. She is not diaphoretic.    ED Course  Procedures (including critical care time) Labs Review Labs Reviewed  CBC WITH DIFFERENTIAL/PLATELET - Abnormal; Notable for the following:    RBC 3.61 (*)    Hemoglobin 9.0 (*)    HCT 27.0 (*)    MCV 74.8 (*)    MCH 24.9 (*)    RDW 15.7 (*)    Neutrophils Relative % 81 (*)    Neutro Abs 7.9 (*)    All other components within normal limits  I-STAT CHEM 8, ED - Abnormal; Notable for the following:    Sodium 129 (*)    BUN 43 (*)    Creatinine, Ser 2.70 (*)    Glucose, Bld 117 (*)    Hemoglobin 9.2 (*)    HCT 27.0 (*)    All other components within normal limits  PROTIME-INR  I-STAT TROPOININ, ED    Imaging Review Dg Chest 2 View  05/06/2014   CLINICAL DATA:  Right chest pain  EXAM: CHEST  2 VIEW  COMPARISON:  04/08/2014  FINDINGS: Scarring in the anterior left upper lobe/ lingula and at the left lung base.  Possible small left pleural effusion versus pleural thickening. No pneumothorax.  The heart is normal in size.  Degenerative changes of the visualized thoracolumbar spine.  IMPRESSION: Scarring in the left hemithorax, chronic.  Possible small left pleural effusion versus pleural thickening.   Electronically Signed   By: Julian Hy M.D.   On: 05/06/2014 17:22     EKG Interpretation None     ED ECG REPORT   Date: 05/06/2014  Rate: 68  Rhythm: normal sinus rhythm  QRS Axis: normal  Intervals: normal  ST/T Wave abnormalities: normal  Conduction Disutrbances:none  Narrative Interpretation: No change since EKG on 04/08/14.  Old EKG Reviewed: unchanged  I have personally reviewed the EKG tracing and agree with the computerized printout as noted.  MDM   Final diagnoses:  Pain of left  lower extremity  Chest pain, unspecified chest pain type    This is a 64 yo female with PMH ESRD sp renal transplant in 2010, HTN, a fib, just  stopped coumadin, DVT of the LLE in 1990, lupus, just started iron transfusion today, presenting with complaint of chest pain.  Onset less than an hour ago.  Located right upper chest, "stabbing, pulsating."  Lasted fifteen minutes.  She was completely pain free, transfusion began, she became symptomatic, transfusion was stopped, and pain immediately stopped.  Non-radiating.  She completely denies, SOB, cough, nausea, vomiting, diaphoresis.  She has been suffering from left knee pain for over one year.  It hurt after a "pop."  States it got better, but has recently worsened without known injury.  Pt is completely asymptomatic at this time.  CV exam has no acute change, respiratory exam is completely WNL.  No LE edema with the except of focal LLE swelling.  Positive fo TTP of the left popliteal area.  Abdominal exam is WNL.  EKG is without acute changes.  Will send labs, perform delta troponin.  Will RO LLE DVT with LLE doppler.  If WNL, I anticipate discharge.  Troponins WNL x 2.  LLE doppler is without DVT.  I doubt ACS, DVT, PE, PTX, PNA, pericarditis, tamponade, dissection, esophageal pathology at this time.  I suspect reaction associated with Fe infusion today.  Pt stable for discharge, FU with ortho tomorrow.  All questions answered.  Return precautions given.  I have discussed case and care has been guided by my attending physician, Dr. Sabra Heck.  Doy Hutching, MD 05/06/14 [id number]  Noemi Chapel, MD 05/07/14 1150

## 2014-05-06 NOTE — Progress Notes (Signed)
hemocue today 7.4,  Called and spoke to crystal sutton cma and reported result and that pt states she has not seen any bleeding and does not feel bad.  Orders received to increase procrit from 20,000u to 40,000u

## 2014-05-06 NOTE — ED Notes (Signed)
Pt states she started an iron infusion today, within the first five minutes of the infusion pt experienced sharp chest pain and sharp bilateral hip pain. Pt denied shortness of breath, n/v with the pain. When the infusion stopped the chest and hip pain stopped. Pt was instructed to come to ED for further evaluation. Pt is a kidney transplant pt, denies any pain at this time.

## 2014-05-07 LAB — IRON AND TIBC
Iron: 13 ug/dL — ABNORMAL LOW (ref 42–145)
Saturation Ratios: 8 % — ABNORMAL LOW (ref 20–55)
TIBC: 165 ug/dL — AB (ref 250–470)
UIBC: 152 ug/dL (ref 125–400)

## 2014-05-07 LAB — FERRITIN: Ferritin: 902 ng/mL — ABNORMAL HIGH (ref 10–291)

## 2014-05-12 ENCOUNTER — Other Ambulatory Visit (HOSPITAL_COMMUNITY): Payer: Self-pay | Admitting: *Deleted

## 2014-05-13 ENCOUNTER — Encounter (HOSPITAL_COMMUNITY): Payer: Medicare Other

## 2014-05-13 ENCOUNTER — Encounter (HOSPITAL_COMMUNITY)
Admission: RE | Admit: 2014-05-13 | Discharge: 2014-05-13 | Disposition: A | Discharge status: Home / Self Care | Payer: Medicare Other | Source: Ambulatory Visit | Attending: Nephrology | Admitting: Nephrology

## 2014-05-13 DIAGNOSIS — N184 Chronic kidney disease, stage 4 (severe): Secondary | ICD-10-CM | POA: Diagnosis not present

## 2014-05-13 DIAGNOSIS — Z5181 Encounter for therapeutic drug level monitoring: Secondary | ICD-10-CM | POA: Insufficient documentation

## 2014-05-13 DIAGNOSIS — D631 Anemia in chronic kidney disease: Secondary | ICD-10-CM | POA: Diagnosis not present

## 2014-05-13 DIAGNOSIS — Z79899 Other long term (current) drug therapy: Secondary | ICD-10-CM | POA: Diagnosis not present

## 2014-05-13 MED ORDER — SODIUM CHLORIDE 0.9 % IV SOLN
200.0000 mg | INTRAVENOUS | Status: DC
  Administered 2014-05-13: 200 mg via INTRAVENOUS
  Filled 2014-05-13: qty 10

## 2014-05-13 MED ORDER — DIPHENHYDRAMINE HCL 25 MG PO CAPS
ORAL_CAPSULE | ORAL | Status: AC
  Administered 2014-05-13: 25 mg
  Filled 2014-05-13: qty 1

## 2014-05-13 MED ORDER — DIPHENHYDRAMINE HCL 25 MG PO CAPS: 25.0000 mg | ORAL_CAPSULE | Freq: Once | ORAL | Status: DC

## 2014-05-13 MED ORDER — ACETAMINOPHEN 325 MG PO TABS: 650.0000 mg | ORAL_TABLET | Freq: Once | ORAL | Status: DC

## 2014-05-13 MED ORDER — ACETAMINOPHEN 325 MG PO TABS: 650.0000 mg | ORAL_TABLET | Freq: Four times a day (QID) | ORAL | Status: DC | PRN

## 2014-05-13 MED ORDER — ACETAMINOPHEN 325 MG PO TABS
ORAL_TABLET | ORAL | Status: AC
  Administered 2014-05-13: 650 mg
  Filled 2014-05-13: qty 2

## 2014-05-19 MED ORDER — DIPHENHYDRAMINE HCL 25 MG PO TABS: 25.0000 mg | ORAL_TABLET | Freq: Once | ORAL | Status: DC

## 2014-05-19 MED ORDER — ACETAMINOPHEN 325 MG PO TABS: 650.0000 mg | ORAL_TABLET | Freq: Once | ORAL | Status: DC

## 2014-05-20 ENCOUNTER — Encounter (HOSPITAL_COMMUNITY)
Admission: RE | Admit: 2014-05-20 | Discharge: 2014-05-20 | Disposition: A | Discharge status: Home / Self Care | Payer: Medicare Other | Source: Ambulatory Visit | Attending: Nephrology | Admitting: Nephrology

## 2014-05-20 DIAGNOSIS — N184 Chronic kidney disease, stage 4 (severe): Secondary | ICD-10-CM | POA: Diagnosis not present

## 2014-05-20 LAB — POCT HEMOGLOBIN-HEMACUE: Hemoglobin: 9.8 g/dL — ABNORMAL LOW (ref 12.0–15.0)

## 2014-05-20 MED ORDER — ACETAMINOPHEN 325 MG PO TABS
650.0000 mg | ORAL_TABLET | Freq: Once | ORAL | Status: DC
  Administered 2014-05-20: 650 mg via ORAL

## 2014-05-20 MED ORDER — DIPHENHYDRAMINE HCL 25 MG PO TABS
25.0000 mg | ORAL_TABLET | Freq: Once | ORAL | Status: DC
  Administered 2014-05-20: 25 mg via ORAL
  Filled 2014-05-20: qty 1

## 2014-05-20 MED ORDER — ACETAMINOPHEN 325 MG PO TABS
ORAL_TABLET | ORAL | Status: AC
  Administered 2014-05-20: 650 mg via ORAL
  Filled 2014-05-20: qty 2

## 2014-05-20 MED ORDER — DIPHENHYDRAMINE HCL 25 MG PO CAPS
ORAL_CAPSULE | ORAL | Status: AC
  Administered 2014-05-20: 25 mg via ORAL
  Filled 2014-05-20: qty 1

## 2014-05-20 MED ORDER — EPOETIN ALFA 10000 UNIT/ML IJ SOLN: 40000.0000 [IU] | INTRAMUSCULAR | Status: DC

## 2014-05-20 MED ORDER — EPOETIN ALFA 40000 UNIT/ML IJ SOLN
INTRAMUSCULAR | Status: AC
  Administered 2014-05-20: 40000 [IU] via SUBCUTANEOUS
  Filled 2014-05-20: qty 1

## 2014-05-20 MED ORDER — SODIUM CHLORIDE 0.9 % IV SOLN
200.0000 mg | Freq: Once | INTRAVENOUS | Status: AC
  Administered 2014-05-20: 200 mg via INTRAVENOUS
  Filled 2014-05-20: qty 10

## 2014-05-27 ENCOUNTER — Encounter (HOSPITAL_COMMUNITY)
Admission: RE | Admit: 2014-05-27 | Discharge: 2014-05-27 | Disposition: A | Discharge status: Home / Self Care | Payer: Medicare Other | Source: Ambulatory Visit | Attending: Nephrology | Admitting: Nephrology

## 2014-05-27 DIAGNOSIS — D631 Anemia in chronic kidney disease: Secondary | ICD-10-CM | POA: Insufficient documentation

## 2014-05-27 DIAGNOSIS — N184 Chronic kidney disease, stage 4 (severe): Secondary | ICD-10-CM | POA: Insufficient documentation

## 2014-05-27 DIAGNOSIS — Z79899 Other long term (current) drug therapy: Secondary | ICD-10-CM | POA: Insufficient documentation

## 2014-05-27 DIAGNOSIS — Z5181 Encounter for therapeutic drug level monitoring: Secondary | ICD-10-CM | POA: Diagnosis not present

## 2014-05-27 MED ORDER — ACETAMINOPHEN 325 MG PO TABS
650.0000 mg | ORAL_TABLET | Freq: Once | ORAL | Status: AC
  Administered 2014-05-27: 650 mg via ORAL

## 2014-05-27 MED ORDER — ACETAMINOPHEN 325 MG PO TABS
ORAL_TABLET | ORAL | Status: AC
  Filled 2014-05-27: qty 2

## 2014-05-27 MED ORDER — DIPHENHYDRAMINE HCL 25 MG PO CAPS
ORAL_CAPSULE | ORAL | Status: AC
  Filled 2014-05-27: qty 1

## 2014-05-27 MED ORDER — IRON SUCROSE 20 MG/ML IV SOLN
200.0000 mg | Freq: Once | INTRAVENOUS | Status: AC
  Administered 2014-05-27: 200 mg via INTRAVENOUS
  Filled 2014-05-27: qty 10

## 2014-05-27 MED ORDER — DIPHENHYDRAMINE HCL 25 MG PO CAPS
25.0000 mg | ORAL_CAPSULE | Freq: Once | ORAL | Status: AC
  Administered 2014-05-27: 25 mg via ORAL

## 2014-06-02 ENCOUNTER — Encounter (HOSPITAL_COMMUNITY): Payer: Self-pay | Admitting: Emergency Medicine

## 2014-06-02 ENCOUNTER — Emergency Department (INDEPENDENT_AMBULATORY_CARE_PROVIDER_SITE_OTHER)
Admission: EM | Admit: 2014-06-02 | Discharge: 2014-06-02 | Disposition: A | Discharge status: Home / Self Care | Payer: Medicare Other | Source: Home / Self Care | Attending: Emergency Medicine | Admitting: Emergency Medicine

## 2014-06-02 DIAGNOSIS — R42 Dizziness and giddiness: Secondary | ICD-10-CM

## 2014-06-02 LAB — POCT I-STAT, CHEM 8
BUN: 39 mg/dL — ABNORMAL HIGH (ref 6–20)
Calcium, Ion: 1.25 mmol/L (ref 1.13–1.30)
Chloride: 100 mmol/L — ABNORMAL LOW (ref 101–111)
Creatinine, Ser: 2.7 mg/dL — ABNORMAL HIGH (ref 0.44–1.00)
Glucose, Bld: 126 mg/dL — ABNORMAL HIGH (ref 70–99)
HEMATOCRIT: 33 % — AB (ref 36.0–46.0)
Hemoglobin: 11.2 g/dL — ABNORMAL LOW (ref 12.0–15.0)
Potassium: 5.3 mmol/L — ABNORMAL HIGH (ref 3.5–5.1)
Sodium: 131 mmol/L — ABNORMAL LOW (ref 135–145)
TCO2: 19 mmol/L (ref 0–100)

## 2014-06-02 NOTE — ED Provider Notes (Signed)
CSN: [id number]     Arrival date & time 06/02/14  1945 History   None    Chief Complaint  Patient presents with  . Dizziness   (Consider location/radiation/quality/duration/timing/severity/associated sxs/prior Treatment) HPI She is a 64 year old woman here with her daughter for evaluation of dizziness. She has a history of kidney transplant as well as cardiovascular disease.  She is on Coumadin. She states she was sitting in a chair with her feet up and she nodded off to sleep. Her head was flexed forward with her chin resting on her chest. When she woke up and raised her head she reports seeing "black birds" in vision. These went away after about 10-15 minutes. She also reports feeling dizzy when she first raised her head. It is described as a spinning sensation. The dizziness has resolved. She reports feeling just the tiniest bit off still. She does not feel off balance. No chest pain or shortness of breath. She was last seen by Dr. Wynona Neat last month and everything was going well. She has been taking all of her medications as prescribed.  Past Medical History  Diagnosis Date  . Kidney transplant as cause of abnormal reaction or later complication   . Hypertension   . Colon polyps   . Gall stones 1980  . Anti-phospholipid antibody syndrome     (Based on hospital discharge summary march 2013)  . Paroxysmal atrial fibrillation   . Carpal tunnel syndrome 2003    lt  . Esophageal stricture   . Hemorrhoids   . Renal disorder   . ESRD (end stage renal disease) on dialysis     "til I got my transplant in 2010"  . DVT (deep venous thrombosis) 1990    LLE  . History of blood transfusion     "related to the lupus"  . Dyslipidemia 07/22/2013  . Coronary artery disease   . Heart murmur     mild MR on echo  . Pneumonia     "3 times now" (08/25/2013)  . GERD (gastroesophageal reflux disease)   . Arthritis     "fingers" (08/25/2013)  . Glaucoma, left eye   . Bilateral carotid artery  stenosis     1-39%  . Pulmonary HTN     PASP 74mmHg   Past Surgical History  Procedure Laterality Date  . Nephrectomy transplanted organ  05/2008  . Cholecystectomy  1980  . Hematoma evacuation  1998    "after they took peritoneal catheter out"  . Peritoneal catheter insertion    . Peritoneal catheter removal    . Carpal tunnel release Right 07/2001  . Arteriovenous graft placement    . Thrombectomy and revision of arterioventous (av) goretex  graft Right 07/1999; 04/2002; 09/2002; 12/2005; 09/2007;     upper arm/notes 07/31/1999; 05/18/2002; 10/07/2002; 01/14/2006; 09/14/2007;   . Right heart catheterization N/A 08/28/2013    Procedure: RIGHT HEART CATH;  Surgeon: Sinclair Grooms, MD;  Location[facility] CATH LAB;  Service: Cardiovascular;  Laterality: N/A;   Family History  Problem Relation Age of Onset  . Hypertension Mother   . Heart disease Mother   . Colon cancer Neg Hx   . Rectal cancer Neg Hx   . Stomach cancer Neg Hx    History  Substance Use Topics  . Smoking status: Former Smoker -- 1.00 packs/day for 30 years    Types: Cigarettes    Quit date: 04/16/1977  . Smokeless tobacco: Never Used  . Alcohol Use: Yes  Comment: "quit drinking in 1979"   OB History    No data available     Review of Systems As in history of present illness Allergies  Feraheme; Vancomycin; Dorzolamide hcl-timolol mal; Gentamycin; Latanoprost; Cefazolin; Codeine; Hydrocodone-acetaminophen; and Penicillins  Home Medications   Prior to Admission medications   Medication Sig Start Date End Date Taking? Authorizing Provider  acetaminophen (TYLENOL) 500 MG tablet Take 1,000 mg by mouth every 6 (six) hours as needed for moderate pain (pain).    Historical Provider, MD  amLODipine (NORVASC) 5 MG tablet Take 1 tablet (5 mg total) by mouth daily. 09/01/13   Rise Mu, PA-C  aspirin EC 81 MG tablet Take 81 mg by mouth every evening.     Historical Provider, MD  atorvastatin (LIPITOR) 10 MG tablet Take 1  tablet (10 mg total) by mouth daily. 07/22/13   Sueanne Margarita, MD  bimatoprost (LUMIGAN) 0.01 % SOLN Place 1 drop into both eyes at bedtime.  05/21/13   Historical Provider, MD  calcitRIOL (ROCALTROL) 0.25 MCG capsule Take 0.25-0.5 mcg by mouth daily. Take 0.25 mcg daily alternating with 0.5 mcg 06/12/13   Historical Provider, MD  cyclobenzaprine (FLEXERIL) 10 MG tablet Take 10 mg by mouth 3 (three) times daily as needed for muscle spasms (muscle spasms).    Historical Provider, MD  furosemide (LASIX) 20 MG tablet Take 20 mg by mouth daily. Excess edema    Historical Provider, MD  isosorbide dinitrate (ISORDIL) 10 MG tablet TAKE 1 TABLET BY MOUTH THREE TIMES DAILY 03/04/14   Erlene Quan, PA-C  labetalol (NORMODYNE) 200 MG tablet Take 100 mg by mouth 2 (two) times daily.     Historical Provider, MD  MAGNESIUM-OXIDE 400 (241.3 MG) MG tablet TAKE 1 TABLET BY MOUTH TWICE DAILY 12/29/13   Sueanne Margarita, MD  mycophenolate (MYFORTIC) 180 MG EC tablet Take 180 mg by mouth 2 (two) times daily.     Historical Provider, MD  nitroGLYCERIN (NITROSTAT) 0.4 MG SL tablet Place 0.4 mg under the tongue every 5 (five) minutes as needed for chest pain.  08/26/13   Historical Provider, MD  oxyCODONE-acetaminophen (PERCOCET/ROXICET) 5-325 MG per tablet Take 1 tablet by mouth every 4 (four) hours as needed for severe pain. 05/06/14   Doy Hutching, MD  predniSONE (DELTASONE) 5 MG tablet Take 5 mg by mouth daily with breakfast.  04/24/12   Historical Provider, MD  RANEXA 500 MG 12 hr tablet TAKE 1 TABLET BY MOUTH TWICE DAILY 11/30/13   Sueanne Margarita, MD  RANEXA 500 MG 12 hr tablet TAKE 1 TABLET BY MOUTH TWICE DAILY Patient not taking: Reported on 04/19/2014 12/29/13   Sueanne Margarita, MD  ranitidine (ZANTAC) 150 MG tablet Take 150 mg by mouth daily.     Historical Provider, MD  sodium bicarbonate 650 MG tablet Take 1,300 mg by mouth 2 (two) times daily.  07/13/11   Historical Provider, MD  tacrolimus (PROGRAF) 1 MG capsule Take 2  mg by mouth 2 (two) times daily.     Historical Provider, MD  timolol (TIMOPTIC) 0.5 % ophthalmic solution Place 1 drop into both eyes 2 (two) times daily.  05/21/13   Historical Provider, MD  traMADol (ULTRAM) 50 MG tablet Take 1 tablet (50 mg total) by mouth every 6 (six) hours as needed for moderate pain. Patient not taking: Reported on 04/19/2014 09/15/13   Billy Fischer, MD  traMADol (ULTRAM) 50 MG tablet Take 1 tablet (50 mg total) by mouth  every 6 (six) hours as needed. 04/08/14   Baron Sane, PA-C  warfarin (COUMADIN) 2.5 MG tablet Take 2.5 mg by mouth daily at 6 PM.  07/10/11   Historical Provider, MD   BP 142/57 mmHg  Pulse 69  Temp(Src) 98 F (36.7 C) (Oral)  Resp 18  SpO2 99% Physical Exam  Constitutional: She is oriented to person, place, and time. She appears well-developed and well-nourished. No distress.  Eyes: Conjunctivae and EOM are normal. Pupils are equal, round, and reactive to light.  Neck: Neck supple.  Cardiovascular: Normal rate and regular rhythm.   Murmur heard. Pulmonary/Chest: Effort normal and breath sounds normal. No respiratory distress. She has no wheezes. She has no rales.  Lymphadenopathy:    She has no cervical adenopathy.  Neurological: She is oriented to person, place, and time. No cranial nerve deficit. Coordination normal.  She has some weakness in the left upper extremity, but states this is her baseline. She is able to stand up and walk with assistance. She denies feeling off balance, but has trouble walking due to knee pain.    ED Course  Procedures (including critical care time) Labs Review Labs Reviewed  POCT I-STAT, CHEM 8 - Abnormal; Notable for the following:    Sodium 131 (*)    Potassium 5.3 (*)    Chloride 100 (*)    BUN 39 (*)    Creatinine, Ser 2.70 (*)    Glucose, Bld 126 (*)    Hemoglobin 11.2 (*)    HCT 33.0 (*)    All other components within normal limits    Imaging Review No results found.   MDM   1.  Dizziness    I-STAT is comparable to blood from 4 weeks ago. Her symptoms have essentially resolved at this point. She has an appointment to get blood work done tomorrow morning. She is stable for discharge at this time. Return precautions reviewed.    Melony Overly, MD 06/02/14 2035

## 2014-06-02 NOTE — ED Notes (Signed)
Pt. Stated, I was sitting in the chair at home and fell asleep with my head down the whole time.  When I woke up and raised my head up i was dizzy.  I call Dr. Jimmy Footman and he said , It didn't sound like nothing but come to ER if I wanted.

## 2014-06-02 NOTE — Discharge Instructions (Signed)
I think your dizziness was due to how you are sleeping. Your blood work today looks about the same as it was a month ago. Please get your blood drawn as scheduled tomorrow for a recheck. If the dizziness comes back or worsens, you develop chest pain or shortness of breath, please go to the emergency room. If the spots in your vision come back, please see your eye doctor.

## 2014-06-03 ENCOUNTER — Encounter (HOSPITAL_COMMUNITY)
Admission: RE | Admit: 2014-06-03 | Discharge: 2014-06-03 | Disposition: A | Discharge status: Home / Self Care | Payer: Medicare Other | Source: Ambulatory Visit | Attending: Nephrology | Admitting: Nephrology

## 2014-06-03 DIAGNOSIS — N184 Chronic kidney disease, stage 4 (severe): Secondary | ICD-10-CM | POA: Diagnosis not present

## 2014-06-03 LAB — FERRITIN: Ferritin: 854 ng/mL — ABNORMAL HIGH (ref 11–307)

## 2014-06-03 LAB — IRON AND TIBC
IRON: 20 ug/dL — AB (ref 28–170)
Saturation Ratios: 10 % — ABNORMAL LOW (ref 10.4–31.8)
TIBC: 193 ug/dL — ABNORMAL LOW (ref 250–450)
UIBC: 173 ug/dL

## 2014-06-03 LAB — POCT HEMOGLOBIN-HEMACUE: HEMOGLOBIN: 9.6 g/dL — AB (ref 12.0–15.0)

## 2014-06-03 MED ORDER — SODIUM CHLORIDE 0.9 % IV SOLN
200.0000 mg | Freq: Once | INTRAVENOUS | Status: AC
  Administered 2014-06-03: 200 mg via INTRAVENOUS
  Filled 2014-06-03: qty 10

## 2014-06-03 MED ORDER — ACETAMINOPHEN 325 MG PO TABS
650.0000 mg | ORAL_TABLET | Freq: Once | ORAL | Status: AC
  Administered 2014-06-03: 650 mg via ORAL

## 2014-06-03 MED ORDER — DIPHENHYDRAMINE HCL 25 MG PO CAPS
ORAL_CAPSULE | ORAL | Status: AC
  Administered 2014-06-03: 25 mg via ORAL
  Filled 2014-06-03: qty 1

## 2014-06-03 MED ORDER — ACETAMINOPHEN 325 MG PO TABS
ORAL_TABLET | ORAL | Status: AC
  Administered 2014-06-03: 650 mg via ORAL
  Filled 2014-06-03: qty 2

## 2014-06-03 MED ORDER — EPOETIN ALFA 10000 UNIT/ML IJ SOLN: 40000.0000 [IU] | INTRAMUSCULAR | Status: DC

## 2014-06-03 MED ORDER — EPOETIN ALFA 40000 UNIT/ML IJ SOLN
INTRAMUSCULAR | Status: AC
  Administered 2014-06-03: 40000 [IU] via SUBCUTANEOUS
  Filled 2014-06-03: qty 1

## 2014-06-03 MED ORDER — DIPHENHYDRAMINE HCL 25 MG PO CAPS
25.0000 mg | ORAL_CAPSULE | Freq: Once | ORAL | Status: AC
  Administered 2014-06-03: 25 mg via ORAL

## 2014-06-17 ENCOUNTER — Encounter (HOSPITAL_COMMUNITY)
Admission: RE | Admit: 2014-06-17 | Discharge: 2014-06-17 | Disposition: A | Discharge status: Home / Self Care | Payer: Medicare Other | Source: Ambulatory Visit | Attending: Nephrology | Admitting: Nephrology

## 2014-06-17 DIAGNOSIS — N184 Chronic kidney disease, stage 4 (severe): Secondary | ICD-10-CM | POA: Diagnosis not present

## 2014-06-17 LAB — POCT HEMOGLOBIN-HEMACUE: HEMOGLOBIN: 10.1 g/dL — AB (ref 12.0–15.0)

## 2014-06-17 MED ORDER — EPOETIN ALFA 10000 UNIT/ML IJ SOLN: 40000.0000 [IU] | INTRAMUSCULAR | Status: DC

## 2014-06-17 MED ORDER — EPOETIN ALFA 40000 UNIT/ML IJ SOLN
INTRAMUSCULAR | Status: AC
  Administered 2014-06-17: 40000 [IU] via SUBCUTANEOUS
  Filled 2014-06-17: qty 1

## 2014-06-18 ENCOUNTER — Ambulatory Visit: Payer: Medicare Other | Admitting: Cardiology

## 2014-07-01 ENCOUNTER — Inpatient Hospital Stay (HOSPITAL_COMMUNITY): Admission: RE | Admit: 2014-07-01 | Payer: Medicare Other | Source: Ambulatory Visit

## 2014-07-29 ENCOUNTER — Other Ambulatory Visit: Payer: Self-pay | Admitting: Cardiology

## 2014-07-29 ENCOUNTER — Other Ambulatory Visit: Payer: Self-pay

## 2014-07-29 MED ORDER — ATORVASTATIN CALCIUM 10 MG PO TABS: 10.0000 mg | ORAL_TABLET | Freq: Every day | ORAL | Status: DC

## 2014-08-17 ENCOUNTER — Encounter: Payer: Self-pay | Admitting: Cardiology

## 2014-08-17 ENCOUNTER — Ambulatory Visit (INDEPENDENT_AMBULATORY_CARE_PROVIDER_SITE_OTHER): Payer: Medicare Other | Admitting: Cardiology

## 2014-08-17 VITALS — BP 141/69 | HR 64 | Ht 61.0 in | Wt 144.0 lb

## 2014-08-17 DIAGNOSIS — I270 Primary pulmonary hypertension: Secondary | ICD-10-CM | POA: Diagnosis not present

## 2014-08-17 DIAGNOSIS — E785 Hyperlipidemia, unspecified: Secondary | ICD-10-CM

## 2014-08-17 DIAGNOSIS — N184 Chronic kidney disease, stage 4 (severe): Secondary | ICD-10-CM

## 2014-08-17 DIAGNOSIS — I480 Paroxysmal atrial fibrillation: Secondary | ICD-10-CM

## 2014-08-17 DIAGNOSIS — I272 Pulmonary hypertension, unspecified: Secondary | ICD-10-CM

## 2014-08-17 DIAGNOSIS — I208 Other forms of angina pectoris: Secondary | ICD-10-CM | POA: Diagnosis not present

## 2014-08-17 DIAGNOSIS — I10 Essential (primary) hypertension: Secondary | ICD-10-CM

## 2014-08-17 DIAGNOSIS — Z7901 Long term (current) use of anticoagulants: Secondary | ICD-10-CM

## 2014-08-17 NOTE — Patient Instructions (Addendum)
Medication Instructions:  Your physician recommends that you continue on your current medications as directed. Please refer to the Current Medication list given to you today.   Labwork: FASTING LABWORK IN ONE WEEK: BMET, LFTs, Lipids  Testing/Procedures: None  Follow-Up: You have been referred to Dr. Aundra Dubin for evaluation of pulmonary hypertension.  Your physician wants you to follow-up in: 6 months with Dr. Radford Pax. You will receive a reminder letter in the mail two months in advance. If you don't receive a letter, please call our office to schedule the follow-up appointment.   Any Other Special Instructions Will Be Listed Below (If Applicable).

## 2014-08-17 NOTE — Progress Notes (Signed)
Cardiology Office Note   Date:  08/17/2014   ID:  Debbie [name], DOB [birth year], MRN [id number]  PCP:  Placido Sou, MD    Chief Complaint  Patient presents with  . Follow-up    PAF      History of Present Illness: 64 y/o female with a history of chest pain and CRI. She had a renal transplant in 2010 at [facility]. She was admitted in June and twice in Aug for chest pain. Myoview in June showed mild ischemia but it was elected to treat her medically as opposed to cath secondary to risk of renal injury. She denies any chest pain or pressure, SOB, DOE, LE edema, dizziness, palpitations or syncope.    Past Medical History  Diagnosis Date  . Kidney transplant as cause of abnormal reaction or later complication   . Hypertension   . Colon polyps   . Gall stones 1980  . Anti-phospholipid antibody syndrome     (Based on hospital discharge summary march 2013)  . Paroxysmal atrial fibrillation   . Carpal tunnel syndrome 2003    lt  . Esophageal stricture   . Hemorrhoids   . Renal disorder   . ESRD (end stage renal disease) on dialysis     "til I got my transplant in 2010"  . DVT (deep venous thrombosis) 1990    LLE  . History of blood transfusion     "related to the lupus"  . Dyslipidemia 07/22/2013  . Coronary artery disease   . Heart murmur     mild MR on echo  . Pneumonia     "3 times now" (08/25/2013)  . GERD (gastroesophageal reflux disease)   . Arthritis     "fingers" (08/25/2013)  . Glaucoma, left eye   . Bilateral carotid artery stenosis     1-39%  . Pulmonary HTN     PASP 32mmHg    Past Surgical History  Procedure Laterality Date  . Nephrectomy transplanted organ  05/2008  . Cholecystectomy  1980  . Hematoma evacuation  1998    "after they took peritoneal catheter out"  . Peritoneal catheter insertion    . Peritoneal catheter removal    . Carpal tunnel release Right 07/2001  . Arteriovenous graft placement    . Thrombectomy and revision of  arterioventous (av) goretex  graft Right 07/1999; 04/2002; 09/2002; 12/2005; 09/2007;     upper arm/notes 07/31/1999; 05/18/2002; 10/07/2002; 01/14/2006; 09/14/2007;   . Right heart catheterization N/A 08/28/2013    Procedure: RIGHT HEART CATH;  Surgeon: Sinclair Grooms, MD;  Location: [facility] CATH LAB;  Service: Cardiovascular;  Laterality: N/A;     Current Outpatient Prescriptions  Medication Sig Dispense Refill  . acetaminophen (TYLENOL) 500 MG tablet Take 1,000 mg by mouth every 6 (six) hours as needed for moderate pain (pain).    Marland Kitchen amLODipine (NORVASC) 5 MG tablet Take 1 tablet (5 mg total) by mouth daily. 30 tablet 1  . aspirin EC 81 MG tablet Take 81 mg by mouth every evening.     Marland Kitchen atorvastatin (LIPITOR) 10 MG tablet Take 1 tablet (10 mg total) by mouth daily. 30 tablet 3  . bimatoprost (LUMIGAN) 0.01 % SOLN Place 1 drop into both eyes at bedtime.     . calcitRIOL (ROCALTROL) 0.25 MCG capsule Take 0.25 mcg by mouth daily. Take .25 mcg - .89mcg by mouth daily, alternating with .5  mcg.c    . cyclobenzaprine (FLEXERIL) 10 MG tablet Take 10 mg by mouth 3 (three) times daily as needed for muscle spasms (muscle spasms).    . furosemide (LASIX) 20 MG tablet Take 20 mg by mouth daily. Excess edema    . isosorbide dinitrate (ISORDIL) 10 MG tablet TAKE 1 TABLET BY MOUTH THREE TIMES DAILY 270 tablet 3  . labetalol (NORMODYNE) 200 MG tablet Take 100 mg by mouth 2 (two) times daily.     Marland Kitchen MAGNESIUM-OXIDE 400 (241.3 MG) MG tablet TAKE 1 TABLET BY MOUTH TWICE DAILY 60 tablet 0  . mycophenolate (MYFORTIC) 180 MG EC tablet Take 180 mg by mouth 2 (two) times daily.     . nitroGLYCERIN (NITROSTAT) 0.4 MG SL tablet Place 0.4 mg under the tongue every 5 (five) minutes as needed for chest pain.     Marland Kitchen oxyCODONE-acetaminophen (PERCOCET/ROXICET) 5-325 MG per tablet Take 1 tablet by mouth every 4 (four) hours as needed for severe pain. 15 tablet 0  . predniSONE (DELTASONE) 5 MG tablet Take 5 mg by mouth daily with breakfast.      . RANEXA 500 MG 12 hr tablet TAKE 1 TABLET BY MOUTH TWICE DAILY 180 tablet 3  . ranitidine (ZANTAC) 150 MG tablet Take 150 mg by mouth daily.     . sodium bicarbonate 650 MG tablet Take 1,300 mg by mouth 2 (two) times daily.     . tacrolimus (PROGRAF) 1 MG capsule Take 2 mg by mouth 2 (two) times daily.     . timolol (TIMOPTIC) 0.5 % ophthalmic solution Place 1 drop into both eyes 2 (two) times daily.     . traMADol (ULTRAM) 50 MG tablet Take 1 tablet (50 mg total) by mouth every 6 (six) hours as needed. 15 tablet 0  . warfarin (COUMADIN) 2.5 MG tablet Take 2.5 mg by mouth daily at 6 PM.      No current facility-administered medications for this visit.    Allergies:   Feraheme; Vancomycin; Dorzolamide hcl-timolol mal; Gentamycin; Latanoprost; Cefazolin; Codeine; Hydrocodone-acetaminophen; and Penicillins    Social History:  The patient  reports that she quit smoking about 37 years ago. Her smoking use included Cigarettes. She has a 30 pack-year smoking history. She has never used smokeless tobacco. She reports that she drinks alcohol. She reports that she does not use illicit drugs.   Family History:  The patient's family history includes Heart disease in her mother; Hypertension in her mother. There is no history of Colon cancer, Rectal cancer, or Stomach cancer.    ROS:  Please see the history of present illness.   Otherwise, review of systems are positive for joint swelling in her left knee and constipation.   All other systems are reviewed and negative.    PHYSICAL EXAM: VS:  BP 141/69 mmHg  Pulse 64  Ht 5\' 1"  (1.549 m)  Wt 144 lb (65.318 kg)  BMI 27.22 kg/m2  SpO2 98% , BMI Body mass index is 27.22 kg/(m^2). GEN: Well nourished, well developed, in no acute distress HEENT: normal Neck: no JVD, carotid bruits, or masses Cardiac: RRR; no murmurs, rubs, or gallops,no edema  Respiratory:  clear to auscultation bilaterally, normal work of breathing GI: soft, nontender,  nondistended, + BS MS: no deformity or atrophy Skin: warm and dry, no rash Neuro:  Strength and sensation are intact Psych: euthymic mood, full affect   EKG:  EKG is not ordered today.    Recent Labs: 08/24/2013: Magnesium 1.2*; TSH 5.730*  08/25/2013: ALT 11 08/27/2013: Pro B Natriuretic peptide (BNP) 20481.0* 02/04/2014: B Natriuretic Peptide 1016.9* 05/06/2014: Platelets 228 06/02/2014: BUN 39*; Creatinine, Ser 2.70*; Potassium 5.3*; Sodium 131* 06/17/2014: Hemoglobin 10.1*    Lipid Panel    Component Value Date/Time   CHOL 146 08/25/2013 0150   TRIG 97 08/25/2013 0150   HDL 46 08/25/2013 0150   CHOLHDL 3.2 08/25/2013 0150   VLDL 19 08/25/2013 0150   LDLCALC 81 08/25/2013 0150      Wt Readings from Last 3 Encounters:  08/17/14 144 lb (65.318 kg)  06/03/14 160 lb (72.576 kg)  05/27/14 160 lb (72.576 kg)    ASSESSMENT AND PLAN:   Chest pain with moderate risk of acute coronary syndrome Admitted in June, and twice in Aug last year. Myoview mildly abnormal, no cath secondary to CRI. She has not had any further CP or SOB.  Continue ASA/statin/imdur/BB/Ranexa and amlodipine.   CKD (chronic kidney disease), stage IV Followed by [facility]. S/P transplant 2010 at [facility].  Pulmonary hypertension PA 73/ 31 at Rt heart cath Aug 2015.  I am going to refer her to Dr. Aundra Dubin for further evaluation.    Chronic anticoagulation - with Coumadin For anti-phospholipid antibody syndrome,  Dyslipidemia On statin Rx with atorvastatin.  Check FLP and ALT  PAF (paroxysmal atrial fibrillation) In 2010 peri-op renal transplant    Current medicines are reviewed at length with the patient today.  The patient does not have concerns regarding medicines.  The following changes have been made:  no change  Labs/ tests ordered today: See above Assessment and Plan No orders of the defined types were placed in this encounter.     Disposition:   FU with me in 6  months  Signed, Sueanne Margarita, MD  08/17/2014 2:48 PM    [facility] [address], [place], [place]  [address] Phone: [phone]; Fax: [phone]

## 2014-08-19 ENCOUNTER — Other Ambulatory Visit: Payer: Medicare Other

## 2014-09-01 NOTE — Patient Outreach (Signed)
[place] [facility]) Care Management  09/01/2014  ANNESHA DELGRECO 06-14-1950 [id number]   Referral from [place] List, assigned Mariann Laster, RN to outreach.  Thanks, Ronnell Freshwater. [place], [place] Assistant Phone: [phone] Fax: [phone]

## 2014-09-02 ENCOUNTER — Telehealth: Payer: Self-pay

## 2014-09-02 ENCOUNTER — Other Ambulatory Visit (INDEPENDENT_AMBULATORY_CARE_PROVIDER_SITE_OTHER): Payer: Medicare Other | Admitting: *Deleted

## 2014-09-02 DIAGNOSIS — I480 Paroxysmal atrial fibrillation: Secondary | ICD-10-CM

## 2014-09-02 DIAGNOSIS — I10 Essential (primary) hypertension: Secondary | ICD-10-CM

## 2014-09-02 DIAGNOSIS — E785 Hyperlipidemia, unspecified: Secondary | ICD-10-CM | POA: Diagnosis not present

## 2014-09-02 LAB — BASIC METABOLIC PANEL
BUN: 32 mg/dL — AB (ref 6–23)
CO2: 23 mEq/L (ref 19–32)
Calcium: 9.6 mg/dL (ref 8.4–10.5)
Chloride: 106 mEq/L (ref 96–112)
Creatinine, Ser: 1.92 mg/dL — ABNORMAL HIGH (ref 0.40–1.20)
GFR: 33.69 mL/min — AB (ref >60.00)
Glucose, Bld: 89 mg/dL (ref 70–99)
POTASSIUM: 4.5 meq/L (ref 3.5–5.1)
SODIUM: 138 meq/L (ref 135–145)

## 2014-09-02 LAB — LIPID PANEL
CHOL/HDL RATIO: 3
Cholesterol: 167 mg/dL (ref 0–200)
HDL: 64.5 mg/dL (ref >39.00)
LDL CALC: 82 mg/dL (ref 0–99)
NonHDL: 102.25
TRIGLYCERIDES: 102 mg/dL (ref 0.0–149.0)
VLDL: 20.4 mg/dL (ref 0.0–40.0)

## 2014-09-02 LAB — HEPATIC FUNCTION PANEL
ALBUMIN: 3.6 g/dL (ref 3.5–5.2)
ALK PHOS: 108 U/L (ref 39–117)
ALT: 12 U/L (ref 0–35)
AST: 19 U/L (ref 0–37)
Bilirubin, Direct: 0.2 mg/dL (ref 0.0–0.3)
TOTAL PROTEIN: 6.5 g/dL (ref 6.0–8.3)
Total Bilirubin: 0.7 mg/dL (ref 0.2–1.2)

## 2014-09-02 MED ORDER — ATORVASTATIN CALCIUM 20 MG PO TABS: 20.0000 mg | ORAL_TABLET | Freq: Every day | ORAL | Status: DC

## 2014-09-02 NOTE — Telephone Encounter (Signed)
-----   Message from Sueanne Margarita, MD sent at 09/02/2014  3:10 PM EDT ----- LDL not at goal - increase Lipitor to 20mg  daily and recheck FLP and ALT In 6 weeks

## 2014-09-02 NOTE — Telephone Encounter (Signed)
Informed patient of results and verbal understanding expressed.  Instructed patient to INCREASE LIPITOR to 20 mg daily. FLP and ALT scheduled for October 6. Patient agrees with treatment plan.

## 2014-09-08 ENCOUNTER — Other Ambulatory Visit: Payer: Self-pay

## 2014-09-08 NOTE — Patient Outreach (Signed)
[place] [facility]) Care Management  09/08/2014  Debbie [name] 1950-07-26 [id number]   Telephonic Care Management Note  Referral Date: 09/01/14 Referral Source:  MD Referral Issue:  CHF; H/O 2 admissions and 2 ED visits over the past year.   Triage outreach call # 1 to patient.  Patient not reached.  RN CM left voice message with name and contact #.  Plan: RN CM will follow-up with next contact call within one week.   Mariann Laster, RN, BSN, [facility], CCM  [employer] Management Coordinator [phone] Direct [phone] Cell [phone] Office [phone] Fax

## 2014-09-09 ENCOUNTER — Encounter (HOSPITAL_COMMUNITY): Payer: Medicare Other

## 2014-09-10 ENCOUNTER — Other Ambulatory Visit: Payer: Medicare Other

## 2014-09-10 NOTE — Patient Outreach (Signed)
[facility]) Care Management  09/10/2014  Debbie [name] 10-15-1950 [id number]   Telephonic Care Management Note  Referral Date: 09/01/14 Referral Source: MD Referral Issue: CHF; H/O 2 admissions and 2 ED visits over the past year.   Triage outreach call # 2 to patient at [phone]  Patient not reached.  RN CM left voice message with name and contact #.  Plan: RN CM will follow-up with next contact call within one week.   Mariann Laster, RN, BSN, [facility], CCM  [employer] Management Coordinator [phone] Direct [phone] Cell [phone] Office [phone] Fax

## 2014-09-16 ENCOUNTER — Other Ambulatory Visit: Payer: Self-pay

## 2014-09-16 NOTE — Patient Outreach (Signed)
[facility]) Care Management  09/16/2014  Debbie [name] August 29, 1950 [id number]   Telephonic Care Management Note  Referral Date: 09/01/14 Referral Source: MD Referral Issue: CHF; H/O 2 admissions and 2 ED visits over the past year.   Triage outreach call # 3 to patient at [phone] Patient not reached.  RN CM left HIPAA compliant voice message with name and contact #.  Plan: RN CM will send unsuccessful outreach letter to patient.  RN CM will follow-up with next contact call within 1-2  weeks.   Mariann Laster, RN, BSN, [facility], CCM  [employer] Management Coordinator [phone] Direct [phone] Cell [phone] Office [phone] Fax

## 2014-09-23 ENCOUNTER — Other Ambulatory Visit: Payer: Self-pay

## 2014-09-23 DIAGNOSIS — I509 Heart failure, unspecified: Secondary | ICD-10-CM

## 2014-09-23 NOTE — Patient Outreach (Signed)
[facility]) Care Management  09/23/2014  Debbie [name] January 02, 1951 [id number]   Request from Mariann Laster, RN to assign Pharmacy, SW, and Community Nursing, assigned Nicoletta Ba, PharmD (for [name], PharmD), Nat Christen, LCSW (for [name], LCSW) and Erenest Rasher, RN.  Thanks, Ronnell Freshwater. [place], [place] Assistant Phone: [phone] Fax: [phone]

## 2014-09-23 NOTE — Patient Outreach (Signed)
[place] [facility]) Care Management  09/23/2014  RAJAH LAMBA 1950/10/11 [id number]   Telephonic Care Management Screening Note  Referral Date: 09/01/14 Referral Source: MD Referral Issue: CHF; H/O 2 admissions and 2 ED visits over the past year.  PCP:  Dr. Jeneen Rinks [place]: Missed 08/2014 appointment - next appointment unknown  Cardiologist:  Dr. Alfonzo Feller -  Last appointment 1 month ago approximately 07/2014.  Insurance:  Medicare and Medicare Part D  Outreach call #4 to patient.  Patient reached.    Social:   Patient lives in her home with her husband.  Husband is currently in the SNF/Rehab for amputation rehab and scheduled for discharge back to home any day.  Falls:  None over the past year.  Patient ambulates with cane.  States she is not able to walk long distances and uses her husband's W/C while he is in the SNF.  Caregiver:  3 children living in [place].   DME:  Cane. W/C while husband is in the nursing home amputation - coming home soon.  Patient does not have any scales in the home to monitor weight for CHF management.  Transportation:  Children when able to get off work and misses / cancels appointment's when children are not available.  Admissions:   Patient states reason for past admissions associated to "Cracked Knee & chest pain" States her bed is high and she cracked her knee getting into the bed.    THN Condition:  CHF Patient states she does not understand CHF and asked RN CM to explain the symptoms.  Patient is not weighing and states she was not aware she should be weighing.   Medications:  States she takes about 11 medications.  States she takes as ordered and only misses doses when her pharmacy needs to call and get refill orders from her MD.  States she is on a automation system with [employer].  States she has gone several days without meds when this has occurred.   Consent: Patient consents to [facility] services:  Patient agreed to [facility] RN CM and SW services.  RN CM advised in next contact call within 10 business days to schedule appointment.  RN CM advised to please notify MD of any changes in condition prior to scheduled appointment's.  RN CM provided contact name and #, 24-hour nurse line # [phone].   RN CM confirmed patient is aware of 911 services for urgent emergency needs.  Plan RN CM sent referral for [facility] Community RN CM services -CHF management -no scales.  -PCP appointment  -Possible need for patient to have own W/C rather than using her husbands.   RN CM sent SW referral -Astronomer CM sent Pharmacy referral  -More than 10 medications.   -Please assess reason patient is missing dosages during time to get medications refilled.   RN CM notified [facility]. CM start date:  09/23/2014   Please schedule calls with patient's needs as she is expecting husband to be discharged from nursing home any day.   Mariann Laster, RN, BSN, [facility], CCM  [employer] Management Coordinator [phone] Direct [phone] Cell [phone] Office [phone] Fax

## 2014-09-23 NOTE — Addendum Note (Signed)
Addended by: Standley Brooking on: 09/23/2014 05:48 PM   Modules accepted: Orders

## 2014-09-24 ENCOUNTER — Other Ambulatory Visit: Payer: Self-pay | Admitting: Pharmacist

## 2014-09-24 NOTE — Patient Outreach (Signed)
[facility]) Care Management  [place]   09/24/2014  Debbie [name] 1950-03-22 [id number]  Subjective: Debbie [name] is a 64 y.o. female who was referred to [place] for a medication review.  It was also noted that the patient reports that sometimes she will miss her medications when the pharmacy has to call the physician's office to get refills as she has to wait for the physician's office to respond to the request.   Objective:   Current Medications: Current Outpatient Prescriptions  Medication Sig Dispense Refill  . acetaminophen (TYLENOL) 500 MG tablet Take 1,000 mg by mouth every 6 (six) hours as needed for moderate pain (pain).    Marland Kitchen amLODipine (NORVASC) 5 MG tablet Take 1 tablet (5 mg total) by mouth daily. 30 tablet 1  . aspirin EC 81 MG tablet Take 81 mg by mouth every evening.     Marland Kitchen atorvastatin (LIPITOR) 20 MG tablet Take 1 tablet (20 mg total) by mouth daily. 30 tablet 11  . bimatoprost (LUMIGAN) 0.01 % SOLN Place 1 drop into both eyes at bedtime.     . calcitRIOL (ROCALTROL) 0.25 MCG capsule Take 0.25 mcg by mouth daily. Take .25 mcg - .60mcg by mouth daily, alternating with .5 mcg.c    . cyclobenzaprine (FLEXERIL) 10 MG tablet Take 10 mg by mouth 3 (three) times daily as needed for muscle spasms (muscle spasms).    . furosemide (LASIX) 20 MG tablet Take 20 mg by mouth daily. Excess edema    . isosorbide dinitrate (ISORDIL) 10 MG tablet TAKE 1 TABLET BY MOUTH THREE TIMES DAILY 270 tablet 3  . labetalol (NORMODYNE) 200 MG tablet Take 100 mg by mouth 2 (two) times daily.     Marland Kitchen MAGNESIUM-OXIDE 400 (241.3 MG) MG tablet TAKE 1 TABLET BY MOUTH TWICE DAILY 60 tablet 0  . mycophenolate (MYFORTIC) 180 MG EC tablet Take 180 mg by mouth 2 (two) times daily.     . nitroGLYCERIN (NITROSTAT) 0.4 MG SL tablet Place 0.4 mg under the tongue every 5 (five) minutes as needed for chest pain.     Marland Kitchen oxyCODONE-acetaminophen (PERCOCET/ROXICET) 5-325 MG per tablet Take  1 tablet by mouth every 4 (four) hours as needed for severe pain. 15 tablet 0  . predniSONE (DELTASONE) 5 MG tablet Take 5 mg by mouth daily with breakfast.     . RANEXA 500 MG 12 hr tablet TAKE 1 TABLET BY MOUTH TWICE DAILY 180 tablet 3  . ranitidine (ZANTAC) 150 MG tablet Take 150 mg by mouth daily.     . sodium bicarbonate 650 MG tablet Take 1,300 mg by mouth 2 (two) times daily.     . tacrolimus (PROGRAF) 1 MG capsule Take 2 mg by mouth 2 (two) times daily.     . timolol (TIMOPTIC) 0.5 % ophthalmic solution Place 1 drop into both eyes 2 (two) times daily.     . traMADol (ULTRAM) 50 MG tablet Take 1 tablet (50 mg total) by mouth every 6 (six) hours as needed. 15 tablet 0  . warfarin (COUMADIN) 2.5 MG tablet Take 2.5 mg by mouth daily at 6 PM.      No current facility-administered medications for this visit.    Functional Status: In your present state of health, do you have any difficulty performing the following activities: 09/23/2014 05/27/2014  Hearing? N N  Vision? - N  Difficulty concentrating or making decisions? N N  Walking or climbing stairs? Aggie Moats  Dressing or bathing? - N  Doing errands, shopping? Y -    Fall/Depression Screening: No flowsheet data found.  Assessment:  Drugs sorted by system:  Neurologic/Psychologic:  Cardiovascular: amlodipine, aspirin, atorvastatin, furosemide, isosorbide dinitrate, labetalol, Ranexa, nitroglycerin, warfarin  Pulmonary/Allergy:  Gastrointestinal: ranitidine  Endocrine: none noted  Renal: calcitriol, sodium bicarbonate  Topical: none noted  Eye: bimatoprost, timolol  Pain: acetaminophen, cyclobenzaprine, oxycodone-acetaminophen, tramadol  Miscellaneous: magnesium oxide, mycophenolate, prednisone, tacrolimus   Duplications in therapy: Gaps in therapy: Patient on anticoagulant and statin. No ACEI/ARB or beta blocker gaps identified Medications to avoid in the elderly: opioids and cyclobenazprine (both of these are old  prescriptions and have not been reordered recently) Drug interactions: none noted Other issues noted: none   Plan: 1. Medication review: all medications appear to be appropriate based on patient's problem list. No recommendations for any changes.  2. Refill processing: I am unable to assist with making the physician's office respond to refill requests more quickly. The only recommendation that I have is for the patient to call in her refills earlier so that she has more time to receive them before she runs out.   Nicoletta Ba, PharmD, [facility] [phone]

## 2014-09-30 ENCOUNTER — Other Ambulatory Visit: Payer: Self-pay | Admitting: Licensed Clinical Social Worker

## 2014-09-30 NOTE — Patient Outreach (Signed)
[place] [facility]) Care Management  09/30/2014  Debbie [name] Feb 11, 1950 [id number]   Assessment-CSW attempted initial outreach to patient on 09/30/14 and was able to gain contact with patient. HIPPA verifications were received. CSW received referral due to lack of stable transportation. CSW introduced self, reason for call and of [place]. Patient reports having limited access to stable transportation but that some of her family members can transport her but "not all the time." Patient reports having up to 2-3 medical appointments per week. CSW educated patient on [employer] and [employer]. Patient reports that she is not interested in [facility] as she use to receive dialysis and noticed patients having long wait times to be transported back home. Patient was agreeable to [employer] referral but was made aware that they only will transport her to 1 medical appointment per week. Patient reported that she will use her family members to take her to the other medical appointments and that this should alleviate stress. CSW educated patient on the [employer] form that she will be getting in the mail for her to read over the first 3 pages and sign the last 2 consents for her to mail back. CSW informed her that there will be a return envelope with a stamp for her to easily mail back. CSW suggested that if [employer] seems to not be enough transportation than it might be best to consider [facility] again at that time. Patient was agreeable to stable transportation goal for care plan. CSW completed psychosocial assessment with patient and she reports no feelings of depression or hopelessness. CSW informed patient to contact CSW once [employer] application has arrived in the mail and patient agreed to do so.  Plan-CSW will make [employer] referral today on 09/30/14 and will await call back from patient.  [name], BSW, MSW, [email or web address] Phone: [phone] Fax: [phone]

## 2014-10-08 ENCOUNTER — Encounter (HOSPITAL_COMMUNITY): Payer: Self-pay | Admitting: Emergency Medicine

## 2014-10-08 ENCOUNTER — Emergency Department (HOSPITAL_COMMUNITY)
Admission: EM | Admit: 2014-10-08 | Discharge: 2014-10-08 | Disposition: A | Discharge status: Home / Self Care | Payer: Medicare Other | Attending: Emergency Medicine | Admitting: Emergency Medicine

## 2014-10-08 DIAGNOSIS — Z7982 Long term (current) use of aspirin: Secondary | ICD-10-CM | POA: Diagnosis not present

## 2014-10-08 DIAGNOSIS — N186 End stage renal disease: Secondary | ICD-10-CM | POA: Diagnosis not present

## 2014-10-08 DIAGNOSIS — Z86010 Personal history of colonic polyps: Secondary | ICD-10-CM | POA: Insufficient documentation

## 2014-10-08 DIAGNOSIS — Z992 Dependence on renal dialysis: Secondary | ICD-10-CM | POA: Diagnosis not present

## 2014-10-08 DIAGNOSIS — Z79899 Other long term (current) drug therapy: Secondary | ICD-10-CM | POA: Insufficient documentation

## 2014-10-08 DIAGNOSIS — M10071 Idiopathic gout, right ankle and foot: Secondary | ICD-10-CM | POA: Diagnosis not present

## 2014-10-08 DIAGNOSIS — R011 Cardiac murmur, unspecified: Secondary | ICD-10-CM | POA: Insufficient documentation

## 2014-10-08 DIAGNOSIS — Z880 Allergy status to penicillin: Secondary | ICD-10-CM | POA: Insufficient documentation

## 2014-10-08 DIAGNOSIS — Z8701 Personal history of pneumonia (recurrent): Secondary | ICD-10-CM | POA: Insufficient documentation

## 2014-10-08 DIAGNOSIS — I10 Essential (primary) hypertension: Secondary | ICD-10-CM

## 2014-10-08 DIAGNOSIS — Z940 Kidney transplant status: Secondary | ICD-10-CM | POA: Insufficient documentation

## 2014-10-08 DIAGNOSIS — I2510 Atherosclerotic heart disease of native coronary artery without angina pectoris: Secondary | ICD-10-CM | POA: Diagnosis not present

## 2014-10-08 DIAGNOSIS — E785 Hyperlipidemia, unspecified: Secondary | ICD-10-CM | POA: Insufficient documentation

## 2014-10-08 DIAGNOSIS — Z87891 Personal history of nicotine dependence: Secondary | ICD-10-CM | POA: Insufficient documentation

## 2014-10-08 DIAGNOSIS — H409 Unspecified glaucoma: Secondary | ICD-10-CM | POA: Insufficient documentation

## 2014-10-08 DIAGNOSIS — Z7901 Long term (current) use of anticoagulants: Secondary | ICD-10-CM | POA: Diagnosis not present

## 2014-10-08 DIAGNOSIS — Z86718 Personal history of other venous thrombosis and embolism: Secondary | ICD-10-CM | POA: Insufficient documentation

## 2014-10-08 DIAGNOSIS — I120 Hypertensive chronic kidney disease with stage 5 chronic kidney disease or end stage renal disease: Secondary | ICD-10-CM | POA: Insufficient documentation

## 2014-10-08 DIAGNOSIS — R42 Dizziness and giddiness: Secondary | ICD-10-CM | POA: Diagnosis present

## 2014-10-08 DIAGNOSIS — M109 Gout, unspecified: Secondary | ICD-10-CM

## 2014-10-08 MED ORDER — TRAMADOL HCL 50 MG PO TABS: 50.0000 mg | ORAL_TABLET | Freq: Four times a day (QID) | ORAL | Status: DC | PRN

## 2014-10-08 MED ORDER — HYDROCODONE-ACETAMINOPHEN 5-325 MG PO TABS: 1.0000 | ORAL_TABLET | ORAL | Status: DC | PRN

## 2014-10-08 NOTE — ED Provider Notes (Signed)
CSN: [id number]     Arrival date & time 10/08/14  2051 History   First MD Initiated Contact with Patient 10/08/14 2111     Chief Complaint  Patient presents with  . Hypertension  . Dizziness     (Consider location/radiation/quality/duration/timing/severity/associated sxs/prior Treatment) HPI The patient reports that she ate some macaroni and cheese that she had put salt on. Not long after she ate it, she started to get a feeling that she gets when her blood pressure goes up. She reports she started to feel kind of dizzy and like things were moving around. She then took her blood pressure and reports that it was up to the 035K systolic. She doesn't know diastolic pressure was. She reports that that dizziness only lasted about 30-40 minutes. She took a labetalol dose and her symptoms seem to be improving now. Port[place] normally she takes labetalol twice a day and so that was her evening dose. She denies she had any problems or vision. There was no associated chest pain or shortness of breath. No associated syncope. As an aside the patient also mentions that she has a spot on the side of her right foot hurts which is consistent with her gout. She reports she doesn't have a few home to take for pain with her gout. She reports it flares up periodically in either foot. Past Medical History  Diagnosis Date  . Kidney transplant as cause of abnormal reaction or later complication   . Hypertension   . Colon polyps   . Gall stones 1980  . Anti-phospholipid antibody syndrome     (Based on hospital discharge summary march 2013)  . Paroxysmal atrial fibrillation   . Carpal tunnel syndrome 2003    lt  . Esophageal stricture   . Hemorrhoids   . Renal disorder   . ESRD (end stage renal disease) on dialysis     "til I got my transplant in 2010"  . DVT (deep venous thrombosis) 1990    LLE  . History of blood transfusion     "related to the lupus"  . Dyslipidemia 07/22/2013  . Coronary artery disease   .  Heart murmur     mild MR on echo  . Pneumonia     "3 times now" (08/25/2013)  . GERD (gastroesophageal reflux disease)   . Arthritis     "fingers" (08/25/2013)  . Glaucoma, left eye   . Bilateral carotid artery stenosis     1-39%  . Pulmonary HTN     PASP 40mmHg   Past Surgical History  Procedure Laterality Date  . Nephrectomy transplanted organ  05/2008  . Cholecystectomy  1980  . Hematoma evacuation  1998    "after they took peritoneal catheter out"  . Peritoneal catheter insertion    . Peritoneal catheter removal    . Carpal tunnel release Right 07/2001  . Arteriovenous graft placement    . Thrombectomy and revision of arterioventous (av) goretex  graft Right 07/1999; 04/2002; 09/2002; 12/2005; 09/2007;     upper arm/notes 07/31/1999; 05/18/2002; 10/07/2002; 01/14/2006; 09/14/2007;   . Right heart catheterization N/A 08/28/2013    Procedure: RIGHT HEART CATH;  Surgeon: Sinclair Grooms, MD;  Location: [facility] CATH LAB;  Service: Cardiovascular;  Laterality: N/A;   Family History  Problem Relation Age of Onset  . Hypertension Mother   . Heart disease Mother   . Colon cancer Neg Hx   . Rectal cancer Neg Hx   . Stomach cancer Neg Hx  Social History  Substance Use Topics  . Smoking status: Former Smoker -- 1.00 packs/day for 30 years    Types: Cigarettes    Quit date: 04/16/1977  . Smokeless tobacco: Never Used  . Alcohol Use: Yes     Comment: "quit drinking in 1979"   OB History    No data available     Review of Systems 10 Systems reviewed and are negative for acute change except as noted in the HPI.    Allergies  Feraheme; Vancomycin; Dorzolamide hcl-timolol mal; Gentamycin; Latanoprost; Cefazolin; Codeine; Hydrocodone-acetaminophen; and Penicillins  Home Medications   Prior to Admission medications   Medication Sig Start Date End Date Taking? Authorizing Provider  acetaminophen (TYLENOL) 500 MG tablet Take 1,000 mg by mouth every 6 (six) hours as needed for moderate pain  (pain).    Historical Provider, MD  amLODipine (NORVASC) 5 MG tablet Take 1 tablet (5 mg total) by mouth daily. 09/01/13   Rise Mu, PA-C  aspirin EC 81 MG tablet Take 81 mg by mouth every evening.     Historical Provider, MD  atorvastatin (LIPITOR) 20 MG tablet Take 1 tablet (20 mg total) by mouth daily. 09/02/14   Sueanne Margarita, MD  bimatoprost (LUMIGAN) 0.01 % SOLN Place 1 drop into both eyes at bedtime.  05/21/13   Historical Provider, MD  calcitRIOL (ROCALTROL) 0.25 MCG capsule Take 0.25 mcg by mouth daily. Take .25 mcg - .12mcg by mouth daily, alternating with .5 mcg.c    Historical Provider, MD  cyclobenzaprine (FLEXERIL) 10 MG tablet Take 10 mg by mouth 3 (three) times daily as needed for muscle spasms (muscle spasms).    Historical Provider, MD  furosemide (LASIX) 20 MG tablet Take 20 mg by mouth daily. Excess edema    Historical Provider, MD  HYDROcodone-acetaminophen (NORCO/VICODIN) 5-325 MG per tablet Take 1-2 tablets by mouth every 4 (four) hours as needed for moderate pain or severe pain. 10/08/14   Charlesetta Shanks, MD  isosorbide dinitrate (ISORDIL) 10 MG tablet TAKE 1 TABLET BY MOUTH THREE TIMES DAILY 03/04/14   Erlene Quan, PA-C  labetalol (NORMODYNE) 200 MG tablet Take 100 mg by mouth 2 (two) times daily.     Historical Provider, MD  MAGNESIUM-OXIDE 400 (241.3 MG) MG tablet TAKE 1 TABLET BY MOUTH TWICE DAILY 12/29/13   Sueanne Margarita, MD  mycophenolate (MYFORTIC) 180 MG EC tablet Take 180 mg by mouth 2 (two) times daily.     Historical Provider, MD  nitroGLYCERIN (NITROSTAT) 0.4 MG SL tablet Place 0.4 mg under the tongue every 5 (five) minutes as needed for chest pain.  08/26/13   Historical Provider, MD  oxyCODONE-acetaminophen (PERCOCET/ROXICET) 5-325 MG per tablet Take 1 tablet by mouth every 4 (four) hours as needed for severe pain. 05/06/14   Doy Hutching, MD  predniSONE (DELTASONE) 5 MG tablet Take 5 mg by mouth daily with breakfast.  04/24/12   Historical Provider, MD  RANEXA 500  MG 12 hr tablet TAKE 1 TABLET BY MOUTH TWICE DAILY 11/30/13   Sueanne Margarita, MD  ranitidine (ZANTAC) 150 MG tablet Take 150 mg by mouth daily.     Historical Provider, MD  sodium bicarbonate 650 MG tablet Take 1,300 mg by mouth 2 (two) times daily.  07/13/11   Historical Provider, MD  tacrolimus (PROGRAF) 1 MG capsule Take 2 mg by mouth 2 (two) times daily.     Historical Provider, MD  timolol (TIMOPTIC) 0.5 % ophthalmic solution Place 1 drop into  both eyes 2 (two) times daily.  05/21/13   Historical Provider, MD  traMADol (ULTRAM) 50 MG tablet Take 1 tablet (50 mg total) by mouth every 6 (six) hours as needed. 04/08/14   Baron Sane, PA-C  warfarin (COUMADIN) 2.5 MG tablet Take 2.5 mg by mouth daily at 6 PM.  07/10/11   Historical Provider, MD   BP 160/59 mmHg  Pulse 61  Temp(Src) 97.7 F (36.5 C) (Oral)  Resp 20  SpO2 99% Physical Exam  Constitutional: She is oriented to person, place, and time. She appears well-developed and well-nourished.  Patient is alert with clear mental status.  HENT:  Head: Normocephalic and atraumatic.  Eyes: EOM are normal. Pupils are equal, round, and reactive to light.  Neck: Neck supple.  Cardiovascular: Normal rate, regular rhythm and intact distal pulses.   Patient has 2/6 systolic ejection murmur  Pulmonary/Chest: Effort normal and breath sounds normal.  Abdominal: Soft. Bowel sounds are normal. She exhibits no distension. There is no tenderness.  Musculoskeletal: Normal range of motion. She exhibits no edema.  Patient has a small focus of tenderness on the lateral right metatarsal. The foot itself is not edematous. No lower extremity edema. Skin is warm and dry.  Neurological: She is alert and oriented to person, place, and time. She has normal strength. Coordination normal. GCS eye subscore is 4. GCS verbal subscore is 5. GCS motor subscore is 6.  Skin: Skin is warm, dry and intact.  Psychiatric: She has a normal mood and affect.    ED Course   Procedures (including critical care time) Labs Review Labs Reviewed - No data to display  Imaging Review No results found. I have personally reviewed and evaluated these images and lab results as part of my medical decision-making.   EKG Interpretation   Date/Time:  Friday October 08 2014 21:00:31 EDT Ventricular Rate:  69 PR Interval:    QRS Duration: 101 QT Interval:  431 QTC Calculation: 462 R Axis:   3 Text Interpretation:  normal. no change from previous Confirmed by  Johnney Killian, MD, Jeannie Done [phone]) on 10/08/2014 10:27:30 PM      MDM   Final diagnoses:  Essential hypertension  Gout of right foot, unspecified cause, unspecified chronicity   Patient presents with chief complaint of hypertension. She had already taken her evening dose of labetalol when her pressure elevated and now is improved. She traces the blood pressure spike to dietary indiscretion. He has normal neurologic examination. Patient also incidentally notes a area of pain on her lateral foot which she reports is consistent with her gout which she reports flares up every so often. No evidence of this time of infectious etiology or other significant complication.    Charlesetta Shanks, MD 10/08/14 [phone]

## 2014-10-08 NOTE — ED Notes (Signed)
Pt arrives via EMS from home, states she has had dizziness ongoing for an hour. States this typically occurs when her BP is higher, but she states it only lasts 30-45 minutes. States still dizzy at this time. Hx dialysis, graft on R side. Also reports kidney transplant hx. Denies pain at this time.

## 2014-10-08 NOTE — Discharge Instructions (Signed)
Hypertension °Hypertension, commonly called high blood pressure, is when the force of blood pumping through your arteries is too strong. Your arteries are the blood vessels that carry blood from your heart throughout your body. A blood pressure reading consists of a higher number over a lower number, such as 110/72. The higher number (systolic) is the pressure inside your arteries when your heart pumps. The lower number (diastolic) is the pressure inside your arteries when your heart relaxes. Ideally you want your blood pressure below 120/80. °Hypertension forces your heart to work harder to pump blood. Your arteries may become narrow or stiff. Having hypertension puts you at risk for heart disease, stroke, and other problems.  °RISK FACTORS °Some risk factors for high blood pressure are controllable. Others are not.  °Risk factors you cannot control include:  °· Race. You may be at higher risk if you are African American. °· Age. Risk increases with age. °· Gender. Men are at higher risk than women before age 45 years. After age 65, women are at higher risk than men. °Risk factors you can control include: °· Not getting enough exercise or physical activity. °· Being overweight. °· Getting too much fat, sugar, calories, or salt in your diet. °· Drinking too much alcohol. °SIGNS AND SYMPTOMS °Hypertension does not usually cause signs or symptoms. Extremely high blood pressure (hypertensive crisis) may cause headache, anxiety, shortness of breath, and nosebleed. °DIAGNOSIS  °To check if you have hypertension, your health care provider will measure your blood pressure while you are seated, with your arm held at the level of your heart. It should be measured at least twice using the same arm. Certain conditions can cause a difference in blood pressure between your right and left arms. A blood pressure reading that is higher than normal on one occasion does not mean that you need treatment. If one blood pressure reading  is high, ask your health care provider about having it checked again. °TREATMENT  °Treating high blood pressure includes making lifestyle changes and possibly taking medicine. Living a healthy lifestyle can help lower high blood pressure. You may need to change some of your habits. °Lifestyle changes may include: °· Following the DASH diet. This diet is high in fruits, vegetables, and whole grains. It is low in salt, red meat, and added sugars. °· Getting at least 2½ hours of brisk physical activity every week. °· Losing weight if necessary. °· Not smoking. °· Limiting alcoholic beverages. °· Learning ways to reduce stress. ° If lifestyle changes are not enough to get your blood pressure under control, your health care provider may prescribe medicine. You may need to take more than one. Work closely with your health care provider to understand the risks and benefits. °HOME CARE INSTRUCTIONS °· Have your blood pressure rechecked as directed by your health care provider.   °· Take medicines only as directed by your health care provider. Follow the directions carefully. Blood pressure medicines must be taken as prescribed. The medicine does not work as well when you skip doses. Skipping doses also puts you at risk for problems.   °· Do not smoke.   °· Monitor your blood pressure at home as directed by your health care provider.  °SEEK MEDICAL CARE IF:  °· You think you are having a reaction to medicines taken. °· You have recurrent headaches or feel dizzy. °· You have swelling in your ankles. °· You have trouble with your vision. °SEEK IMMEDIATE MEDICAL CARE IF: °· You develop a severe headache or confusion. °·   You have unusual weakness, numbness, or feel faint.  You have severe chest or abdominal pain.  You vomit repeatedly.  You have trouble breathing. MAKE SURE YOU:   Understand these instructions.  Will watch your condition.  Will get help right away if you are not doing well or get worse. Document  Released: 01/08/2005 Document Revised: 05/25/2013 Document Reviewed: 10/31/2012 ExitCare Patient Information 2015 [employer]. This information is not intended to replace advice given to you by your health care provider. Make sure you discuss any questions you have with your health care provider. Gout Gout is an inflammatory arthritis caused by a buildup of uric acid crystals in the joints. Uric acid is a chemical that is normally present in the blood. When the level of uric acid in the blood is too high it can form crystals that deposit in your joints and tissues. This causes joint redness, soreness, and swelling (inflammation). Repeat attacks are common. Over time, uric acid crystals can form into masses (tophi) near a joint, destroying bone and causing disfigurement. Gout is treatable and often preventable. CAUSES  The disease begins with elevated levels of uric acid in the blood. Uric acid is produced by your body when it breaks down a naturally found substance called purines. Certain foods you eat, such as meats and fish, contain high amounts of purines. Causes of an elevated uric acid level include:  Being passed down from parent to child (heredity).  Diseases that cause increased uric acid production (such as obesity, psoriasis, and certain cancers).  Excessive alcohol use.  Diet, especially diets rich in meat and seafood.  Medicines, including certain cancer-fighting medicines (chemotherapy), water pills (diuretics), and aspirin.  Chronic kidney disease. The kidneys are no longer able to remove uric acid well.  Problems with metabolism. Conditions strongly associated with gout include:  Obesity.  High blood pressure.  High cholesterol.  Diabetes. Not everyone with elevated uric acid levels gets gout. It is not understood why some people get gout and others do not. Surgery, joint injury, and eating too much of certain foods are some of the factors that can lead to gout  attacks. SYMPTOMS   An attack of gout comes on quickly. It causes intense pain with redness, swelling, and warmth in a joint.  Fever can occur.  Often, only one joint is involved. Certain joints are more commonly involved:  Base of the big toe.  Knee.  Ankle.  Wrist.  Finger. Without treatment, an attack usually goes away in a few days to weeks. Between attacks, you usually will not have symptoms, which is different from many other forms of arthritis. DIAGNOSIS  Your caregiver will suspect gout based on your symptoms and exam. In some cases, tests may be recommended. The tests may include:  Blood tests.  Urine tests.  X-rays.  Joint fluid exam. This exam requires a needle to remove fluid from the joint (arthrocentesis). Using a microscope, gout is confirmed when uric acid crystals are seen in the joint fluid. TREATMENT  There are two phases to gout treatment: treating the sudden onset (acute) attack and preventing attacks (prophylaxis).  Treatment of an Acute Attack.  Medicines are used. These include anti-inflammatory medicines or steroid medicines.  An injection of steroid medicine into the affected joint is sometimes necessary.  The painful joint is rested. Movement can worsen the arthritis.  You may use warm or cold treatments on painful joints, depending which works best for you.  Treatment to Prevent Attacks.  If you   suffer from frequent gout attacks, your caregiver may advise preventive medicine. These medicines are started after the acute attack subsides. These medicines either help your kidneys eliminate uric acid from your body or decrease your uric acid production. You may need to stay on these medicines for a very long time.  The early phase of treatment with preventive medicine can be associated with an increase in acute gout attacks. For this reason, during the first few months of treatment, your caregiver may also advise you to take medicines usually used  for acute gout treatment. Be sure you understand your caregiver's directions. Your caregiver may make several adjustments to your medicine dose before these medicines are effective.  Discuss dietary treatment with your caregiver or dietitian. Alcohol and drinks high in sugar and fructose and foods such as meat, poultry, and seafood can increase uric acid levels. Your caregiver or dietitian can advise you on drinks and foods that should be limited. HOME CARE INSTRUCTIONS   Do not take aspirin to relieve pain. This raises uric acid levels.  Only take over-the-counter or prescription medicines for pain, discomfort, or fever as directed by your caregiver.  Rest the joint as much as possible. When in bed, keep sheets and blankets off painful areas.  Keep the affected joint raised (elevated).  Apply warm or cold treatments to painful joints. Use of warm or cold treatments depends on which works best for you.  Use crutches if the painful joint is in your leg.  Drink enough fluids to keep your urine clear or pale yellow. This helps your body get rid of uric acid. Limit alcohol, sugary drinks, and fructose drinks.  Follow your dietary instructions. Pay careful attention to the amount of protein you eat. Your daily diet should emphasize fruits, vegetables, whole grains, and fat-free or low-fat milk products. Discuss the use of coffee, vitamin C, and cherries with your caregiver or dietitian. These may be helpful in lowering uric acid levels.  Maintain a healthy body weight. SEEK MEDICAL CARE IF:   You develop diarrhea, vomiting, or any side effects from medicines.  You do not feel better in 24 hours, or you are getting worse. SEEK IMMEDIATE MEDICAL CARE IF:   Your joint becomes suddenly more tender, and you have chills or a fever. MAKE SURE YOU:   Understand these instructions.  Will watch your condition.  Will get help right away if you are not doing well or get worse. Document Released:  01/06/2000 Document Revised: 05/25/2013 Document Reviewed: 08/22/2011 ExitCare Patient Information 2015 [employer]. This information is not intended to replace advice given to you by your health care provider. Make sure you discuss any questions you have with your health care provider.  

## 2014-10-08 NOTE — ED Notes (Signed)
Pt does not want vicodin script

## 2014-10-14 ENCOUNTER — Other Ambulatory Visit: Payer: Self-pay | Admitting: Licensed Clinical Social Worker

## 2014-10-14 NOTE — Patient Outreach (Signed)
[facility]) Care Management  10/14/2014  Debbie [name] 09/18/1950 [id number]  Assessment-CSW contacted patient to question if [employer] application packet had been successfully mailed to her. Patient reports that her son is now taking her to all of her appointments but that she was able to set up [employer] transportation for situations when son is not able to transport her. Patient reported that she mailed consents in which was final step in gaining [employer] Patient reported that she does not need SW services at this time as she now has stable transportation with [employer], goal is met and is not interested in [employer] services at this time. CSW informed her that she will discharge her from caseload but will still be active with [facility]. Patient expressed appreciation for assistance.  Plan-CSW will inform [facility] RNCM and Pharmacist of [place] discharge.   [name], BSW, MSW, [name] Phone: [phone] Fax: [phone]

## 2014-10-25 ENCOUNTER — Other Ambulatory Visit: Payer: Self-pay | Admitting: Cardiology

## 2014-10-28 ENCOUNTER — Other Ambulatory Visit (INDEPENDENT_AMBULATORY_CARE_PROVIDER_SITE_OTHER): Payer: Medicare Other | Admitting: *Deleted

## 2014-10-28 DIAGNOSIS — E785 Hyperlipidemia, unspecified: Secondary | ICD-10-CM

## 2014-10-28 LAB — LIPID PANEL
CHOLESTEROL: 146 mg/dL (ref 125–200)
HDL: 52 mg/dL (ref >=46)
LDL Cholesterol: 78 mg/dL (ref <130)
Total CHOL/HDL Ratio: 2.8 Ratio (ref <=5.0)
Triglycerides: 81 mg/dL (ref <150)
VLDL: 16 mg/dL (ref <30)

## 2014-10-28 LAB — ALT: ALT: 16 U/L (ref 6–29)

## 2014-11-02 ENCOUNTER — Encounter (HOSPITAL_COMMUNITY)
Admission: RE | Admit: 2014-11-02 | Discharge: 2014-11-02 | Disposition: A | Discharge status: Home / Self Care | Payer: Medicare Other | Source: Ambulatory Visit | Attending: Nephrology | Admitting: Nephrology

## 2014-11-02 DIAGNOSIS — N184 Chronic kidney disease, stage 4 (severe): Secondary | ICD-10-CM | POA: Insufficient documentation

## 2014-11-02 DIAGNOSIS — D631 Anemia in chronic kidney disease: Secondary | ICD-10-CM | POA: Insufficient documentation

## 2014-11-02 DIAGNOSIS — Z79899 Other long term (current) drug therapy: Secondary | ICD-10-CM | POA: Diagnosis not present

## 2014-11-02 DIAGNOSIS — Z5181 Encounter for therapeutic drug level monitoring: Secondary | ICD-10-CM | POA: Diagnosis not present

## 2014-11-02 LAB — IRON AND TIBC
Iron: 55 ug/dL (ref 28–170)
SATURATION RATIOS: 23 % (ref 10.4–31.8)
TIBC: 238 ug/dL — ABNORMAL LOW (ref 250–450)
UIBC: 183 ug/dL

## 2014-11-02 LAB — FERRITIN: Ferritin: 749 ng/mL — ABNORMAL HIGH (ref 11–307)

## 2014-11-02 LAB — POCT HEMOGLOBIN-HEMACUE: Hemoglobin: 10.5 g/dL — ABNORMAL LOW (ref 12.0–15.0)

## 2014-11-02 MED ORDER — EPOETIN ALFA 10000 UNIT/ML IJ SOLN: 40000.0000 [IU] | INTRAMUSCULAR | Status: DC

## 2014-11-02 MED ORDER — EPOETIN ALFA 40000 UNIT/ML IJ SOLN
INTRAMUSCULAR | Status: AC
  Administered 2014-11-02: 40000 [IU] via SUBCUTANEOUS
  Filled 2014-11-02: qty 1

## 2014-11-18 ENCOUNTER — Encounter (HOSPITAL_COMMUNITY): Admission: RE | Admit: 2014-11-18 | Payer: Medicare Other | Source: Ambulatory Visit

## 2014-11-23 ENCOUNTER — Other Ambulatory Visit: Payer: Self-pay | Admitting: Cardiology

## 2014-11-25 ENCOUNTER — Encounter (HOSPITAL_COMMUNITY): Admission: RE | Admit: 2014-11-25 | Payer: Medicare Other | Source: Ambulatory Visit

## 2014-11-25 ENCOUNTER — Encounter: Payer: Self-pay | Admitting: Cardiology

## 2014-11-29 ENCOUNTER — Other Ambulatory Visit: Payer: Self-pay

## 2014-11-29 NOTE — Patient Outreach (Signed)
Spoke briefly today with patient about community care coordination and benefits offered by [place] after patient identified herself using [place]IP [place] identifiers.    Patient informed this RNCM she does not have a primary care physician, utilizes her nephrologist as her primary care physician.  Patient stated she buried her husband of many years last week, would like to talk a little more tomorrow about going about getting a primary care physician.  This RNCM and patient agreed on telephone contact tomorrow and to schedule home visit.

## 2014-11-30 ENCOUNTER — Other Ambulatory Visit: Payer: Self-pay

## 2014-11-30 NOTE — Patient Outreach (Signed)
This RNCM was successful in making telephone contact with patient to schedule home visit to assess further her community care coordination needs.  Patient provided information to satisfy [place] identifiers. Patient and this RNCM agreed to home visit on tomorrow to further assess community care coordination needs.

## 2014-12-01 ENCOUNTER — Encounter (HOSPITAL_COMMUNITY): Payer: Self-pay | Admitting: Emergency Medicine

## 2014-12-01 ENCOUNTER — Emergency Department (HOSPITAL_COMMUNITY)
Admission: EM | Admit: 2014-12-01 | Discharge: 2014-12-01 | Disposition: A | Discharge status: Home / Self Care | Payer: Medicare Other | Attending: Emergency Medicine | Admitting: Emergency Medicine

## 2014-12-01 ENCOUNTER — Other Ambulatory Visit: Payer: Self-pay

## 2014-12-01 DIAGNOSIS — Z940 Kidney transplant status: Secondary | ICD-10-CM | POA: Diagnosis not present

## 2014-12-01 DIAGNOSIS — H409 Unspecified glaucoma: Secondary | ICD-10-CM | POA: Insufficient documentation

## 2014-12-01 DIAGNOSIS — Z7901 Long term (current) use of anticoagulants: Secondary | ICD-10-CM | POA: Diagnosis not present

## 2014-12-01 DIAGNOSIS — Z8701 Personal history of pneumonia (recurrent): Secondary | ICD-10-CM | POA: Diagnosis not present

## 2014-12-01 DIAGNOSIS — R011 Cardiac murmur, unspecified: Secondary | ICD-10-CM | POA: Diagnosis not present

## 2014-12-01 DIAGNOSIS — I120 Hypertensive chronic kidney disease with stage 5 chronic kidney disease or end stage renal disease: Secondary | ICD-10-CM | POA: Insufficient documentation

## 2014-12-01 DIAGNOSIS — K219 Gastro-esophageal reflux disease without esophagitis: Secondary | ICD-10-CM | POA: Diagnosis not present

## 2014-12-01 DIAGNOSIS — N186 End stage renal disease: Secondary | ICD-10-CM | POA: Diagnosis not present

## 2014-12-01 DIAGNOSIS — M1990 Unspecified osteoarthritis, unspecified site: Secondary | ICD-10-CM | POA: Insufficient documentation

## 2014-12-01 DIAGNOSIS — Z79899 Other long term (current) drug therapy: Secondary | ICD-10-CM | POA: Diagnosis not present

## 2014-12-01 DIAGNOSIS — R42 Dizziness and giddiness: Secondary | ICD-10-CM | POA: Diagnosis not present

## 2014-12-01 DIAGNOSIS — Z880 Allergy status to penicillin: Secondary | ICD-10-CM | POA: Insufficient documentation

## 2014-12-01 DIAGNOSIS — I480 Paroxysmal atrial fibrillation: Secondary | ICD-10-CM | POA: Insufficient documentation

## 2014-12-01 DIAGNOSIS — Z992 Dependence on renal dialysis: Secondary | ICD-10-CM | POA: Diagnosis not present

## 2014-12-01 DIAGNOSIS — Z7982 Long term (current) use of aspirin: Secondary | ICD-10-CM | POA: Insufficient documentation

## 2014-12-01 DIAGNOSIS — Z87891 Personal history of nicotine dependence: Secondary | ICD-10-CM | POA: Insufficient documentation

## 2014-12-01 DIAGNOSIS — I2510 Atherosclerotic heart disease of native coronary artery without angina pectoris: Secondary | ICD-10-CM | POA: Diagnosis not present

## 2014-12-01 DIAGNOSIS — E785 Hyperlipidemia, unspecified: Secondary | ICD-10-CM | POA: Diagnosis not present

## 2014-12-01 DIAGNOSIS — Z86010 Personal history of colonic polyps: Secondary | ICD-10-CM | POA: Diagnosis not present

## 2014-12-01 DIAGNOSIS — Z86718 Personal history of other venous thrombosis and embolism: Secondary | ICD-10-CM | POA: Diagnosis not present

## 2014-12-01 LAB — I-STAT TROPONIN, ED: Troponin i, poc: 0.02 ng/mL (ref 0.00–0.08)

## 2014-12-01 LAB — CBC WITH DIFFERENTIAL/PLATELET
BASOS PCT: 0 %
Basophils Absolute: 0 10*3/uL (ref 0.0–0.1)
Eosinophils Absolute: 0 10*3/uL (ref 0.0–0.7)
Eosinophils Relative: 0 %
HEMATOCRIT: 33.7 % — AB (ref 36.0–46.0)
Hemoglobin: 10.6 g/dL — ABNORMAL LOW (ref 12.0–15.0)
LYMPHS ABS: 2.3 10*3/uL (ref 0.7–4.0)
Lymphocytes Relative: 34 %
MCH: 26.6 pg (ref 26.0–34.0)
MCHC: 31.5 g/dL (ref 30.0–36.0)
MCV: 84.5 fL (ref 78.0–100.0)
MONO ABS: 0.5 10*3/uL (ref 0.1–1.0)
MONOS PCT: 7 %
NEUTROS ABS: 4.1 10*3/uL (ref 1.7–7.7)
Neutrophils Relative %: 59 %
Platelets: 165 10*3/uL (ref 150–400)
RBC: 3.99 MIL/uL (ref 3.87–5.11)
RDW: 16.3 % — AB (ref 11.5–15.5)
WBC: 7 10*3/uL (ref 4.0–10.5)

## 2014-12-01 LAB — BASIC METABOLIC PANEL
Anion gap: 9 (ref 5–15)
BUN: 36 mg/dL — AB (ref 6–20)
CALCIUM: 10.1 mg/dL (ref 8.9–10.3)
CHLORIDE: 107 mmol/L (ref 101–111)
CO2: 21 mmol/L — AB (ref 22–32)
CREATININE: 2.57 mg/dL — AB (ref 0.44–1.00)
GFR calc non Af Amer: 18 mL/min — ABNORMAL LOW (ref >60)
GFR, EST AFRICAN AMERICAN: 21 mL/min — AB (ref >60)
GLUCOSE: 107 mg/dL — AB (ref 65–99)
Potassium: 5.2 mmol/L — ABNORMAL HIGH (ref 3.5–5.1)
Sodium: 137 mmol/L (ref 135–145)

## 2014-12-01 MED ORDER — LABETALOL HCL 100 MG PO TABS
100.0000 mg | ORAL_TABLET | Freq: Once | ORAL | Status: AC
  Administered 2014-12-01: 100 mg via ORAL
  Filled 2014-12-01: qty 1

## 2014-12-01 MED ORDER — SODIUM CHLORIDE 0.9 % IV BOLUS (SEPSIS)
500.0000 mL | Freq: Once | INTRAVENOUS | Status: AC
  Administered 2014-12-01: 500 mL via INTRAVENOUS

## 2014-12-01 NOTE — ED Notes (Signed)
Pt. reports dizziness and generalized weakness onset this morning , alert and oriented , denies pain / respirations unlabored .

## 2014-12-01 NOTE — Discharge Instructions (Signed)
Please read and follow all provided instructions.  Your diagnoses today include:  1. Dizziness     Tests performed today include:  Blood counts and electrolytes - slightly high potassium of 5.2 and unchanged high creatinine (kidney function) of 2.5. These levels should be re-checked by your doctor in 1 week.   EKG - no problems  Vital signs. See below for your results today.   Medications prescribed:   None  Take any prescribed medications only as directed.  Home care instructions:  Follow any educational materials contained in this packet.  BE VERY CAREFUL not to take multiple medicines containing Tylenol (also called acetaminophen). Doing so can lead to an overdose which can damage your liver and cause liver failure and possibly death.   Follow-up instructions: Please follow-up with your primary care provider in the next 3 days for further evaluation of your symptoms.   Return instructions:  SEEK IMMEDIATE MEDICAL ATTENTION IF:  There is confusion or drowsiness.   You have more than one episode of vomiting.   You notice dizziness or unsteadiness which is getting worse, or inability to walk.   You have convulsions or unconsciousness.   You experience severe, persistent headaches not relieved by Tylenol.  You cannot use arms or legs normally.   There are changes in pupil sizes. (This is the black center in the colored part of the eye)   You have change in speech, vision, swallowing, or understanding.   Localized weakness, numbness, tingling, or change in bowel or bladder control.  You have any other emergent concerns.   Your vital signs today were: BP 184/77 mmHg   Pulse 65   Temp(Src) 98.1 F (36.7 C) (Oral)   Resp 20   Ht 5\' 1"  (1.549 m)   Wt 146 lb (66.225 kg)   BMI 27.60 kg/m2   SpO2 99% If your blood pressure (BP) was elevated above 135/85 this visit, please have this repeated by your doctor within one month. --------------

## 2014-12-01 NOTE — ED Provider Notes (Signed)
CSN: [id number]     Arrival date & time 12/01/14  0604 History   First MD Initiated Contact with Patient 12/01/14 0606     Chief Complaint  Patient presents with  . Dizziness     (Consider location/radiation/quality/duration/timing/severity/associated sxs/prior Treatment) HPI Comments: Patient with history of ESRD, s/p renal transplant -- presents with complaint of dizziness described as a spinning sensation. Symptoms started after patient awoke this morning shortly before 5 AM. Patient states that she turned her head to the right and she felt a severe spinning sensation like "I was on a merry-go-round". She did not have any other symptoms including weakness in her arms or her legs, slurred speech or facial droop, difficulty speaking. Patient was able to get up and dress herself and walk without difficulty. She did not feel lightheaded with standing. She was transported to the hospital by her sister. No treatments prior to arrival. The symptoms are now resolved. Patient has had no associated headache, vision change or loss, neck pain, chest pain, shortness of breath, abdominal pain, vomiting, diarrhea, urinary symptoms. Patient has had similar symptoms in the past with vertigo. Patient states that she thinks that her red blood cell count is low. At last check, she had a hemoglobin of 10 and she states that her doctors would like it at 21. Onset of symptoms acute. Course is resolved. Nothing makes symptoms better. Movement of the head makes the symptoms worse.  Patient is a 64 y.o. female presenting with dizziness. The history is provided by the patient.  Dizziness Associated symptoms: no chest pain, no diarrhea, no headaches, no nausea, no shortness of breath, no vomiting and no weakness     Past Medical History  Diagnosis Date  . Kidney transplant as cause of abnormal reaction or later complication   . Hypertension   . Colon polyps   . Gall stones 1980  . Anti-phospholipid antibody syndrome  [facility])     (Based on hospital discharge summary march 2013)  . Paroxysmal atrial fibrillation (HCC)   . Carpal tunnel syndrome 2003    lt  . Esophageal stricture   . Hemorrhoids   . Renal disorder   . ESRD (end stage renal disease) on dialysis [facility])     "til I got my transplant in 2010"  . DVT (deep venous thrombosis) (HCC) 1990    LLE  . History of blood transfusion     "related to the lupus"  . Dyslipidemia 07/22/2013  . Coronary artery disease   . Heart murmur     mild MR on echo  . Pneumonia     "3 times now" (08/25/2013)  . GERD (gastroesophageal reflux disease)   . Arthritis     "fingers" (08/25/2013)  . Glaucoma, left eye   . Bilateral carotid artery stenosis     1-39%  . Pulmonary HTN (HCC)     PASP 11mmHg   Past Surgical History  Procedure Laterality Date  . Nephrectomy transplanted organ  05/2008  . Cholecystectomy  1980  . Hematoma evacuation  1998    "after they took peritoneal catheter out"  . Peritoneal catheter insertion    . Peritoneal catheter removal    . Carpal tunnel release Right 07/2001  . Arteriovenous graft placement    . Thrombectomy and revision of arterioventous (av) goretex  graft Right 07/1999; 04/2002; 09/2002; 12/2005; 09/2007;     upper arm/notes 07/31/1999; 05/18/2002; 10/07/2002; 01/14/2006; 09/14/2007;   . Right heart catheterization N/A 08/28/2013    Procedure: RIGHT  HEART CATH;  Surgeon: Sinclair Grooms, MD;  Location: [facility] CATH LAB;  Service: Cardiovascular;  Laterality: N/A;   Family History  Problem Relation Age of Onset  . Hypertension Mother   . Heart disease Mother   . Colon cancer Neg Hx   . Rectal cancer Neg Hx   . Stomach cancer Neg Hx    Social History  Substance Use Topics  . Smoking status: Former Smoker -- 0.00 packs/day for 30 years    Types: Cigarettes    Quit date: 04/16/1977  . Smokeless tobacco: Never Used  . Alcohol Use: Yes   OB History    No data available     Review of Systems  Constitutional: Negative for fever.   HENT: Negative for congestion, dental problem, rhinorrhea, sinus pressure and sore throat.   Eyes: Negative for photophobia, discharge, redness and visual disturbance.  Respiratory: Negative for cough and shortness of breath.   Cardiovascular: Negative for chest pain.  Gastrointestinal: Negative for nausea, vomiting, abdominal pain and diarrhea.  Genitourinary: Negative for dysuria.  Musculoskeletal: Negative for myalgias, gait problem, neck pain and neck stiffness.  Skin: Negative for rash.  Neurological: Positive for dizziness. Negative for syncope, facial asymmetry, speech difficulty, weakness, light-headedness, numbness and headaches.  Psychiatric/Behavioral: Negative for confusion.    Allergies  Feraheme; Vancomycin; Dorzolamide hcl-timolol mal; Gentamycin; Latanoprost; Cefazolin; Codeine; Hydrocodone-acetaminophen; and Penicillins  Home Medications   Prior to Admission medications   Medication Sig Start Date End Date Taking? Authorizing Provider  acetaminophen (TYLENOL) 500 MG tablet Take 1,000 mg by mouth every 6 (six) hours as needed for moderate pain (pain).    Historical Provider, MD  amLODipine (NORVASC) 5 MG tablet Take 1 tablet (5 mg total) by mouth daily. 09/01/13   Rise Mu, PA-C  aspirin EC 81 MG tablet Take 81 mg by mouth every evening.     Historical Provider, MD  atorvastatin (LIPITOR) 20 MG tablet Take 1 tablet (20 mg total) by mouth daily. 09/02/14   Sueanne Margarita, MD  bimatoprost (LUMIGAN) 0.01 % SOLN Place 1 drop into both eyes at bedtime.  05/21/13   Historical Provider, MD  calcitRIOL (ROCALTROL) 0.25 MCG capsule Take 0.25 mcg by mouth daily. Take .25 mcg - .13mcg by mouth daily, alternating with .5 mcg.c    Historical Provider, MD  cyclobenzaprine (FLEXERIL) 10 MG tablet Take 10 mg by mouth 3 (three) times daily as needed for muscle spasms (muscle spasms).    Historical Provider, MD  furosemide (LASIX) 20 MG tablet Take 20 mg by mouth daily. Excess edema     Historical Provider, MD  HYDROcodone-acetaminophen (NORCO/VICODIN) 5-325 MG per tablet Take 1-2 tablets by mouth every 4 (four) hours as needed for moderate pain or severe pain. 10/08/14   Charlesetta Shanks, MD  isosorbide dinitrate (ISORDIL) 10 MG tablet TAKE 1 TABLET BY MOUTH THREE TIMES DAILY 03/04/14   Erlene Quan, PA-C  labetalol (NORMODYNE) 200 MG tablet Take 100 mg by mouth 2 (two) times daily.     Historical Provider, MD  MAGNESIUM-OXIDE 400 (241.3 MG) MG tablet TAKE 1 TABLET BY MOUTH TWICE DAILY 10/26/14   Sueanne Margarita, MD  mycophenolate (MYFORTIC) 180 MG EC tablet Take 180 mg by mouth 2 (two) times daily.     Historical Provider, MD  nitroGLYCERIN (NITROSTAT) 0.4 MG SL tablet Place 0.4 mg under the tongue every 5 (five) minutes as needed for chest pain.  08/26/13   Historical Provider, MD  oxyCODONE-acetaminophen (PERCOCET/ROXICET) 5-325  MG per tablet Take 1 tablet by mouth every 4 (four) hours as needed for severe pain. 05/06/14   Doy Hutching, MD  predniSONE (DELTASONE) 5 MG tablet Take 5 mg by mouth daily with breakfast.  04/24/12   Historical Provider, MD  RANEXA 500 MG 12 hr tablet TAKE 1 TABLET BY MOUTH TWICE DAILY 11/30/13   Sueanne Margarita, MD  ranitidine (ZANTAC) 150 MG tablet Take 150 mg by mouth daily.     Historical Provider, MD  sodium bicarbonate 650 MG tablet Take 1,300 mg by mouth 2 (two) times daily.  07/13/11   Historical Provider, MD  tacrolimus (PROGRAF) 1 MG capsule Take 2 mg by mouth 2 (two) times daily.     Historical Provider, MD  timolol (TIMOPTIC) 0.5 % ophthalmic solution Place 1 drop into both eyes 2 (two) times daily.  05/21/13   Historical Provider, MD  traMADol (ULTRAM) 50 MG tablet Take 1 tablet (50 mg total) by mouth every 6 (six) hours as needed. 04/08/14   Jennifer Piepenbrink, PA-C  traMADol (ULTRAM) 50 MG tablet Take 1 tablet (50 mg total) by mouth every 6 (six) hours as needed. 10/08/14   Charlesetta Shanks, MD  warfarin (COUMADIN) 2.5 MG tablet Take 2.5 mg by mouth  daily at 6 PM.  07/10/11   Historical Provider, MD   BP 124/77 mmHg  Pulse 64  Temp(Src) 98.1 F (36.7 C) (Oral)  Resp 20  SpO2 100%   Physical Exam  Constitutional: She is oriented to person, place, and time. She appears well-developed and well-nourished.  HENT:  Head: Normocephalic and atraumatic.  Right Ear: Tympanic membrane, external ear and ear canal normal.  Left Ear: Tympanic membrane, external ear and ear canal normal.  Nose: Nose normal.  Mouth/Throat: Uvula is midline, oropharynx is clear and moist and mucous membranes are normal.  Eyes: Conjunctivae, EOM and lids are normal. Pupils are equal, round, and reactive to light. Right eye exhibits no discharge. Left eye exhibits no discharge. Right eye exhibits no nystagmus. Left eye exhibits no nystagmus.  Nystagmus to right  Neck: Normal range of motion. Neck supple.  Cardiovascular: Normal rate, regular rhythm and normal heart sounds.   Pulmonary/Chest: Effort normal and breath sounds normal.  Abdominal: Soft. There is no tenderness.  Musculoskeletal:       Cervical back: She exhibits normal range of motion, no tenderness and no bony tenderness.  Neurological: She is alert and oriented to person, place, and time. She has normal strength and normal reflexes. No cranial nerve deficit or sensory deficit. She displays a negative Romberg sign. Coordination and gait normal. GCS eye subscore is 4. GCS verbal subscore is 5. GCS motor subscore is 6.  Skin: Skin is warm and dry.  Psychiatric: She has a normal mood and affect.  Nursing note and vitals reviewed.   ED Course  Procedures (including critical care time) Labs Review Labs Reviewed  CBC WITH DIFFERENTIAL/PLATELET - Abnormal; Notable for the following:    Hemoglobin 10.6 (*)    HCT 33.7 (*)    RDW 16.3 (*)    All other components within normal limits  BASIC METABOLIC PANEL - Abnormal; Notable for the following:    Potassium 5.2 (*)    CO2 21 (*)    Glucose, Bld 107 (*)     BUN 36 (*)    Creatinine, Ser 2.57 (*)    GFR calc non Af Amer 18 (*)    GFR calc Af Amer 21 (*)  All other components within normal limits  I-STAT TROPOININ, ED    Imaging Review No results found. I have personally reviewed and evaluated these images and lab results as part of my medical decision-making.   EKG Interpretation   Date/Time:  Wednesday December 01 2014 06:17:54 EST Ventricular Rate:  68 PR Interval:  152 QRS Duration: 101 QT Interval:  442 QTC Calculation: 470 R Axis:   32 Text Interpretation:  Sinus rhythm Baseline wander in lead(s) V6 no  significant change since Sept 2016 Confirmed by Regenia Skeeter  MD, SCOTT ([id number])  on 12/01/2014 6:24:44 AM       6:24 AM Patient seen and examined. Work-up initiated, EKG reviewed. Patient currently asymptomatic. No signs on exam of stroke. Discussed with Dr. Regenia Skeeter who will see.   Vital signs reviewed and are as follows: BP 124/77 mmHg  Pulse 64  Temp(Src) 98.1 F (36.7 C) (Oral)  Resp 20  SpO2 100%  7:54 AM Labs reviewed with patient. Discussed K=5.2 and creatinine 2.5 and that patient should have these monitored/rechecked by PCP. Will give 500cc bolus and have patient ambulate. Anticipate d/c to home if doing well.   9:06 AM Patient ambulated well. D/c to home.   Patient counseled to return if they have weakness in their arms or legs, slurred speech, trouble walking or talking, confusion, trouble with their balance, or if they have any other concerns. Patient verbalizes understanding and agrees with plan.    MDM   Final diagnoses:  Dizziness   Patient with transient vertigo symptoms without other focal neurological deficits this morning. Symptoms now resolved. No chest pain or shortness of breath. Do not suspect CVA, especially as patient was able to ambulate and dress herself without difficulty after symptoms began. This is likely an isolated attack upper for vertigo however patient counseled to return with  recurrent symptoms, inability or difficulty walking, or any new neurological problems.    Carlisle Cater, PA-C 12/01/14 [id number]  Sherwood Gambler, MD 12/05/14 [phone]

## 2014-12-02 ENCOUNTER — Other Ambulatory Visit: Payer: Self-pay

## 2014-12-02 NOTE — Patient Outreach (Signed)
[facility]) Care Management  12/02/2014  Debbie [name] 02/21/1950 [id number]  Telephone call received from patient who provided HIPPA identifiers by giving date of birth and address. Patient stated the purpose of her call was to get more information regarding [facility].  This RNCM reviewed [facility] Information packet with patient via telephone.  Patient stated interest and agreed to meet with this [facility] tomorrow, November 10 instead of today.

## 2014-12-06 ENCOUNTER — Other Ambulatory Visit: Payer: Self-pay

## 2014-12-06 NOTE — Patient Outreach (Addendum)
Applications for medicaid and EBT completed with assistance with this RNCM and mailed as both applications completed during this home visit.  Discharge visit scheduled for 12/8  Patient is in the process of selecting a primary care physician.  States the delay with making the decision is due to the recent death of her husband.

## 2014-12-21 ENCOUNTER — Ambulatory Visit (HOSPITAL_COMMUNITY)
Admission: RE | Admit: 2014-12-21 | Discharge: 2014-12-21 | Disposition: A | Discharge status: Home / Self Care | Payer: Medicare Other | Source: Ambulatory Visit | Attending: Nephrology | Admitting: Nephrology

## 2014-12-21 DIAGNOSIS — D631 Anemia in chronic kidney disease: Secondary | ICD-10-CM | POA: Diagnosis not present

## 2014-12-21 DIAGNOSIS — Z79899 Other long term (current) drug therapy: Secondary | ICD-10-CM | POA: Insufficient documentation

## 2014-12-21 DIAGNOSIS — N184 Chronic kidney disease, stage 4 (severe): Secondary | ICD-10-CM | POA: Diagnosis not present

## 2014-12-21 DIAGNOSIS — Z5181 Encounter for therapeutic drug level monitoring: Secondary | ICD-10-CM | POA: Diagnosis not present

## 2014-12-21 DIAGNOSIS — D509 Iron deficiency anemia, unspecified: Secondary | ICD-10-CM | POA: Diagnosis not present

## 2014-12-21 LAB — IRON AND TIBC
IRON: 25 ug/dL — AB (ref 28–170)
Saturation Ratios: 11 % (ref 10.4–31.8)
TIBC: 224 ug/dL — ABNORMAL LOW (ref 250–450)
UIBC: 199 ug/dL

## 2014-12-21 LAB — FERRITIN: FERRITIN: 707 ng/mL — AB (ref 11–307)

## 2014-12-21 LAB — POCT HEMOGLOBIN-HEMACUE: Hemoglobin: 9.2 g/dL — ABNORMAL LOW (ref 12.0–15.0)

## 2014-12-21 MED ORDER — EPOETIN ALFA 40000 UNIT/ML IJ SOLN
INTRAMUSCULAR | Status: AC
  Administered 2014-12-21: 40000 [IU] via SUBCUTANEOUS
  Filled 2014-12-21: qty 1

## 2014-12-21 MED ORDER — EPOETIN ALFA 10000 UNIT/ML IJ SOLN: 40000.0000 [IU] | INTRAMUSCULAR | Status: DC

## 2014-12-21 MED ORDER — SODIUM CHLORIDE 0.9 % IV SOLN
300.0000 mg | Freq: Once | INTRAVENOUS | Status: AC
  Administered 2014-12-21: 300 mg via INTRAVENOUS
  Filled 2014-12-21: qty 15

## 2014-12-27 ENCOUNTER — Other Ambulatory Visit (HOSPITAL_COMMUNITY): Payer: Self-pay | Admitting: *Deleted

## 2014-12-28 ENCOUNTER — Ambulatory Visit (HOSPITAL_COMMUNITY): Admission: RE | Admit: 2014-12-28 | Payer: Medicare Other | Source: Ambulatory Visit

## 2014-12-30 ENCOUNTER — Other Ambulatory Visit: Payer: Self-pay

## 2015-01-03 ENCOUNTER — Other Ambulatory Visit (HOSPITAL_COMMUNITY): Payer: Self-pay | Admitting: *Deleted

## 2015-01-04 ENCOUNTER — Encounter (HOSPITAL_COMMUNITY)
Admission: RE | Admit: 2015-01-04 | Discharge: 2015-01-04 | Disposition: A | Discharge status: Home / Self Care | Payer: Medicare Other | Source: Ambulatory Visit | Attending: Nephrology | Admitting: Nephrology

## 2015-01-04 DIAGNOSIS — Z79899 Other long term (current) drug therapy: Secondary | ICD-10-CM | POA: Diagnosis not present

## 2015-01-04 DIAGNOSIS — D631 Anemia in chronic kidney disease: Secondary | ICD-10-CM | POA: Diagnosis not present

## 2015-01-04 DIAGNOSIS — N184 Chronic kidney disease, stage 4 (severe): Secondary | ICD-10-CM | POA: Insufficient documentation

## 2015-01-04 DIAGNOSIS — Z5181 Encounter for therapeutic drug level monitoring: Secondary | ICD-10-CM | POA: Diagnosis not present

## 2015-01-04 LAB — POCT HEMOGLOBIN-HEMACUE: Hemoglobin: 10.3 g/dL — ABNORMAL LOW (ref 12.0–15.0)

## 2015-01-04 MED ORDER — SODIUM CHLORIDE 0.9 % IV SOLN
300.0000 mg | Freq: Once | INTRAVENOUS | Status: AC
  Administered 2015-01-04: 300 mg via INTRAVENOUS
  Filled 2015-01-04: qty 15

## 2015-01-04 MED ORDER — EPOETIN ALFA 10000 UNIT/ML IJ SOLN: 40000.0000 [IU] | INTRAMUSCULAR | Status: DC

## 2015-01-04 MED ORDER — EPOETIN ALFA 40000 UNIT/ML IJ SOLN
INTRAMUSCULAR | Status: AC
  Administered 2015-01-04: 40000 [IU]
  Filled 2015-01-04: qty 1

## 2015-01-10 ENCOUNTER — Other Ambulatory Visit: Payer: Self-pay | Admitting: Cardiology

## 2015-01-11 ENCOUNTER — Ambulatory Visit (HOSPITAL_COMMUNITY): Payer: Medicare Other | Attending: Nephrology

## 2015-01-13 ENCOUNTER — Other Ambulatory Visit: Payer: Self-pay

## 2015-01-14 NOTE — Patient Outreach (Signed)
Contact made with patient to follow up with care management goals. Patient identified herself using HIPPA identifiers by providing date of birth and address.  Patient states she is not receiving her husbands benefits and has increased her monthly income to over $1000. Patient advised that she would not be eligible for medicaid benefits due to her income. Patient also stated she is now getting EBT benefits and is very happy. Patient states she was very appreciative of all that [facility] has done for her.  Patient acknowledges having [facility] contact information and understands the referral process.  Plan: Discharge patient from my caseload as her care management goals have been met. Send patient and primary physician discharge letters:

## 2015-01-23 ENCOUNTER — Emergency Department (HOSPITAL_COMMUNITY): Payer: Medicare Other

## 2015-01-23 ENCOUNTER — Emergency Department (HOSPITAL_COMMUNITY)
Admission: EM | Admit: 2015-01-23 | Discharge: 2015-01-23 | Disposition: A | Discharge status: Home / Self Care | Payer: Medicare Other | Attending: Emergency Medicine | Admitting: Emergency Medicine

## 2015-01-23 DIAGNOSIS — Z87891 Personal history of nicotine dependence: Secondary | ICD-10-CM | POA: Diagnosis not present

## 2015-01-23 DIAGNOSIS — H409 Unspecified glaucoma: Secondary | ICD-10-CM | POA: Insufficient documentation

## 2015-01-23 DIAGNOSIS — I2510 Atherosclerotic heart disease of native coronary artery without angina pectoris: Secondary | ICD-10-CM | POA: Diagnosis not present

## 2015-01-23 DIAGNOSIS — Z940 Kidney transplant status: Secondary | ICD-10-CM | POA: Diagnosis not present

## 2015-01-23 DIAGNOSIS — Y9289 Other specified places as the place of occurrence of the external cause: Secondary | ICD-10-CM | POA: Insufficient documentation

## 2015-01-23 DIAGNOSIS — I120 Hypertensive chronic kidney disease with stage 5 chronic kidney disease or end stage renal disease: Secondary | ICD-10-CM | POA: Diagnosis not present

## 2015-01-23 DIAGNOSIS — Z86718 Personal history of other venous thrombosis and embolism: Secondary | ICD-10-CM | POA: Diagnosis not present

## 2015-01-23 DIAGNOSIS — Z880 Allergy status to penicillin: Secondary | ICD-10-CM | POA: Diagnosis not present

## 2015-01-23 DIAGNOSIS — Z79899 Other long term (current) drug therapy: Secondary | ICD-10-CM | POA: Diagnosis not present

## 2015-01-23 DIAGNOSIS — I480 Paroxysmal atrial fibrillation: Secondary | ICD-10-CM | POA: Insufficient documentation

## 2015-01-23 DIAGNOSIS — Z7952 Long term (current) use of systemic steroids: Secondary | ICD-10-CM | POA: Insufficient documentation

## 2015-01-23 DIAGNOSIS — Z862 Personal history of diseases of the blood and blood-forming organs and certain disorders involving the immune mechanism: Secondary | ICD-10-CM | POA: Insufficient documentation

## 2015-01-23 DIAGNOSIS — S82431A Displaced oblique fracture of shaft of right fibula, initial encounter for closed fracture: Secondary | ICD-10-CM | POA: Diagnosis not present

## 2015-01-23 DIAGNOSIS — Z86010 Personal history of colonic polyps: Secondary | ICD-10-CM | POA: Diagnosis not present

## 2015-01-23 DIAGNOSIS — S8251XA Displaced fracture of medial malleolus of right tibia, initial encounter for closed fracture: Secondary | ICD-10-CM

## 2015-01-23 DIAGNOSIS — S99921A Unspecified injury of right foot, initial encounter: Secondary | ICD-10-CM | POA: Diagnosis present

## 2015-01-23 DIAGNOSIS — Y9389 Activity, other specified: Secondary | ICD-10-CM | POA: Insufficient documentation

## 2015-01-23 DIAGNOSIS — M19049 Primary osteoarthritis, unspecified hand: Secondary | ICD-10-CM | POA: Diagnosis not present

## 2015-01-23 DIAGNOSIS — Z8701 Personal history of pneumonia (recurrent): Secondary | ICD-10-CM | POA: Insufficient documentation

## 2015-01-23 DIAGNOSIS — Y998 Other external cause status: Secondary | ICD-10-CM | POA: Diagnosis not present

## 2015-01-23 DIAGNOSIS — K219 Gastro-esophageal reflux disease without esophagitis: Secondary | ICD-10-CM | POA: Diagnosis not present

## 2015-01-23 DIAGNOSIS — S82831A Other fracture of upper and lower end of right fibula, initial encounter for closed fracture: Secondary | ICD-10-CM | POA: Diagnosis not present

## 2015-01-23 DIAGNOSIS — Z8639 Personal history of other endocrine, nutritional and metabolic disease: Secondary | ICD-10-CM | POA: Diagnosis not present

## 2015-01-23 DIAGNOSIS — W010XXA Fall on same level from slipping, tripping and stumbling without subsequent striking against object, initial encounter: Secondary | ICD-10-CM | POA: Diagnosis not present

## 2015-01-23 DIAGNOSIS — Z8669 Personal history of other diseases of the nervous system and sense organs: Secondary | ICD-10-CM | POA: Diagnosis not present

## 2015-01-23 DIAGNOSIS — S82401A Unspecified fracture of shaft of right fibula, initial encounter for closed fracture: Secondary | ICD-10-CM

## 2015-01-23 DIAGNOSIS — Z7901 Long term (current) use of anticoagulants: Secondary | ICD-10-CM | POA: Diagnosis not present

## 2015-01-23 DIAGNOSIS — Z7982 Long term (current) use of aspirin: Secondary | ICD-10-CM | POA: Insufficient documentation

## 2015-01-23 DIAGNOSIS — N186 End stage renal disease: Secondary | ICD-10-CM | POA: Diagnosis not present

## 2015-01-23 DIAGNOSIS — R011 Cardiac murmur, unspecified: Secondary | ICD-10-CM | POA: Insufficient documentation

## 2015-01-23 MED ORDER — TRAMADOL HCL 50 MG PO TABS: 50.0000 mg | ORAL_TABLET | Freq: Four times a day (QID) | ORAL | Status: DC | PRN

## 2015-01-23 MED ORDER — OXYCODONE-ACETAMINOPHEN 5-325 MG PO TABS: 1.0000 | ORAL_TABLET | ORAL | Status: DC | PRN

## 2015-01-23 MED ORDER — TRAMADOL HCL 50 MG PO TABS
50.0000 mg | ORAL_TABLET | Freq: Once | ORAL | Status: AC
  Administered 2015-01-23: 50 mg via ORAL
  Filled 2015-01-23: qty 1

## 2015-01-23 MED ORDER — METHOCARBAMOL 500 MG PO TABS: 500.0000 mg | ORAL_TABLET | Freq: Two times a day (BID) | ORAL | Status: DC

## 2015-01-23 NOTE — Discharge Instructions (Signed)
1. Medications: usual home medications 2. Treatment: rest, ice, elevate, wear splint and do not weight bear on right leg, drink plenty of fluids 3. Follow Up: please followup with Orthopedics for discussion of your diagnoses and further evaluation after today's visit (call their office on Tuesday to schedule appointment to be seen); if you do not have a primary care doctor use the resource guide provided to find one; please return to the ER for severe pain, numbness, weakness, new or worsening symptoms   Ankle Fracture A fracture is a break in a bone. The ankle joint is made up of three bones. These include the lower (distal)sections of your lower leg bones, called the tibia and fibula, along with a bone in your foot, called the talus. Depending on how bad the break is and if more than one ankle joint bone is broken, a cast or splint is used to protect and keep your injured bone from moving while it heals. Sometimes, surgery is required to help the fracture heal properly.  There are two general types of fractures:  Stable fracture. This includes a single fracture line through one bone, with no injury to ankle ligaments. A fracture of the talus that does not have any displacement (movement of the bone on either side of the fracture line) is also stable.  Unstable fracture. This includes more than one fracture line through one or more bones in the ankle joint. It also includes fractures that have displacement of the bone on either side of the fracture line. CAUSES  A direct blow to the ankle.   Quickly and severely twisting your ankle.  Trauma, such as a car accident or falling from a significant height. RISK FACTORS You may be at a higher risk of ankle fracture if:  You have certain medical conditions.  You are involved in high-impact sports.  You are involved in a high-impact car accident. SIGNS AND SYMPTOMS   Tender and swollen ankle.  Bruising around the injured ankle.  Pain on  movement of the ankle.  Difficulty walking or putting weight on the ankle.  A cold foot below the site of the ankle injury. This can occur if the blood vessels passing through your injured ankle were also damaged.  Numbness in the foot below the site of the ankle injury. DIAGNOSIS  An ankle fracture is usually diagnosed with a physical exam and X-rays. A CT scan may also be required for complex fractures. TREATMENT  Stable fractures are treated with a cast or splint and using crutches to avoid putting weight on your injured ankle. This is followed by an ankle strengthening program. Some patients require a special type of cast, depending on other medical problems they may have. Unstable fractures require surgery to ensure the bones heal properly. Your health care provider will tell you what type of fracture you have and the best treatment for your condition. HOME CARE INSTRUCTIONS   Review correct crutch use with your health care provider and use your crutches as directed. Safe use of crutches is extremely important. Misuse of crutches can cause you to fall or cause injury to nerves in your hands or armpits.  Do not put weight or pressure on the injured ankle until directed by your health care provider.  To lessen the swelling, keep the injured leg elevated while sitting or lying down.  Apply ice to the injured area:  Put ice in a plastic bag.  Place a towel between your cast and the bag.  Leave  the ice on for 20 minutes, 2-3 times a day.  If you have a plaster or fiberglass cast:  Do not try to scratch the skin under the cast with any objects. This can increase your risk of skin infection.  Check the skin around the cast every day. You may put lotion on any red or sore areas.  Keep your cast dry and clean.  If you have a plaster splint:  Wear the splint as directed.  You may loosen the elastic around the splint if your toes become numb, tingle, or turn cold or blue.  Do not  put pressure on any part of your cast or splint; it may break. Rest your cast only on a pillow the first 24 hours until it is fully hardened.  Your cast or splint can be protected during bathing with a plastic bag sealed to your skin with medical tape. Do not lower the cast or splint into water.  Take medicines as directed by your health care provider. Only take over-the-counter or prescription medicines for pain, discomfort, or fever as directed by your health care provider.  Do not drive a vehicle until your health care provider specifically tells you it is safe to do so.  If your health care provider has given you a follow-up appointment, it is very important to keep that appointment. Not keeping the appointment could result in a chronic or permanent injury, pain, and disability. If you have any problem keeping the appointment, call the facility for assistance. SEEK MEDICAL CARE IF: You develop increased swelling or discomfort. SEEK IMMEDIATE MEDICAL CARE IF:   Your cast gets damaged or breaks.  You have continued severe pain.  You develop new pain or swelling after the cast was put on.  Your skin or toenails below the injury turn blue or gray.  Your skin or toenails below the injury feel cold, numb, or have loss of sensitivity to touch.  There is a bad smell or pus draining from under the cast. MAKE SURE YOU:   Understand these instructions.  Will watch your condition.  Will get help right away if you are not doing well or get worse.   This information is not intended to replace advice given to you by your health care provider. Make sure you discuss any questions you have with your health care provider.   Document Released: 01/06/2000 Document Revised: 01/13/2013 Document Reviewed: 08/07/2012 Elsevier Interactive Patient Education 2016 [place] or Splint Care Casts and splints support injured limbs and keep bones from moving while they heal. It is important to  care for your cast or splint at home.  HOME CARE INSTRUCTIONS  Keep the cast or splint uncovered during the drying period. It can take 24 to 48 hours to dry if it is made of plaster. A fiberglass cast will dry in less than 1 hour.  Do not rest the cast on anything harder than a pillow for the first 24 hours.  Do not put weight on your injured limb or apply pressure to the cast until your health care provider gives you permission.  Keep the cast or splint dry. Wet casts or splints can lose their shape and may not support the limb as well. A wet cast that has lost its shape can also create harmful pressure on your skin when it dries. Also, wet skin can become infected.  Cover the cast or splint with a plastic bag when bathing or when out in the rain or  snow. If the cast is on the trunk of the body, take sponge baths until the cast is removed.  If your cast does become wet, dry it with a towel or a blow dryer on the cool setting only.  Keep your cast or splint clean. Soiled casts may be wiped with a moistened cloth.  Do not place any hard or soft foreign objects under your cast or splint, such as cotton, toilet paper, lotion, or powder.  Do not try to scratch the skin under the cast with any object. The object could get stuck inside the cast. Also, scratching could lead to an infection. If itching is a problem, use a blow dryer on a cool setting to relieve discomfort.  Do not trim or cut your cast or remove padding from inside of it.  Exercise all joints next to the injury that are not immobilized by the cast or splint. For example, if you have a long leg cast, exercise the hip joint and toes. If you have an arm cast or splint, exercise the shoulder, elbow, thumb, and fingers.  Elevate your injured arm or leg on 1 or 2 pillows for the first 1 to 3 days to decrease swelling and pain.It is best if you can comfortably elevate your cast so it is higher than your heart. SEEK MEDICAL CARE IF:    Your cast or splint cracks.  Your cast or splint is too tight or too loose.  You have unbearable itching inside the cast.  Your cast becomes wet or develops a soft spot or area.  You have a bad smell coming from inside your cast.  You get an object stuck under your cast.  Your skin around the cast becomes red or raw.  You have new pain or worsening pain after the cast has been applied. SEEK IMMEDIATE MEDICAL CARE IF:   You have fluid leaking through the cast.  You are unable to move your fingers or toes.  You have discolored (blue or white), cool, painful, or very swollen fingers or toes beyond the cast.  You have tingling or numbness around the injured area.  You have severe pain or pressure under the cast.  You have any difficulty with your breathing or have shortness of breath.  You have chest pain.   This information is not intended to replace advice given to you by your health care provider. Make sure you discuss any questions you have with your health care provider.   Document Released: 01/06/2000 Document Revised: 10/29/2012 Document Reviewed: 07/17/2012 Elsevier Interactive Patient Education [employer].

## 2015-01-23 NOTE — ED Notes (Signed)
Awake. Verbally responsive. A/O x4. Resp even and unlabored. No audible adventitious breath sounds noted. ABC's intact.  

## 2015-01-23 NOTE — ED Provider Notes (Signed)
Called by patient's family member, who states patient is requesting something stronger for pain and something for muscle spasms (discharged with tramadol, which has not been effective). Advised I would write prescription for pain medication and muscle relaxant for patient to pick up in ED at the charge nurse desk.     Marella Chimes, PA-C 01/23/15 [id number]  April Palumbo, MD 01/24/15 [phone]

## 2015-01-23 NOTE — ED Provider Notes (Signed)
CSN: [id number]     Arrival date & time 01/23/15  0435 History   First MD Initiated Contact with Patient 01/23/15 0606     Chief Complaint  Patient presents with  . Foot Injury    HPI   Debbie [name] is a 65 y.o. female with a PMH of HTN, HLD, paroxysmal atrial fibrillation on coumadin, ESRD, DVT who presents to the ED with right foot and ankle pain, which started this morning prior to arrival after slipping and falling in the bathroom. She denies hitting her head, LOC, or additional injury. She reports numbness. She denies weakness or paresthesia. She reports movement exacerbates her pain. She has not tried anything for symptom relief.  Past Medical History  Diagnosis Date  . Kidney transplant as cause of abnormal reaction or later complication   . Hypertension   . Colon polyps   . Gall stones 1980  . Anti-phospholipid antibody syndrome [facility])     (Based on hospital discharge summary march 2013)  . Paroxysmal atrial fibrillation (HCC)   . Carpal tunnel syndrome 2003    lt  . Esophageal stricture   . Hemorrhoids   . Renal disorder   . ESRD (end stage renal disease) on dialysis [facility])     "til I got my transplant in 2010"  . DVT (deep venous thrombosis) (HCC) 1990    LLE  . History of blood transfusion     "related to the lupus"  . Dyslipidemia 07/22/2013  . Coronary artery disease   . Heart murmur     mild MR on echo  . Pneumonia     "3 times now" (08/25/2013)  . GERD (gastroesophageal reflux disease)   . Arthritis     "fingers" (08/25/2013)  . Glaucoma, left eye   . Bilateral carotid artery stenosis     1-39%  . Pulmonary HTN (HCC)     PASP 46mmHg   Past Surgical History  Procedure Laterality Date  . Nephrectomy transplanted organ  05/2008  . Cholecystectomy  1980  . Hematoma evacuation  1998    "after they took peritoneal catheter out"  . Peritoneal catheter insertion    . Peritoneal catheter removal    . Carpal tunnel release Right 07/2001  . Arteriovenous graft  placement    . Thrombectomy and revision of arterioventous (av) goretex  graft Right 07/1999; 04/2002; 09/2002; 12/2005; 09/2007;     upper arm/notes 07/31/1999; 05/18/2002; 10/07/2002; 01/14/2006; 09/14/2007;   . Right heart catheterization N/A 08/28/2013    Procedure: RIGHT HEART CATH;  Surgeon: Sinclair Grooms, MD;  Location: [facility] CATH LAB;  Service: Cardiovascular;  Laterality: N/A;   Family History  Problem Relation Age of Onset  . Hypertension Mother   . Heart disease Mother   . Colon cancer Neg Hx   . Rectal cancer Neg Hx   . Stomach cancer Neg Hx    Social History  Substance Use Topics  . Smoking status: Former Smoker -- 0.00 packs/day for 30 years    Types: Cigarettes    Quit date: 04/16/1977  . Smokeless tobacco: Never Used  . Alcohol Use: Yes   OB History    No data available      Review of Systems  Musculoskeletal: Positive for arthralgias.  Neurological: Negative for syncope, weakness and numbness.  All other systems reviewed and are negative.     Allergies  Feraheme; Vancomycin; Dorzolamide hcl-timolol mal; Gentamycin; Latanoprost; Cefazolin; Codeine; Hydrocodone-acetaminophen; and Penicillins  Home Medications   Prior  to Admission medications   Medication Sig Start Date End Date Taking? Authorizing Provider  acetaminophen (TYLENOL) 500 MG tablet Take 1,000 mg by mouth every 6 (six) hours as needed for moderate pain (pain).   Yes Historical Provider, MD  allopurinol (ZYLOPRIM) 100 MG tablet Take 200 mg by mouth 2 (two) times daily. 01/10/15  Yes Historical Provider, MD  aspirin EC 81 MG tablet Take 81 mg by mouth every evening.    Yes Historical Provider, MD  bimatoprost (LUMIGAN) 0.01 % SOLN Place 1 drop into both eyes at bedtime.  05/21/13  Yes Historical Provider, MD  Brinzolamide-Brimonidine (SIMBRINZA) 1-0.2 % SUSP Place 1 drop into the left eye 3 (three) times daily. 09/14/14  Yes Historical Provider, MD  calcitRIOL (ROCALTROL) 0.25 MCG capsule Take 0.25-0.5 mcg  by mouth daily. Take .25 mcg - .38mcg by mouth daily, alternating with .5 mcg.c   Yes Historical Provider, MD  furosemide (LASIX) 20 MG tablet Take 20 mg by mouth daily. Excess edema   Yes Historical Provider, MD  isosorbide dinitrate (ISORDIL) 10 MG tablet TAKE 1 TABLET BY MOUTH THREE TIMES DAILY 03/04/14  Yes Doreene Burke Kilroy, PA-C  labetalol (NORMODYNE) 200 MG tablet Take 100 mg by mouth 2 (two) times daily.    Yes Historical Provider, MD  MAGNESIUM-OXIDE 400 (241.3 MG) MG tablet TAKE 1 TABLET BY MOUTH TWICE DAILY 10/26/14  Yes Sueanne Margarita, MD  mycophenolate (MYFORTIC) 180 MG EC tablet Take 180 mg by mouth 2 (two) times daily.    Yes Historical Provider, MD  nitroGLYCERIN (NITROSTAT) 0.4 MG SL tablet Place 0.4 mg under the tongue every 5 (five) minutes as needed for chest pain.  08/26/13  Yes Historical Provider, MD  predniSONE (DELTASONE) 5 MG tablet Take 5 mg by mouth daily with breakfast.  04/24/12  Yes Historical Provider, MD  RANEXA 500 MG 12 hr tablet TAKE 1 TABLET BY MOUTH TWICE DAILY 01/11/15  Yes Sueanne Margarita, MD  ranitidine (ZANTAC) 150 MG tablet Take 150 mg by mouth daily.    Yes Historical Provider, MD  sodium bicarbonate 650 MG tablet Take 1,300 mg by mouth 2 (two) times daily.  07/13/11  Yes Historical Provider, MD  tacrolimus (PROGRAF) 1 MG capsule Take 2 mg by mouth 2 (two) times daily.    Yes Historical Provider, MD  timolol (TIMOPTIC) 0.5 % ophthalmic solution Place 1 drop into both eyes 2 (two) times daily.  05/21/13  Yes Historical Provider, MD  warfarin (COUMADIN) 2.5 MG tablet Take 2.5 mg by mouth daily at 6 PM. Monday and Friday 2.5 mg and 1.25 mg on all other days 07/10/11  Yes Historical Provider, MD  traMADol (ULTRAM) 50 MG tablet Take 1 tablet (50 mg total) by mouth every 6 (six) hours as needed. 01/23/15   Guadelupe Sabin Namiyah Grantham, PA-C    BP 167/69 mmHg  Pulse 69  Temp(Src) 98.1 F (36.7 C) (Oral)  Resp 18  SpO2 99% Physical Exam  Constitutional: She is oriented to person,  place, and time. She appears well-developed and well-nourished. No distress.  HENT:  Head: Normocephalic and atraumatic.  Right Ear: External ear normal.  Left Ear: External ear normal.  Nose: Nose normal.  Mouth/Throat: Uvula is midline, oropharynx is clear and moist and mucous membranes are normal.  Eyes: Conjunctivae, EOM and lids are normal. Pupils are equal, round, and reactive to light. Right eye exhibits no discharge. Left eye exhibits no discharge. No scleral icterus.  Neck: Normal range of motion. Neck supple.  Cardiovascular: Normal rate, regular rhythm, normal heart sounds, intact distal pulses and normal pulses.   Pulmonary/Chest: Effort normal and breath sounds normal. No respiratory distress. She has no wheezes. She has no rales.  Abdominal: Soft. Normal appearance and bowel sounds are normal. She exhibits no distension and no mass. There is no tenderness. There is no rigidity, no rebound and no guarding.  Musculoskeletal: She exhibits edema and tenderness.  Diffuse TTP of right distal leg and ankle with mild edema, decreased ROM, and decreased strength due to pain. Sensation to light touch intact. Distal pulses intact.  Neurological: She is alert and oriented to person, place, and time. No sensory deficit.  Decreased strength of RLE due to pain. Sensation to light touch intact.  Skin: Skin is warm, dry and intact. No rash noted. She is not diaphoretic. No erythema. No pallor.  Psychiatric: She has a normal mood and affect. Her speech is normal and behavior is normal.  Nursing note and vitals reviewed.   ED Course  Procedures (including critical care time)  Labs Review Labs Reviewed - No data to display  Imaging Review Dg Ankle Complete Right  01/23/2015  CLINICAL DATA:  Status post fall, with twisting injury to the right foot. Right medial malleolar pain and swelling. Initial encounter. EXAM: RIGHT ANKLE - COMPLETE 3+ VIEW COMPARISON:  Right foot radiographs performed  03/13/2010 FINDINGS: There is an oblique fracture through the distal fibular diaphysis, and an oblique medial malleolar fracture, both demonstrating mild displacement. Diffuse surrounding soft tissue swelling is noted. There is mild associated talar tilt on the oblique view. The interosseous space is within normal limits. No talar tilt or subluxation is seen. Small plantar and posterior calcaneal spurs are seen. The joint spaces are preserved. Diffuse vascular calcifications are seen. IMPRESSION: 1. Oblique fracture through the distal fibular diaphysis, and oblique medial malleolar fracture, both demonstrating mild displacement. Mild associated talar tilt on the oblique view. 2. Diffuse vascular calcifications seen. Electronically Signed   By: Garald Balding M.D.   On: 01/23/2015 06:30   Dg Foot Complete Right  01/23/2015  CLINICAL DATA:  Status post fall, with right medial malleolar pain and swelling. Initial encounter. EXAM: RIGHT FOOT COMPLETE - 3+ VIEW COMPARISON:  None. FINDINGS: Fractures of the distal fibula and medial malleolus are better characterized on concurrent ankle radiographs. There is mild chronic deformity involving the proximal fourth and fifth metatarsals. Visualized joint spaces are preserved. Diffuse vascular calcifications are seen. Soft tissue swelling is noted about the ankle. IMPRESSION: Fractures of the distal fibula and medial malleolus are better characterized on concurrent ankle radiographs. Diffuse vascular calcifications seen. Electronically Signed   By: Garald Balding M.D.   On: 01/23/2015 06:31   I have personally reviewed and evaluated these images as part of my medical decision-making.   EKG Interpretation None      MDM   Final diagnoses:  Right fibular fracture, closed, initial encounter  Fractured medial malleolus, right, closed, initial encounter    65 year old female presents with right ankle injury after slipping and falling earlier today prior to arrival.  Denies hitting her head, LOC, or additional injury. Reports numbness. Denies weakness or paresthesia.  Patient is afebrile. Vital signs stable. Head normocephalic and atraumatic. Diffuse TTP of right distal leg and ankle with mild edema, decreased ROM, and decreased strength due to pain. Sensation to light touch intact. Distal pulses intact.   Patient declines pain medication.  Imaging remarkable for oblique fracture through the distal fibular  diaphysis and oblique medial malleolar fracture, both demonstrating mild displacement. Patient refusing head CT.  Orthopedics consulted. Spoke with Dr. Ninfa Linden, who advised putting patient in posterior splint with stirrups, have her non-weight bearing, and call the office Tuesday to make an appointment for further evaluation and management of injury. Recommended rest, ice, and elevation. Will also give short course of pain medication. Discussed this with patient, who is in agreement with plan. Patient is non-toxic and well-appearing, feel she is stable for discharge at this time. Return precautions discussed.  Patient discussed with and seen by Dr. Randal Buba.  BP 167/69 mmHg  Pulse 69  Temp(Src) 98.1 F (36.7 C) (Oral)  Resp 18  SpO2 99%       Marella Chimes, PA-C 01/23/15 1129  April Palumbo, MD 01/24/15 [phone]

## 2015-01-23 NOTE — ED Notes (Signed)
Ortho at bedside.

## 2015-01-23 NOTE — ED Notes (Addendum)
Per EMS, pt was In bathroom when she tripped and fell. C/o right ankle pain, edema, possible deformity. No LOC or head injury reported- denies any other pain.

## 2015-01-23 NOTE — ED Notes (Signed)
Bed: WA07 Expected date:  Expected time:  Means of arrival: Ambulance Comments: EMS ankle pain

## 2015-02-14 ENCOUNTER — Emergency Department (HOSPITAL_COMMUNITY): Payer: Medicare Other

## 2015-02-14 ENCOUNTER — Inpatient Hospital Stay (HOSPITAL_COMMUNITY)
Admission: EM | Admit: 2015-02-14 | Discharge: 2015-02-21 | DRG: 698 | Disposition: A | Discharge status: Home Health | Payer: Medicare Other | Attending: Internal Medicine | Admitting: Internal Medicine

## 2015-02-14 ENCOUNTER — Encounter (HOSPITAL_COMMUNITY): Payer: Self-pay | Admitting: Emergency Medicine

## 2015-02-14 DIAGNOSIS — D631 Anemia in chronic kidney disease: Secondary | ICD-10-CM | POA: Diagnosis present

## 2015-02-14 DIAGNOSIS — K219 Gastro-esophageal reflux disease without esophagitis: Secondary | ICD-10-CM | POA: Diagnosis present

## 2015-02-14 DIAGNOSIS — I447 Left bundle-branch block, unspecified: Secondary | ICD-10-CM | POA: Diagnosis present

## 2015-02-14 DIAGNOSIS — M329 Systemic lupus erythematosus, unspecified: Secondary | ICD-10-CM | POA: Diagnosis present

## 2015-02-14 DIAGNOSIS — N179 Acute kidney failure, unspecified: Secondary | ICD-10-CM | POA: Diagnosis not present

## 2015-02-14 DIAGNOSIS — I429 Cardiomyopathy, unspecified: Secondary | ICD-10-CM | POA: Diagnosis present

## 2015-02-14 DIAGNOSIS — Z86718 Personal history of other venous thrombosis and embolism: Secondary | ICD-10-CM

## 2015-02-14 DIAGNOSIS — Z86711 Personal history of pulmonary embolism: Secondary | ICD-10-CM

## 2015-02-14 DIAGNOSIS — S82841A Displaced bimalleolar fracture of right lower leg, initial encounter for closed fracture: Secondary | ICD-10-CM

## 2015-02-14 DIAGNOSIS — I480 Paroxysmal atrial fibrillation: Secondary | ICD-10-CM | POA: Diagnosis present

## 2015-02-14 DIAGNOSIS — I272 Other secondary pulmonary hypertension: Secondary | ICD-10-CM | POA: Diagnosis present

## 2015-02-14 DIAGNOSIS — Z7901 Long term (current) use of anticoagulants: Secondary | ICD-10-CM

## 2015-02-14 DIAGNOSIS — N184 Chronic kidney disease, stage 4 (severe): Secondary | ICD-10-CM | POA: Diagnosis present

## 2015-02-14 DIAGNOSIS — D6859 Other primary thrombophilia: Secondary | ICD-10-CM | POA: Diagnosis present

## 2015-02-14 DIAGNOSIS — N189 Chronic kidney disease, unspecified: Secondary | ICD-10-CM

## 2015-02-14 DIAGNOSIS — E860 Dehydration: Secondary | ICD-10-CM

## 2015-02-14 DIAGNOSIS — Z7952 Long term (current) use of systemic steroids: Secondary | ICD-10-CM

## 2015-02-14 DIAGNOSIS — I5043 Acute on chronic combined systolic (congestive) and diastolic (congestive) heart failure: Secondary | ICD-10-CM | POA: Diagnosis not present

## 2015-02-14 DIAGNOSIS — Z885 Allergy status to narcotic agent status: Secondary | ICD-10-CM

## 2015-02-14 DIAGNOSIS — I509 Heart failure, unspecified: Secondary | ICD-10-CM

## 2015-02-14 DIAGNOSIS — E785 Hyperlipidemia, unspecified: Secondary | ICD-10-CM | POA: Diagnosis present

## 2015-02-14 DIAGNOSIS — J9601 Acute respiratory failure with hypoxia: Secondary | ICD-10-CM | POA: Diagnosis not present

## 2015-02-14 DIAGNOSIS — I10 Essential (primary) hypertension: Secondary | ICD-10-CM | POA: Diagnosis present

## 2015-02-14 DIAGNOSIS — Z880 Allergy status to penicillin: Secondary | ICD-10-CM

## 2015-02-14 DIAGNOSIS — I5021 Acute systolic (congestive) heart failure: Secondary | ICD-10-CM | POA: Diagnosis present

## 2015-02-14 DIAGNOSIS — Z6827 Body mass index (BMI) 27.0-27.9, adult: Secondary | ICD-10-CM

## 2015-02-14 DIAGNOSIS — I130 Hypertensive heart and chronic kidney disease with heart failure and stage 1 through stage 4 chronic kidney disease, or unspecified chronic kidney disease: Secondary | ICD-10-CM | POA: Diagnosis present

## 2015-02-14 DIAGNOSIS — E43 Unspecified severe protein-calorie malnutrition: Secondary | ICD-10-CM | POA: Diagnosis present

## 2015-02-14 DIAGNOSIS — Z452 Encounter for adjustment and management of vascular access device: Secondary | ICD-10-CM

## 2015-02-14 DIAGNOSIS — R42 Dizziness and giddiness: Secondary | ICD-10-CM | POA: Diagnosis not present

## 2015-02-14 DIAGNOSIS — R8271 Bacteriuria: Secondary | ICD-10-CM | POA: Diagnosis present

## 2015-02-14 DIAGNOSIS — E46 Unspecified protein-calorie malnutrition: Secondary | ICD-10-CM | POA: Diagnosis present

## 2015-02-14 DIAGNOSIS — E8809 Other disorders of plasma-protein metabolism, not elsewhere classified: Secondary | ICD-10-CM | POA: Diagnosis present

## 2015-02-14 DIAGNOSIS — Z87891 Personal history of nicotine dependence: Secondary | ICD-10-CM

## 2015-02-14 DIAGNOSIS — I25119 Atherosclerotic heart disease of native coronary artery with unspecified angina pectoris: Secondary | ICD-10-CM | POA: Diagnosis present

## 2015-02-14 DIAGNOSIS — T8619 Other complication of kidney transplant: Principal | ICD-10-CM | POA: Diagnosis present

## 2015-02-14 DIAGNOSIS — S82841D Displaced bimalleolar fracture of right lower leg, subsequent encounter for closed fracture with routine healing: Secondary | ICD-10-CM

## 2015-02-14 DIAGNOSIS — I214 Non-ST elevation (NSTEMI) myocardial infarction: Secondary | ICD-10-CM | POA: Diagnosis present

## 2015-02-14 DIAGNOSIS — Z79899 Other long term (current) drug therapy: Secondary | ICD-10-CM

## 2015-02-14 DIAGNOSIS — N183 Chronic kidney disease, stage 3 unspecified: Secondary | ICD-10-CM | POA: Diagnosis present

## 2015-02-14 DIAGNOSIS — T8610 Unspecified complication of kidney transplant: Secondary | ICD-10-CM | POA: Diagnosis present

## 2015-02-14 DIAGNOSIS — H409 Unspecified glaucoma: Secondary | ICD-10-CM | POA: Diagnosis present

## 2015-02-14 DIAGNOSIS — E876 Hypokalemia: Secondary | ICD-10-CM | POA: Insufficient documentation

## 2015-02-14 DIAGNOSIS — R079 Chest pain, unspecified: Secondary | ICD-10-CM | POA: Diagnosis not present

## 2015-02-14 DIAGNOSIS — Z888 Allergy status to other drugs, medicaments and biological substances status: Secondary | ICD-10-CM

## 2015-02-14 DIAGNOSIS — D649 Anemia, unspecified: Secondary | ICD-10-CM

## 2015-02-14 DIAGNOSIS — Z8249 Family history of ischemic heart disease and other diseases of the circulatory system: Secondary | ICD-10-CM

## 2015-02-14 DIAGNOSIS — M1990 Unspecified osteoarthritis, unspecified site: Secondary | ICD-10-CM | POA: Diagnosis present

## 2015-02-14 DIAGNOSIS — W19XXXD Unspecified fall, subsequent encounter: Secondary | ICD-10-CM | POA: Diagnosis present

## 2015-02-14 DIAGNOSIS — E875 Hyperkalemia: Secondary | ICD-10-CM | POA: Insufficient documentation

## 2015-02-14 DIAGNOSIS — N289 Disorder of kidney and ureter, unspecified: Secondary | ICD-10-CM

## 2015-02-14 LAB — COMPREHENSIVE METABOLIC PANEL
ALK PHOS: 95 U/L (ref 38–126)
ALT: 10 U/L — AB (ref 14–54)
AST: 18 U/L (ref 15–41)
Albumin: 2.9 g/dL — ABNORMAL LOW (ref 3.5–5.0)
Anion gap: 12 (ref 5–15)
BILIRUBIN TOTAL: 0.8 mg/dL (ref 0.3–1.2)
BUN: 46 mg/dL — ABNORMAL HIGH (ref 6–20)
CALCIUM: 10 mg/dL (ref 8.9–10.3)
CHLORIDE: 102 mmol/L (ref 101–111)
CO2: 19 mmol/L — AB (ref 22–32)
Creatinine, Ser: 3.39 mg/dL — ABNORMAL HIGH (ref 0.44–1.00)
GFR, EST AFRICAN AMERICAN: 15 mL/min — AB (ref >60)
GFR, EST NON AFRICAN AMERICAN: 13 mL/min — AB (ref >60)
Glucose, Bld: 105 mg/dL — ABNORMAL HIGH (ref 65–99)
Potassium: 5.3 mmol/L — ABNORMAL HIGH (ref 3.5–5.1)
Sodium: 133 mmol/L — ABNORMAL LOW (ref 135–145)
Total Protein: 6 g/dL — ABNORMAL LOW (ref 6.5–8.1)

## 2015-02-14 LAB — URINALYSIS, ROUTINE W REFLEX MICROSCOPIC
BILIRUBIN URINE: NEGATIVE
Glucose, UA: NEGATIVE mg/dL
Ketones, ur: NEGATIVE mg/dL
NITRITE: NEGATIVE
PH: 5.5 (ref 5.0–8.0)
Protein, ur: NEGATIVE mg/dL
SPECIFIC GRAVITY, URINE: 1.014 (ref 1.005–1.030)

## 2015-02-14 LAB — CBC WITH DIFFERENTIAL/PLATELET
BASOS PCT: 0 %
Basophils Absolute: 0 10*3/uL (ref 0.0–0.1)
Eosinophils Absolute: 0 10*3/uL (ref 0.0–0.7)
Eosinophils Relative: 1 %
HEMATOCRIT: 27.4 % — AB (ref 36.0–46.0)
HEMOGLOBIN: 8.6 g/dL — AB (ref 12.0–15.0)
LYMPHS ABS: 1.3 10*3/uL (ref 0.7–4.0)
LYMPHS PCT: 21 %
MCH: 26 pg (ref 26.0–34.0)
MCHC: 31.4 g/dL (ref 30.0–36.0)
MCV: 82.8 fL (ref 78.0–100.0)
MONOS PCT: 5 %
Monocytes Absolute: 0.3 10*3/uL (ref 0.1–1.0)
NEUTROS ABS: 4.7 10*3/uL (ref 1.7–7.7)
NEUTROS PCT: 73 %
Platelets: 199 10*3/uL (ref 150–400)
RBC: 3.31 MIL/uL — ABNORMAL LOW (ref 3.87–5.11)
RDW: 16.4 % — ABNORMAL HIGH (ref 11.5–15.5)
WBC: 6.3 10*3/uL (ref 4.0–10.5)

## 2015-02-14 LAB — URINE MICROSCOPIC-ADD ON

## 2015-02-14 MED ORDER — SODIUM CHLORIDE 0.9 % IV SOLN
INTRAVENOUS | Status: DC
  Administered 2015-02-14: 23:00:00 via INTRAVENOUS

## 2015-02-14 MED ORDER — ASPIRIN 325 MG PO TABS
325.0000 mg | ORAL_TABLET | Freq: Every day | ORAL | Status: DC
  Administered 2015-02-14: 325 mg via ORAL
  Filled 2015-02-14: qty 1

## 2015-02-14 MED ORDER — SODIUM CHLORIDE 0.9 % IV BOLUS (SEPSIS)
500.0000 mL | Freq: Once | INTRAVENOUS | Status: AC
  Administered 2015-02-14: 500 mL via INTRAVENOUS

## 2015-02-14 MED ORDER — MORPHINE SULFATE (PF) 2 MG/ML IV SOLN
2.0000 mg | INTRAVENOUS | Status: DC | PRN
  Administered 2015-02-15 – 2015-02-18 (×5): 2 mg via INTRAVENOUS
  Administered 2015-02-19: 1 mg via INTRAVENOUS
  Administered 2015-02-20: 2 mg via INTRAVENOUS
  Filled 2015-02-14 (×8): qty 1

## 2015-02-14 NOTE — ED Provider Notes (Signed)
CSN: [id number]     Arrival date & time 02/14/15  1711 History   First MD Initiated Contact with Patient 02/14/15 1718     Chief Complaint  Patient presents with  . Dizziness     (Consider location/radiation/quality/duration/timing/severity/associated sxs/prior Treatment) HPI   Debbie [name] is a 65 y.o. female who presents for evaluation of dizziness which is described as a heavy feeling, behind her left eye. She states that it is not a "spinning" sensation. She states that she has a left-sided headache. She denies fever, chills, nausea, vomiting, change in vision since onset of the dizziness, or chest pain. She has glaucoma and has felt like her vision has worsened over the last month. She has had similar dizziness in the past and when she does it makes it hard to walk. At this time. She is not walking because of a right foot fracture, which is being treated with nonweight bearing in a cast. She is due to have it evaluated, in 2 days time to see if she can get it taken off. There are no other known modifying factors.   Past Medical History  Diagnosis Date  . Kidney transplant as cause of abnormal reaction or later complication   . Hypertension   . Colon polyps   . Gall stones 1980  . Anti-phospholipid antibody syndrome [facility])     (Based on hospital discharge summary march 2013)  . Paroxysmal atrial fibrillation (HCC)   . Carpal tunnel syndrome 2003    lt  . Esophageal stricture   . Hemorrhoids   . Renal disorder   . ESRD (end stage renal disease) on dialysis [facility])     "til I got my transplant in 2010"  . DVT (deep venous thrombosis) (HCC) 1990    LLE  . History of blood transfusion     "related to the lupus"  . Dyslipidemia 07/22/2013  . Coronary artery disease   . Heart murmur     mild MR on echo  . Pneumonia     "3 times now" (08/25/2013)  . GERD (gastroesophageal reflux disease)   . Arthritis     "fingers" (08/25/2013)  . Glaucoma, left eye   . Bilateral carotid artery  stenosis     1-39%  . Pulmonary HTN (HCC)     PASP 24mmHg   Past Surgical History  Procedure Laterality Date  . Nephrectomy transplanted organ  05/2008  . Cholecystectomy  1980  . Hematoma evacuation  1998    "after they took peritoneal catheter out"  . Peritoneal catheter insertion    . Peritoneal catheter removal    . Carpal tunnel release Right 07/2001  . Arteriovenous graft placement    . Thrombectomy and revision of arterioventous (av) goretex  graft Right 07/1999; 04/2002; 09/2002; 12/2005; 09/2007;     upper arm/notes 07/31/1999; 05/18/2002; 10/07/2002; 01/14/2006; 09/14/2007;   . Right heart catheterization N/A 08/28/2013    Procedure: RIGHT HEART CATH;  Surgeon: Sinclair Grooms, MD;  Location: [facility] CATH LAB;  Service: Cardiovascular;  Laterality: N/A;   Family History  Problem Relation Age of Onset  . Hypertension Mother   . Heart disease Mother   . Colon cancer Neg Hx   . Rectal cancer Neg Hx   . Stomach cancer Neg Hx    Social History  Substance Use Topics  . Smoking status: Former Smoker -- 0.00 packs/day for 30 years    Types: Cigarettes    Quit date: 04/16/1977  . Smokeless tobacco:  Never Used  . Alcohol Use: Yes   OB History    Gravida Para Term Preterm AB TAB SAB Ectopic Multiple Living   1              Review of Systems  All other systems reviewed and are negative.     Allergies  Feraheme; Vancomycin; Dorzolamide hcl-timolol mal; Gentamycin; Latanoprost; Cefazolin; Codeine; Hydrocodone-acetaminophen; and Penicillins  Home Medications   Prior to Admission medications   Medication Sig Start Date End Date Taking? Authorizing Provider  acetaminophen (TYLENOL) 500 MG tablet Take 1,000 mg by mouth every 6 (six) hours as needed for moderate pain (pain).   Yes Historical Provider, MD  allopurinol (ZYLOPRIM) 100 MG tablet Take 200 mg by mouth 2 (two) times daily. 01/10/15  Yes Historical Provider, MD  aspirin EC 81 MG tablet Take 81 mg by mouth every evening.    Yes  Historical Provider, MD  bimatoprost (LUMIGAN) 0.01 % SOLN Place 1 drop into both eyes at bedtime.  05/21/13  Yes Historical Provider, MD  Brinzolamide-Brimonidine (SIMBRINZA) 1-0.2 % SUSP Place 1 drop into the left eye 3 (three) times daily. 09/14/14  Yes Historical Provider, MD  calcitRIOL (ROCALTROL) 0.25 MCG capsule Take 0.25-0.5 mcg by mouth daily. Take .25 mcg - .78mcg by mouth daily, alternating with .5 mcg.c   Yes Historical Provider, MD  furosemide (LASIX) 20 MG tablet Take 20 mg by mouth daily. Excess edema   Yes Historical Provider, MD  isosorbide dinitrate (ISORDIL) 10 MG tablet TAKE 1 TABLET BY MOUTH THREE TIMES DAILY 03/04/14  Yes Doreene Burke Kilroy, PA-C  labetalol (NORMODYNE) 200 MG tablet Take 100 mg by mouth 2 (two) times daily.    Yes Historical Provider, MD  MAGNESIUM-OXIDE 400 (241.3 MG) MG tablet TAKE 1 TABLET BY MOUTH TWICE DAILY 10/26/14  Yes Sueanne Margarita, MD  methocarbamol (ROBAXIN) 500 MG tablet Take 1 tablet (500 mg total) by mouth 2 (two) times daily. 01/23/15  Yes Marella Chimes, PA-C  mycophenolate (MYFORTIC) 180 MG EC tablet Take 180 mg by mouth 2 (two) times daily.    Yes Historical Provider, MD  nitroGLYCERIN (NITROSTAT) 0.4 MG SL tablet Place 0.4 mg under the tongue every 5 (five) minutes as needed for chest pain.  08/26/13  Yes Historical Provider, MD  oxyCODONE-acetaminophen (PERCOCET/ROXICET) 5-325 MG tablet Take 1 tablet by mouth every 4 (four) hours as needed for severe pain. 01/23/15  Yes Marella Chimes, PA-C  predniSONE (DELTASONE) 5 MG tablet Take 5 mg by mouth daily with breakfast.  04/24/12  Yes Historical Provider, MD  RANEXA 500 MG 12 hr tablet TAKE 1 TABLET BY MOUTH TWICE DAILY 01/11/15  Yes Sueanne Margarita, MD  ranitidine (ZANTAC) 150 MG tablet Take 150 mg by mouth daily.    Yes Historical Provider, MD  sodium bicarbonate 650 MG tablet Take 1,300 mg by mouth 2 (two) times daily.  07/13/11  Yes Historical Provider, MD  tacrolimus (PROGRAF) 1 MG capsule Take 2  mg by mouth 2 (two) times daily.    Yes Historical Provider, MD  timolol (TIMOPTIC) 0.5 % ophthalmic solution Place 1 drop into both eyes 2 (two) times daily.  05/21/13  Yes Historical Provider, MD  traMADol (ULTRAM) 50 MG tablet Take 1 tablet (50 mg total) by mouth every 6 (six) hours as needed. Patient taking differently: Take 50 mg by mouth every 6 (six) hours as needed for moderate pain.  01/23/15  Yes Marella Chimes, PA-C  warfarin (COUMADIN) 2.5  MG tablet Take 2.5 mg by mouth daily at 6 PM. Monday and Friday 2.5 mg and 1.25 mg on all other days 07/10/11  Yes Historical Provider, MD   BP 140/60 mmHg  Pulse 76  Temp(Src) 98.4 F (36.9 C) (Oral)  Resp 18  SpO2 99% Physical Exam  Constitutional: She is oriented to person, place, and time. She appears well-developed. No distress.  Elderly, frail  HENT:  Head: Normocephalic and atraumatic.  Right Ear: External ear normal.  Left Ear: External ear normal.  Eyes: Conjunctivae and EOM are normal. Pupils are equal, round, and reactive to light.  Neck: Normal range of motion and phonation normal. Neck supple.  Cardiovascular: Normal rate, regular rhythm and normal heart sounds.   Right forearm vascular graft, with normal pulsation.  Pulmonary/Chest: Effort normal and breath sounds normal. She exhibits no bony tenderness.  Chest wall nontender to palpation  Abdominal: Soft. There is no tenderness.  Musculoskeletal:  Short leg cast, right lower leg.  Neurological: She is alert and oriented to person, place, and time. No cranial nerve deficit or sensory deficit. She exhibits normal muscle tone. Coordination normal.  Skin: Skin is warm, dry and intact.  Psychiatric: She has a normal mood and affect. Her behavior is normal. Judgment and thought content normal.  Nursing note and vitals reviewed.   ED Course  Procedures (including critical care time)  Medications  sodium chloride 0.9 % bolus 500 mL (not administered)  0.9 %  sodium  chloride infusion (not administered)    Patient Vitals for the past 24 hrs:  BP Temp Temp src Pulse Resp SpO2  02/14/15 2115 140/60 mmHg - - 76 18 99 %  02/14/15 1930 148/61 mmHg - - 73 20 99 %  02/14/15 1915 150/61 mmHg - - 72 20 98 %  02/14/15 1845 - 98.4 F (36.9 C) - - - -  02/14/15 1745 162/65 mmHg - - 75 17 99 %  02/14/15 1727 154/70 mmHg 98.4 F (36.9 C) Oral 74 18 100 %    10:13 PM Reevaluation with update and discussion. After initial assessment and treatment, an updated evaluation reveals . Pansy Ostrovsky L    10:18 PM-Consult complete with Hospitalist. Patient case explained and discussed. She agrees to admit patient for further evaluation and treatment. Call ended at [place] - Abnormal; Notable for the following:    Sodium 133 (*)    Potassium 5.3 (*)    CO2 19 (*)    Glucose, Bld 105 (*)    BUN 46 (*)    Creatinine, Ser 3.39 (*)    Total Protein 6.0 (*)    Albumin 2.9 (*)    ALT 10 (*)    GFR calc non Af Amer 13 (*)    GFR calc Af Amer 15 (*)    All other components within normal limits  CBC WITH DIFFERENTIAL/PLATELET - Abnormal; Notable for the following:    RBC 3.31 (*)    Hemoglobin 8.6 (*)    HCT 27.4 (*)    RDW 16.4 (*)    All other components within normal limits  URINALYSIS, ROUTINE W REFLEX MICROSCOPIC (NOT AT [facility]) - Abnormal; Notable for the following:    APPearance CLOUDY (*)    Hgb urine dipstick SMALL (*)    Leukocytes, UA MODERATE (*)    All other components within normal limits  URINE MICROSCOPIC-ADD ON - Abnormal; Notable for the following:    Squamous Epithelial /  LPF 6-30 (*)    Bacteria, UA RARE (*)    All other components within normal limits   . BUN  Date Value Ref Range Status  02/14/2015 46* 6 - 20 mg/dL Final  12/01/2014 36* 6 - 20 mg/dL Final  09/02/2014 32* 6 - 23 mg/dL Final  06/02/2014 39* 6 - 20 mg/dL Final   CREATININE, SER  Date Value Ref Range Status   02/14/2015 3.39* 0.44 - 1.00 mg/dL Final  12/01/2014 2.57* 0.44 - 1.00 mg/dL Final  09/02/2014 1.92* 0.40 - 1.20 mg/dL Final  06/02/2014 2.70* 0.44 - 1.00 mg/dL Final   HEMOGLOBIN  Date Value Ref Range Status  02/14/2015 8.6* 12.0 - 15.0 g/dL Final  01/04/2015 10.3* 12.0 - 15.0 g/dL Final  12/21/2014 9.2* 12.0 - 15.0 g/dL Final  12/01/2014 10.6* 12.0 - 15.0 g/dL Final      Imaging Review Ct Head Wo Contrast  02/14/2015  CLINICAL DATA:  Dizziness and nausea this morning. Similar of stones previously. EXAM: CT HEAD WITHOUT CONTRAST TECHNIQUE: Contiguous axial images were obtained from the base of the skull through the vertex without intravenous contrast. COMPARISON:  There is no evidence of mass effect, midline shift, or extra-axial fluid collections. There is no evidence of a space-occupying lesion or intracranial hemorrhage. There is no evidence of a cortical-based area of acute infarction. There is generalized cerebral atrophy. There is periventricular white matter low attenuation likely secondary to microangiopathy. The ventricles and sulci are appropriate for the patient's age. The basal cisterns are patent. Visualized portions of the orbits are unremarkable. The visualized portions of the paranasal sinuses and mastoid air cells are unremarkable. Cerebrovascular atherosclerotic calcifications are noted. The osseous structures are unremarkable. FINDINGS: 1. No acute intracranial pathology. 2. Chronic microvascular disease and cerebral atrophy. Electronically Signed   By: Kathreen Devoid   On: 02/14/2015 18:19   I have personally reviewed and evaluated these images and lab results as part of my medical decision-making.   EKG Interpretation   Date/Time:  Monday February 14 2015 17:26:57 EST Ventricular Rate:  72 PR Interval:  209 QRS Duration: 115 QT Interval:  437 QTC Calculation: 478 R Axis:   36 Text Interpretation:  Sinus rhythm Nonspecific intraventricular conduction  delay since  last tracing no significant change Confirmed by Eulis Foster  MD,  Vira Agar [id number]) on 02/14/2015 5:43:51 PM      MDM   Final diagnoses:  Acute on chronic renal insufficiency (HCC)  Anemia, unspecified anemia type    Worsening renal function as likely source of dizziness, with mild hyperkalemia, and decreased hemoglobin from baseline. Patient will need admission for gentle hydration, renal consultation, and consideration for further intervention. She has a kidney transplant and is immunocompromised, on antirejection medication.  Nursing Notes Reviewed/ Care Coordinated Applicable Imaging Reviewed Interpretation of Laboratory Data incorporated into ED treatment    Daleen Bo, MD 02/15/15 [phone]

## 2015-02-14 NOTE — ED Notes (Signed)
IV team at bedside 

## 2015-02-14 NOTE — ED Notes (Signed)
Pt provided with coffee, per request

## 2015-02-14 NOTE — ED Notes (Addendum)
MD at bedside updating pt family

## 2015-02-14 NOTE — H&P (Signed)
PCP: No PCP Per Patient  Cardiology Fransico Him  Referring provider Marlene Lard   Chief Complaint:  Dizziness and nausea  HPI: Debbie [name] is a 65 y.o. female   has a past medical history of Kidney transplant as cause of abnormal reaction or later complication; Hypertension; Colon polyps; Gall stones (1980); Anti-phospholipid antibody syndrome ([place]); Paroxysmal atrial fibrillation ([place]); Carpal tunnel syndrome (2003); Esophageal stricture; Hemorrhoids; Renal disorder; ESRD (end stage renal disease) on dialysis [facility]); DVT (deep venous thrombosis) ([place]) (1990); History of blood transfusion; Dyslipidemia (07/22/2013); Coronary artery disease; Heart murmur; Pneumonia; GERD (gastroesophageal reflux disease); Arthritis; Glaucoma, left eye; Bilateral carotid artery stenosis; and Pulmonary HTN ([place]).   Presented with waking up this morning with episodes of "dizziness" and nausea but reports that she have had similar presentations in the past and her potassium goes up. Denies spinning sensation describes that this heavy sensation in her head Patient is status post renal transplant since 2010. She has antiphospholipid antibody syndrome, and history of DVTs as well as  known history of paroxysmal atrial fibrillation on Coumadin. She reports decreased PO intake.   In ER patient developed substernal Chest pain vs tightening sensation. Patient refuses nitroglycerin but was given aspirin. Chest pain resolved within 10 minutes patient states she has had previous episodes of this usually it's worse with laying down on her back. DG exam of chest pain showed some nonspecific EKG changes. Troponin unremarkable  Of note on December 31 patient had a fall resulting in right ankle fracture since then her mobility has been somewhat affected Hospitalist was called for admission for acute on chronic renal failure with hyperkalemia  Review of Systems:    Pertinent positives include: Lightheaded, chest pain,    Constitutional:  No weight loss, night sweats, Fevers, chills, fatigue, weight loss  HEENT:  No headaches, Difficulty swallowing,Tooth/dental problems,Sore throat,  No sneezing, itching, ear ache, nasal congestion, post nasal drip,  Cardio-vascular:  No Orthopnea, PND, anasarca, dizziness, palpitations.no Bilateral lower extremity swelling  GI:  No heartburn, indigestion, abdominal pain, nausea, vomiting, diarrhea, change in bowel habits, loss of appetite, melena, blood in stool, hematemesis Resp:  no shortness of breath at rest. No dyspnea on exertion, No excess mucus, no productive cough, No non-productive cough, No coughing up of blood.No change in color of mucus.No wheezing. Skin:  no rash or lesions. No jaundice GU:  no dysuria, change in color of urine, no urgency or frequency. No straining to urinate.  No flank pain.  Musculoskeletal:  No joint pain or no joint swelling. No decreased range of motion. No back pain.  Psych:  No change in mood or affect. No depression or anxiety. No memory loss.  Neuro: no localizing neurological complaints, no tingling, no weakness, no double vision, no gait abnormality, no slurred speech, no confusion  Otherwise ROS are negative except for above, 10 systems were reviewed  Past Medical History: Past Medical History  Diagnosis Date  . Kidney transplant as cause of abnormal reaction or later complication   . Hypertension   . Colon polyps   . Gall stones 1980  . Anti-phospholipid antibody syndrome [facility])     (Based on hospital discharge summary march 2013)  . Paroxysmal atrial fibrillation (HCC)   . Carpal tunnel syndrome 2003    lt  . Esophageal stricture   . Hemorrhoids   . Renal disorder   . ESRD (end stage renal disease) on dialysis [facility])     "til I got my transplant in 2010"  .  DVT (deep venous thrombosis) (HCC) 1990    LLE  . History of blood transfusion     "related to the lupus"  . Dyslipidemia 07/22/2013  . Coronary artery  disease   . Heart murmur     mild MR on echo  . Pneumonia     "3 times now" (08/25/2013)  . GERD (gastroesophageal reflux disease)   . Arthritis     "fingers" (08/25/2013)  . Glaucoma, left eye   . Bilateral carotid artery stenosis     1-39%  . Pulmonary HTN (HCC)     PASP 31mmHg   Past Surgical History  Procedure Laterality Date  . Nephrectomy transplanted organ  05/2008  . Cholecystectomy  1980  . Hematoma evacuation  1998    "after they took peritoneal catheter out"  . Peritoneal catheter insertion    . Peritoneal catheter removal    . Carpal tunnel release Right 07/2001  . Arteriovenous graft placement    . Thrombectomy and revision of arterioventous (av) goretex  graft Right 07/1999; 04/2002; 09/2002; 12/2005; 09/2007;     upper arm/notes 07/31/1999; 05/18/2002; 10/07/2002; 01/14/2006; 09/14/2007;   . Right heart catheterization N/A 08/28/2013    Procedure: RIGHT HEART CATH;  Surgeon: Sinclair Grooms, MD;  Location: [facility] CATH LAB;  Service: Cardiovascular;  Laterality: N/A;     Medications: Prior to Admission medications   Medication Sig Start Date End Date Taking? Authorizing Provider  acetaminophen (TYLENOL) 500 MG tablet Take 1,000 mg by mouth every 6 (six) hours as needed for moderate pain (pain).   Yes Historical Provider, MD  allopurinol (ZYLOPRIM) 100 MG tablet Take 200 mg by mouth 2 (two) times daily. 01/10/15  Yes Historical Provider, MD  aspirin EC 81 MG tablet Take 81 mg by mouth every evening.    Yes Historical Provider, MD  bimatoprost (LUMIGAN) 0.01 % SOLN Place 1 drop into both eyes at bedtime.  05/21/13  Yes Historical Provider, MD  Brinzolamide-Brimonidine (SIMBRINZA) 1-0.2 % SUSP Place 1 drop into the left eye 3 (three) times daily. 09/14/14  Yes Historical Provider, MD  calcitRIOL (ROCALTROL) 0.25 MCG capsule Take 0.25-0.5 mcg by mouth daily. Take .25 mcg - .24mcg by mouth daily, alternating with .5 mcg.c   Yes Historical Provider, MD  furosemide (LASIX) 20 MG tablet Take  20 mg by mouth daily. Excess edema   Yes Historical Provider, MD  isosorbide dinitrate (ISORDIL) 10 MG tablet TAKE 1 TABLET BY MOUTH THREE TIMES DAILY 03/04/14  Yes Doreene Burke Kilroy, PA-C  labetalol (NORMODYNE) 200 MG tablet Take 100 mg by mouth 2 (two) times daily.    Yes Historical Provider, MD  MAGNESIUM-OXIDE 400 (241.3 MG) MG tablet TAKE 1 TABLET BY MOUTH TWICE DAILY 10/26/14  Yes Sueanne Margarita, MD  methocarbamol (ROBAXIN) 500 MG tablet Take 1 tablet (500 mg total) by mouth 2 (two) times daily. 01/23/15  Yes Marella Chimes, PA-C  mycophenolate (MYFORTIC) 180 MG EC tablet Take 180 mg by mouth 2 (two) times daily.    Yes Historical Provider, MD  nitroGLYCERIN (NITROSTAT) 0.4 MG SL tablet Place 0.4 mg under the tongue every 5 (five) minutes as needed for chest pain.  08/26/13  Yes Historical Provider, MD  oxyCODONE-acetaminophen (PERCOCET/ROXICET) 5-325 MG tablet Take 1 tablet by mouth every 4 (four) hours as needed for severe pain. 01/23/15  Yes Marella Chimes, PA-C  predniSONE (DELTASONE) 5 MG tablet Take 5 mg by mouth daily with breakfast.  04/24/12  Yes Historical Provider, MD  RANEXA 500 MG 12 hr tablet TAKE 1 TABLET BY MOUTH TWICE DAILY 01/11/15  Yes Sueanne Margarita, MD  ranitidine (ZANTAC) 150 MG tablet Take 150 mg by mouth daily.    Yes Historical Provider, MD  sodium bicarbonate 650 MG tablet Take 1,300 mg by mouth 2 (two) times daily.  07/13/11  Yes Historical Provider, MD  tacrolimus (PROGRAF) 1 MG capsule Take 2 mg by mouth 2 (two) times daily.    Yes Historical Provider, MD  timolol (TIMOPTIC) 0.5 % ophthalmic solution Place 1 drop into both eyes 2 (two) times daily.  05/21/13  Yes Historical Provider, MD  traMADol (ULTRAM) 50 MG tablet Take 1 tablet (50 mg total) by mouth every 6 (six) hours as needed. Patient taking differently: Take 50 mg by mouth every 6 (six) hours as needed for moderate pain.  01/23/15  Yes Marella Chimes, PA-C  warfarin (COUMADIN) 2.5 MG tablet Take 2.5 mg by  mouth daily at 6 PM. Monday and Friday 2.5 mg and 1.25 mg on all other days 07/10/11  Yes Historical Provider, MD    Allergies:   Allergies  Allergen Reactions  . Feraheme [Ferumoxytol] Other (See Comments)    Chest pain, pulsating  . Vancomycin Anaphylaxis, Itching, Swelling and Other (See Comments)    Tongue swell  . Dorzolamide Hcl-Timolol Mal Rash and Other (See Comments)    Eye pain  . Gentamycin [Gentamicin Sulfate] Itching and Swelling  . Latanoprost Rash and Other (See Comments)    Eye pain  . Cefazolin Itching  . Codeine Itching  . Hydrocodone-Acetaminophen Itching  . Penicillins Itching    Has patient had a PCN reaction causing immediate rash, facial/tongue/throat swelling, SOB or lightheadedness with hypotension:No Has patient had a PCN reaction causing severe rash involving mucus membranes or skin necrosis:No Has patient had a PCN reaction that required hospitalization:No Has patient had a PCN reaction occurring within the last 10 years:No If all of the above answers are "NO", then may proceed with Cephalosporin use.     Social History:  Ambulatory now needs assistance since she fell Lives at home  With family     reports that she quit smoking about 37 years ago. Her smoking use included Cigarettes. She smoked 0.00 packs per day for 30 years. She has never used smokeless tobacco. She reports that she drinks alcohol. She reports that she does not use illicit drugs.     Family History: family history includes Heart disease in her mother; Hypertension in her mother. There is no history of Colon cancer, Rectal cancer, or Stomach cancer.    Physical Exam: Patient Vitals for the past 24 hrs:  BP Temp Temp src Pulse Resp SpO2  02/14/15 2215 156/69 mmHg - - 74 18 100 %  02/14/15 2115 140/60 mmHg - - 76 18 99 %  02/14/15 1930 148/61 mmHg - - 73 20 99 %  02/14/15 1915 150/61 mmHg - - 72 20 98 %  02/14/15 1845 - 98.4 F (36.9 C) - - - -  02/14/15 1745 162/65 mmHg - -  75 17 99 %  02/14/15 1727 154/70 mmHg 98.4 F (36.9 C) Oral 74 18 100 %    1. General:  in No Acute distress 2. Psychological: Alert and  Oriented 3. Head/ENT:    Dry Mucous Membranes                          Head Non traumatic, neck supple  Normal   Dentition 4. SKIN:   decreased Skin turgor,  Skin clean Dry and intact no rash 5. Heart: Regular rate and rhythm no Murmur, Rub or gallop 6. Lungs: Clear to auscultation bilaterally, no wheezes or crackles   7. Abdomen: Soft, non-tender, Non distended 8. Lower extremities: no clubbing, cyanosis, or edema 9. Neurologically Grossly intact, moving all 4 extremities equally 10. MSK: Normal range of motion  body mass index is unknown because there is no weight on file.   Labs on Admission:   Results for orders placed or performed during the hospital encounter of 02/14/15 (from the past 24 hour(s))  Comprehensive metabolic panel     Status: Abnormal   Collection Time: 02/14/15  6:37 PM  Result Value Ref Range   Sodium 133 (L) 135 - 145 mmol/L   Potassium 5.3 (H) 3.5 - 5.1 mmol/L   Chloride 102 101 - 111 mmol/L   CO2 19 (L) 22 - 32 mmol/L   Glucose, Bld 105 (H) 65 - 99 mg/dL   BUN 46 (H) 6 - 20 mg/dL   Creatinine, Ser 3.39 (H) 0.44 - 1.00 mg/dL   Calcium 10.0 8.9 - 10.3 mg/dL   Total Protein 6.0 (L) 6.5 - 8.1 g/dL   Albumin 2.9 (L) 3.5 - 5.0 g/dL   AST 18 15 - 41 U/L   ALT 10 (L) 14 - 54 U/L   Alkaline Phosphatase 95 38 - 126 U/L   Total Bilirubin 0.8 0.3 - 1.2 mg/dL   GFR calc non Af Amer 13 (L) >60 mL/min   GFR calc Af Amer 15 (L) >60 mL/min   Anion gap 12 5 - 15  CBC with Differential     Status: Abnormal   Collection Time: 02/14/15  6:37 PM  Result Value Ref Range   WBC 6.3 4.0 - 10.5 K/uL   RBC 3.31 (L) 3.87 - 5.11 MIL/uL   Hemoglobin 8.6 (L) 12.0 - 15.0 g/dL   HCT 27.4 (L) 36.0 - 46.0 %   MCV 82.8 78.0 - 100.0 fL   MCH 26.0 26.0 - 34.0 pg   MCHC 31.4 30.0 - 36.0 g/dL   RDW 16.4 (H) 11.5 - 15.5  %   Platelets 199 150 - 400 K/uL   Neutrophils Relative % 73 %   Neutro Abs 4.7 1.7 - 7.7 K/uL   Lymphocytes Relative 21 %   Lymphs Abs 1.3 0.7 - 4.0 K/uL   Monocytes Relative 5 %   Monocytes Absolute 0.3 0.1 - 1.0 K/uL   Eosinophils Relative 1 %   Eosinophils Absolute 0.0 0.0 - 0.7 K/uL   Basophils Relative 0 %   Basophils Absolute 0.0 0.0 - 0.1 K/uL  Urinalysis, Routine w reflex microscopic     Status: Abnormal   Collection Time: 02/14/15  7:55 PM  Result Value Ref Range   Color, Urine YELLOW YELLOW   APPearance CLOUDY (A) CLEAR   Specific Gravity, Urine 1.014 1.005 - 1.030   pH 5.5 5.0 - 8.0   Glucose, UA NEGATIVE NEGATIVE mg/dL   Hgb urine dipstick SMALL (A) NEGATIVE   Bilirubin Urine NEGATIVE NEGATIVE   Ketones, ur NEGATIVE NEGATIVE mg/dL   Protein, ur NEGATIVE NEGATIVE mg/dL   Nitrite NEGATIVE NEGATIVE   Leukocytes, UA MODERATE (A) NEGATIVE  Urine microscopic-add on     Status: Abnormal   Collection Time: 02/14/15  7:55 PM  Result Value Ref Range   Squamous Epithelial / LPF 6-30 (A) NONE SEEN   WBC, UA 6-30  0 - 5 WBC/hpf   RBC / HPF 0-5 0 - 5 RBC/hpf   Bacteria, UA RARE (A) NONE SEEN    UA WBC 6-30 bacteria rare  Lab Results  Component Value Date   HGBA1C 5.4 08/24/2013    CrCl cannot be calculated (Unknown ideal weight.).  BNP (last 3 results) No results for input(s): PROBNP in the last 8760 hours.  Other results:  I have pearsonaly reviewed this: ECG REPORT  Rate: 73  Rhythm: NSR ST&T Change: ST depressions mild in inferior and lateral leads QTC 478  There were no vitals filed for this visit.   Cultures:    Component Value Date/Time   SDES URINE, RANDOM 04/18/2011 2203   SPECREQUEST NONE 04/18/2011 2203   CULT NO GROWTH 04/18/2011 2203   REPTSTATUS 04/20/2011 FINAL 04/18/2011 2203     Radiological Exams on Admission: Ct Head Wo Contrast  02/14/2015  CLINICAL DATA:  Dizziness and nausea this morning. Similar of stones previously. EXAM: CT  HEAD WITHOUT CONTRAST TECHNIQUE: Contiguous axial images were obtained from the base of the skull through the vertex without intravenous contrast. COMPARISON:  There is no evidence of mass effect, midline shift, or extra-axial fluid collections. There is no evidence of a space-occupying lesion or intracranial hemorrhage. There is no evidence of a cortical-based area of acute infarction. There is generalized cerebral atrophy. There is periventricular white matter low attenuation likely secondary to microangiopathy. The ventricles and sulci are appropriate for the patient's age. The basal cisterns are patent. Visualized portions of the orbits are unremarkable. The visualized portions of the paranasal sinuses and mastoid air cells are unremarkable. Cerebrovascular atherosclerotic calcifications are noted. The osseous structures are unremarkable. FINDINGS: 1. No acute intracranial pathology. 2. Chronic microvascular disease and cerebral atrophy. Electronically Signed   By: Kathreen Devoid   On: 02/14/2015 18:19    Chart has been reviewed  Family not  at  Bedside    Assessment/Plan  65 yo F with hx of renal transplant here with "dizziness" acute on chronic renal failure, chest pain case has been discussed with nephrology who recommends rehydration and following up creatinine  Present on Admission:  . Acute renal failure superimposed on stage 3 chronic kidney disease ([place]) - rehydrate for renal function and obtain urine electrolytes. If kidney function does not improve will reconsult nephrology  . Dehydration - administer IV fluids check orthostatics prior to discharge  . HTN (hypertension) chronic continue home medications  . Kidney transplant as cause of abnormal reaction or later complication - continue antirejection medications  . PAF (paroxysmal atrial fibrillation) ([place]) - currently rate controlled,  continue Coumadin for pharmacy will be holding it for tonight given elevated INR  . Chest pain - -  given risk factors will admit, monitor on telemetry, cycle cardiac enzymes, obtain serial ECG. Further risk stratify with lipid panel, hgA1C, obtain TSH. Make sure patient is on Aspirin. Further treatment based on the currently pending results.     Prophylaxis: coumadin  CODE STATUS:  FULL CODE   as per patient    Disposition:   To home once workup is complete and patient is stable  Other plan as per orders.  I have spent a total of 65 min on this admission extra time was spent to discuss case with nephrology Dr. Mosetta Anis 02/15/2015, 4:38 AM   [facility]  Pager [phone]   after 2 AM please page floor coverage PA If 7AM-7PM, please contact the day team taking care of  the patient  [email or web address]  Password [email or web address]

## 2015-02-14 NOTE — ED Notes (Signed)
Called phlebotomy.  Pt sts only phlebotomy and IV team may poke her.

## 2015-02-14 NOTE — ED Notes (Signed)
Daughter - Debbie [name] - please call (any time) with room assignments or changes.  [phone]

## 2015-02-14 NOTE — ED Notes (Signed)
Phlebotomy at bedside.

## 2015-02-14 NOTE — ED Notes (Signed)
Per eMS:  Pt presents to ED from home after she woke up this am with dizziness and nausea.  Pt has hx of similar episodes, most recently due to hyperkalemia.  Pt had a R ankle break 01/22/15, and is the only pain she c/o.  Only recent med changes, per EMS, is pt was receiving two weekly infusions, and since her ankle break she has not been receiving.  Hx of kidney failure with kidney transplant and no current dialysis.  Vitals stable.  No EKG done en route.

## 2015-02-14 NOTE — ED Notes (Addendum)
Admitting in at bedside.  Patient began to state she was having Chest Pain.  Patient admitted she did not tell anyone about it while her family was present because "I don't tell my family everything".   Pt states she believes pain began approx 2130.  EKG provided to EDP and admitting, and follow up orders acknowledged.

## 2015-02-15 ENCOUNTER — Ambulatory Visit (HOSPITAL_BASED_OUTPATIENT_CLINIC_OR_DEPARTMENT_OTHER): Payer: Medicare Other

## 2015-02-15 ENCOUNTER — Encounter (HOSPITAL_COMMUNITY): Payer: Self-pay | Admitting: *Deleted

## 2015-02-15 ENCOUNTER — Observation Stay (HOSPITAL_COMMUNITY): Payer: Medicare Other

## 2015-02-15 DIAGNOSIS — Z7901 Long term (current) use of anticoagulants: Secondary | ICD-10-CM | POA: Diagnosis not present

## 2015-02-15 DIAGNOSIS — R079 Chest pain, unspecified: Secondary | ICD-10-CM

## 2015-02-15 DIAGNOSIS — I5021 Acute systolic (congestive) heart failure: Secondary | ICD-10-CM | POA: Diagnosis not present

## 2015-02-15 DIAGNOSIS — D649 Anemia, unspecified: Secondary | ICD-10-CM | POA: Insufficient documentation

## 2015-02-15 DIAGNOSIS — I200 Unstable angina: Secondary | ICD-10-CM | POA: Diagnosis not present

## 2015-02-15 DIAGNOSIS — I480 Paroxysmal atrial fibrillation: Secondary | ICD-10-CM

## 2015-02-15 DIAGNOSIS — N179 Acute kidney failure, unspecified: Secondary | ICD-10-CM | POA: Diagnosis not present

## 2015-02-15 DIAGNOSIS — N189 Chronic kidney disease, unspecified: Secondary | ICD-10-CM

## 2015-02-15 LAB — URINALYSIS, ROUTINE W REFLEX MICROSCOPIC
BILIRUBIN URINE: NEGATIVE
Glucose, UA: NEGATIVE mg/dL
Hgb urine dipstick: NEGATIVE
Ketones, ur: NEGATIVE mg/dL
NITRITE: NEGATIVE
PH: 7 (ref 5.0–8.0)
Protein, ur: NEGATIVE mg/dL
SPECIFIC GRAVITY, URINE: 1.014 (ref 1.005–1.030)

## 2015-02-15 LAB — COMPREHENSIVE METABOLIC PANEL
ALBUMIN: 2.8 g/dL — AB (ref 3.5–5.0)
ALK PHOS: 86 U/L (ref 38–126)
ALT: 9 U/L — ABNORMAL LOW (ref 14–54)
ANION GAP: 10 (ref 5–15)
AST: 18 U/L (ref 15–41)
BUN: 45 mg/dL — ABNORMAL HIGH (ref 6–20)
CALCIUM: 9.6 mg/dL (ref 8.9–10.3)
CO2: 19 mmol/L — AB (ref 22–32)
Chloride: 105 mmol/L (ref 101–111)
Creatinine, Ser: 3.41 mg/dL — ABNORMAL HIGH (ref 0.44–1.00)
GFR calc non Af Amer: 13 mL/min — ABNORMAL LOW (ref >60)
GFR, EST AFRICAN AMERICAN: 15 mL/min — AB (ref >60)
GLUCOSE: 97 mg/dL (ref 65–99)
POTASSIUM: 5.5 mmol/L — AB (ref 3.5–5.1)
SODIUM: 134 mmol/L — AB (ref 135–145)
TOTAL PROTEIN: 5.8 g/dL — AB (ref 6.5–8.1)
Total Bilirubin: 0.9 mg/dL (ref 0.3–1.2)

## 2015-02-15 LAB — URINE MICROSCOPIC-ADD ON

## 2015-02-15 LAB — CBC
HEMATOCRIT: 26.3 % — AB (ref 36.0–46.0)
HEMOGLOBIN: 8.5 g/dL — AB (ref 12.0–15.0)
MCH: 26.8 pg (ref 26.0–34.0)
MCHC: 32.3 g/dL (ref 30.0–36.0)
MCV: 83 fL (ref 78.0–100.0)
Platelets: 198 10*3/uL (ref 150–400)
RBC: 3.17 MIL/uL — AB (ref 3.87–5.11)
RDW: 16.4 % — ABNORMAL HIGH (ref 11.5–15.5)
WBC: 6.3 10*3/uL (ref 4.0–10.5)

## 2015-02-15 LAB — PHOSPHORUS: PHOSPHORUS: 3.1 mg/dL (ref 2.5–4.6)

## 2015-02-15 LAB — SODIUM, URINE, RANDOM: Sodium, Ur: 98 mmol/L

## 2015-02-15 LAB — PROTIME-INR
INR: 4.16 — AB (ref 0.00–1.49)
INR: 3.96 — AB (ref 0.00–1.49)
PROTHROMBIN TIME: 37.7 s — AB (ref 11.6–15.2)
Prothrombin Time: 39.1 seconds — ABNORMAL HIGH (ref 11.6–15.2)

## 2015-02-15 LAB — CREATININE, URINE, RANDOM: Creatinine, Urine: 59.68 mg/dL

## 2015-02-15 LAB — BASIC METABOLIC PANEL
ANION GAP: 12 (ref 5–15)
BUN: 46 mg/dL — AB (ref 6–20)
CALCIUM: 9.6 mg/dL (ref 8.9–10.3)
CO2: 18 mmol/L — ABNORMAL LOW (ref 22–32)
Chloride: 102 mmol/L (ref 101–111)
Creatinine, Ser: 3.31 mg/dL — ABNORMAL HIGH (ref 0.44–1.00)
GFR calc Af Amer: 16 mL/min — ABNORMAL LOW (ref >60)
GFR, EST NON AFRICAN AMERICAN: 14 mL/min — AB (ref >60)
Glucose, Bld: 108 mg/dL — ABNORMAL HIGH (ref 65–99)
POTASSIUM: 5.3 mmol/L — AB (ref 3.5–5.1)
SODIUM: 132 mmol/L — AB (ref 135–145)

## 2015-02-15 LAB — TSH: TSH: 1.966 u[IU]/mL (ref 0.350–4.500)

## 2015-02-15 LAB — GLUCOSE, CAPILLARY: Glucose-Capillary: 97 mg/dL (ref 65–99)

## 2015-02-15 LAB — IRON AND TIBC
Iron: 12 ug/dL — ABNORMAL LOW (ref 28–170)
SATURATION RATIOS: 7 % — AB (ref 10.4–31.8)
TIBC: 169 ug/dL — AB (ref 250–450)
UIBC: 157 ug/dL

## 2015-02-15 LAB — RETICULOCYTES
RBC.: 3.17 MIL/uL — ABNORMAL LOW (ref 3.87–5.11)
Retic Count, Absolute: 69.7 10*3/uL (ref 19.0–186.0)
Retic Ct Pct: 2.2 % (ref 0.4–3.1)

## 2015-02-15 LAB — MRSA PCR SCREENING: MRSA by PCR: NEGATIVE

## 2015-02-15 LAB — TROPONIN I
Troponin I: <0.03 ng/mL (ref <0.031)
Troponin I: 13.13 ng/mL (ref <0.031)

## 2015-02-15 LAB — MAGNESIUM: MAGNESIUM: 1.8 mg/dL (ref 1.7–2.4)

## 2015-02-15 LAB — VITAMIN B12: VITAMIN B 12: 264 pg/mL (ref 180–914)

## 2015-02-15 LAB — FERRITIN: Ferritin: 1128 ng/mL — ABNORMAL HIGH (ref 11–307)

## 2015-02-15 LAB — FOLATE: FOLATE: 13.6 ng/mL (ref >5.9)

## 2015-02-15 MED ORDER — MYCOPHENOLATE SODIUM 180 MG PO TBEC
180.0000 mg | DELAYED_RELEASE_TABLET | Freq: Two times a day (BID) | ORAL | Status: DC
  Administered 2015-02-15 – 2015-02-21 (×12): 180 mg via ORAL
  Filled 2015-02-15 (×16): qty 1

## 2015-02-15 MED ORDER — FUROSEMIDE 10 MG/ML IJ SOLN
20.0000 mg | Freq: Once | INTRAMUSCULAR | Status: AC
  Administered 2015-02-15: 20 mg via INTRAVENOUS

## 2015-02-15 MED ORDER — TACROLIMUS 1 MG PO CAPS
2.0000 mg | ORAL_CAPSULE | Freq: Two times a day (BID) | ORAL | Status: DC
  Administered 2015-02-15 – 2015-02-21 (×12): 2 mg via ORAL
  Filled 2015-02-15 (×16): qty 2

## 2015-02-15 MED ORDER — SODIUM CHLORIDE 0.9 % IJ SOLN
3.0000 mL | Freq: Two times a day (BID) | INTRAMUSCULAR | Status: DC
  Administered 2015-02-15 – 2015-02-18 (×8): 3 mL via INTRAVENOUS

## 2015-02-15 MED ORDER — ONDANSETRON HCL 4 MG PO TABS
4.0000 mg | ORAL_TABLET | Freq: Four times a day (QID) | ORAL | Status: DC | PRN
Start: 2015-02-15 — End: 2015-02-21

## 2015-02-15 MED ORDER — FUROSEMIDE 10 MG/ML IJ SOLN
40.0000 mg | Freq: Every day | INTRAMUSCULAR | Status: DC
  Administered 2015-02-16 – 2015-02-18 (×3): 40 mg via INTRAVENOUS
  Filled 2015-02-15 (×3): qty 4

## 2015-02-15 MED ORDER — SODIUM CHLORIDE 0.9 % IV SOLN
INTRAVENOUS | Status: AC
  Administered 2015-02-15: 03:00:00 via INTRAVENOUS

## 2015-02-15 MED ORDER — PREDNISONE 5 MG PO TABS
5.0000 mg | ORAL_TABLET | Freq: Every day | ORAL | Status: DC
  Administered 2015-02-16 – 2015-02-21 (×6): 5 mg via ORAL
  Filled 2015-02-15 (×7): qty 1

## 2015-02-15 MED ORDER — FUROSEMIDE 10 MG/ML IJ SOLN
INTRAMUSCULAR | Status: AC
  Administered 2015-02-15: 20 mg via INTRAVENOUS
  Filled 2015-02-15: qty 2

## 2015-02-15 MED ORDER — LABETALOL HCL 100 MG PO TABS: 100.0000 mg | ORAL_TABLET | Freq: Two times a day (BID) | ORAL | Status: DC

## 2015-02-15 MED ORDER — METHOCARBAMOL 500 MG PO TABS
500.0000 mg | ORAL_TABLET | Freq: Two times a day (BID) | ORAL | Status: DC
  Administered 2015-02-16 – 2015-02-21 (×5): 500 mg via ORAL
  Filled 2015-02-15 (×9): qty 1

## 2015-02-15 MED ORDER — ACETAMINOPHEN 650 MG RE SUPP: 650.0000 mg | Freq: Four times a day (QID) | RECTAL | Status: DC | PRN

## 2015-02-15 MED ORDER — BIMATOPROST 0.01 % OP SOLN
1.0000 [drp] | Freq: Every day | OPHTHALMIC | Status: DC
  Administered 2015-02-15 – 2015-02-20 (×6): 1 [drp] via OPHTHALMIC
  Filled 2015-02-15: qty 2.5

## 2015-02-15 MED ORDER — ALLOPURINOL 100 MG PO TABS: 200.0000 mg | ORAL_TABLET | Freq: Two times a day (BID) | ORAL | Status: DC

## 2015-02-15 MED ORDER — ATORVASTATIN CALCIUM 40 MG PO TABS
40.0000 mg | ORAL_TABLET | Freq: Every day | ORAL | Status: DC
  Administered 2015-02-15 – 2015-02-20 (×6): 40 mg via ORAL
  Filled 2015-02-15 (×6): qty 1

## 2015-02-15 MED ORDER — FAMOTIDINE 20 MG PO TABS
10.0000 mg | ORAL_TABLET | Freq: Every day | ORAL | Status: DC
  Administered 2015-02-15 – 2015-02-21 (×7): 10 mg via ORAL
  Filled 2015-02-15 (×7): qty 1

## 2015-02-15 MED ORDER — ISOSORBIDE DINITRATE 10 MG PO TABS: 10.0000 mg | ORAL_TABLET | Freq: Three times a day (TID) | ORAL | Status: DC

## 2015-02-15 MED ORDER — ACETAMINOPHEN 325 MG PO TABS
650.0000 mg | ORAL_TABLET | Freq: Four times a day (QID) | ORAL | Status: DC | PRN
  Administered 2015-02-19 – 2015-02-20 (×3): 650 mg via ORAL
  Filled 2015-02-15 (×3): qty 2

## 2015-02-15 MED ORDER — CALCITRIOL 0.25 MCG PO CAPS
0.2500 ug | ORAL_CAPSULE | ORAL | Status: DC
  Administered 2015-02-16 – 2015-02-20 (×3): 0.25 ug via ORAL
  Filled 2015-02-15 (×3): qty 1

## 2015-02-15 MED ORDER — OXYCODONE-ACETAMINOPHEN 5-325 MG PO TABS
1.0000 | ORAL_TABLET | ORAL | Status: DC | PRN
Start: 2015-02-15 — End: 2015-02-16

## 2015-02-15 MED ORDER — TIMOLOL MALEATE 0.5 % OP SOLN
1.0000 [drp] | Freq: Two times a day (BID) | OPHTHALMIC | Status: DC
  Administered 2015-02-15 – 2015-02-21 (×12): 1 [drp] via OPHTHALMIC
  Filled 2015-02-15 (×2): qty 5

## 2015-02-15 MED ORDER — FUROSEMIDE 10 MG/ML IJ SOLN
40.0000 mg | Freq: Once | INTRAMUSCULAR | Status: AC
  Administered 2015-02-15: 40 mg via INTRAVENOUS
  Filled 2015-02-15: qty 4

## 2015-02-15 MED ORDER — CALCITRIOL 0.5 MCG PO CAPS
0.5000 ug | ORAL_CAPSULE | ORAL | Status: DC
  Administered 2015-02-15 – 2015-02-21 (×4): 0.5 ug via ORAL
  Filled 2015-02-15: qty 1
  Filled 2015-02-15 (×3): qty 2

## 2015-02-15 MED ORDER — RANOLAZINE ER 500 MG PO TB12: 500.0000 mg | ORAL_TABLET | Freq: Two times a day (BID) | ORAL | Status: DC

## 2015-02-15 MED ORDER — NITROGLYCERIN 2 % TD OINT
0.5000 [in_us] | TOPICAL_OINTMENT | Freq: Four times a day (QID) | TRANSDERMAL | Status: DC
  Administered 2015-02-15: 0.5 [in_us] via TOPICAL
  Filled 2015-02-15: qty 30

## 2015-02-15 MED ORDER — ONDANSETRON HCL 4 MG/2ML IJ SOLN: 4.0000 mg | Freq: Four times a day (QID) | INTRAMUSCULAR | Status: DC | PRN

## 2015-02-15 MED ORDER — NITROGLYCERIN IN D5W 200-5 MCG/ML-% IV SOLN
0.0000 ug/min | INTRAVENOUS | Status: DC
  Administered 2015-02-15: 5 ug/min via INTRAVENOUS
  Filled 2015-02-15: qty 250

## 2015-02-15 MED ORDER — SODIUM CHLORIDE 0.9 % IV SOLN: INTRAVENOUS | Status: DC

## 2015-02-15 MED ORDER — FUROSEMIDE 10 MG/ML IJ SOLN
INTRAMUSCULAR | Status: AC
  Administered 2015-02-15: 40 mg via INTRAVENOUS
  Filled 2015-02-15: qty 4

## 2015-02-15 MED ORDER — SODIUM BICARBONATE 650 MG PO TABS
1300.0000 mg | ORAL_TABLET | Freq: Two times a day (BID) | ORAL | Status: DC
  Administered 2015-02-15 – 2015-02-21 (×12): 1300 mg via ORAL
  Filled 2015-02-15 (×16): qty 2

## 2015-02-15 MED ORDER — SODIUM CHLORIDE 0.9 % IV SOLN: INTRAVENOUS | Status: DC | PRN

## 2015-02-15 MED ORDER — ASPIRIN EC 81 MG PO TBEC
81.0000 mg | DELAYED_RELEASE_TABLET | Freq: Every evening | ORAL | Status: DC
  Administered 2015-02-15 – 2015-02-20 (×6): 81 mg via ORAL
  Filled 2015-02-15 (×6): qty 1

## 2015-02-15 MED ORDER — CARVEDILOL 6.25 MG PO TABS
6.2500 mg | ORAL_TABLET | Freq: Two times a day (BID) | ORAL | Status: DC
  Administered 2015-02-15 – 2015-02-21 (×12): 6.25 mg via ORAL
  Filled 2015-02-15 (×12): qty 1

## 2015-02-15 MED ORDER — LORAZEPAM 2 MG/ML IJ SOLN: 0.2500 mg | INTRAMUSCULAR | Status: DC | PRN

## 2015-02-15 MED ORDER — WARFARIN - PHARMACIST DOSING INPATIENT: Freq: Every day | Status: DC

## 2015-02-15 MED ORDER — FUROSEMIDE 10 MG/ML IJ SOLN
40.0000 mg | Freq: Once | INTRAMUSCULAR | Status: AC
  Administered 2015-02-15: 40 mg via INTRAVENOUS

## 2015-02-15 MED ORDER — SODIUM POLYSTYRENE SULFONATE 15 GM/60ML PO SUSP
30.0000 g | Freq: Once | ORAL | Status: AC
  Administered 2015-02-15: 30 g via ORAL
  Filled 2015-02-15: qty 120

## 2015-02-15 MED ORDER — TRAMADOL HCL 50 MG PO TABS
50.0000 mg | ORAL_TABLET | Freq: Four times a day (QID) | ORAL | Status: DC | PRN
  Administered 2015-02-17 – 2015-02-18 (×2): 50 mg via ORAL
  Filled 2015-02-15 (×3): qty 1

## 2015-02-15 MED ORDER — LORAZEPAM 2 MG/ML IJ SOLN
INTRAMUSCULAR | Status: AC
  Administered 2015-02-15: 0.25 mg
  Filled 2015-02-15: qty 1

## 2015-02-15 NOTE — Progress Notes (Addendum)
[facility] PROGRESS NOTE  Debbie [name] [id number] DOB: 1950/12/13 DOA: 02/14/2015 PCP: No PCP Per Patient  Assessment/Plan: 1. Possible unstable angina/ACS -Debbie [name] is a 65 year old female with multiple comorbidities, having a history of coronary artery disease per medical records, as well as history of chest pain undergoing Myoview in 2015 that revealed subendocardial scar, possible soft tissue attenuation in the inferior/inferolateral distal anterior and apical walls with mild distal anterior/apical ischemia. -Previous Myoview felt to be low risk, cardiac catheterization was not recommended at the time due to history of renal failure. -Overnight she presents with worsening chest pain, having a repeat EKG this morning showing development of left bundle branch block. -She was started on nitroglycerin drip and transferred to the step down unit. -Cardiology has been consulted. -She has a supratherapeutic INR of 4.16 on this mornings lab work. Hold IV heparin for now.  -Continue beta blocker, statin, antiplatelet therapy with aspirin, continuous cardiac monitoring -Await further recommendations from cardiology.  2.  Acute on chronic renal failure. -Patient having a history of renal transplant in 2010 at [facility]. Has history of chronic kidney disease with baseline creatinine near 2.5. -She presented with chest pain associated with nausea, lab work overnight revealed increased creatinine from her baseline at 3.39. -She was given IV fluids overnight -Plan to continue immunosuppressive therapy with prednisone 5 mg by mouth daily, Prograf 2 mg twice a day and mycophenolate 180 mg by mouth twice a day  3.  History of antiphospholipid syndrome/DVT -She has curly anticoagulated with warfarin presented with supratherapeutic INR of 4.16 on a.m. lab work. -Holding Coumadin for now, pharmacy consulted for warfarin dosing.  4.  History of hypertension. -Blood pressures elevated,  starting nitroglycerin drip  5.  Hyperkalemia. -Likely secondary to renal failure, a.m. lab showing potassium of 5.5.   Code Status: Full code Family Communication: Family not present Disposition Plan: Cardiology consulted, starting nitroglycerin drip, transferring to step down unit   Consultants:  Cardiology   HPI/Subjective: Debbie [name] is a pleasant 65 year-old female with complex past medical history including history of renal transplant in 2010 at [facility], antiphospholipid antibody syndrome, paroxysmal atrial fibrillation, esophageal strictures, history of chest pain, undergoing Myoview on 07/02/2013 that revealed subendocardial scar and possible soft tissue attenuation in the inferior/inferolateral distal anterior and apical walls with mild distal anterior/apical ischemia. She has been seen by cardiology in the past and felt to have a low risk Myoview. Cardiac catheterization was not recommended secondary to renal failure. She has been managed medically with aspirin, statin, Imdur, Ranexa, beta blocker. She presented overnight to the emergency room with complaints of nausea and substernal chest pain. Initial EKG did not reveal acute ischemic changes. Chest pain symptoms continued overnight. Troponins remain negative 2 sets. This morning repeat EKG revealed left bundle branch block not present on prior EKG. She was started on nitroglycerin drip with cardiology consultation.  Objective: Filed Vitals:   02/15/15 0502 02/15/15 0914  BP: 165/65 136/74  Pulse: 75 96  Temp: 97.7 F (36.5 C) 98.3 F (36.8 C)  Resp: 20 22   No intake or output data in the 24 hours ending 02/15/15 0926 Filed Weights   02/15/15 0203  Weight: 65.8 kg (145 lb 1 oz)    Exam:   General:  Patient is having active chest pain, characterized as pressure-like in the retrosternal region, 8 out of 10  Cardiovascular: Tachycardic, regular rate rhythm normal S1-S2  Respiratory: Normal respiratory  effort lungs are clear to auscultation  bilaterally  Abdomen: Soft nontender nondistended  Musculoskeletal: No edema  Data Reviewed: Basic Metabolic Panel:  Recent Labs Lab 02/14/15 1837 02/15/15 0117 02/15/15 0527  NA 133* 132* 134*  K 5.3* 5.3* 5.5*  CL 102 102 105  CO2 19* 18* 19*  GLUCOSE 105* 108* 97  BUN 46* 46* 45*  CREATININE 3.39* 3.31* 3.41*  CALCIUM 10.0 9.6 9.6  MG  --   --  1.8  PHOS  --   --  3.1   Liver Function Tests:  Recent Labs Lab 02/14/15 1837 02/15/15 0527  AST 18 18  ALT 10* 9*  ALKPHOS 95 86  BILITOT 0.8 0.9  PROT 6.0* 5.8*  ALBUMIN 2.9* 2.8*   No results for input(s): LIPASE, AMYLASE in the last 168 hours. No results for input(s): AMMONIA in the last 168 hours. CBC:  Recent Labs Lab 02/14/15 1837 02/15/15 0527  WBC 6.3 6.3  NEUTROABS 4.7  --   HGB 8.6* 8.5*  HCT 27.4* 26.3*  MCV 82.8 83.0  PLT 199 198   Cardiac Enzymes:  Recent Labs Lab 02/15/15 0117 02/15/15 0527  TROPONINI <0.03 <0.03   BNP (last 3 results) No results for input(s): BNP in the last 8760 hours.  ProBNP (last 3 results) No results for input(s): PROBNP in the last 8760 hours.  CBG:  Recent Labs Lab 02/15/15 0201  GLUCAP 97    No results found for this or any previous visit (from the past 240 hour(s)).   Studies: Ct Head Wo Contrast  02/14/2015  CLINICAL DATA:  Dizziness and nausea this morning. Similar of stones previously. EXAM: CT HEAD WITHOUT CONTRAST TECHNIQUE: Contiguous axial images were obtained from the base of the skull through the vertex without intravenous contrast. COMPARISON:  There is no evidence of mass effect, midline shift, or extra-axial fluid collections. There is no evidence of a space-occupying lesion or intracranial hemorrhage. There is no evidence of a cortical-based area of acute infarction. There is generalized cerebral atrophy. There is periventricular white matter low attenuation likely secondary to microangiopathy. The  ventricles and sulci are appropriate for the patient's age. The basal cisterns are patent. Visualized portions of the orbits are unremarkable. The visualized portions of the paranasal sinuses and mastoid air cells are unremarkable. Cerebrovascular atherosclerotic calcifications are noted. The osseous structures are unremarkable. FINDINGS: 1. No acute intracranial pathology. 2. Chronic microvascular disease and cerebral atrophy. Electronically Signed   By: Kathreen Devoid   On: 02/14/2015 18:19    Scheduled Meds: . sodium chloride   Intravenous STAT  . allopurinol  200 mg Oral BID  . aspirin EC  81 mg Oral QPM  . [START ON 02/16/2015] calcitRIOL  0.25 mcg Oral QODAY  . calcitRIOL  0.5 mcg Oral QODAY  . famotidine  10 mg Oral Daily  . isosorbide dinitrate  10 mg Oral TID  . labetalol  100 mg Oral BID  . methocarbamol  500 mg Oral BID  . mycophenolate  180 mg Oral BID  . predniSONE  5 mg Oral Q breakfast  . ranolazine  500 mg Oral BID  . sodium bicarbonate  1,300 mg Oral BID  . sodium chloride  3 mL Intravenous Q12H  . tacrolimus  2 mg Oral BID  . Warfarin - Pharmacist Dosing Inpatient   Does not apply q1800   Continuous Infusions: . sodium chloride    . nitroGLYCERIN      Active Problems:   Kidney transplant as cause of abnormal reaction or later complication  PAF (paroxysmal atrial fibrillation) (HCC)   HTN (hypertension)   Chronic anticoagulation - with Coumadin   AKI (acute kidney injury) ([place])   Acute renal failure superimposed on stage 3 chronic kidney disease ([place])   Dehydration   Chest pain   Acute on chronic renal failure ([place])    Time spent: 40 min    Kelvin Cellar  [facility] Pager [phone]. If 7PM-7AM, please contact night-coverage at [email or web address], password [facility] 02/15/2015, 9:26 AM  LOS: 1 day

## 2015-02-15 NOTE — Progress Notes (Signed)
Follow-up note  Patient reporting shortness of breath, on my reassessment she had worsening bibasilar crackles. She had been receiving IV fluids for acute on chronic renal failure. I stopped IV fluids and ordered 40 mg IV Lasix x 1. Will also order a stat CT of chest to assess for other life threatening causes of her severe chest pain.

## 2015-02-15 NOTE — Progress Notes (Signed)
Attempt report to ED nurse said she will call back

## 2015-02-15 NOTE — ED Notes (Signed)
Called Phlebotomy about patient's blood.  Unable to obtain from IV site.

## 2015-02-15 NOTE — Progress Notes (Signed)
[place] for Coumadin Indication: atrial fibrillation  Allergies  Allergen Reactions  . Feraheme [Ferumoxytol] Other (See Comments)    Chest pain, pulsating  . Vancomycin Anaphylaxis, Itching, Swelling and Other (See Comments)    Tongue swell  . Dorzolamide Hcl-Timolol Mal Rash and Other (See Comments)    Eye pain  . Gentamycin [Gentamicin Sulfate] Itching and Swelling  . Latanoprost Rash and Other (See Comments)    Eye pain  . Cefazolin Itching  . Codeine Itching  . Hydrocodone-Acetaminophen Itching  . Penicillins Itching    Has patient had a PCN reaction causing immediate rash, facial/tongue/throat swelling, SOB or lightheadedness with hypotension:No Has patient had a PCN reaction causing severe rash involving mucus membranes or skin necrosis:No Has patient had a PCN reaction that required hospitalization:No Has patient had a PCN reaction occurring within the last 10 years:No If all of the above answers are "NO", then may proceed with Cephalosporin use.     Patient Measurements: Height: 5\' 1"  (154.9 cm) Weight: 145 lb 1 oz (65.8 kg) IBW/kg (Calculated) : 47.8  Vital Signs: Temp: 100.6 F (38.1 C) (01/24 1205) Temp Source: Axillary (01/24 1205) BP: 120/92 mmHg (01/24 1100) Pulse Rate: 114 (01/24 1100)  Labs:  Recent Labs  02/14/15 1837 02/15/15 0117 02/15/15 0527 02/15/15 1209  HGB 8.6*  --  8.5*  --   HCT 27.4*  --  26.3*  --   PLT 199  --  198  --   LABPROT  --  39.1*  --  37.7*  INR  --  4.16*  --  3.96*  CREATININE 3.39* 3.31* 3.41*  --   TROPONINI  --  <0.03 <0.03  --     Estimated Creatinine Clearance: 14.3 mL/min (by C-G formula based on Cr of 3.41).   Medical History: Past Medical History  Diagnosis Date  . Kidney transplant as cause of abnormal reaction or later complication   . Hypertension   . Colon polyps   . Gall stones 1980  . Anti-phospholipid antibody syndrome [facility])     (Based on hospital  discharge summary march 2013)  . Paroxysmal atrial fibrillation (HCC)   . Carpal tunnel syndrome 2003    lt  . Esophageal stricture   . Hemorrhoids   . Renal disorder   . ESRD (end stage renal disease) on dialysis [facility])     "til I got my transplant in 2010"  . DVT (deep venous thrombosis) (HCC) 1990    LLE  . History of blood transfusion     "related to the lupus"  . Dyslipidemia 07/22/2013  . Coronary artery disease   . Heart murmur     mild MR on echo  . Pneumonia     "3 times now" (08/25/2013)  . GERD (gastroesophageal reflux disease)   . Arthritis     "fingers" (08/25/2013)  . Glaucoma, left eye   . Bilateral carotid artery stenosis     1-39%  . Pulmonary HTN (HCC)     PASP 33mmHg    Medications:  Prescriptions prior to admission  Medication Sig Dispense Refill Last Dose  . acetaminophen (TYLENOL) 500 MG tablet Take 1,000 mg by mouth every 6 (six) hours as needed for moderate pain (pain).   unk  . allopurinol (ZYLOPRIM) 100 MG tablet Take 200 mg by mouth 2 (two) times daily.   02/14/2015 at Unknown time  . aspirin EC 81 MG tablet Take 81 mg by mouth every evening.  02/13/2015 at Unknown time  . bimatoprost (LUMIGAN) 0.01 % SOLN Place 1 drop into both eyes at bedtime.    02/13/2015 at Unknown time  . Brinzolamide-Brimonidine (SIMBRINZA) 1-0.2 % SUSP Place 1 drop into the left eye 3 (three) times daily.   02/14/2015 at Unknown time  . calcitRIOL (ROCALTROL) 0.25 MCG capsule Take 0.25-0.5 mcg by mouth daily. Take .25 mcg - .37mcg by mouth daily, alternating with .5 mcg.c   02/12/2015 at Unknown time  . furosemide (LASIX) 20 MG tablet Take 20 mg by mouth daily. Excess edema   02/14/2015 at Unknown time  . isosorbide dinitrate (ISORDIL) 10 MG tablet TAKE 1 TABLET BY MOUTH THREE TIMES DAILY 270 tablet 3 02/14/2015 at Unknown time  . labetalol (NORMODYNE) 200 MG tablet Take 100 mg by mouth 2 (two) times daily.    02/14/2015 at 0930  . MAGNESIUM-OXIDE 400 (241.3 MG) MG tablet TAKE 1 TABLET BY  MOUTH TWICE DAILY 60 tablet 3 02/14/2015 at Unknown time  . methocarbamol (ROBAXIN) 500 MG tablet Take 1 tablet (500 mg total) by mouth 2 (two) times daily. 20 tablet 0 02/14/2015 at Unknown time  . mycophenolate (MYFORTIC) 180 MG EC tablet Take 180 mg by mouth 2 (two) times daily.    02/14/2015 at Unknown time  . nitroGLYCERIN (NITROSTAT) 0.4 MG SL tablet Place 0.4 mg under the tongue every 5 (five) minutes as needed for chest pain.    couple months  . oxyCODONE-acetaminophen (PERCOCET/ROXICET) 5-325 MG tablet Take 1 tablet by mouth every 4 (four) hours as needed for severe pain. 6 tablet 0 02/13/2015 at Unknown time  . predniSONE (DELTASONE) 5 MG tablet Take 5 mg by mouth daily with breakfast.    02/14/2015 at Unknown time  . RANEXA 500 MG 12 hr tablet TAKE 1 TABLET BY MOUTH TWICE DAILY 180 tablet 1 02/14/2015 at Unknown time  . ranitidine (ZANTAC) 150 MG tablet Take 150 mg by mouth daily.    02/14/2015 at Unknown time  . sodium bicarbonate 650 MG tablet Take 1,300 mg by mouth 2 (two) times daily.    02/14/2015 at Unknown time  . tacrolimus (PROGRAF) 1 MG capsule Take 2 mg by mouth 2 (two) times daily.    02/14/2015 at Unknown time  . timolol (TIMOPTIC) 0.5 % ophthalmic solution Place 1 drop into both eyes 2 (two) times daily.    02/14/2015 at Unknown time  . traMADol (ULTRAM) 50 MG tablet Take 1 tablet (50 mg total) by mouth every 6 (six) hours as needed. (Patient taking differently: Take 50 mg by mouth every 6 (six) hours as needed for moderate pain. ) 15 tablet 0 Past Month at Unknown time  . warfarin (COUMADIN) 2.5 MG tablet Take 2.5 mg by mouth daily at 6 PM. Monday and Friday 2.5 mg and 1.25 mg on all other days   02/13/2015 at Unknown time    Assessment: 65 y.o. female admitted with ARF/dizziness, h/o Afib/DVT, to continue Coumadin.  INR supratherapeutic tonight.  1400 PM> INR remains supratherapeutic at 3.9.  Goal of Therapy:  INR 2-3 Monitor platelets by anticoagulation protocol: Yes   Plan:   F/U daily INR  Uvaldo Rising, BCPS  Clinical Pharmacist Pager [phone]  02/15/2015 2:33 PM

## 2015-02-15 NOTE — ED Notes (Signed)
Phlebotomy at bedside.

## 2015-02-15 NOTE — Progress Notes (Signed)
Paged by nurse regarding trop of 13. I checked on the patient, she was sleeping at the night, i woke her up, she denies any CP or SOB. She is no longer on O2, her O2 sat 92% on room air. She has h/o abnormal myoview. However she also has worsening renal function with Cr > 3 and INR 3.9. She is very poor candidate for any invasive workup unless she is willing to go on dialysis. This maybe addressed later. I did not initiate IV heparin since her INR is high. She is on coumadin. Will continue trend enzyme. Her echo this morning shows decline in her EF, she likely has underlying CAD, question now is whether medical therapy vs invasive workup. Again in order to consider invasive workup, she will likely be on dialysis. Since she is chest pain free at this time, we will continue to monitor overnight with serial trop.   Hilbert Corrigan PA Pager: [phone]

## 2015-02-15 NOTE — Progress Notes (Signed)
02/15/2015 11:14 AM  This is a late entry.  Around 0830 pt started c/o substernal chest pain, 8/10, not any different than the pain she was admitted with.  She denied SOB.  Vitals were stable.  Pt given morphine per PRN order.  At this point Dr. Coralyn Pear had come in to assess the patient.  Orders received for EKG and nitro paste.  Nitro paste applied to left arm per order, and EKG obtained and relayed to Dr. Coralyn Pear.  Vitals at this point still stable, however, patient began complaining that her chest pain had gotten worse and that she was becoming SOB.  Oxygen at 4L applied due to oxygen saturations at 86%.  Dr. Coralyn Pear was notified and he assessed the patient again.  Orders received for nitro gtt and transfer to stepdown.  Rapid response RN was called at this time to assist with management of patient and with transfer.  Rapid response RN started and managed nitro gtt.  Upon assessing the patient RN also noticed bibasilar crackles that were not present previously.  Dr. Coralyn Pear again notified and came to assess the patient again.  Lasix ordered at this time.  Bed became available on 2H19 and report was called to [place], Therapist, sports.  Pt then transferred to [place].    Princella Pellegrini

## 2015-02-15 NOTE — ED Notes (Signed)
Attempted report x1. 

## 2015-02-15 NOTE — Procedures (Signed)
Pt has been removed from BiPAP by RN.  Pt is now on room air without any distress, resting comfortably.  RT will continue to monitor pt and will placed the pt on BiPAP if needed or requested.

## 2015-02-15 NOTE — Significant Event (Signed)
Rapid Response Event Note  Overview: Time Called: 0920 Arrival Time: 0925    Initial Focused Assessment: Patient with complaints of CP during night and this am.  Per MD new Left Bundle Branch on EKG this am. Now also complaining of shortness of breath. BP 136/74  HR 102  RR 28-32  O2 sat 86-91 on RA Lung sounds with crackles, heart tones regular Patient sitting on the side of the bed with mild increased WOB, able to speak in short sentences.  Interventions: Dr Coralyn Pear notified of crackles and increased WOB. 40mg  lasix given IV Nitro gtt started at 3mcg Placed on 4L [place] O2 sat 99%  Patient continues with SOB and increasing WOB, now using accessory muscles. Patient unable to lie flat for Ct chest, MD aware Patient transported to 2H19 via bed with O2 and heart monitor. Plan: Bipap and Cards consult. RN to call if assistance needed.  Event Summary: Name of Physician Notified: Dr Coralyn Pear at bedside at      at    Outcome: Transferred ([place]) [phone])  Event End Time: [place]  Raliegh Ip

## 2015-02-15 NOTE — Progress Notes (Signed)
Echocardiogram 2D Echocardiogram has been performed.  Joelene Millin 02/15/2015, 12:12 PM

## 2015-02-15 NOTE — Progress Notes (Signed)
ANTICOAGULATION CONSULT NOTE - Initial Consult  Pharmacy Consult for Coumadin Indication: atrial fibrillation  Allergies  Allergen Reactions  . Feraheme [Ferumoxytol] Other (See Comments)    Chest pain, pulsating  . Vancomycin Anaphylaxis, Itching, Swelling and Other (See Comments)    Tongue swell  . Dorzolamide Hcl-Timolol Mal Rash and Other (See Comments)    Eye pain  . Gentamycin [Gentamicin Sulfate] Itching and Swelling  . Latanoprost Rash and Other (See Comments)    Eye pain  . Cefazolin Itching  . Codeine Itching  . Hydrocodone-Acetaminophen Itching  . Penicillins Itching    Has patient had a PCN reaction causing immediate rash, facial/tongue/throat swelling, SOB or lightheadedness with hypotension:No Has patient had a PCN reaction causing severe rash involving mucus membranes or skin necrosis:No Has patient had a PCN reaction that required hospitalization:No Has patient had a PCN reaction occurring within the last 10 years:No If all of the above answers are "NO", then may proceed with Cephalosporin use.     Patient Measurements: Height: 5\' 1"  (154.9 cm) Weight: 145 lb 1 oz (65.8 kg) IBW/kg (Calculated) : 47.8  Vital Signs: Temp: 98.9 F (37.2 C) (01/24 0203) Temp Source: Oral (01/24 0203) BP: 151/61 mmHg (01/24 0203) Pulse Rate: 78 (01/24 0203)  Labs:  Recent Labs  02/14/15 1837 02/15/15 0117  HGB 8.6*  --   HCT 27.4*  --   PLT 199  --   LABPROT  --  39.1*  INR  --  4.16*  CREATININE 3.39* 3.31*    Estimated Creatinine Clearance: 14.7 mL/min (by C-G formula based on Cr of 3.31).   Medical History: Past Medical History  Diagnosis Date  . Kidney transplant as cause of abnormal reaction or later complication   . Hypertension   . Colon polyps   . Gall stones 1980  . Anti-phospholipid antibody syndrome [facility])     (Based on hospital discharge summary march 2013)  . Paroxysmal atrial fibrillation (HCC)   . Carpal tunnel syndrome 2003    lt  .  Esophageal stricture   . Hemorrhoids   . Renal disorder   . ESRD (end stage renal disease) on dialysis [facility])     "til I got my transplant in 2010"  . DVT (deep venous thrombosis) (HCC) 1990    LLE  . History of blood transfusion     "related to the lupus"  . Dyslipidemia 07/22/2013  . Coronary artery disease   . Heart murmur     mild MR on echo  . Pneumonia     "3 times now" (08/25/2013)  . GERD (gastroesophageal reflux disease)   . Arthritis     "fingers" (08/25/2013)  . Glaucoma, left eye   . Bilateral carotid artery stenosis     1-39%  . Pulmonary HTN (HCC)     PASP 35mmHg    Medications:  Prescriptions prior to admission  Medication Sig Dispense Refill Last Dose  . acetaminophen (TYLENOL) 500 MG tablet Take 1,000 mg by mouth every 6 (six) hours as needed for moderate pain (pain).   unk  . allopurinol (ZYLOPRIM) 100 MG tablet Take 200 mg by mouth 2 (two) times daily.   02/14/2015 at Unknown time  . aspirin EC 81 MG tablet Take 81 mg by mouth every evening.    02/13/2015 at Unknown time  . bimatoprost (LUMIGAN) 0.01 % SOLN Place 1 drop into both eyes at bedtime.    02/13/2015 at Unknown time  . Brinzolamide-Brimonidine (SIMBRINZA) 1-0.2 % SUSP Place 1  drop into the left eye 3 (three) times daily.   02/14/2015 at Unknown time  . calcitRIOL (ROCALTROL) 0.25 MCG capsule Take 0.25-0.5 mcg by mouth daily. Take .25 mcg - .34mcg by mouth daily, alternating with .5 mcg.c   02/12/2015 at Unknown time  . furosemide (LASIX) 20 MG tablet Take 20 mg by mouth daily. Excess edema   02/14/2015 at Unknown time  . isosorbide dinitrate (ISORDIL) 10 MG tablet TAKE 1 TABLET BY MOUTH THREE TIMES DAILY 270 tablet 3 02/14/2015 at Unknown time  . labetalol (NORMODYNE) 200 MG tablet Take 100 mg by mouth 2 (two) times daily.    02/14/2015 at 0930  . MAGNESIUM-OXIDE 400 (241.3 MG) MG tablet TAKE 1 TABLET BY MOUTH TWICE DAILY 60 tablet 3 02/14/2015 at Unknown time  . methocarbamol (ROBAXIN) 500 MG tablet Take 1 tablet  (500 mg total) by mouth 2 (two) times daily. 20 tablet 0 02/14/2015 at Unknown time  . mycophenolate (MYFORTIC) 180 MG EC tablet Take 180 mg by mouth 2 (two) times daily.    02/14/2015 at Unknown time  . nitroGLYCERIN (NITROSTAT) 0.4 MG SL tablet Place 0.4 mg under the tongue every 5 (five) minutes as needed for chest pain.    couple months  . oxyCODONE-acetaminophen (PERCOCET/ROXICET) 5-325 MG tablet Take 1 tablet by mouth every 4 (four) hours as needed for severe pain. 6 tablet 0 02/13/2015 at Unknown time  . predniSONE (DELTASONE) 5 MG tablet Take 5 mg by mouth daily with breakfast.    02/14/2015 at Unknown time  . RANEXA 500 MG 12 hr tablet TAKE 1 TABLET BY MOUTH TWICE DAILY 180 tablet 1 02/14/2015 at Unknown time  . ranitidine (ZANTAC) 150 MG tablet Take 150 mg by mouth daily.    02/14/2015 at Unknown time  . sodium bicarbonate 650 MG tablet Take 1,300 mg by mouth 2 (two) times daily.    02/14/2015 at Unknown time  . tacrolimus (PROGRAF) 1 MG capsule Take 2 mg by mouth 2 (two) times daily.    02/14/2015 at Unknown time  . timolol (TIMOPTIC) 0.5 % ophthalmic solution Place 1 drop into both eyes 2 (two) times daily.    02/14/2015 at Unknown time  . traMADol (ULTRAM) 50 MG tablet Take 1 tablet (50 mg total) by mouth every 6 (six) hours as needed. (Patient taking differently: Take 50 mg by mouth every 6 (six) hours as needed for moderate pain. ) 15 tablet 0 Past Month at Unknown time  . warfarin (COUMADIN) 2.5 MG tablet Take 2.5 mg by mouth daily at 6 PM. Monday and Friday 2.5 mg and 1.25 mg on all other days   02/13/2015 at Unknown time    Assessment: 65 y.o. female admitted with ARF/dizziness, h/o Afib/DVT, to continue Coumadin.  INR supratherapeutic tonight.  Goal of Therapy:  INR 2-3 Monitor platelets by anticoagulation protocol: Yes   Plan:  F/U daily INR  Fernand Sorbello, Bronson Curb 02/15/2015,2:07 AM

## 2015-02-15 NOTE — Consult Note (Signed)
CARDIOLOGY CONSULT NOTE   Patient ID: Debbie [name] MRN: [id number] DOB/AGE: 02/20/1950 65 y.o.  Admit date: 02/14/2015  Primary Physician   No PCP Per Patient Primary Cardiologist   Dr Radford Pax now, was Dr Chancy Milroy in [facility] Reason for Consultation   SOB, chest pain  [id number] Debbie [name] is a 65 y.o. year old female with a history of chest pain, last MV 06/2013 w/ some ischemia (results below), but medical therapy recommended due to poor renal function. Also with history of HTN, HLD, PAF on coumadin, SLE, positive anticardiolipin antibody, ESRD s/p renal transplant 2010, DVT. Severe pulmonary HTN w/ PCWP 24 mmHg at R heart cath 08/2013.  Mechanical fall 01/23/2015 w/ RLE oblique fracture through the distal fibular diaphysis and oblique medial malleolar fracture, in a non-weight-bearing cast.  She came to the hospital 01/23 for dizziness, Debbie headache. She was found to have worsening renal function, hyperkalemia w/ K+ 5.3 (now 5.5), BUN/Cr were 36/2.57 11/2014, 46/3.39 on admission, 45.3/41 today. She was felt to be dehydrated and was given IVF at 125 cc/hr. INR was elevated at 4.16.   In ER, pt developed chest pain, tightness. Pt refused SL NTG, was given ASA. Pain resolved, pt stated she would get this at times, worse with lying down. ECG and initial troponin negative. Started on NTG paste.   She had worsening chest pain overnight, ECG w/ dramatic increase in QRS duration, 115>>188 ms. She was started on IV NTG and given 1 dose of MSO4. She was transferred to stepdown. About 9 am, her SOB became acutely worse. She was given Lasix 40 mg IV, then 20 mg IV. Her SOB did not improve and she was started on BiPAP. On BiPAP, she is not able to talk much but is breathing better. CXR pending.    Past Medical History  Diagnosis Date  . Kidney transplant as cause of abnormal reaction or later complication   . Hypertension   . Colon polyps   . Gall stones 1980  . 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 Paroxysmal atrial fibrillation (HCC)   . Carpal tunnel syndrome 2003    lt  . Esophageal stricture   . Hemorrhoids   . Renal disorder   . ESRD (end stage renal disease) on dialysis [facility])     "til I got my transplant in 2010"  . DVT (deep venous thrombosis) (HCC) 1990    LLE  . History of blood transfusion     "related to the lupus"  . Dyslipidemia 07/22/2013  . Coronary artery disease   . Heart murmur     mild MR on echo  . Pneumonia     "3 times now" (08/25/2013)  . GERD (gastroesophageal reflux disease)   . Arthritis     "fingers" (08/25/2013)  . Glaucoma, left eye   . Bilateral carotid artery stenosis     1-39%  . Pulmonary HTN (HCC)     PASP 70mmHg     Past Surgical History  Procedure Laterality Date  . Nephrectomy transplanted organ  05/2008  . Cholecystectomy  1980  . Hematoma evacuation  1998    "after they took peritoneal catheter out"  . Peritoneal catheter insertion    . Peritoneal catheter removal    . Carpal tunnel release Right 07/2001  . Arteriovenous graft placement    . Thrombectomy and revision of arterioventous (av) goretex  graft Right 07/1999; 04/2002;  09/2002; 12/2005; 09/2007;     upper arm/notes 07/31/1999; 05/18/2002; 10/07/2002; 01/14/2006; 09/14/2007;   . Right heart catheterization N/A 08/28/2013    Procedure: RIGHT HEART CATH;  Surgeon: Sinclair Grooms, MD;  Location: [facility] CATH LAB;  Service: Cardiovascular;  Laterality: N/A;    Allergies  Allergen Reactions  . Feraheme [Ferumoxytol] Other (See Comments)    Chest pain, pulsating  . Vancomycin Anaphylaxis, Itching, Swelling and Other (See Comments)    Tongue swell  . Dorzolamide Hcl-Timolol Mal Rash and Other (See Comments)    Eye pain  . Gentamycin [Gentamicin Sulfate] Itching and Swelling  . Latanoprost Rash and Other (See Comments)    Eye pain  . Cefazolin Itching  . Codeine Itching  . Hydrocodone-Acetaminophen Itching  . Penicillins Itching     Has patient had a PCN reaction causing immediate rash, facial/tongue/throat swelling, SOB or lightheadedness with hypotension:No Has patient had a PCN reaction causing severe rash involving mucus membranes or skin necrosis:No Has patient had a PCN reaction that required hospitalization:No Has patient had a PCN reaction occurring within the last 10 years:No If all of the above answers are "NO", then may proceed with Cephalosporin use.     I have reviewed the patient's current medications . sodium chloride   Intravenous STAT  . allopurinol  200 mg Oral BID  . aspirin EC  81 mg Oral QPM  . atorvastatin  40 mg Oral q1800  . [START ON 02/16/2015] calcitRIOL  0.25 mcg Oral QODAY  . calcitRIOL  0.5 mcg Oral QODAY  . famotidine  10 mg Oral Daily  . furosemide      . furosemide  20 mg Intravenous Once  . labetalol  100 mg Oral BID  . methocarbamol  500 mg Oral BID  . mycophenolate  180 mg Oral BID  . predniSONE  5 mg Oral Q breakfast  . ranolazine  500 mg Oral BID  . sodium bicarbonate  1,300 mg Oral BID  . sodium chloride  3 mL Intravenous Q12H  . tacrolimus  2 mg Oral BID  . Warfarin - Pharmacist Dosing Inpatient   Does not apply q1800   . nitroGLYCERIN 5 mcg/min (02/15/15 0939)   acetaminophen **OR** acetaminophen, morphine injection, ondansetron **OR** ondansetron (ZOFRAN) IV, oxyCODONE-acetaminophen, traMADol  Prior to Admission medications   Medication Sig Start Date End Date Taking? Authorizing Provider  acetaminophen (TYLENOL) 500 MG tablet Take 1,000 mg by mouth every 6 (six) hours as needed for moderate pain (pain).   Yes Historical Provider, MD  allopurinol (ZYLOPRIM) 100 MG tablet Take 200 mg by mouth 2 (two) times daily. 01/10/15  Yes Historical Provider, MD  aspirin EC 81 MG tablet Take 81 mg by mouth every evening.    Yes Historical Provider, MD  bimatoprost (LUMIGAN) 0.01 % SOLN Place 1 drop into both eyes at bedtime.  05/21/13  Yes Historical Provider, MD   Brinzolamide-Brimonidine (SIMBRINZA) 1-0.2 % SUSP Place 1 drop into the left eye 3 (three) times daily. 09/14/14  Yes Historical Provider, MD  calcitRIOL (ROCALTROL) 0.25 MCG capsule Take 0.25-0.5 mcg by mouth daily. Take .25 mcg - .36mcg by mouth daily, alternating with .5 mcg.c   Yes Historical Provider, MD  furosemide (LASIX) 20 MG tablet Take 20 mg by mouth daily. Excess edema   Yes Historical Provider, MD  isosorbide dinitrate (ISORDIL) 10 MG tablet TAKE 1 TABLET BY MOUTH THREE TIMES DAILY 03/04/14  Yes Doreene Burke Kilroy, PA-C  labetalol (NORMODYNE) 200 MG tablet Take 100  mg by mouth 2 (two) times daily.    Yes Historical Provider, MD  MAGNESIUM-OXIDE 400 (241.3 MG) MG tablet TAKE 1 TABLET BY MOUTH TWICE DAILY 10/26/14  Yes Sueanne Margarita, MD  methocarbamol (ROBAXIN) 500 MG tablet Take 1 tablet (500 mg total) by mouth 2 (two) times daily. 01/23/15  Yes Marella Chimes, PA-C  mycophenolate (MYFORTIC) 180 MG EC tablet Take 180 mg by mouth 2 (two) times daily.    Yes Historical Provider, MD  nitroGLYCERIN (NITROSTAT) 0.4 MG SL tablet Place 0.4 mg under the tongue every 5 (five) minutes as needed for chest pain.  08/26/13  Yes Historical Provider, MD  oxyCODONE-acetaminophen (PERCOCET/ROXICET) 5-325 MG tablet Take 1 tablet by mouth every 4 (four) hours as needed for severe pain. 01/23/15  Yes Marella Chimes, PA-C  predniSONE (DELTASONE) 5 MG tablet Take 5 mg by mouth daily with breakfast.  04/24/12  Yes Historical Provider, MD  RANEXA 500 MG 12 hr tablet TAKE 1 TABLET BY MOUTH TWICE DAILY 01/11/15  Yes Sueanne Margarita, MD  ranitidine (ZANTAC) 150 MG tablet Take 150 mg by mouth daily.    Yes Historical Provider, MD  sodium bicarbonate 650 MG tablet Take 1,300 mg by mouth 2 (two) times daily.  07/13/11  Yes Historical Provider, MD  tacrolimus (PROGRAF) 1 MG capsule Take 2 mg by mouth 2 (two) times daily.    Yes Historical Provider, MD  timolol (TIMOPTIC) 0.5 % ophthalmic solution Place 1 drop into both eyes  2 (two) times daily.  05/21/13  Yes Historical Provider, MD  traMADol (ULTRAM) 50 MG tablet Take 1 tablet (50 mg total) by mouth every 6 (six) hours as needed. Patient taking differently: Take 50 mg by mouth every 6 (six) hours as needed for moderate pain.  01/23/15  Yes Marella Chimes, PA-C  warfarin (COUMADIN) 2.5 MG tablet Take 2.5 mg by mouth daily at 6 PM. Monday and Friday 2.5 mg and 1.25 mg on all other days 07/10/11  Yes Historical Provider, MD     Social History   Social History  . Marital Status: Widowed    Spouse Name: N/A  . Number of Children: 3  . Years of Education: N/A   Occupational History  . disabled    Social History Main Topics  . Smoking status: Former Smoker -- 0.00 packs/day for 30 years    Types: Cigarettes    Quit date: 04/16/1977  . Smokeless tobacco: Never Used  . Alcohol Use: Yes  . Drug Use: No  . Sexual Activity: Not Currently   Other Topics Concern  . Not on file   Social History Narrative    Family Status  Relation Status Death Age  . Mother Deceased   . Maternal Grandmother Deceased   . Maternal Grandfather Deceased   . Paternal Grandmother Deceased   . Paternal Grandfather Deceased   . Father Deceased    Family History  Problem Relation Age of Onset  . Hypertension Mother   . Heart disease Mother   . Colon cancer Neg Hx   . Rectal cancer Neg Hx   . Stomach cancer Neg Hx      ROS:  Full 14 point review of systems complete and found to be negative unless listed above.  Physical Exam: Blood pressure 122/81, pulse 102, temperature 98.3 F (36.8 C), temperature source Oral, resp. rate 32, height 5\' 1"  (1.549 m), weight 145 lb 1 oz (65.8 kg), SpO2 99 %.  General: Well developed, well  nourished, female in acute respiratory distress Head: Eyes PERRLA, No xanthomas.   Normocephalic and atraumatic, oropharynx without edema or exudate. Dentition: poor Lungs: bilateral rales, increased work of breathing Heart: HRRR S1 S2, no  rub/gallop, No murmur heard, but respirations are noisy. pulses are 1-2+ bothe lower extrem. Radial pulses not palpable, hands are warm, cap refill OK Neck: No carotid bruits. No lymphadenopathy.  JVD to jaw. Abdomen: Bowel sounds present, abdomen soft and non-tender without masses or hernias noted. Msk:  No spine or cva tenderness. No weakness, no joint deformities or effusions. Extremities: No clubbing or cyanosis. Trace LE edema. HD access bilateral UE, the one on the R is working, not being used Neuro: Alert and oriented X 3. No focal deficits noted. Psych:  anxious affect (2nd SOB), responds appropriately Skin: No rashes or lesions noted.  Labs:   Lab Results  Component Value Date   WBC 6.3 02/15/2015   HGB 8.5* 02/15/2015   HCT 26.3* 02/15/2015   MCV 83.0 02/15/2015   PLT 198 02/15/2015    Recent Labs  02/15/15 0117  INR 4.16*    Recent Labs Lab 02/15/15 0527  NA 134*  K 5.5*  CL 105  CO2 19*  BUN 45*  CREATININE 3.41*  CALCIUM 9.6  PROT 5.8*  BILITOT 0.9  ALKPHOS 86  ALT 9*  AST 18  GLUCOSE 97  ALBUMIN 2.8*   MAGNESIUM  Date Value Ref Range Status  02/15/2015 1.8 1.7 - 2.4 mg/dL Final    Recent Labs  02/15/15 0117 02/15/15 0527  TROPONINI <0.03 <0.03   B NATRIURETIC PEPTIDE  Date/Time Value Ref Range Status  02/04/2014 02:39 PM 1016.9* 0.0 - 100.0 pg/mL Final    Comment:    Please note change in reference range.   PRO B NATRIURETIC PEPTIDE (BNP)  Date/Time Value Ref Range Status  08/27/2013 12:50 AM 20481.0* 0 - 125 pg/mL Final  04/17/2011 09:34 PM 3723.0* 0 - 125 pg/mL Final   Lab Results  Component Value Date   CHOL 146 10/28/2014   HDL 52 10/28/2014   LDLCALC 78 10/28/2014   TRIG 81 10/28/2014   Lab Results  Component Value Date   DDIMER 0.40 08/24/2013   TSH  Date/Time Value Ref Range Status  02/15/2015 05:27 AM 1.966 0.350 - 4.500 uIU/mL Final  08/24/2013 08:50 PM 5.730* 0.350 - 4.500 uIU/mL Final   VITAMIN B-12  Date/Time  Value Ref Range Status  02/15/2015 05:27 AM 264 180 - 914 pg/mL Final   FOLATE  Date/Time Value Ref Range Status  02/15/2015 05:27 AM 13.6 >5.9 ng/mL Final   FERRITIN  Date/Time Value Ref Range Status  02/15/2015 05:27 AM 1128* 11 - 307 ng/mL Final   TIBC  Date/Time Value Ref Range Status  02/15/2015 05:27 AM 169* 250 - 450 ug/dL Final   IRON  Date/Time Value Ref Range Status  02/15/2015 05:27 AM 12* 28 - 170 ug/dL Final   RETIC CT PCT  Date/Time Value Ref Range Status  02/15/2015 05:27 AM 2.2 0.4 - 3.1 % Final    Echo: 08/07/2013 - Left ventricle: The cavity size was normal. Wall thickness was normal. Systolic function was normal. The estimated ejection fraction was in the range of 55% to 60%. - Mitral valve: There was mild regurgitation. - Left atrium: The atrium was severely dilated. - Right atrium: The atrium was mildly dilated. - Pulmonary arteries: PA peak pressure: 82 mm Hg (S).  Myoview: 07/02/2013 IMPRESSION: Lexiscan myoview: Electrically positive. Myoview scan with  evidence of subendocardial scar and possible soft tissue attenuation in the inferior/inferolateral distal anterior and apical walls. with mild distal anterior/apical ischemia. LVEF 54%  ECG:  02/15/2015 SR, QRS duration 188 ms (QRS duration 02/14/2015 was 115 ms)  Radiology:   Ct Head Wo Contrast 02/14/2015  CLINICAL DATA:  Dizziness and nausea this morning. Similar of stones previously. EXAM: CT HEAD WITHOUT CONTRAST TECHNIQUE: Contiguous axial images were obtained from the base of the skull through the vertex without intravenous contrast. COMPARISON:  There is no evidence of mass effect, midline shift, or extra-axial fluid collections. There is no evidence of a space-occupying lesion or intracranial hemorrhage. There is no evidence of a cortical-based area of acute infarction. There is generalized cerebral atrophy. There is periventricular white matter low attenuation likely secondary to  microangiopathy. The ventricles and sulci are appropriate for the patient's age. The basal cisterns are patent. Visualized portions of the orbits are unremarkable. The visualized portions of the paranasal sinuses and mastoid air cells are unremarkable. Cerebrovascular atherosclerotic calcifications are noted. The osseous structures are unremarkable. FINDINGS: 1. No acute intracranial pathology. 2. Chronic microvascular disease and cerebral atrophy. Electronically Signed   By: Kathreen Devoid   On: 02/14/2015 18:19    ASSESSMENT AND PLAN:   The patient was seen today by Dr Marlou Porch, the patient evaluated and the data reviewed.      SOB - fairly acute onset - pt has put out 350 cc after a total of 60 mg Lasix - follow I/O closely, may need to give 100 mg Lasix BID for diuresis - CXR pending, probably CHF based on exam - do not think PE a possibility based on supratherapeutic INR - diurese - echo ordered as a stat, f/u on results. - EF has been normal in the past, ez negative for MI so far, continue to follow    Chest pain - concerning for angina - got worse today - ECG w/ new LBBB - MV abnl in 2015, no previous cath due to poor renal function - medical therapy with ASA, NTG, statin, BB. - change labetalol to Coreg 6.25 mg bid - **preliminary echo results w/ EF approx 45%, + anteroseptal/apical WMA    Chronic anticoagulation - with Coumadin - for anticardiolipin antibody, hx DVT and PAF - supratherapeutic on admission - OK to continue per pharmacy as we cannot do any invasive testing due to poor renal function  Otherwise, per IM. With worsening renal function and worsening respiratory status, feel Renal consult would be helpful.  Active Problems:   Kidney transplant as cause of abnormal reaction or later complication   PAF (paroxysmal atrial fibrillation) (HCC)   HTN (hypertension)   AKI (acute kidney injury) ([place])   Acute renal failure superimposed on stage 3 chronic kidney disease  ([place])   Dehydration   Chest pain   Acute on chronic renal failure [facility])   SignedRosaria Ferries, PA-C 02/15/2015 10:54 AM Beeper [id number]  Co-Sign MD  Personally seen and examined. Agree with above.  65 year old female with renal transplant, lupus, pulmonary hypertension, deconditioned with shortness of breath, acute systolic heart failure.  Acute systolic heart failure  - Echocardiogram shows asynchronous contraction secondary to bundle branch block which is new and ejection fraction that is approximately 45%.  - IV Lasix  - BiPAP if cardiac catheterization takes place, high risk for worsening renal impairment and potential for hemodialysis.  - Aggressively continue medical therapy, control fluid status and hopefully we will be able to stabilize her.  -  Troponin thus far is normal.   Acute kidney injury  - Creatinine 3.3, 2.572 months ago, 1.9-5 months ago  - Renal transplant  - Nephrology consult by primary team  Hypoalbuminemia  - 2.9 albumin. Poor protein intake  Chronic anticoagulation   - supratherapeutic INR 3.96   chest pain   - no longer with chest discomfort. Mostly respiratory distress  - Interval ventricular conduction delay noted on EKG, widened QRS. Echo with distal anteroseptal wall hypokinesis.   - optimally, we would pursue cardiac catheterization however we are hesitant secondary to her transplanted kidney and recent worsening renal injury.  Candee Furbish, MD

## 2015-02-15 NOTE — ED Notes (Signed)
Reminded lab about blood before patient leaves the unit.

## 2015-02-15 NOTE — Consult Note (Signed)
Reason for Consult:AKI/CKD Referring Physician: Dr. [name] is an 65 y.o. female.  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  Her renal failure required chronic dialysis and then received a renal transplantation on Jun 14, 2008. She has been maintained on routine chronic immunosuppression with tacrolimus and mycophenolate with some chronic allograft nephropathy and CKD with baseline Scr of 2.2-2.7. She recently fell at home on 01/23/15 and suffered a right fibula fracture and medial malleolar fx and is in a non-weight-bearing cast.  She presented to [facility] on 02/14/15 after she awoke with dizziness and associated nausea.  While in the ED she developed SSCP and SOB.  An ECG was obtained and cardiology was consulted.  Overnight she had worsening SOB and given IV lasix and started on BiPap with improved oxygen saturations.  We have been asked to see the patient after she developed some CHF and AKI/CKD.  The trend in Scr is seen below.    Trend in Creatinine: CREATININE, SER  Date/Time Value Ref Range Status  02/15/2015 05:27 AM 3.41* 0.44 - 1.00 mg/dL Final  02/15/2015 01:17 AM 3.31* 0.44 - 1.00 mg/dL Final  02/14/2015 06:37 PM 3.39* 0.44 - 1.00 mg/dL Final  12/01/2014 06:38 AM 2.57* 0.44 - 1.00 mg/dL Final  09/02/2014 11:25 AM 1.92* 0.40 - 1.20 mg/dL Final  06/02/2014 08:25 PM 2.70* 0.44 - 1.00 mg/dL Final  05/06/2014 03:54 PM 2.70* 0.50 - 1.10 mg/dL Final  04/19/2014 05:52 PM 2.27* 0.50 - 1.10 mg/dL Final  04/08/2014 01:00 PM 2.21* 0.50 - 1.10 mg/dL Final  02/04/2014 02:39 PM 2.99* 0.50 - 1.10 mg/dL Final  08/30/2013 04:15 AM 2.59* 0.50 - 1.10 mg/dL Final  08/29/2013 04:20 AM 2.68* 0.50 -  1.10 mg/dL Final  08/28/2013 01:10 PM 2.63* 0.50 - 1.10 mg/dL Final  08/27/2013 07:10 AM 2.61* 0.50 - 1.10 mg/dL Final  08/27/2013 12:50 AM 2.62* 0.50 - 1.10 mg/dL Final  08/25/2013 01:50 AM 2.35* 0.50 - 1.10 mg/dL Final  08/24/2013 09:50 AM 2.35* 0.50 - 1.10 mg/dL Final  07/06/2013 05:05 AM 2.05* 0.50 - 1.10 mg/dL Final  07/05/2013 04:25 AM 2.21* 0.50 - 1.10 mg/dL Final  07/04/2013 03:45 AM 2.04* 0.50 - 1.10 mg/dL Final  07/03/2013 03:30 AM 1.92* 0.50 - 1.10 mg/dL Final  07/02/2013 05:09 AM 1.98* 0.50 - 1.10 mg/dL Final  07/01/2013 04:24 AM 1.88* 0.50 - 1.10 mg/dL Final  06/30/2013 02:50 PM 1.87* 0.50 - 1.10 mg/dL Final  12/04/2012 06:25 PM 1.70* 0.50 - 1.10 mg/dL Final  11/01/2011 11:16 AM 1.52* 0.50 - 1.10 mg/dL Final  04/19/2011 05:24 AM 1.55* 0.50 - 1.10 mg/dL Final  04/18/2011 05:00 AM 1.62* 0.50 - 1.10 mg/dL Final  04/17/2011 05:19 PM 1.59* 0.50 - 1.10 mg/dL Final  12/23/2008 12:40 PM 1.66* 0.4 - 1.2 mg/dL Final    PMH:   Past Medical History  Diagnosis Date  . Kidney transplant as cause of abnormal reaction or later complication   . Hypertension   . Colon polyps   . Gall stones 1980  . Anti-phospholipid antibody syndrome [facility])     (Based on hospital discharge summary march 2013)  . Paroxysmal atrial fibrillation (HCC)   . Carpal tunnel syndrome 2003    lt  .  Esophageal stricture   . Hemorrhoids   . Renal disorder   . ESRD (end stage renal disease) on dialysis [facility])     "til I got my transplant in 2010"  . DVT (deep venous thrombosis) (HCC) 1990    LLE  . History of blood transfusion     "related to the lupus"  . Dyslipidemia 07/22/2013  . Coronary artery disease   . Heart murmur     mild MR on echo  . Pneumonia     "3 times now" (08/25/2013)  . GERD (gastroesophageal reflux disease)   . Arthritis     "fingers" (08/25/2013)  . Glaucoma, left eye   . Bilateral carotid artery stenosis     1-39%  . Pulmonary HTN (HCC)     PASP 9mmHg    PSH:   Past Surgical  History  Procedure Laterality Date  . Nephrectomy transplanted organ  05/2008  . Cholecystectomy  1980  . Hematoma evacuation  1998    "after they took peritoneal catheter out"  . Peritoneal catheter insertion    . Peritoneal catheter removal    . Carpal tunnel release Right 07/2001  . Arteriovenous graft placement    . Thrombectomy and revision of arterioventous (av) goretex  graft Right 07/1999; 04/2002; 09/2002; 12/2005; 09/2007;     upper arm/notes 07/31/1999; 05/18/2002; 10/07/2002; 01/14/2006; 09/14/2007;   . Right heart catheterization N/A 08/28/2013    Procedure: RIGHT HEART CATH;  Surgeon: Sinclair Grooms, MD;  Location: [facility] CATH LAB;  Service: Cardiovascular;  Laterality: N/A;    Allergies:  Allergies  Allergen Reactions  . Feraheme [Ferumoxytol] Other (See Comments)    Chest pain, pulsating  . Vancomycin Anaphylaxis, Itching, Swelling and Other (See Comments)    Tongue swell  . Dorzolamide Hcl-Timolol Mal Rash and Other (See Comments)    Eye pain  . Gentamycin [Gentamicin Sulfate] Itching and Swelling  . Latanoprost Rash and Other (See Comments)    Eye pain  . Cefazolin Itching  . Codeine Itching  . Hydrocodone-Acetaminophen Itching  . Penicillins Itching    Has patient had a PCN reaction causing immediate rash, facial/tongue/throat swelling, SOB or lightheadedness with hypotension:No Has patient had a PCN reaction causing severe rash involving mucus membranes or skin necrosis:No Has patient had a PCN reaction that required hospitalization:No Has patient had a PCN reaction occurring within the last 10 years:No If all of the above answers are "NO", then may proceed with Cephalosporin use.     Medications:   Prior to Admission medications   Medication Sig Start Date End Date Taking? Authorizing Provider  acetaminophen (TYLENOL) 500 MG tablet Take 1,000 mg by mouth every 6 (six) hours as needed for moderate pain (pain).   Yes Historical Provider, MD  allopurinol (ZYLOPRIM) 100  MG tablet Take 200 mg by mouth 2 (two) times daily. 01/10/15  Yes Historical Provider, MD  aspirin EC 81 MG tablet Take 81 mg by mouth every evening.    Yes Historical Provider, MD  bimatoprost (LUMIGAN) 0.01 % SOLN Place 1 drop into both eyes at bedtime.  05/21/13  Yes Historical Provider, MD  Brinzolamide-Brimonidine (SIMBRINZA) 1-0.2 % SUSP Place 1 drop into the left eye 3 (three) times daily. 09/14/14  Yes Historical Provider, MD  calcitRIOL (ROCALTROL) 0.25 MCG capsule Take 0.25-0.5 mcg by mouth daily. Take .25 mcg - .50mcg by mouth daily, alternating with .5 mcg.c   Yes Historical Provider, MD  furosemide (LASIX) 20 MG tablet Take 20 mg by mouth  daily. Excess edema   Yes Historical Provider, MD  isosorbide dinitrate (ISORDIL) 10 MG tablet TAKE 1 TABLET BY MOUTH THREE TIMES DAILY 03/04/14  Yes Doreene Burke Kilroy, PA-C  labetalol (NORMODYNE) 200 MG tablet Take 100 mg by mouth 2 (two) times daily.    Yes Historical Provider, MD  MAGNESIUM-OXIDE 400 (241.3 MG) MG tablet TAKE 1 TABLET BY MOUTH TWICE DAILY 10/26/14  Yes Sueanne Margarita, MD  methocarbamol (ROBAXIN) 500 MG tablet Take 1 tablet (500 mg total) by mouth 2 (two) times daily. 01/23/15  Yes Marella Chimes, PA-C  mycophenolate (MYFORTIC) 180 MG EC tablet Take 180 mg by mouth 2 (two) times daily.    Yes Historical Provider, MD  nitroGLYCERIN (NITROSTAT) 0.4 MG SL tablet Place 0.4 mg under the tongue every 5 (five) minutes as needed for chest pain.  08/26/13  Yes Historical Provider, MD  oxyCODONE-acetaminophen (PERCOCET/ROXICET) 5-325 MG tablet Take 1 tablet by mouth every 4 (four) hours as needed for severe pain. 01/23/15  Yes Marella Chimes, PA-C  predniSONE (DELTASONE) 5 MG tablet Take 5 mg by mouth daily with breakfast.  04/24/12  Yes Historical Provider, MD  RANEXA 500 MG 12 hr tablet TAKE 1 TABLET BY MOUTH TWICE DAILY 01/11/15  Yes Sueanne Margarita, MD  ranitidine (ZANTAC) 150 MG tablet Take 150 mg by mouth daily.    Yes Historical Provider, MD   sodium bicarbonate 650 MG tablet Take 1,300 mg by mouth 2 (two) times daily.  07/13/11  Yes Historical Provider, MD  tacrolimus (PROGRAF) 1 MG capsule Take 2 mg by mouth 2 (two) times daily.    Yes Historical Provider, MD  timolol (TIMOPTIC) 0.5 % ophthalmic solution Place 1 drop into both eyes 2 (two) times daily.  05/21/13  Yes Historical Provider, MD  traMADol (ULTRAM) 50 MG tablet Take 1 tablet (50 mg total) by mouth every 6 (six) hours as needed. Patient taking differently: Take 50 mg by mouth every 6 (six) hours as needed for moderate pain.  01/23/15  Yes Marella Chimes, PA-C  warfarin (COUMADIN) 2.5 MG tablet Take 2.5 mg by mouth daily at 6 PM. Monday and Friday 2.5 mg and 1.25 mg on all other days 07/10/11  Yes Historical Provider, MD    Inpatient medications: . sodium chloride   Intravenous STAT  . allopurinol  200 mg Oral BID  . aspirin EC  81 mg Oral QPM  . atorvastatin  40 mg Oral q1800  . [START ON 02/16/2015] calcitRIOL  0.25 mcg Oral QODAY  . calcitRIOL  0.5 mcg Oral QODAY  . carvedilol  6.25 mg Oral BID WC  . famotidine  10 mg Oral Daily  . methocarbamol  500 mg Oral BID  . mycophenolate  180 mg Oral BID  . predniSONE  5 mg Oral Q breakfast  . sodium bicarbonate  1,300 mg Oral BID  . sodium chloride  3 mL Intravenous Q12H  . tacrolimus  2 mg Oral BID  . Warfarin - Pharmacist Dosing Inpatient   Does not apply q1800    Discontinued Meds:   Medications Discontinued During This Encounter  Medication Reason  . 0.9 %  sodium chloride infusion   . aspirin tablet 325 mg   . nitroGLYCERIN (NITROGLYN) 2 % ointment 0.5 inch   . 0.9 %  sodium chloride infusion   . isosorbide dinitrate (ISORDIL) tablet 10 mg   . ranolazine (RANEXA) 12 hr tablet 500 mg   . labetalol (NORMODYNE) tablet 100 mg  Social History:  reports that she quit smoking about 37 years ago. Her smoking use included Cigarettes. She smoked 0.00 packs per day for 30 years. She has never used smokeless  tobacco. She reports that she drinks alcohol. She reports that she does not use illicit drugs.  Family History:   Family History  Problem Relation Age of Onset  . Hypertension Mother   . Heart disease Mother   . Colon cancer Neg Hx   . Rectal cancer Neg Hx   . Stomach cancer Neg Hx     Review of systems not obtained due to patient factors. Weight change:   Intake/Output Summary (Last 24 hours) at 02/15/15 1454 Last data filed at 02/15/15 1300  Gross per 24 hour  Intake 738.61 ml  Output    500 ml  Net 238.61 ml   BP 120/92 mmHg  Pulse 114  Temp(Src) 100.6 F (38.1 C) (Axillary)  Resp 24  Ht 5\' 1"  (1.549 m)  Wt 65.8 kg (145 lb 1 oz)  BMI 27.42 kg/m2  SpO2 100% Filed Vitals:   02/15/15 0943 02/15/15 1000 02/15/15 1100 02/15/15 1205  BP: 132/71 122/81 120/92   Pulse: 103 102 114   Temp:    100.6 F (38.1 C)  TempSrc:    Axillary  Resp: 24 32 24   Height:      Weight:      SpO2: 96% 99% 100%      General appearance: unresponsive and wearing BiPap Head: Normocephalic, without obvious abnormality, atraumatic Resp: clear to auscultation bilaterally and transmitted upper airway sounds from BiPap Cardio: no rub GI: soft, non-tender; bowel sounds normal; no masses,  no organomegaly Extremities: extremities normal, atraumatic, no cyanosis or edema and RUE AVF +T/B, multiple old grafts  Labs: Basic Metabolic Panel:  Recent Labs Lab 02/14/15 1837 02/15/15 0117 02/15/15 0527  NA 133* 132* 134*  K 5.3* 5.3* 5.5*  CL 102 102 105  CO2 19* 18* 19*  GLUCOSE 105* 108* 97  BUN 46* 46* 45*  CREATININE 3.39* 3.31* 3.41*  ALBUMIN 2.9*  --  2.8*  CALCIUM 10.0 9.6 9.6  PHOS  --   --  3.1   Liver Function Tests:  Recent Labs Lab 02/14/15 1837 02/15/15 0527  AST 18 18  ALT 10* 9*  ALKPHOS 95 86  BILITOT 0.8 0.9  PROT 6.0* 5.8*  ALBUMIN 2.9* 2.8*   No results for input(s): LIPASE, AMYLASE in the last 168 hours. No results for input(s): AMMONIA in the last 168  hours. CBC:  Recent Labs Lab 02/14/15 1837 02/15/15 0527  WBC 6.3 6.3  NEUTROABS 4.7  --   HGB 8.6* 8.5*  HCT 27.4* 26.3*  MCV 82.8 83.0  PLT 199 198   PT/INR: @LABRCNTIP (inr:5) Cardiac Enzymes: ) Recent Labs Lab 02/15/15 0117 02/15/15 0527  TROPONINI <0.03 <0.03   CBG:  Recent Labs Lab 02/15/15 0201  GLUCAP 97    Iron Studies:  Recent Labs Lab 02/15/15 0527  IRON 12*  TIBC 169*  FERRITIN 1128*    Xrays/Other Studies: Dg Chest 1 View  02/15/2015  CLINICAL DATA:  65 year old personal history of kidney transplant, currently with chest pain and acute onset of severe shortness of breath earlier today. EXAM: Portable CHEST 1 VIEW COMPARISON:  04/26/2014 and earlier. FINDINGS: Cardiac silhouette mildly to moderately enlarged, unchanged. Moderate diffuse interstitial and airspace pulmonary edema. Small bilateral pleural effusions. IMPRESSION: CHF, with stable cardiomegaly and moderate diffuse interstitial and airspace pulmonary edema associated with small bilateral  effusions. Electronically Signed   By: Evangeline Dakin M.D.   On: 02/15/2015 12:45   Ct Head Wo Contrast  02/14/2015  CLINICAL DATA:  Dizziness and nausea this morning. Similar of stones previously. EXAM: CT HEAD WITHOUT CONTRAST TECHNIQUE: Contiguous axial images were obtained from the base of the skull through the vertex without intravenous contrast. COMPARISON:  There is no evidence of mass effect, midline shift, or extra-axial fluid collections. There is no evidence of a space-occupying lesion or intracranial hemorrhage. There is no evidence of a cortical-based area of acute infarction. There is generalized cerebral atrophy. There is periventricular white matter low attenuation likely secondary to microangiopathy. The ventricles and sulci are appropriate for the patient's age. The basal cisterns are patent. Visualized portions of the orbits are unremarkable. The visualized portions of the paranasal sinuses and  mastoid air cells are unremarkable. Cerebrovascular atherosclerotic calcifications are noted. The osseous structures are unremarkable. FINDINGS: 1. No acute intracranial pathology. 2. Chronic microvascular disease and cerebral atrophy. Electronically Signed   By: Kathreen Devoid   On: 02/14/2015 18:19     Assessment/Plan: 1. AKI/CKD 2. Hyperkalemia due to #1.  Will give kayexalate when she gets off BiPap. 3. SOB related to pulmonary edema/CHF- treated with lasix.  Continue to follow.  Oxygen saturations improved with diuresis and BiPap 4. Respiratory distress- as above 5. Anemia of chronic kidney disease- will likely require ESA, and IV iron (although ferritin is markedly elevated) 6. HTN- stable 7. S/p DDKT with chronic allograft dysfunction- continue with immunosuppressive agents and follow renal function. 8. Vascular access- RUE AVG access +T/B 9. CAD with intermittent CP- Cardiology following. 10. 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  So doubtful that chest pain was PE. 11. Protein malnutrition 12. RLE fxs- non-weight-bearing cast in place   Donetta Potts 02/15/2015, 12:55 PM

## 2015-02-16 ENCOUNTER — Inpatient Hospital Stay (HOSPITAL_COMMUNITY): Payer: Medicare Other

## 2015-02-16 DIAGNOSIS — D6859 Other primary thrombophilia: Secondary | ICD-10-CM | POA: Diagnosis present

## 2015-02-16 DIAGNOSIS — Z87891 Personal history of nicotine dependence: Secondary | ICD-10-CM | POA: Diagnosis not present

## 2015-02-16 DIAGNOSIS — I5043 Acute on chronic combined systolic (congestive) and diastolic (congestive) heart failure: Secondary | ICD-10-CM | POA: Diagnosis not present

## 2015-02-16 DIAGNOSIS — T8610 Unspecified complication of kidney transplant: Secondary | ICD-10-CM | POA: Diagnosis not present

## 2015-02-16 DIAGNOSIS — E46 Unspecified protein-calorie malnutrition: Secondary | ICD-10-CM | POA: Diagnosis present

## 2015-02-16 DIAGNOSIS — N189 Chronic kidney disease, unspecified: Secondary | ICD-10-CM

## 2015-02-16 DIAGNOSIS — I5021 Acute systolic (congestive) heart failure: Secondary | ICD-10-CM | POA: Diagnosis not present

## 2015-02-16 DIAGNOSIS — I130 Hypertensive heart and chronic kidney disease with heart failure and stage 1 through stage 4 chronic kidney disease, or unspecified chronic kidney disease: Secondary | ICD-10-CM | POA: Diagnosis present

## 2015-02-16 DIAGNOSIS — Z86718 Personal history of other venous thrombosis and embolism: Secondary | ICD-10-CM | POA: Diagnosis not present

## 2015-02-16 DIAGNOSIS — I272 Other secondary pulmonary hypertension: Secondary | ICD-10-CM | POA: Diagnosis present

## 2015-02-16 DIAGNOSIS — N184 Chronic kidney disease, stage 4 (severe): Secondary | ICD-10-CM | POA: Diagnosis present

## 2015-02-16 DIAGNOSIS — I10 Essential (primary) hypertension: Secondary | ICD-10-CM | POA: Diagnosis not present

## 2015-02-16 DIAGNOSIS — M329 Systemic lupus erythematosus, unspecified: Secondary | ICD-10-CM | POA: Diagnosis present

## 2015-02-16 DIAGNOSIS — H409 Unspecified glaucoma: Secondary | ICD-10-CM | POA: Diagnosis present

## 2015-02-16 DIAGNOSIS — E876 Hypokalemia: Secondary | ICD-10-CM | POA: Diagnosis not present

## 2015-02-16 DIAGNOSIS — E875 Hyperkalemia: Secondary | ICD-10-CM | POA: Diagnosis present

## 2015-02-16 DIAGNOSIS — Z8249 Family history of ischemic heart disease and other diseases of the circulatory system: Secondary | ICD-10-CM | POA: Diagnosis not present

## 2015-02-16 DIAGNOSIS — W19XXXD Unspecified fall, subsequent encounter: Secondary | ICD-10-CM | POA: Diagnosis present

## 2015-02-16 DIAGNOSIS — T8619 Other complication of kidney transplant: Secondary | ICD-10-CM | POA: Diagnosis present

## 2015-02-16 DIAGNOSIS — E785 Hyperlipidemia, unspecified: Secondary | ICD-10-CM | POA: Diagnosis present

## 2015-02-16 DIAGNOSIS — D631 Anemia in chronic kidney disease: Secondary | ICD-10-CM | POA: Insufficient documentation

## 2015-02-16 DIAGNOSIS — N183 Chronic kidney disease, stage 3 (moderate): Secondary | ICD-10-CM

## 2015-02-16 DIAGNOSIS — I214 Non-ST elevation (NSTEMI) myocardial infarction: Secondary | ICD-10-CM | POA: Insufficient documentation

## 2015-02-16 DIAGNOSIS — I447 Left bundle-branch block, unspecified: Secondary | ICD-10-CM | POA: Diagnosis present

## 2015-02-16 DIAGNOSIS — N179 Acute kidney failure, unspecified: Secondary | ICD-10-CM | POA: Diagnosis not present

## 2015-02-16 DIAGNOSIS — Z885 Allergy status to narcotic agent status: Secondary | ICD-10-CM | POA: Diagnosis not present

## 2015-02-16 DIAGNOSIS — R0789 Other chest pain: Secondary | ICD-10-CM | POA: Diagnosis not present

## 2015-02-16 DIAGNOSIS — I480 Paroxysmal atrial fibrillation: Secondary | ICD-10-CM | POA: Diagnosis present

## 2015-02-16 DIAGNOSIS — E8809 Other disorders of plasma-protein metabolism, not elsewhere classified: Secondary | ICD-10-CM | POA: Diagnosis present

## 2015-02-16 DIAGNOSIS — I209 Angina pectoris, unspecified: Secondary | ICD-10-CM | POA: Diagnosis not present

## 2015-02-16 DIAGNOSIS — R42 Dizziness and giddiness: Secondary | ICD-10-CM | POA: Diagnosis present

## 2015-02-16 DIAGNOSIS — Z888 Allergy status to other drugs, medicaments and biological substances status: Secondary | ICD-10-CM | POA: Diagnosis not present

## 2015-02-16 DIAGNOSIS — K219 Gastro-esophageal reflux disease without esophagitis: Secondary | ICD-10-CM | POA: Diagnosis present

## 2015-02-16 DIAGNOSIS — Z880 Allergy status to penicillin: Secondary | ICD-10-CM | POA: Diagnosis not present

## 2015-02-16 DIAGNOSIS — S82841D Displaced bimalleolar fracture of right lower leg, subsequent encounter for closed fracture with routine healing: Secondary | ICD-10-CM | POA: Diagnosis not present

## 2015-02-16 DIAGNOSIS — R8271 Bacteriuria: Secondary | ICD-10-CM | POA: Diagnosis present

## 2015-02-16 DIAGNOSIS — E860 Dehydration: Secondary | ICD-10-CM | POA: Diagnosis present

## 2015-02-16 DIAGNOSIS — J9601 Acute respiratory failure with hypoxia: Secondary | ICD-10-CM | POA: Diagnosis not present

## 2015-02-16 DIAGNOSIS — Z86711 Personal history of pulmonary embolism: Secondary | ICD-10-CM | POA: Diagnosis not present

## 2015-02-16 DIAGNOSIS — I25119 Atherosclerotic heart disease of native coronary artery with unspecified angina pectoris: Secondary | ICD-10-CM | POA: Diagnosis present

## 2015-02-16 DIAGNOSIS — Z7952 Long term (current) use of systemic steroids: Secondary | ICD-10-CM | POA: Diagnosis not present

## 2015-02-16 DIAGNOSIS — D649 Anemia, unspecified: Secondary | ICD-10-CM | POA: Diagnosis not present

## 2015-02-16 DIAGNOSIS — E43 Unspecified severe protein-calorie malnutrition: Secondary | ICD-10-CM | POA: Diagnosis present

## 2015-02-16 DIAGNOSIS — M1990 Unspecified osteoarthritis, unspecified site: Secondary | ICD-10-CM | POA: Diagnosis present

## 2015-02-16 DIAGNOSIS — Z6827 Body mass index (BMI) 27.0-27.9, adult: Secondary | ICD-10-CM | POA: Diagnosis not present

## 2015-02-16 DIAGNOSIS — I429 Cardiomyopathy, unspecified: Secondary | ICD-10-CM | POA: Diagnosis present

## 2015-02-16 DIAGNOSIS — Z7901 Long term (current) use of anticoagulants: Secondary | ICD-10-CM | POA: Diagnosis not present

## 2015-02-16 DIAGNOSIS — Z79899 Other long term (current) drug therapy: Secondary | ICD-10-CM | POA: Diagnosis not present

## 2015-02-16 LAB — CBC
HEMATOCRIT: 26.8 % — AB (ref 36.0–46.0)
HEMOGLOBIN: 8.7 g/dL — AB (ref 12.0–15.0)
MCH: 27.4 pg (ref 26.0–34.0)
MCHC: 32.5 g/dL (ref 30.0–36.0)
MCV: 84.3 fL (ref 78.0–100.0)
Platelets: 177 10*3/uL (ref 150–400)
RBC: 3.18 MIL/uL — ABNORMAL LOW (ref 3.87–5.11)
RDW: 16.7 % — ABNORMAL HIGH (ref 11.5–15.5)
WBC: 7.6 10*3/uL (ref 4.0–10.5)

## 2015-02-16 LAB — PROTIME-INR
INR: 3.76 — AB (ref 0.00–1.49)
Prothrombin Time: 36.3 seconds — ABNORMAL HIGH (ref 11.6–15.2)

## 2015-02-16 LAB — BASIC METABOLIC PANEL
ANION GAP: 11 (ref 5–15)
BUN: 49 mg/dL — ABNORMAL HIGH (ref 6–20)
CO2: 19 mmol/L — AB (ref 22–32)
Calcium: 9.3 mg/dL (ref 8.9–10.3)
Chloride: 104 mmol/L (ref 101–111)
Creatinine, Ser: 3.64 mg/dL — ABNORMAL HIGH (ref 0.44–1.00)
GFR calc Af Amer: 14 mL/min — ABNORMAL LOW (ref >60)
GFR, EST NON AFRICAN AMERICAN: 12 mL/min — AB (ref >60)
GLUCOSE: 123 mg/dL — AB (ref 65–99)
Potassium: 4 mmol/L (ref 3.5–5.1)
Sodium: 134 mmol/L — ABNORMAL LOW (ref 135–145)

## 2015-02-16 LAB — TROPONIN I
TROPONIN I: 9.78 ng/mL — AB (ref <0.031)
Troponin I: 13.48 ng/mL (ref <0.031)
Troponin I: 7.34 ng/mL (ref <0.031)

## 2015-02-16 LAB — URINE CULTURE
Culture: >=100000
SPECIAL REQUESTS: NORMAL

## 2015-02-16 MED ORDER — DARBEPOETIN ALFA 60 MCG/0.3ML IJ SOSY
60.0000 ug | PREFILLED_SYRINGE | INTRAMUSCULAR | Status: DC
  Administered 2015-02-16: 60 ug via SUBCUTANEOUS
  Filled 2015-02-16: qty 0.3

## 2015-02-16 NOTE — Progress Notes (Signed)
[facility] for Coumadin Indication: atrial fibrillation  Labs:  Recent Labs  02/14/15 1837  02/15/15 0117 02/15/15 0527 02/15/15 1209 02/15/15 1827 02/16/15 0524  HGB 8.6*  --   --  8.5*  --   --  8.7*  HCT 27.4*  --   --  26.3*  --   --  26.8*  PLT 199  --   --  198  --   --  177  LABPROT  --   --  39.1*  --  37.7*  --  36.3*  INR  --   --  4.16*  --  3.96*  --  3.76*  CREATININE 3.39*  --  3.31* 3.41*  --   --  3.64*  TROPONINI  --   < > <0.03 <0.03  --  13.13* 13.48*  < > = values in this interval not displayed.  Estimated Creatinine Clearance: 13.4 mL/min (by C-G formula based on Cr of 3.64).  Assessment: 65 y.o. female admitted with h/o Afib/DVT, was on warfarin PTA. INR on admission supratherapeuticat 3.86. Pharmacy consulted to dose warfarin.   INR 3.63 (supratherapeutic), Hgb 8.7, plt wnl  Goal of Therapy:  INR 2-3 Monitor platelets by anticoagulation protocol: Yes   Plan:  Continue to hold warfarin and monitor INR daily Patient needs cath but has poor renal function. Will follow cardio plan Pharmacy will monitor peripherally, reconsult when warfarin is to be restarted.  Darl Pikes, PharmD Clinical Pharmacist- Resident Pager: [phone]  02/16/2015 10:13 AM

## 2015-02-16 NOTE — Progress Notes (Signed)
Cardiologist: Dr. Radford Pax  Subjective:   Debbie [name] is a 65 y.o. year old female with a history of chest pain, last MV 06/2013 w/ some ischemia (results below), but medical therapy recommended due to poor renal function. Also with history of HTN, HLD, PAF on coumadin, SLE, positive anticardiolipin antibody, ESRD s/p renal transplant 2010, DVT. Severe pulmonary HTN w/ PCWP 24 mmHg at R heart cath 08/2013. Mechanical fall 01/23/2015 w/ RLE oblique fracture through the distal fibular diaphysis and oblique medial malleolar fracture, in a non-weight-bearing cast.  On 02/15/15 to develop respiratory distress requiring BiPAP, mild dose of Ativan. Troponin elevation consistent with non-ST elevation myocardial infarction, wide QRS, EF moderately reduced on echocardiogram.  Feeling better, less shortness of breath, off BiPAP, on 2 L nasal cannula. She's not having any active chest pain. She is surrounded by friends/family.  Objective:  Vital Signs in the last 24 hours: Temp:  [98.4 F (36.9 C)-100.6 F (38.1 C)] 99.7 F (37.6 C) (01/25 0800) Pulse Rate:  [78-114] 89 (01/25 0900) Resp:  [14-29] 20 (01/25 0900) BP: (102-168)/(53-92) 139/73 mmHg (01/25 0900) SpO2:  [89 %-100 %] 100 % (01/25 0900) FiO2 (%):  [40 %] 40 % (01/24 1200)  Intake/Output from previous day: 01/24 0701 - 01/25 0700 In: 1286.6 [P.O.:480; I.V.:806.6] Out: 1300 [Urine:1300]   Physical Exam: General: Well developed, well nourished, in no acute distress, somewhat frail. Head:  Normocephalic and atraumatic. Lungs: No significant crackles or wheezes heard on exam. Heart: Normal S1 and S2.  No murmur, rubs or gallops.  Abdomen: soft, non-tender, positive bowel sounds. Extremities: No clubbing or cyanosis. No significant edema. Neurologic: Alert and oriented x 3.    Lab Results:  Recent Labs  02/15/15 0527 02/16/15 0524  WBC 6.3 7.6  HGB 8.5* 8.7*  PLT 198 177    Recent Labs  02/15/15 0527  02/16/15 0524  NA 134* 134*  K 5.5* 4.0  CL 105 104  CO2 19* 19*  GLUCOSE 97 123*  BUN 45* 49*  CREATININE 3.41* 3.64*    Recent Labs  02/15/15 1827 02/16/15 0524  TROPONINI 13.13* 13.48*   Hepatic Function Panel  Recent Labs  02/15/15 0527  PROT 5.8*  ALBUMIN 2.8*  AST 18  ALT 9*  ALKPHOS 86  BILITOT 0.9   No results for input(s): CHOL in the last 72 hours. No results for input(s): PROTIME in the last 72 hours.  Imaging: Dg Chest 1 View  02/15/2015  CLINICAL DATA:  65 year old personal history of kidney transplant, currently with chest pain and acute onset of severe shortness of breath earlier today. EXAM: Portable CHEST 1 VIEW COMPARISON:  04/26/2014 and earlier. FINDINGS: Cardiac silhouette mildly to moderately enlarged, unchanged. Moderate diffuse interstitial and airspace pulmonary edema. Small bilateral pleural effusions. IMPRESSION: CHF, with stable cardiomegaly and moderate diffuse interstitial and airspace pulmonary edema associated with small bilateral effusions. Electronically Signed   By: Evangeline Dakin M.D.   On: 02/15/2015 12:45   Ct Head Wo Contrast  02/14/2015  CLINICAL DATA:  Dizziness and nausea this morning. Similar of stones previously. EXAM: CT HEAD WITHOUT CONTRAST TECHNIQUE: Contiguous axial images were obtained from the base of the skull through the vertex without intravenous contrast. COMPARISON:  There is no evidence of mass effect, midline shift, or extra-axial fluid collections. There is no evidence of a space-occupying lesion or intracranial hemorrhage. There is no evidence of a cortical-based area of acute infarction. There is generalized cerebral atrophy. There is periventricular white matter  low attenuation likely secondary to microangiopathy. The ventricles and sulci are appropriate for the patient's age. The basal cisterns are patent. Visualized portions of the orbits are unremarkable. The visualized portions of the paranasal sinuses and  mastoid air cells are unremarkable. Cerebrovascular atherosclerotic calcifications are noted. The osseous structures are unremarkable. FINDINGS: 1. No acute intracranial pathology. 2. Chronic microvascular disease and cerebral atrophy. Electronically Signed   By: Kathreen Devoid   On: 02/14/2015 18:19   Ct Chest Wo Contrast  02/16/2015  CLINICAL DATA:  CHF exacerbation.  Kidney transplant patient. EXAM: CT CHEST WITHOUT CONTRAST TECHNIQUE: Multidetector CT imaging of the chest was performed following the standard protocol without IV contrast. COMPARISON:  08/27/2013 FINDINGS: Cardiac enlargement. Normal caliber thoracic aorta. Prominent Coronary artery and aortic calcifications. Esophagus is decompressed. No significant lymphadenopathy in the chest. Small right pleural effusion. Mild atelectasis in the lung bases. Perihilar airspace changes bilaterally and nodular ground-glass opacities on the right. These changes likely are due to edema or may be infectious or inflammatory. Suggest follow-up in 3 months to confirm resolution.Focal area of atelectasis or scarring in the left mid lung, unchanged since previous study. No pneumothorax. Included portions of the upper abdominal organs demonstrate extensive vascular calcifications in the abdominal aorta and major branch vessels. Bilateral renal atrophy. Surgical absence of the gallbladder. Degenerative changes in the spine. IMPRESSION: Small right pleural effusion. Patchy perihilar infiltrates and nodular infiltrates in the lungs. Changes likely represent infectious or inflammatory changes versus edema. Suggest follow-up in 3 months. Extensive vascular calcifications. Electronically Signed   By: Lucienne Capers M.D.   On: 02/16/2015 03:23   Personally viewed.   Telemetry: No adverse arrhythmias, no VT Personally viewed.   EKG:  EKG this morning shows narrow complex QRS with nonspecific ST-T wave changes. Yesterday she had a widened QRS complex. Personally  viewed.  Cardiac Studies:  Echocardiogram 02/15/15: - Normal LV size with mild LV hypertrophy. EF 40-45% with diffuse hypokinesis, septal-lateral dyssynchrony. Normal RV size and systolic function. Calcified mitral valve and annulus, mild to moderate MR. Elevated mean gradient across the mitral valve probably due to high flow (does not appear to have significant stenosis). Aortic sclerosis without significant stenosis. Mild aortic insufficiency. Moderate pulmonary hypertension.  Meds: Scheduled Meds: . aspirin EC  81 mg Oral QPM  . atorvastatin  40 mg Oral q1800  . bimatoprost  1 drop Both Eyes QHS  . calcitRIOL  0.25 mcg Oral QODAY  . calcitRIOL  0.5 mcg Oral QODAY  . carvedilol  6.25 mg Oral BID WC  . darbepoetin (ARANESP) injection - NON-DIALYSIS  60 mcg Subcutaneous Q Wed-1800  . famotidine  10 mg Oral Daily  . furosemide  40 mg Intravenous Daily  . methocarbamol  500 mg Oral BID  . mycophenolate  180 mg Oral BID  . predniSONE  5 mg Oral Q breakfast  . sodium bicarbonate  1,300 mg Oral BID  . sodium chloride  3 mL Intravenous Q12H  . tacrolimus  2 mg Oral BID  . timolol  1 drop Both Eyes BID  . Warfarin - Pharmacist Dosing Inpatient   Does not apply q1800   Continuous Infusions: . sodium chloride 10 mL/hr (02/16/15 0700)  . nitroGLYCERIN 10 mcg/min (02/16/15 0700)   PRN Meds:.sodium chloride, acetaminophen **OR** acetaminophen, LORazepam, morphine injection, ondansetron **OR** ondansetron (ZOFRAN) IV, oxyCODONE-acetaminophen, traMADol  Assessment/Plan:  Active Problems:   Kidney transplant as cause of abnormal reaction or later complication   PAF (paroxysmal atrial fibrillation) ([place])  HTN (hypertension)   Chronic anticoagulation - with Coumadin   AKI (acute kidney injury) ([place])   Acute renal failure superimposed on stage 3 chronic kidney disease ([facility])   Dehydration   Chest pain   Acute on chronic renal failure ([facility])   Acute systolic heart failure  ([facility])  Non-ST elevation myocardial infarction  - She is supratherapeutic on Coumadin. No heparin  - Continue with beta blocker  - Continue with statin  - Continue with low-dose aspirin  - Troponin peak, 13  - Ejection fraction 40-45% with what appears to be a wall motion abnormality in the distal anteroseptal region.  - Her QRS complex was widened in the setting of her respiratory distress yesterday but now QRS is now narrowed.  - We had lengthy discussion with her and her family and friends about the risks and benefits of potential cardiac catheterization, utilization of IV contrast and its potential implications for worsening acute kidney injury, hemodialysis. This is a very challenging situation. Regardless, we would need to hold her Coumadin if we decided to pursue cardiac catheterization given that she is supra therapeutic. At this point, given the marked improvement in her symptoms, resolution of respiratory distress, I am waiting on the side of continued aggressive medical management. If her creatinine returns back to baseline as it was 5-6 months ago, we could consider cardiac catheterization at that time. Once again, we would have to hold her Coumadin. Another option is if she begins to develop worsening anginal symptoms, more shortness of breath, our hand may be forced in a sense to pursue diagnostic angiogram. Continue to collaborate with nephrology on this matter.  Chronic anticoagulation  - Has history of atrial fibrillation as well as remote DVT  - Currently supratherapeutic.  - Appreciate pharmacy team assistance  Status post renal transplant  - Nephrology  - Medications reviewed  Acute systolic heart failure  - EF 40-45%  - Marked improvement since yesterday.  - Gentle IV Lasix 40 mg daily. Appreciate nephrology's assistance.  Paroxysmal atrial fibrillation  - Current lease sinus rhythm  [name], Debbie 02/16/2015, 10:14 AM

## 2015-02-16 NOTE — Clinical Documentation Improvement (Signed)
Internal Medicine  Can the diagnosis of Protein Malnutrition be further specified with the "severity"? Thank you   Document Severity - Severe(third degree), Moderate (second degree), Mild (first degree)  Other condition  Unable to clinically determine  Document any associated diagnoses/conditions      Please exercise your independent, professional judgment when responding. A specific answer is not anticipated or expected.   Thank You, [place] [phone]

## 2015-02-16 NOTE — Progress Notes (Signed)
Patient ID: Debbie [name], female   DOB: Aug 15, 1950, 65 y.o.   MRN: [id number] S:feels better, no CP or SOB.  O:BP 168/83 mmHg  Pulse 93  Temp(Src) 99.7 F (37.6 C) (Axillary)  Resp 19  Ht 5\' 1"  (1.549 m)  Wt 65.8 kg (145 lb 1 oz)  BMI 27.42 kg/m2  SpO2 100%  Intake/Output Summary (Last 24 hours) at 02/16/15 0859 Last data filed at 02/16/15 0500  Gross per 24 hour  Intake 1273.61 ml  Output   1300 ml  Net -26.39 ml   Intake/Output: I/O last 3 completed shifts: In: 1273.6 [P.O.:480; I.V.:793.6] Out: 1300 [Urine:1300]  Intake/Output this shift:    Weight change:  Gen:WD WN AAF in NAD CVS: nor rub Resp:cta [id number] Ext:no edema, RUE AVG +T/B   Recent Labs Lab 02/14/15 1837 02/15/15 0117 02/15/15 0527 02/16/15 0524  NA 133* 132* 134* 134*  K 5.3* 5.3* 5.5* 4.0  CL 102 102 105 104  CO2 19* 18* 19* 19*  GLUCOSE 105* 108* 97 123*  BUN 46* 46* 45* 49*  CREATININE 3.39* 3.31* 3.41* 3.64*  ALBUMIN 2.9*  --  2.8*  --   CALCIUM 10.0 9.6 9.6 9.3  PHOS  --   --  3.1  --   AST 18  --  18  --   ALT 10*  --  9*  --    Liver Function Tests:  Recent Labs Lab 02/14/15 1837 02/15/15 0527  AST 18 18  ALT 10* 9*  ALKPHOS 95 86  BILITOT 0.8 0.9  PROT 6.0* 5.8*  ALBUMIN 2.9* 2.8*   No results for input(s): LIPASE, AMYLASE in the last 168 hours. No results for input(s): AMMONIA in the last 168 hours. CBC:  Recent Labs Lab 02/14/15 1837 02/15/15 0527 02/16/15 0524  WBC 6.3 6.3 7.6  NEUTROABS 4.7  --   --   HGB 8.6* 8.5* 8.7*  HCT 27.4* 26.3* 26.8*  MCV 82.8 83.0 84.3  PLT 199 198 177   Cardiac Enzymes:  Recent Labs Lab 02/15/15 0117 02/15/15 0527 02/15/15 1827 02/16/15 0524  TROPONINI <0.03 <0.03 13.13* 13.48*   CBG:  Recent Labs Lab 02/15/15 0201  GLUCAP 97    Iron Studies:  Recent Labs  02/15/15 0527  IRON 12*  TIBC 169*  FERRITIN 1128*   Studies/Results: Dg Chest 1 View  02/15/2015  CLINICAL DATA:  65 year old personal history of  kidney transplant, currently with chest pain and acute onset of severe shortness of breath earlier today. EXAM: Portable CHEST 1 VIEW COMPARISON:  04/26/2014 and earlier. FINDINGS: Cardiac silhouette mildly to moderately enlarged, unchanged. Moderate diffuse interstitial and airspace pulmonary edema. Small bilateral pleural effusions. IMPRESSION: CHF, with stable cardiomegaly and moderate diffuse interstitial and airspace pulmonary edema associated with small bilateral effusions. Electronically Signed   By: Evangeline Dakin M.D.   On: 02/15/2015 12:45   Ct Head Wo Contrast  02/14/2015  CLINICAL DATA:  Dizziness and nausea this morning. Similar of stones previously. EXAM: CT HEAD WITHOUT CONTRAST TECHNIQUE: Contiguous axial images were obtained from the base of the skull through the vertex without intravenous contrast. COMPARISON:  There is no evidence of mass effect, midline shift, or extra-axial fluid collections. There is no evidence of a space-occupying lesion or intracranial hemorrhage. There is no evidence of a cortical-based area of acute infarction. There is generalized cerebral atrophy. There is periventricular white matter low attenuation likely secondary to microangiopathy. The ventricles and sulci are appropriate for the patient's age. The basal  cisterns are patent. Visualized portions of the orbits are unremarkable. The visualized portions of the paranasal sinuses and mastoid air cells are unremarkable. Cerebrovascular atherosclerotic calcifications are noted. The osseous structures are unremarkable. FINDINGS: 1. No acute intracranial pathology. 2. Chronic microvascular disease and cerebral atrophy. Electronically Signed   By: Kathreen Devoid   On: 02/14/2015 18:19   Ct Chest Wo Contrast  02/16/2015  CLINICAL DATA:  CHF exacerbation.  Kidney transplant patient. EXAM: CT CHEST WITHOUT CONTRAST TECHNIQUE: Multidetector CT imaging of the chest was performed following the standard protocol without IV  contrast. COMPARISON:  08/27/2013 FINDINGS: Cardiac enlargement. Normal caliber thoracic aorta. Prominent Coronary artery and aortic calcifications. Esophagus is decompressed. No significant lymphadenopathy in the chest. Small right pleural effusion. Mild atelectasis in the lung bases. Perihilar airspace changes bilaterally and nodular ground-glass opacities on the right. These changes likely are due to edema or may be infectious or inflammatory. Suggest follow-up in 3 months to confirm resolution.Focal area of atelectasis or scarring in the left mid lung, unchanged since previous study. No pneumothorax. Included portions of the upper abdominal organs demonstrate extensive vascular calcifications in the abdominal aorta and major branch vessels. Bilateral renal atrophy. Surgical absence of the gallbladder. Degenerative changes in the spine. IMPRESSION: Small right pleural effusion. Patchy perihilar infiltrates and nodular infiltrates in the lungs. Changes likely represent infectious or inflammatory changes versus edema. Suggest follow-up in 3 months. Extensive vascular calcifications. Electronically Signed   By: Lucienne Capers M.D.   On: 02/16/2015 03:23   . aspirin EC  81 mg Oral QPM  . atorvastatin  40 mg Oral q1800  . bimatoprost  1 drop Both Eyes QHS  . calcitRIOL  0.25 mcg Oral QODAY  . calcitRIOL  0.5 mcg Oral QODAY  . carvedilol  6.25 mg Oral BID WC  . famotidine  10 mg Oral Daily  . furosemide  40 mg Intravenous Daily  . methocarbamol  500 mg Oral BID  . mycophenolate  180 mg Oral BID  . predniSONE  5 mg Oral Q breakfast  . sodium bicarbonate  1,300 mg Oral BID  . sodium chloride  3 mL Intravenous Q12H  . tacrolimus  2 mg Oral BID  . timolol  1 drop Both Eyes BID  . Warfarin - Pharmacist Dosing Inpatient   Does not apply q1800    BMET    Component Value Date/Time   NA 134* 02/16/2015 0524   K 4.0 02/16/2015 0524   CL 104 02/16/2015 0524   CO2 19* 02/16/2015 0524   GLUCOSE 123*  02/16/2015 0524   BUN 49* 02/16/2015 0524   CREATININE 3.64* 02/16/2015 0524   CALCIUM 9.3 02/16/2015 0524   GFRNONAA 12* 02/16/2015 0524   GFRAA 14* 02/16/2015 0524   CBC    Component Value Date/Time   WBC 7.6 02/16/2015 0524   RBC 3.18* 02/16/2015 0524   RBC 3.17* 02/15/2015 0527   HGB 8.7* 02/16/2015 0524   HCT 26.8* 02/16/2015 0524   PLT 177 02/16/2015 0524   MCV 84.3 02/16/2015 0524   MCH 27.4 02/16/2015 0524   MCHC 32.5 02/16/2015 0524   RDW 16.7* 02/16/2015 0524   LYMPHSABS 1.3 02/14/2015 1837   MONOABS 0.3 02/14/2015 1837   EOSABS 0.0 02/14/2015 1837   BASOSABS 0.0 02/14/2015 1837     Assessment/Plan: 1. AKI/CKD-  Pt with underlying chronic allograft dysfunction now with acute CHF requiring IV diuresis and rising troponin, suspicious for ischemia.  Continue to follow for now and will need  to discuss risks and benefits of cardiac cath with patient and family.  Continue IV furosemide 40mg  daily for now 2. SSCP with rising troponin- up to 13.  Cardiology appropriately concerned about rising Scr.  Will cont to follow and discuss risks/benefits of cardiac cath, however I did tell the family that she may need to risk CIN if she develops unstable angina or EKG changes.  Appreciate cardiology's thoughtful care. 1. Pt does not want any intervention done at this time and would like to wait 3. Hyperkalemia due to #1. improved after kayexalate.  Continue to follow. 4. SOB related to pulmonary edema/CHF- treated with lasix. Continue to follow. Oxygen saturations improved with diuresis and BiPap 5. Respiratory distress- as above 6. Anemia of chronic kidney disease- will start ESA, and IV iron (although ferritin is markedly elevated) 7. HTN- stable 8. S/p DDKT with chronic allograft dysfunction- continue with immunosuppressive agents and follow renal function. 9. Vascular access- RUE AVG access +T/B 10. CAD with intermittent CP- Cardiology following. 11. SLE with anticardiolipin Ab  and h/o DVT/PE- therapeutic (supra) on coumadin. So doubtful that chest pain was PE. 12. Protein malnutrition 13. RLE fxs- non-weight-bearing cast in place  [employer]

## 2015-02-16 NOTE — Progress Notes (Signed)
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 Jacques Navy appointment 02/16/15) presented to [facility] on 02/14/15 with complaints of dizziness and nausea. She then developed chest pain and dyspnea while in the ED. Subsequently developed worsening dyspnea and was transferred to stepdown unit for BiPAP. Follow-up EKG revealed new LBBB. Started on NTG drip. Cardiology and nephrology consulted.   Assessment/Plan:   1. Acute on stage III chronic kidney disease inpatient with history of renal transplant 2010 and some allograft nephropathy: Nephrology consultation appreciated. Baseline creatinine 2.2-2.7. Admitted with creatinine of 3.39. Briefly hydrated with IV fluids which had to be discontinued secondary to CHF. Creatinine has gone up to 3.64. 2. Hyperkalemia: Secondary to acute kidney injury. Status post Kayexalate. Resolved. 3. NSTEMI/new cardiomyopathy: Cardiology follow-up appreciated. INR supratherapeutic on Coumadin hence no heparin. Continue beta blockers, statins and low-dose aspirin. Troponin peaked: 13.48. 2-D echo: LVEF 40-45 percent with wall motion abnormality in distal anteroseptal region. Cardiology have discussed extensively with patient/family and weighing options of aggressive evaluation i.e. cardiac cath which will push her into HD versus  conservative management. We'll follow. 4. Acute systolic CHF: Continue IV Lasix 40 mg daily. Improved. 5. Paroxysmal A. fib: Currently in sinus rhythm. Continue beta blockers and Coumadin per pharmacy. INR 3.76. 6. Acute respiratory failure with hypoxia: Secondary to decompensated CHF. Improved. Wean off of oxygen as tolerated to keep saturations greater than 92%. 7. Anemia of chronic kidney disease: Hemoglobin stable in the 8 g range. 8. Essential hypertension: Mildly uncontrolled. 9. Status post renal transplant with chronic allograft nephropathy: Continue immunosuppressants. 10. SLE/positive anticardiolipin antibodies/history of DVT/PE: On chronic Coumadin at home. INR supratherapeutic. Coumadin per pharmacy. CT chest without contrast done 1/24. 11. Right fibular and medial malleolus fracture: Continue cast and nonweightbearing status pending outpatient follow-up with Dr. Jean Rosenthal. 12. Bacteriuria: Urine culture shows >100 K diphtheroids. Will discuss with patient regarding presence of symptoms prior to initiating any form of treatment.    DVT prophylaxis: INR supratherapeutic on Coumadin Code Status: Full Family Communication: Discussed with patient's son at bedside on 1/25. Disposition Plan: DC home when medically stable. Continue management in stepdown unit for additional 24 hours.   Consultants:  Cardiology  Nephrology  Procedures:  Foley catheter  Antimicrobials:  None   Subjective: Patient states that she feels much better. Indicates that her chest pain resolved sometime last night when she went to sleep. No recurrence of chest pain since. Dyspnea improved.  Objective: Filed Vitals:   02/16/15 0600 02/16/15 0700 02/16/15 0800 02/16/15 0900  BP: 128/61 153/65 168/83 139/73  Pulse: 87 88 93 89  Temp:   99.7 F (37.6 C)   TempSrc:   Axillary   Resp: 19 24 19 20   Height:      Weight:      SpO2: 93% 100% 100% 100%    Intake/Output Summary (Last 24  hours) at 02/16/15 1110 Last data filed at 02/16/15 0900  Gross per 24 hour  Intake    840 ml  Output    950 ml  Net   -110 ml   Filed Weights   02/15/15 0203  Weight: 65.8 kg (145 lb 1 oz)    Exam:  General exam: Pleasant middle-aged female lying comfortably propped up in bed. Respiratory system: Occasional basal crackles but otherwise clear to auscultation. No increased work of breathing. Cardiovascular system: S1 & S2 heard, RRR. No JVD, murmurs, gallops, clicks or pedal edema. Telemetry: Sinus rhythm. Gastrointestinal system: Abdomen is nondistended, soft and nontender. Normal bowel sounds heard. Foley catheter +. Central nervous system: Alert and oriented. No focal neurological deficits. Extremities: Symmetric 5 x 5 power. RLE in cast below knee. Left upper arm with functioning AV fistula with thrill.   Data Reviewed: Basic Metabolic Panel:  Recent Labs Lab 02/14/15 1837 02/15/15 0117 02/15/15 0527 02/16/15 0524  NA 133* 132* 134* 134*  K 5.3* 5.3* 5.5* 4.0  CL 102 102 105 104  CO2 19* 18* 19* 19*  GLUCOSE 105* 108* 97 123*  BUN 46* 46* 45* 49*  CREATININE 3.39* 3.31* 3.41* 3.64*  CALCIUM 10.0 9.6 9.6 9.3  MG  --   --  1.8  --   PHOS  --   --  3.1  --    Liver Function Tests:  Recent Labs Lab 02/14/15 1837 02/15/15 0527  AST 18 18  ALT 10* 9*  ALKPHOS 95 86  BILITOT 0.8 0.9  PROT 6.0* 5.8*  ALBUMIN 2.9* 2.8*   No results for input(s): LIPASE, AMYLASE in the last 168 hours. No results for input(s): AMMONIA in the last 168 hours. CBC:  Recent Labs Lab 02/14/15 1837 02/15/15 0527 02/16/15 0524  WBC 6.3 6.3 7.6  NEUTROABS 4.7  --   --   HGB 8.6* 8.5* 8.7*  HCT 27.4* 26.3* 26.8*  MCV 82.8 83.0 84.3  PLT 199 198 177   Cardiac Enzymes:  Recent Labs Lab 02/15/15 0117 02/15/15 0527 02/15/15 1827 02/16/15 0524  TROPONINI <0.03 <0.03 13.13* 13.48*   BNP (last 3 results) No results for input(s): PROBNP in the last 8760 hours. CBG:  Recent  Labs Lab 02/15/15 0201  GLUCAP 97    Recent Results (from the past 240 hour(s))  MRSA PCR Screening     Status: None   Collection Time: 02/15/15 10:46 AM  Result Value Ref Range Status   MRSA by PCR NEGATIVE NEGATIVE Final    Comment:        The GeneXpert MRSA Assay (FDA approved for NASAL specimens only), is one component of a comprehensive MRSA colonization surveillance program. It is not intended to diagnose MRSA infection nor to guide or monitor treatment for MRSA infections.   Culture, Urine     Status: None   Collection Time: 02/15/15  1:52 PM  Result Value Ref Range Status   Specimen Description URINE, CATHETERIZED  Final   Special Requests Normal  Final   Culture   Final    >=100,000 COLONIES/mL DIPHTHEROIDS(CORYNEBACTERIUM SPECIES) Standardized susceptibility testing for this organism is not available.    Report Status 02/16/2015 FINAL  Final         Studies: Dg Chest 1 View  02/15/2015  CLINICAL DATA:  65 year old personal history of kidney transplant, currently with chest pain and acute onset of severe shortness of breath earlier today. EXAM: Portable CHEST 1 VIEW COMPARISON:  04/26/2014 and earlier. FINDINGS: Cardiac silhouette mildly to moderately enlarged, unchanged. Moderate diffuse interstitial and airspace pulmonary edema. Small bilateral pleural effusions. IMPRESSION: CHF, with stable cardiomegaly and  moderate diffuse interstitial and airspace pulmonary edema associated with small bilateral effusions. Electronically Signed   By: Evangeline Dakin M.D.   On: 02/15/2015 12:45   Ct Head Wo Contrast  02/14/2015  CLINICAL DATA:  Dizziness and nausea this morning. Similar of stones previously. EXAM: CT HEAD WITHOUT CONTRAST TECHNIQUE: Contiguous axial images were obtained from the base of the skull through the vertex without intravenous contrast. COMPARISON:  There is no evidence of mass effect, midline shift, or extra-axial fluid collections. There is no  evidence of a space-occupying lesion or intracranial hemorrhage. There is no evidence of a cortical-based area of acute infarction. There is generalized cerebral atrophy. There is periventricular white matter low attenuation likely secondary to microangiopathy. The ventricles and sulci are appropriate for the patient's age. The basal cisterns are patent. Visualized portions of the orbits are unremarkable. The visualized portions of the paranasal sinuses and mastoid air cells are unremarkable. Cerebrovascular atherosclerotic calcifications are noted. The osseous structures are unremarkable. FINDINGS: 1. No acute intracranial pathology. 2. Chronic microvascular disease and cerebral atrophy. Electronically Signed   By: Kathreen Devoid   On: 02/14/2015 18:19   Ct Chest Wo Contrast  02/16/2015  CLINICAL DATA:  CHF exacerbation.  Kidney transplant patient. EXAM: CT CHEST WITHOUT CONTRAST TECHNIQUE: Multidetector CT imaging of the chest was performed following the standard protocol without IV contrast. COMPARISON:  08/27/2013 FINDINGS: Cardiac enlargement. Normal caliber thoracic aorta. Prominent Coronary artery and aortic calcifications. Esophagus is decompressed. No significant lymphadenopathy in the chest. Small right pleural effusion. Mild atelectasis in the lung bases. Perihilar airspace changes bilaterally and nodular ground-glass opacities on the right. These changes likely are due to edema or may be infectious or inflammatory. Suggest follow-up in 3 months to confirm resolution.Focal area of atelectasis or scarring in the left mid lung, unchanged since previous study. No pneumothorax. Included portions of the upper abdominal organs demonstrate extensive vascular calcifications in the abdominal aorta and major branch vessels. Bilateral renal atrophy. Surgical absence of the gallbladder. Degenerative changes in the spine. IMPRESSION: Small right pleural effusion. Patchy perihilar infiltrates and nodular infiltrates  in the lungs. Changes likely represent infectious or inflammatory changes versus edema. Suggest follow-up in 3 months. Extensive vascular calcifications. Electronically Signed   By: Lucienne Capers M.D.   On: 02/16/2015 03:23        Scheduled Meds: . aspirin EC  81 mg Oral QPM  . atorvastatin  40 mg Oral q1800  . bimatoprost  1 drop Both Eyes QHS  . calcitRIOL  0.25 mcg Oral QODAY  . calcitRIOL  0.5 mcg Oral QODAY  . carvedilol  6.25 mg Oral BID WC  . darbepoetin (ARANESP) injection - NON-DIALYSIS  60 mcg Subcutaneous Q Wed-1800  . famotidine  10 mg Oral Daily  . furosemide  40 mg Intravenous Daily  . methocarbamol  500 mg Oral BID  . mycophenolate  180 mg Oral BID  . predniSONE  5 mg Oral Q breakfast  . sodium bicarbonate  1,300 mg Oral BID  . sodium chloride  3 mL Intravenous Q12H  . tacrolimus  2 mg Oral BID  . timolol  1 drop Both Eyes BID  . Warfarin - Pharmacist Dosing Inpatient   Does not apply q1800   Continuous Infusions: . sodium chloride 10 mL/hr (02/16/15 0700)  . nitroGLYCERIN 10 mcg/min (02/16/15 0700)    Active Problems:   Kidney transplant as cause of abnormal reaction or later complication   PAF (paroxysmal atrial fibrillation) ([place])  HTN (hypertension)   Chronic anticoagulation - with Coumadin   AKI (acute kidney injury) ([place])   Acute renal failure superimposed on stage 3 chronic kidney disease (HCC)   Dehydration   Chest pain   Acute on chronic renal failure (HCC)   Acute systolic heart failure ([place])    Time spent: 40 minutes.    Vernell Leep, MD, FACP, FHM. [facility] Pager [phone]  If 7PM-7AM, please contact night-coverage [email or web address] Password [email or web address] 02/16/2015, 11:10 AM    LOS: 2 days

## 2015-02-17 ENCOUNTER — Inpatient Hospital Stay (HOSPITAL_COMMUNITY): Payer: Medicare Other

## 2015-02-17 DIAGNOSIS — I214 Non-ST elevation (NSTEMI) myocardial infarction: Secondary | ICD-10-CM

## 2015-02-17 DIAGNOSIS — I209 Angina pectoris, unspecified: Secondary | ICD-10-CM

## 2015-02-17 LAB — RENAL FUNCTION PANEL
ALBUMIN: 2.4 g/dL — AB (ref 3.5–5.0)
Anion gap: 11 (ref 5–15)
BUN: 50 mg/dL — AB (ref 6–20)
CO2: 21 mmol/L — ABNORMAL LOW (ref 22–32)
CREATININE: 3.34 mg/dL — AB (ref 0.44–1.00)
Calcium: 9.1 mg/dL (ref 8.9–10.3)
Chloride: 103 mmol/L (ref 101–111)
GFR calc Af Amer: 16 mL/min — ABNORMAL LOW (ref >60)
GFR, EST NON AFRICAN AMERICAN: 13 mL/min — AB (ref >60)
GLUCOSE: 111 mg/dL — AB (ref 65–99)
PHOSPHORUS: 3.5 mg/dL (ref 2.5–4.6)
POTASSIUM: 3.8 mmol/L (ref 3.5–5.1)
SODIUM: 135 mmol/L (ref 135–145)

## 2015-02-17 LAB — CBC
HCT: 24.4 % — ABNORMAL LOW (ref 36.0–46.0)
HEMATOCRIT: 24.7 % — AB (ref 36.0–46.0)
Hemoglobin: 7.8 g/dL — ABNORMAL LOW (ref 12.0–15.0)
Hemoglobin: 8 g/dL — ABNORMAL LOW (ref 12.0–15.0)
MCH: 26.4 pg (ref 26.0–34.0)
MCH: 26.9 pg (ref 26.0–34.0)
MCHC: 32 g/dL (ref 30.0–36.0)
MCHC: 32.4 g/dL (ref 30.0–36.0)
MCV: 82.7 fL (ref 78.0–100.0)
MCV: 83.2 fL (ref 78.0–100.0)
PLATELETS: 188 10*3/uL (ref 150–400)
PLATELETS: 185 10*3/uL (ref 150–400)
RBC: 2.95 MIL/uL — ABNORMAL LOW (ref 3.87–5.11)
RBC: 2.97 MIL/uL — ABNORMAL LOW (ref 3.87–5.11)
RDW: 16.4 % — AB (ref 11.5–15.5)
RDW: 16.4 % — AB (ref 11.5–15.5)
WBC: 6.7 10*3/uL (ref 4.0–10.5)
WBC: 6.7 10*3/uL (ref 4.0–10.5)

## 2015-02-17 LAB — PROTIME-INR
INR: 2.84 — AB (ref 0.00–1.49)
PROTHROMBIN TIME: 29.3 s — AB (ref 11.6–15.2)

## 2015-02-17 MED ORDER — SODIUM CHLORIDE 0.9 % IV SOLN
125.0000 mg | Freq: Once | INTRAVENOUS | Status: AC
  Administered 2015-02-17: 125 mg via INTRAVENOUS
  Filled 2015-02-17: qty 10

## 2015-02-17 NOTE — Progress Notes (Signed)
Orthopedic Tech Progress Note Patient Details:  Debbie [name] [birth year] [id number]  Casting Type of Cast: Short leg cast Cast Location: rle Cast Intervention: Removal     Hildred Priest 02/17/2015, 2:02 PM

## 2015-02-17 NOTE — Progress Notes (Addendum)
Cardiologist: Dr. Radford Pax  Subjective:   [id number] Debbie [name] is a 65 y.o. year old female with a history of chest pain, last MV 06/2013 w/ some ischemia (results below), but medical therapy recommended due to poor renal function. Also with history of HTN, HLD, PAF on coumadin, SLE, positive anticardiolipin antibody, ESRD s/p renal transplant 2010, DVT. Severe pulmonary HTN w/ PCWP 24 mmHg at R heart cath 08/2013. Mechanical fall 01/23/2015 w/ RLE oblique fracture through the distal fibular diaphysis and oblique medial malleolar fracture, in a non-weight-bearing cast.  On 02/15/15 to develop respiratory distress requiring BiPAP, mild dose of Ativan. Troponin elevation consistent with non-ST elevation myocardial infarction, wide QRS, EF moderately reduced on echocardiogram.  Resting comfortably this morning. No complaints of CP or SOB.   Objective:  Vital Signs in the last 24 hours: Temp:  [97.6 F (36.4 C)-100 F (37.8 C)] 98.1 F (36.7 C) (01/26 0749) Pulse Rate:  [70-90] 74 (01/26 0826) Resp:  [14-33] 22 (01/26 0800) BP: (109-166)/(56-75) 126/62 mmHg (01/26 0826) SpO2:  [92 %-100 %] 100 % (01/26 0800)  Intake/Output from previous day: 01/25 0701 - 01/26 0700 In: 1116 [P.O.:800; I.V.:316] Out: 645 [Urine:645]   Physical Exam: General: Well developed, well nourished, in no acute distress, somewhat frail. Head:  Normocephalic and atraumatic. Lungs: No significant crackles or wheezes heard on exam. Heart: Normal S1 and S2.  2/6 S murmur, no rubs or gallops.  Abdomen: soft, non-tender, positive bowel sounds. Extremities: No clubbing or cyanosis. No significant edema. Neurologic: Alert and oriented x 3.    Lab Results:  Recent Labs  02/16/15 0524 02/17/15 0326  WBC 7.6 6.7  HGB 8.7* 7.8*  PLT 177 188    Recent Labs  02/16/15 0524 02/17/15 0326  NA 134* 135  K 4.0 3.8  CL 104 103  CO2 19* 21*  GLUCOSE 123* 111*  BUN 49* 50*  CREATININE 3.64* 3.34*    Recent  Labs  02/16/15 1158 02/16/15 1836  TROPONINI 9.78* 7.34*   Hepatic Function Panel  Recent Labs  02/15/15 0527 02/17/15 0326  PROT 5.8*  --   ALBUMIN 2.8* 2.4*  AST 18  --   ALT 9*  --   ALKPHOS 86  --   BILITOT 0.9  --    No results for input(s): CHOL in the last 72 hours. No results for input(s): PROTIME in the last 72 hours.  Imaging: Dg Chest 1 View  02/15/2015  CLINICAL DATA:  65 year old personal history of kidney transplant, currently with chest pain and acute onset of severe shortness of breath earlier today. EXAM: Portable CHEST 1 VIEW COMPARISON:  04/26/2014 and earlier. FINDINGS: Cardiac silhouette mildly to moderately enlarged, unchanged. Moderate diffuse interstitial and airspace pulmonary edema. Small bilateral pleural effusions. IMPRESSION: CHF, with stable cardiomegaly and moderate diffuse interstitial and airspace pulmonary edema associated with small bilateral effusions. Electronically Signed   By: Evangeline Dakin M.D.   On: 02/15/2015 12:45   Ct Chest Wo Contrast  02/16/2015  CLINICAL DATA:  CHF exacerbation.  Kidney transplant patient. EXAM: CT CHEST WITHOUT CONTRAST TECHNIQUE: Multidetector CT imaging of the chest was performed following the standard protocol without IV contrast. COMPARISON:  08/27/2013 FINDINGS: Cardiac enlargement. Normal caliber thoracic aorta. Prominent Coronary artery and aortic calcifications. Esophagus is decompressed. No significant lymphadenopathy in the chest. Small right pleural effusion. Mild atelectasis in the lung bases. Perihilar airspace changes bilaterally and nodular ground-glass opacities on the right. These changes likely are due to edema or may  be infectious or inflammatory. Suggest follow-up in 3 months to confirm resolution.Focal area of atelectasis or scarring in the left mid lung, unchanged since previous study. No pneumothorax. Included portions of the upper abdominal organs demonstrate extensive vascular calcifications in the  abdominal aorta and major branch vessels. Bilateral renal atrophy. Surgical absence of the gallbladder. Degenerative changes in the spine. IMPRESSION: Small right pleural effusion. Patchy perihilar infiltrates and nodular infiltrates in the lungs. Changes likely represent infectious or inflammatory changes versus edema. Suggest follow-up in 3 months. Extensive vascular calcifications. Electronically Signed   By: Lucienne Capers M.D.   On: 02/16/2015 03:23   Personally viewed.   Telemetry: No adverse arrhythmias, no VT Personally viewed.   EKG:  EKG this morning shows narrow complex QRS with nonspecific ST-T wave changes. Yesterday she had a widened QRS complex. Personally viewed.  Cardiac Studies:  Echocardiogram 02/15/15: - Normal LV size with mild LV hypertrophy. EF 40-45% with diffuse hypokinesis, septal-lateral dyssynchrony. Normal RV size and systolic function. Calcified mitral valve and annulus, mild to moderate MR. Elevated mean gradient across the mitral valve probably due to high flow (does not appear to have significant stenosis). Aortic sclerosis without significant stenosis. Mild aortic insufficiency. Moderate pulmonary hypertension.  Meds: Scheduled Meds: . aspirin EC  81 mg Oral QPM  . atorvastatin  40 mg Oral q1800  . bimatoprost  1 drop Both Eyes QHS  . calcitRIOL  0.25 mcg Oral QODAY  . calcitRIOL  0.5 mcg Oral QODAY  . carvedilol  6.25 mg Oral BID WC  . darbepoetin (ARANESP) injection - NON-DIALYSIS  60 mcg Subcutaneous Q Wed-1800  . famotidine  10 mg Oral Daily  . furosemide  40 mg Intravenous Daily  . methocarbamol  500 mg Oral BID  . mycophenolate  180 mg Oral BID  . predniSONE  5 mg Oral Q breakfast  . sodium bicarbonate  1,300 mg Oral BID  . sodium chloride  3 mL Intravenous Q12H  . tacrolimus  2 mg Oral BID  . timolol  1 drop Both Eyes BID   Continuous Infusions: . sodium chloride 10 mL/hr at 02/17/15 0800  . nitroGLYCERIN 10 mcg/min (02/17/15  0800)   PRN Meds:.sodium chloride, acetaminophen **OR** acetaminophen, LORazepam, morphine injection, ondansetron **OR** [DISCONTINUED] ondansetron (ZOFRAN) IV, traMADol  Assessment/Plan:  Active Problems:   Kidney transplant as cause of abnormal reaction or later complication   PAF (paroxysmal atrial fibrillation) (HCC)   HTN (hypertension)   Chronic anticoagulation - with Coumadin   AKI (acute kidney injury) ([place])   Acute renal failure superimposed on stage 3 chronic kidney disease (HCC)   Dehydration   Chest pain   Acute on chronic renal failure (HCC)   Acute systolic heart failure (HCC)   Hyperkalemia   NSTEMI (non-ST elevated myocardial infarction) (HCC)   Anemia in CKD (chronic kidney disease)  Non-ST elevation myocardial infarction  - She is supratherapeutic on Coumadin. No heparin  - Continue with beta blocker  - Continue with statin  - Continue with low-dose aspirin  - Troponin peak, 13 trending down.   - Ejection fraction 40-45% with what appears to be a wall motion abnormality in the distal anteroseptal region.  - Her QRS complex was widened in the setting of her respiratory distress yesterday but now QRS is now narrowed.  - We had lengthy discussion yesterday with her and her family and friends about the risks and benefits of potential cardiac catheterization, utilization of IV contrast and its potential implications for worsening  acute kidney injury, hemodialysis. This is a very challenging situation. Regardless, we would need to hold her Coumadin if we decided to pursue cardiac catheterization given that she is supra therapeutic. At this point, given the marked improvement in her symptoms, resolution of respiratory distress, I am weighing on the side of continued aggressive medical management. If her creatinine returns back to baseline as it was 5-6 months ago, we could consider cardiac catheterization at that time. Once again, we would have to hold her Coumadin. Another  option is if she begins to develop worsening anginal symptoms, more shortness of breath, our hand may be forced in a sense to pursue diagnostic angiogram. Continue to collaborate with nephrology on this matter. She does not wish to proceed with cath at this time.   - Stop NTG drip  Chronic anticoagulation  - Has history of atrial fibrillation as well as remote DVT  - Currently supratherapeutic.  - Appreciate pharmacy team assistance  Status post renal transplant  - Nephrology  - Medications reviewed  Acute systolic heart failure  - EF 40-45%  - Marked improvement since yesterday.  - Gentle IV Lasix 40 mg daily. Appreciate nephrology's assistance.  Paroxysmal atrial fibrillation  - Current sinus rhythm  Anemia  - Hg drop  - may wish to repeat (per primary TRH team)  [place], [place] 02/17/2015, 9:58 AM

## 2015-02-17 NOTE — Progress Notes (Signed)
Initial Nutrition Assessment  DOCUMENTATION CODES:   Not applicable  INTERVENTION:   -Snacks TID  NUTRITION DIAGNOSIS:   Inadequate oral intake related to poor appetite as evidenced by meal completion < 50%.  GOAL:   Patient will meet greater than or equal to 90% of their needs  MONITOR:   PO intake, Labs, Weight trends, Skin, I & O's  REASON FOR ASSESSMENT:   Consult Assessment of nutrition requirement/status  ASSESSMENT:   65 yo F with hx of renal transplant here with "dizziness" acute on chronic renal failure, chest pain case has been discussed with nephrology who recommends rehydration and following up creatinine  Pt admitted with possible unstable angina/ACS.   Spoke with pt at bedside, who was initially very sleepy at time of visit, but became more conversant as time went on. She reports poor appetite PTA. She was consuming 3 meals per day (but only eating about 50%- favorite foods include chicken and rice, beans, vegetable plates, and sandwiches) and snacks in between meals (peanut butter crackers). She reports he appetite is slowly improving and ate all of her eggs, french toast, and syrup at breakfast. Meal completion per doc flowsheets 25-50%.   Pt reports UBW is around 160#. She endorses a 13# weight loss, however, is unable to provide time frame for weight loss. Reviewed wt hx, which reveals wt stability over the past 6 months. Noted a 9.9% wt loss over the past 9 months, per wt hx.   Pt questioned why she was offered potatoes for meals, as she typically follows a low potassium diet. Discussed other food options that were available to her that are lower ion potassium and rationale for heart heart healthy diet. Also discussed importance of good nutritional intake to promote healing. Pt politely declined addition of supplements but amenable to snacks.   Nutrition-Focused physical exam completed. Findings are no fat depletion, no muscle depletion, and no edema.    Labs reviewed.   Diet Order:  Diet Heart Room service appropriate?: Yes; Fluid consistency:: Thin  Skin:  Reviewed, no issues  Last BM:  02/16/15  Height:   Ht Readings from Last 1 Encounters:  02/15/15 5\' 1"  (1.549 m)    Weight:   Wt Readings from Last 1 Encounters:  02/15/15 145 lb 1 oz (65.8 kg)    Ideal Body Weight:  47.7 kg  BMI:  Body mass index is 27.42 kg/(m^2).  Estimated Nutritional Needs:   Kcal:  1650-1850  Protein:  75-90 grams  Fluid:  1.6-1.8 L  EDUCATION NEEDS:   Education needs addressed  Allura Doepke A. Jimmye Norman, RD, LDN, CDE Pager: [phone] After hours Pager: [phone]

## 2015-02-17 NOTE — Progress Notes (Signed)
Patient ID: Debbie [name], female   DOB: January 25, 1950, 65 y.o.   MRN: [id number] S:feels better today O:BP 152/60 mmHg  Pulse 79  Temp(Src) 98.1 F (36.7 C) (Oral)  Resp 24  Ht 5\' 1"  (1.549 m)  Wt 65.8 kg (145 lb 1 oz)  BMI 27.42 kg/m2  SpO2 100%  Intake/Output Summary (Last 24 hours) at 02/17/15 0751 Last data filed at 02/17/15 0630  Gross per 24 hour  Intake   1116 ml  Output    645 ml  Net    471 ml   Intake/Output: I/O last 3 completed shifts: In: [id number] [P.O.:1040; I.V.:329] Out: 1045 [Urine:1045]  Intake/Output this shift:    Weight change:  Gen:WD WN AAF in NAD CVS:no rub Resp:bibasilar crackles [id number] Ext:no edema, RUE AVG +T/B   Recent Labs Lab 02/14/15 1837 02/15/15 0117 02/15/15 0527 02/16/15 0524 02/17/15 0326  NA 133* 132* 134* 134* 135  K 5.3* 5.3* 5.5* 4.0 3.8  CL 102 102 105 104 103  CO2 19* 18* 19* 19* 21*  GLUCOSE 105* 108* 97 123* 111*  BUN 46* 46* 45* 49* 50*  CREATININE 3.39* 3.31* 3.41* 3.64* 3.34*  ALBUMIN 2.9*  --  2.8*  --  2.4*  CALCIUM 10.0 9.6 9.6 9.3 9.1  PHOS  --   --  3.1  --  3.5  AST 18  --  18  --   --   ALT 10*  --  9*  --   --    Liver Function Tests:  Recent Labs Lab 02/14/15 1837 02/15/15 0527 02/17/15 0326  AST 18 18  --   ALT 10* 9*  --   ALKPHOS 95 86  --   BILITOT 0.8 0.9  --   PROT 6.0* 5.8*  --   ALBUMIN 2.9* 2.8* 2.4*   No results for input(s): LIPASE, AMYLASE in the last 168 hours. No results for input(s): AMMONIA in the last 168 hours. CBC:  Recent Labs Lab 02/14/15 1837 02/15/15 0527 02/16/15 0524 02/17/15 0326  WBC 6.3 6.3 7.6 6.7  NEUTROABS 4.7  --   --   --   HGB 8.6* 8.5* 8.7* 7.8*  HCT 27.4* 26.3* 26.8* 24.4*  MCV 82.8 83.0 84.3 82.7  PLT 199 198 177 188   Cardiac Enzymes:  Recent Labs Lab 02/15/15 0527 02/15/15 1827 02/16/15 0524 02/16/15 1158 02/16/15 1836  TROPONINI <0.03 13.13* 13.48* 9.78* 7.34*   CBG:  Recent Labs Lab 02/15/15 0201  GLUCAP 97    Iron Studies:   Recent Labs  02/15/15 0527  IRON 12*  TIBC 169*  FERRITIN 1128*   Studies/Results: Dg Chest 1 View  02/15/2015  CLINICAL DATA:  64 year old personal history of kidney transplant, currently with chest pain and acute onset of severe shortness of breath earlier today. EXAM: Portable CHEST 1 VIEW COMPARISON:  04/26/2014 and earlier. FINDINGS: Cardiac silhouette mildly to moderately enlarged, unchanged. Moderate diffuse interstitial and airspace pulmonary edema. Small bilateral pleural effusions. IMPRESSION: CHF, with stable cardiomegaly and moderate diffuse interstitial and airspace pulmonary edema associated with small bilateral effusions. Electronically Signed   By: Evangeline Dakin M.D.   On: 02/15/2015 12:45   Ct Chest Wo Contrast  02/16/2015  CLINICAL DATA:  CHF exacerbation.  Kidney transplant patient. EXAM: CT CHEST WITHOUT CONTRAST TECHNIQUE: Multidetector CT imaging of the chest was performed following the standard protocol without IV contrast. COMPARISON:  08/27/2013 FINDINGS: Cardiac enlargement. Normal caliber thoracic aorta. Prominent Coronary artery and aortic calcifications. Esophagus is decompressed.  No significant lymphadenopathy in the chest. Small right pleural effusion. Mild atelectasis in the lung bases. Perihilar airspace changes bilaterally and nodular ground-glass opacities on the right. These changes likely are due to edema or may be infectious or inflammatory. Suggest follow-up in 3 months to confirm resolution.Focal area of atelectasis or scarring in the left mid lung, unchanged since previous study. No pneumothorax. Included portions of the upper abdominal organs demonstrate extensive vascular calcifications in the abdominal aorta and major branch vessels. Bilateral renal atrophy. Surgical absence of the gallbladder. Degenerative changes in the spine. IMPRESSION: Small right pleural effusion. Patchy perihilar infiltrates and nodular infiltrates in the lungs. Changes likely  represent infectious or inflammatory changes versus edema. Suggest follow-up in 3 months. Extensive vascular calcifications. Electronically Signed   By: Lucienne Capers M.D.   On: 02/16/2015 03:23   . aspirin EC  81 mg Oral QPM  . atorvastatin  40 mg Oral q1800  . bimatoprost  1 drop Both Eyes QHS  . calcitRIOL  0.25 mcg Oral QODAY  . calcitRIOL  0.5 mcg Oral QODAY  . carvedilol  6.25 mg Oral BID WC  . darbepoetin (ARANESP) injection - NON-DIALYSIS  60 mcg Subcutaneous Q Wed-1800  . famotidine  10 mg Oral Daily  . furosemide  40 mg Intravenous Daily  . methocarbamol  500 mg Oral BID  . mycophenolate  180 mg Oral BID  . predniSONE  5 mg Oral Q breakfast  . sodium bicarbonate  1,300 mg Oral BID  . sodium chloride  3 mL Intravenous Q12H  . tacrolimus  2 mg Oral BID  . timolol  1 drop Both Eyes BID    BMET    Component Value Date/Time   NA 135 02/17/2015 0326   K 3.8 02/17/2015 0326   CL 103 02/17/2015 0326   CO2 21* 02/17/2015 0326   GLUCOSE 111* 02/17/2015 0326   BUN 50* 02/17/2015 0326   CREATININE 3.34* 02/17/2015 0326   CALCIUM 9.1 02/17/2015 0326   GFRNONAA 13* 02/17/2015 0326   GFRAA 16* 02/17/2015 0326   CBC    Component Value Date/Time   WBC 6.7 02/17/2015 0326   RBC 2.95* 02/17/2015 0326   RBC 3.17* 02/15/2015 0527   HGB 7.8* 02/17/2015 0326   HCT 24.4* 02/17/2015 0326   PLT 188 02/17/2015 0326   MCV 82.7 02/17/2015 0326   MCH 26.4 02/17/2015 0326   MCHC 32.0 02/17/2015 0326   RDW 16.4* 02/17/2015 0326   LYMPHSABS 1.3 02/14/2015 1837   MONOABS 0.3 02/14/2015 1837   EOSABS 0.0 02/14/2015 1837   BASOSABS 0.0 02/14/2015 1837     Assessment/Plan: 1. AKI/CKD- Pt with underlying chronic allograft dysfunction now with acute CHF requiring IV diuresis and rising troponin, suspicious for ischemia. Continue to follow for now and will need to discuss risks and benefits of cardiac cath with patient and family.  1. Scr improved slightly over last 24  hours 2. Continue IV furosemide 40mg  daily for now 2. SSCP with rising troponin- up to 13. Cardiology appropriately concerned about rising Scr. Will cont to follow and discuss risks/benefits of cardiac cath, however I did tell the family that she may need to risk CIN if she develops unstable angina or EKG changes. Appreciate cardiology's thoughtful care. 1. Pt does not want any intervention done at this time and would like to wait 3. Hyperkalemia due to #1. improved after kayexalate. Continue to follow. 4. SOB related to pulmonary edema/CHF- treated with lasix and improved. Continue to follow.  Oxygen saturations improved with diuresis and off of BiPap 5. Respiratory distress- as above 6. Anemia of chronic kidney disease- will start ESA, and IV iron (although ferritin is markedly elevated) 1. hgb dropped to 7.8, consider transfusion of 1 unit PRBC's since she has elevated troponins and chest pain. 7. HTN- stable 8. S/p DDKT with chronic allograft dysfunction- continue with immunosuppressive agents and follow renal function. 9. Vascular access- RUE AVG access +T/B 10. CAD with intermittent CP- Cardiology following. 11. 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 So doubtful that chest pain was PE. 12. Severe Protein malnutrition- supplement per nutrition 13. RLE fxs- non-weight-bearing cast in place  [employer]

## 2015-02-17 NOTE — Progress Notes (Signed)
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 Jacques Navy appointment 02/16/15) presented to [facility] on [facility]/14/15 with complaints of dizziness and nausea. She then developed chest pain and dyspnea while in the ED. Subsequently developed worsening dyspnea and was transferred to stepdown unit for BiPAP. Follow-up EKG revealed new LBBB. Started on NTG drip. Cardiology and nephrology consulted.   Assessment/Plan:   1. Acute on stage III chronic kidney disease inpatient with history of renal transplant 2010 and some allograft nephropathy: Nephrology consultation appreciated. Baseline creatinine 2.2-2.7. Admitted with creatinine of 3.39. Briefly hydrated with IV fluids which had to be discontinued secondary to CHF. Creatinine has improved from 3.64 > 3.34. Nephrology input appreciated. 2. Hyperkalemia: Secondary to acute kidney injury. Status post Kayexalate. Resolved. 3. NSTEMI/new cardiomyopathy: Cardiology follow-up appreciated. INR supratherapeutic on Coumadin hence no heparin. Continue beta blockers, statins and low-dose aspirin. Troponin peaked: 13.48. 2-D echo: LVEF 40-45 percent with wall motion abnormality in distal anteroseptal region. Cardiology have discussed extensively with patient/family and are recommending continued aggressive medical management and  consider aggressive evaluation i.e. cardiac catheterization if her creatinine returns to baseline as it was 5-6 months ago or if she develops worsening anginal symptoms or dyspnea. Coumadin will have to be held prior to procedure. Patient at this time does not wish to proceed with. NTG stopped. 4. Acute systolic CHF: Continue IV Lasix 40 mg daily. Improved. 5. Paroxysmal A. fib: Currently in sinus rhythm. Continue beta blockers and Coumadin per pharmacy. INR 3.76. 6. Acute respiratory failure with hypoxia: Secondary to decompensated CHF. Improved. Wean off of oxygen as tolerated to keep saturations greater than 92%. 7. Anemia of chronic kidney disease: Hemoglobin was stable in the 8 g range until dropped to 7.8 this morning. Probably lab variation. We will repeat. 8. Essential hypertension: Mildly uncontrolled. 9. Status post renal transplant with chronic allograft nephropathy: Continue immunosuppressants. 10. SLE/positive anticardiolipin antibodies/history of DVT/PE: On chronic Coumadin at home. INR therapeutic at 2.84. Coumadin per pharmacy. CT chest without contrast done 1/24. 11. Right fibular and medial malleolus fracture: Continue cast and nonweightbearing status pending outpatient follow-up with Dr. Jean Rosenthal. Discussed with Dr. Cristie Hem current management and we'll see in patient. 12. Bacteriuria: Urine culture shows >100 K diphtheroids. Patient denies symptoms suggestive of UTI. Asymptomatic bacteriuria.   DVT prophylaxis: INR therapeutic on Coumadin Code Status: Full Family Communication: Discussed with patient's son at bedside on 1/25. None at bedside today. Disposition Plan: DC home when medically stable. Continue management in stepdown unit for additional 24 hours.   Consultants:  Cardiology  Nephrology  Procedures:  Foley catheter-DC'd 1/26  Antimicrobials:  None   Subjective: No dyspnea reported. Had brief/20 minutes episode of precordial chest pain  early this morning which resolved spontaneously.  Objective: Filed Vitals:   02/17/15 0700 02/17/15 0749 02/17/15 0800 02/17/15 0826  BP: 152/60  126/62 126/62  Pulse: 79  78 74  Temp:  98.1 F (36.7 C)    TempSrc:  Oral    Resp:  24  22   Height:      Weight:      SpO2: 100%  100%     Intake/Output Summary (Last 24 hours) at 02/17/15 1045 Last data filed at 02/17/15 0800  Gross per 24 hour  Intake    983 ml  Output    795 ml  Net    188 ml   Filed Weights   02/15/15 0203  Weight: 65.8 kg (145 lb 1 oz)    Exam:  General exam: Pleasant middle-aged female lying comfortably propped up in bed. Respiratory system: clear to auscultation. No increased work of breathing. Cardiovascular system: S1 & S2 heard, RRR. No JVD, murmurs, gallops, clicks or pedal edema. Telemetry: Sinus rhythm. Gastrointestinal system: Abdomen is nondistended, soft and nontender. Normal bowel sounds heard. Foley catheter +. Central nervous system: Alert and oriented. No focal neurological deficits. Extremities: Symmetric 5 x 5 power. RLE in cast below knee. Left upper arm with functioning AV fistula with thrill.   Data Reviewed: Basic Metabolic Panel:  Recent Labs Lab 02/14/15 1837 02/15/15 0117 02/15/15 0527 02/16/15 0524 02/17/15 0326  NA 133* 132* 134* 134* 135  K 5.3* 5.3* 5.5* 4.0 3.8  CL 102 102 105 104 103  CO2 19* 18* 19* 19* 21*  GLUCOSE 105* 108* 97 123* 111*  BUN 46* 46* 45* 49* 50*  CREATININE 3.39* 3.31* 3.41* 3.64* 3.34*  CALCIUM 10.0 9.6 9.6 9.3 9.1  MG  --   --  1.8  --   --   PHOS  --   --  3.1  --  3.5   Liver Function Tests:  Recent Labs Lab 02/14/15 1837 02/15/15 0527 02/17/15 0326  AST 18 18  --   ALT 10* 9*  --   ALKPHOS 95 86  --   BILITOT 0.8 0.9  --   PROT 6.0* 5.8*  --   ALBUMIN 2.9* 2.8* 2.4*   No results for input(s): LIPASE, AMYLASE in the last 168 hours. No results for input(s): AMMONIA in the last 168 hours. CBC:  Recent Labs Lab  02/14/15 1837 02/15/15 0527 02/16/15 0524 02/17/15 0326  WBC 6.3 6.3 7.6 6.7  NEUTROABS 4.7  --   --   --   HGB 8.6* 8.5* 8.7* 7.8*  HCT 27.4* 26.3* 26.8* 24.4*  MCV 82.8 83.0 84.3 82.7  PLT 199 198 177 188   Cardiac Enzymes:  Recent Labs Lab 02/15/15 0527 02/15/15 1827 02/16/15 0524 02/16/15 1158 02/16/15 1836  TROPONINI <0.03 13.13* 13.48* 9.78* 7.34*   BNP (last 3 results) No results for input(s): PROBNP in the last 8760 hours. CBG:  Recent Labs Lab 02/15/15 0201  GLUCAP 97    Recent Results (from the past 240 hour(s))  MRSA PCR Screening     Status: None   Collection Time: 02/15/15 10:46 AM  Result Value Ref Range Status   MRSA by PCR NEGATIVE NEGATIVE Final    Comment:        The GeneXpert MRSA Assay (FDA approved for NASAL specimens only), is one component of a comprehensive MRSA colonization surveillance program. It is not intended to diagnose MRSA infection nor to guide or monitor treatment for MRSA infections.   Culture, Urine     Status: None   Collection Time: 02/15/15  1:52 PM  Result Value Ref Range Status   Specimen Description URINE, CATHETERIZED  Final   Special Requests Normal  Final   Culture   Final    >=100,000 COLONIES/mL DIPHTHEROIDS(CORYNEBACTERIUM  SPECIES) Standardized susceptibility testing for this organism is not available.    Report Status 02/16/2015 FINAL  Final         Studies: Dg Chest 1 View  02/15/2015  CLINICAL DATA:  65 year old personal history of kidney transplant, currently with chest pain and acute onset of severe shortness of breath earlier today. EXAM: Portable CHEST 1 VIEW COMPARISON:  04/26/2014 and earlier. FINDINGS: Cardiac silhouette mildly to moderately enlarged, unchanged. Moderate diffuse interstitial and airspace pulmonary edema. Small bilateral pleural effusions. IMPRESSION: CHF, with stable cardiomegaly and moderate diffuse interstitial and airspace pulmonary edema associated with small bilateral  effusions. Electronically Signed   By: Evangeline Dakin M.D.   On: 02/15/2015 12:45   Ct Chest Wo Contrast  02/16/2015  CLINICAL DATA:  CHF exacerbation.  Kidney transplant patient. EXAM: CT CHEST WITHOUT CONTRAST TECHNIQUE: Multidetector CT imaging of the chest was performed following the standard protocol without IV contrast. COMPARISON:  08/27/2013 FINDINGS: Cardiac enlargement. Normal caliber thoracic aorta. Prominent Coronary artery and aortic calcifications. Esophagus is decompressed. No significant lymphadenopathy in the chest. Small right pleural effusion. Mild atelectasis in the lung bases. Perihilar airspace changes bilaterally and nodular ground-glass opacities on the right. These changes likely are due to edema or may be infectious or inflammatory. Suggest follow-up in 3 months to confirm resolution.Focal area of atelectasis or scarring in the left mid lung, unchanged since previous study. No pneumothorax. Included portions of the upper abdominal organs demonstrate extensive vascular calcifications in the abdominal aorta and major branch vessels. Bilateral renal atrophy. Surgical absence of the gallbladder. Degenerative changes in the spine. IMPRESSION: Small right pleural effusion. Patchy perihilar infiltrates and nodular infiltrates in the lungs. Changes likely represent infectious or inflammatory changes versus edema. Suggest follow-up in 3 months. Extensive vascular calcifications. Electronically Signed   By: Lucienne Capers M.D.   On: 02/16/2015 03:23        Scheduled Meds: . aspirin EC  81 mg Oral QPM  . atorvastatin  40 mg Oral q1800  . bimatoprost  1 drop Both Eyes QHS  . calcitRIOL  0.25 mcg Oral QODAY  . calcitRIOL  0.5 mcg Oral QODAY  . carvedilol  6.25 mg Oral BID WC  . darbepoetin (ARANESP) injection - NON-DIALYSIS  60 mcg Subcutaneous Q Wed-1800  . famotidine  10 mg Oral Daily  . furosemide  40 mg Intravenous Daily  . methocarbamol  500 mg Oral BID  . mycophenolate   180 mg Oral BID  . predniSONE  5 mg Oral Q breakfast  . sodium bicarbonate  1,300 mg Oral BID  . sodium chloride  3 mL Intravenous Q12H  . tacrolimus  2 mg Oral BID  . timolol  1 drop Both Eyes BID   Continuous Infusions: . sodium chloride 10 mL/hr at 02/17/15 0800    Active Problems:   Kidney transplant as cause of abnormal reaction or later complication   PAF (paroxysmal atrial fibrillation) (HCC)   HTN (hypertension)   Chronic anticoagulation - with Coumadin   AKI (acute kidney injury) ([place])   Acute renal failure superimposed on stage 3 chronic kidney disease (HCC)   Dehydration   Chest pain   Acute on chronic renal failure (HCC)   Acute systolic heart failure (HCC)   Hyperkalemia   NSTEMI (non-ST elevated myocardial infarction) (HCC)   Anemia in CKD (chronic kidney disease)    Time spent: 20 minutes.    Vernell Leep, MD, FACP, FHM. [facility] Pager [phone]  If 7PM-7AM, please contact  night-coverage [email or web address] Password [email or web address] 02/17/2015, 10:45 AM    LOS: 3 days

## 2015-02-17 NOTE — Progress Notes (Signed)
Patient ID: Debbie [name], female   DOB: 1950/12/25, 65 y.o.   MRN: [id number] I reviewed her ankle x-rays and her fracture alignment actually looks worse.  She needs to remain strictly non-weight bearing on her right ankle and may need surgery to better align her ankle for stability purposes.  Would, of course, need medical clearance for surgery and would need to certainly be stable.  Will follow.  Again, should not put any weight on her right ankle at all.

## 2015-02-17 NOTE — Progress Notes (Signed)
Orthopedic Tech Progress Note Patient Details:  Debbie [name] 02-10-1950 [id number]  Ortho Devices Type of Ortho Device: CAM walker Ortho Device/Splint Location: rle Ortho Device/Splint Interventions: Application   [name] 02/17/2015, 2:03 PM

## 2015-02-17 NOTE — Progress Notes (Signed)
Patient ID: Debbie [name], female   DOB: [birth year], 65 y.o.   MRN: [id number] Ms. Prasek has been under orthopedic care for a right bimalleolar ankle fracture that occurred 01/22/15.  She is currently in a short-leg cast.  I will have the Ortho Techs come by to remove her cast and place her in a cam walker/walking boot.  Will order new ankle x-rays.  She can be only up to 50% weight on her right ankle in the boot.

## 2015-02-17 NOTE — Care Management Important Message (Signed)
Important Message  Patient Details  Name: Debbie [name] MRN: [id number] Date of Birth: February 06, 1950   Medicare Important Message Given:  Yes    Chirstina Haan P Tymier Lindholm 02/17/2015, 2:24 PM

## 2015-02-18 ENCOUNTER — Inpatient Hospital Stay (HOSPITAL_COMMUNITY): Payer: Medicare Other

## 2015-02-18 DIAGNOSIS — D649 Anemia, unspecified: Secondary | ICD-10-CM

## 2015-02-18 DIAGNOSIS — N189 Chronic kidney disease, unspecified: Secondary | ICD-10-CM

## 2015-02-18 DIAGNOSIS — D631 Anemia in chronic kidney disease: Secondary | ICD-10-CM

## 2015-02-18 DIAGNOSIS — E876 Hypokalemia: Secondary | ICD-10-CM

## 2015-02-18 DIAGNOSIS — R0789 Other chest pain: Secondary | ICD-10-CM

## 2015-02-18 LAB — CBC
HEMATOCRIT: 23.5 % — AB (ref 36.0–46.0)
HEMOGLOBIN: 7.7 g/dL — AB (ref 12.0–15.0)
MCH: 27.3 pg (ref 26.0–34.0)
MCHC: 32.8 g/dL (ref 30.0–36.0)
MCV: 83.3 fL (ref 78.0–100.0)
Platelets: 181 10*3/uL (ref 150–400)
RBC: 2.82 MIL/uL — AB (ref 3.87–5.11)
RDW: 16.4 % — ABNORMAL HIGH (ref 11.5–15.5)
WBC: 5.9 10*3/uL (ref 4.0–10.5)

## 2015-02-18 LAB — RENAL FUNCTION PANEL
ALBUMIN: 2.2 g/dL — AB (ref 3.5–5.0)
ANION GAP: 8 (ref 5–15)
BUN: 49 mg/dL — ABNORMAL HIGH (ref 6–20)
CALCIUM: 8.9 mg/dL (ref 8.9–10.3)
CO2: 23 mmol/L (ref 22–32)
Chloride: 105 mmol/L (ref 101–111)
Creatinine, Ser: 2.92 mg/dL — ABNORMAL HIGH (ref 0.44–1.00)
GFR, EST AFRICAN AMERICAN: 18 mL/min — AB (ref >60)
GFR, EST NON AFRICAN AMERICAN: 16 mL/min — AB (ref >60)
GLUCOSE: 116 mg/dL — AB (ref 65–99)
PHOSPHORUS: 3.1 mg/dL (ref 2.5–4.6)
POTASSIUM: 3.3 mmol/L — AB (ref 3.5–5.1)
SODIUM: 136 mmol/L (ref 135–145)

## 2015-02-18 LAB — PROTIME-INR
INR: 1.91 — ABNORMAL HIGH (ref 0.00–1.49)
Prothrombin Time: 21.8 seconds — ABNORMAL HIGH (ref 11.6–15.2)

## 2015-02-18 LAB — ABO/RH: ABO/RH(D): B POS

## 2015-02-18 LAB — HEMOGLOBIN AND HEMATOCRIT, BLOOD
HEMATOCRIT: 29.3 % — AB (ref 36.0–46.0)
HEMOGLOBIN: 9.2 g/dL — AB (ref 12.0–15.0)

## 2015-02-18 LAB — PREPARE RBC (CROSSMATCH)

## 2015-02-18 MED ORDER — POTASSIUM CHLORIDE CRYS ER 20 MEQ PO TBCR
20.0000 meq | EXTENDED_RELEASE_TABLET | Freq: Once | ORAL | Status: AC
  Administered 2015-02-18: 20 meq via ORAL
  Filled 2015-02-18: qty 1

## 2015-02-18 MED ORDER — WARFARIN - PHARMACIST DOSING INPATIENT
Freq: Every day | Status: DC
  Administered 2015-02-18: 18:00:00

## 2015-02-18 MED ORDER — FUROSEMIDE 40 MG PO TABS
40.0000 mg | ORAL_TABLET | Freq: Every day | ORAL | Status: DC
  Administered 2015-02-19 – 2015-02-21 (×3): 40 mg via ORAL
  Filled 2015-02-18 (×3): qty 1

## 2015-02-18 MED ORDER — WARFARIN SODIUM 2.5 MG PO TABS
2.5000 mg | ORAL_TABLET | Freq: Once | ORAL | Status: AC
  Administered 2015-02-18: 2.5 mg via ORAL
  Filled 2015-02-18: qty 1

## 2015-02-18 MED ORDER — SODIUM CHLORIDE 0.9 % IV SOLN: Freq: Once | INTRAVENOUS | Status: AC

## 2015-02-18 MED ORDER — ISOSORBIDE MONONITRATE ER 30 MG PO TB24
30.0000 mg | ORAL_TABLET | Freq: Every day | ORAL | Status: DC
  Administered 2015-02-18 – 2015-02-19 (×2): 30 mg via ORAL
  Filled 2015-02-18 (×2): qty 1

## 2015-02-18 NOTE — Progress Notes (Signed)
Orders for 1 PRBCs for be transfused. Upon flushing my IVs, 1 IV was occluded and removed the other was too painful for the patient when flushed. Consulted IV team and they came and placed another IV. Before obtaining the blood I flushed new IV and it was infiltrated. Notified MD and he stated to speak with nephrology. Nephrology said no PICC but to have CCM place a central line. Notified triad MD to place CCM consult. Waiting for IV access and then will give unit of PRBC. Will continue to assess and monitor the pt closely.

## 2015-02-18 NOTE — Progress Notes (Signed)
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 Jacques Navy appointment 02/16/15) presented to [facility] on 02/14/15 with complaints of dizziness and nausea. She then developed chest pain and dyspnea while in the ED. Subsequently developed worsening dyspnea and was transferred to stepdown unit for BiPAP. Follow-up EKG revealed new LBBB. Started on NTG drip. Cardiology and nephrology consulted.   Assessment/Plan:   1. Acute on stage III chronic kidney disease inpatient with history of renal transplant 2010 and some allograft nephropathy: Nephrology consultation appreciated. Baseline creatinine 2.2-2.7. Admitted with creatinine of 3.39. Briefly hydrated with IV fluids which had to be discontinued secondary to CHF. Creatinine has improved from 3.64 > 3.34 >2.92. Nephrology follow-up appreciated. 2. Hyperkalemia: Secondary to acute kidney injury. Status post Kayexalate. Resolved. 3. Hypokalemia: Gently replace and follow BMP in a.m. 4. NSTEMI/new cardiomyopathy: Cardiology follow-up appreciated. INR supratherapeutic on Coumadin hence no heparin. Continue beta blockers, statins and low-dose aspirin. Troponin peaked: 13.48. 2-D echo: LVEF 40-45 percent with wall motion abnormality in distal anteroseptal region. Cardiology have discussed extensively with patient/family  and are recommending continued aggressive medical management and consider aggressive evaluation i.e. cardiac catheterization if her creatinine returns to baseline as it was 5-6 months ago or if she develops worsening anginal symptoms or dyspnea. Coumadin will have to be held prior to procedure. Patient at this time does not wish to proceed with. NTG stopped. Transfusing the unit of PRBC to keep hemoglobin >8 and adding Imdur. 5. Acute systolic CHF: Continue IV Lasix 40 mg daily. Improved. 6. Paroxysmal A. fib: Currently in sinus rhythm. Continue beta blockers and Coumadin per pharmacy. INR 1.9. 7. Acute respiratory failure with hypoxia: Secondary to decompensated CHF. Improved. Wean off of oxygen as tolerated to keep saturations greater than 92%. 8. Anemia of chronic kidney disease: Hemoglobin was stable in the 8 g range until dropped to 7.8>8>7.7. We will transfuse a unit of PRBC given NSTEMI. 9. Essential hypertension: Mildly uncontrolled. 10. Status post renal transplant with chronic allograft nephropathy: Continue immunosuppressants. 11. SLE/positive anticardiolipin antibodies/history of DVT/PE: On chronic Coumadin at home. INR subtherapeutic. Coumadin per pharmacy. CT chest without contrast done 1/24. 12. Right fibular and medial malleolus fracture: Dr. Ninfa Linden, orthopedic follow-up appreciated >x-ray shows that her fracture alignment actually looks worse and he recommends strict nonweightbearing status on right lower extremity. As per cardiology, patient would be high risk for surgery with a recent NSTEMI. 13. Asymptomatic Bacteriuria: Urine culture shows >100 K diphtheroids. Patient denies symptoms suggestive of UTI.    DVT prophylaxis: INR sub therapeutic on Coumadin Code Status: Full Family Communication: None at bedside today. Disposition Plan: DC home when medically stable. Continue management in stepdown unit for additional 24  hours.   Consultants:  Cardiology  Nephrology  Procedures:  Foley catheter-DC'd 1/26  Antimicrobials:  None   Subjective: Had brief episode of chest pain earlier.  Objective: Filed Vitals:   02/18/15 0812 02/18/15 0900 02/18/15 1000 02/18/15 1100  BP: 152/64 138/63 140/61 129/72  Pulse: 76 70  73 76  Temp: 98.5 F (36.9 C)     TempSrc: Axillary     Resp: 21 22 21 21   Height:      Weight:      SpO2: 100% 100% 100% 99%    Intake/Output Summary (Last 24 hours) at 02/18/15 1145 Last data filed at 02/18/15 1100  Gross per 24 hour  Intake    230 ml  Output   1700 ml  Net  -1470 ml   Filed Weights   02/15/15 0203  Weight: 65.8 kg (145 lb 1 oz)    Exam:  General exam: Pleasant middle-aged female lying comfortably propped up in bed. Respiratory system: clear to auscultation. No increased work of breathing. Cardiovascular system: S1 & S2 heard, RRR. No JVD, murmurs, gallops, clicks or pedal edema. Telemetry: Sinus rhythm. Gastrointestinal system: Abdomen is nondistended, soft and nontender. Normal bowel sounds heard.  Central nervous system: Alert and oriented. No focal neurological deficits. Extremities: Symmetric 5 x 5 power. RLE in cast below knee. Left upper arm with functioning AV fistula with thrill.   Data Reviewed: Basic Metabolic Panel:  Recent Labs Lab 02/15/15 0117 02/15/15 0527 02/16/15 0524 02/17/15 0326 02/18/15 0327  NA 132* 134* 134* 135 136  K 5.3* 5.5* 4.0 3.8 3.3*  CL 102 105 104 103 105  CO2 18* 19* 19* 21* 23  GLUCOSE 108* 97 123* 111* 116*  BUN 46* 45* 49* 50* 49*  CREATININE 3.31* 3.41* 3.64* 3.34* 2.92*  CALCIUM 9.6 9.6 9.3 9.1 8.9  MG  --  1.8  --   --   --   PHOS  --  3.1  --  3.5 3.1   Liver Function Tests:  Recent Labs Lab 02/14/15 1837 02/15/15 0527 02/17/15 0326 02/18/15 0327  AST 18 18  --   --   ALT 10* 9*  --   --   ALKPHOS 95 86  --   --   BILITOT 0.8 0.9  --   --   PROT 6.0* 5.8*  --   --   ALBUMIN 2.9*  2.8* 2.4* 2.2*   No results for input(s): LIPASE, AMYLASE in the last 168 hours. No results for input(s): AMMONIA in the last 168 hours. CBC:  Recent Labs Lab 02/14/15 1837 02/15/15 0527 02/16/15 0524 02/17/15 0326 02/17/15 1245 02/18/15 0327  WBC 6.3 6.3 7.6 6.7 6.7 5.9  NEUTROABS 4.7  --   --   --   --   --   HGB 8.6* 8.5* 8.7* 7.8* 8.0* 7.7*  HCT 27.4* 26.3* 26.8* 24.4* 24.7* 23.5*  MCV 82.8 83.0 84.3 82.7 83.2 83.3  PLT 199 198 177 188 185 181   Cardiac Enzymes:  Recent Labs Lab 02/15/15 0527 02/15/15 1827 02/16/15 0524 02/16/15 1158 02/16/15 1836  TROPONINI <0.03 13.13* 13.48* 9.78* 7.34*   BNP (last 3 results) No results for input(s): PROBNP in the last 8760 hours. CBG:  Recent Labs Lab 02/15/15 0201  GLUCAP 97    Recent Results (from the past 240 hour(s))  MRSA PCR Screening     Status: None   Collection Time: 02/15/15 10:46 AM  Result Value Ref Range Status   MRSA by PCR NEGATIVE NEGATIVE Final    Comment:        The GeneXpert MRSA Assay (FDA approved for NASAL specimens only), is one component of a comprehensive MRSA colonization surveillance program. It is not intended to diagnose MRSA infection nor to guide or monitor treatment for MRSA infections.  Culture, Urine     Status: None   Collection Time: 02/15/15  1:52 PM  Result Value Ref Range Status   Specimen Description URINE, CATHETERIZED  Final   Special Requests Normal  Final   Culture   Final    >=100,000 COLONIES/mL DIPHTHEROIDS(CORYNEBACTERIUM SPECIES) Standardized susceptibility testing for this organism is not available.    Report Status 02/16/2015 FINAL  Final         Studies: Dg Ankle Complete Right  02/17/2015  CLINICAL DATA:  Right ankle pain and deformity, fall in hallway January 22, 2015 EXAM: RIGHT ANKLE - COMPLETE 3+ VIEW COMPARISON:  01/23/2015 FINDINGS: There is an oblique fracture of the lateral malleolus. There is transverse fracture of the medial malleolus.  There is dislocation of the tibiotalar joint with lateral rotation of the talus. Although no definite posterior malleolar fracture is identified, there is significant artifact along the posterior aspect of the ankle on the lateral view. There is dense atherosclerotic calcification of the small vessels. IMPRESSION: 1. Bimalleolar fracture of the ankle. 2. Tibiotalar dislocation, increased significantly since prior study. Electronically Signed   By: Nolon Nations M.D.   On: 02/17/2015 14:30        Scheduled Meds: . sodium chloride   Intravenous Once  . aspirin EC  81 mg Oral QPM  . atorvastatin  40 mg Oral q1800  . bimatoprost  1 drop Both Eyes QHS  . calcitRIOL  0.25 mcg Oral QODAY  . calcitRIOL  0.5 mcg Oral QODAY  . carvedilol  6.25 mg Oral BID WC  . darbepoetin (ARANESP) injection - NON-DIALYSIS  60 mcg Subcutaneous Q Wed-1800  . famotidine  10 mg Oral Daily  . furosemide  40 mg Intravenous Daily  . isosorbide mononitrate  30 mg Oral Daily  . methocarbamol  500 mg Oral BID  . mycophenolate  180 mg Oral BID  . predniSONE  5 mg Oral Q breakfast  . sodium bicarbonate  1,300 mg Oral BID  . sodium chloride  3 mL Intravenous Q12H  . tacrolimus  2 mg Oral BID  . timolol  1 drop Both Eyes BID  . warfarin  2.5 mg Oral ONCE-1800  . Warfarin - Pharmacist Dosing Inpatient   Does not apply q1800   Continuous Infusions: . sodium chloride Stopped (02/17/15 1054)    Active Problems:   Kidney transplant as cause of abnormal reaction or later complication   PAF (paroxysmal atrial fibrillation) (HCC)   HTN (hypertension)   Chronic anticoagulation - with Coumadin   AKI (acute kidney injury) ([place])   Acute renal failure superimposed on stage 3 chronic kidney disease (HCC)   Dehydration   Chest pain   Acute on chronic renal failure (HCC)   Acute systolic heart failure (HCC)   Hyperkalemia   NSTEMI (non-ST elevated myocardial infarction) (HCC)   Anemia in CKD (chronic kidney  disease)    Time spent: 20 minutes.    Vernell Leep, MD, FACP, FHM. [facility] Pager [phone]  If 7PM-7AM, please contact night-coverage [email or web address] Password [email or web address] 02/18/2015, 11:45 AM    LOS: 4 days

## 2015-02-18 NOTE — Progress Notes (Signed)
Patient ID: Debbie [name], female   DOB: August 03, 1950, 65 y.o.   MRN: [id number] S:Feels better O:BP 153/62 mmHg  Pulse 83  Temp(Src) 97.6 F (36.4 C) (Oral)  Resp 16  Ht 5\' 1"  (1.549 m)  Wt 65.8 kg (145 lb 1 oz)  BMI 27.42 kg/m2  SpO2 97%  Intake/Output Summary (Last 24 hours) at 02/18/15 0757 Last data filed at 02/18/15 0600  Gross per 24 hour  Intake    642 ml  Output   1700 ml  Net  -1058 ml   Intake/Output: I/O last 3 completed shifts: In: 785 [P.O.:480; I.V.:195; IV Piggyback:110] Out: 2020 [Urine:2020]  Intake/Output this shift:    Weight change:  Gen:WD WN AAF in NAD CVS:no rub Resp:cta [id number] Ext:no edema, RUE AVG +T/B   Recent Labs Lab 02/14/15 1837 02/15/15 0117 02/15/15 0527 02/16/15 0524 02/17/15 0326 02/18/15 0327  NA 133* 132* 134* 134* 135 136  K 5.3* 5.3* 5.5* 4.0 3.8 3.3*  CL 102 102 105 104 103 105  CO2 19* 18* 19* 19* 21* 23  GLUCOSE 105* 108* 97 123* 111* 116*  BUN 46* 46* 45* 49* 50* 49*  CREATININE 3.39* 3.31* 3.41* 3.64* 3.34* 2.92*  ALBUMIN 2.9*  --  2.8*  --  2.4* 2.2*  CALCIUM 10.0 9.6 9.6 9.3 9.1 8.9  PHOS  --   --  3.1  --  3.5 3.1  AST 18  --  18  --   --   --   ALT 10*  --  9*  --   --   --    Liver Function Tests:  Recent Labs Lab 02/14/15 1837 02/15/15 0527 02/17/15 0326 02/18/15 0327  AST 18 18  --   --   ALT 10* 9*  --   --   ALKPHOS 95 86  --   --   BILITOT 0.8 0.9  --   --   PROT 6.0* 5.8*  --   --   ALBUMIN 2.9* 2.8* 2.4* 2.2*   No results for input(s): LIPASE, AMYLASE in the last 168 hours. No results for input(s): AMMONIA in the last 168 hours. CBC:  Recent Labs Lab 02/14/15 1837 02/15/15 0527 02/16/15 0524 02/17/15 0326 02/17/15 1245 02/18/15 0327  WBC 6.3 6.3 7.6 6.7 6.7 5.9  NEUTROABS 4.7  --   --   --   --   --   HGB 8.6* 8.5* 8.7* 7.8* 8.0* 7.7*  HCT 27.4* 26.3* 26.8* 24.4* 24.7* 23.5*  MCV 82.8 83.0 84.3 82.7 83.2 83.3  PLT 199 198 177 188 185 181   Cardiac Enzymes:  Recent  Labs Lab 02/15/15 0527 02/15/15 1827 02/16/15 0524 02/16/15 1158 02/16/15 1836  TROPONINI <0.03 13.13* 13.48* 9.78* 7.34*   CBG:  Recent Labs Lab 02/15/15 0201  GLUCAP 97    Iron Studies: No results for input(s): IRON, TIBC, TRANSFERRIN, FERRITIN in the last 72 hours. Studies/Results: Dg Ankle Complete Right  02/17/2015  CLINICAL DATA:  Right ankle pain and deformity, fall in hallway January 22, 2015 EXAM: RIGHT ANKLE - COMPLETE 3+ VIEW COMPARISON:  01/23/2015 FINDINGS: There is an oblique fracture of the lateral malleolus. There is transverse fracture of the medial malleolus. There is dislocation of the tibiotalar joint with lateral rotation of the talus. Although no definite posterior malleolar fracture is identified, there is significant artifact along the posterior aspect of the ankle on the lateral view. There is dense atherosclerotic calcification of the small vessels. IMPRESSION: 1. Bimalleolar fracture of  the ankle. 2. Tibiotalar dislocation, increased significantly since prior study. Electronically Signed   By: Nolon Nations M.D.   On: 02/17/2015 14:30   . sodium chloride   Intravenous Once  . aspirin EC  81 mg Oral QPM  . atorvastatin  40 mg Oral q1800  . bimatoprost  1 drop Both Eyes QHS  . calcitRIOL  0.25 mcg Oral QODAY  . calcitRIOL  0.5 mcg Oral QODAY  . carvedilol  6.25 mg Oral BID WC  . darbepoetin (ARANESP) injection - NON-DIALYSIS  60 mcg Subcutaneous Q Wed-1800  . famotidine  10 mg Oral Daily  . furosemide  40 mg Intravenous Daily  . methocarbamol  500 mg Oral BID  . mycophenolate  180 mg Oral BID  . potassium chloride  20 mEq Oral Once  . predniSONE  5 mg Oral Q breakfast  . sodium bicarbonate  1,300 mg Oral BID  . sodium chloride  3 mL Intravenous Q12H  . tacrolimus  2 mg Oral BID  . timolol  1 drop Both Eyes BID    BMET    Component Value Date/Time   NA 136 02/18/2015 0327   K 3.3* 02/18/2015 0327   CL 105 02/18/2015 0327   CO2 23 02/18/2015  0327   GLUCOSE 116* 02/18/2015 0327   BUN 49* 02/18/2015 0327   CREATININE 2.92* 02/18/2015 0327   CALCIUM 8.9 02/18/2015 0327   GFRNONAA 16* 02/18/2015 0327   GFRAA 18* 02/18/2015 0327   CBC    Component Value Date/Time   WBC 5.9 02/18/2015 0327   RBC 2.82* 02/18/2015 0327   RBC 3.17* 02/15/2015 0527   HGB 7.7* 02/18/2015 0327   HCT 23.5* 02/18/2015 0327   PLT 181 02/18/2015 0327   MCV 83.3 02/18/2015 0327   MCH 27.3 02/18/2015 0327   MCHC 32.8 02/18/2015 0327   RDW 16.4* 02/18/2015 0327   LYMPHSABS 1.3 02/14/2015 1837   MONOABS 0.3 02/14/2015 1837   EOSABS 0.0 02/14/2015 1837   BASOSABS 0.0 02/14/2015 1837     Assessment/Plan: 1. AKI/CKD- Pt with underlying chronic allograft dysfunction now with acute CHF requiring IV diuresis and rising troponin, suspicious for ischemia. Continue to follow for now and will need to discuss risks and benefits of cardiac cath with patient and family.  1. Scr improved slightly over last 24 hours 2. Switch to po furosemide 40mg  daily for now 2. SSCP with rising troponin- up to 13. Cardiology appropriately concerned about rising Scr. Will cont to follow and discuss risks/benefits of cardiac cath, however I did tell the family that she may need to risk CIN if she develops unstable angina or EKG changes. Appreciate cardiology's thoughtful care. 1. Pt does not want any intervention done at this time and would like to wait 3. Hyperkalemia due to #1. improved after kayexalate. Continue to follow. 4. SOB related to pulmonary edema/CHF- treated with lasix and improved. Continue to follow. Oxygen saturations improved with diuresis and off of BiPap 5. Respiratory distress- as above 6. Anemia of chronic kidney disease- will start ESA, and IV iron (although ferritin is markedly elevated) 1. hgb dropped to 7.8, consider transfusion of 1 unit PRBC's since she has elevated troponins and chest pain. 7. HTN- stable 8. S/p DDKT with chronic allograft  dysfunction- continue with immunosuppressive agents and follow renal function. 9. Vascular access- RUE AVG access +T/B 10. CAD with intermittent CP- Cardiology following. 11. 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 So doubtful that chest pain  was PE. 12. Severe Protein malnutrition- supplement per nutrition 13. RLE fxs- non-weight-bearing cast in place.  Appreciate ortho eval, unfortunately alignment is worse and may require surger.  Lane

## 2015-02-18 NOTE — Procedures (Signed)
Central Venous Catheter Insertion Procedure Note Debbie [name] [id number] Apr 24, 1950  Procedure: Insertion of Central Venous Catheter Indications: Assessment of intravascular volume, Drug and/or fluid administration and Frequent blood sampling  Procedure Details Consent: Risks of procedure as well as the alternatives and risks of each were explained to the (patient/caregiver).  Consent for procedure obtained. Time Out: Verified patient identification, verified procedure, site/side was marked, verified correct patient position, special equipment/implants available, medications/allergies/relevent history reviewed, required imaging and test results available.  Performed  Maximum sterile technique was used including antiseptics, cap, gloves, gown, hand hygiene, mask and sheet. Skin prep: Chlorhexidine; local anesthetic administered A antimicrobial bonded/coated triple lumen catheter was placed in the left internal jugular vein to 17 cm using the Seldinger technique.  Evaluation Blood flow good Complications: No apparent complications Patient did tolerate procedure well. Chest X-ray ordered to verify placement.  CXR: pending.    Procedure performed under direct supervision of Dr. Titus Mould and with ultrasound guidance for real time vessel cannulation.     Debbie Gens, NP-C [facility] Pgr: [phone] or if no answer [phone] 02/18/2015, 2:42 PM  [place] Tolerated  Debbie [name]. Titus Mould, MD, [place] Pgr: [facility]

## 2015-02-18 NOTE — Progress Notes (Signed)
[place] for Coumadin Indication: atrial fibrillation  Labs:  Recent Labs  02/16/15 0524 02/16/15 1158 02/16/15 1836 02/17/15 0326 02/17/15 1245 02/18/15 0327  HGB 8.7*  --   --  7.8* 8.0* 7.7*  HCT 26.8*  --   --  24.4* 24.7* 23.5*  PLT 177  --   --  188 185 181  LABPROT 36.3*  --   --  29.3*  --  21.8*  INR 3.76*  --   --  2.84*  --  1.91*  CREATININE 3.64*  --   --  3.34*  --  2.92*  TROPONINI 13.48* 9.78* 7.34*  --   --   --     Estimated Creatinine Clearance: 16.7 mL/min (by C-G formula based on Cr of 2.92).  Assessment: 65 y.o. female admitted with h/o Afib/DVT and noted to have antiphospholipid, On warfarin PTA. INR on admission supratherapeutic at 3.86. Warfarin was held d/t supratherapeutic level and possible cath. Patient now refusing cath, pharmacy consulted to restart warfarin.   PTA Warfarin 2.5mg  Mon/Fri, 1.25 all other days  INR 1.91, Hgb 7.7, plt wnl  Goal of Therapy:  INR 2-3 Monitor platelets by anticoagulation protocol: Yes   Plan:  Warfarin 2.5mg  tonight Daily INR/CBC Monitor for s/sx of bleeding  Darl Pikes, PharmD Clinical Pharmacist- Resident Pager: [phone]  02/18/2015 10:59 AM

## 2015-02-18 NOTE — Progress Notes (Signed)
Cardiologist: Dr. Radford Pax  Subjective:   [id number] L Upright is a 65 y.o. year old female with a history of chest pain, last MV 06/2013 w/ some ischemia (results below), but medical therapy recommended due to poor renal function. Also with history of HTN, HLD, PAF on coumadin, SLE, positive anticardiolipin antibody, ESRD s/p renal transplant 2010, DVT. Severe pulmonary HTN w/ PCWP 24 mmHg at R heart cath 08/2013. Mechanical fall 01/23/2015 w/ RLE oblique fracture through the distal fibular diaphysis and oblique medial malleolar fracture, in a non-weight-bearing cast.  On 02/15/15 to develop respiratory distress requiring BiPAP, mild dose of Ativan. Troponin elevation consistent with non-ST elevation myocardial infarction, wide QRS, EF moderately reduced on echocardiogram.  Resting comfortably this morning. Had brief episode of CP this AM, no SOB. Ankle not healing well.   Objective:  Vital Signs in the last 24 hours: Temp:  [97.6 F (36.4 C)-98.5 F (36.9 C)] 98.5 F (36.9 C) (01/27 0812) Pulse Rate:  [37-83] 76 (01/27 0812) Resp:  [13-25] 21 (01/27 0812) BP: (125-165)/(55-72) 152/64 mmHg (01/27 0812) SpO2:  [97 %-100 %] 100 % (01/27 0812)  Intake/Output from previous day: 01/26 0701 - 01/27 0700 In: 642 [P.O.:480; I.V.:52; IV Piggyback:110] Out: 1700 [Urine:1700]   Physical Exam: General: Well developed, well nourished, in no acute distress, somewhat frail. Head:  Normocephalic and atraumatic. Lungs: No significant crackles or wheezes heard on exam. Heart: Normal S1 and S2.  2/6 S murmur, no rubs or gallops.  Abdomen: soft, non-tender, positive bowel sounds. Extremities: No clubbing or cyanosis. No significant edema. Neurologic: Alert and oriented x 3.    Lab Results:  Recent Labs  02/17/15 1245 02/18/15 0327  WBC 6.7 5.9  HGB 8.0* 7.7*  PLT 185 181    Recent Labs  02/17/15 0326 02/18/15 0327  NA 135 136  K 3.8 3.3*  CL 103 105  CO2 21* 23  GLUCOSE 111* 116*   BUN 50* 49*  CREATININE 3.34* 2.92*    Recent Labs  02/16/15 1158 02/16/15 1836  TROPONINI 9.78* 7.34*   INR 1.9 Hepatic Function Panel  Recent Labs  02/18/15 0327  ALBUMIN 2.2*   No results for input(s): CHOL in the last 72 hours. No results for input(s): PROTIME in the last 72 hours.  Imaging: Dg Ankle Complete Right  02/17/2015  CLINICAL DATA:  Right ankle pain and deformity, fall in hallway January 22, 2015 EXAM: RIGHT ANKLE - COMPLETE 3+ VIEW COMPARISON:  01/23/2015 FINDINGS: There is an oblique fracture of the lateral malleolus. There is transverse fracture of the medial malleolus. There is dislocation of the tibiotalar joint with lateral rotation of the talus. Although no definite posterior malleolar fracture is identified, there is significant artifact along the posterior aspect of the ankle on the lateral view. There is dense atherosclerotic calcification of the small vessels. IMPRESSION: 1. Bimalleolar fracture of the ankle. 2. Tibiotalar dislocation, increased significantly since prior study. Electronically Signed   By: Nolon Nations M.D.   On: 02/17/2015 14:30   Personally viewed.   Telemetry: No adverse arrhythmias, no VT Personally viewed.   EKG:  EKG this morning shows narrow complex QRS with nonspecific ST-T wave changes. Yesterday she had a widened QRS complex. Personally viewed.  Cardiac Studies:  Echocardiogram 02/15/15: - Normal LV size with mild LV hypertrophy. EF 40-45% with diffuse hypokinesis, septal-lateral dyssynchrony. Normal RV size and systolic function. Calcified mitral valve and annulus, mild to moderate MR. Elevated mean gradient across the mitral valve probably due  to high flow (does not appear to have significant stenosis). Aortic sclerosis without significant stenosis. Mild aortic insufficiency. Moderate pulmonary hypertension.  Meds: Scheduled Meds: . sodium chloride   Intravenous Once  . aspirin EC  81 mg Oral QPM  .  atorvastatin  40 mg Oral q1800  . bimatoprost  1 drop Both Eyes QHS  . calcitRIOL  0.25 mcg Oral QODAY  . calcitRIOL  0.5 mcg Oral QODAY  . carvedilol  6.25 mg Oral BID WC  . darbepoetin (ARANESP) injection - NON-DIALYSIS  60 mcg Subcutaneous Q Wed-1800  . famotidine  10 mg Oral Daily  . furosemide  40 mg Intravenous Daily  . isosorbide mononitrate  30 mg Oral Daily  . methocarbamol  500 mg Oral BID  . mycophenolate  180 mg Oral BID  . predniSONE  5 mg Oral Q breakfast  . sodium bicarbonate  1,300 mg Oral BID  . sodium chloride  3 mL Intravenous Q12H  . tacrolimus  2 mg Oral BID  . timolol  1 drop Both Eyes BID   Continuous Infusions: . sodium chloride Stopped (02/17/15 1054)   PRN Meds:.sodium chloride, acetaminophen **OR** acetaminophen, LORazepam, morphine injection, ondansetron **OR** [DISCONTINUED] ondansetron (ZOFRAN) IV, traMADol  Assessment/Plan:  Active Problems:   Kidney transplant as cause of abnormal reaction or later complication   PAF (paroxysmal atrial fibrillation) (HCC)   HTN (hypertension)   Chronic anticoagulation - with Coumadin   AKI (acute kidney injury) ([place])   Acute renal failure superimposed on stage 3 chronic kidney disease (HCC)   Dehydration   Chest pain   Acute on chronic renal failure (HCC)   Acute systolic heart failure (HCC)   Hyperkalemia   NSTEMI (non-ST elevated myocardial infarction) (HCC)   Anemia in CKD (chronic kidney disease)  Non-ST elevation myocardial infarction  - Continue with beta blocker  - Continue with statin  - Continue with low-dose aspirin  - Troponin peak, 13 trending down.   - Ejection fraction 40-45% with what appears to be a wall motion abnormality in the distal anteroseptal region.  - Her QRS complex was widened in the setting of her respiratory distress yesterday but now QRS is now narrowed.  - We had lengthy discussion previously with her and her family and friends about the risks and benefits of potential cardiac  catheterization, utilization of IV contrast and its potential implications for worsening acute kidney injury, hemodialysis. This is a very challenging situation. Regardless, we would need to hold her Coumadin if we decided to pursue cardiac catheterization.   At this point, given the marked improvement in her symptoms, resolution of respiratory distress, I am weighing on the side of continued aggressive medical management. If her creatinine returns back to baseline as it was 5-6 months ago, we could consider cardiac catheterization. Once again, we would have to hold her Coumadin. Another option is if she begins to develop worsening anginal symptoms, more shortness of breath, our hand may be forced in a sense to pursue diagnostic angiogram.  Continue to collaborate with nephrology on this matter. She does not wish to proceed with cath at this time.   - Started Imdur  Chronic anticoagulation  - Has history of atrial fibrillation as well as remote DVT  - Currently INR 1.9. No heparin IV because of worsening anemia  - Appreciate pharmacy team assistance  Status post renal transplant  - Nephrology  - Medications reviewed  Acute systolic heart failure  - EF 40-45%  - Marked improvement.  -  Gentle IV Lasix 40 mg daily. Appreciate nephrology's assistance.  Paroxysmal atrial fibrillation  - Current sinus rhythm  Anemia  - Hg drop  - Getting one unit PRBC  Ankle fracture  - per Dr. Ninfa Linden looks worse, may need surgery. Needs to remain strictly non weight bearing.  - Would be HIGH risk for surgery with her recent NSTEMI.  - If cath were done, PCI performed, this would also delay surgery given need for DAPT.      SKAINS, Wilson 02/18/2015, 9:09 AM

## 2015-02-19 LAB — RENAL FUNCTION PANEL
ALBUMIN: 2.4 g/dL — AB (ref 3.5–5.0)
ANION GAP: 10 (ref 5–15)
BUN: 51 mg/dL — AB (ref 6–20)
CHLORIDE: 102 mmol/L (ref 101–111)
CO2: 25 mmol/L (ref 22–32)
Calcium: 9.1 mg/dL (ref 8.9–10.3)
Creatinine, Ser: 2.54 mg/dL — ABNORMAL HIGH (ref 0.44–1.00)
GFR calc Af Amer: 22 mL/min — ABNORMAL LOW (ref >60)
GFR calc non Af Amer: 19 mL/min — ABNORMAL LOW (ref >60)
GLUCOSE: 111 mg/dL — AB (ref 65–99)
PHOSPHORUS: 2.4 mg/dL — AB (ref 2.5–4.6)
POTASSIUM: 3.8 mmol/L (ref 3.5–5.1)
Sodium: 137 mmol/L (ref 135–145)

## 2015-02-19 LAB — TYPE AND SCREEN
ABO/RH(D): B POS
ANTIBODY SCREEN: NEGATIVE
UNIT DIVISION: 0

## 2015-02-19 LAB — CBC
HEMATOCRIT: 28.6 % — AB (ref 36.0–46.0)
HEMOGLOBIN: 9.1 g/dL — AB (ref 12.0–15.0)
MCH: 26.7 pg (ref 26.0–34.0)
MCHC: 31.8 g/dL (ref 30.0–36.0)
MCV: 83.9 fL (ref 78.0–100.0)
Platelets: 178 10*3/uL (ref 150–400)
RBC: 3.41 MIL/uL — ABNORMAL LOW (ref 3.87–5.11)
RDW: 16.2 % — ABNORMAL HIGH (ref 11.5–15.5)
WBC: 6.2 10*3/uL (ref 4.0–10.5)

## 2015-02-19 LAB — PROTIME-INR
INR: 1.71 — AB (ref 0.00–1.49)
Prothrombin Time: 20.1 seconds — ABNORMAL HIGH (ref 11.6–15.2)

## 2015-02-19 MED ORDER — SODIUM CHLORIDE 0.9% FLUSH
10.0000 mL | INTRAVENOUS | Status: DC | PRN
Start: 2015-02-19 — End: 2015-02-21

## 2015-02-19 MED ORDER — WARFARIN SODIUM 2.5 MG PO TABS
2.5000 mg | ORAL_TABLET | Freq: Once | ORAL | Status: AC
  Administered 2015-02-19: 2.5 mg via ORAL
  Filled 2015-02-19: qty 1

## 2015-02-19 MED ORDER — SODIUM CHLORIDE 0.9% FLUSH
10.0000 mL | Freq: Two times a day (BID) | INTRAVENOUS | Status: DC
  Administered 2015-02-19: 30 mL
  Administered 2015-02-19 – 2015-02-20 (×2): 10 mL
  Administered 2015-02-20: 30 mL

## 2015-02-19 NOTE — Progress Notes (Signed)
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 Jacques Navy appointment 02/16/15) presented to [facility] on 02/14/15 with complaints of dizziness and nausea. She then developed chest pain and dyspnea while in the ED. Subsequently developed worsening dyspnea and was transferred to stepdown unit for BiPAP. Follow-up EKG revealed new LBBB. Started on NTG drip. Cardiology and nephrology consulted.   Assessment/Plan:   1. Acute on stage III chronic kidney disease inpatient with history of renal transplant 2010 and some allograft nephropathy: Nephrology consultation appreciated. Baseline creatinine 2.2-2.7. Admitted with creatinine of 3.39. Briefly hydrated with IV fluids which had to be discontinued secondary to CHF. Creatinine improving 3.64 > 3.34 >2.92> 2.54. Nephrology follow-up appreciated-signed off 1/28. Outpatient follow-up with Dr. Jimmy Footman in Feb 2. Hyperkalemia: Secondary to acute kidney injury. Status post Kayexalate. Resolved. 3. Hypokalemia: replaced 4. NSTEMI/new cardiomyopathy: Cardiology follow-up appreciated. Continue beta blockers, statins and low-dose aspirin. Troponin peaked: 13.48. 2-D echo: LVEF 40-45 percent with wall motion abnormality in distal anteroseptal region. Cardiology have discussed extensively with patient/family and are recommending  continued aggressive medical management and consider aggressive evaluation i.e. cardiac catheterization if her creatinine returns to baseline as it was 5-6 months ago or if she develops worsening anginal symptoms or dyspnea. Coumadin will have to be held prior to procedure. Patient at this time does not wish to proceed with. NTG stopped. S/P 1 unit PRBC on 1/27 and hemoglobin has improved to >9 g. Added Imdur. 5. Acute systolic CHF: Lasix changed from IV to oral 40 mg daily. Compensated. 6. Paroxysmal A. fib: Currently in sinus rhythm. Continue beta blockers and Coumadin per pharmacy. INR 1.7. 7. Acute respiratory failure with hypoxia: Secondary to decompensated CHF. Improved. Wean off of oxygen as tolerated to keep saturations greater than 92%-discussed with RN. 8. Anemia of chronic kidney disease: Hemoglobin was stable in the 8 g range until dropped to 7.8>8>7.7 on 1/27. S/P 1 unit of PRBC given NSTEMI & hemoglobin has improved to 9.1 g per DL. Follow CBCs. 9. Essential hypertension: controlled. 10. Status post renal transplant with chronic allograft nephropathy: Continue immunosuppressants. 11. SLE/positive anticardiolipin antibodies/history of DVT/PE: On chronic Coumadin at home. INR subtherapeutic. Coumadin per pharmacy. CT chest without contrast done 1/24. 12. Right fibular and medial malleolus fracture: Dr. Ninfa Linden, orthopedic follow-up appreciated >x-ray shows that her fracture alignment actually looks worse and he recommends strict nonweightbearing status on right lower extremity. As per cardiology, patient would be high risk for surgery with a recent NSTEMI. Orthopedics is seen today and given her current medical status, recommend treating her right ankle fracture nonoperatively. She will be placed on new plaster splint today. Outpatient follow-up with Dr. Jean Rosenthal in 2 weeks. 13. Asymptomatic Bacteriuria: Urine culture shows >100 K diphtheroids. Patient denies symptoms suggestive of  UTI.    DVT prophylaxis: INR sub therapeutic on Coumadin Code Status: Full Family Communication: None at bedside today. Disposition Plan: Transfer to telemetry (cleared by cardiology). SNF possibly early next week.   Consultants:  Cardiology  Nephrology-signed off 1/28  CCM-consulted 1/27 for placement of central line.  Procedures:  Foley catheter-DC'd 1/26  Left IJ central line 1/27-placed due to lack of good IV access  Antimicrobials:  None   Subjective: Denies complaints. No chest pain reported. As per RN, no acute issues.  Objective: Filed Vitals:   02/19/15 0800 02/19/15 0900 02/19/15 1133 02/19/15 1200  BP:   124/64   Pulse:   82   Temp:   97.9 F (36.6 C)   TempSrc:   Oral   Resp:   19   Height:      Weight:      SpO2: 100% 94% 98% 100%    Intake/Output Summary (Last 24 hours) at 02/19/15 1415 Last data filed at 02/19/15 1136  Gross per 24 hour  Intake    815 ml  Output    700 ml  Net    115 ml   Filed Weights   02/15/15 0203  Weight: 65.8 kg (145 lb 1 oz)    Exam:  General exam: Pleasant middle-aged female lying comfortably propped up in bed. Respiratory system: clear to auscultation. No increased work of breathing. Cardiovascular system: S1 & S2 heard, RRR. No JVD, murmurs, gallops, clicks or pedal edema. Telemetry: Sinus rhythm. Gastrointestinal system: Abdomen is nondistended, soft and nontender. Normal bowel sounds heard.  Central nervous system: Alert and oriented. No focal neurological deficits. Extremities: Symmetric 5 x 5 power. RLE without cast this a.m-plans for placing new one. Left upper arm with functioning AV fistula with thrill.   Data Reviewed: Basic Metabolic Panel:  Recent Labs Lab 02/15/15 0527 02/16/15 0524 02/17/15 0326 02/18/15 0327 02/19/15 0428  NA 134* 134* 135 136 137  K 5.5* 4.0 3.8 3.3* 3.8  CL 105 104 103 105 102  CO2 19* 19* 21* 23 25  GLUCOSE 97 123* 111* 116* 111*  BUN 45* 49* 50* 49* 51*    CREATININE 3.41* 3.64* 3.34* 2.92* 2.54*  CALCIUM 9.6 9.3 9.1 8.9 9.1  MG 1.8  --   --   --   --   PHOS 3.1  --  3.5 3.1 2.4*   Liver Function Tests:  Recent Labs Lab 02/14/15 1837 02/15/15 0527 02/17/15 0326 02/18/15 0327 02/19/15 0428  AST 18 18  --   --   --   ALT 10* 9*  --   --   --   ALKPHOS 95 86  --   --   --   BILITOT 0.8 0.9  --   --   --   PROT 6.0* 5.8*  --   --   --   ALBUMIN 2.9* 2.8* 2.4* 2.2* 2.4*   No results for input(s): LIPASE, AMYLASE in the last 168 hours. No results for input(s): AMMONIA in the last 168 hours. CBC:  Recent Labs Lab 02/14/15 1837  02/16/15 0524 02/17/15 0326 02/17/15 1245 02/18/15 0327 02/18/15 2059 02/19/15 0428  WBC 6.3  < > 7.6 6.7 6.7 5.9  --  6.2  NEUTROABS 4.7  --   --   --   --   --   --   --   HGB 8.6*  < > 8.7* 7.8* 8.0* 7.7* 9.2* 9.1*  HCT 27.4*  < > 26.8* 24.4* 24.7* 23.5* 29.3* 28.6*  MCV 82.8  < > 84.3 82.7 83.2 83.3  --  83.9  PLT 199  < > 177 188 185 181  --  178  < > = values in this interval not  displayed. Cardiac Enzymes:  Recent Labs Lab 02/15/15 0527 02/15/15 1827 02/16/15 0524 02/16/15 1158 02/16/15 1836  TROPONINI <0.03 13.13* 13.48* 9.78* 7.34*   BNP (last 3 results) No results for input(s): PROBNP in the last 8760 hours. CBG:  Recent Labs Lab 02/15/15 0201  GLUCAP 97    Recent Results (from the past 240 hour(s))  MRSA PCR Screening     Status: None   Collection Time: 02/15/15 10:46 AM  Result Value Ref Range Status   MRSA by PCR NEGATIVE NEGATIVE Final    Comment:        The GeneXpert MRSA Assay (FDA approved for NASAL specimens only), is one component of a comprehensive MRSA colonization surveillance program. It is not intended to diagnose MRSA infection nor to guide or monitor treatment for MRSA infections.   Culture, Urine     Status: None   Collection Time: 02/15/15  1:52 PM  Result Value Ref Range Status   Specimen Description URINE, CATHETERIZED  Final   Special  Requests Normal  Final   Culture   Final    >=100,000 COLONIES/mL DIPHTHEROIDS(CORYNEBACTERIUM SPECIES) Standardized susceptibility testing for this organism is not available.    Report Status 02/16/2015 FINAL  Final         Studies: Dg Ankle Complete Right  02/17/2015  CLINICAL DATA:  Right ankle pain and deformity, fall in hallway January 22, 2015 EXAM: RIGHT ANKLE - COMPLETE 3+ VIEW COMPARISON:  01/23/2015 FINDINGS: There is an oblique fracture of the lateral malleolus. There is transverse fracture of the medial malleolus. There is dislocation of the tibiotalar joint with lateral rotation of the talus. Although no definite posterior malleolar fracture is identified, there is significant artifact along the posterior aspect of the ankle on the lateral view. There is dense atherosclerotic calcification of the small vessels. IMPRESSION: 1. Bimalleolar fracture of the ankle. 2. Tibiotalar dislocation, increased significantly since prior study. Electronically Signed   By: Nolon Nations M.D.   On: 02/17/2015 14:30   Dg Chest Port 1 View  02/18/2015  CLINICAL DATA:  Central line placement EXAM: PORTABLE CHEST 1 VIEW COMPARISON:  02/16/2015 FINDINGS: Cardiomediastinal silhouette is stable. No pulmonary edema. Tiny right pleural effusion. There is left IJ central line with tip in SVC. No pneumothorax. No segmental infiltrate. IMPRESSION: Left IJ central line in place.  No pneumothorax. Electronically Signed   By: Lahoma Crocker M.D.   On: 02/18/2015 14:44        Scheduled Meds: . aspirin EC  81 mg Oral QPM  . atorvastatin  40 mg Oral q1800  . bimatoprost  1 drop Both Eyes QHS  . calcitRIOL  0.25 mcg Oral QODAY  . calcitRIOL  0.5 mcg Oral QODAY  . carvedilol  6.25 mg Oral BID WC  . darbepoetin (ARANESP) injection - NON-DIALYSIS  60 mcg Subcutaneous Q Wed-1800  . famotidine  10 mg Oral Daily  . furosemide  40 mg Oral Daily  . isosorbide mononitrate  30 mg Oral Daily  . methocarbamol  500 mg  Oral BID  . mycophenolate  180 mg Oral BID  . predniSONE  5 mg Oral Q breakfast  . sodium bicarbonate  1,300 mg Oral BID  . sodium chloride flush  10-40 mL Intracatheter Q12H  . tacrolimus  2 mg Oral BID  . timolol  1 drop Both Eyes BID  . Warfarin - Pharmacist Dosing Inpatient   Does not apply q1800   Continuous Infusions: . sodium chloride Stopped (02/17/15 1054)  Active Problems:   Kidney transplant as cause of abnormal reaction or later complication   PAF (paroxysmal atrial fibrillation) (HCC)   HTN (hypertension)   Chronic anticoagulation - with Coumadin   AKI (acute kidney injury) ([place])   Acute renal failure superimposed on stage 3 chronic kidney disease (HCC)   Dehydration   Chest pain   Acute on chronic renal failure (HCC)   Acute systolic heart failure (HCC)   Hyperkalemia   NSTEMI (non-ST elevated myocardial infarction) (HCC)   Anemia in CKD (chronic kidney disease)   Hypokalemia    Time spent: 20 minutes.    Vernell Leep, MD, FACP, FHM. [facility] Pager [phone]  If 7PM-7AM, please contact night-coverage [email or web address] Password [email or web address] 02/19/2015, 2:15 PM    LOS: 5 days

## 2015-02-19 NOTE — Progress Notes (Signed)
Orthopedic Tech Progress Note Patient Details:  Debbie [name] 06/16/1950 [id number]  Ortho Devices Type of Ortho Device: Ace wrap, Post (short leg) splint, Stirrup splint Ortho Device/Splint Location: rle Ortho Device/Splint Interventions: Application   [name] 02/19/2015, 10:08 AM

## 2015-02-19 NOTE — Progress Notes (Signed)
Patient ID: Debbie [name], female   DOB: Aug 15, 1950, 65 y.o.   MRN: [id number] S:feels better  O:BP 160/67 mmHg  Pulse 80  Temp(Src) 99.7 F (37.6 C) (Oral)  Resp 22  Ht 5\' 1"  (1.549 m)  Wt 65.8 kg (145 lb 1 oz)  BMI 27.42 kg/m2  SpO2 98%  Intake/Output Summary (Last 24 hours) at 02/19/15 0834 Last data filed at 02/19/15 0400  Gross per 24 hour  Intake    815 ml  Output   1050 ml  Net   -235 ml   Intake/Output: I/O last 3 completed shifts: In: 815 [P.O.:480; Blood:335] Out: 1950 [Urine:1950]  Intake/Output this shift:    Weight change:  Gen:WD WN AAF in  NAD CVS:no rub Resp:cta [id number] Ext:no edema, RUE AVG +T/B   Recent Labs Lab 02/14/15 1837 02/15/15 0117 02/15/15 0527 02/16/15 0524 02/17/15 0326 02/18/15 0327 02/19/15 0428  NA 133* 132* 134* 134* 135 136 137  K 5.3* 5.3* 5.5* 4.0 3.8 3.3* 3.8  CL 102 102 105 104 103 105 102  CO2 19* 18* 19* 19* 21* 23 25  GLUCOSE 105* 108* 97 123* 111* 116* 111*  BUN 46* 46* 45* 49* 50* 49* 51*  CREATININE 3.39* 3.31* 3.41* 3.64* 3.34* 2.92* 2.54*  ALBUMIN 2.9*  --  2.8*  --  2.4* 2.2* 2.4*  CALCIUM 10.0 9.6 9.6 9.3 9.1 8.9 9.1  PHOS  --   --  3.1  --  3.5 3.1 2.4*  AST 18  --  18  --   --   --   --   ALT 10*  --  9*  --   --   --   --    Liver Function Tests:  Recent Labs Lab 02/14/15 1837 02/15/15 0527 02/17/15 0326 02/18/15 0327 02/19/15 0428  AST 18 18  --   --   --   ALT 10* 9*  --   --   --   ALKPHOS 95 86  --   --   --   BILITOT 0.8 0.9  --   --   --   PROT 6.0* 5.8*  --   --   --   ALBUMIN 2.9* 2.8* 2.4* 2.2* 2.4*   No results for input(s): LIPASE, AMYLASE in the last 168 hours. No results for input(s): AMMONIA in the last 168 hours. CBC:  Recent Labs Lab 02/14/15 1837  02/16/15 0524 02/17/15 0326 02/17/15 1245 02/18/15 0327 02/18/15 2059 02/19/15 0428  WBC 6.3  < > 7.6 6.7 6.7 5.9  --  6.2  NEUTROABS 4.7  --   --   --   --   --   --   --   HGB 8.6*  < > 8.7* 7.8* 8.0* 7.7* 9.2* 9.1*   HCT 27.4*  < > 26.8* 24.4* 24.7* 23.5* 29.3* 28.6*  MCV 82.8  < > 84.3 82.7 83.2 83.3  --  83.9  PLT 199  < > 177 188 185 181  --  178  < > = values in this interval not displayed. Cardiac Enzymes:  Recent Labs Lab 02/15/15 0527 02/15/15 1827 02/16/15 0524 02/16/15 1158 02/16/15 1836  TROPONINI <0.03 13.13* 13.48* 9.78* 7.34*   CBG:  Recent Labs Lab 02/15/15 0201  GLUCAP 97    Iron Studies: No results for input(s): IRON, TIBC, TRANSFERRIN, FERRITIN in the last 72 hours. Studies/Results: Dg Ankle Complete Right  02/17/2015  CLINICAL DATA:  Right ankle pain and deformity, fall in hallway January 22, 2015 EXAM: RIGHT ANKLE - COMPLETE 3+ VIEW COMPARISON:  01/23/2015 FINDINGS: There is an oblique fracture of the lateral malleolus. There is transverse fracture of the medial malleolus. There is dislocation of the tibiotalar joint with lateral rotation of the talus. Although no definite posterior malleolar fracture is identified, there is significant artifact along the posterior aspect of the ankle on the lateral view. There is dense atherosclerotic calcification of the small vessels. IMPRESSION: 1. Bimalleolar fracture of the ankle. 2. Tibiotalar dislocation, increased significantly since prior study. Electronically Signed   By: Nolon Nations M.D.   On: 02/17/2015 14:30   Dg Chest Port 1 View  02/18/2015  CLINICAL DATA:  Central line placement EXAM: PORTABLE CHEST 1 VIEW COMPARISON:  02/16/2015 FINDINGS: Cardiomediastinal silhouette is stable. No pulmonary edema. Tiny right pleural effusion. There is left IJ central line with tip in SVC. No pneumothorax. No segmental infiltrate. IMPRESSION: Left IJ central line in place.  No pneumothorax. Electronically Signed   By: Lahoma Crocker M.D.   On: 02/18/2015 14:44   . aspirin EC  81 mg Oral QPM  . atorvastatin  40 mg Oral q1800  . bimatoprost  1 drop Both Eyes QHS  . calcitRIOL  0.25 mcg Oral QODAY  . calcitRIOL  0.5 mcg Oral QODAY  .  carvedilol  6.25 mg Oral BID WC  . darbepoetin (ARANESP) injection - NON-DIALYSIS  60 mcg Subcutaneous Q Wed-1800  . famotidine  10 mg Oral Daily  . furosemide  40 mg Oral Daily  . isosorbide mononitrate  30 mg Oral Daily  . methocarbamol  500 mg Oral BID  . mycophenolate  180 mg Oral BID  . predniSONE  5 mg Oral Q breakfast  . sodium bicarbonate  1,300 mg Oral BID  . sodium chloride  3 mL Intravenous Q12H  . tacrolimus  2 mg Oral BID  . timolol  1 drop Both Eyes BID  . Warfarin - Pharmacist Dosing Inpatient   Does not apply q1800    BMET    Component Value Date/Time   NA 137 02/19/2015 0428   K 3.8 02/19/2015 0428   CL 102 02/19/2015 0428   CO2 25 02/19/2015 0428   GLUCOSE 111* 02/19/2015 0428   BUN 51* 02/19/2015 0428   CREATININE 2.54* 02/19/2015 0428   CALCIUM 9.1 02/19/2015 0428   GFRNONAA 19* 02/19/2015 0428   GFRAA 22* 02/19/2015 0428   CBC    Component Value Date/Time   WBC 6.2 02/19/2015 0428   RBC 3.41* 02/19/2015 0428   RBC 3.17* 02/15/2015 0527   HGB 9.1* 02/19/2015 0428   HCT 28.6* 02/19/2015 0428   PLT 178 02/19/2015 0428   MCV 83.9 02/19/2015 0428   MCH 26.7 02/19/2015 0428   MCHC 31.8 02/19/2015 0428   RDW 16.2* 02/19/2015 0428   LYMPHSABS 1.3 02/14/2015 1837   MONOABS 0.3 02/14/2015 1837   EOSABS 0.0 02/14/2015 1837   BASOSABS 0.0 02/14/2015 1837      Assessment/Plan: 1. AKI/CKD- Pt with underlying chronic allograft dysfunction now with acute CHF requiring IV diuresis and rising troponin, suspicious for ischemia. Continue to follow for now and will need to discuss risks and benefits of cardiac cath with patient and family.  1. Scr improved slightly over last 24 hours 2. Continue with po furosemide 40mg  daily for now 2. SSCP with rising troponin- up to 13. Cardiology appropriately concerned about rising Scr. Will cont to follow and discuss risks/benefits of cardiac cath, however I did tell the  family that she may need to risk CIN if she  develops unstable angina or EKG changes. Appreciate cardiology's thoughtful care. 1. Pt does not want any intervention done at this time and would like to wait 3. Hyperkalemia due to #1. improved after kayexalate. Continue to follow. 4. SOB related to pulmonary edema/CHF- treated with lasix and improved. Continue to follow. Oxygen saturations improved with diuresis and off of BiPap 5. Respiratory distress- as above 6. Anemia of chronic kidney disease- will start ESA, and IV iron (although ferritin is markedly elevated) 1. hgb dropped to 7.8, consider transfusion of 1 unit PRBC's since she has elevated troponins and chest pain. 7. HTN- stable 8. S/p DDKT with chronic allograft dysfunction- continue with immunosuppressive agents and follow renal function. 9. Vascular access- RUE AVG access +T/B 10. CAD with intermittent CP- Cardiology following. 11. 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 So doubtful that chest pain was PE. 12. Severe Protein malnutrition- supplement per nutrition 13. RLE fxs- non-weight-bearing cast in place. Appreciate ortho eval, unfortunately alignment is worse and may require surger.  Nothing further to add.  Will sign off.  Please call with questions or concerns.  Pt is to follow up with Dr. Jimmy Footman in February  [place]

## 2015-02-19 NOTE — Progress Notes (Signed)
ANTICOAGULATION CONSULT NOTE - Follow-Up Pharmacy Consult for Coumadin Indication: atrial fibrillation  Labs:  Recent Labs  02/16/15 1836  02/17/15 0326 02/17/15 1245 02/18/15 0327 02/18/15 2059 02/19/15 0428  HGB  --   < > 7.8* 8.0* 7.7* 9.2* 9.1*  HCT  --   < > 24.4* 24.7* 23.5* 29.3* 28.6*  PLT  --   < > 188 185 181  --  178  LABPROT  --   --  29.3*  --  21.8*  --  20.1*  INR  --   --  2.84*  --  1.91*  --  1.71*  CREATININE  --   --  3.34*  --  2.92*  --  2.54*  TROPONINI 7.34*  --   --   --   --   --   --   < > = values in this interval not displayed.  Estimated Creatinine Clearance: 19.2 mL/min (by C-G formula based on Cr of 2.54).  Assessment: 65 y.o. female admitted with h/o Afib/DVT and noted to have antiphospholipid syndrome, on warfarin PTA. INR on admission supratherapeutic at 3.86. Warfarin was held d/t supratherapeutic level and possible cath. Patient now refusing cath, pharmacy consulted to restart warfarin.   PTA Warfarin: 2.5 mg Mon/Fri, 1.25 mg all other days  INR 1.71, Hgb 9.1, Plt 178, No bleeding reported  Goal of Therapy:  INR 2-3 Monitor platelets by anticoagulation protocol: Yes   Plan:  -Warfarin 2.5mg  tonight -Daily INR/CBC -Monitor for s/sx of bleeding -warfarin education  Governor Specking, PharmD Clinical Pharmacy Resident Pager: [phone]  02/19/2015 2:37 PM

## 2015-02-19 NOTE — Progress Notes (Signed)
Patient ID: Debbie [name], female   DOB: 12/08/1950, 65 y.o.   MRN: [id number] Given her current medical status, will continue to treat her right ankle fracture non-operatively.  Will place her in a new plaster splint today.  She needs to remain non-weight bearing on her right ankle until further notice.  I will need to see her in follow-up in 2 weeks.

## 2015-02-19 NOTE — Evaluation (Signed)
Physical Therapy Evaluation Patient Details Name: Debbie [name] MRN: [id number] DOB: 06-21-1950 Today's Date: 02/19/2015   History of Present Illness  Pt. presented  02/14/15 with nausea and dizziness. Pt. with NSTEMI, acute on chronic combined heart failure, AKI/CKD, h/o SLE, pulmornary edemea and CHF.  Pt. also has recent right ankle fracture which was being managed with NWB and no surgery up to this point, however alignment is worse now and may require OR per ortho.   Pt. reports difficulty with vision due to glaucoma  Clinical Impression  Pt. Presents to PT with above diagnoses and decreased functional mobility/gait  due especially to NWB status.  Pt. Has had worsening of her alignment in R ankle fx. And she endorses not being able to comply fully with NWB status.  She will benefit from acute PT to address her mobility issues and will likely need SNF is she remains NWB for adequate support to allow healing.     Follow Up Recommendations SNF;Supervision/Assistance - 24 hour    Equipment Recommendations  Other (comment) (TBD once DC dispo confirmed)    Recommendations for Other Services       Precautions / Restrictions Precautions Precautions: Fall Required Braces or Orthoses:  (splint reapplied to right ankle 02/19/16) Restrictions Weight Bearing Restrictions: Yes RLE Weight Bearing: Non weight bearing      Mobility  Bed Mobility Overal bed mobility: Needs Assistance Bed Mobility: Supine to Sit     Supine to sit: Min assist     General bed mobility comments: min assist to come to long sit in bed with use of bedrails  Transfers Overall transfer level: Needs assistance Equipment used: Rolling walker (2 wheeled);None Transfers: Sit to/from [employer] Sit to Stand: +2 physical assistance;Max assist   Squat pivot transfers: +2 physical assistance;Max assist     General transfer comment: Attempted to have pt. sit>stand with RW +2 max assist, however pt.  unable to fully stand with NWB R LE.  Pt. assisted back to sitting at EOB, then assisted in squat pivot transfer from bed to recliner with +2 max assist and support for RLE to assure NWB  Ambulation/Gait Ambulation/Gait assistance:  (pt. unable)              Stairs            Wheelchair Mobility    Modified Rankin (Stroke Patients Only)       Balance Overall balance assessment: History of Falls;Needs assistance Sitting-balance support: No upper extremity supported;Feet supported Sitting balance-Leahy Scale: Fair Sitting balance - Comments: needed supervision for safety                                     Pertinent Vitals/Pain Pain Assessment: No/denies pain    Home Living Family/patient expects to be discharged to:: Unsure (no family present; pt. unsure)                      Prior Function Level of Independence: Needs assistance   Gait / Transfers Assistance Needed: pt. reports moving from wheelchair to lift chair and commode PTA since ankle fracture.  Pt. endorses that she has been unable to maintain NWB R LE at home           Hand Dominance        Extremity/Trunk Assessment   Upper Extremity Assessment: Generalized weakness  Lower Extremity Assessment: Generalized weakness      Cervical / Trunk Assessment: Normal  Communication   Communication: No difficulties  Cognition Arousal/Alertness: Awake/alert Behavior During Therapy: WFL for tasks assessed/performed Overall Cognitive Status: Within Functional Limits for tasks assessed                      General Comments General comments (skin integrity, edema, etc.): splint intact right LE, pt. able to wiggle toes, skin warm to touch    Exercises        Assessment/Plan    PT Assessment Patient needs continued PT services  PT Diagnosis Difficulty walking;Generalized weakness   PT Problem List Decreased strength;Decreased activity  tolerance;Decreased balance;Decreased mobility;Decreased knowledge of use of DME  PT Treatment Interventions DME instruction;Gait training;Functional mobility training;Therapeutic exercise;Therapeutic activities;Balance training;Patient/family education   PT Goals (Current goals can be found in the Care Plan section) Acute Rehab PT Goals Patient Stated Goal: pt. did not provide goals PT Goal Formulation: With patient Time For Goal Achievement: 02/26/15 Potential to Achieve Goals: Fair    Frequency Min 3X/week   Barriers to discharge Decreased caregiver support pt. unlikely to have had adequate support for maintaining NWB prior to this admission and was unable to maintain compliance in NWB on her own    Co-evaluation               End of Session Equipment Utilized During Treatment: Gait belt Activity Tolerance: Patient tolerated treatment well Patient left: in chair;with call bell/phone within reach Nurse Communication: Mobility status (RN present for instruction in how to assist pt. back to bed)         Time: [id number] PT Time Calculation (min) (ACUTE ONLY): 21 min   Charges:   PT Evaluation $PT Eval Moderate Complexity: 1 Procedure     PT G CodesLadona Ridgel 02/19/2015, 11:54 AM Gerlean Ren PT Acute Rehab Services [phone]

## 2015-02-19 NOTE — Progress Notes (Addendum)
       Patient Name: Debbie [name] Date of Encounter: 02/19/2015    SUBJECTIVE: Feels better. Breathing is improved. He denies chest pain. No palpitations. Does not want coronary angiography because of concern about kidney function. Appetite improved.  TELEMETRY:  Normal sinus rhythm Filed Vitals:   02/19/15 0000 02/19/15 0400 02/19/15 0727 02/19/15 0741  BP: 157/61 167/67 160/67   Pulse: 75 77 79 80  Temp: 98.1 F (36.7 C) 98.9 F (37.2 C) 99.7 F (37.6 C)   TempSrc: Oral Oral Oral   Resp: 21 21 22    Height:      Weight:      SpO2: 100% 100% 98%     Intake/Output Summary (Last 24 hours) at 02/19/15 1047 Last data filed at 02/19/15 0908  Gross per 24 hour  Intake   1055 ml  Output   1050 ml  Net      5 ml   LABS: Basic Metabolic Panel:  Recent Labs  02/18/15 0327 02/19/15 0428  NA 136 137  K 3.3* 3.8  CL 105 102  CO2 23 25  GLUCOSE 116* 111*  BUN 49* 51*  CREATININE 2.92* 2.54*  CALCIUM 8.9 9.1  PHOS 3.1 2.4*   CBC:  Recent Labs  02/18/15 0327 02/18/15 2059 02/19/15 0428  WBC 5.9  --  6.2  HGB 7.7* 9.2* 9.1*  HCT 23.5* 29.3* 28.6*  MCV 83.3  --  83.9  PLT 181  --  178   Cardiac Enzymes:  Recent Labs  02/16/15 1158 02/16/15 1836  TROPONINI 9.78* 7.34*    Radiology/Studies:  Chest x-ray for placement of IJ reveals improved lung aeration. Admitting x-ray revealed pulmonary edema.  Physical Exam: Blood pressure 160/67, pulse 80, temperature 99.7 F (37.6 C), temperature source Oral, resp. rate 22, height 5\' 1"  (1.549 m), weight 145 lb 1 oz (65.8 kg), SpO2 98 %. Weight change:   Wt Readings from Last 3 Encounters:  02/15/15 145 lb 1 oz (65.8 kg)  01/04/15 150 lb (68.04 kg)  12/21/14 150 lb 1 oz (68.068 kg)   Clear lung fields. Decreased breath sounds at the basis. No obvious JVD with the patient sitting Neuro intact. Appropriate conversation. No peripheral edema  ASSESSMENT:  1. Acute on chronic combined systolic and diastolic  heart failure with significant improvement since admission. 2. Status post kidney transplant, chronic kidney disease stage III/IV 3. Hemoglobin improved after transfusion 4. Non-ST elevation myocardial infarction, with plan to treat medically. Guarded prognosis. 5. No recent instances of PAF  Plan:  1. Diuresis as tolerated to maintain adequate volume 2. Medical therapy for non-ST elevation myocardial infarction: Current therapy includes long-acting nitrates, aspirin, statin therapy, beta blocker therapy, diuretic therapy. Would be nice to use dual antiplatelet therapy but this would not be possible given chronic Coumadin therapy and tendency towards severe anemia. 3. Overall prognosis is concerning.  Demetrios Isaacs 02/19/2015, 10:47 AM

## 2015-02-20 LAB — RENAL FUNCTION PANEL
ANION GAP: 12 (ref 5–15)
Albumin: 2.2 g/dL — ABNORMAL LOW (ref 3.5–5.0)
BUN: 50 mg/dL — ABNORMAL HIGH (ref 6–20)
CALCIUM: 9.3 mg/dL (ref 8.9–10.3)
CO2: 27 mmol/L (ref 22–32)
CREATININE: 2.43 mg/dL — AB (ref 0.44–1.00)
Chloride: 100 mmol/L — ABNORMAL LOW (ref 101–111)
GFR, EST AFRICAN AMERICAN: 23 mL/min — AB (ref >60)
GFR, EST NON AFRICAN AMERICAN: 20 mL/min — AB (ref >60)
Glucose, Bld: 121 mg/dL — ABNORMAL HIGH (ref 65–99)
PHOSPHORUS: 2.7 mg/dL (ref 2.5–4.6)
Potassium: 3.5 mmol/L (ref 3.5–5.1)
Sodium: 139 mmol/L (ref 135–145)

## 2015-02-20 LAB — CBC
HEMATOCRIT: 27.9 % — AB (ref 36.0–46.0)
HEMOGLOBIN: 8.9 g/dL — AB (ref 12.0–15.0)
MCH: 26.9 pg (ref 26.0–34.0)
MCHC: 31.9 g/dL (ref 30.0–36.0)
MCV: 84.3 fL (ref 78.0–100.0)
Platelets: 145 10*3/uL — ABNORMAL LOW (ref 150–400)
RBC: 3.31 MIL/uL — AB (ref 3.87–5.11)
RDW: 16.2 % — ABNORMAL HIGH (ref 11.5–15.5)
WBC: 5.9 10*3/uL (ref 4.0–10.5)

## 2015-02-20 LAB — PROTIME-INR
INR: 1.69 — ABNORMAL HIGH (ref 0.00–1.49)
PROTHROMBIN TIME: 19.8 s — AB (ref 11.6–15.2)

## 2015-02-20 MED ORDER — WARFARIN SODIUM 4 MG PO TABS
4.0000 mg | ORAL_TABLET | Freq: Once | ORAL | Status: AC
  Administered 2015-02-20: 4 mg via ORAL
  Filled 2015-02-20: qty 1

## 2015-02-20 MED ORDER — ISOSORBIDE MONONITRATE ER 60 MG PO TB24
60.0000 mg | ORAL_TABLET | Freq: Every day | ORAL | Status: DC
  Administered 2015-02-20 – 2015-02-21 (×2): 60 mg via ORAL
  Filled 2015-02-20 (×2): qty 1

## 2015-02-20 NOTE — Clinical Social Work Note (Signed)
Clinical Social Work Assessment  Patient Details  Name: Debbie [name] MRN: [id number] Date of Birth: Apr 21, 1950  Date of referral:  02/20/15               Reason for consult:  Discharge Planning, Facility Placement                Permission sought to share information with:  Facility Sport and exercise psychologist, Family Supports Permission granted to share information::  Yes, Verbal Permission Granted  Name::        Agency::     Relationship::     Contact Information:     Housing/Transportation Living arrangements for the past 2 months:  Single Family Home Source of Information:  Patient Patient Interpreter Needed:  None Criminal Activity/Legal Involvement Pertinent to Current Situation/Hospitalization:  No - Comment as needed Significant Relationships:  Adult Children, Siblings Lives with:  Self Do you feel safe going back to the place where you live?  Yes Need for family participation in patient care:  Yes (Comment)  Care giving concerns:  Patient and family are wanting the patient to return home with HHPT.   Social Worker assessment / plan:  CSW met with patient, sister, and son at bedside to complete assessment. Patient and patient's son both state that the plan will be for the patient to return home with family and receive HHPT in the home. The patient and son confirm that someone will be with the patient 24/7 to provide assistance. CSW encouraged patient and family to consider SNF as this is the recommendation for the patient. Both family and patient continue to insist that the patient will return home at time of discharge. Charge RN updated. CSW signing off.  Employment status:  Retired Forensic scientist:  Medicare PT Recommendations:  [place] / Referral to community resources:  [place]  Patient/Family's Response to care:  Patient and family appear to be happy with the care the patient has received.  Patient/Family's  Understanding of and Emotional Response to Diagnosis, Current Treatment, and Prognosis:  Patient and family appear to have good insight into reason for admission. Both appear optimistic about the patient's ability to return home. Family made aware of the level assistance the patient is requiring.   Emotional Assessment Appearance:  Appears stated age Attitude/Demeanor/Rapport:  Other (Patient was appropriate.) Affect (typically observed):  Appropriate, Calm Orientation:  Oriented to Self, Oriented to Place, Oriented to  Time, Oriented to Situation Alcohol / Substance use:  Alcohol Use, Tobacco Use Psych involvement (Current and /or in the community):  No (Comment)  Discharge Needs  Concerns to be addressed:  Discharge Planning Concerns Readmission within the last 30 days:  No Current discharge risk:  Chronically ill, Physical Impairment Barriers to Discharge:  Continued Medical Work up   [employer] MSW, [place], [place], [phone]

## 2015-02-20 NOTE — Progress Notes (Signed)
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 Jacques Navy appointment 02/16/15) presented to [facility] on 02/14/15 with complaints of dizziness and nausea. She then developed chest pain and dyspnea while in the ED. Subsequently developed worsening dyspnea and was transferred to stepdown unit for BiPAP. Briefly on NTG drip. Cardiology consulted. Patient does not wish to pursue aggressive cardiac intervention i.e.cath due to concern for worsening renal insufficiency and landing upon HD. Nephrology consulted-AKI improved and nephrology signed off. Orthopedics plan nonoperative management of right leg fractures due to high risk for surgery. PT recommends SNF but patient contemplating home versus SNF. Infrequent episodes of angina-cardiology have adjusted Imdur.   Assessment/Plan:   1. Acute on stage III chronic kidney disease inpatient with history of renal transplant 2010 and some allograft nephropathy: Nephrology consultation appreciated. Baseline creatinine 2.2-2.7. Admitted with creatinine of 3.39. Briefly hydrated with IV fluids which had to be discontinued secondary to CHF. Creatinine improving 3.64 > 3.34 >2.92> 2.54>2.43. Nephrology follow-up appreciated-signed off 1/28. Outpatient follow-up with Dr. Jimmy Footman in Feb 2. Hyperkalemia: Secondary to acute kidney  injury. Status post Kayexalate. Resolved. 3. Hypokalemia: replaced 4. NSTEMI/new cardiomyopathy: Cardiology follow-up appreciated. Continue beta blockers, statins and low-dose aspirin. Troponin peaked: 13.48. 2-D echo: LVEF 40-45 percent with wall motion abnormality in distal anteroseptal region. Cardiology have discussed extensively with patient/family and are recommending continued aggressive medical management and consider aggressive evaluation i.e. cardiac catheterization if her creatinine returns to baseline as it was 5-6 months ago or if she develops worsening anginal symptoms or dyspnea. Coumadin will have to be held prior to procedure. Patient at this time does not wish to proceed with. NTG stopped. S/P 1 unit PRBC on 1/27 and hemoglobin has improved to >9 g. Added Imdur > increased dose d/t infrequent CP/Angina. 5. Acute systolic CHF: Lasix changed from IV to oral 40 mg daily. Compensated. 6. Paroxysmal A. fib: Currently in sinus rhythm. Continue beta blockers and Coumadin per pharmacy. INR 1.69. 7. Acute respiratory failure with hypoxia: Secondary to decompensated CHF. Resolved. 8. Anemia of chronic kidney disease: Hemoglobin was stable in the 8 g range until dropped to 7.8>8>7.7 on 1/27. S/P 1 unit of PRBC given NSTEMI & hemoglobin has improved to 9.1 g per DL. Follow CBCs. Stable 9. Essential hypertension: controlled. 10. Status post renal transplant with chronic allograft nephropathy: Continue immunosuppressants. 11. SLE/positive anticardiolipin antibodies/history of DVT/PE: On chronic Coumadin at home. INR subtherapeutic. Coumadin per pharmacy. CT chest without contrast done 1/24. 12. Right fibular and medial malleolus fracture: Dr. Ninfa Linden, orthopedic follow-up appreciated >x-ray shows that her fracture alignment actually looks worse and he recommends strict nonweightbearing status on right lower extremity. As per cardiology, patient would be high risk for surgery with a recent NSTEMI.  Orthopedics is seen today and given her current medical status, recommend treating her right ankle fracture nonoperatively. She will be placed on new plaster splint today. Outpatient follow-up with Dr. Jean Rosenthal in 2 weeks. 13. Asymptomatic Bacteriuria: Urine culture shows >100 K diphtheroids.  Patient denies symptoms suggestive of UTI.    DVT prophylaxis: INR sub therapeutic on Coumadin Code Status: Full Family Communication: None at bedside today. Disposition Plan: PT recommends SNF but patient contemplating home versus SNF.   Consultants:  Cardiology  Nephrology-signed off 1/28  CCM-consulted 1/27 for placement of central line.  Procedures:  Foley catheter-DC'd 1/26  Left IJ central line 1/27-placed due to lack of good IV access  Antimicrobials:  None   Subjective: Brief episode of mild "7 minute" midsternal chest pain this morning that resolved spontaneously.  Objective: Filed Vitals:   02/19/15 1535 02/19/15 1631 02/19/15 2137 02/20/15 0523  BP: 129/89 137/67 127/59 145/65  Pulse: 83 78 68 74  Temp: 97.6 F (36.4 C) 97.9 F (36.6 C) 97.7 F (36.5 C) 98 F (36.7 C)  TempSrc: Oral Oral Axillary Oral  Resp: 20 18 16 17   Height:  5\' 1"  (1.549 m)    Weight:  66.361 kg (146 lb 4.8 oz)  64.7 kg (142 lb 10.2 oz)  SpO2: 97% 98% 98% 99%   No intake or output data in the 24 hours ending 02/20/15 1146 Filed Weights   02/15/15 0203 02/19/15 1631 02/20/15 0523  Weight: 65.8 kg (145 lb 1 oz) 66.361 kg (146 lb 4.8 oz) 64.7 kg (142 lb 10.2 oz)    Exam:  General exam: Pleasant middle-aged female lying comfortably propped up in bed. Respiratory system: clear to auscultation. No increased work of breathing. Cardiovascular system: S1 & S2 heard, RRR. No JVD, murmurs, gallops, clicks or pedal edema. Telemetry: Sinus rhythm. Gastrointestinal system: Abdomen is nondistended, soft and nontender. Normal bowel sounds heard.  Central nervous system: Alert and oriented.  No focal neurological deficits. Extremities: Symmetric 5 x 5 power. RLE has new splint. Left upper arm with functioning AV fistula with thrill.   Data Reviewed: Basic Metabolic Panel:  Recent Labs Lab 02/15/15 0527 02/16/15 0524 02/17/15 0326 02/18/15 0327 02/19/15 0428 02/20/15 0535  NA 134* 134* 135 136 137 139  K 5.5* 4.0 3.8 3.3* 3.8 3.5  CL 105 104 103 105 102 100*  CO2 19* 19* 21* 23 25 27   GLUCOSE 97 123* 111* 116* 111* 121*  BUN 45* 49* 50* 49* 51* 50*  CREATININE 3.41* 3.64* 3.34* 2.92* 2.54* 2.43*  CALCIUM 9.6 9.3 9.1 8.9 9.1 9.3  MG 1.8  --   --   --   --   --   PHOS 3.1  --  3.5 3.1 2.4* 2.7   Liver Function Tests:  Recent Labs Lab 02/14/15 1837 02/15/15 0527 02/17/15 0326 02/18/15 0327 02/19/15 0428 02/20/15 0535  AST 18 18  --   --   --   --   ALT 10* 9*  --   --   --   --   ALKPHOS 95 86  --   --   --   --   BILITOT 0.8 0.9  --   --   --   --   PROT 6.0* 5.8*  --   --   --   --   ALBUMIN 2.9* 2.8* 2.4* 2.2* 2.4* 2.2*   No results for input(s): LIPASE, AMYLASE in the last 168 hours. No results for input(s): AMMONIA in the last 168 hours. CBC:  Recent Labs Lab 02/14/15 1837  02/17/15 0326 02/17/15 1245 02/18/15 0327 02/18/15 2059 02/19/15 0428 02/20/15 0535  WBC 6.3  < > 6.7 6.7 5.9  --  6.2 5.9  NEUTROABS 4.7  --   --   --   --   --   --   --  HGB 8.6*  < > 7.8* 8.0* 7.7* 9.2* 9.1* 8.9*  HCT 27.4*  < > 24.4* 24.7* 23.5* 29.3* 28.6* 27.9*  MCV 82.8  < > 82.7 83.2 83.3  --  83.9 84.3  PLT 199  < > 188 185 181  --  178 145*  < > = values in this interval not displayed. Cardiac Enzymes:  Recent Labs Lab 02/15/15 0527 02/15/15 1827 02/16/15 0524 02/16/15 1158 02/16/15 1836  TROPONINI <0.03 13.13* 13.48* 9.78* 7.34*   BNP (last 3 results) No results for input(s): PROBNP in the last 8760 hours. CBG:  Recent Labs Lab 02/15/15 0201  GLUCAP 97    Recent Results (from the past 240 hour(s))  MRSA PCR Screening     Status: None    Collection Time: 02/15/15 10:46 AM  Result Value Ref Range Status   MRSA by PCR NEGATIVE NEGATIVE Final    Comment:        The GeneXpert MRSA Assay (FDA approved for NASAL specimens only), is one component of a comprehensive MRSA colonization surveillance program. It is not intended to diagnose MRSA infection nor to guide or monitor treatment for MRSA infections.   Culture, Urine     Status: None   Collection Time: 02/15/15  1:52 PM  Result Value Ref Range Status   Specimen Description URINE, CATHETERIZED  Final   Special Requests Normal  Final   Culture   Final    >=100,000 COLONIES/mL DIPHTHEROIDS(CORYNEBACTERIUM SPECIES) Standardized susceptibility testing for this organism is not available.    Report Status 02/16/2015 FINAL  Final         Studies: Dg Chest Port 1 View  02/18/2015  CLINICAL DATA:  Central line placement EXAM: PORTABLE CHEST 1 VIEW COMPARISON:  02/16/2015 FINDINGS: Cardiomediastinal silhouette is stable. No pulmonary edema. Tiny right pleural effusion. There is left IJ central line with tip in SVC. No pneumothorax. No segmental infiltrate. IMPRESSION: Left IJ central line in place.  No pneumothorax. Electronically Signed   By: Lahoma Crocker M.D.   On: 02/18/2015 14:44        Scheduled Meds: . aspirin EC  81 mg Oral QPM  . atorvastatin  40 mg Oral q1800  . bimatoprost  1 drop Both Eyes QHS  . calcitRIOL  0.25 mcg Oral QODAY  . calcitRIOL  0.5 mcg Oral QODAY  . carvedilol  6.25 mg Oral BID WC  . darbepoetin (ARANESP) injection - NON-DIALYSIS  60 mcg Subcutaneous Q Wed-1800  . famotidine  10 mg Oral Daily  . furosemide  40 mg Oral Daily  . isosorbide mononitrate  60 mg Oral Daily  . methocarbamol  500 mg Oral BID  . mycophenolate  180 mg Oral BID  . predniSONE  5 mg Oral Q breakfast  . sodium bicarbonate  1,300 mg Oral BID  . sodium chloride flush  10-40 mL Intracatheter Q12H  . tacrolimus  2 mg Oral BID  . timolol  1 drop Both Eyes BID  .  warfarin  4 mg Oral ONCE-1800  . Warfarin - Pharmacist Dosing Inpatient   Does not apply q1800   Continuous Infusions: . sodium chloride Stopped (02/17/15 1054)    Active Problems:   Kidney transplant as cause of abnormal reaction or later complication   PAF (paroxysmal atrial fibrillation) (HCC)   HTN (hypertension)   Chronic anticoagulation - with Coumadin   AKI (acute kidney injury) ([place])   Acute renal failure superimposed on stage 3 chronic kidney disease ([place])   Dehydration  Chest pain   Acute on chronic renal failure (HCC)   Acute systolic heart failure (HCC)   Hyperkalemia   NSTEMI (non-ST elevated myocardial infarction) (HCC)   Anemia in CKD (chronic kidney disease)   Hypokalemia    Time spent: 15 minutes.    Vernell Leep, MD, FACP, FHM. [facility] Pager [phone]  If 7PM-7AM, please contact night-coverage [email or web address] Password [email or web address] 02/20/2015, 11:46 AM    LOS: 6 days

## 2015-02-20 NOTE — Progress Notes (Signed)
       Patient Name: Debbie [name] Date of Encounter: 02/20/2015    SUBJECTIVE: The patient had a mild, less than 5 minute episode of chest discomfort this morning. She has 4 assistance from the nursing staff but no response. She feels well now. She denies dyspnea. She is lying flat without discomfort.  TELEMETRY:  Normal sinus rhythm Filed Vitals:   02/19/15 1535 02/19/15 1631 02/19/15 2137 02/20/15 0523  BP: 129/89 137/67 127/59 145/65  Pulse: 83 78 68 74  Temp: 97.6 F (36.4 C) 97.9 F (36.6 C) 97.7 F (36.5 C) 98 F (36.7 C)  TempSrc: Oral Oral Axillary Oral  Resp: 20 18 16 17   Height:  5\' 1"  (1.549 m)    Weight:  146 lb 4.8 oz (66.361 kg)  142 lb 10.2 oz (64.7 kg)  SpO2: 97% 98% 98% 99%    Intake/Output Summary (Last 24 hours) at 02/20/15 0754 Last data filed at 02/19/15 1136  Gross per 24 hour  Intake    240 ml  Output    200 ml  Net     40 ml   LABS: Basic Metabolic Panel:  Recent Labs  02/19/15 0428 02/20/15 0535  NA 137 139  K 3.8 3.5  CL 102 100*  CO2 25 27  GLUCOSE 111* 121*  BUN 51* 50*  CREATININE 2.54* 2.43*  CALCIUM 9.1 9.3  PHOS 2.4* 2.7   CBC:  Recent Labs  02/19/15 0428 02/20/15 0535  WBC 6.2 5.9  HGB 9.1* 8.9*  HCT 28.6* 27.9*  MCV 83.9 84.3  PLT 178 145*     Radiology/Studies:  No new data  Physical Exam: Blood pressure 145/65, pulse 74, temperature 98 F (36.7 C), temperature source Oral, resp. rate 17, height 5\' 1"  (1.549 m), weight 142 lb 10.2 oz (64.7 kg), SpO2 99 %. Weight change:   Wt Readings from Last 3 Encounters:  02/20/15 142 lb 10.2 oz (64.7 kg)  01/04/15 150 lb (68.04 kg)  12/21/14 150 lb 1 oz (68.068 kg)   Neck veins are relatively flat. Still has internal jugular line. Chest is clear Cardiac exam reveals no gallop or rub. Cardiac reveals a soft systolic murmur right upper sternal border. No diastolic murmur  ASSESSMENT:  1. Acute on chronic combined systolic and diastolic heart failure, clinically  improved with diuresis 2. Coronary artery disease, non-ST elevation myocardial infarction, and recurrent angina post infarct. We are  embarking upon a medical regimen course unless symptoms forced coronary angiography. Currently the patient refuses angiography because of concern of "transplanted kidney" failure.   3. Severe anemia, hemoglobin stable around 9  Plan:  1. Antianginal therapy as tolerated. I will increase isosorbide to 60 mg daily. Demetrios Isaacs 02/20/2015, 7:54 AM

## 2015-02-20 NOTE — Progress Notes (Signed)
ANTICOAGULATION CONSULT NOTE - Follow-Up Pharmacy Consult for Coumadin Indication: atrial fibrillation  Labs:  Recent Labs  02/18/15 0327 02/18/15 2059 02/19/15 0428 02/20/15 0535  HGB 7.7* 9.2* 9.1* 8.9*  HCT 23.5* 29.3* 28.6* 27.9*  PLT 181  --  178 145*  LABPROT 21.8*  --  20.1* 19.8*  INR 1.91*  --  1.71* 1.69*  CREATININE 2.92*  --  2.54* 2.43*    Estimated Creatinine Clearance: 19.9 mL/min (by C-G formula based on Cr of 2.43).  Assessment: 65 y.o. female with h/o Afib/DVT and antiphospholipid syndrome, on warfarin PTA. INR on admission supratherapeutic at 3.86. Warfarin was held d/t supratherapeutic level and possible cath. Patient now refusing cath, coumadin was restarted on 1/27. INR down to 1.69 today, hgb low stable plt 145K.   PTA Warfarin: 2.5 mg Mon/Fri, 1.25 mg all other days, last dose 1/22  Goal of Therapy:  INR 2-3 Monitor platelets by anticoagulation protocol: Yes   Plan:  -Warfarin 4 mg tonight -Daily INR/CBC -Monitor for s/sx of bleeding -warfarin education  Maryanna Shape, PharmD, BCPS  Clinical Pharmacist  Pager: [phone]   02/20/2015 9:59 AM

## 2015-02-21 LAB — RENAL FUNCTION PANEL
ALBUMIN: 2.5 g/dL — AB (ref 3.5–5.0)
Anion gap: 6 (ref 5–15)
BUN: 46 mg/dL — AB (ref 6–20)
CALCIUM: 9.3 mg/dL (ref 8.9–10.3)
CO2: 27 mmol/L (ref 22–32)
CREATININE: 2.2 mg/dL — AB (ref 0.44–1.00)
Chloride: 105 mmol/L (ref 101–111)
GFR calc Af Amer: 26 mL/min — ABNORMAL LOW (ref >60)
GFR calc non Af Amer: 22 mL/min — ABNORMAL LOW (ref >60)
GLUCOSE: 110 mg/dL — AB (ref 65–99)
PHOSPHORUS: 2.8 mg/dL (ref 2.5–4.6)
Potassium: 3.9 mmol/L (ref 3.5–5.1)
SODIUM: 138 mmol/L (ref 135–145)

## 2015-02-21 LAB — CBC
HEMATOCRIT: 29.2 % — AB (ref 36.0–46.0)
HEMOGLOBIN: 9.4 g/dL — AB (ref 12.0–15.0)
MCH: 27.2 pg (ref 26.0–34.0)
MCHC: 32.2 g/dL (ref 30.0–36.0)
MCV: 84.4 fL (ref 78.0–100.0)
Platelets: 152 10*3/uL (ref 150–400)
RBC: 3.46 MIL/uL — AB (ref 3.87–5.11)
RDW: 16.2 % — AB (ref 11.5–15.5)
WBC: 6.4 10*3/uL (ref 4.0–10.5)

## 2015-02-21 LAB — PROTIME-INR
INR: 1.94 — AB (ref 0.00–1.49)
PROTHROMBIN TIME: 22.1 s — AB (ref 11.6–15.2)

## 2015-02-21 MED ORDER — WARFARIN SODIUM 2.5 MG PO TABS: 1.2500 mg | ORAL_TABLET | ORAL | Status: DC

## 2015-02-21 MED ORDER — ISOSORBIDE MONONITRATE ER 60 MG PO TB24: 60.0000 mg | ORAL_TABLET | Freq: Every day | ORAL | Status: AC

## 2015-02-21 MED ORDER — OXYCODONE-ACETAMINOPHEN 5-325 MG PO TABS: 1.0000 | ORAL_TABLET | Freq: Four times a day (QID) | ORAL | Status: DC | PRN

## 2015-02-21 MED ORDER — ATORVASTATIN CALCIUM 40 MG PO TABS: 40.0000 mg | ORAL_TABLET | Freq: Every day | ORAL | Status: AC

## 2015-02-21 MED ORDER — WARFARIN SODIUM 2.5 MG PO TABS: 2.5000 mg | ORAL_TABLET | ORAL | Status: DC

## 2015-02-21 MED ORDER — WARFARIN SODIUM 2.5 MG PO TABS: 1.2500 mg | ORAL_TABLET | Freq: Every day | ORAL | Status: DC

## 2015-02-21 MED ORDER — ALLOPURINOL 100 MG PO TABS: 200.0000 mg | ORAL_TABLET | Freq: Every day | ORAL | Status: DC

## 2015-02-21 MED ORDER — ACETAMINOPHEN 325 MG PO TABS: 650.0000 mg | ORAL_TABLET | Freq: Four times a day (QID) | ORAL | Status: AC | PRN

## 2015-02-21 MED ORDER — FUROSEMIDE 40 MG PO TABS: 40.0000 mg | ORAL_TABLET | Freq: Every day | ORAL | Status: DC

## 2015-02-21 MED ORDER — CARVEDILOL 6.25 MG PO TABS: 6.2500 mg | ORAL_TABLET | Freq: Two times a day (BID) | ORAL | Status: AC

## 2015-02-21 NOTE — Consult Note (Signed)
   [facility] CM Inpatient Consult   02/21/2015  Debbie [name] 07-Feb-1950 [id number] Patient was assessed for [place] Management for community services to restart. Patient was previously active with [place] Management through her Medicare.  Met with patient at bedside regarding being restarted with [facility] services. Consent form active in file.  Of note, [facility] Care Management services does not replace or interfere with any services that are arranged by inpatient case management or social work. For additional questions or referrals please contact: Natividad Brood, RN BSN [facility] Liaison  [phone] business mobile phone Toll free office [phone]

## 2015-02-21 NOTE — Care Management Note (Signed)
Case Management Note  Patient Details  Name: Debbie [name] MRN: [id number] Date of Birth: [birth year]  Subjective/Objective:   Pt admitted for Dizziness and nausea. Pt is from home with support of family members. CM did speak with pt ad family in regards to home needs. Choice offered to pt and pt chose [facility] for services. Family states someone is always with the patient.                  Action/Plan: CM did make referral for [place]. SOC to begin within 24-48 hrs of d/c. Ambulance transport for home being set up via CSW. No further needs from CM at this time.    Expected Discharge Date:                  Expected Discharge Plan:  [place]  In-House Referral:  Clinical Social Work  Discharge planning Services  CM Consult  Post Acute Care Choice:  Durable Medical Equipment Choice offered to:  Patient  DME Arranged:  [place] rolling DME Agency:  [place] Arranged:  RN, PT [facility] Agency:  [place]  Status of Service:  Completed, signed off  Medicare Important Message Given:  Yes Date Medicare IM Given:    Medicare IM give by:    Date Additional Medicare IM Given:    Additional Medicare Important Message give by:     If discussed at [place] of Stay Meetings, dates discussed:    Additional Comments:  Bethena Roys, RN 02/21/2015, 10:43 AM

## 2015-02-21 NOTE — Discharge Summary (Signed)
Physician Discharge Summary  Debbie [name] [id number] DOB: [birth year] DOA: 02/14/2015  PCP: No PCP Per Patient  Admit date: 02/14/2015 Discharge date: 02/21/2015  Time spent: Greater than 30 minutes  Recommendations for Outpatient Follow-up:  1. Dr. Jean Rosenthal, orthopedics in 2 weeks 2. Crista Curb, cardiology PA-C on 02/28/15 at 9:30 AM 3. Dr. Mauricia Area, Nephrology: MDs office will arrange follow-up appointment in 1 week. 4. Home health PT, RN and rollator. RN to draw repeat labs (CBC, PT, INR & renal panel) on 02/24/15 and forward results to Dr. Jimmy Footman for Coumadin dose adjustment. 5. Recommend repeating CT chest without contrast in 3 months to reassess for resolution of nodular infiltrates seen in the lung on CT chest 02/16/15.  Discharge Diagnoses:  Active Problems:   Kidney transplant as cause of abnormal reaction or later complication   PAF (paroxysmal atrial fibrillation) (HCC)   HTN (hypertension)   Chronic anticoagulation - with Coumadin   AKI (acute kidney injury) ([place])   Acute renal failure superimposed on stage 3 chronic kidney disease (HCC)   Dehydration   Chest pain   Acute on chronic renal failure (HCC)   Acute systolic heart failure (HCC)   Hyperkalemia   NSTEMI (non-ST elevated myocardial infarction) ([place])   Anemia in CKD (chronic kidney disease)   Hypokalemia   Discharge Condition: Improved & Stable  Diet recommendation: Heart healthy diet.   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 Jacques Navy appointment 02/16/15) presented to [facility] on 02/14/15 with complaints of dizziness and nausea. She then developed chest pain and dyspnea while in the ED. Subsequently developed worsening dyspnea and was transferred to stepdown unit for BiPAP. Briefly on NTG drip. Cardiology consulted. Patient does not wish to pursue aggressive cardiac intervention i.e.cath due to concern for worsening renal insufficiency and landing upon HD. Nephrology consulted-AKI improved and nephrology signed off. Orthopedics plan nonoperative management of right leg fractures due to high risk for surgery. PT recommends SNF but patient and family declined and plan to return home.  Hospital Course:   1. Acute on stage III chronic kidney disease in a patient with history of renal transplant 2010 and some allograft nephropathy: Nephrology consultation appreciated. Baseline creatinine 2.2-2.7. Admitted with creatinine of 3.39. Briefly hydrated with IV fluids which had to be discontinued secondary to CHF. Creatinine improving 3.64 > 3.34 >2.92> 2.54>2.43>2.2. Nephrology follow-up appreciated-signed off 1/28. Outpatient follow-up with Dr. Jimmy Footman in Feb. requested home health RN to draw labs on 02/24/15 and forward to nephrologist. 2. Hyperkalemia: Secondary to acute kidney injury. Status post Kayexalate. Resolved. 3. Hypokalemia: replaced 4. NSTEMI/new cardiomyopathy: Cardiology follow-up appreciated. Continue beta blockers, statins and low-dose aspirin. Troponin peaked: 13.48. 2-D echo: LVEF 40-45 percent with wall motion abnormality in distal anteroseptal region. Cardiology have discussed extensively with patient/family and are recommending continued aggressive medical management and  consider aggressive evaluation i.e. cardiac catheterization if her  creatinine returns to baseline as it was 5-6 months ago or if she develops worsening anginal symptoms or dyspnea. Coumadin will have to be held prior to procedure. Patient at this time does not wish to proceed with cath. NTG stopped. S/P 1 unit PRBC on 1/27 and hemoglobin has improved to >9 g. Added Imdur > increased dose d/t infrequent CP/Angina. Patient was on Ranexa PTA which will be resumed at DC. 5. Acute systolic CHF: Lasix changed from IV to oral 40 mg daily. Compensated. 6. Paroxysmal A. fib: Currently in sinus rhythm. Continue beta blockers and Coumadin per pharmacy. INR 1.94. Patient states that she follows up with her nephrologist regarding Coumadin management. Home health RN will have labs forwarded to the nephrologist. 7. Acute respiratory failure with hypoxia: Secondary to decompensated CHF. Resolved. 8. Anemia of chronic kidney disease: Hemoglobin was stable in the 8 g range until dropped to 7.8>8>7.7 on 1/27. S/P 1 unit of PRBC given NSTEMI & hemoglobin has improved to 9.4 g per DL. Stable 9. Essential hypertension: controlled. 10. Status post renal transplant with chronic allograft nephropathy: Continue immunosuppressants. 11. SLE/positive anticardiolipin antibodies/history of DVT/PE: On chronic Coumadin at home. INR is almost therapeutic. CT chest without contrast done 1/24. Continue Coumadin.  12. Right fibular and medial malleolus fracture: Dr. Ninfa Linden, orthopedic follow-up appreciated >x-ray shows that her fracture alignment actually looks worse and he recommends strict nonweightbearing status on right lower extremity. As per cardiology, patient would be high risk for surgery with a recent NSTEMI. Orthopedics is seen today and given her current medical status, recommend treating her right ankle fracture nonoperatively. She will be placed on new plaster splint today. Outpatient follow-up with Dr. Jean Rosenthal in 2  weeks. 13. Asymptomatic Bacteriuria: Urine culture shows >100 K diphtheroids. Patient denies symptoms suggestive of UTI.   Consultants:  Cardiology  Nephrology-signed off 1/28  CCM-consulted 1/27 for placement of central line.  Procedures:  Foley catheter-DC'd 1/26  Left IJ central line 1/27-placed due to lack of good IV access > DC'ed prior to DC.  Antimicrobials:  None    Discharge Exam:  Complaints:  Stated that she had a brief episode "5 minutes" of mild mid central chest pain early this morning which resolved spontaneously. Denies any other complaints. States that her right leg pain is not controlled with tramadol and she had stopped taking it at home. Was on Percocet at home which helped but has run out.   Filed Vitals:   02/20/15 0523 02/20/15 1500 02/20/15 2132 02/21/15 0400  BP: 145/65 124/57 135/64 137/66  Pulse: 74 77 73 78  Temp: 98 F (36.7 C) 97.5 F (36.4 C) 98.1 F (36.7 C) 98.5 F (36.9 C)  TempSrc: Oral Oral Oral Oral  Resp: 17 18    Height:      Weight: 64.7 kg (142 lb 10.2 oz)   65.6 kg (144 lb 10 oz)  SpO2: 99% 99% 99% 100%    General exam: Pleasant middle-aged female lying comfortably supine in bed. Respiratory system: clear to auscultation. No increased work of breathing. Cardiovascular system: S1 & S2 heard, RRR. No JVD, murmurs, gallops, clicks or pedal edema. Telemetry: Sinus rhythm. Gastrointestinal system: Abdomen is nondistended, soft and nontender. Normal bowel sounds heard.  Central nervous system: Alert and oriented. No focal neurological deficits. Extremities: Symmetric 5 x 5 power. RLE has new splint. Left upper arm with functioning AV fistula with thrill.  Discharge Instructions      Discharge Instructions    (HEART FAILURE PATIENTS)  Call MD:  Anytime you have any of the following symptoms: 1) 3 pound weight gain in 24 hours or 5 pounds in 1 week 2) shortness of breath, with or without a dry hacking cough 3) swelling in the  hands, feet or stomach 4) if you have to sleep on extra pillows at night in order to breathe.    Complete by:  As directed      Call MD for:  difficulty breathing, headache or visual disturbances    Complete by:  As directed      Call MD for:  extreme fatigue    Complete by:  As directed      Call MD for:  persistant dizziness or light-headedness    Complete by:  As directed      Call MD for:  persistant nausea and vomiting    Complete by:  As directed      Call MD for:  severe uncontrolled pain    Complete by:  As directed      Call MD for:  temperature >100.4    Complete by:  As directed      Diet - low sodium heart healthy    Complete by:  As directed      Discharge instructions    Complete by:  As directed   Absolutely no weightbearing on right leg.     Increase activity slowly    Complete by:  As directed             Medication List    STOP taking these medications        isosorbide dinitrate 10 MG tablet  Commonly known as:  ISORDIL     labetalol 200 MG tablet  Commonly known as:  NORMODYNE     traMADol 50 MG tablet  Commonly known as:  ULTRAM      TAKE these medications        acetaminophen 325 MG tablet  Commonly known as:  TYLENOL  Take 2 tablets (650 mg total) by mouth every 6 (six) hours as needed for mild pain (or Fever >/= 101).     allopurinol 100 MG tablet  Commonly known as:  ZYLOPRIM  Take 2 tablets (200 mg total) by mouth daily.     aspirin EC 81 MG tablet  Take 81 mg by mouth every evening.     atorvastatin 40 MG tablet  Commonly known as:  LIPITOR  Take 1 tablet (40 mg total) by mouth daily at 6 PM.     bimatoprost 0.01 % Soln  Commonly known as:  LUMIGAN  Place 1 drop into both eyes at bedtime.     calcitRIOL 0.25 MCG capsule  Commonly known as:  ROCALTROL  Take 0.25-0.5 mcg by mouth daily. Take .25 mcg - .55mcg by mouth daily, alternating with .5 mcg.c     carvedilol 6.25 MG tablet  Commonly known as:  COREG  Take 1 tablet (6.25  mg total) by mouth 2 (two) times daily with a meal.     furosemide 40 MG tablet  Commonly known as:  LASIX  Take 1 tablet (40 mg total) by mouth daily.     isosorbide mononitrate 60 MG 24 hr tablet  Commonly known as:  IMDUR  Take 1 tablet (60 mg total) by mouth daily.     MAGNESIUM-OXIDE 400 (241.3 Mg) MG tablet  Generic drug:  magnesium oxide  TAKE 1 TABLET BY MOUTH TWICE DAILY     methocarbamol 500 MG tablet  Commonly known as:  ROBAXIN  Take 1 tablet (500 mg total) by mouth 2 (two) times daily.     mycophenolate 180 MG EC tablet  Commonly known as:  MYFORTIC  Take 180 mg by mouth 2 (two) times daily.     nitroGLYCERIN 0.4 MG SL tablet  Commonly known as:  NITROSTAT  Place 0.4 mg under the tongue every 5 (five) minutes as needed for chest pain.     oxyCODONE-acetaminophen 5-325 MG tablet  Commonly known as:  PERCOCET/ROXICET  Take 1 tablet by mouth every 6 (six) hours as needed for moderate pain or severe pain.     predniSONE 5 MG tablet  Commonly known as:  DELTASONE  Take 5 mg by mouth daily with breakfast.     RANEXA 500 MG 12 hr tablet  Generic drug:  ranolazine  TAKE 1 TABLET BY MOUTH TWICE DAILY     ranitidine 150 MG tablet  Commonly known as:  ZANTAC  Take 150 mg by mouth daily.     SIMBRINZA 1-0.2 % Susp  Generic drug:  Brinzolamide-Brimonidine  Place 1 drop into the left eye 3 (three) times daily.     sodium bicarbonate 650 MG tablet  Take 1,300 mg by mouth 2 (two) times daily.     tacrolimus 1 MG capsule  Commonly known as:  PROGRAF  Take 2 mg by mouth 2 (two) times daily.     timolol 0.5 % ophthalmic solution  Commonly known as:  TIMOPTIC  Place 1 drop into both eyes 2 (two) times daily.     warfarin 2.5 MG tablet  Commonly known as:  COUMADIN  Take 0.5-1 tablets (1.25-2.5 mg total) by mouth daily at 6 PM. Monday and Friday 2.5 mg and 1.25 mg on all other days       Follow-up Information    Follow up with Mcarthur Rossetti, MD.  Schedule an appointment as soon as possible for a visit in 2 weeks.   Specialty:  Orthopedic Surgery   Contact information:   [place] [address] [phone]       Follow up with Richardson Dopp, PA-C On 02/28/2015.   Specialties:  Physician Assistant, Radiology, Interventional Cardiology   Why:  @ 9:30am   Contact information:   [id number] [place] [address] [phone]       Follow up with [facility].   Why:  Registered Nurse and Physical Therapy   Contact information:   [address] [place] [address] [phone]       Follow up with [facility].   Why:  Rollator.    Contact information:   [address] [place] [address] [phone]       Follow up with DETERDING,JAMES L, MD.   Specialty:  Nephrology   Why:  MDs office will call you with follow-up appointment to be seen in 1 week. Please call the office if you don't hear back within 2-3 days.   Contact information:   [place] [address] [phone]        The results of significant diagnostics from this hospitalization (including imaging, microbiology, ancillary and laboratory) are listed below for reference.    Significant Diagnostic Studies: Dg Chest 1 View  02/15/2015  CLINICAL DATA:  65 year old personal history of kidney transplant, currently with chest pain and acute onset of severe shortness of breath earlier today. EXAM: Portable CHEST 1 VIEW  COMPARISON:  04/26/2014 and earlier. FINDINGS: Cardiac silhouette mildly to moderately enlarged, unchanged. Moderate diffuse interstitial and airspace pulmonary edema. Small bilateral pleural effusions. IMPRESSION: CHF, with stable cardiomegaly and moderate diffuse interstitial and airspace pulmonary edema associated with small bilateral effusions. Electronically Signed   By: Evangeline Dakin M.D.   On: 02/15/2015 12:45   Dg Ankle Complete  Right  02/17/2015  CLINICAL DATA:  Right ankle pain and deformity, fall in hallway January 22, 2015 EXAM: RIGHT ANKLE - COMPLETE 3+ VIEW COMPARISON:  01/23/2015 FINDINGS: There is an oblique fracture of the lateral malleolus. There is transverse fracture of the medial malleolus. There is dislocation of the tibiotalar joint with lateral rotation of the talus. Although no definite posterior malleolar fracture is identified, there is significant artifact along the posterior aspect of the ankle on the lateral view. There is dense atherosclerotic calcification of the small vessels. IMPRESSION: 1. Bimalleolar fracture of the ankle. 2. Tibiotalar dislocation, increased significantly since prior study. Electronically Signed   By: Nolon Nations M.D.   On: 02/17/2015 14:30   Dg Ankle Complete Right  01/23/2015  CLINICAL DATA:  Status post fall, with twisting injury to the right foot. Right medial malleolar pain and swelling. Initial encounter. EXAM: RIGHT ANKLE - COMPLETE 3+ VIEW COMPARISON:  Right foot radiographs performed 03/13/2010 FINDINGS: There is an oblique fracture through the distal fibular diaphysis, and an oblique medial malleolar fracture, both demonstrating mild displacement. Diffuse surrounding soft tissue swelling is noted. There is mild associated talar tilt on the oblique view. The interosseous space is within normal limits. No talar tilt or subluxation is seen. Small plantar and posterior calcaneal spurs are seen. The joint spaces are preserved. Diffuse vascular calcifications are seen. IMPRESSION: 1. Oblique fracture through the distal fibular diaphysis, and oblique medial malleolar fracture, both demonstrating mild displacement. Mild associated talar tilt on the oblique view. 2. Diffuse vascular calcifications seen. Electronically Signed   By: Garald Balding M.D.   On: 01/23/2015 06:30   Ct Head Wo Contrast  02/14/2015  CLINICAL DATA:  Dizziness and nausea this morning. Similar of stones  previously. EXAM: CT HEAD WITHOUT CONTRAST TECHNIQUE: Contiguous axial images were obtained from the base of the skull through the vertex without intravenous contrast. COMPARISON:  There is no evidence of mass effect, midline shift, or extra-axial fluid collections. There is no evidence of a space-occupying lesion or intracranial hemorrhage. There is no evidence of a cortical-based area of acute infarction. There is generalized cerebral atrophy. There is periventricular white matter low attenuation likely secondary to microangiopathy. The ventricles and sulci are appropriate for the patient's age. The basal cisterns are patent. Visualized portions of the orbits are unremarkable. The visualized portions of the paranasal sinuses and mastoid air cells are unremarkable. Cerebrovascular atherosclerotic calcifications are noted. The osseous structures are unremarkable. FINDINGS: 1. No acute intracranial pathology. 2. Chronic microvascular disease and cerebral atrophy. Electronically Signed   By: Kathreen Devoid   On: 02/14/2015 18:19   Ct Chest Wo Contrast  02/16/2015  CLINICAL DATA:  CHF exacerbation.  Kidney transplant patient. EXAM: CT CHEST WITHOUT CONTRAST TECHNIQUE: Multidetector CT imaging of the chest was performed following the standard protocol without IV contrast. COMPARISON:  08/27/2013 FINDINGS: Cardiac enlargement. Normal caliber thoracic aorta. Prominent Coronary artery and aortic calcifications. Esophagus is decompressed. No significant lymphadenopathy in the chest. Small right pleural effusion. Mild atelectasis in the lung bases. Perihilar airspace changes bilaterally and nodular ground-glass opacities on the right. These changes likely are due  to edema or may be infectious or inflammatory. Suggest follow-up in 3 months to confirm resolution.Focal area of atelectasis or scarring in the left mid lung, unchanged since previous study. No pneumothorax. Included portions of the upper abdominal organs  demonstrate extensive vascular calcifications in the abdominal aorta and major branch vessels. Bilateral renal atrophy. Surgical absence of the gallbladder. Degenerative changes in the spine. IMPRESSION: Small right pleural effusion. Patchy perihilar infiltrates and nodular infiltrates in the lungs. Changes likely represent infectious or inflammatory changes versus edema. Suggest follow-up in 3 months. Extensive vascular calcifications. Electronically Signed   By: Lucienne Capers M.D.   On: 02/16/2015 03:23   Dg Chest Port 1 View  02/18/2015  CLINICAL DATA:  Central line placement EXAM: PORTABLE CHEST 1 VIEW COMPARISON:  02/16/2015 FINDINGS: Cardiomediastinal silhouette is stable. No pulmonary edema. Tiny right pleural effusion. There is left IJ central line with tip in SVC. No pneumothorax. No segmental infiltrate. IMPRESSION: Left IJ central line in place.  No pneumothorax. Electronically Signed   By: Lahoma Crocker M.D.   On: 02/18/2015 14:44   Dg Foot Complete Right  01/23/2015  CLINICAL DATA:  Status post fall, with right medial malleolar pain and swelling. Initial encounter. EXAM: RIGHT FOOT COMPLETE - 3+ VIEW COMPARISON:  None. FINDINGS: Fractures of the distal fibula and medial malleolus are better characterized on concurrent ankle radiographs. There is mild chronic deformity involving the proximal fourth and fifth metatarsals. Visualized joint spaces are preserved. Diffuse vascular calcifications are seen. Soft tissue swelling is noted about the ankle. IMPRESSION: Fractures of the distal fibula and medial malleolus are better characterized on concurrent ankle radiographs. Diffuse vascular calcifications seen. Electronically Signed   By: Garald Balding M.D.   On: 01/23/2015 06:31    Microbiology: Recent Results (from the past 240 hour(s))  MRSA PCR Screening     Status: None   Collection Time: 02/15/15 10:46 AM  Result Value Ref Range Status   MRSA by PCR NEGATIVE NEGATIVE Final    Comment:         The GeneXpert MRSA Assay (FDA approved for NASAL specimens only), is one component of a comprehensive MRSA colonization surveillance program. It is not intended to diagnose MRSA infection nor to guide or monitor treatment for MRSA infections.   Culture, Urine     Status: None   Collection Time: 02/15/15  1:52 PM  Result Value Ref Range Status   Specimen Description URINE, CATHETERIZED  Final   Special Requests Normal  Final   Culture   Final    >=100,000 COLONIES/mL DIPHTHEROIDS(CORYNEBACTERIUM SPECIES) Standardized susceptibility testing for this organism is not available.    Report Status 02/16/2015 FINAL  Final     Labs: Basic Metabolic Panel:  Recent Labs Lab 02/15/15 0527  02/17/15 0326 02/18/15 0327 02/19/15 0428 02/20/15 0535 02/21/15 0400  NA 134*  < > 135 136 137 139 138  K 5.5*  < > 3.8 3.3* 3.8 3.5 3.9  CL 105  < > 103 105 102 100* 105  CO2 19*  < > 21* 23 25 27 27   GLUCOSE 97  < > 111* 116* 111* 121* 110*  BUN 45*  < > 50* 49* 51* 50* 46*  CREATININE 3.41*  < > 3.34* 2.92* 2.54* 2.43* 2.20*  CALCIUM 9.6  < > 9.1 8.9 9.1 9.3 9.3  MG 1.8  --   --   --   --   --   --   PHOS 3.1  --  3.5 3.1 2.4* 2.7 2.8  < > = values in this interval not displayed. Liver Function Tests:  Recent Labs Lab 02/14/15 1837 02/15/15 0527 02/17/15 0326 02/18/15 0327 02/19/15 0428 02/20/15 0535 02/21/15 0400  AST 18 18  --   --   --   --   --   ALT 10* 9*  --   --   --   --   --   ALKPHOS 95 86  --   --   --   --   --   BILITOT 0.8 0.9  --   --   --   --   --   PROT 6.0* 5.8*  --   --   --   --   --   ALBUMIN 2.9* 2.8* 2.4* 2.2* 2.4* 2.2* 2.5*   No results for input(s): LIPASE, AMYLASE in the last 168 hours. No results for input(s): AMMONIA in the last 168 hours. CBC:  Recent Labs Lab 02/14/15 1837  02/17/15 1245 02/18/15 0327 02/18/15 2059 02/19/15 0428 02/20/15 0535 02/21/15 0400  WBC 6.3  < > 6.7 5.9  --  6.2 5.9 6.4  NEUTROABS 4.7  --   --   --   --    --   --   --   HGB 8.6*  < > 8.0* 7.7* 9.2* 9.1* 8.9* 9.4*  HCT 27.4*  < > 24.7* 23.5* 29.3* 28.6* 27.9* 29.2*  MCV 82.8  < > 83.2 83.3  --  83.9 84.3 84.4  PLT 199  < > 185 181  --  178 145* 152  < > = values in this interval not displayed. Cardiac Enzymes:  Recent Labs Lab 02/15/15 0527 02/15/15 1827 02/16/15 0524 02/16/15 1158 02/16/15 1836  TROPONINI <0.03 13.13* 13.48* 9.78* 7.34*   BNP: BNP (last 3 results) No results for input(s): BNP in the last 8760 hours.  ProBNP (last 3 results) No results for input(s): PROBNP in the last 8760 hours.  CBG:  Recent Labs Lab 02/15/15 0201  GLUCAP 97       Signed:  Parrish Bonn, MD, FACP, FHM. [facility] Pager [phone]  If 7PM-7AM, please contact night-coverage [email or web address] Password [email or web address] 02/21/2015, 12:00 PM

## 2015-02-21 NOTE — Care Management Important Message (Signed)
Important Message  Patient Details  Name: Debbie [name] MRN: [id number] Date of Birth: 07/04/1950   Medicare Important Message Given:  Yes    Mardell Suttles [place] 02/21/2015, 2:12 PM

## 2015-02-21 NOTE — Progress Notes (Signed)
ANTICOAGULATION CONSULT NOTE - Follow-Up Pharmacy Consult for Coumadin Indication: atrial fibrillation  Labs:  Recent Labs  02/19/15 0428 02/20/15 0535 02/21/15 0400  HGB 9.1* 8.9* 9.4*  HCT 28.6* 27.9* 29.2*  PLT 178 145* 152  LABPROT 20.1* 19.8* 22.1*  INR 1.71* 1.69* 1.94*  CREATININE 2.54* 2.43* 2.20*    Estimated Creatinine Clearance: 22.1 mL/min (by C-G formula based on Cr of 2.2).  Assessment: 65 y.o. female with h/o Afib/DVT and antiphospholipid syndrome, on warfarin PTA. INR on admission supratherapeutic at 3.86. Warfarin was held d/t supratherapeutic level and possible cath. Patient now refusing cath, coumadin was restarted on 1/27. INR 1.94 today, hgb/plt stable .   PTA Warfarin: 2.5 mg Mon/Fri, 1.25 mg all other days, last dose 1/22  Goal of Therapy:  INR 2-3 Monitor platelets by anticoagulation protocol: Yes   Plan:  -Plan to Resume home dose schedule of warfarin 2.5 mg on Mondays and Fridays, and 1.25 mg on all other days -Daily INR/CBC -Monitor for s/sx of bleeding -warfarin education  Thank you for allowing [place] to participate in this patients care. Jens Som, PharmD 02/21/2015 9:04 AM

## 2015-02-21 NOTE — Progress Notes (Signed)
Report received via Josh RN in patient's room using SBAR format, reviewed chart, orders, labs, VS, meds and patient's general condition, assumed care of patient.

## 2015-02-21 NOTE — Discharge Instructions (Addendum)
Keep your right ankle splint clean and dry. Absolutely no weight bearing on your right ankle at all.  Non-ST Segment Elevation Heart Attack A heart attack (myocardial infarction) happens when some of the heart muscle is injured or dies because it does not get enough oxygen. A non-ST segment elevation heart attack is a type of heart attack. It happens when the body does not get enough oxygen because an artery carrying blood to the heart muscles (coronary artery) becomes partly or temporarily blocked. This type of heart attack is usually less severe than the type of heart attack in which a coronary artery becomes completely blocked. CAUSES The most common cause of this condition is a blocked coronary artery. A coronary artery can become blocked from a gradual buildup of cholesterol, fat, and plaque. A blood clot can form over the plaque and block blood flow. RISK FACTORS This condition is more likely to develop in:  Smokers.  Males.  Older adults.  Overweight and obese adults.  People with high blood pressure (hypertension), high cholesterol, or diabetes.  People with a family history of heart disease.  People who do not get enough exercise.  People who are under a lot of stress.  People who drink too much alcohol.  People who use illegal street drugs that increase the heart rate, such as cocaine and methamphetamines. SYMPTOMS Symptoms of this condition include:  Chest pain or a feeling of pressure in the chest. It may feel like something is crushing or squeezing the chest.  Discomfort in the upper back or in the area between the shoulder blades.  Upper back pain.  Tingling in the hands and arms.  Shortness of breath.  Heartburn or indigestion.  Sudden cold sweats.  Unexplained sweating.  Sudden lightheadedness.  Unexplained feelings of nervousness or anxiety.  Feeling of tiredness, or not feeling well. DIAGNOSIS This condition is diagnosed based on a person's  signs and symptoms and a physical exam. You may also have tests done, including:  Blood tests.  A chest X-ray.  An test to measure the electrical activity of the heart (electrocardiogram).  A test that uses sound waves to produce a picture of the heart (echocardiogram).  A test to look at the heart arteries (coronary angiogram). If you are still having chest pain after 12-24 hours, or if your health care providers think your heart is at risk, you may have a procedure called cardiac catheterization. In this procedure, a long, thin tube is inserted into an artery in your groin and moved up to the arteries in your heart. This procedure helps your health care provider figure out the source of the problem.  TREATMENT This condition may be treated with:  Bed rest in the hospital.  Medicines to relieve chest pain.  Medicines to protect the heart.  If you have a blockage, a procedure in which the artery is opened (angioplasty) and a stent is placed to keep the artery open. After initial treatment you may need to take medicine to:  Keep your blood from clotting too easily.  Control your blood pressure.  Lower your cholesterol.  Control abnormal heart rhythms (arrhythmias). HOME CARE INSTRUCTIONS  Take medicines only as directed by your health care provider.  Do not take the following medicines unless your health care provider approves:  Nonsteroidal anti-inflammatory drugs (NSAIDs), such as ibuprofen, naproxen, or celecoxib.  Vitamin supplements that contain vitamin A, vitamin E, or both.  Hormone replacement therapy that contains estrogen with or without progestin.  Make  lifestyle changes as directed by your health care provider. These may include:  Using no tobacco products, including cigarettes, chewing tobacco, and electronic cigarettes. If you are struggling to quit, ask your health care provider for help.  Exercising as directed by your health care provider. Ask for a list  of activities that are safe for you.  Eating a heart-healthy diet. Work with a Firefighter to learn healthy eating options.  Maintaining a healthy weight.  Managing other medical conditions, like diabetes.  Reducing stress.  Limiting how much alcohol you drink as directed by your health care provider. SEEK IMMEDIATE MEDICAL CARE IF:  You have any symptoms of this condition.   This information is not intended to replace advice given to you by your health care provider. Make sure you discuss any questions you have with your health care provider.   Document Released: 08/07/2004 Document Revised: 04/02/2011 Document Reviewed: 12/16/2013 Elsevier Interactive Patient Education 2016 [employer]  Acute Kidney Injury Acute kidney injury is any condition in which there is sudden (acute) damage to the kidneys. Acute kidney injury was previously known as acute kidney failure or acute renal failure. The kidneys are two organs that lie on either side of the spine between the middle of the back and the front of the abdomen. The kidneys:  Remove wastes and extra water from the blood.   Produce important hormones. These help keep bones strong, regulate blood pressure, and help create red blood cells.   Balance the fluids and chemicals in the blood and tissues. A small amount of kidney damage may not cause problems, but a large amount of damage may make it difficult or impossible for the kidneys to work the way they should. Acute kidney injury may develop into long-lasting (chronic) kidney disease. It may also develop into a life-threatening disease called end-stage kidney disease. Acute kidney injury can get worse very quickly, so it should be treated right away. Early treatment may prevent other kidney diseases from developing. CAUSES   A problem with blood flow to the kidneys. This may be caused by:   Blood loss.   Heart disease.   Severe burns.   Liver disease.  Direct  damage to the kidneys. This may be caused by:  Some medicines.   A kidney infection.   Poisoning or consuming toxic substances.   A surgical wound.   A blow to the kidney area.   A problem with urine flow. This may be caused by:   Cancer.   Kidney stones.   An enlarged prostate. SIGNS AND SYMPTOMS   Swelling (edema) of the legs, ankles, or feet.   Tiredness (lethargy).   Nausea or vomiting.   Confusion.   Problems with urination, such as:   Painful or burning feeling during urination.   Decreased urine production.   Frequent accidents in children who are potty trained.   Bloody urine.   Muscle twitches and cramps.   Shortness of breath.   Seizures.   Chest pain or pressure. Sometimes, no symptoms are present. DIAGNOSIS Acute kidney injury may be detected and diagnosed by tests, including blood, urine, imaging, or kidney biopsy tests.  TREATMENT Treatment of acute kidney injury varies depending on the cause and severity of the kidney damage. In mild cases, no treatment may be needed. The kidneys may heal on their own. If acute kidney injury is more severe, your health care provider will treat the cause of the kidney damage, help the kidneys heal, and prevent complications  from occurring. Severe cases may require a procedure to remove toxic wastes from the body (dialysis) or surgery to repair kidney damage. Surgery may involve:   Repair of a torn kidney.   Removal of an obstruction. HOME CARE INSTRUCTIONS  Follow your prescribed diet.  Take medicines only as directed by your health care provider.  Do not take any new medicines (prescription, over-the-counter, or nutritional supplements) unless approved by your health care provider. Many medicines can worsen your kidney damage or may need to have the dose adjusted.   Keep all follow-up visits as directed by your health care provider. This is important.  Observe your condition to  make sure you are healing as expected. SEEK IMMEDIATE MEDICAL CARE IF:  You are feeling ill or have severe pain in the back or side.   Your symptoms return or you have new symptoms.  You have any symptoms of end-stage kidney disease. These include:   Persistent itchiness.   Loss of appetite.   Headaches.   Abnormally dark or light skin.  Numbness in the hands or feet.   Easy bruising.   Frequent hiccups.   Menstruation stops.   You have a fever.  You have increased urine production.  You have pain or bleeding when urinating. MAKE SURE YOU:   Understand these instructions.  Will watch your condition.  Will get help right away if you are not doing well or get worse.   This information is not intended to replace advice given to you by your health care provider. Make sure you discuss any questions you have with your health care provider.   Document Released: 07/24/2010 Document Revised: 01/29/2014 Document Reviewed: 09/07/2011 Elsevier Interactive Patient Education 2016 [employer].   Pain Medicine Instructions  HOW CAN PAIN MEDICINE AFFECT ME?    You were given a prescription for pain medicine. This medicine may make you tired or drowsy and may affect your ability to think clearly. Pain medicine may also affect your ability to drive or perform certain physical activities. It may not be possible to make all of your pain go away, but you should be comfortable enough to move, breathe, and take care of yourself.  HOW OFTEN SHOULD I TAKE PAIN MEDICINE AND HOW MUCH SHOULD I TAKE?  Take pain medicine only as directed by your health care provider and only as needed for pain.  You do not need to take pain medicine if you are not having pain, unless directed by your health care provider.  You can take less than the prescribed dose if you find that a smaller amount of medicine controls your pain. WHAT RESTRICTIONS DO I HAVE WHILE TAKING PAIN MEDICINE?  Follow these  instructions after you start taking pain medicine, while you are taking the medicine, and for 8 hours after you stop taking the medicine:  Do not drive.  Do not operate machinery.  Do not operate power tools.  Do not sign legal documents.  Do not drink alcohol.  Do not take sleeping pills.  Do not supervise children by yourself.  Do not participate in activities that require climbing or being in high places.  Do not enter a body of water--such as a lake, river, ocean, spa, or swimming pool--without an adult nearby who can monitor and help you. HOW CAN I KEEP OTHERS SAFE WHILE I AM TAKING PAIN MEDICINE?  Store your pain medicine as directed by your health care provider. Make sure that it is placed where children and pets cannot  reach it.  Never share your pain medicine with anyone.  Do not save any leftover pills. If you have any leftover pain medicine, get rid of it or destroy it as directed by your health care provider. WHAT ELSE DO I NEED TO KNOW ABOUT TAKING PAIN MEDICINE?  Use a stool softener if you become constipated from your pain medicine. Increasing your intake of fruits and vegetables will also help with constipation.  Write down the times when you take your pain medicine. Look at the times before you take your next dose of medicine. It is easy to become confused while on pain medicine. Recording the times helps you to avoid an overdose.  If your pain is severe, do not try to treat it yourself by taking more pills than instructed on your prescription. Contact your health care provider for help.  You may have been prescribed a pain medicine that contains acetaminophen. Do not take any other acetaminophen while taking this medicine. An overdose of acetaminophen can result in severe liver damage. Acetaminophen is found in many over-the-counter (OTC) and prescription medicines. If you are taking any medicines in addition to your pain medicine, check the active ingredients on those medicines  to see if acetaminophen is listed. WHEN SHOULD I CALL MY HEALTH CARE PROVIDER?  Your medicine is not helping to make the pain go away.  You vomit or have diarrhea shortly after taking the medicine.  You develop new pain in areas that did not hurt before.  You have an allergic reaction to your medicine. This may include:  Itchiness.  Swelling.  Dizziness.  Developing a new rash. WHEN SHOULD I CALL 911 OR GO TO THE EMERGENCY ROOM?  You feel dizzy or you faint.  You are very confused or disoriented.  You repeatedly vomit.  Your skin or lips turn pale or bluish in color.  You have shortness of breath or you are breathing much more slowly than usual.  You have a severe allergic reaction to your medicine. This includes:  Developing tongue swelling.  Having difficulty breathing. This information is not intended to replace advice given to you by your health care provider. Make sure you discuss any questions you have with your health care provider.  Document Released: 04/16/2000 Document Revised: 05/25/2014 Document Reviewed: 11/12/2013  Elsevier Interactive Patient Education 2016 [place] on my medicine - Coumadin   (Warfarin)  This medication education was reviewed with me or my healthcare representative as part of my discharge preparation.    Why was Coumadin prescribed for you? Coumadin was prescribed for you because you have a blood clot or a medical condition that can cause an increased risk of forming blood clots. Blood clots can cause serious health problems by blocking the flow of blood to the heart, lung, or brain. Coumadin can prevent harmful blood clots from forming. As a reminder your indication for Coumadin is:   Deep Vein Thrombosis Treatment  What test will check on my response to Coumadin? While on Coumadin (warfarin) you will need to have an INR test regularly to ensure that your dose is keeping you in the desired range. The INR (international normalized  ratio) number is calculated from the result of the laboratory test called prothrombin time (PT).  If an INR APPOINTMENT HAS NOT ALREADY BEEN MADE FOR YOU please schedule an appointment to have this lab work done by your health care provider within 7 days. Your INR goal is usually a number between:  2  to 3 or your provider may give you a more narrow range like 2-2.5.  Ask your health care provider during an office visit what your goal INR is.  What  do you need to  know  About  COUMADIN? Take Coumadin (warfarin) exactly as prescribed by your healthcare provider about the same time each day.  DO NOT stop taking without talking to the doctor who prescribed the medication.  Stopping without other blood clot prevention medication to take the place of Coumadin may increase your risk of developing a new clot or stroke.  Get refills before you run out.  What do you do if you miss a dose? If you miss a dose, take it as soon as you remember on the same day then continue your regularly scheduled regimen the next day.  Do not take two doses of Coumadin at the same time.  Important Safety Information A possible side effect of Coumadin (Warfarin) is an increased risk of bleeding. You should call your healthcare provider right away if you experience any of the following: ? Bleeding from an injury or your nose that does not stop. ? Unusual colored urine (red or dark brown) or unusual colored stools (red or black). ? Unusual bruising for unknown reasons. ? A serious fall or if you hit your head (even if there is no bleeding).  Some foods or medicines interact with Coumadin (warfarin) and might alter your response to warfarin. To help avoid this: ? Eat a balanced diet, maintaining a consistent amount of Vitamin K. ? Notify your provider about major diet changes you plan to make. ? Avoid alcohol or limit your intake to 1 drink for women and 2 drinks for men per day. (1 drink is 5 oz. wine, 12 oz. beer, or 1.5  oz. liquor.)  Make sure that ANY health care provider who prescribes medication for you knows that you are taking Coumadin (warfarin).  Also make sure the healthcare provider who is monitoring your Coumadin knows when you have started a new medication including herbals and non-prescription products.  Coumadin (Warfarin)  Major Drug Interactions  Increased Warfarin Effect Decreased Warfarin Effect  Alcohol (large quantities) Antibiotics (esp. Septra/Bactrim, Flagyl, Cipro) Amiodarone (Cordarone) Aspirin (ASA) Cimetidine (Tagamet) Megestrol (Megace) NSAIDs (ibuprofen, naproxen, etc.) Piroxicam (Feldene) Propafenone (Rythmol SR) Propranolol (Inderal) Isoniazid (INH) Posaconazole (Noxafil) Barbiturates (Phenobarbital) Carbamazepine (Tegretol) Chlordiazepoxide (Librium) Cholestyramine (Questran) Griseofulvin Oral Contraceptives Rifampin Sucralfate (Carafate) Vitamin K   Coumadin (Warfarin) Major Herbal Interactions  Increased Warfarin Effect Decreased Warfarin Effect  Garlic Ginseng Ginkgo biloba Coenzyme Q10 Green tea St. Johns wort    Coumadin (Warfarin) FOOD Interactions  Eat a consistent number of servings per week of foods HIGH in Vitamin K (1 serving =  cup)  Collards (cooked, or boiled & drained) Kale (cooked, or boiled & drained) Mustard greens (cooked, or boiled & drained) Parsley *serving size only =  cup Spinach (cooked, or boiled & drained) Swiss chard (cooked, or boiled & drained) Turnip greens (cooked, or boiled & drained)  Eat a consistent number of servings per week of foods MEDIUM-HIGH in Vitamin K (1 serving = 1 cup)  Asparagus (cooked, or boiled & drained) Broccoli (cooked, boiled & drained, or raw & chopped) Brussel sprouts (cooked, or boiled & drained) *serving size only =  cup Lettuce, raw (green leaf, endive, romaine) Spinach, raw Turnip greens, raw & chopped   These websites have more information on Coumadin (warfarin):   FailFactory.se; VeganReport.com.au;

## 2015-02-21 NOTE — Progress Notes (Signed)
Physical Therapy Treatment Patient Details Name: Debbie [name] MRN: [id number] DOB: 1950-03-08 Today's Date: 02/21/2015    History of Present Illness Pt. presented  02/14/15 with nausea and dizziness. Pt. with NSTEMI, acute on chronic combined heart failure, AKI/CKD, h/o SLE, pulmornary edemea and CHF.  Pt. also has recent right ankle fracture which was being managed with NWB and no surgery up to this point, however alignment is worse now and may require OR per ortho.   Pt. reports difficulty with vision due to glaucoma    PT Comments    Pt demonstrates improved ability and understanding to maintain R NWB.  Pt performed transfer to chair with decreased assist and pt family educated on safe techniques for transfer at home.    Follow Up Recommendations  SNF;Supervision/Assistance - 24 hour (Pts family adamant to return patient home.  )     Equipment Recommendations   (family reports they have all equipment needed.  )    Recommendations for Other Services       Precautions / Restrictions Precautions Precautions: Fall Required Braces or Orthoses:  (Spilnt on R ankle) Restrictions RLE Weight Bearing: Non weight bearing    Mobility  Bed Mobility Overal bed mobility: Needs Assistance Bed Mobility: Supine to Sit     Supine to sit: Min guard     General bed mobility comments: Pt requires increased time to complete transfer to edge of bed.  Pt req max VCs to adjust to edge of bed while maintaining safety with technique.  Pt remains to use bed rails to achieve sitting.    Transfers Overall transfer level: Needs assistance Equipment used: Rolling walker (2 wheeled);None Transfers: Sit to/from [employer] Sit to Stand: Mod assist   Squat pivot transfers: Min assist     General transfer comment: Pt stood at [facility] with cues for R NWB, able to maintain WBS but unable to follow commands to pivot to recliner chair depsite demonstration and max verbal cueing.  Pt standing  trials limiited to 10 sec.  Pt performed squat pivot with decreased level of assist.  PTA educated pt and family on safe transfer technique and set up to complete transfers.    Ambulation/Gait Ambulation/Gait assistance:  (remains unable.)               Stairs            Wheelchair Mobility    Modified Rankin (Stroke Patients Only)       Balance Overall balance assessment: History of Falls Sitting-balance support: No upper extremity supported Sitting balance-Leahy Scale: Fair                              Cognition Arousal/Alertness: Awake/alert   Overall Cognitive Status: Within Functional Limits for tasks assessed                      Exercises      General Comments        Pertinent Vitals/Pain Pain Assessment: No/denies pain    Home Living                      Prior Function            PT Goals (current goals can now be found in the care plan section) Acute Rehab PT Goals Potential to Achieve Goals: Fair Progress towards PT goals: Progressing toward goals    Frequency  Min 3X/week    PT Plan      Co-evaluation             End of Session Equipment Utilized During Treatment: Gait belt Activity Tolerance: Patient tolerated treatment well Patient left: in chair;with call bell/phone within reach     Time: 1048-1109 PT Time Calculation (min) (ACUTE ONLY): 21 min  Charges:  $Therapeutic Activity: 8-22 mins                    G Codes:      Cristela Blue March 02, 2015, 1:17 PM  Governor Rooks, PTA

## 2015-02-21 NOTE — Clinical Social Work Note (Signed)
Per MD, patient is ready to discharge home. RN, Patient/family, and facility have been notified of patient discharge. RN given number for report. Address confirmed by patient. D/C packet on chart. Ambulance transport will be requested for patient by nurse once central line is removed. BSW Intern signing off.  Raynelle Highland BSW Intern, [id number]

## 2015-02-21 NOTE — Progress Notes (Signed)
Patient Name: GLENDA FALLS Date of Encounter: 02/21/2015     Active Problems:   Kidney transplant as cause of abnormal reaction or later complication   PAF (paroxysmal atrial fibrillation) (HCC)   HTN (hypertension)   Chronic anticoagulation - with Coumadin   AKI (acute kidney injury) ([place])   Acute renal failure superimposed on stage 3 chronic kidney disease ([place])   Dehydration   Chest pain   Acute on chronic renal failure (HCC)   Acute systolic heart failure (HCC)   Hyperkalemia   NSTEMI (non-ST elevated myocardial infarction) ([place])   Anemia in CKD (chronic kidney disease)   Hypokalemia    SUBJECTIVE  No complaints. No more chest pain since yesterday. Ready for discharge.   CURRENT MEDS . aspirin EC  81 mg Oral QPM  . atorvastatin  40 mg Oral q1800  . bimatoprost  1 drop Both Eyes QHS  . calcitRIOL  0.25 mcg Oral QODAY  . calcitRIOL  0.5 mcg Oral QODAY  . carvedilol  6.25 mg Oral BID WC  . darbepoetin (ARANESP) injection - NON-DIALYSIS  60 mcg Subcutaneous Q Wed-1800  . famotidine  10 mg Oral Daily  . furosemide  40 mg Oral Daily  . isosorbide mononitrate  60 mg Oral Daily  . methocarbamol  500 mg Oral BID  . mycophenolate  180 mg Oral BID  . predniSONE  5 mg Oral Q breakfast  . sodium bicarbonate  1,300 mg Oral BID  . sodium chloride flush  10-40 mL Intracatheter Q12H  . tacrolimus  2 mg Oral BID  . timolol  1 drop Both Eyes BID  . Warfarin - Pharmacist Dosing Inpatient   Does not apply q1800    OBJECTIVE  Filed Vitals:   02/20/15 0523 02/20/15 1500 02/20/15 2132 02/21/15 0400  BP: 145/65 124/57 135/64 137/66  Pulse: 74 77 73 78  Temp: 98 F (36.7 C) 97.5 F (36.4 C) 98.1 F (36.7 C) 98.5 F (36.9 C)  TempSrc: Oral Oral Oral Oral  Resp: 17 18    Height:      Weight: 142 lb 10.2 oz (64.7 kg)   144 lb 10 oz (65.6 kg)  SpO2: 99% 99% 99% 100%    Intake/Output Summary (Last 24 hours) at 02/21/15 0653 Last data filed at 02/20/15 2100  Gross per 24  hour  Intake    360 ml  Output    825 ml  Net   -465 ml   Filed Weights   02/19/15 1631 02/20/15 0523 02/21/15 0400  Weight: 146 lb 4.8 oz (66.361 kg) 142 lb 10.2 oz (64.7 kg) 144 lb 10 oz (65.6 kg)    PHYSICAL EXAM  General: Pleasant, NAD. Chronically ill appearing  Neuro: Alert and oriented X 3. Moves all extremities spontaneously. Psych: Normal affect. HEENT:  Normal  Neck: Supple without bruits or JVD. Lungs:  Resp regular and unlabored, CTA. Heart: RRR no s3, s4, or murmurs. Abdomen: Soft, non-tender, non-distended, BS + x 4.  Extremities: No clubbing, cyanosis or edema. DP/PT/Radials 2+ and equal bilaterally.  Accessory Clinical Findings  CBC  Recent Labs  02/20/15 0535 02/21/15 0400  WBC 5.9 6.4  HGB 8.9* 9.4*  HCT 27.9* 29.2*  MCV 84.3 84.4  PLT 145* 0000000   Basic Metabolic Panel  Recent Labs  02/20/15 0535 02/21/15 0400  NA 139 138  K 3.5 3.9  CL 100* 105  CO2 27 27  GLUCOSE 121* 110*  BUN 50* 46*  CREATININE 2.43* 2.20*  CALCIUM 9.3 9.3  PHOS 2.7 2.8   Liver Function Tests  Recent Labs  02/20/15 0535 02/21/15 0400  ALBUMIN 2.2* 2.5*    TELE  NSR with 5 seconds of afib shortly after midnight last night.   Radiology/Studies  Dg Chest 1 View  02/15/2015  CLINICAL DATA:  65 year old personal history of kidney transplant, currently with chest pain and acute onset of severe shortness of breath earlier today. EXAM: Portable CHEST 1 VIEW COMPARISON:  04/26/2014 and earlier. FINDINGS: Cardiac silhouette mildly to moderately enlarged, unchanged. Moderate diffuse interstitial and airspace pulmonary edema. Small bilateral pleural effusions. IMPRESSION: CHF, with stable cardiomegaly and moderate diffuse interstitial and airspace pulmonary edema associated with small bilateral effusions. Electronically Signed   By: Evangeline Dakin M.D.   On: 02/15/2015 12:45   Dg Ankle Complete Right  02/17/2015  CLINICAL DATA:  Right ankle pain and deformity, fall in  hallway January 22, 2015 EXAM: RIGHT ANKLE - COMPLETE 3+ VIEW COMPARISON:  01/23/2015 FINDINGS: There is an oblique fracture of the lateral malleolus. There is transverse fracture of the medial malleolus. There is dislocation of the tibiotalar joint with lateral rotation of the talus. Although no definite posterior malleolar fracture is identified, there is significant artifact along the posterior aspect of the ankle on the lateral view. There is dense atherosclerotic calcification of the small vessels. IMPRESSION: 1. Bimalleolar fracture of the ankle. 2. Tibiotalar dislocation, increased significantly since prior study. Electronically Signed   By: Nolon Nations M.D.   On: 02/17/2015 14:30   Dg Ankle Complete Right  01/23/2015  CLINICAL DATA:  Status post fall, with twisting injury to the right foot. Right medial malleolar pain and swelling. Initial encounter. EXAM: RIGHT ANKLE - COMPLETE 3+ VIEW COMPARISON:  Right foot radiographs performed 03/13/2010 FINDINGS: There is an oblique fracture through the distal fibular diaphysis, and an oblique medial malleolar fracture, both demonstrating mild displacement. Diffuse surrounding soft tissue swelling is noted. There is mild associated talar tilt on the oblique view. The interosseous space is within normal limits. No talar tilt or subluxation is seen. Small plantar and posterior calcaneal spurs are seen. The joint spaces are preserved. Diffuse vascular calcifications are seen. IMPRESSION: 1. Oblique fracture through the distal fibular diaphysis, and oblique medial malleolar fracture, both demonstrating mild displacement. Mild associated talar tilt on the oblique view. 2. Diffuse vascular calcifications seen. Electronically Signed   By: Garald Balding M.D.   On: 01/23/2015 06:30   Ct Head Wo Contrast  02/14/2015  CLINICAL DATA:  Dizziness and nausea this morning. Similar of stones previously. EXAM: CT HEAD WITHOUT CONTRAST TECHNIQUE: Contiguous axial images were  obtained from the base of the skull through the vertex without intravenous contrast. COMPARISON:  There is no evidence of mass effect, midline shift, or extra-axial fluid collections. There is no evidence of a space-occupying lesion or intracranial hemorrhage. There is no evidence of a cortical-based area of acute infarction. There is generalized cerebral atrophy. There is periventricular white matter low attenuation likely secondary to microangiopathy. The ventricles and sulci are appropriate for the patient's age. The basal cisterns are patent. Visualized portions of the orbits are unremarkable. The visualized portions of the paranasal sinuses and mastoid air cells are unremarkable. Cerebrovascular atherosclerotic calcifications are noted. The osseous structures are unremarkable. FINDINGS: 1. No acute intracranial pathology. 2. Chronic microvascular disease and cerebral atrophy. Electronically Signed   By: Kathreen Devoid   On: 02/14/2015 18:19   Ct Chest Wo Contrast  02/16/2015  CLINICAL DATA:  CHF exacerbation.  Kidney transplant patient. EXAM: CT CHEST WITHOUT CONTRAST TECHNIQUE: Multidetector CT imaging of the chest was performed following the standard protocol without IV contrast. COMPARISON:  08/27/2013 FINDINGS: Cardiac enlargement. Normal caliber thoracic aorta. Prominent Coronary artery and aortic calcifications. Esophagus is decompressed. No significant lymphadenopathy in the chest. Small right pleural effusion. Mild atelectasis in the lung bases. Perihilar airspace changes bilaterally and nodular ground-glass opacities on the right. These changes likely are due to edema or may be infectious or inflammatory. Suggest follow-up in 3 months to confirm resolution.Focal area of atelectasis or scarring in the left mid lung, unchanged since previous study. No pneumothorax. Included portions of the upper abdominal organs demonstrate extensive vascular calcifications in the abdominal aorta and major branch  vessels. Bilateral renal atrophy. Surgical absence of the gallbladder. Degenerative changes in the spine. IMPRESSION: Small right pleural effusion. Patchy perihilar infiltrates and nodular infiltrates in the lungs. Changes likely represent infectious or inflammatory changes versus edema. Suggest follow-up in 3 months. Extensive vascular calcifications. Electronically Signed   By: Lucienne Capers M.D.   On: 02/16/2015 03:23   Dg Chest Port 1 View  02/18/2015  CLINICAL DATA:  Central line placement EXAM: PORTABLE CHEST 1 VIEW COMPARISON:  02/16/2015 FINDINGS: Cardiomediastinal silhouette is stable. No pulmonary edema. Tiny right pleural effusion. There is left IJ central line with tip in SVC. No pneumothorax. No segmental infiltrate. IMPRESSION: Left IJ central line in place.  No pneumothorax. Electronically Signed   By: Lahoma Crocker M.D.   On: 02/18/2015 14:44   Dg Foot Complete Right  01/23/2015  CLINICAL DATA:  Status post fall, with right medial malleolar pain and swelling. Initial encounter. EXAM: RIGHT FOOT COMPLETE - 3+ VIEW COMPARISON:  None. FINDINGS: Fractures of the distal fibula and medial malleolus are better characterized on concurrent ankle radiographs. There is mild chronic deformity involving the proximal fourth and fifth metatarsals. Visualized joint spaces are preserved. Diffuse vascular calcifications are seen. Soft tissue swelling is noted about the ankle. IMPRESSION: Fractures of the distal fibula and medial malleolus are better characterized on concurrent ankle radiographs. Diffuse vascular calcifications seen. Electronically Signed   By: Garald Balding M.D.   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 She then developed chest pain and dyspnea while in the ED. Subsequently developed worsening dyspnea and was transferred to stepdown unit for BiPAP. Follow-up EKG revealed new LBBB. Started on NTG drip. Cardiology and nephrology consulted.  NSTEMI: Troponin peaked: 13.48. 2D ECHO: LVEF 40-45% w/ WMA in distal anteroseptal region. Dr. Tamala Julian has discussed extensively with patient/family and recommended continued aggressive medical management and consider aggressive evaluation i.e. cardiac catheterization if her creatinine returns to baseline as it was 5-6 months ago or if she develops worsening anginal symptoms or dyspnea. Patient at this time does not wish to proceed with due to worry of CIN.  Imdur was added and increased to 60mg  yesterday. Per Dr. 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 Briefly hydrated  with IV fluids which had to be discontinued secondary to CHF. Creat improving 3.64--> 2.20 today.   Acute on chronic systolic CHF: due to IVFs. EF 40-45%. Diuresed with IV lasix. Lasix changed from IV to oral 40 mg daily on 02/17/05. Now compensated. Continue Coreg 6.25mg  BID. No ACE/ARB due to CKD. Now on imdur. Consider adding hydralazine.   Acute anemia: now s/p 1U and Hg stable in 9s  PAF: maintaining NSR. Had a brief 5 sec run late last night. Continue coumadin for CHADSVASC score of at least 5 (CHF, HTN, Age, F sex, vasc dz).   SLE/positive anticardiolipin antibodies/history of DVT/PE: On chronic Coumadin at home. INR subtherapeutic at 1.9. Coumadin per pharmacy  Right fibular and medial malleolus fracture: Ortho recommending we treat non operatively due to high risk for surgery.    Dispo: SW recommends SNF but patient and family are wanting the patient to return home with HHPT. She has an appointment that was previously scheduled with Dr. Radford Pax 03/22/15. I have made a week TOC appt in our office as well  Signed, Eileen Stanford PA-C  Pager [phone]

## 2015-02-21 NOTE — Progress Notes (Signed)
   02/21/15 0936  PT Visit Information  Reason Eval/Treat Not Completed Fatigue/lethargy limiting ability to participate (pt reports to return later)

## 2015-02-23 ENCOUNTER — Other Ambulatory Visit: Payer: Self-pay

## 2015-02-23 NOTE — Patient Outreach (Signed)
Telephone call for initial contact for transition of care. Patient answered the telephone, identified herself by providing date of birth and address.  Patient explained reason for hospitalization was because she broke her foot.  Patient and this RNCM agreed on home visit next week, at which time, we will focus on her case management care plan goal creation.    Plan: Home visit next week

## 2015-02-25 ENCOUNTER — Ambulatory Visit: Payer: Self-pay

## 2015-02-27 NOTE — Progress Notes (Signed)
Cardiology Office Note:    Date:  02/27/2015   ID:  Debbie [name], DOB [birth year], MRN [id number]  PCP:  No PCP Per Patient  Cardiologist:  Dr. Fransico Him   Electrophysiologist:  n/a  Chief Complaint  Patient presents with  . Hospitalization Follow-up    NSTEMI; Acute Systolic CHF     History of Present Illness:     Debbie [name] is a 65 y.o. female with a hx of     Past Medical History  Diagnosis Date  . Kidney transplant as cause of abnormal reaction or later complication   . Hypertension   . Colon polyps   . Gall stones 1980  . Anti-phospholipid antibody syndrome [facility])     (Based on hospital discharge summary march 2013)  . Paroxysmal atrial fibrillation (HCC)   . Carpal tunnel syndrome 2003    lt  . Esophageal stricture   . Hemorrhoids   . Renal disorder   . ESRD (end stage renal disease) on dialysis [facility])     "til I got my transplant in 2010"  . DVT (deep venous thrombosis) (HCC) 1990    LLE  . History of blood transfusion     "related to the lupus"  . Dyslipidemia 07/22/2013  . Coronary artery disease   . Heart murmur     mild MR on echo  . Pneumonia     "3 times now" (08/25/2013)  . GERD (gastroesophageal reflux disease)   . Arthritis     "fingers" (08/25/2013)  . Glaucoma, left eye   . Bilateral carotid artery stenosis     1-39%  . Pulmonary HTN (HCC)     PASP 21mmHg    Past Surgical History  Procedure Laterality Date  . Nephrectomy transplanted organ  05/2008  . Cholecystectomy  1980  . Hematoma evacuation  1998    "after they took peritoneal catheter out"  . Peritoneal catheter insertion    . Peritoneal catheter removal    . Carpal tunnel release Right 07/2001  . Arteriovenous graft placement    . Thrombectomy and revision of arterioventous (av) goretex  graft Right 07/1999; 04/2002; 09/2002; 12/2005; 09/2007;     upper arm/notes 07/31/1999; 05/18/2002; 10/07/2002; 01/14/2006; 09/14/2007;   . Right heart catheterization N/A 08/28/2013    Procedure:  RIGHT HEART CATH;  Surgeon: Sinclair Grooms, MD;  Location: [facility] CATH LAB;  Service: Cardiovascular;  Laterality: N/A;    Current Medications: Outpatient Prescriptions Prior to Visit  Medication Sig Dispense Refill  . acetaminophen (TYLENOL) 325 MG tablet Take 2 tablets (650 mg total) by mouth every 6 (six) hours as needed for mild pain (or Fever >/= 101).    Marland Kitchen allopurinol (ZYLOPRIM) 100 MG tablet Take 2 tablets (200 mg total) by mouth daily.    Marland Kitchen aspirin EC 81 MG tablet Take 81 mg by mouth every evening.     Marland Kitchen atorvastatin (LIPITOR) 40 MG tablet Take 1 tablet (40 mg total) by mouth daily at 6 PM. 30 tablet 0  . bimatoprost (LUMIGAN) 0.01 % SOLN Place 1 drop into both eyes at bedtime.     . Brinzolamide-Brimonidine (SIMBRINZA) 1-0.2 % SUSP Place 1 drop into the left eye 3 (three) times daily.    . calcitRIOL (ROCALTROL) 0.25 MCG capsule Take 0.25-0.5 mcg by mouth daily. Take .25 mcg - .29mcg by mouth daily, alternating with .5 mcg.c    . carvedilol (COREG) 6.25 MG tablet Take 1 tablet (6.25 mg total) by mouth 2 (  two) times daily with a meal. 60 tablet 0  . furosemide (LASIX) 40 MG tablet Take 1 tablet (40 mg total) by mouth daily. 30 tablet 0  . isosorbide mononitrate (IMDUR) 60 MG 24 hr tablet Take 1 tablet (60 mg total) by mouth daily. 30 tablet 0  . MAGNESIUM-OXIDE 400 (241.3 MG) MG tablet TAKE 1 TABLET BY MOUTH TWICE DAILY 60 tablet 3  . methocarbamol (ROBAXIN) 500 MG tablet Take 1 tablet (500 mg total) by mouth 2 (two) times daily. 20 tablet 0  . mycophenolate (MYFORTIC) 180 MG EC tablet Take 180 mg by mouth 2 (two) times daily.     . nitroGLYCERIN (NITROSTAT) 0.4 MG SL tablet Place 0.4 mg under the tongue every 5 (five) minutes as needed for chest pain.     Marland Kitchen oxyCODONE-acetaminophen (PERCOCET/ROXICET) 5-325 MG tablet Take 1 tablet by mouth every 6 (six) hours as needed for moderate pain or severe pain. 20 tablet 0  . predniSONE (DELTASONE) 5 MG tablet Take 5 mg by mouth daily with  breakfast.     . RANEXA 500 MG 12 hr tablet TAKE 1 TABLET BY MOUTH TWICE DAILY 180 tablet 1  . ranitidine (ZANTAC) 150 MG tablet Take 150 mg by mouth daily.     . sodium bicarbonate 650 MG tablet Take 1,300 mg by mouth 2 (two) times daily.     . tacrolimus (PROGRAF) 1 MG capsule Take 2 mg by mouth 2 (two) times daily.     . timolol (TIMOPTIC) 0.5 % ophthalmic solution Place 1 drop into both eyes 2 (two) times daily.     Marland Kitchen warfarin (COUMADIN) 2.5 MG tablet Take 0.5-1 tablets (1.25-2.5 mg total) by mouth daily at 6 PM. Monday and Friday 2.5 mg and 1.25 mg on all other days 30 tablet 0   No facility-administered medications prior to visit.     Allergies:   Feraheme; Vancomycin; Dorzolamide hcl-timolol mal; Gentamycin; Latanoprost; Cefazolin; Codeine; Hydrocodone-acetaminophen; and Penicillins   Social History   Social History  . Marital Status: Widowed    Spouse Name: N/A  . Number of Children: 3  . Years of Education: N/A   Occupational History  . disabled    Social History Main Topics  . Smoking status: Former Smoker -- 0.00 packs/day for 30 years    Types: Cigarettes    Quit date: 04/16/1977  . Smokeless tobacco: Never Used  . Alcohol Use: Yes  . Drug Use: No  . Sexual Activity: Not Currently   Other Topics Concern  . Not on file   Social History Narrative     Family History:  The patient's family history includes Heart disease in her mother; Hypertension in her mother. There is no history of Colon cancer, Rectal cancer, or Stomach cancer.   ROS:   Please see the history of present illness.    ROS All other systems reviewed and are negative.   Physical Exam:    VS:  There were no vitals taken for this visit.   GEN: Well nourished, well developed, in no acute distress HEENT: normal Neck: no JVD, no masses Cardiac: Normal S1/S2, RRR; no murmurs, rubs, or gallops, no edema;   carotid bruits,   Respiratory:  clear to auscultation bilaterally; no wheezing, rhonchi or  rales GI: soft, nontender, nondistended, + BS MS: no deformity or atrophy Skin: warm and dry, no rash Neuro:  Bilateral strength equal, no focal deficits  Psych: Alert and oriented x 3, normal affect  Wt Readings from Last  3 Encounters:  02/21/15 144 lb 10 oz (65.6 kg)  01/04/15 150 lb (68.04 kg)  12/21/14 150 lb 1 oz (68.068 kg)      Studies/Labs Reviewed:     EKG:  EKG is  ordered today.  The ekg ordered today demonstrates   Recent Labs: 02/15/2015: ALT 9*; Magnesium 1.8; TSH 1.966 02/21/2015: BUN 46*; Creatinine, Ser 2.20*; Hemoglobin 9.4*; Platelets 152; Potassium 3.9; Sodium 138   Recent Lipid Panel    Component Value Date/Time   CHOL 146 10/28/2014 1206   TRIG 81 10/28/2014 1206   HDL 52 10/28/2014 1206   CHOLHDL 2.8 10/28/2014 1206   VLDL 16 10/28/2014 1206   LDLCALC 78 10/28/2014 1206    Additional studies/ records that were reviewed today include:    Echo 02/15/15 Mild LVH, indeterminate diastolic function, EF [id number], diff HK, Ao sclerosis, mild AI, mean AV 10 mmHg, mod MAC, mild to mod MR, mean 9 mmHg, severe LAE, normal RVSF, PASP 60 mmHg, trivial pericardial effusion  RHC 8/15 RA 14 mm mercury; RV 76/18 mmHg; PA 73/31 mm mercury; PCWP(mean) 24 mm mercury; Cardiac Output 7.57 L per minute (Fick); main pulmonary artery saturation 70%; aortic O2 sat 95% - Severe Pulmonary HTN  Carotid US 7/15 Bilateral ICA 1-39%; R thyroid cyst  Echo 7/15 - Left ventricle: The cavity size was normal. Wall thickness was normal. Systolic function was normal. The estimated ejection fraction was in the range of 55% to 60%. - Mitral valve: There was mild regurgitation. - Left atrium: The atrium was severely dilated. - Right atrium: The atrium was mildly dilated. - Pulmonary arteries: PA peak pressure: 82 mm Hg (S).  Myoview 6/15 IMPRESSION: Lexiscan myoview: Electrically positive. Myoview scan with evidence of subendocardial scar and possible soft tissue attenuation in  the inferior/inferolateral distal anterior and apical walls. with mild distal anterior/apical ischemia. LVEF 54%   ASSESSMENT:     No diagnosis found.   PLAN:     In order of problems listed above:  1.     Medication Adjustments/Labs and Tests Ordered: Current medicines are reviewed at length with the patient today.  Concerns regarding medicines are outlined above.  Medication changes, Labs and Tests ordered today are outlined in the Patient Instructions noted below. There are no Patient Instructions on file for this visit.  Signed, Richardson Dopp, PA-C  02/27/2015 4:11 PM    [facility] [place], [place], [place]  [address] Phone: [phone]; Fax: [phone]     This encounter was created in error - please disregard.

## 2015-02-28 ENCOUNTER — Encounter: Payer: Medicare Other | Admitting: Physician Assistant

## 2015-03-03 ENCOUNTER — Other Ambulatory Visit: Payer: Self-pay

## 2015-03-03 NOTE — Patient Outreach (Signed)
[facility]) Care Management  03/03/2015  KHILYN ZAJICEK 1950/12/01 [id number]   Home visit with patient to assess community care management goals. Patient stated she fell at home, broke her foot bone in 4 places and had to have surgery.  Patient does not have a primary care physician, has been using Nephrologist, Dr. Jimmy Footman as primary care. Dr. Jimmy Footman is urging her to get connected to a [facility] primary care physician or one of her choice.  Fall Prevention and Safety measure discussed with patient including using walker to stabilize gait when walking. Patient also encouraged to continue to work with PT from [place] in order to learn to walk with a boot.    Patient encouraged not to try to get up alone as she was deconditioned before the fall and surgery due to her chronic illnesses and treatments.  Requested a list of [facility] Primary Care Physicians be sent out to patient.  Plan: Telephone call next week to continue assessment of community care needs.

## 2015-03-07 NOTE — Patient Outreach (Signed)
[facility]) Care Management  03/07/2015  Debbie [name] 1951-01-03 [id number]   Request received from Erenest Rasher, RN to mail patient a list of [facility] primary care providers at patient's request. Information sent 03/07/15.   Josepha Pigg, [place] Care Management Assistant

## 2015-03-08 NOTE — Progress Notes (Signed)
Cardiology Office Note:    Date:  03/08/2015   ID:  Debbie [name], DOB [birth year], MRN [id number]  PCP:  No PCP Per Patient  Cardiologist:  Dr. Fransico Him   Electrophysiologist:  n/a  Chief Complaint  Patient presents with  . Hospitalization Follow-up    NSTEMI; Acute Systolic CHF    History of Present Illness:     Debbie [name] is a 65 y.o. 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 She then developed chest pain and dyspnea while in the ED. Subsequently developed worsening dyspnea and was transferred to stepdown unit for BiPAP. Follow-up EKG revealed new LBBB. Started on NTG drip. Cardiology and nephrology consulted.  NSTEMI: Troponin peaked: 13.48. 2D ECHO: LVEF 40-45% w/ WMA in distal anteroseptal region. Dr. Tamala Julian has discussed extensively with patient/family and recommended continued aggressive medical management and consider aggressive evaluation i.e. cardiac catheterization if her creatinine returns to baseline as it was 5-6 months ago or if she develops worsening anginal symptoms or dyspnea. Patient at this time does not wish to proceed with due to worry of CIN. Imdur was added and increased to 60mg  yesterday. Per Dr. 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 Briefly hydrated with IV fluids which had to be discontinued secondary to CHF. Creat improving 3.64--> 2.20 today.   Acute on chronic systolic CHF: due to IVFs. EF 40-45%. Diuresed with  IV lasix. Lasix changed from IV to oral 40 mg daily on 02/17/05. Now compensated. Continue Coreg 6.25mg  BID. No ACE/ARB due to CKD. Now on imdur. Consider adding hydralazine.   Acute anemia: now s/p 1U and Hg stable in 9s  PAF: maintaining NSR. Had a brief 5 sec run late last night. Continue coumadin for CHADSVASC score of at least 5 (CHF, HTN, Age, F sex, vasc dz).   SLE/positive anticardiolipin antibodies/history of DVT/PE: On chronic Coumadin at home. INR subtherapeutic at 1.9. Coumadin per pharmacy  Right fibular and medial malleolus fracture: Ortho recommending we treat non operatively due to high risk for surgery.   Dispo: SW recommends SNF but patient and family are wanting the patient to return home with HHPT. She has an appointment that was previously scheduled with Dr. Radford Pax 03/22/15. I have made a week TOC appt in our office as well  Given her renal dysfunction, we will proceed with conservative therapy with aspirin, atorvastatin, carvedilol and imdur. She is not on DAPT due to anemia requiring transfusion and the need for coumadin for SLE, APLA, and atrial fibrillation. No spironolactone due to renal dysfunction. If blood pressure allows, will add hydralazine. We have scheduled follow up in 1 week. If renal function improves or she has recurrent symptoms, heart cath can be reconsidered.    Past Medical History  Diagnosis Date  . Kidney transplant as cause of abnormal reaction or later complication   . Hypertension   . Colon polyps   . Gall stones 1980  . Anti-phospholipid antibody syndrome [facility])     (Based on hospital discharge summary march 2013)  .  Paroxysmal atrial fibrillation (HCC)   . Carpal tunnel syndrome 2003    lt  . Esophageal stricture   . Hemorrhoids   . Renal disorder   . ESRD (end stage renal disease) on dialysis [facility])     "til I got my transplant in 2010"  . DVT (deep venous thrombosis) (HCC) 1990    LLE  . History of blood transfusion     "related  to the lupus"  . Dyslipidemia 07/22/2013  . Coronary artery disease   . Heart murmur     mild MR on echo  . Pneumonia     "3 times now" (08/25/2013)  . GERD (gastroesophageal reflux disease)   . Arthritis     "fingers" (08/25/2013)  . Glaucoma, left eye   . Bilateral carotid artery stenosis     1-39%  . Pulmonary HTN (HCC)     PASP 40mmHg    Past Surgical History  Procedure Laterality Date  . Nephrectomy transplanted organ  05/2008  . Cholecystectomy  1980  . Hematoma evacuation  1998    "after they took peritoneal catheter out"  . Peritoneal catheter insertion    . Peritoneal catheter removal    . Carpal tunnel release Right 07/2001  . Arteriovenous graft placement    . Thrombectomy and revision of arterioventous (av) goretex  graft Right 07/1999; 04/2002; 09/2002; 12/2005; 09/2007;     upper arm/notes 07/31/1999; 05/18/2002; 10/07/2002; 01/14/2006; 09/14/2007;   . Right heart catheterization N/A 08/28/2013    Procedure: RIGHT HEART CATH;  Surgeon: Sinclair Grooms, MD;  Location: [facility] CATH LAB;  Service: Cardiovascular;  Laterality: N/A;    Current Medications: Outpatient Prescriptions Prior to Visit  Medication Sig Dispense Refill  . acetaminophen (TYLENOL) 325 MG tablet Take 2 tablets (650 mg total) by mouth every 6 (six) hours as needed for mild pain (or Fever >/= 101).    Marland Kitchen allopurinol (ZYLOPRIM) 100 MG tablet Take 2 tablets (200 mg total) by mouth daily.    Marland Kitchen aspirin EC 81 MG tablet Take 81 mg by mouth every evening.     Marland Kitchen atorvastatin (LIPITOR) 40 MG tablet Take 1 tablet (40 mg total) by mouth daily at 6 PM. 30 tablet 0  . bimatoprost (LUMIGAN) 0.01 % SOLN Place 1 drop into both eyes at bedtime.     . Brinzolamide-Brimonidine (SIMBRINZA) 1-0.2 % SUSP Place 1 drop into the left eye 3 (three) times daily.    . calcitRIOL (ROCALTROL) 0.25 MCG capsule Take 0.25-0.5 mcg by mouth daily. Take .25 mcg - .43mcg by mouth daily, alternating with .5 mcg.c    . carvedilol (COREG) 6.25 MG tablet Take  1 tablet (6.25 mg total) by mouth 2 (two) times daily with a meal. 60 tablet 0  . furosemide (LASIX) 40 MG tablet Take 1 tablet (40 mg total) by mouth daily. 30 tablet 0  . isosorbide mononitrate (IMDUR) 60 MG 24 hr tablet Take 1 tablet (60 mg total) by mouth daily. 30 tablet 0  . MAGNESIUM-OXIDE 400 (241.3 MG) MG tablet TAKE 1 TABLET BY MOUTH TWICE DAILY 60 tablet 3  . methocarbamol (ROBAXIN) 500 MG tablet Take 1 tablet (500 mg total) by mouth 2 (two) times daily. (Patient not taking: Reported on 03/03/2015) 20 tablet 0  . mycophenolate (MYFORTIC) 180 MG EC tablet Take 180 mg by mouth 2 (two) times daily.     . nitroGLYCERIN (NITROSTAT) 0.4 MG SL tablet Place 0.4 mg under the tongue every 5 (five) minutes as needed  for chest pain.     Marland Kitchen oxyCODONE-acetaminophen (PERCOCET/ROXICET) 5-325 MG tablet Take 1 tablet by mouth every 6 (six) hours as needed for moderate pain or severe pain. 20 tablet 0  . predniSONE (DELTASONE) 5 MG tablet Take 5 mg by mouth daily with breakfast.     . RANEXA 500 MG 12 hr tablet TAKE 1 TABLET BY MOUTH TWICE DAILY 180 tablet 1  . ranitidine (ZANTAC) 150 MG tablet Take 150 mg by mouth daily.     . sodium bicarbonate 650 MG tablet Take 1,300 mg by mouth 2 (two) times daily.     . tacrolimus (PROGRAF) 1 MG capsule Take 2 mg by mouth 2 (two) times daily.     . timolol (TIMOPTIC) 0.5 % ophthalmic solution Place 1 drop into both eyes 2 (two) times daily.     Marland Kitchen warfarin (COUMADIN) 2.5 MG tablet Take 0.5-1 tablets (1.25-2.5 mg total) by mouth daily at 6 PM. Monday and Friday 2.5 mg and 1.25 mg on all other days 30 tablet 0   No facility-administered medications prior to visit.     Allergies:   Feraheme; Vancomycin; Dorzolamide hcl-timolol mal; Gentamycin; Latanoprost; Cefazolin; Codeine; Hydrocodone-acetaminophen; and Penicillins   Social History   Social History  . Marital Status: Widowed    Spouse Name: N/A  . Number of Children: 3  . Years of Education: N/A   Occupational  History  . disabled    Social History Main Topics  . Smoking status: Former Smoker -- 0.00 packs/day for 30 years    Types: Cigarettes    Quit date: 04/16/1977  . Smokeless tobacco: Never Used  . Alcohol Use: Yes  . Drug Use: No  . Sexual Activity: Not Currently   Other Topics Concern  . Not on file   Social History Narrative     Family History:  The patient's family history includes Heart disease in her mother; Hypertension in her mother. There is no history of Colon cancer, Rectal cancer, or Stomach cancer.   ROS:   Please see the history of present illness.    ROS All other systems reviewed and are negative.   Physical Exam:    VS:  There were no vitals taken for this visit.   GEN: Well nourished, well developed, in no acute distress HEENT: normal Neck: no JVD, no masses Cardiac: Normal S1/S2, RRR; no murmurs, rubs, or gallops, no edema;   carotid bruits,   Respiratory:  clear to auscultation bilaterally; no wheezing, rhonchi or rales GI: soft, nontender, nondistended, + BS MS: no deformity or atrophy Skin: warm and dry, no rash Neuro:  Bilateral strength equal, no focal deficits  Psych: Alert and oriented x 3, normal affect  Wt Readings from Last 3 Encounters:  02/21/15 144 lb 10 oz (65.6 kg)  01/04/15 150 lb (68.04 kg)  12/21/14 150 lb 1 oz (68.068 kg)      Studies/Labs Reviewed:     EKG:  EKG is  ordered today.  The ekg ordered today demonstrates   Recent Labs: 02/15/2015: ALT 9*; Magnesium 1.8; TSH 1.966 02/21/2015: BUN 46*; Creatinine, Ser 2.20*; Hemoglobin 9.4*; Platelets 152; Potassium 3.9; Sodium 138   Recent Lipid Panel    Component Value Date/Time   CHOL 146 10/28/2014 1206   TRIG 81 10/28/2014 1206   HDL 52 10/28/2014 1206   CHOLHDL 2.8 10/28/2014 1206   VLDL 16 10/28/2014 1206   LDLCALC 78 10/28/2014 1206    Additional studies/ records that were reviewed today include:  Echo 02/15/15 Mild LVH, indeterminate diastolic function, EF  [id number], diff HK, Ao sclerosis, mild AI, mean AV 10 mmHg, mod MAC, mild to mod MR, mean 9 mmHg, severe LAE, normal RVSF, PASP 60 mmHg, trivial pericardial effusion  RHC 8/15 RA 14 mm mercury; RV 76/18 mmHg; PA 73/31 mm mercury; PCWP(mean) 24 mm mercury; Cardiac Output 7.57 L per minute (Fick); main pulmonary artery saturation 70%; aortic O2 sat 95% - Severe Pulmonary HTN  Carotid US 7/15 Bilateral ICA 1-39%; R thyroid cyst  Echo 7/15 EF 55-60%, mild MR, severe LAE, mild RAE, PASP 82 mmHg  Myoview 6/15 IMPRESSION: Lexiscan myoview: Electrically positive. Myoview scan with evidence of subendocardial scar and possible soft tissue attenuation in the inferior/inferolateral distal anterior and apical walls. with mild distal anterior/apical ischemia. LVEF 54%   ASSESSMENT:     No diagnosis found.  PLAN:     In order of problems listed above:  1.    Medication Adjustments/Labs and Tests Ordered: Current medicines are reviewed at length with the patient today.  Concerns regarding medicines are outlined above.  Medication changes, Labs and Tests ordered today are outlined in the Patient Instructions noted below. There are no Patient Instructions on file for this visit. Signed, Richardson Dopp, PA-C  03/08/2015 9:11 PM    [facility] [place], [place], [place]  [address] Phone: [phone]; Fax: [phone]     This encounter was created in error - please disregard.

## 2015-03-09 ENCOUNTER — Encounter: Payer: Medicare Other | Admitting: Physician Assistant

## 2015-03-10 ENCOUNTER — Other Ambulatory Visit: Payer: Self-pay

## 2015-03-11 ENCOUNTER — Encounter: Payer: Self-pay | Admitting: Pharmacist

## 2015-03-11 ENCOUNTER — Ambulatory Visit (INDEPENDENT_AMBULATORY_CARE_PROVIDER_SITE_OTHER): Payer: Medicare Other | Admitting: Internal Medicine

## 2015-03-11 DIAGNOSIS — Z7901 Long term (current) use of anticoagulants: Secondary | ICD-10-CM

## 2015-03-11 DIAGNOSIS — I480 Paroxysmal atrial fibrillation: Secondary | ICD-10-CM

## 2015-03-11 LAB — PROTIME-INR: INR: 6.6 — AB (ref 0.9–1.1)

## 2015-03-11 LAB — POCT INR: INR: 8

## 2015-03-11 NOTE — Patient Outreach (Signed)
Call made to assess need for community care coordination. Patient's daughter, Debbie [name], answered telephone, provided patient's birth date and address as identifiers.  Debbie [name] stated patient was visiting with her friend and was not available. Debbie [name] asked by this RNCM if patient had made a decision on her choice for primary care physician. Debbie [name] informed by this RNCM the decision had to be patient's or a delegate of the patient and not this RNCM.  This RNCM advised Debbie [name] a list of [facility] primary care physicians had been sent out to patient and should be arriving in the mail soon.   Debbie [name] was also advised by this RNCM to seek advice from friends, other family or church members.    Debbie [name] stated she and her mother would look for list and ask other family members for assistance in selecting a primary care physician.  Plan: Telephone folllow up next week

## 2015-03-13 ENCOUNTER — Encounter (HOSPITAL_COMMUNITY): Payer: Self-pay | Admitting: Emergency Medicine

## 2015-03-13 ENCOUNTER — Emergency Department (HOSPITAL_COMMUNITY): Payer: Medicare Other

## 2015-03-13 ENCOUNTER — Other Ambulatory Visit: Payer: Self-pay | Admitting: Cardiology

## 2015-03-13 ENCOUNTER — Inpatient Hospital Stay (HOSPITAL_COMMUNITY)
Admission: EM | Admit: 2015-03-13 | Discharge: 2015-03-28 | DRG: 682 | Disposition: A | Discharge status: Skilled Nursing Facility | Payer: Medicare Other | Attending: Internal Medicine | Admitting: Internal Medicine

## 2015-03-13 DIAGNOSIS — N184 Chronic kidney disease, stage 4 (severe): Secondary | ICD-10-CM | POA: Diagnosis present

## 2015-03-13 DIAGNOSIS — A419 Sepsis, unspecified organism: Secondary | ICD-10-CM

## 2015-03-13 DIAGNOSIS — H409 Unspecified glaucoma: Secondary | ICD-10-CM | POA: Diagnosis present

## 2015-03-13 DIAGNOSIS — M79601 Pain in right arm: Secondary | ICD-10-CM

## 2015-03-13 DIAGNOSIS — R531 Weakness: Secondary | ICD-10-CM | POA: Diagnosis not present

## 2015-03-13 DIAGNOSIS — D631 Anemia in chronic kidney disease: Secondary | ICD-10-CM | POA: Diagnosis present

## 2015-03-13 DIAGNOSIS — I272 Other secondary pulmonary hypertension: Secondary | ICD-10-CM | POA: Diagnosis present

## 2015-03-13 DIAGNOSIS — I255 Ischemic cardiomyopathy: Secondary | ICD-10-CM | POA: Diagnosis present

## 2015-03-13 DIAGNOSIS — R7881 Bacteremia: Secondary | ICD-10-CM

## 2015-03-13 DIAGNOSIS — Z86718 Personal history of other venous thrombosis and embolism: Secondary | ICD-10-CM

## 2015-03-13 DIAGNOSIS — I129 Hypertensive chronic kidney disease with stage 1 through stage 4 chronic kidney disease, or unspecified chronic kidney disease: Secondary | ICD-10-CM | POA: Diagnosis present

## 2015-03-13 DIAGNOSIS — Z79899 Other long term (current) drug therapy: Secondary | ICD-10-CM

## 2015-03-13 DIAGNOSIS — B957 Other staphylococcus as the cause of diseases classified elsewhere: Secondary | ICD-10-CM | POA: Diagnosis present

## 2015-03-13 DIAGNOSIS — Z8249 Family history of ischemic heart disease and other diseases of the circulatory system: Secondary | ICD-10-CM

## 2015-03-13 DIAGNOSIS — D6861 Antiphospholipid syndrome: Secondary | ICD-10-CM | POA: Diagnosis present

## 2015-03-13 DIAGNOSIS — I480 Paroxysmal atrial fibrillation: Secondary | ICD-10-CM | POA: Diagnosis present

## 2015-03-13 DIAGNOSIS — Z9109 Other allergy status, other than to drugs and biological substances: Secondary | ICD-10-CM

## 2015-03-13 DIAGNOSIS — T8611 Kidney transplant rejection: Secondary | ICD-10-CM | POA: Diagnosis present

## 2015-03-13 DIAGNOSIS — Z6827 Body mass index (BMI) 27.0-27.9, adult: Secondary | ICD-10-CM

## 2015-03-13 DIAGNOSIS — Z885 Allergy status to narcotic agent status: Secondary | ICD-10-CM

## 2015-03-13 DIAGNOSIS — Z880 Allergy status to penicillin: Secondary | ICD-10-CM

## 2015-03-13 DIAGNOSIS — M25411 Effusion, right shoulder: Secondary | ICD-10-CM

## 2015-03-13 DIAGNOSIS — Z9049 Acquired absence of other specified parts of digestive tract: Secondary | ICD-10-CM

## 2015-03-13 DIAGNOSIS — I447 Left bundle-branch block, unspecified: Secondary | ICD-10-CM | POA: Diagnosis present

## 2015-03-13 DIAGNOSIS — Z452 Encounter for adjustment and management of vascular access device: Secondary | ICD-10-CM

## 2015-03-13 DIAGNOSIS — D689 Coagulation defect, unspecified: Secondary | ICD-10-CM | POA: Diagnosis present

## 2015-03-13 DIAGNOSIS — E871 Hypo-osmolality and hyponatremia: Secondary | ICD-10-CM

## 2015-03-13 DIAGNOSIS — M329 Systemic lupus erythematosus, unspecified: Secondary | ICD-10-CM | POA: Diagnosis present

## 2015-03-13 DIAGNOSIS — I5042 Chronic combined systolic (congestive) and diastolic (congestive) heart failure: Secondary | ICD-10-CM | POA: Diagnosis present

## 2015-03-13 DIAGNOSIS — E43 Unspecified severe protein-calorie malnutrition: Secondary | ICD-10-CM | POA: Diagnosis present

## 2015-03-13 DIAGNOSIS — Z888 Allergy status to other drugs, medicaments and biological substances status: Secondary | ICD-10-CM

## 2015-03-13 DIAGNOSIS — M25419 Effusion, unspecified shoulder: Secondary | ICD-10-CM | POA: Diagnosis present

## 2015-03-13 DIAGNOSIS — I10 Essential (primary) hypertension: Secondary | ICD-10-CM | POA: Diagnosis present

## 2015-03-13 DIAGNOSIS — Z87891 Personal history of nicotine dependence: Secondary | ICD-10-CM

## 2015-03-13 DIAGNOSIS — E875 Hyperkalemia: Secondary | ICD-10-CM | POA: Diagnosis not present

## 2015-03-13 DIAGNOSIS — Z940 Kidney transplant status: Secondary | ICD-10-CM

## 2015-03-13 DIAGNOSIS — I2589 Other forms of chronic ischemic heart disease: Secondary | ICD-10-CM | POA: Diagnosis present

## 2015-03-13 DIAGNOSIS — I252 Old myocardial infarction: Secondary | ICD-10-CM

## 2015-03-13 DIAGNOSIS — D6832 Hemorrhagic disorder due to extrinsic circulating anticoagulants: Secondary | ICD-10-CM | POA: Diagnosis present

## 2015-03-13 DIAGNOSIS — I2510 Atherosclerotic heart disease of native coronary artery without angina pectoris: Secondary | ICD-10-CM | POA: Diagnosis present

## 2015-03-13 DIAGNOSIS — Z7901 Long term (current) use of anticoagulants: Secondary | ICD-10-CM

## 2015-03-13 DIAGNOSIS — T45515A Adverse effect of anticoagulants, initial encounter: Secondary | ICD-10-CM | POA: Diagnosis present

## 2015-03-13 DIAGNOSIS — B952 Enterococcus as the cause of diseases classified elsewhere: Secondary | ICD-10-CM | POA: Diagnosis present

## 2015-03-13 DIAGNOSIS — M109 Gout, unspecified: Secondary | ICD-10-CM | POA: Diagnosis present

## 2015-03-13 DIAGNOSIS — N179 Acute kidney failure, unspecified: Secondary | ICD-10-CM | POA: Diagnosis not present

## 2015-03-13 DIAGNOSIS — M009 Pyogenic arthritis, unspecified: Secondary | ICD-10-CM | POA: Diagnosis present

## 2015-03-13 DIAGNOSIS — R791 Abnormal coagulation profile: Secondary | ICD-10-CM | POA: Diagnosis present

## 2015-03-13 DIAGNOSIS — K219 Gastro-esophageal reflux disease without esophagitis: Secondary | ICD-10-CM | POA: Diagnosis present

## 2015-03-13 DIAGNOSIS — E785 Hyperlipidemia, unspecified: Secondary | ICD-10-CM | POA: Diagnosis present

## 2015-03-13 DIAGNOSIS — Z79891 Long term (current) use of opiate analgesic: Secondary | ICD-10-CM

## 2015-03-13 DIAGNOSIS — E860 Dehydration: Secondary | ICD-10-CM | POA: Diagnosis present

## 2015-03-13 DIAGNOSIS — I214 Non-ST elevation (NSTEMI) myocardial infarction: Secondary | ICD-10-CM | POA: Diagnosis present

## 2015-03-13 DIAGNOSIS — I25119 Atherosclerotic heart disease of native coronary artery with unspecified angina pectoris: Secondary | ICD-10-CM | POA: Diagnosis present

## 2015-03-13 DIAGNOSIS — Z881 Allergy status to other antibiotic agents status: Secondary | ICD-10-CM

## 2015-03-13 DIAGNOSIS — Z7952 Long term (current) use of systemic steroids: Secondary | ICD-10-CM

## 2015-03-13 DIAGNOSIS — S8253XD Displaced fracture of medial malleolus of unspecified tibia, subsequent encounter for closed fracture with routine healing: Secondary | ICD-10-CM

## 2015-03-13 DIAGNOSIS — T8619 Other complication of kidney transplant: Secondary | ICD-10-CM | POA: Diagnosis present

## 2015-03-13 DIAGNOSIS — K5900 Constipation, unspecified: Secondary | ICD-10-CM | POA: Diagnosis present

## 2015-03-13 DIAGNOSIS — R609 Edema, unspecified: Secondary | ICD-10-CM

## 2015-03-13 DIAGNOSIS — Y830 Surgical operation with transplant of whole organ as the cause of abnormal reaction of the patient, or of later complication, without mention of misadventure at the time of the procedure: Secondary | ICD-10-CM | POA: Diagnosis present

## 2015-03-13 LAB — CBC
HEMATOCRIT: 25.9 % — AB (ref 36.0–46.0)
HEMOGLOBIN: 8.2 g/dL — AB (ref 12.0–15.0)
MCH: 25.8 pg — ABNORMAL LOW (ref 26.0–34.0)
MCHC: 31.7 g/dL (ref 30.0–36.0)
MCV: 81.4 fL (ref 78.0–100.0)
Platelets: 199 10*3/uL (ref 150–400)
RBC: 3.18 MIL/uL — ABNORMAL LOW (ref 3.87–5.11)
RDW: 16.1 % — AB (ref 11.5–15.5)
WBC: 10.1 10*3/uL (ref 4.0–10.5)

## 2015-03-13 LAB — BASIC METABOLIC PANEL
Anion gap: 15 (ref 5–15)
BUN: 73 mg/dL — ABNORMAL HIGH (ref 6–20)
CHLORIDE: 90 mmol/L — AB (ref 101–111)
CO2: 18 mmol/L — ABNORMAL LOW (ref 22–32)
Calcium: 8.8 mg/dL — ABNORMAL LOW (ref 8.9–10.3)
Creatinine, Ser: 4.31 mg/dL — ABNORMAL HIGH (ref 0.44–1.00)
GFR calc Af Amer: 11 mL/min — ABNORMAL LOW (ref >60)
GFR calc non Af Amer: 10 mL/min — ABNORMAL LOW (ref >60)
Glucose, Bld: 115 mg/dL — ABNORMAL HIGH (ref 65–99)
POTASSIUM: 4.7 mmol/L (ref 3.5–5.1)
Sodium: 123 mmol/L — ABNORMAL LOW (ref 135–145)

## 2015-03-13 LAB — PROTIME-INR
INR: 8.14 — AB (ref 0.00–1.49)
PROTHROMBIN TIME: 64.8 s — AB (ref 11.6–15.2)

## 2015-03-13 MED ORDER — ACETAMINOPHEN 500 MG PO TABS
1000.0000 mg | ORAL_TABLET | Freq: Once | ORAL | Status: AC
  Administered 2015-03-13: 1000 mg via ORAL
  Filled 2015-03-13: qty 2

## 2015-03-13 MED ORDER — MECLIZINE HCL 25 MG PO TABS
25.0000 mg | ORAL_TABLET | Freq: Once | ORAL | Status: AC
  Administered 2015-03-13: 25 mg via ORAL
  Filled 2015-03-13: qty 1

## 2015-03-13 MED ORDER — LORAZEPAM 1 MG PO TABS
0.5000 mg | ORAL_TABLET | Freq: Once | ORAL | Status: AC
  Administered 2015-03-13: 0.5 mg via ORAL
  Filled 2015-03-13: qty 1

## 2015-03-13 NOTE — ED Notes (Signed)
Pt stated that she is a little dizzy. Pt stated the dizziness started earlier this afternoon. Pt with no other symptoms.

## 2015-03-14 ENCOUNTER — Encounter (HOSPITAL_COMMUNITY): Payer: Self-pay | Admitting: Internal Medicine

## 2015-03-14 ENCOUNTER — Emergency Department (HOSPITAL_COMMUNITY): Payer: Medicare Other

## 2015-03-14 DIAGNOSIS — R072 Precordial pain: Secondary | ICD-10-CM | POA: Diagnosis not present

## 2015-03-14 DIAGNOSIS — R791 Abnormal coagulation profile: Secondary | ICD-10-CM | POA: Diagnosis present

## 2015-03-14 DIAGNOSIS — T8611 Kidney transplant rejection: Secondary | ICD-10-CM | POA: Diagnosis present

## 2015-03-14 DIAGNOSIS — Y830 Surgical operation with transplant of whole organ as the cause of abnormal reaction of the patient, or of later complication, without mention of misadventure at the time of the procedure: Secondary | ICD-10-CM | POA: Diagnosis present

## 2015-03-14 DIAGNOSIS — B952 Enterococcus as the cause of diseases classified elsewhere: Secondary | ICD-10-CM | POA: Diagnosis present

## 2015-03-14 DIAGNOSIS — I25119 Atherosclerotic heart disease of native coronary artery with unspecified angina pectoris: Secondary | ICD-10-CM | POA: Diagnosis present

## 2015-03-14 DIAGNOSIS — D631 Anemia in chronic kidney disease: Secondary | ICD-10-CM | POA: Diagnosis present

## 2015-03-14 DIAGNOSIS — E860 Dehydration: Secondary | ICD-10-CM | POA: Diagnosis present

## 2015-03-14 DIAGNOSIS — Z940 Kidney transplant status: Secondary | ICD-10-CM

## 2015-03-14 DIAGNOSIS — M109 Gout, unspecified: Secondary | ICD-10-CM | POA: Diagnosis present

## 2015-03-14 DIAGNOSIS — I2510 Atherosclerotic heart disease of native coronary artery without angina pectoris: Secondary | ICD-10-CM | POA: Diagnosis present

## 2015-03-14 DIAGNOSIS — N183 Chronic kidney disease, stage 3 (moderate): Secondary | ICD-10-CM

## 2015-03-14 DIAGNOSIS — N184 Chronic kidney disease, stage 4 (severe): Secondary | ICD-10-CM | POA: Diagnosis not present

## 2015-03-14 DIAGNOSIS — Z7952 Long term (current) use of systemic steroids: Secondary | ICD-10-CM | POA: Diagnosis not present

## 2015-03-14 DIAGNOSIS — D6832 Hemorrhagic disorder due to extrinsic circulating anticoagulants: Secondary | ICD-10-CM | POA: Diagnosis present

## 2015-03-14 DIAGNOSIS — E871 Hypo-osmolality and hyponatremia: Secondary | ICD-10-CM

## 2015-03-14 DIAGNOSIS — I480 Paroxysmal atrial fibrillation: Secondary | ICD-10-CM | POA: Diagnosis not present

## 2015-03-14 DIAGNOSIS — Z7901 Long term (current) use of anticoagulants: Secondary | ICD-10-CM | POA: Diagnosis not present

## 2015-03-14 DIAGNOSIS — N189 Chronic kidney disease, unspecified: Secondary | ICD-10-CM

## 2015-03-14 DIAGNOSIS — M79621 Pain in right upper arm: Secondary | ICD-10-CM | POA: Diagnosis not present

## 2015-03-14 DIAGNOSIS — A499 Bacterial infection, unspecified: Secondary | ICD-10-CM | POA: Diagnosis not present

## 2015-03-14 DIAGNOSIS — R42 Dizziness and giddiness: Secondary | ICD-10-CM | POA: Diagnosis not present

## 2015-03-14 DIAGNOSIS — I447 Left bundle-branch block, unspecified: Secondary | ICD-10-CM | POA: Diagnosis present

## 2015-03-14 DIAGNOSIS — R7881 Bacteremia: Secondary | ICD-10-CM | POA: Diagnosis present

## 2015-03-14 DIAGNOSIS — E785 Hyperlipidemia, unspecified: Secondary | ICD-10-CM | POA: Diagnosis present

## 2015-03-14 DIAGNOSIS — R531 Weakness: Secondary | ICD-10-CM | POA: Diagnosis present

## 2015-03-14 DIAGNOSIS — M79631 Pain in right forearm: Secondary | ICD-10-CM | POA: Diagnosis not present

## 2015-03-14 DIAGNOSIS — Z6827 Body mass index (BMI) 27.0-27.9, adult: Secondary | ICD-10-CM | POA: Diagnosis not present

## 2015-03-14 DIAGNOSIS — T8619 Other complication of kidney transplant: Secondary | ICD-10-CM | POA: Diagnosis present

## 2015-03-14 DIAGNOSIS — M79601 Pain in right arm: Secondary | ICD-10-CM | POA: Diagnosis not present

## 2015-03-14 DIAGNOSIS — K219 Gastro-esophageal reflux disease without esophagitis: Secondary | ICD-10-CM | POA: Diagnosis present

## 2015-03-14 DIAGNOSIS — I214 Non-ST elevation (NSTEMI) myocardial infarction: Secondary | ICD-10-CM | POA: Diagnosis not present

## 2015-03-14 DIAGNOSIS — Z87891 Personal history of nicotine dependence: Secondary | ICD-10-CM | POA: Diagnosis not present

## 2015-03-14 DIAGNOSIS — I255 Ischemic cardiomyopathy: Secondary | ICD-10-CM | POA: Diagnosis not present

## 2015-03-14 DIAGNOSIS — H409 Unspecified glaucoma: Secondary | ICD-10-CM | POA: Diagnosis present

## 2015-03-14 DIAGNOSIS — B957 Other staphylococcus as the cause of diseases classified elsewhere: Secondary | ICD-10-CM | POA: Diagnosis present

## 2015-03-14 DIAGNOSIS — M009 Pyogenic arthritis, unspecified: Secondary | ICD-10-CM | POA: Diagnosis present

## 2015-03-14 DIAGNOSIS — Z885 Allergy status to narcotic agent status: Secondary | ICD-10-CM | POA: Diagnosis not present

## 2015-03-14 DIAGNOSIS — Z888 Allergy status to other drugs, medicaments and biological substances status: Secondary | ICD-10-CM | POA: Diagnosis not present

## 2015-03-14 DIAGNOSIS — R7989 Other specified abnormal findings of blood chemistry: Secondary | ICD-10-CM | POA: Diagnosis not present

## 2015-03-14 DIAGNOSIS — I272 Other secondary pulmonary hypertension: Secondary | ICD-10-CM | POA: Diagnosis present

## 2015-03-14 DIAGNOSIS — R509 Fever, unspecified: Secondary | ICD-10-CM | POA: Diagnosis not present

## 2015-03-14 DIAGNOSIS — K5900 Constipation, unspecified: Secondary | ICD-10-CM | POA: Diagnosis present

## 2015-03-14 DIAGNOSIS — M329 Systemic lupus erythematosus, unspecified: Secondary | ICD-10-CM | POA: Diagnosis present

## 2015-03-14 DIAGNOSIS — I5042 Chronic combined systolic (congestive) and diastolic (congestive) heart failure: Secondary | ICD-10-CM | POA: Diagnosis present

## 2015-03-14 DIAGNOSIS — D689 Coagulation defect, unspecified: Secondary | ICD-10-CM | POA: Diagnosis present

## 2015-03-14 DIAGNOSIS — E43 Unspecified severe protein-calorie malnutrition: Secondary | ICD-10-CM | POA: Diagnosis present

## 2015-03-14 DIAGNOSIS — Z86718 Personal history of other venous thrombosis and embolism: Secondary | ICD-10-CM | POA: Diagnosis not present

## 2015-03-14 DIAGNOSIS — E875 Hyperkalemia: Secondary | ICD-10-CM | POA: Diagnosis not present

## 2015-03-14 DIAGNOSIS — D6861 Antiphospholipid syndrome: Secondary | ICD-10-CM | POA: Diagnosis present

## 2015-03-14 DIAGNOSIS — N179 Acute kidney failure, unspecified: Secondary | ICD-10-CM | POA: Diagnosis present

## 2015-03-14 DIAGNOSIS — R609 Edema, unspecified: Secondary | ICD-10-CM | POA: Diagnosis not present

## 2015-03-14 DIAGNOSIS — I340 Nonrheumatic mitral (valve) insufficiency: Secondary | ICD-10-CM | POA: Diagnosis not present

## 2015-03-14 DIAGNOSIS — I2589 Other forms of chronic ischemic heart disease: Secondary | ICD-10-CM | POA: Diagnosis present

## 2015-03-14 DIAGNOSIS — Z880 Allergy status to penicillin: Secondary | ICD-10-CM | POA: Diagnosis not present

## 2015-03-14 DIAGNOSIS — M25511 Pain in right shoulder: Secondary | ICD-10-CM | POA: Diagnosis not present

## 2015-03-14 DIAGNOSIS — Z9109 Other allergy status, other than to drugs and biological substances: Secondary | ICD-10-CM | POA: Diagnosis not present

## 2015-03-14 DIAGNOSIS — Z79891 Long term (current) use of opiate analgesic: Secondary | ICD-10-CM | POA: Diagnosis not present

## 2015-03-14 DIAGNOSIS — M25411 Effusion, right shoulder: Secondary | ICD-10-CM | POA: Diagnosis present

## 2015-03-14 DIAGNOSIS — I252 Old myocardial infarction: Secondary | ICD-10-CM | POA: Diagnosis not present

## 2015-03-14 DIAGNOSIS — Z79899 Other long term (current) drug therapy: Secondary | ICD-10-CM | POA: Diagnosis not present

## 2015-03-14 DIAGNOSIS — S8253XD Displaced fracture of medial malleolus of unspecified tibia, subsequent encounter for closed fracture with routine healing: Secondary | ICD-10-CM | POA: Diagnosis not present

## 2015-03-14 DIAGNOSIS — Z8249 Family history of ischemic heart disease and other diseases of the circulatory system: Secondary | ICD-10-CM | POA: Diagnosis not present

## 2015-03-14 DIAGNOSIS — Z9049 Acquired absence of other specified parts of digestive tract: Secondary | ICD-10-CM | POA: Diagnosis not present

## 2015-03-14 DIAGNOSIS — T45515A Adverse effect of anticoagulants, initial encounter: Secondary | ICD-10-CM | POA: Diagnosis present

## 2015-03-14 DIAGNOSIS — I129 Hypertensive chronic kidney disease with stage 1 through stage 4 chronic kidney disease, or unspecified chronic kidney disease: Secondary | ICD-10-CM | POA: Diagnosis present

## 2015-03-14 DIAGNOSIS — Z881 Allergy status to other antibiotic agents status: Secondary | ICD-10-CM | POA: Diagnosis not present

## 2015-03-14 LAB — BASIC METABOLIC PANEL
ANION GAP: 10 (ref 5–15)
BUN: 69 mg/dL — AB (ref 6–20)
CALCIUM: 9.1 mg/dL (ref 8.9–10.3)
CO2: 19 mmol/L — AB (ref 22–32)
Chloride: 101 mmol/L (ref 101–111)
Creatinine, Ser: 4.1 mg/dL — ABNORMAL HIGH (ref 0.44–1.00)
GFR calc Af Amer: 12 mL/min — ABNORMAL LOW (ref >60)
GFR calc non Af Amer: 11 mL/min — ABNORMAL LOW (ref >60)
GLUCOSE: 104 mg/dL — AB (ref 65–99)
POTASSIUM: 4.9 mmol/L (ref 3.5–5.1)
Sodium: 130 mmol/L — ABNORMAL LOW (ref 135–145)

## 2015-03-14 LAB — BASIC METABOLIC PANEL WITH GFR
Anion gap: 8 (ref 5–15)
Anion gap: 11 (ref 5–15)
Anion gap: 11 (ref 5–15)
BUN: 73 mg/dL — ABNORMAL HIGH (ref 6–20)
BUN: 71 mg/dL — ABNORMAL HIGH (ref 6–20)
BUN: 69 mg/dL — ABNORMAL HIGH (ref 6–20)
CO2: 21 mmol/L — ABNORMAL LOW (ref 22–32)
CO2: 19 mmol/L — ABNORMAL LOW (ref 22–32)
CO2: 19 mmol/L — ABNORMAL LOW (ref 22–32)
Calcium: 9 mg/dL (ref 8.9–10.3)
Calcium: 8.8 mg/dL — ABNORMAL LOW (ref 8.9–10.3)
Calcium: 9.2 mg/dL (ref 8.9–10.3)
Chloride: 101 mmol/L (ref 101–111)
Chloride: 102 mmol/L (ref 101–111)
Chloride: 101 mmol/L (ref 101–111)
Creatinine, Ser: 4.32 mg/dL — ABNORMAL HIGH (ref 0.44–1.00)
Creatinine, Ser: 4.06 mg/dL — ABNORMAL HIGH (ref 0.44–1.00)
Creatinine, Ser: 4.03 mg/dL — ABNORMAL HIGH (ref 0.44–1.00)
GFR calc Af Amer: 11 mL/min — ABNORMAL LOW
GFR calc Af Amer: 12 mL/min — ABNORMAL LOW
GFR calc Af Amer: 12 mL/min — ABNORMAL LOW
GFR calc non Af Amer: 10 mL/min — ABNORMAL LOW
GFR calc non Af Amer: 11 mL/min — ABNORMAL LOW
GFR calc non Af Amer: 11 mL/min — ABNORMAL LOW
Glucose, Bld: 124 mg/dL — ABNORMAL HIGH (ref 65–99)
Glucose, Bld: 108 mg/dL — ABNORMAL HIGH (ref 65–99)
Glucose, Bld: 132 mg/dL — ABNORMAL HIGH (ref 65–99)
Potassium: 5.1 mmol/L (ref 3.5–5.1)
Potassium: 5 mmol/L (ref 3.5–5.1)
Potassium: 5.1 mmol/L (ref 3.5–5.1)
Sodium: 130 mmol/L — ABNORMAL LOW (ref 135–145)
Sodium: 132 mmol/L — ABNORMAL LOW (ref 135–145)
Sodium: 131 mmol/L — ABNORMAL LOW (ref 135–145)

## 2015-03-14 LAB — PROTIME-INR
INR: 8.43 — AB (ref 0.00–1.49)
Prothrombin Time: 66.5 seconds — ABNORMAL HIGH (ref 11.6–15.2)

## 2015-03-14 LAB — CBC WITH DIFFERENTIAL/PLATELET
Basophils Absolute: 0 K/uL (ref 0.0–0.1)
Basophils Relative: 0 %
Eosinophils Absolute: 0 K/uL (ref 0.0–0.7)
Eosinophils Relative: 0 %
HCT: 22.7 % — ABNORMAL LOW (ref 36.0–46.0)
Hemoglobin: 7.4 g/dL — ABNORMAL LOW (ref 12.0–15.0)
Lymphocytes Relative: 19 %
Lymphs Abs: 1.5 K/uL (ref 0.7–4.0)
MCH: 26.4 pg (ref 26.0–34.0)
MCHC: 32.6 g/dL (ref 30.0–36.0)
MCV: 81.1 fL (ref 78.0–100.0)
Monocytes Absolute: 0.4 K/uL (ref 0.1–1.0)
Monocytes Relative: 6 %
Neutro Abs: 6 K/uL (ref 1.7–7.7)
Neutrophils Relative %: 75 %
Platelets: 174 K/uL (ref 150–400)
RBC: 2.8 MIL/uL — ABNORMAL LOW (ref 3.87–5.11)
RDW: 16 % — ABNORMAL HIGH (ref 11.5–15.5)
WBC: 8 K/uL (ref 4.0–10.5)

## 2015-03-14 LAB — MAGNESIUM: Magnesium: 2.2 mg/dL (ref 1.7–2.4)

## 2015-03-14 LAB — PREPARE RBC (CROSSMATCH)

## 2015-03-14 LAB — OSMOLALITY, URINE: Osmolality, Ur: 376 mOsm/kg (ref 300–900)

## 2015-03-14 LAB — TROPONIN I
Troponin I: 0.05 ng/mL — ABNORMAL HIGH
Troponin I: 0.04 ng/mL — ABNORMAL HIGH
Troponin I: 0.05 ng/mL — ABNORMAL HIGH
Troponin I: 0.05 ng/mL — ABNORMAL HIGH

## 2015-03-14 LAB — SODIUM, URINE, RANDOM: Sodium, Ur: 47 mmol/L

## 2015-03-14 MED ORDER — FENTANYL CITRATE (PF) 100 MCG/2ML IJ SOLN: 12.5000 ug | INTRAMUSCULAR | Status: DC | PRN

## 2015-03-14 MED ORDER — SODIUM CHLORIDE 0.9 % IV BOLUS (SEPSIS)
2000.0000 mL | Freq: Once | INTRAVENOUS | Status: AC
  Administered 2015-03-14: 2000 mL via INTRAVENOUS

## 2015-03-14 MED ORDER — ACETAMINOPHEN 325 MG PO TABS
650.0000 mg | ORAL_TABLET | Freq: Four times a day (QID) | ORAL | Status: DC | PRN
Start: 2015-03-14 — End: 2015-03-28
  Administered 2015-03-20: 650 mg via ORAL
  Filled 2015-03-14: qty 2

## 2015-03-14 MED ORDER — SODIUM CHLORIDE 0.9 % IV SOLN
Freq: Once | INTRAVENOUS | Status: AC
  Administered 2015-03-14: 17:00:00 via INTRAVENOUS

## 2015-03-14 MED ORDER — CALCITRIOL 0.5 MCG PO CAPS
0.5000 ug | ORAL_CAPSULE | ORAL | Status: DC
  Administered 2015-03-15 – 2015-03-27 (×7): 0.5 ug via ORAL
  Filled 2015-03-14 (×8): qty 1

## 2015-03-14 MED ORDER — LABETALOL HCL 100 MG PO TABS: 100.0000 mg | ORAL_TABLET | Freq: Two times a day (BID) | ORAL | Status: DC

## 2015-03-14 MED ORDER — FAMOTIDINE 20 MG PO TABS
20.0000 mg | ORAL_TABLET | Freq: Two times a day (BID) | ORAL | Status: DC
  Administered 2015-03-14: 20 mg via ORAL
  Filled 2015-03-14: qty 1

## 2015-03-14 MED ORDER — LATANOPROST 0.005 % OP SOLN
1.0000 [drp] | Freq: Every day | OPHTHALMIC | Status: DC
  Administered 2015-03-14 – 2015-03-27 (×14): 1 [drp] via OPHTHALMIC
  Filled 2015-03-14 (×2): qty 2.5

## 2015-03-14 MED ORDER — TIMOLOL MALEATE 0.5 % OP SOLN: 1.0000 [drp] | Freq: Two times a day (BID) | OPHTHALMIC | Status: DC

## 2015-03-14 MED ORDER — CALCITRIOL 0.25 MCG PO CAPS
0.2500 ug | ORAL_CAPSULE | ORAL | Status: DC
  Administered 2015-03-14 – 2015-03-28 (×8): 0.25 ug via ORAL
  Filled 2015-03-14 (×11): qty 1

## 2015-03-14 MED ORDER — ONDANSETRON HCL 4 MG PO TABS: 4.0000 mg | ORAL_TABLET | Freq: Four times a day (QID) | ORAL | Status: DC | PRN

## 2015-03-14 MED ORDER — TACROLIMUS 1 MG PO CAPS
2.0000 mg | ORAL_CAPSULE | Freq: Two times a day (BID) | ORAL | Status: DC
  Administered 2015-03-14 – 2015-03-28 (×29): 2 mg via ORAL
  Filled 2015-03-14 (×31): qty 2

## 2015-03-14 MED ORDER — CALCITRIOL 0.25 MCG PO CAPS: 0.2500 ug | ORAL_CAPSULE | ORAL | Status: DC

## 2015-03-14 MED ORDER — FAMOTIDINE 20 MG PO TABS
20.0000 mg | ORAL_TABLET | Freq: Every day | ORAL | Status: DC
  Administered 2015-03-15 – 2015-03-28 (×14): 20 mg via ORAL
  Filled 2015-03-14 (×14): qty 1

## 2015-03-14 MED ORDER — PHYTONADIONE 5 MG PO TABS
2.5000 mg | ORAL_TABLET | Freq: Once | ORAL | Status: AC
  Administered 2015-03-14: 2.5 mg via ORAL
  Filled 2015-03-14: qty 1

## 2015-03-14 MED ORDER — ENSURE ENLIVE PO LIQD
237.0000 mL | Freq: Two times a day (BID) | ORAL | Status: DC
  Administered 2015-03-14 – 2015-03-17 (×6): 237 mL via ORAL

## 2015-03-14 MED ORDER — MAGNESIUM OXIDE 400 (241.3 MG) MG PO TABS
400.0000 mg | ORAL_TABLET | Freq: Two times a day (BID) | ORAL | Status: DC
  Administered 2015-03-14 – 2015-03-28 (×29): 400 mg via ORAL
  Filled 2015-03-14 (×29): qty 1

## 2015-03-14 MED ORDER — ALLOPURINOL 100 MG PO TABS
200.0000 mg | ORAL_TABLET | Freq: Every day | ORAL | Status: DC
  Administered 2015-03-14 – 2015-03-28 (×15): 200 mg via ORAL
  Filled 2015-03-14 (×17): qty 2

## 2015-03-14 MED ORDER — CARVEDILOL 6.25 MG PO TABS
6.2500 mg | ORAL_TABLET | Freq: Two times a day (BID) | ORAL | Status: DC
  Administered 2015-03-14 – 2015-03-17 (×7): 6.25 mg via ORAL
  Filled 2015-03-14 (×7): qty 1

## 2015-03-14 MED ORDER — RANOLAZINE ER 500 MG PO TB12
500.0000 mg | ORAL_TABLET | Freq: Two times a day (BID) | ORAL | Status: DC
Start: 2015-03-14 — End: 2015-03-16
  Administered 2015-03-14 – 2015-03-16 (×6): 500 mg via ORAL
  Filled 2015-03-14 (×6): qty 1

## 2015-03-14 MED ORDER — WARFARIN - PHARMACIST DOSING INPATIENT
Freq: Every day | Status: DC
  Administered 2015-03-25 – 2015-03-28 (×3)

## 2015-03-14 MED ORDER — SODIUM BICARBONATE 650 MG PO TABS
1300.0000 mg | ORAL_TABLET | Freq: Two times a day (BID) | ORAL | Status: DC
  Administered 2015-03-14 – 2015-03-28 (×29): 1300 mg via ORAL
  Filled 2015-03-14 (×29): qty 2

## 2015-03-14 MED ORDER — ISOSORBIDE MONONITRATE ER 60 MG PO TB24
60.0000 mg | ORAL_TABLET | Freq: Every day | ORAL | Status: DC
  Administered 2015-03-14 – 2015-03-17 (×4): 60 mg via ORAL
  Filled 2015-03-14 (×4): qty 1

## 2015-03-14 MED ORDER — ACETAMINOPHEN 650 MG RE SUPP: 650.0000 mg | Freq: Four times a day (QID) | RECTAL | Status: DC | PRN

## 2015-03-14 MED ORDER — ATORVASTATIN CALCIUM 40 MG PO TABS
40.0000 mg | ORAL_TABLET | Freq: Every day | ORAL | Status: DC
  Administered 2015-03-14 – 2015-03-28 (×15): 40 mg via ORAL
  Filled 2015-03-14 (×15): qty 1

## 2015-03-14 MED ORDER — PREDNISONE 5 MG PO TABS
5.0000 mg | ORAL_TABLET | Freq: Every day | ORAL | Status: DC
  Administered 2015-03-14 – 2015-03-28 (×15): 5 mg via ORAL
  Filled 2015-03-14 (×15): qty 1

## 2015-03-14 MED ORDER — MYCOPHENOLATE SODIUM 180 MG PO TBEC
180.0000 mg | DELAYED_RELEASE_TABLET | Freq: Two times a day (BID) | ORAL | Status: DC
  Administered 2015-03-14 – 2015-03-28 (×30): 180 mg via ORAL
  Filled 2015-03-14 (×30): qty 1

## 2015-03-14 MED ORDER — ONDANSETRON HCL 4 MG/2ML IJ SOLN: 4.0000 mg | Freq: Four times a day (QID) | INTRAMUSCULAR | Status: DC | PRN

## 2015-03-14 MED ORDER — BISACODYL 10 MG RE SUPP
10.0000 mg | Freq: Once | RECTAL | Status: AC
  Administered 2015-03-14: 10 mg via RECTAL
  Filled 2015-03-14: qty 1

## 2015-03-14 MED ORDER — NITROGLYCERIN 0.4 MG SL SUBL
0.4000 mg | SUBLINGUAL_TABLET | SUBLINGUAL | Status: DC | PRN
  Administered 2015-03-14 – 2015-03-26 (×14): 0.4 mg via SUBLINGUAL
  Filled 2015-03-14 (×12): qty 1

## 2015-03-14 MED ORDER — LINEZOLID 600 MG/300ML IV SOLN
600.0000 mg | Freq: Two times a day (BID) | INTRAVENOUS | Status: DC
  Administered 2015-03-14: 600 mg via INTRAVENOUS
  Filled 2015-03-14 (×3): qty 300

## 2015-03-14 MED ORDER — OXYCODONE-ACETAMINOPHEN 5-325 MG PO TABS
1.0000 | ORAL_TABLET | Freq: Four times a day (QID) | ORAL | Status: DC | PRN
  Administered 2015-03-14 – 2015-03-28 (×12): 1 via ORAL
  Filled 2015-03-14 (×14): qty 1

## 2015-03-14 NOTE — Progress Notes (Addendum)
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 Jacques Navy appointment 02/16/15) presented to [facility] with 1 day history of dizziness and generalized weakness. The patient was recently discharged from the hospital on 02/21/2015 after treatment for NSTEMI and acute on chronic renal failure. The patient states that she has had poor by mouth intake for the past 2-3 days with constipation. Upon presentation, she was noted to have a serum creatinine of 4.31.  The patient received 2 L bolus of fluid in the emergency department. She was also found to have INR of 8.14.  On the afternoon of 03/14/2015, the patient developed substernal chest pain for which she received nitroglycerin with relief. Cardiology was consulted. EKG showed a new LBBB.  Assessment/Plan: Acute on chronic renal failure (CKD 4) -due to volume depletion -continue judicious IVF -baseline creatinine 2.2-2.3 -Patient endorses compliance with all her medications including immunosuppressive therapy  Chest pain and new LBBB on EKG -Concerning for angina with new left bundle branch block -relieved after SL NTG x 1 -EKG--new LBBB -consulted cardiology -pt had NSTEMI last admission in Jan 2017 -02/15/2015 echo EF 40-45%, PAP 60 -continue ASA -continue imdur/Ranexa -defer Ranexa pharmacy concern to cardiology -continue  lipitor -elevated troponins likely due to renal failure  Hyponatremia -Secondary to volume depletion -Improving with fluids  Paroxysmal atrial fibrillation -Presently in sinus rhythm -Rate controlled -Continue warfarin  Warfarin-induced coagulopathy -Patient has supratherapeutic INR -As the patient is not actively bleeding and hemodynamically stable, allow INR to drift down -vitamin K if bleed or INR continues to rise  Hypertension -Continue carvedilol  SLE/positive anticardiolipin antibodies/history of DVT/PE:  -On chronic Coumadin at home. INR is almost therapeutic. Status post renal transplant with chronic allograft nephropathy: Continue immunosuppressants  Anemia of CKD -Baseline hemoglobin 9-10 -transfuse one unit PRBC in setting of drop in Hgb and chest pain  Right fibular and medial malleolus fracture -As per cardiology, patient would be high risk for surgery with a recent NSTEMI -continue CAM boot -outpt follow up with Dr. Ninfa Linden   Family Communication:   Granddaughter updated at beside 03/14/15 Disposition Plan:   Home 2-3 days       Procedures/Studies: Dg Chest 1 View  02/15/2015  CLINICAL DATA:  65 year old personal history of kidney transplant, currently with chest pain and acute onset of severe shortness of breath earlier today. EXAM: Portable CHEST 1 VIEW COMPARISON:  04/26/2014 and earlier. FINDINGS: Cardiac silhouette mildly to moderately enlarged, unchanged. Moderate diffuse interstitial and airspace pulmonary edema. Small bilateral pleural effusions. IMPRESSION: CHF, with stable cardiomegaly and moderate diffuse interstitial and airspace pulmonary edema associated with small bilateral effusions. Electronically Signed   By: Evangeline Dakin M.D.   On: 02/15/2015 12:45   Dg Ankle Complete Right  02/17/2015  CLINICAL DATA:  Right ankle pain and deformity, fall in hallway January 22, 2015 EXAM: RIGHT ANKLE - COMPLETE 3+ VIEW COMPARISON:  01/23/2015  FINDINGS: There is an oblique fracture of the lateral malleolus. There is transverse fracture  of the medial malleolus. There is dislocation of the tibiotalar joint with lateral rotation of the talus. Although no definite posterior malleolar fracture is identified, there is significant artifact along the posterior aspect of the ankle on the lateral view. There is dense atherosclerotic calcification of the small vessels. IMPRESSION: 1. Bimalleolar fracture of the ankle. 2. Tibiotalar dislocation, increased significantly since prior study. Electronically Signed   By: Nolon Nations M.D.   On: 02/17/2015 14:30   Ct Head Wo Contrast  03/14/2015  CLINICAL DATA:  Acute onset of dizziness.  Initial encounter. EXAM: CT HEAD WITHOUT CONTRAST TECHNIQUE: Contiguous axial images were obtained from the base of the skull through the vertex without intravenous contrast. COMPARISON:  CT of the head performed 02/14/2015 FINDINGS: There is no evidence of acute infarction, mass lesion, or intra- or extra-axial hemorrhage on CT. Prominence of the ventricles sulci reflects mild cortical volume loss. The brainstem and fourth ventricle are within normal limits. The basal ganglia are unremarkable in appearance. The cerebral hemispheres demonstrate grossly normal gray-white differentiation. No mass effect or midline shift is seen. There is no evidence of fracture; visualized osseous structures are unremarkable in appearance. The visualized portions of the orbits are within normal limits. The paranasal sinuses and mastoid air cells are well-aerated. No significant soft tissue abnormalities are seen. IMPRESSION: 1. No acute intracranial pathology seen on CT. 2. Mild cortical volume loss noted. Electronically Signed   By: Garald Balding M.D.   On: 03/14/2015 00:35   Ct Head Wo Contrast  02/14/2015  CLINICAL DATA:  Dizziness and nausea this morning. Similar of stones previously. EXAM: CT HEAD WITHOUT CONTRAST TECHNIQUE: Contiguous axial  images were obtained from the base of the skull through the vertex without intravenous contrast. COMPARISON:  There is no evidence of mass effect, midline shift, or extra-axial fluid collections. There is no evidence of a space-occupying lesion or intracranial hemorrhage. There is no evidence of a cortical-based area of acute infarction. There is generalized cerebral atrophy. There is periventricular white matter low attenuation likely secondary to microangiopathy. The ventricles and sulci are appropriate for the patient's age. The basal cisterns are patent. Visualized portions of the orbits are unremarkable. The visualized portions of the paranasal sinuses and mastoid air cells are unremarkable. Cerebrovascular atherosclerotic calcifications are noted. The osseous structures are unremarkable. FINDINGS: 1. No acute intracranial pathology. 2. Chronic microvascular disease and cerebral atrophy. Electronically Signed   By: Kathreen Devoid   On: 02/14/2015 18:19   Ct Chest Wo Contrast  02/16/2015  CLINICAL DATA:  CHF exacerbation.  Kidney transplant patient. EXAM: CT CHEST WITHOUT CONTRAST TECHNIQUE: Multidetector CT imaging of the chest was performed following the standard protocol without IV contrast. COMPARISON:  08/27/2013 FINDINGS: Cardiac enlargement. Normal caliber thoracic aorta. Prominent Coronary artery and aortic calcifications. Esophagus is decompressed. No significant lymphadenopathy in the chest. Small right pleural effusion. Mild atelectasis in the lung bases. Perihilar airspace changes bilaterally and nodular ground-glass opacities on the right. These changes likely are due to edema or may be infectious or inflammatory. Suggest follow-up in 3 months to confirm resolution.Focal area of atelectasis or scarring in the left mid lung, unchanged since previous study. No pneumothorax. Included portions of the upper abdominal organs demonstrate extensive vascular calcifications in the abdominal aorta and major  branch vessels. Bilateral renal atrophy. Surgical absence of the gallbladder. Degenerative changes in the spine. IMPRESSION: Small right pleural effusion. Patchy perihilar infiltrates and nodular infiltrates in the lungs. Changes likely represent infectious or inflammatory changes  versus edema. Suggest follow-up in 3 months. Extensive vascular calcifications. Electronically Signed   By: Lucienne Capers M.D.   On: 02/16/2015 03:23   Dg Chest Portable 1 View  03/14/2015  CLINICAL DATA:  Fever and weakness tonight. EXAM: PORTABLE CHEST 1 VIEW COMPARISON:  Radiograph 02/18/2015.  Chest CT 02/16/2015 FINDINGS: Cardiomediastinal contours are unchanged allowing for patient rotation. Small left pleural effusion, diminished from prior. No evidence of right pleural effusion. Left basilar scarring. No pulmonary edema. No confluent airspace disease. No pneumothorax. IMPRESSION: Persistent but decreased left pleural effusion. Exam is otherwise unchanged. Electronically Signed   By: Jeb Levering M.D.   On: 03/14/2015 02:09   Dg Chest Port 1 View  02/18/2015  CLINICAL DATA:  Central line placement EXAM: PORTABLE CHEST 1 VIEW COMPARISON:  02/16/2015 FINDINGS: Cardiomediastinal silhouette is stable. No pulmonary edema. Tiny right pleural effusion. There is left IJ central line with tip in SVC. No pneumothorax. No segmental infiltrate. IMPRESSION: Left IJ central line in place.  No pneumothorax. Electronically Signed   By: Lahoma Crocker M.D.   On: 02/18/2015 14:44         Subjective:  patient developed chest pain secondary in with some shortness of breath. Relieved by nitroglycerin sublingual. Denies any fevers, chills, vomiting, nausea, abdominal pain, dysuria, hematuria or rashes.   Objective: Filed Vitals:   03/14/15 0536 03/14/15 0900 03/14/15 1405 03/14/15 1435  BP: 148/56 163/61 143/59 140/57  Pulse: 75 65 76 76  Temp: 98.4 F (36.9 C) 98.5 F (36.9 C) 98.2 F (36.8 C)   TempSrc: Oral Oral Oral    Resp: 19 18    Height:      Weight:      SpO2: 99% 96%  98%    Intake/Output Summary (Last 24 hours) at 03/14/15 1445 Last data filed at 03/14/15 1100  Gross per 24 hour  Intake   2000 ml  Output    225 ml  Net   1775 ml   Weight change:  Exam:   General:  Pt is alert, follows commands appropriately, not in acute distress  HEENT: No icterus, No thrush, No neck mass, [place]/AT  Cardiovascular: RRR, S1/S2, no rubs, no gallops  Respiratory: left basilar crackles. No wheezing. Good air movement.   Abdomen: Soft/+BS, non tender, non distended, no guarding; no hepatosplenomegaly   Extremities: No edema, No lymphangitis, No petechiae, No rashes, no synovitis  Data Reviewed: Basic Metabolic Panel:  Recent Labs Lab 03/13/15 2039 03/14/15 0426 03/14/15 0730 03/14/15 1139  NA 123* 130* 132* 130*  K 4.7 5.1 5.0 4.9  CL 90* 101 102 101  CO2 18* 21* 19* 19*  GLUCOSE 115* 124* 108* 104*  BUN 73* 73* 71* 69*  CREATININE 4.31* 4.32* 4.06* 4.10*  CALCIUM 8.8* 9.0 8.8* 9.1  MG  --  2.2  --   --    Liver Function Tests: No results for input(s): AST, ALT, ALKPHOS, BILITOT, PROT, ALBUMIN in the last 168 hours. No results for input(s): LIPASE, AMYLASE in the last 168 hours. No results for input(s): AMMONIA in the last 168 hours. CBC:  Recent Labs Lab 03/13/15 2039 03/14/15 0426  WBC 10.1 8.0  NEUTROABS  --  6.0  HGB 8.2* 7.4*  HCT 25.9* 22.7*  MCV 81.4 81.1  PLT 199 174   Cardiac Enzymes:  Recent Labs Lab 03/14/15 0426 03/14/15 0730 03/14/15 1139  TROPONINI 0.05* 0.04* 0.05*   BNP: Invalid input(s): POCBNP CBG: No results for input(s): GLUCAP in the last  168 hours.  No results found for this or any previous visit (from the past 240 hour(s)).   Scheduled Meds: . allopurinol  200 mg Oral Daily  . atorvastatin  40 mg Oral q1800  . calcitRIOL  0.25 mcg Oral QODAY  . [START ON 03/15/2015] calcitRIOL  0.5 mcg Oral QODAY  . carvedilol  6.25 mg Oral BID WC  .  famotidine  20 mg Oral BID  . feeding supplement (ENSURE ENLIVE)  237 mL Oral BID BM  . isosorbide mononitrate  60 mg Oral Daily  . latanoprost  1 drop Both Eyes QHS  . magnesium oxide  400 mg Oral BID  . mycophenolate  180 mg Oral BID  . predniSONE  5 mg Oral Q breakfast  . ranolazine  500 mg Oral BID  . sodium bicarbonate  1,300 mg Oral BID  . tacrolimus  2 mg Oral BID  . Warfarin - Pharmacist Dosing Inpatient   Does not apply q1800   Continuous Infusions:    Arita Severtson, DO  [facility] Pager [phone]  If 7PM-7AM, please contact night-coverage [email or web address] Password [email or web address] 03/14/2015, 2:45 PM   LOS: 0 days

## 2015-03-14 NOTE — Progress Notes (Signed)
ANTICOAGULATION CONSULT NOTE - Initial Consult  Pharmacy Consult for Warfarin  Indication: atrial fibrillation  Patient Measurements: Height: 5\' 1"  (154.9 cm) Weight: 144 lb 2.9 oz (65.4 kg) IBW/kg (Calculated) : 47.8  Vital Signs: Temp: 98.1 F (36.7 C) (02/20 0220) Temp Source: Oral (02/20 0220) BP: 157/63 mmHg (02/20 0220) Pulse Rate: 71 (02/20 0220)  Labs:  Recent Labs  03/13/15 2039  HGB 8.2*  HCT 25.9*  PLT 199  LABPROT 64.8*  INR 8.14*  CREATININE 4.31*    Estimated Creatinine Clearance: 11.3 mL/min (by C-G formula based on Cr of 4.31).   Assessment: Pt here with dizziness, warfarin PTA, INR is supra-therapeutic at 8.14, was ~8 on 2/17 in the anti-coag clinic and instructions were to hold doses, Hgb 8.2, noted renal dysfunction.   Goal of Therapy:  INR 2-3 Monitor platelets by anticoagulation protocol: Yes   Plan:  -Hold warfarin  -Daily PT/INR, re-start warfarin as INR allows -Monitor for bleeding  Narda Bonds 03/14/2015,3:39 AM

## 2015-03-14 NOTE — Progress Notes (Signed)
Patient c/o pain in the upper abdomen, given oxycodone but after a while she said pain radiating to the jaw, did 12 lead EKG, which showed Normal sinus rhythm with arrthymia and left bundle branch block, refused to take Nitro even though explained to her that will be monitoring her BP and HR and is ok to take Nitro on top of oxycodone but she refused. After 10 minutes, she said she was feeling okay.  Dr. Carles Collet came to see her, she c/o chest pain this time, given Nitroglycerin x 1,( this time she was ok to take Nitro because it was the Dr who wanted to take the pill) and after 5 min she said her pain is 0.  Dr. Carles Collet was in the room all the time and took VS again BP 140/57, P 76.  Currently, Patient's granddaughter is here and patient is talking to her, no pain per patient.  Per Dr. Carles Collet, caridiology will be consulted.  Will continue to monitor her accordingly.

## 2015-03-14 NOTE — Progress Notes (Signed)
ANTIBIOTIC CONSULT NOTE - INITIAL  Pharmacy Consult for Vancomycin Indication: bacteremia  Allergies  Allergen Reactions  . Feraheme [Ferumoxytol] Other (See Comments)    Chest pain, pulsating 02/17/15: tolerated Nulecit  . Vancomycin Anaphylaxis, Itching, Swelling and Other (See Comments)    Tongue swell  . Dorzolamide Hcl-Timolol Mal Rash and Other (See Comments)    Eye pain  . Gentamycin [Gentamicin Sulfate] Itching and Swelling  . Latanoprost Rash and Other (See Comments)    Eye pain  . Cefazolin Itching  . Codeine Itching  . Hydrocodone-Acetaminophen Itching  . Penicillins Itching    Has patient had a PCN reaction causing immediate rash, facial/tongue/throat swelling, SOB or lightheadedness with hypotension:No Has patient had a PCN reaction causing severe rash involving mucus membranes or skin necrosis:No Has patient had a PCN reaction that required hospitalization:No Has patient had a PCN reaction occurring within the last 10 years:No If all of the above answers are "NO", then may proceed with Cephalosporin use.     Patient Measurements: Height: 5\' 1"  (154.9 cm) Weight: 145 lb 15.1 oz (66.2 kg) IBW/kg (Calculated) : 47.8 Adjusted Body Weight:   Vital Signs: Temp: 98.4 F (36.9 C) (02/20 2047) Temp Source: Oral (02/20 2047) BP: 160/71 mmHg (02/20 2047) Pulse Rate: 72 (02/20 2047) Intake/Output from previous day: 02/19 0701 - 02/20 0700 In: 2000 [IV Piggyback:2000] Out: 0  Intake/Output from this shift: Total I/O In: 335 [Blood:335] Out: 150 [Urine:150]  Labs:  Recent Labs  03/13/15 2039 03/14/15 0426 03/14/15 0730 03/14/15 1139 03/14/15 1503  WBC 10.1 8.0  --   --   --   HGB 8.2* 7.4*  --   --   --   PLT 199 174  --   --   --   CREATININE 4.31* 4.32* 4.06* 4.10* 4.03*   Estimated Creatinine Clearance: 12.1 mL/min (by C-G formula based on Cr of 4.03). No results for input(s): VANCOTROUGH, VANCOPEAK, VANCORANDOM, GENTTROUGH, GENTPEAK, GENTRANDOM,  TOBRATROUGH, TOBRAPEAK, TOBRARND, AMIKACINPEAK, AMIKACINTROU, AMIKACIN in the last 72 hours.   Microbiology:   Medical History: Past Medical History  Diagnosis Date  . Kidney transplant as cause of abnormal reaction or later complication   . Hypertension   . Colon polyps   . Gall stones 1980  . Anti-phospholipid antibody syndrome [facility])     (Based on hospital discharge summary march 2013)  . Paroxysmal atrial fibrillation (HCC)   . Carpal tunnel syndrome 2003    lt  . Esophageal stricture   . Hemorrhoids   . Renal disorder   . ESRD (end stage renal disease) on dialysis [facility])     "til I got my transplant in 2010"  . DVT (deep venous thrombosis) (HCC) 1990    LLE  . History of blood transfusion     "related to the lupus"  . Dyslipidemia 07/22/2013  . Coronary artery disease   . Heart murmur     mild MR on echo  . Pneumonia     "3 times now" (08/25/2013)  . GERD (gastroesophageal reflux disease)   . Arthritis     "fingers" (08/25/2013)  . Glaucoma, left eye   . Bilateral carotid artery stenosis     1-39%  . Pulmonary HTN (HCC)     PASP 80mmHg    Assessment:  ID: WBC nml, afeb. No abx. 2/20 PM, BC now with GPC x 1. acute on chronic renal failure Tmax 99.3. Currently afebrile. WBC 8.  Zyvox 2/20>>  2/20 BC x 1: GPC x1  Goal of Therapy:  Vancomycin trough level 15-20 mcg/ml  Plan:  Zyvox 600mg  IV q12h F/u BC to d/c Zyvox if contaminant   Elen Acero S. Alford Highland, PharmD, BCPS Clinical Staff Pharmacist Pager [phone]  Eilene Ghazi Stillinger 03/14/2015,10:14 PM

## 2015-03-14 NOTE — Progress Notes (Signed)
Initial Nutrition Assessment  INTERVENTION:   - Provide Ensure Enlive po BID, each supplement provides 350 kcal and 20 grams of protein. - Encourage good po intake during meals.  NUTRITION DIAGNOSIS:   Inadequate oral intake related to poor appetite as evidenced by per patient/family report, meal completion < 50%.  GOAL:   Patient will meet greater than or equal to 90% of their needs  MONITOR:   PO intake, Supplement acceptance, Labs, Weight trends  REASON FOR ASSESSMENT:   Malnutrition Screening Tool    ASSESSMENT:   65 y.o. female with hx of renal transplant with stage III renal disease, who was recently admitted for acute on chronic renal disease with non-ST elevation MI.  Pt presents complaining of dizziness and weakness since yesterday morning. States that over the last few days pt has not been eating well. Denies any vomiting, abdominal pain, but has had some constipation.   Patient reports a fluctuating appetite for the past few months.  States that she has eaten poorly the past week with minimal intake due to no appetite, but says her appetite is starting to return.  States that she ate all of her eggs and oatmeal for breakfast; however, meal completion record documents 0% completed.  Pt had not consumed anything off of lunch tray.  Stated that she was saving it for later, but she told her granddaughter (present during visit) that she did not want it.    No recent wt loss reported.  Per chart review, patient has maintained weight for the 6 months.  Nutrition Focused Physical Exam was conducted.  Findings include mild fat depletion in the upper arms, mild muscle depletion in the clavicle/acromion bone area, and no edema.  All other areas appeared well nourished.    Medications reviewed and include: calcitriol, Lipitor, Coreg, magnesium oxide, sodium bicarbonate, warfarin.  Labs reviewed and include: low sodium (130), elevated BUN and creatinine, glucose (104 - 124).  Pt  amenable to Ensure Enlive po BID to provide additional kcal and protein to help meet her needs.   Diet Order:  Diet Heart Room service appropriate?: Yes; Fluid consistency:: Thin  Skin:  Wound (see comment) (4 open red sores on sacrum)  Last BM:  2/16  Height:   Ht Readings from Last 1 Encounters:  03/14/15 5\' 1"  (1.549 m)    Weight:   Wt Readings from Last 1 Encounters:  03/14/15 144 lb 2.9 oz (65.4 kg)    Ideal Body Weight:  47.7 kg  BMI:  Body mass index is 27.26 kg/(m^2).  Estimated Nutritional Needs:   Kcal:  1700-1900  Protein:  75-85 grams  Fluid:  1.7 - 1.9 L  EDUCATION NEEDS:   Education needs addressed  Veronda Prude, Dietetic Intern Pager: [phone]

## 2015-03-14 NOTE — H&P (Addendum)
[facility] History and Physical  Debbie [name] [id number] DOB: 06-11-1950 DOA: 03/13/2015  Referring physician: Yvone Neu. PCP: No PCP Per Patient  Specialists: Nephrologist.  Chief Complaint: Weakness.  HPI: Debbie [name] is a 65 y.o. female with history of renal transplant with stage III renal disease, who was recently admitted for acute on chronic renal disease with non-ST elevation MI and at that time 2-D echo done showed EF of 40-45%, paroxysmal atrial fibrillation on Coumadin and metoprolol was brought to the ER after patient was complaining of dizziness and weakness since yesterday morning. Patient states that over the last few days patient has not been eating well. Denies any vomiting. Denies any abdominal pain and has been having some constipation. Patient otherwise denies any chest pain or shortness of breath. In the ER patient blood pressures found to be in the low normal and was given fluid bolus. Lab work revealed worsening renal function with creatinine around 4 and patient's baseline creatinine is around 2.7-2.9. In addition sodium was also in the lower side. Patient has been admitted for further management of acute on chronic renal failure with INR markedly elevated.    Review of Systems: As presented in the history of presenting illness, rest negative.  Past Medical History  Diagnosis Date  . Kidney transplant as cause of abnormal reaction or later complication   . Hypertension   . Colon polyps   . Gall stones 1980  . Anti-phospholipid antibody syndrome [facility])     (Based on hospital discharge summary march 2013)  . Paroxysmal atrial fibrillation (HCC)   . Carpal tunnel syndrome 2003    lt  . Esophageal stricture   . Hemorrhoids   . Renal disorder   . ESRD (end stage renal disease) on dialysis [facility])     "til I got my transplant in 2010"  . DVT (deep venous thrombosis) (HCC) 1990    LLE  . History of blood transfusion     "related to the lupus"  .  Dyslipidemia 07/22/2013  . Coronary artery disease   . Heart murmur     mild MR on echo  . Pneumonia     "3 times now" (08/25/2013)  . GERD (gastroesophageal reflux disease)   . Arthritis     "fingers" (08/25/2013)  . Glaucoma, left eye   . Bilateral carotid artery stenosis     1-39%  . Pulmonary HTN (HCC)     PASP 33mmHg   Past Surgical History  Procedure Laterality Date  . Nephrectomy transplanted organ  05/2008  . Cholecystectomy  1980  . Hematoma evacuation  1998    "after they took peritoneal catheter out"  . Peritoneal catheter insertion    . Peritoneal catheter removal    . Carpal tunnel release Right 07/2001  . Arteriovenous graft placement    . Thrombectomy and revision of arterioventous (av) goretex  graft Right 07/1999; 04/2002; 09/2002; 12/2005; 09/2007;     upper arm/notes 07/31/1999; 05/18/2002; 10/07/2002; 01/14/2006; 09/14/2007;   . Right heart catheterization N/A 08/28/2013    Procedure: RIGHT HEART CATH;  Surgeon: Sinclair Grooms, MD;  Location: [facility] CATH LAB;  Service: Cardiovascular;  Laterality: N/A;   Social History:  reports that she quit smoking about 37 years ago. Her smoking use included Cigarettes. She smoked 0.00 packs per day for 30 years. She has never used smokeless tobacco. She reports that she drinks alcohol. She reports that she does not use illicit drugs. Where does patient live home. Can patient  participate in ADLs? Yes.  Allergies  Allergen Reactions  . Feraheme [Ferumoxytol] Other (See Comments)    Chest pain, pulsating 02/17/15: tolerated Nulecit  . Vancomycin Anaphylaxis, Itching, Swelling and Other (See Comments)    Tongue swell  . Dorzolamide Hcl-Timolol Mal Rash and Other (See Comments)    Eye pain  . Gentamycin [Gentamicin Sulfate] Itching and Swelling  . Latanoprost Rash and Other (See Comments)    Eye pain  . Cefazolin Itching  . Codeine Itching  . Hydrocodone-Acetaminophen Itching  . Penicillins Itching    Has patient had a PCN reaction  causing immediate rash, facial/tongue/throat swelling, SOB or lightheadedness with hypotension:No Has patient had a PCN reaction causing severe rash involving mucus membranes or skin necrosis:No Has patient had a PCN reaction that required hospitalization:No Has patient had a PCN reaction occurring within the last 10 years:No If all of the above answers are "NO", then may proceed with Cephalosporin use.     Family History:  Family History  Problem Relation Age of Onset  . Hypertension Mother   . Heart disease Mother   . Colon cancer Neg Hx   . Rectal cancer Neg Hx   . Stomach cancer Neg Hx       Prior to Admission medications   Medication Sig Start Date End Date Taking? Authorizing Provider  acetaminophen (TYLENOL) 325 MG tablet Take 2 tablets (650 mg total) by mouth every 6 (six) hours as needed for mild pain (or Fever >/= 101). 02/21/15  Yes Modena Jansky, MD  allopurinol (ZYLOPRIM) 100 MG tablet Take 2 tablets (200 mg total) by mouth daily. 02/21/15  Yes Modena Jansky, MD  atorvastatin (LIPITOR) 40 MG tablet Take 1 tablet (40 mg total) by mouth daily at 6 PM. 02/21/15  Yes Modena Jansky, MD  bimatoprost (LUMIGAN) 0.01 % SOLN Place 1 drop into both eyes at bedtime.  05/21/13  Yes Historical Provider, MD  Brinzolamide-Brimonidine (SIMBRINZA) 1-0.2 % SUSP Place 1 drop into the left eye 3 (three) times daily. 09/14/14  Yes Historical Provider, MD  calcitRIOL (ROCALTROL) 0.25 MCG capsule Take 0.25-0.5 mcg by mouth every other day. alternating dosages   Yes Historical Provider, MD  carvedilol (COREG) 6.25 MG tablet Take 1 tablet (6.25 mg total) by mouth 2 (two) times daily with a meal. 02/21/15  Yes Modena Jansky, MD  furosemide (LASIX) 40 MG tablet Take 1 tablet (40 mg total) by mouth daily. 02/21/15  Yes Modena Jansky, MD  isosorbide mononitrate (IMDUR) 60 MG 24 hr tablet Take 1 tablet (60 mg total) by mouth daily. 02/21/15  Yes Modena Jansky, MD  MAGNESIUM-OXIDE 400 (241.3 MG)  MG tablet TAKE 1 TABLET BY MOUTH TWICE DAILY 10/26/14  Yes Sueanne Margarita, MD  mycophenolate (MYFORTIC) 180 MG EC tablet Take 180 mg by mouth 2 (two) times daily.    Yes Historical Provider, MD  nitroGLYCERIN (NITROSTAT) 0.4 MG SL tablet Place 0.4 mg under the tongue every 5 (five) minutes as needed for chest pain.  08/26/13  Yes Historical Provider, MD  oxyCODONE-acetaminophen (PERCOCET/ROXICET) 5-325 MG tablet Take 1 tablet by mouth every 6 (six) hours as needed for moderate pain or severe pain. Patient taking differently: Take 0.5-1 tablets by mouth every 6 (six) hours as needed for moderate pain or severe pain.  02/21/15  Yes Modena Jansky, MD  predniSONE (DELTASONE) 5 MG tablet Take 5 mg by mouth daily with breakfast.  04/24/12  Yes Historical Provider, MD  RANEXA 500  MG 12 hr tablet TAKE 1 TABLET BY MOUTH TWICE DAILY 01/11/15  Yes Sueanne Margarita, MD  ranitidine (ZANTAC) 150 MG tablet Take 150 mg by mouth daily.    Yes Historical Provider, MD  sodium bicarbonate 650 MG tablet Take 1,300 mg by mouth 2 (two) times daily.  07/13/11  Yes Historical Provider, MD  tacrolimus (PROGRAF) 1 MG capsule Take 2 mg by mouth 2 (two) times daily.    Yes Historical Provider, MD  warfarin (COUMADIN) 2.5 MG tablet Take 0.5-1 tablets (1.25-2.5 mg total) by mouth daily at 6 PM. Monday and Friday 2.5 mg and 1.25 mg on all other days 02/21/15  Yes Modena Jansky, MD  labetalol (NORMODYNE) 100 MG tablet Take 100 mg by mouth 2 (two) times daily. Reported on 03/13/2015 12/15/14   Historical Provider, MD  methocarbamol (ROBAXIN) 500 MG tablet Take 1 tablet (500 mg total) by mouth 2 (two) times daily. Patient not taking: Reported on 03/03/2015 01/23/15   Marella Chimes, PA-C  timolol (TIMOPTIC) 0.5 % ophthalmic solution Place 1 drop into both eyes 2 (two) times daily. Reported on 03/13/2015 05/21/13   Historical Provider, MD    Physical Exam: Filed Vitals:   03/14/15 0030 03/14/15 0045 03/14/15 0115 03/14/15 0220  BP:  135/52 122/55 141/53 157/63  Pulse: 69 69 69 71  Temp:    98.1 F (36.7 C)  TempSrc:    Oral  Resp:    18  Height:    5\' 1"  (1.549 m)  Weight:    65.4 kg (144 lb 2.9 oz)  SpO2: 96% 98% 97% 99%     General:  Moderately built and nourished.  Eyes: Anicteric no pallor. Poor vision.  ENT: No discharge from the ears eyes nose or mouth.  Neck: No mass felt. No JVD appreciated.  Cardiovascular: S1-S2 heard.  Respiratory: No rhonchi or crepitations.  Abdomen: Soft nontender bowel sounds present.  Skin: No rash.  Musculoskeletal: No edema. Right ankle is in brace.  Psychiatric: Appears normal.  Neurologic: Alert awake oriented to time place and person. Poor vision.  Labs on Admission:  Basic Metabolic Panel:  Recent Labs Lab 03/13/15 2039  NA 123*  K 4.7  CL 90*  CO2 18*  GLUCOSE 115*  BUN 73*  CREATININE 4.31*  CALCIUM 8.8*   Liver Function Tests: No results for input(s): AST, ALT, ALKPHOS, BILITOT, PROT, ALBUMIN in the last 168 hours. No results for input(s): LIPASE, AMYLASE in the last 168 hours. No results for input(s): AMMONIA in the last 168 hours. CBC:  Recent Labs Lab 03/13/15 2039  WBC 10.1  HGB 8.2*  HCT 25.9*  MCV 81.4  PLT 199   Cardiac Enzymes: No results for input(s): CKTOTAL, CKMB, CKMBINDEX, TROPONINI in the last 168 hours.  BNP (last 3 results) No results for input(s): BNP in the last 8760 hours.  ProBNP (last 3 results) No results for input(s): PROBNP in the last 8760 hours.  CBG: No results for input(s): GLUCAP in the last 168 hours.  Radiological Exams on Admission: Ct Head Wo Contrast  03/14/2015  CLINICAL DATA:  Acute onset of dizziness.  Initial encounter. EXAM: CT HEAD WITHOUT CONTRAST TECHNIQUE: Contiguous axial images were obtained from the base of the skull through the vertex without intravenous contrast. COMPARISON:  CT of the head performed 02/14/2015 FINDINGS: There is no evidence of acute infarction, mass lesion, or  intra- or extra-axial hemorrhage on CT. Prominence of the ventricles sulci reflects mild cortical volume loss. The  brainstem and fourth ventricle are within normal limits. The basal ganglia are unremarkable in appearance. The cerebral hemispheres demonstrate grossly normal gray-white differentiation. No mass effect or midline shift is seen. There is no evidence of fracture; visualized osseous structures are unremarkable in appearance. The visualized portions of the orbits are within normal limits. The paranasal sinuses and mastoid air cells are well-aerated. No significant soft tissue abnormalities are seen. IMPRESSION: 1. No acute intracranial pathology seen on CT. 2. Mild cortical volume loss noted. Electronically Signed   By: Garald Balding M.D.   On: 03/14/2015 00:35   Dg Chest Portable 1 View  03/14/2015  CLINICAL DATA:  Fever and weakness tonight. EXAM: PORTABLE CHEST 1 VIEW COMPARISON:  Radiograph 02/18/2015.  Chest CT 02/16/2015 FINDINGS: Cardiomediastinal contours are unchanged allowing for patient rotation. Small left pleural effusion, diminished from prior. No evidence of right pleural effusion. Left basilar scarring. No pulmonary edema. No confluent airspace disease. No pneumothorax. IMPRESSION: Persistent but decreased left pleural effusion. Exam is otherwise unchanged. Electronically Signed   By: Jeb Levering M.D.   On: 03/14/2015 02:09     Assessment/Plan Principal Problem:   Acute renal failure superimposed on stage 3 chronic kidney disease (HCC) Active Problems:   PAF (paroxysmal atrial fibrillation) ([place])   Renal transplant recipient   Coagulopathy ([place])   ARF (acute renal failure) ([place])   1. Acute on chronic renal failure in a transplanted kidney with stage III kidney disease - probably secondary to dehydration. Will hold off Lasix and gently hydrate. Closely follow metabolic panel intake output. On sodium bicarbonate and calcitriol. 2. Coagulopathy secondary to Coumadin -  will repeat INR and if still elevated will give vitamin K 2.5 mg. Closely follow CBC. 3. Hyponatremia - probably from poor oral intake. Closely follow metabolic panel. Check urine studies including urine sodium and urine osmolarity. 4. History of renal transplant on immunosuppressants. Continue prednisone tacrolimus and mycophenolate. 5. Paroxysmal atrial fibrillation - presently rate controlled continue metoprolol. Chads 2 vascular score is around 2. Patient's on Coumadin see #2. 6. Systolic heart failure with recent non-ST elevation MI being conservatively managed - EF was 40-45% in January 2016 last month. Denies any chest pain at this time. 7. Chronic anemia with mild worsening - closely follow CBC. 8. Hypertension - on labetalol Coreg Imdur which may have to be held if patient's blood pressure does not improve. 9. History of gout on allopurinol. Closely follow metabolic panel and if creatinine does not improve then we have to decrease or stop allopurinol. 10. HLD on statins. 11. Right ankle fracture - following up with Dr.Blackman.   DVT Prophylaxis Coumadin.  Code Status: Full code.  Family Communication: Discussed with patient.  Disposition Plan: Admit to inpatient.    Skylee Baird N. [facility] Pager [phone].  If 7PM-7AM, please contact night-coverage [email or web address] Password [facility] [place] 03/14/2015, 3:27 AM

## 2015-03-14 NOTE — Consult Note (Signed)
Reason for Consult:   CAD  Requesting Physician: Dr Tat Primary Cardiologist Dr Julianne Handler  HPI:   65 year old female patient with history of renal [name] 2010 HTN, antiphospholipid antibody syndrome with hypercoagulable and 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 Jacques Navy appointment 02/16/15). She was was recently discharged from the hospital on 02/21/2015 after treatment for NSTEMI and acute on chronic renal failure. During that admission she was felt to be dehydrated. With hydration she had respiratory failure and a NSTEMI with a Troponin of 13. Echo 02/15/15 showed an EF of 40-45%. Cath was considered but it was deferred secondary to improvement in her symptoms and her baseline renal insufficiency. She had a f/u but didn't keep it.           She presents now with weakness, a bump in her SCr to 4.3, and an INR of 8.43. Sh denied any chest pain to me. She apparently lives in her own home. There was a family member present but they did not offer much to the interview. Her Troponin is 0.05. EKG shows NSR, PVCs LBBB.       Marland Kitchen  Past Medical History  Diagnosis Date  . Kidney [name] as cause of abnormal reaction or later complication   . Hypertension   . Colon polyps   . Gall stones 1980  . Anti-phospholipid antibody syndrome [facility])     (Based on hospital discharge summary march 2013)  . Paroxysmal atrial fibrillation (HCC)   . Carpal tunnel syndrome 2003    lt  . Esophageal stricture   . Hemorrhoids   . Renal disorder   . ESRD (end stage renal disease) on dialysis [facility])     "til I got my [name] in 2010"  . DVT (deep venous thrombosis) (HCC) 1990    LLE  . History of blood transfusion     "related  to the lupus"  . Dyslipidemia 07/22/2013  . Coronary artery disease   . Heart murmur     mild MR on echo  . Pneumonia     "3 times now" (08/25/2013)  . GERD (gastroesophageal reflux disease)   . Arthritis     "fingers" (08/25/2013)  . Glaucoma, left eye   . Bilateral carotid artery stenosis     1-39%  . Pulmonary HTN (HCC)     PASP 27mmHg    Past Surgical History  Procedure Laterality Date  . Nephrectomy transplanted organ  05/2008  . Cholecystectomy  1980  . Hematoma evacuation  1998    "after they took peritoneal catheter out"  . Peritoneal catheter insertion    . Peritoneal catheter removal    . Carpal tunnel release Right 07/2001  . Arteriovenous graft placement    . Thrombectomy and revision of arterioventous (av) goretex  graft Right 07/1999; 04/2002; 09/2002; 12/2005; 09/2007;     upper arm/notes 07/31/1999; 05/18/2002; 10/07/2002; 01/14/2006; 09/14/2007;   . Right heart catheterization N/A 08/28/2013    Procedure: RIGHT HEART CATH;  Surgeon: Sinclair Grooms, MD;  Location: [facility] CATH LAB;  Service: Cardiovascular;  Laterality: N/A;    SOCHx:  reports that she quit smoking about 37 years ago. Her smoking use included Cigarettes. She smoked 0.00 packs per day for 30 years. She has never  used smokeless tobacco. She reports that she drinks alcohol. She reports that she does not use illicit drugs.  FAMHx: Family History  Problem Relation Age of Onset  . Hypertension Mother   . Heart disease Mother   . Colon cancer Neg Hx   . Rectal cancer Neg Hx   . Stomach cancer Neg Hx     ALLERGIES: Allergies  Allergen Reactions  . Feraheme [Ferumoxytol] Other (See Comments)    Chest pain, pulsating 02/17/15: tolerated Nulecit  . Vancomycin Anaphylaxis, Itching, Swelling and Other (See Comments)    Tongue swell  . Dorzolamide Hcl-Timolol Mal Rash and Other (See Comments)    Eye pain  . Gentamycin [Gentamicin Sulfate] Itching and Swelling  . Latanoprost Rash and Other (See Comments)    Eye  pain  . Cefazolin Itching  . Codeine Itching  . Hydrocodone-Acetaminophen Itching  . Penicillins Itching    Has patient had a PCN reaction causing immediate rash, facial/tongue/throat swelling, SOB or lightheadedness with hypotension:No Has patient had a PCN reaction causing severe rash involving mucus membranes or skin necrosis:No Has patient had a PCN reaction that required hospitalization:No Has patient had a PCN reaction occurring within the last 10 years:No If all of the above answers are "NO", then may proceed with Cephalosporin use.     ROS: Review of Systems: General: negative for chills, fever, night sweats or weight changes.  Cardiovascular: negative for chest pain, dyspnea on exertion, edema, orthopnea, palpitations, paroxysmal nocturnal dyspnea or shortness of breath HEENT: negative for any visual disturbances, blindness, glaucoma Dermatological: negative for rash Respiratory: negative for cough, hemoptysis, or wheezing Urologic: negative for hematuria or dysuria Abdominal: negative for nausea, vomiting, diarrhea, bright red blood per rectum, melena, or hematemesis Neurologic: negative for visual changes, syncope, or dizziness Musculoskeletal: negative for back pain, joint pain, or swelling Psych: cooperative and appropriate All other systems reviewed and are otherwise negative except as noted above.   HOME MEDICATIONS: Prior to Admission medications   Medication Sig Start Date End Date Taking? Authorizing Provider  acetaminophen (TYLENOL) 325 MG tablet Take 2 tablets (650 mg total) by mouth every 6 (six) hours as needed for mild pain (or Fever >/= 101). 02/21/15  Yes Modena Jansky, MD  allopurinol (ZYLOPRIM) 100 MG tablet Take 2 tablets (200 mg total) by mouth daily. 02/21/15  Yes Modena Jansky, MD  atorvastatin (LIPITOR) 40 MG tablet Take 1 tablet (40 mg total) by mouth daily at 6 PM. 02/21/15  Yes Modena Jansky, MD  bimatoprost (LUMIGAN) 0.01 % SOLN Place 1 drop  into both eyes at bedtime.  05/21/13  Yes Historical Provider, MD  Brinzolamide-Brimonidine (SIMBRINZA) 1-0.2 % SUSP Place 1 drop into the left eye 3 (three) times daily. 09/14/14  Yes Historical Provider, MD  calcitRIOL (ROCALTROL) 0.25 MCG capsule Take 0.25-0.5 mcg by mouth every other day. alternating dosages   Yes Historical Provider, MD  carvedilol (COREG) 6.25 MG tablet Take 1 tablet (6.25 mg total) by mouth 2 (two) times daily with a meal. 02/21/15  Yes Modena Jansky, MD  furosemide (LASIX) 40 MG tablet Take 1 tablet (40 mg total) by mouth daily. 02/21/15  Yes Modena Jansky, MD  isosorbide mononitrate (IMDUR) 60 MG 24 hr tablet Take 1 tablet (60 mg total) by mouth daily. 02/21/15  Yes Modena Jansky, MD  MAGNESIUM-OXIDE 400 (241.3 MG) MG tablet TAKE 1 TABLET BY MOUTH TWICE DAILY 10/26/14  Yes Sueanne Margarita, MD  mycophenolate (MYFORTIC) 180 MG EC tablet  Take 180 mg by mouth 2 (two) times daily.    Yes Historical Provider, MD  nitroGLYCERIN (NITROSTAT) 0.4 MG SL tablet Place 0.4 mg under the tongue every 5 (five) minutes as needed for chest pain.  08/26/13  Yes Historical Provider, MD  oxyCODONE-acetaminophen (PERCOCET/ROXICET) 5-325 MG tablet Take 1 tablet by mouth every 6 (six) hours as needed for moderate pain or severe pain. Patient taking differently: Take 0.5-1 tablets by mouth every 6 (six) hours as needed for moderate pain or severe pain.  02/21/15  Yes Modena Jansky, MD  predniSONE (DELTASONE) 5 MG tablet Take 5 mg by mouth daily with breakfast.  04/24/12  Yes Historical Provider, MD  RANEXA 500 MG 12 hr tablet TAKE 1 TABLET BY MOUTH TWICE DAILY 01/11/15  Yes Sueanne Margarita, MD  ranitidine (ZANTAC) 150 MG tablet Take 150 mg by mouth daily.    Yes Historical Provider, MD  sodium bicarbonate 650 MG tablet Take 1,300 mg by mouth 2 (two) times daily.  07/13/11  Yes Historical Provider, MD  tacrolimus (PROGRAF) 1 MG capsule Take 2 mg by mouth 2 (two) times daily.    Yes Historical Provider,  MD  warfarin (COUMADIN) 2.5 MG tablet Take 0.5-1 tablets (1.25-2.5 mg total) by mouth daily at 6 PM. Monday and Friday 2.5 mg and 1.25 mg on all other days 02/21/15  Yes Modena Jansky, MD  labetalol (NORMODYNE) 100 MG tablet Take 100 mg by mouth 2 (two) times daily. Reported on 03/13/2015 12/15/14   Historical Provider, MD  methocarbamol (ROBAXIN) 500 MG tablet Take 1 tablet (500 mg total) by mouth 2 (two) times daily. Patient not taking: Reported on 03/03/2015 01/23/15   Marella Chimes, PA-C  timolol (TIMOPTIC) 0.5 % ophthalmic solution Place 1 drop into both eyes 2 (two) times daily. Reported on 03/13/2015 05/21/13   Historical Provider, MD    HOSPITAL MEDICATIONS: I have reviewed the patient's current medications.  VITALS: Blood pressure 140/Debbie, pulse 76, temperature 98.2 F (36.8 C), temperature source Oral, resp. rate 18, height 5\' 1"  (1.549 m), weight 144 lb 2.9 oz (65.4 kg), SpO2 98 %.  PHYSICAL EXAM: General appearance: alert, cooperative, no distress and moderately obese Neck: no carotid bruit and no JVD Lungs: clear to auscultation bilaterally Heart: regular rate and rhythm and frequent extra systole Abdomen: soft, non-tender; bowel sounds normal; no masses,  no organomegaly Extremities: RUE AVF with continuous bruit, boot on Rt LE Pulses: diminnished Skin: cool and dry Neurologic: Grossly normal  LABS: Results for orders placed or performed during the hospital encounter of 03/13/15 (from the past 24 hour(s))  CBC     Status: Abnormal   Collection Time: 03/13/15  8:39 PM  Result Value Ref Range   WBC 10.1 4.0 - 10.5 K/uL   RBC 3.18 (L) 3.87 - 5.11 MIL/uL   Hemoglobin 8.2 (L) 12.0 - 15.0 g/dL   HCT 25.9 (L) 36.0 - 46.0 %   MCV 81.4 78.0 - 100.0 fL   MCH 25.8 (L) 26.0 - 34.0 pg   MCHC 31.7 30.0 - 36.0 g/dL   RDW 16.1 (H) 11.5 - 15.5 %   Platelets 199 150 - 400 K/uL  Basic metabolic panel     Status: Abnormal   Collection Time: 03/13/15  8:39 PM  Result Value Ref  Range   Sodium 123 (L) 135 - 145 mmol/L   Potassium 4.7 3.5 - 5.1 mmol/L   Chloride 90 (L) 101 - 111 mmol/L   CO2 18 (L)  22 - 32 mmol/L   Glucose, Bld 115 (H) 65 - 99 mg/dL   BUN 73 (H) 6 - 20 mg/dL   Creatinine, Ser 4.31 (H) 0.44 - 1.00 mg/dL   Calcium 8.8 (L) 8.9 - 10.3 mg/dL   GFR calc non Af Amer 10 (L) >60 mL/min   GFR calc Af Amer 11 (L) >60 mL/min   Anion gap 15 5 - 15  Protime-INR     Status: Abnormal   Collection Time: 03/13/15  8:39 PM  Result Value Ref Range   Prothrombin Time 64.8 (H) 11.6 - 15.2 seconds   INR 8.14 (HH) 0.00 - 1.49  Troponin I     Status: Abnormal   Collection Time: 03/14/15  4:26 AM  Result Value Ref Range   Troponin I 0.05 (H) <0.031 ng/mL  Magnesium     Status: None   Collection Time: 03/14/15  4:26 AM  Result Value Ref Range   Magnesium 2.2 1.7 - 2.4 mg/dL  CBC with Differential/Platelet     Status: Abnormal   Collection Time: 03/14/15  4:26 AM  Result Value Ref Range   WBC 8.0 4.0 - 10.5 K/uL   RBC 2.80 (L) 3.87 - 5.11 MIL/uL   Hemoglobin 7.4 (L) 12.0 - 15.0 g/dL   HCT 22.7 (L) 36.0 - 46.0 %   MCV 81.1 78.0 - 100.0 fL   MCH 26.4 26.0 - 34.0 pg   MCHC 32.6 30.0 - 36.0 g/dL   RDW 16.0 (H) 11.5 - 15.5 %   Platelets 174 150 - 400 K/uL   Neutrophils Relative % 75 %   Neutro Abs 6.0 1.7 - 7.7 K/uL   Lymphocytes Relative 19 %   Lymphs Abs 1.5 0.7 - 4.0 K/uL   Monocytes Relative 6 %   Monocytes Absolute 0.4 0.1 - 1.0 K/uL   Eosinophils Relative 0 %   Eosinophils Absolute 0.0 0.0 - 0.7 K/uL   Basophils Relative 0 %   Basophils Absolute 0.0 0.0 - 0.1 K/uL  Type and screen [place]     Status: None   Collection Time: 03/14/15  4:26 AM  Result Value Ref Range   ABO/RH(D) B POS    Antibody Screen NEG    Sample Expiration 0000000   Basic metabolic panel     Status: Abnormal   Collection Time: 03/14/15  4:26 AM  Result Value Ref Range   Sodium 130 (L) 135 - 145 mmol/L   Potassium 5.1 3.5 - 5.1 mmol/L   Chloride 101  101 - 111 mmol/L   CO2 21 (L) 22 - 32 mmol/L   Glucose, Bld 124 (H) 65 - 99 mg/dL   BUN 73 (H) 6 - 20 mg/dL   Creatinine, Ser 4.32 (H) 0.44 - 1.00 mg/dL   Calcium 9.0 8.9 - 10.3 mg/dL   GFR calc non Af Amer 10 (L) >60 mL/min   GFR calc Af Amer 11 (L) >60 mL/min   Anion gap 8 5 - 15  Protime-INR     Status: Abnormal   Collection Time: 03/14/15  4:26 AM  Result Value Ref Range   Prothrombin Time 66.5 (H) 11.6 - 15.2 seconds   INR 8.43 (HH) 0.00 - 99991111  Basic metabolic panel     Status: Abnormal   Collection Time: 03/14/15  7:30 AM  Result Value Ref Range   Sodium 132 (L) 135 - 145 mmol/L   Potassium 5.0 3.5 - 5.1 mmol/L   Chloride 102 101 - 111 mmol/L  CO2 19 (L) 22 - 32 mmol/L   Glucose, Bld 108 (H) 65 - 99 mg/dL   BUN 71 (H) 6 - 20 mg/dL   Creatinine, Ser 4.06 (H) 0.44 - 1.00 mg/dL   Calcium 8.8 (L) 8.9 - 10.3 mg/dL   GFR calc non Af Amer 11 (L) >60 mL/min   GFR calc Af Amer 12 (L) >60 mL/min   Anion gap 11 5 - 15  Troponin I (q 6hr x 3)     Status: Abnormal   Collection Time: 03/14/15  7:30 AM  Result Value Ref Range   Troponin I 0.04 (H) <0.031 ng/mL  Basic metabolic panel     Status: Abnormal   Collection Time: 03/14/15 11:39 AM  Result Value Ref Range   Sodium 130 (L) 135 - 145 mmol/L   Potassium 4.9 3.5 - 5.1 mmol/L   Chloride 101 101 - 111 mmol/L   CO2 19 (L) 22 - 32 mmol/L   Glucose, Bld 104 (H) 65 - 99 mg/dL   BUN 69 (H) 6 - 20 mg/dL   Creatinine, Ser 4.10 (H) 0.44 - 1.00 mg/dL   Calcium 9.1 8.9 - 10.3 mg/dL   GFR calc non Af Amer 11 (L) >60 mL/min   GFR calc Af Amer 12 (L) >60 mL/min   Anion gap 10 5 - 15  Troponin I (q 6hr x 3)     Status: Abnormal   Collection Time: 03/14/15 11:39 AM  Result Value Ref Range   Troponin I 0.05 (H) <0.031 ng/mL    EKG: NSR, PVCs. LBBB  IMAGING: Ct Head Wo Contrast  03/14/2015  CLINICAL DATA:  Acute onset of dizziness.  Initial encounter. EXAM: CT HEAD WITHOUT CONTRAST TECHNIQUE: Contiguous axial images were obtained  from the base of the skull through the vertex without intravenous contrast. COMPARISON:  CT of the head performed 02/14/2015 FINDINGS: There is no evidence of acute infarction, mass lesion, or intra- or extra-axial hemorrhage on CT. Prominence of the ventricles sulci reflects mild cortical volume loss. The brainstem and fourth ventricle are within normal limits. The basal ganglia are unremarkable in appearance. The cerebral hemispheres demonstrate grossly normal gray-white differentiation. No mass effect or midline shift is seen. There is no evidence of fracture; visualized osseous structures are unremarkable in appearance. The visualized portions of the orbits are within normal limits. The paranasal sinuses and mastoid air cells are well-aerated. No significant soft tissue abnormalities are seen. IMPRESSION: 1. No acute intracranial pathology seen on CT. 2. Mild cortical volume loss noted. Electronically Signed   By: Garald Balding M.D.   On: 03/14/2015 00:35   Dg Chest Portable 1 View  03/14/2015  CLINICAL DATA:  Fever and weakness tonight. EXAM: PORTABLE CHEST 1 VIEW COMPARISON:  Radiograph 02/18/2015.  Chest CT 02/16/2015 FINDINGS: Cardiomediastinal contours are unchanged allowing for patient rotation. Small left pleural effusion, diminished from prior. No evidence of right pleural effusion. Left basilar scarring. No pulmonary edema. No confluent airspace disease. No pneumothorax. IMPRESSION: Persistent but decreased left pleural effusion. Exam is otherwise unchanged. Electronically Signed   By: Jeb Levering M.D.   On: 03/14/2015 02:09    IMPRESSION: Principal Problem:   Acute renal failure superimposed on stage 4 chronic kidney disease (HCC) Active Problems:   Supratherapeutic INR   CKD (chronic kidney disease), stage IV (HCC)   Chronic anticoagulation - with Coumadin   Pulmonary hypertension [facility])   NSTEMI- Jan 2017- medical Rx   Renal [name] recipient   Coagulopathy [facility])  Cardiomyopathy, ischemic-40-45%   PAF (paroxysmal atrial fibrillation) (HCC)   HTN (hypertension)   Dyslipidemia   LBBB (left bundle branch block)   RECOMMENDATION: Consider transfusion- MD to see. Currently on Imdur, Coreg, Ranexa, and Lipitor. Coumadin on hold.   Time Spent Directly with Patient: 88 minutes  Kerin Ransom, [place] beeper 03/14/2015, 3:Debbie PM   I have personally seen and examined this patient with Kerin Ransom, PA-C. I agree with the assessment and plan as outlined above. She has had prior admissions for ACS but no cath due to her CKD. She is now admitted with weakness and found to have anemia and supra-therapeutic INR. NO bleeding source. She is having jaw pain today which may be an anginal equivalent but she is not a good candidate for invasive evaluation given the INR of 8.4, creatinine over 4.0 and anemia. Her EKG shows LBBB which has been noted on past EKG's over the last year. Would attempt medical management at this time. She may benefit from transfusion pRBCs as her anemia is likely contributing to ischemia. Would attempt pain control today with NTG and morphine. Will write for NTG drip. We will follow with you.   MCALHANY,CHRISTOPHER 03/14/2015 4:17 PM

## 2015-03-14 NOTE — ED Provider Notes (Signed)
CSN: [id number]     Arrival date & time 03/13/15  1938 History   First MD Initiated Contact with Patient 03/13/15 1947     Chief Complaint  Patient presents with  . Dizziness      HPI Pt began feeling lightheaded and "dizzy" early today. Reports nausea without vomiting or diarrhea. No fever today. Reportedly had fever earlier in the week. No urinary complaints.  Renal transplant recipient 6 yrs ago. No abdominal pain. No back pain or flank pain.   Past Medical History  Diagnosis Date  . Kidney transplant as cause of abnormal reaction or later complication   . Hypertension   . Colon polyps   . Gall stones 1980  . Anti-phospholipid antibody syndrome [facility])     (Based on hospital discharge summary march 2013)  . Paroxysmal atrial fibrillation (HCC)   . Carpal tunnel syndrome 2003    lt  . Esophageal stricture   . Hemorrhoids   . Renal disorder   . ESRD (end stage renal disease) on dialysis [facility])     "til I got my transplant in 2010"  . DVT (deep venous thrombosis) (HCC) 1990    LLE  . History of blood transfusion     "related to the lupus"  . Dyslipidemia 07/22/2013  . Coronary artery disease   . Heart murmur     mild MR on echo  . Pneumonia     "3 times now" (08/25/2013)  . GERD (gastroesophageal reflux disease)   . Arthritis     "fingers" (08/25/2013)  . Glaucoma, left eye   . Bilateral carotid artery stenosis     1-39%  . Pulmonary HTN (HCC)     PASP 24mmHg   Past Surgical History  Procedure Laterality Date  . Nephrectomy transplanted organ  05/2008  . Cholecystectomy  1980  . Hematoma evacuation  1998    "after they took peritoneal catheter out"  . Peritoneal catheter insertion    . Peritoneal catheter removal    . Carpal tunnel release Right 07/2001  . Arteriovenous graft placement    . Thrombectomy and revision of arterioventous (av) goretex  graft Right 07/1999; 04/2002; 09/2002; 12/2005; 09/2007;     upper arm/notes 07/31/1999; 05/18/2002; 10/07/2002; 01/14/2006;  09/14/2007;   . Right heart catheterization N/A 08/28/2013    Procedure: RIGHT HEART CATH;  Surgeon: Sinclair Grooms, MD;  [facility] C[facility] LAB;  Service: Cardiovascular;  Laterality: N/A;   Family History  Problem Relation Age of Onset  . Hypertension Mother   . Heart disease Mother   . Colon cancer Neg Hx   . Rectal cancer Neg Hx   . Stomach cancer Neg Hx    Social History  Substance Use Topics  . Smoking status: Former Smoker -- 0.00 packs/day for 30 years    Types: Cigarettes    Quit date: 04/16/1977  . Smokeless tobacco: Never Used  . Alcohol Use: Yes   OB History    Gravida Para Term Preterm AB TAB SAB Ectopic Multiple Living   1              Review of Systems  All other systems reviewed and are negative.     Allergies  Feraheme; Vancomycin; Dorzolamide hcl-timolol mal; Gentamycin; Latanoprost; Cefazolin; Codeine; Hydrocodone-acetaminophen; and Penicillins  Home Medications   Prior to Admission medications   Medication Sig Start Date End Date Taking? Authorizing Provider  acetaminophen (TYLENOL) 325 MG tablet Take 2 tablets (650 mg total) by mouth every  6 (six) hours as needed for mild pain (or Fever >/= 101). 02/21/15  Yes Modena Jansky, MD  allopurinol (ZYLOPRIM) 100 MG tablet Take 2 tablets (200 mg total) by mouth daily. 02/21/15  Yes Modena Jansky, MD  atorvastatin (LIPITOR) 40 MG tablet Take 1 tablet (40 mg total) by mouth daily at 6 PM. 02/21/15  Yes Modena Jansky, MD  bimatoprost (LUMIGAN) 0.01 % SOLN Place 1 drop into both eyes at bedtime.  05/21/13  Yes Historical Provider, MD  Brinzolamide-Brimonidine (SIMBRINZA) 1-0.2 % SUSP Place 1 drop into the left eye 3 (three) times daily. 09/14/14  Yes Historical Provider, MD  calcitRIOL (ROCALTROL) 0.25 MCG capsule Take 0.25-0.5 mcg by mouth every other day. alternating dosages   Yes Historical Provider, MD  carvedilol (COREG) 6.25 MG tablet Take 1 tablet (6.25 mg total) by mouth 2 (two) times daily with a meal.  02/21/15  Yes Modena Jansky, MD  furosemide (LASIX) 40 MG tablet Take 1 tablet (40 mg total) by mouth daily. 02/21/15  Yes Modena Jansky, MD  isosorbide mononitrate (IMDUR) 60 MG 24 hr tablet Take 1 tablet (60 mg total) by mouth daily. 02/21/15  Yes Modena Jansky, MD  MAGNESIUM-OXIDE 400 (241.3 MG) MG tablet TAKE 1 TABLET BY MOUTH TWICE DAILY 10/26/14  Yes Sueanne Margarita, MD  mycophenolate (MYFORTIC) 180 MG EC tablet Take 180 mg by mouth 2 (two) times daily.    Yes Historical Provider, MD  nitroGLYCERIN (NITROSTAT) 0.4 MG SL tablet Place 0.4 mg under the tongue every 5 (five) minutes as needed for chest pain.  08/26/13  Yes Historical Provider, MD  oxyCODONE-acetaminophen (PERCOCET/ROXICET) 5-325 MG tablet Take 1 tablet by mouth every 6 (six) hours as needed for moderate pain or severe pain. Patient taking differently: Take 0.5-1 tablets by mouth every 6 (six) hours as needed for moderate pain or severe pain.  02/21/15  Yes Modena Jansky, MD  predniSONE (DELTASONE) 5 MG tablet Take 5 mg by mouth daily with breakfast.  04/24/12  Yes Historical Provider, MD  RANEXA 500 MG 12 hr tablet TAKE 1 TABLET BY MOUTH TWICE DAILY 01/11/15  Yes Sueanne Margarita, MD  ranitidine (ZANTAC) 150 MG tablet Take 150 mg by mouth daily.    Yes Historical Provider, MD  sodium bicarbonate 650 MG tablet Take 1,300 mg by mouth 2 (two) times daily.  07/13/11  Yes Historical Provider, MD  tacrolimus (PROGRAF) 1 MG capsule Take 2 mg by mouth 2 (two) times daily.    Yes Historical Provider, MD  warfarin (COUMADIN) 2.5 MG tablet Take 0.5-1 tablets (1.25-2.5 mg total) by mouth daily at 6 PM. Monday and Friday 2.5 mg and 1.25 mg on all other days 02/21/15  Yes Modena Jansky, MD  labetalol (NORMODYNE) 100 MG tablet Take 100 mg by mouth 2 (two) times daily. Reported on 03/13/2015 12/15/14   Historical Provider, MD  methocarbamol (ROBAXIN) 500 MG tablet Take 1 tablet (500 mg total) by mouth 2 (two) times daily. Patient not taking:  Reported on 03/03/2015 01/23/15   Marella Chimes, PA-C  timolol (TIMOPTIC) 0.5 % ophthalmic solution Place 1 drop into both eyes 2 (two) times daily. Reported on 03/13/2015 05/21/13   Historical Provider, MD   BP 135/52 mmHg  Pulse 69  Temp(Src) 99.3 F (37.4 C) (Oral)  Resp 21  SpO2 96% Physical Exam  Constitutional: She is oriented to person, place, and time. She appears well-developed and well-nourished. No distress.  HENT:  Head:  Normocephalic and atraumatic.  Eyes: EOM are normal.  Neck: Normal range of motion.  Cardiovascular: Normal rate, regular rhythm and normal heart sounds.   Pulmonary/Chest: Effort normal and breath sounds normal.  Abdominal: Soft. She exhibits no distension. There is no tenderness.  Musculoskeletal: Normal range of motion.  Neurological: She is alert and oriented to person, place, and time.  Skin: Skin is warm and dry.  Psychiatric: She has a normal mood and affect. Judgment normal.  Nursing note and vitals reviewed.   ED Course  Procedures (including critical care time) Labs Review Labs Reviewed  CBC - Abnormal; Notable for the following:    RBC 3.18 (*)    Hemoglobin 8.2 (*)    HCT 25.9 (*)    MCH 25.8 (*)    RDW 16.1 (*)    All other components within normal limits  BASIC METABOLIC PANEL - Abnormal; Notable for the following:    Sodium 123 (*)    Chloride 90 (*)    CO2 18 (*)    Glucose, Bld 115 (*)    BUN 73 (*)    Creatinine, Ser 4.31 (*)    Calcium 8.8 (*)    GFR calc non Af Amer 10 (*)    GFR calc Af Amer 11 (*)    All other components within normal limits  PROTIME-INR - Abnormal; Notable for the following:    Prothrombin Time 64.8 (*)    INR 8.14 (*)    All other components within normal limits    Imaging Review Ct Head Wo Contrast  03/14/2015  CLINICAL DATA:  Acute onset of dizziness.  Initial encounter. EXAM: CT HEAD WITHOUT CONTRAST TECHNIQUE: Contiguous axial images were obtained from the base of the skull through the  vertex without intravenous contrast. COMPARISON:  CT of the head performed 02/14/2015 FINDINGS: There is no evidence of acute infarction, mass lesion, or intra- or extra-axial hemorrhage on CT. Prominence of the ventricles sulci reflects mild cortical volume loss. The brainstem and fourth ventricle are within normal limits. The basal ganglia are unremarkable in appearance. The cerebral hemispheres demonstrate grossly normal gray-white differentiation. No mass effect or midline shift is seen. There is no evidence of fracture; visualized osseous structures are unremarkable in appearance. The visualized portions of the orbits are within normal limits. The paranasal sinuses and mastoid air cells are well-aerated. No significant soft tissue abnormalities are seen. IMPRESSION: 1. No acute intracranial pathology seen on CT. 2. Mild cortical volume loss noted. Electronically Signed   By: Garald Balding M.D.   On: 03/14/2015 00:35   I have personally reviewed and evaluated these images and lab results as part of my medical decision-making.   EKG Interpretation None      MDM   Final diagnoses:  Hyponatremia  AKI (acute kidney injury) [facility])  Renal transplant recipient    Admit to hospital for AKI with likely dehydration. Will add on additional workup including urine, cxr, blood cultures.      Jola Schmidt, MD 03/14/15 [phone]

## 2015-03-14 NOTE — Progress Notes (Signed)
CRITICAL VALUE ALERT  Critical value received:  PT 66.5 INR 8.43  Date of notification:  03/14/2015  Time of notification:  [id number]  Critical value read back:Yes.    Nurse who received alert:  Earleen Reaper RN  MD notified (1st page):  [facility]   Time of first page:  [phone]  MD notified (2nd page):   Time of second page:  Responding MD:  Dr. Hal Hope  Time MD responded:  [phone]  Orders received and will be implemented.  Continue to monitor patient.  Earleen Reaper RN

## 2015-03-14 NOTE — Progress Notes (Signed)
Amion page sent to [facility] regarding +BC.  Nilza Eaker S. Alford Highland, PharmD, [place] Clinical Staff Pharmacist Pager [phone]

## 2015-03-14 NOTE — Progress Notes (Signed)
Called by RN to notify of episodes of CP - pain relieved with SL NTG - Dr. Julianne Handler and Dr. Carles Collet seeing patient - alert, warm and dry - pain free with NSR with LBBB on my arrival.  NAD.  RN Ulice Dash getting ready to transfuse blood - assisted with initiation of PRBC - patient resting and comfortable.  RN Ulice Dash to clarify re: NTG - will be available to start drip and transfer patient as needed.  Handoff to [employer] - to call as needed.  Will follow.

## 2015-03-14 NOTE — Progress Notes (Signed)
Rapid response nurse called and updated the situation with the patient.

## 2015-03-14 NOTE — Evaluation (Signed)
PT Cancellation Note  Patient Details Name: Debbie [name] MRN: [id number] DOB: Feb 17, 1950   Cancelled Treatment:    Reason Eval/Treat Not Completed: Medical issues which prohibited therapy INR 8.43 not within therapeutic range, will follow pt to complete eval when medically ready.    Ara Kussmaul 03/14/2015, 11:53 AM  Ara Kussmaul, Student Physical Therapist Acute Rehab [phone]

## 2015-03-15 ENCOUNTER — Inpatient Hospital Stay (HOSPITAL_COMMUNITY): Payer: Medicare Other

## 2015-03-15 DIAGNOSIS — R7881 Bacteremia: Secondary | ICD-10-CM | POA: Diagnosis present

## 2015-03-15 DIAGNOSIS — I214 Non-ST elevation (NSTEMI) myocardial infarction: Secondary | ICD-10-CM

## 2015-03-15 LAB — PROTIME-INR
INR: 3.69 — AB (ref 0.00–1.49)
Prothrombin Time: 35.7 seconds — ABNORMAL HIGH (ref 11.6–15.2)

## 2015-03-15 LAB — BASIC METABOLIC PANEL
Anion gap: 9 (ref 5–15)
BUN: 75 mg/dL — AB (ref 6–20)
CALCIUM: 9.4 mg/dL (ref 8.9–10.3)
CHLORIDE: 103 mmol/L (ref 101–111)
CO2: 19 mmol/L — AB (ref 22–32)
CREATININE: 4.23 mg/dL — AB (ref 0.44–1.00)
GFR calc Af Amer: 12 mL/min — ABNORMAL LOW (ref >60)
GFR calc non Af Amer: 10 mL/min — ABNORMAL LOW (ref >60)
GLUCOSE: 148 mg/dL — AB (ref 65–99)
Potassium: 4.9 mmol/L (ref 3.5–5.1)
SODIUM: 131 mmol/L — AB (ref 135–145)

## 2015-03-15 LAB — CBC
HCT: 28 % — ABNORMAL LOW (ref 36.0–46.0)
Hemoglobin: 9.2 g/dL — ABNORMAL LOW (ref 12.0–15.0)
MCH: 27.2 pg (ref 26.0–34.0)
MCHC: 32.9 g/dL (ref 30.0–36.0)
MCV: 82.8 fL (ref 78.0–100.0)
PLATELETS: 174 10*3/uL (ref 150–400)
RBC: 3.38 MIL/uL — ABNORMAL LOW (ref 3.87–5.11)
RDW: 16.1 % — AB (ref 11.5–15.5)
WBC: 7.9 10*3/uL (ref 4.0–10.5)

## 2015-03-15 LAB — CK: CK TOTAL: 29 U/L — AB (ref 38–234)

## 2015-03-15 MED ORDER — SODIUM CHLORIDE 0.9 % IV SOLN
INTRAVENOUS | Status: AC
Start: 2015-03-15 — End: 2015-03-16
  Administered 2015-03-15: 23:00:00 via INTRAVENOUS
  Filled 2015-03-15: qty 1000

## 2015-03-15 MED ORDER — SODIUM CHLORIDE 0.9 % IV SOLN
530.0000 mg | INTRAVENOUS | Status: DC
  Administered 2015-03-15: 530 mg via INTRAVENOUS
  Filled 2015-03-15: qty 10.6

## 2015-03-15 NOTE — Progress Notes (Signed)
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 Jacques Navy appointment 02/16/15) presented to [facility] with 1 day history of dizziness and generalized weakness. The patient was recently discharged from the hospital on 02/21/2015 after treatment for NSTEMI and acute on chronic renal failure. The patient states that she has had poor by mouth intake for the past 2-3 days with constipation. Upon presentation, she was noted to have a serum creatinine of 4.31. The patient received 2 L bolus of fluid in the emergency department. She was also found to have INR of 8.14. On the afternoon of 03/14/2015, the patient developed substernal chest pain for which she received nitroglycerin with relief. Cardiology was consulted. EKG showed a new LBBB.  Assessment/Plan: Acute on chronic renal failure (CKD 4) in pt with renal transplant -due to volume depletion and infection -restart judicious IVF -baseline creatinine 2.2-2.3 -Patient endorses compliance with all her medications including immunosuppressive therapy -renal ultrasound -nephrology consult if renal function does not improve with IVF  Bacteremia-GPC -source unclear -surveillance blood cultures in am -Echo -Daptomycin pending identification -pt has anaphylaxis to vancomycin  Chest pain and new LBBB on EKG -Concerning for angina with new  left bundle branch block -relieved after SL NTG x 1 -EKG--new LBBB -appreciate cardiology -pt had NSTEMI last admission in Jan 2017 -02/15/2015 echo EF 40-45%, PAP 60 -continue ASA -continue imdur/Ranexa -defer Ranexa pharmacy concern to cardiology -continue lipitor -elevated troponins likely due to renal failure  Hyponatremia -Secondary to volume depletion -Improving with fluids  Paroxysmal atrial fibrillation -Presently in sinus rhythm -Rate controlled -Continue warfarin  Warfarin-induced coagulopathy -Patient has supratherapeutic INR--8.43-->3.69 -As the patient is not actively bleeding and hemodynamically stable, allow INR to drift down -vitamin K if bleed or INR continues to rise  Hypertension -Continue carvedilol  SLE/positive anticardiolipin antibodies/history of DVT/PE:  -On chronic Coumadin at home. INR is almost therapeutic. Status post renal transplant with chronic allograft nephropathy: Continue immunosuppressants  Anemia of CKD -Baseline hemoglobin 9-10 -transfuse one unit PRBC in setting of drop in Hgb and chest pain  Right fibular and medial malleolus fracture -As per cardiology, patient would be high risk for surgery with a recent NSTEMI -continue CAM boot -outpt follow up with Dr. Ninfa Linden   Family Communication: daughter updated at beside 03/14/15 Disposition Plan: Home >3 days    Procedures/Studies: Dg Chest 1 View  02/15/2015  CLINICAL DATA:  65 year old personal history of kidney transplant, currently with chest pain and acute onset of severe shortness of breath earlier today. EXAM: Portable CHEST 1 VIEW COMPARISON:  04/26/2014 and earlier. FINDINGS: Cardiac silhouette mildly to moderately enlarged, unchanged. Moderate diffuse interstitial and airspace pulmonary edema. Small bilateral pleural effusions. IMPRESSION: CHF, with stable cardiomegaly and moderate diffuse interstitial and airspace pulmonary edema associated with small bilateral  effusions. Electronically Signed   By: Evangeline Dakin M.D.   On: 02/15/2015 12:45   Dg Ankle Complete Right  02/17/2015  CLINICAL DATA:  Right ankle pain and deformity, fall in  hallway January 22, 2015 EXAM: RIGHT ANKLE - COMPLETE 3+ VIEW COMPARISON:  01/23/2015 FINDINGS: There is an oblique fracture of the lateral malleolus. There is transverse fracture of the medial malleolus. There is dislocation of the tibiotalar joint with lateral rotation of the talus. Although no definite posterior malleolar fracture is identified, there is significant artifact along the posterior aspect of the ankle on the lateral view. There is dense atherosclerotic calcification of the small vessels. IMPRESSION: 1. Bimalleolar fracture of the ankle. 2. Tibiotalar dislocation, increased significantly since prior study. Electronically Signed   By: Nolon Nations M.D.   On: 02/17/2015 14:30   Ct Head Wo Contrast  03/14/2015  CLINICAL DATA:  Acute onset of dizziness.  Initial encounter. EXAM: CT HEAD WITHOUT CONTRAST TECHNIQUE: Contiguous axial images were obtained from the base of the skull through the vertex without intravenous contrast. COMPARISON:  CT of the head performed 02/14/2015 FINDINGS: There is no evidence of acute infarction, mass lesion, or intra- or extra-axial hemorrhage on CT. Prominence of the ventricles sulci reflects mild cortical volume loss. The brainstem and fourth ventricle are within normal limits. The basal ganglia are unremarkable in appearance. The cerebral hemispheres demonstrate grossly normal gray-white differentiation. No mass effect or midline shift is seen. There is no evidence of fracture; visualized osseous structures are unremarkable in appearance. The visualized portions of the orbits are within normal limits. The paranasal sinuses and mastoid air cells are well-aerated. No significant soft tissue abnormalities are seen. IMPRESSION: 1. No acute intracranial pathology seen on CT. 2. Mild cortical  volume loss noted. Electronically Signed   By: Garald Balding M.D.   On: 03/14/2015 00:35   Ct Head Wo Contrast  02/14/2015  CLINICAL DATA:  Dizziness and nausea this morning. Similar of stones previously. EXAM: CT HEAD WITHOUT CONTRAST TECHNIQUE: Contiguous axial images were obtained from the base of the skull through the vertex without intravenous contrast. COMPARISON:  There is no evidence of mass effect, midline shift, or extra-axial fluid collections. There is no evidence of a space-occupying lesion or intracranial hemorrhage. There is no evidence of a cortical-based area of acute infarction. There is generalized cerebral atrophy. There is periventricular white matter low attenuation likely secondary to microangiopathy. The ventricles and sulci are appropriate for the patient's age. The basal cisterns are patent. Visualized portions of the orbits are unremarkable. The visualized portions of the paranasal sinuses and mastoid air cells are unremarkable. Cerebrovascular atherosclerotic calcifications are noted. The osseous structures are unremarkable. FINDINGS: 1. No acute intracranial pathology. 2. Chronic microvascular disease and cerebral atrophy. Electronically Signed   By: Kathreen Devoid   On: 02/14/2015 18:19   Ct Chest Wo Contrast  02/16/2015  CLINICAL DATA:  CHF exacerbation.  Kidney transplant patient. EXAM: CT CHEST WITHOUT CONTRAST TECHNIQUE: Multidetector CT imaging of the chest was performed following the standard protocol without IV contrast. COMPARISON:  08/27/2013 FINDINGS: Cardiac enlargement. Normal caliber thoracic aorta. Prominent Coronary artery and aortic calcifications. Esophagus is decompressed. No significant lymphadenopathy in the chest. Small right pleural effusion. Mild atelectasis in the lung bases. Perihilar airspace changes bilaterally and nodular ground-glass opacities on the right. These changes likely are due to edema or may be infectious or inflammatory. Suggest follow-up in  3 months to confirm resolution.Focal area of atelectasis or scarring in the left mid lung, unchanged since previous study. No pneumothorax. Included portions of the upper abdominal organs demonstrate extensive vascular calcifications in the abdominal aorta and major branch vessels. Bilateral renal atrophy. Surgical absence of  the gallbladder. Degenerative changes in the spine. IMPRESSION: Small right pleural effusion. Patchy perihilar infiltrates and nodular infiltrates in the lungs. Changes likely represent infectious or inflammatory changes versus edema. Suggest follow-up in 3 months. Extensive vascular calcifications. Electronically Signed   By: Lucienne Capers M.D.   On: 02/16/2015 03:23   Dg Chest Portable 1 View  03/14/2015  CLINICAL DATA:  Fever and weakness tonight. EXAM: PORTABLE CHEST 1 VIEW COMPARISON:  Radiograph 02/18/2015.  Chest CT 02/16/2015 FINDINGS: Cardiomediastinal contours are unchanged allowing for patient rotation. Small left pleural effusion, diminished from prior. No evidence of right pleural effusion. Left basilar scarring. No pulmonary edema. No confluent airspace disease. No pneumothorax. IMPRESSION: Persistent but decreased left pleural effusion. Exam is otherwise unchanged. Electronically Signed   By: Jeb Levering M.D.   On: 03/14/2015 02:09   Dg Chest Port 1 View  02/18/2015  CLINICAL DATA:  Central line placement EXAM: PORTABLE CHEST 1 VIEW COMPARISON:  02/16/2015 FINDINGS: Cardiomediastinal silhouette is stable. No pulmonary edema. Tiny right pleural effusion. There is left IJ central line with tip in SVC. No pneumothorax. No segmental infiltrate. IMPRESSION: Left IJ central line in place.  No pneumothorax. Electronically Signed   By: Lahoma Crocker M.D.   On: 02/18/2015 14:44         Subjective: Patient denies fevers, chills, headache, chest pain, dyspnea, nausea, vomiting, diarrhea, abdominal pain, dysuria, hematuria   Objective: Filed Vitals:   03/14/15 1730  03/14/15 2047 03/15/15 1300 03/15/15 1700  BP: 118/48 160/71 159/57 139/52  Pulse: 74 72 75 75  Temp: 98 F (36.7 C) 98.4 F (36.9 C) 98.2 F (36.8 C) 98 F (36.7 C)  TempSrc: Oral Oral Oral Oral  Resp: 18 18 18 18   Height:      Weight:  66.2 kg (145 lb 15.1 oz)    SpO2: 96% 98% 98% 95%    Intake/Output Summary (Last 24 hours) at 03/15/15 1927 Last data filed at 03/15/15 1800  Gross per 24 hour  Intake 1225.6 ml  Output    150 ml  Net 1075.6 ml   Weight change: 0.8 kg (1 lb 12.2 oz) Exam:   General:  Pt is alert, follows commands appropriately, not in acute distress  HEENT: No icterus, No thrush, No neck mass, [place]/AT  Cardiovascular: RRR, S1/S2, no rubs, no gallops  Respiratory: CTA bilaterally, no wheezing, no crackles, no rhonchi  Abdomen: Soft/+BS, non tender, non distended, no guarding  Extremities: 1+RLE edema, No lymphangitis, No petechiae, No rashes, no synovitis  Data Reviewed: Basic Metabolic Panel:  Recent Labs Lab 03/14/15 0426 03/14/15 0730 03/14/15 1139 03/14/15 1503 03/15/15 0651  NA 130* 132* 130* 131* 131*  K 5.1 5.0 4.9 5.1 4.9  CL 101 102 101 101 103  CO2 21* 19* 19* 19* 19*  GLUCOSE 124* 108* 104* 132* 148*  BUN 73* 71* 69* 69* 75*  CREATININE 4.32* 4.06* 4.10* 4.03* 4.23*  CALCIUM 9.0 8.8* 9.1 9.2 9.4  MG 2.2  --   --   --   --    Liver Function Tests: No results for input(s): AST, ALT, ALKPHOS, BILITOT, PROT, ALBUMIN in the last 168 hours. No results for input(s): LIPASE, AMYLASE in the last 168 hours. No results for input(s): AMMONIA in the last 168 hours. CBC:  Recent Labs Lab 03/13/15 2039 03/14/15 0426 03/15/15 0651  WBC 10.1 8.0 7.9  NEUTROABS  --  6.0  --   HGB 8.2* 7.4* 9.2*  HCT 25.9* 22.7* 28.0*  MCV 81.4 81.1 82.8  PLT 199 174 174   Cardiac Enzymes:  Recent Labs Lab 03/14/15 0426 03/14/15 0730 03/14/15 1139 03/14/15 2139 03/15/15 0906  CKTOTAL  --   --   --   --  29*  TROPONINI 0.05* 0.04* 0.05* 0.05*   --    BNP: Invalid input(s): POCBNP CBG: No results for input(s): GLUCAP in the last 168 hours.  Recent Results (from the past 240 hour(s))  Blood culture (routine x 2)     Status: None (Preliminary result)   Collection Time: 03/14/15  1:15 AM  Result Value Ref Range Status   Specimen Description BLOOD BLOOD LEFT FOREARM  Final   Special Requests BOTTLES DRAWN AEROBIC AND ANAEROBIC 5CC  Final   Culture  Setup Time   Final    GRAM POSITIVE COCCI IN CLUSTERS IN BOTH AEROBIC AND ANAEROBIC BOTTLES CRITICAL RESULT CALLED TO, READ BACK BY AND VERIFIED WITH: Vivianne Spence RN 2017 03/14/15 A BROWNING    Culture   Final    GRAM POSITIVE COCCI CULTURE REINCUBATED FOR BETTER GROWTH    Report Status PENDING  Incomplete  Blood culture (routine x 2)     Status: None (Preliminary result)   Collection Time: 03/14/15  1:33 AM  Result Value Ref Range Status   Specimen Description BLOOD RIGHT WRIST  Final   Special Requests BOTTLES DRAWN AEROBIC AND ANAEROBIC 5CC   Final   Culture  Setup Time   Final    GRAM POSITIVE COCCI IN CHAINS IN BOTH AEROBIC AND ANAEROBIC BOTTLES CRITICAL RESULT CALLED TO, READ BACK BY AND VERIFIED WITH: K MOORE,RN @0144  03/15/15 MKELLY    Culture   Final    GRAM POSITIVE COCCI CULTURE REINCUBATED FOR BETTER GROWTH    Report Status PENDING  Incomplete     Scheduled Meds: . allopurinol  200 mg Oral Daily  . atorvastatin  40 mg Oral q1800  . calcitRIOL  0.25 mcg Oral QODAY  . calcitRIOL  0.5 mcg Oral QODAY  . carvedilol  6.25 mg Oral BID WC  . DAPTOmycin (CUBICIN)  IV  530 mg Intravenous Q48H  . famotidine  20 mg Oral Daily  . feeding supplement (ENSURE ENLIVE)  237 mL Oral BID BM  . isosorbide mononitrate  60 mg Oral Daily  . latanoprost  1 drop Both Eyes QHS  . magnesium oxide  400 mg Oral BID  . mycophenolate  180 mg Oral BID  . predniSONE  5 mg Oral Q breakfast  . ranolazine  500 mg Oral BID  . sodium bicarbonate  1,300 mg Oral BID  . tacrolimus  2 mg Oral BID    . Warfarin - Pharmacist Dosing Inpatient   Does not apply q1800   Continuous Infusions:    Alyssamae Klinck, DO  [facility] Pager [phone]  If 7PM-7AM, please contact night-coverage [email or web address] Password [facility] 03/15/2015, 7:27 PM   LOS: 1 day

## 2015-03-15 NOTE — Progress Notes (Signed)
PT Cancellation Note  Patient Details Name: Debbie [name] MRN: [id number] DOB: 11-Jan-1951   Cancelled Treatment:    Reason Eval/Treat Not Completed: Medical issues which prohibited therapy. Pts INR at 3.69 today. Suspect will be in therapeutic range of 2-3 tomorrow in which PT can then perform eval as able.   Kingsley Callander 03/15/2015, 11:07 AM   Kittie Plater, PT, DPT Pager #: [phone] Office #: [phone]

## 2015-03-15 NOTE — Progress Notes (Addendum)
Pharmacy Antibiotic Note  Debbie [name] is a 65 y.o. female admitted on 03/13/2015 with bacteremia.  Pharmacy has been consulted for daptomycin dosing.  Patient has anaphylaxis to vancomycin. Linezolid was started last evening which is not an appropriate agent.  Plan: -After discussion with Dr. Carles Collet, antibiotics will be changed to daptomycin 8mg /kg q48h (also spoke with ID pharmacist who recommends this dose as more trial evidence supports higher mg/kg dosing strategy) -stat CK now, then qTuesday -will follow c/s, ID consult, renal function/need for HD  Height: 5\' 1"  (154.9 cm) Weight: 145 lb 15.1 oz (66.2 kg) IBW/kg (Calculated) : 47.8  Temp (24hrs), Avg:98.3 F (36.8 C), Min:98 F (36.7 C), Max:98.5 F (36.9 C)   Recent Labs Lab 03/13/15 2039 03/14/15 0426 03/14/15 0730 03/14/15 1139 03/14/15 1503 03/15/15 0651  WBC 10.1 8.0  --   --   --  7.9  CREATININE 4.31* 4.32* 4.06* 4.10* 4.03* 4.23*    Estimated Creatinine Clearance: 11.6 mL/min (by C-G formula based on Cr of 4.23).    Allergies  Allergen Reactions  . Feraheme [Ferumoxytol] Other (See Comments)    Chest pain, pulsating 02/17/15: tolerated Nulecit  . Vancomycin Anaphylaxis, Itching, Swelling and Other (See Comments)    Tongue swell  . Dorzolamide Hcl-Timolol Mal Rash and Other (See Comments)    Eye pain  . Gentamycin [Gentamicin Sulfate] Itching and Swelling  . Latanoprost Rash and Other (See Comments)    Eye pain  . Cefazolin Itching  . Codeine Itching  . Hydrocodone-Acetaminophen Itching  . Penicillins Itching    Has patient had a PCN reaction causing immediate rash, facial/tongue/throat swelling, SOB or lightheadedness with hypotension:No Has patient had a PCN reaction causing severe rash involving mucus membranes or skin necrosis:No Has patient had a PCN reaction that required hospitalization:No Has patient had a PCN reaction occurring within the last 10 years:No If all of the above answers are "NO",  then may proceed with Cephalosporin use.     Antimicrobials this admission: Zyvox 2/20 >> 2/21 (received 1 dose) Dapto 2/21 >>   Dose adjustments this admission: N/A  Microbiology results: 2/20 BCx: 2/2 GPC  Thank you for allowing pharmacy to be a part of this patient's care.  [place] Frieden D. Ferrell Flam, PharmD, BCPS Clinical Pharmacist Pager: [phone] 03/15/2015 8:38 AM

## 2015-03-15 NOTE — Progress Notes (Signed)
CRITICAL VALUE ALERT  Critical value received:  GRAM POSITIVE COCCI IN CHAINS  IN BOTH AEROBIC AND ANAEROBIC BOTTLES  Date of notification:  03/15/2015  Time of notification:  0144  Critical value read back:Yes.    Nurse who received alert:  Mady Gemma, RN  MD notified (1st page):  Lamar Blinks, pt previously initiated on Zyvoxx at 2221 on 03/14/15

## 2015-03-15 NOTE — Progress Notes (Signed)
Subjective:  No chest pain or jaw pain this am. She had chest pain yesterday PM- relieved with NTG.   Objective:  Vital Signs in the last 24 hours: Temp:  [98 F (36.7 C)-98.5 F (36.9 C)] 98.4 F (36.9 C) (02/20 2047) Pulse Rate:  [65-76] 72 (02/20 2047) Resp:  [18] 18 (02/20 2047) BP: (118-163)/(48-71) 160/71 mmHg (02/20 2047) SpO2:  [96 %-98 %] 98 % (02/20 2047) Weight:  [145 lb 15.1 oz (66.2 kg)] 145 lb 15.1 oz (66.2 kg) (02/20 2047)  Intake/Output from previous day:  Intake/Output Summary (Last 24 hours) at 03/15/15 0729 Last data filed at 03/14/15 2315  Gross per 24 hour  Intake    995 ml  Output    375 ml  Net    620 ml    Physical Exam: General appearance: alert, cooperative and no distress Lungs: clear to auscultation bilaterally Heart: regular rate and rhythm   Rate: 72  Rhythm: normal sinus rhythm  Lab Results:  Recent Labs  03/13/15 2039 03/14/15 0426  WBC 10.1 8.0  HGB 8.2* 7.4*  PLT 199 174    Recent Labs  03/14/15 1139 03/14/15 1503  NA 130* 131*  K 4.9 5.1  CL 101 101  CO2 19* 19*  GLUCOSE 104* 132*  BUN 69* 69*  CREATININE 4.10* 4.03*    Recent Labs  03/14/15 1139 03/14/15 2139  TROPONINI 0.05* 0.05*    Recent Labs  03/14/15 0426  INR 8.43*    Scheduled Meds: . allopurinol  200 mg Oral Daily  . atorvastatin  40 mg Oral q1800  . calcitRIOL  0.25 mcg Oral QODAY  . calcitRIOL  0.5 mcg Oral QODAY  . carvedilol  6.25 mg Oral BID WC  . famotidine  20 mg Oral Daily  . feeding supplement (ENSURE ENLIVE)  237 mL Oral BID BM  . isosorbide mononitrate  60 mg Oral Daily  . latanoprost  1 drop Both Eyes QHS  . linezolid (ZYVOX) IV  600 mg Intravenous Q12H  . magnesium oxide  400 mg Oral BID  . mycophenolate  180 mg Oral BID  . predniSONE  5 mg Oral Q breakfast  . ranolazine  500 mg Oral BID  . sodium bicarbonate  1,300 mg Oral BID  . tacrolimus  2 mg Oral BID  . Warfarin - Pharmacist Dosing Inpatient   Does not apply  q1800   Continuous Infusions:  PRN Meds:.acetaminophen **OR** acetaminophen, fentaNYL (SUBLIMAZE) injection, nitroGLYCERIN, ondansetron **OR** ondansetron (ZOFRAN) IV, oxyCODONE-acetaminophen   Imaging: Imaging results have been reviewed  Cardiac Studies: Echo 02/15/15 Impressions:  - Normal LV size with mild LV hypertrophy. EF 40-45% with diffuse hypokinesis, septal-lateral dyssynchrony. Normal RV size and systolic function. Calcified mitral valve and annulus, mild to moderate MR. Elevated mean gradient across the mitral valve probably due to high flow (does not appear to have significant stenosis). Aortic sclerosis without significant stenosis. Mild aortic insufficiency. Moderate pulmonary hypertension.  Assessment/Plan:  65 y/o AA female with stage 4 CRI s/p renal transplant, and hypercoagualble state on Coumadin, who has had prior admissions for ACS/NSTEMI but no cath due to her CKD. She was just admitted in Jan 2017 with hypotension and had NSTEMI. She is now admitted with weakness and found to have anemic with a  supra-therapeutic INR. No bleeding source. She has had jaw pain, her anginal equivalent and last pm she had chest pain relieved with NTG. She is not a good candidate for invasive evaluation given the INR  of 8.4, creatinine over 4.0 and anemia. Her EKG shows LBBB which has been noted on past EKG's over the last year.  Troponin only slightly elevated- 0.05. She was transfused last night- (after she had chest pain)   Principal Problem:   Acute renal failure superimposed on stage 4 chronic kidney disease ([place]) Active Problems:   Supratherapeutic INR   CKD (chronic kidney disease), stage IV (HCC)   Chronic anticoagulation - with Coumadin   Pulmonary hypertension ([place])   NSTEMI- Jan 2017- medical Rx   Renal transplant recipient   Coagulopathy ([place])   Cardiomyopathy, ischemic-40-45%   PAF (paroxysmal atrial fibrillation) (HCC)   HTN (hypertension)    Dyslipidemia   LBBB (left bundle branch block)   PLAN:  Transfused last pm. Suspect anemia contributing to her angina. On Coreg, Lipitor, Imdur, and Ranexa for medical Rx of CAD.    Kerin Ransom PA-C 03/15/2015, 7:29 AM [phone]  Patient seen and examined. I agree with the assessment and plan as detailed above. See also my additional thoughts below.   The patient was transfused yesterday. We will continue to follow her clinically to see how she does posttransfusion. No further cardiac recommendations today.  Dola Argyle, MD, [facility] 03/15/2015 8:16 AM

## 2015-03-15 NOTE — Progress Notes (Signed)
ANTICOAGULATION CONSULT NOTE  Pharmacy Consult for Warfarin  Indication: atrial fibrillation  Patient Measurements: Height: 5\' 1"  (154.9 cm) Weight: 145 lb 15.1 oz (66.2 kg) IBW/kg (Calculated) : 47.8  Vital Signs:    Labs:  Recent Labs  03/13/15 2039  03/14/15 0426 03/14/15 0730 03/14/15 1139 03/14/15 1503 03/14/15 2139 03/15/15 0651  HGB 8.2*  --  7.4*  --   --   --   --  9.2*  HCT 25.9*  --  22.7*  --   --   --   --  28.0*  PLT 199  --  174  --   --   --   --  174  LABPROT 64.8*  --  66.5*  --   --   --   --  35.7*  INR 8.14*  --  8.43*  --   --   --   --  3.69*  CREATININE 4.31*  --  4.32* 4.06* 4.10* 4.03*  --  4.23*  TROPONINI  --   < > 0.05* 0.04* 0.05*  --  0.05*  --   < > = values in this interval not displayed.  Estimated Creatinine Clearance: 11.6 mL/min (by C-G formula based on Cr of 4.23).   Assessment: 77 YOF s/p kidney transplant here with dizziness. She was recenetly discharged after episode of AKI and NSTEMI which was treated conservatively. She is on warfarin PTA for hypercoagulable state and hx DVT. INR was ~8 on 2/17 in the anti-coag clinic and instructions were to hold doses, admit INR was also high at 8.43.  Vitamin K 2.5mg  po x1 given yesterday, she was also transfused. INR down to 3.69 this morning.  Hgb 9.2, Plts 174- no bleeding noted.   Goal of Therapy:  INR 2-3 Monitor platelets by anticoagulation protocol: Yes   Plan:  -Hold warfarin again tonight, anticipate being able to start again tomorrow -Daily PT/INR -Monitor for bleeding  Rosalyn Archambault D. Giara Mcgaughey, PharmD, BCPS Clinical Pharmacist Pager: [phone] 03/15/2015 8:59 AM

## 2015-03-16 ENCOUNTER — Inpatient Hospital Stay (HOSPITAL_COMMUNITY): Payer: Medicare Other

## 2015-03-16 DIAGNOSIS — R509 Fever, unspecified: Secondary | ICD-10-CM

## 2015-03-16 DIAGNOSIS — R7881 Bacteremia: Secondary | ICD-10-CM | POA: Insufficient documentation

## 2015-03-16 DIAGNOSIS — I255 Ischemic cardiomyopathy: Secondary | ICD-10-CM

## 2015-03-16 DIAGNOSIS — R531 Weakness: Secondary | ICD-10-CM

## 2015-03-16 DIAGNOSIS — R42 Dizziness and giddiness: Secondary | ICD-10-CM

## 2015-03-16 DIAGNOSIS — B952 Enterococcus as the cause of diseases classified elsewhere: Secondary | ICD-10-CM | POA: Diagnosis present

## 2015-03-16 LAB — BASIC METABOLIC PANEL
Anion gap: 9 (ref 5–15)
BUN: 76 mg/dL — AB (ref 6–20)
CALCIUM: 9.4 mg/dL (ref 8.9–10.3)
CHLORIDE: 103 mmol/L (ref 101–111)
CO2: 20 mmol/L — AB (ref 22–32)
CREATININE: 3.96 mg/dL — AB (ref 0.44–1.00)
GFR calc non Af Amer: 11 mL/min — ABNORMAL LOW (ref >60)
GFR, EST AFRICAN AMERICAN: 13 mL/min — AB (ref >60)
GLUCOSE: 130 mg/dL — AB (ref 65–99)
Potassium: 5.2 mmol/L — ABNORMAL HIGH (ref 3.5–5.1)
Sodium: 132 mmol/L — ABNORMAL LOW (ref 135–145)

## 2015-03-16 LAB — CBC
HCT: 27.6 % — ABNORMAL LOW (ref 36.0–46.0)
Hemoglobin: 8.9 g/dL — ABNORMAL LOW (ref 12.0–15.0)
MCH: 26.9 pg (ref 26.0–34.0)
MCHC: 32.2 g/dL (ref 30.0–36.0)
MCV: 83.4 fL (ref 78.0–100.0)
PLATELETS: 173 10*3/uL (ref 150–400)
RBC: 3.31 MIL/uL — AB (ref 3.87–5.11)
RDW: 16.6 % — AB (ref 11.5–15.5)
WBC: 9.4 10*3/uL (ref 4.0–10.5)

## 2015-03-16 LAB — PROTIME-INR
INR: 3.53 — ABNORMAL HIGH (ref 0.00–1.49)
PROTHROMBIN TIME: 34.6 s — AB (ref 11.6–15.2)

## 2015-03-16 MED ORDER — SODIUM CHLORIDE 0.9 % IV SOLN
INTRAVENOUS | Status: AC
Start: 2015-03-16 — End: 2015-03-17
  Administered 2015-03-16 – 2015-03-17 (×2): via INTRAVENOUS
  Filled 2015-03-16: qty 1000

## 2015-03-16 MED ORDER — SODIUM CHLORIDE 0.9 % IV SOLN
575.0000 mg | INTRAVENOUS | Status: DC
  Administered 2015-03-17: 575 mg via INTRAVENOUS
  Filled 2015-03-16 (×2): qty 11.5

## 2015-03-16 MED ORDER — SODIUM POLYSTYRENE SULFONATE 15 GM/60ML PO SUSP
30.0000 g | Freq: Once | ORAL | Status: AC
  Administered 2015-03-16: 30 g via ORAL
  Filled 2015-03-16: qty 120

## 2015-03-16 NOTE — Evaluation (Signed)
Physical Therapy Evaluation Patient Details Name: Debbie [name] MRN: [id number] DOB: [birth year] Today's Date: 03/16/2015   History of Present Illness  65 yo female with history of renal transplant 2010 on chronic immunosuppression with tacrolimus and mycophenolate, HTN, antiphospholipid antibody syndrome with hypercoagulable state causing venous thrombosis of upper extremities, PAF, dyslipidemia, CAD, SLE, CKD  with recent fall and right fibular and medial malleolus fracture (NWBing) presented to [facility] with 1 day history of dizziness and generalized weakness.   Clinical Impression  Patient demonstrates deficits in functional mobility as indicated below. Will need continued skilled PT to address deficits and maximize function. Will see as indicated and progress as tolerated.  OF NOTE: given history of recent admissions, history of falls, and difficulty maintaining NWBing restrictions, feel that ideally patient would benefit from [facility] SNF care, however, patient does not appear open to this recommendation and states that she is to return home with family.    Follow Up Recommendations SNF;Supervision/Assistance - 24 hour (Pts adamant to return patient home.  )    Equipment Recommendations  None recommended by PT (reports they have all equipment needed.  )    Recommendations for Other Services       Precautions / Restrictions Precautions Precautions: Fall Restrictions Weight Bearing Restrictions: Yes (Fracture of ankle-Pt has brace) RLE Weight Bearing: Non weight bearing      Mobility  Bed Mobility Overal bed mobility: Needs Assistance Bed Mobility: Supine to Sit;Sit to Supine     Supine to sit: Min assist Sit to supine: Mod assist   General bed mobility comments: Patient required increased time and physical assist to bring LEs to eob and elevate trunk to upright. Cues for positioning and UE support to pull to upright provided. Upon return to bed, patient required increased assist for  bilateral LE elevation back to bed and rotation of trunk to reposition in bed. Patient could note adjust herself in the bed and required assist to position comfortable.  Transfers Overall transfer level: Needs assistance Equipment used: Rolling walker (2 wheeled);None Transfers: Sit to/from Stand Sit to Stand: Mod assist         General transfer comment: Utilized RW with cues for R NWB, difficulty maintaining NWBing in RLE. Required manual assist for RUE positioning on RW and increased physical assist to elevate to standing position at EOB. Perfored x3, increased effort and fatigue noted with basic functional task. Did not attempt transfer to chair as patient very fatigued and declined at this time.   Ambulation/Gait                Stairs            Wheelchair Mobility    Modified Rankin (Stroke Patients Only)       Balance Overall balance assessment: History of Falls Sitting-balance support: No upper extremity supported Sitting balance-Leahy Scale: Fair Sitting balance - Comments: supervision for sitting EOB ~8 minutes (MD present at time of EOB)                                     Pertinent Vitals/Pain Pain Assessment: 0-10 Pain Score: 4  Pain Location: ankle and right arm Pain Descriptors / Indicators: Sore Pain Intervention(s): Monitored during session;Limited activity within patient's tolerance;Repositioned;Relaxation    Home Living Family/patient expects to be discharged to:: Unsure Living Arrangements: Children Available Help at Discharge: Family;Available 24 hours/day Type of Home: House Home Access: Stairs  to enter   Entrance Stairs-Number of Steps: 1 Home Layout: One level Home Equipment: [place] - 2 wheels;Cane - single point;Bedside commode;Wheelchair - manual      Prior Function Level of Independence: Needs assistance   Gait / Transfers Assistance Needed: pt. reports moving from wheelchair to lift chair and commode PTA since  ankle fracture.  Pt. endorses that she has been unable to maintain NWB R LE at home           Hand Dominance   Dominant Hand: Right    Extremity/Trunk Assessment   Upper Extremity Assessment: Generalized weakness (pain limiting RUE)           Lower Extremity Assessment: Generalized weakness;RLE deficits/detail RLE Deficits / Details: RLE ankle fracture    Cervical / Trunk Assessment: Normal  Communication   Communication: No difficulties  Cognition Arousal/Alertness: Awake/alert Behavior During Therapy: Anxious;Flat affect Overall Cognitive Status: No family/caregiver present to determine baseline cognitive functioning                      General Comments      Exercises        Assessment/Plan    PT Assessment Patient needs continued PT services  PT Diagnosis Difficulty walking;Generalized weakness   PT Problem List Decreased strength;Decreased activity tolerance;Decreased balance;Decreased mobility;Decreased knowledge of use of DME  PT Treatment Interventions DME instruction;Gait training;Functional mobility training;Therapeutic exercise;Therapeutic activities;Balance training;Patient/family education   PT Goals (Current goals can be found in the Care Plan section) Acute Rehab PT Goals Patient Stated Goal: none stated PT Goal Formulation: With patient Time For Goal Achievement: 03/30/15 Potential to Achieve Goals: Fair    Frequency Min 2X/week   Barriers to discharge Decreased caregiver support      Co-evaluation               End of Session Equipment Utilized During Treatment: Gait belt Activity Tolerance: Patient tolerated treatment well Patient left: in bed;with call bell/phone within reach;with bed alarm set Nurse Communication: Mobility status         Time: [id number] PT Time Calculation (min) (ACUTE ONLY): 23 min   Charges:   PT Evaluation $PT Eval Moderate Complexity: 1 Procedure   (MD present during portion of  session)   PT G CodesDuncan Dull 04/04/15, 1:08 PM Alben Deeds, [place] DPT  [phone]

## 2015-03-16 NOTE — Consult Note (Signed)
Date of Admission:  03/13/2015  Date of Consult:  03/16/2015  Reason for Consult: Enterococcal bacteremia in renal transplant patient  Referring Physician: "auto-consult" and Dr. Carles Collet   HPI: Debbie [name] is an 65 y.o. female.  with history of renal transplant 2010 on chronic immunosuppression with tacrolimus and mycophenolate, HTN, antiphospholipid antibody syndrome with hypercoagulable state causing venous thrombosis of upper extremities for which she is on chronic Coumadin, PAF, dyslipidemia, CAD (positive Myoview June 2015-conservatively treated due to chronic kidney disease), SLE, CKD 4 with baseline creatinine 2.2-2.3, recent fall and right fibular and medial malleolus fracture-managed by cast and nonweightbearing (seen by Dr. Jacques Navy appointment 02/16/15)   The patient was recently discharged from the hospital on 02/21/2015 after treatment for NSTEMI and acute on chronic renal failure.    She then presented to [facility] with 1 day history of dizziness and generalized weakness.   She also claims to been having high fevers. She denies frank syncopal episodes. Upon arrival she was found to be in acute renal failure with a creatinine of 4.3 with supratherapeutic INR and EKG with a new left bundle branch block  Her admission blood cultures which were taken have subsequently grown Enterococcus in 1/2 and Coag negative staph in the o2nd culture site.  She had been on linezolid and is now on daptomycin.  The echocardiogram was done and did not show any evidence of vegetation.   Past Medical History  Diagnosis Date  . Kidney transplant as cause of abnormal reaction or later complication   . Hypertension   . Colon polyps   . Gall stones 1980  . Anti-phospholipid antibody syndrome [facility])     (Based on hospital discharge summary march 2013)  . Paroxysmal atrial fibrillation (HCC)   . Carpal tunnel syndrome 2003    lt  . Esophageal stricture   . Hemorrhoids   .  Renal disorder   . ESRD (end stage renal disease) on dialysis [facility])     "til I got my transplant in 2010"  . DVT (deep venous thrombosis) (HCC) 1990    LLE  . History of blood transfusion     "related to the lupus"  . Dyslipidemia 07/22/2013  . Coronary artery disease   . Heart murmur     mild MR on echo  . Pneumonia     "3 times now" (08/25/2013)  . GERD (gastroesophageal reflux disease)   . Arthritis     "fingers" (08/25/2013)  . Glaucoma, left eye   . Bilateral carotid artery stenosis     1-39%  . Pulmonary HTN (HCC)     PASP 87mHg    Past Surgical History  Procedure Laterality Date  . Nephrectomy transplanted organ  05/2008  . Cholecystectomy  1980  . Hematoma evacuation  1998    "after they took peritoneal catheter out"  . Peritoneal catheter insertion    . Peritoneal catheter removal    . Carpal tunnel release Right 07/2001  . Arteriovenous graft placement    . Thrombectomy and revision of arterioventous (av) goretex  graft Right 07/1999; 04/2002; 09/2002; 12/2005; 09/2007;     upper arm/notes 07/31/1999; 05/18/2002; 10/07/2002; 01/14/2006; 09/14/2007;   . Right heart catheterization N/A 08/28/2013    Procedure: RIGHT HEART CATH;  Surgeon: HSinclair Grooms MD;  [facility];  Service: Cardiovascular;  Laterality: N/A;    Social History:  reports that she quit smoking about 37 years ago. Her smoking use  included Cigarettes. She smoked 0.00 packs per day for 30 years. She has never used smokeless tobacco. She reports that she drinks alcohol. She reports that she does not use illicit drugs.   Family History  Problem Relation Age of Onset  . Hypertension Mother   . Heart disease Mother   . Colon cancer Neg Hx   . Rectal cancer Neg Hx   . Stomach cancer Neg Hx     Allergies  Allergen Reactions  . Feraheme [Ferumoxytol] Other (See Comments)    Chest pain, pulsating 02/17/15: tolerated Nulecit  . Vancomycin Anaphylaxis, Itching, Swelling and Other (See Comments)     Tongue swell  . Dorzolamide Hcl-Timolol Mal Rash and Other (See Comments)    Eye pain  . Gentamycin [Gentamicin Sulfate] Itching and Swelling  . Latanoprost Rash and Other (See Comments)    Eye pain  . Cefazolin Itching  . Codeine Itching  . Hydrocodone-Acetaminophen Itching  . Penicillins Itching    Has patient had a PCN reaction causing immediate rash, facial/tongue/throat swelling, SOB or lightheadedness with hypotension:No Has patient had a PCN reaction causing severe rash involving mucus membranes or skin necrosis:No Has patient had a PCN reaction that required hospitalization:No Has patient had a PCN reaction occurring within the last 10 years:No If all of the above answers are "NO", then may proceed with Cephalosporin use.      Medications: I have reviewed patients current medications as documented in Epic Anti-infectives    Start     Dose/Rate Route Frequency Ordered Stop   03/17/15 1100  DAPTOmycin (CUBICIN) 575 mg in sodium chloride 0.9 % IVPB     575 mg 223 mL/hr over 30 Minutes Intravenous Every 48 hours 03/16/15 1122     03/15/15 1100  DAPTOmycin (CUBICIN) 530 mg in sodium chloride 0.9 % IVPB  Status:  Discontinued     530 mg 221.2 mL/hr over 30 Minutes Intravenous Every 48 hours 03/15/15 0847 03/16/15 1122   03/14/15 2230  linezolid (ZYVOX) IVPB 600 mg  Status:  Discontinued     600 mg 300 mL/hr over 60 Minutes Intravenous Every 12 hours 03/14/15 2220 03/15/15 0830         ROS:  as in HPI otherwise remainder of 12 point Review of Systems is negative    Blood pressure 150/52, pulse 75, temperature 98.5 F (36.9 C), temperature source Oral, resp. rate 19, height 5' 1"  (1.549 m), weight 155 lb (70.308 kg), SpO2 96 %.   General: Alert and awake, oriented x3, not in any acute distress. HEENT: anicteric sclera,  EOMI, oropharynx clear and without exudate Cardiovascular: irr, irr rate, normal r,  no murmur rubs or gallops Pulmonary: clear to auscultation  bilaterally, no wheezing, rales or rhonchi Gastrointestinal: soft nontender, nondistended, normal bowel sounds, Musculoskeletal:right ankle in cast Skin, soft tissue: no rashes Neuro: nonfocal, strength and sensation intact   Results for orders placed or performed during the hospital encounter of 03/13/15 (from the past 48 hour(s))  Prepare RBC     Status: None   Collection Time: 03/14/15  4:29 PM  Result Value Ref Range   Order Confirmation ORDER PROCESSED BY BLOOD BANK   Sodium, urine, random     Status: None   Collection Time: 03/14/15  8:58 PM  Result Value Ref Range   Sodium, Ur 47 mmol/L  Osmolality, urine     Status: None   Collection Time: 03/14/15  8:59 PM  Result Value Ref Range   Osmolality, Ur 376 300 -  900 mOsm/kg  Troponin I (q 6hr x 3)     Status: Abnormal   Collection Time: 03/14/15  9:39 PM  Result Value Ref Range   Troponin I 0.05 (H) <0.031 ng/mL    Comment:        PERSISTENTLY INCREASED TROPONIN VALUES IN THE RANGE OF 0.04-0.49 ng/mL CAN BE SEEN IN:       -UNSTABLE ANGINA       -CONGESTIVE HEART FAILURE       -MYOCARDITIS       -CHEST TRAUMA       -ARRYHTHMIAS       -LATE PRESENTING MYOCARDIAL INFARCTION       -COPD   CLINICAL FOLLOW-UP RECOMMENDED.   Protime-INR     Status: Abnormal   Collection Time: 03/15/15  6:51 AM  Result Value Ref Range   Prothrombin Time 35.7 (H) 11.6 - 15.2 seconds   INR 3.69 (H) 0.00 - 8.24  Basic metabolic panel     Status: Abnormal   Collection Time: 03/15/15  6:51 AM  Result Value Ref Range   Sodium 131 (L) 135 - 145 mmol/L   Potassium 4.9 3.5 - 5.1 mmol/L   Chloride 103 101 - 111 mmol/L   CO2 19 (L) 22 - 32 mmol/L   Glucose, Bld 148 (H) 65 - 99 mg/dL   BUN 75 (H) 6 - 20 mg/dL   Creatinine, Ser 4.23 (H) 0.44 - 1.00 mg/dL   Calcium 9.4 8.9 - 10.3 mg/dL   GFR calc non Af Amer 10 (L) >60 mL/min   GFR calc Af Amer 12 (L) >60 mL/min    Comment: (NOTE) The eGFR has been calculated using the CKD EPI equation. This  calculation has not been validated in all clinical situations. eGFR's persistently <60 mL/min signify possible Chronic Kidney Disease.    Anion gap 9 5 - 15  CBC     Status: Abnormal   Collection Time: 03/15/15  6:51 AM  Result Value Ref Range   WBC 7.9 4.0 - 10.5 K/uL   RBC 3.38 (L) 3.87 - 5.11 MIL/uL   Hemoglobin 9.2 (L) 12.0 - 15.0 g/dL   HCT 28.0 (L) 36.0 - 46.0 %   MCV 82.8 78.0 - 100.0 fL   MCH 27.2 26.0 - 34.0 pg   MCHC 32.9 30.0 - 36.0 g/dL   RDW 16.1 (H) 11.5 - 15.5 %   Platelets 174 150 - 400 K/uL  CK     Status: Abnormal   Collection Time: 03/15/15  9:06 AM  Result Value Ref Range   Total CK 29 (L) 38 - 234 U/L  Protime-INR     Status: Abnormal   Collection Time: 03/16/15  5:09 AM  Result Value Ref Range   Prothrombin Time 34.6 (H) 11.6 - 15.2 seconds   INR 3.53 (H) 0.00 - 2.35  Basic metabolic panel     Status: Abnormal   Collection Time: 03/16/15  5:09 AM  Result Value Ref Range   Sodium 132 (L) 135 - 145 mmol/L   Potassium 5.2 (H) 3.5 - 5.1 mmol/L   Chloride 103 101 - 111 mmol/L   CO2 20 (L) 22 - 32 mmol/L   Glucose, Bld 130 (H) 65 - 99 mg/dL   BUN 76 (H) 6 - 20 mg/dL   Creatinine, Ser 3.96 (H) 0.44 - 1.00 mg/dL   Calcium 9.4 8.9 - 10.3 mg/dL   GFR calc non Af Amer 11 (L) >60 mL/min   GFR calc Af Wyvonnia Lora  13 (L) >60 mL/min    Comment: (NOTE) The eGFR has been calculated using the CKD EPI equation. This calculation has not been validated in all clinical situations. eGFR's persistently <60 mL/min signify possible Chronic Kidney Disease.    Anion gap 9 5 - 15  CBC     Status: Abnormal   Collection Time: 03/16/15  5:09 AM  Result Value Ref Range   WBC 9.4 4.0 - 10.5 K/uL   RBC 3.31 (L) 3.87 - 5.11 MIL/uL   Hemoglobin 8.9 (L) 12.0 - 15.0 g/dL   HCT 27.6 (L) 36.0 - 46.0 %   MCV 83.4 78.0 - 100.0 fL   MCH 26.9 26.0 - 34.0 pg   MCHC 32.2 30.0 - 36.0 g/dL   RDW 16.6 (H) 11.5 - 15.5 %   Platelets 173 150 - 400 K/uL    @BRIEFLABTABLE (sdes,specrequest,cult,reptstatus)   ) Recent Results (from the past 720 hour(s))  MRSA PCR Screening     Status: None   Collection Time: 02/15/15 10:46 AM  Result Value Ref Range Status   MRSA by PCR NEGATIVE NEGATIVE Final    Comment:        The GeneXpert MRSA Assay (FDA approved for NASAL specimens only), is one component of a comprehensive MRSA colonization surveillance program. It is not intended to diagnose MRSA infection nor to guide or monitor treatment for MRSA infections.   Culture, Urine     Status: None   Collection Time: 02/15/15  1:52 PM  Result Value Ref Range Status   Specimen Description URINE, CATHETERIZED  Final   Special Requests Normal  Final   Culture   Final    >=100,000 COLONIES/mL DIPHTHEROIDS(CORYNEBACTERIUM SPECIES) Standardized susceptibility testing for this organism is not available.    Report Status 02/16/2015 FINAL  Final  Blood culture (routine x 2)     Status: None (Preliminary result)   Collection Time: 03/14/15  1:15 AM  Result Value Ref Range Status   Specimen Description BLOOD BLOOD LEFT FOREARM  Final   Special Requests BOTTLES DRAWN AEROBIC AND ANAEROBIC 5CC  Final   Culture  Setup Time   Final    GRAM POSITIVE COCCI IN CLUSTERS IN BOTH AEROBIC AND ANAEROBIC BOTTLES CRITICAL RESULT CALLED TO, READ BACK BY AND VERIFIED WITH: Vivianne Spence RN 2017 03/14/15 A BROWNING    Culture   Final    STAPHYLOCOCCUS SPECIES (COAGULASE NEGATIVE) CULTURE REINCUBATED FOR BETTER GROWTH    Report Status PENDING  Incomplete  Blood culture (routine x 2)     Status: None (Preliminary result)   Collection Time: 03/14/15  1:33 AM  Result Value Ref Range Status   Specimen Description BLOOD RIGHT WRIST  Final   Special Requests BOTTLES DRAWN AEROBIC AND ANAEROBIC 5CC   Final   Culture  Setup Time   Final    GRAM POSITIVE COCCI IN CHAINS IN BOTH AEROBIC AND ANAEROBIC BOTTLES CRITICAL RESULT CALLED TO, READ BACK BY AND VERIFIED WITH: K  MOORE,RN @0144  03/15/15 MKELLY    Culture ENTEROCOCCUS SPECIES SUSCEPTIBILITIES TO FOLLOW   Final   Report Status PENDING  Incomplete     Impression/Recommendation  Principal Problem:   Acute renal failure superimposed on stage 4 chronic kidney disease (HCC) Active Problems:   CKD (chronic kidney disease), stage IV (HCC)   PAF (paroxysmal atrial fibrillation) (HCC)   HTN (hypertension)   Chronic anticoagulation - with Coumadin   Dyslipidemia   Pulmonary hypertension ([place])   NSTEMI- Jan 2017- medical Rx   Renal  transplant recipient   Coagulopathy ([place])   Cardiomyopathy, ischemic-40-45%   Supratherapeutic INR   LBBB (left bundle branch block)   Gram-positive bacteremia   Debbie [name] is a 65 y.o. female with renal transplant on immunosuppressive drugs with recent admission for NSTEMI now admitted with dizziness, weakness and fevers and found to have Enterococcus in 1/2 blood cultures  #1 Enterococcal bacteremia:  Agree with continue IV cubicin for now but may want to push the dose  Repeat blood cultures have been ordered  Would strongly consider TEE to ensure no endocarditis  Ensure there is no clear cut source, she spoke about placement of a line during an admission in January but it is not present now  Her fracture site should be carefully examined   03/16/2015, 4:00 PM   Thank you so much for this interesting consult  [place] [facility] [place] [phone] (pager) [phone] (office) 03/16/2015, 4:00 PM  [place] 03/16/2015, 4:00 PM

## 2015-03-16 NOTE — Progress Notes (Signed)
[place] for Warfarin  Indication: atrial fibrillation  Patient Measurements: Height: 5\' 1"  (154.9 cm) Weight: 155 lb (70.308 kg) IBW/kg (Calculated) : 47.8  Vital Signs: Temp: 98.5 F (36.9 C) (02/22 0830) Temp Source: Oral (02/22 0830) BP: 150/52 mmHg (02/22 0830) Pulse Rate: 75 (02/22 0830)  Labs:  Recent Labs  03/14/15 0426 03/14/15 0730 03/14/15 1139 03/14/15 1503 03/14/15 2139 03/15/15 0651 03/15/15 0906 03/16/15 0509  HGB 7.4*  --   --   --   --  9.2*  --  8.9*  HCT 22.7*  --   --   --   --  28.0*  --  27.6*  PLT 174  --   --   --   --  174  --  173  LABPROT 66.5*  --   --   --   --  35.7*  --  34.6*  INR 8.43*  --   --   --   --  3.69*  --  3.53*  CREATININE 4.32* 4.06* 4.10* 4.03*  --  4.23*  --  3.96*  CKTOTAL  --   --   --   --   --   --  29*  --   TROPONINI 0.05* 0.04* 0.05*  --  0.05*  --   --   --     Estimated Creatinine Clearance: 12.7 mL/min (by C-G formula based on Cr of 3.96).   Assessment: 4 YOF s/p kidney transplant here with dizziness. She was recenetly discharged after episode of AKI and NSTEMI which was treated conservatively. She is on warfarin PTA for hypercoagulable state and hx DVT. INR was ~8 on 2/17 in the anti-coag clinic and instructions were to hold doses, admit INR was also high at 8.43.  Vitamin K 2.5mg  po x1 given2/20, she was also transfused on 2/21. INR down to 3.53 this morning- not dropping rapidly.   Hgb 8.9, Plts 173- stable, no bleeding noted.   Goal of Therapy:  INR 2-3 Monitor platelets by anticoagulation protocol: Yes   Plan:  -Hold warfarin again tonight as INR is not dropping rapidly. Not concerned it will drop to subtherapeutic with tomorrow's draw -Daily PT/INR -Monitor for bleeding  Carden Teel D. Bunnie Rehberg, PharmD, BCPS Clinical Pharmacist Pager: [phone] 03/16/2015 10:40 AM

## 2015-03-16 NOTE — Progress Notes (Signed)
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 Jacques Navy appointment 02/16/15) presented to [facility] with 1 day history of dizziness and generalized weakness. The patient was recently discharged from the hospital on 02/21/2015 after treatment for NSTEMI and acute on chronic renal failure. The patient states that she has had poor by mouth intake for the past 2-3 days with constipation. Upon presentation, she was noted to have a serum creatinine of 4.31. The patient received 2 L bolus of fluid in the emergency department. She was also found to have INR of 8.14. On the afternoon of 03/14/2015, the patient developed substernal chest pain for which she received nitroglycerin with relief. Cardiology was consulted. EKG showed a new LBBB.  Assessment/Plan: Acute on chronic renal failure (CKD 4) in pt with renal transplant -due to volume depletion and infection -restarted judicious IVF -baseline creatinine 2.2-2.3 -Patient endorses compliance with all her medications including immunosuppressive therapy -renal ultrasound: Native kidneys not identified possibly due to atrophy. Transplanted kidney without hydronephrosis. - Creatinine was 2.2 on 1/30. Admitted with creatinine of 4.31 and has decreased slightly to 3.96. If no significant improvement by tomorrow, consult nephrology  Mild  hyperkalemia - We'll treat with a dose of Kayexalate and follow BMP  Enterococcus bacteremia -source unclear. One of 2 blood cultures positive. 2nd blood culture Coag neg staphylococcus. -pt has anaphylaxis to vancomycin - Infectious disease follow-up appreciated. Continue IV Cubicin for now. Surveillance blood cultures ordered. ID recommends TEE - she wants [place] to discuss with her nephrologist. Will pursue TEE discussion in a.m.  Chest pain and new LBBB on EKG -Concerning for angina with new left bundle branch block -relieved after SL NTG x 1 -EKG--new LBBB -pt had NSTEMI last admission in Jan 2017 -02/15/2015 echo EF 40-45%, PAP 60 -continue ASA -continue imdur/Ranexa -defer Ranexa pharmacy concern to cardiology -elevated troponins likely due to renal failure - Cardiology follow-up appreciated: Suspect anemia contribute it to her angina. Continue carvedilol, Lipitor, Imdur and Ranexa. Cardiology does not recommend any further workup and have signed off/22.  Hyponatremia -Secondary to volume depletion -Improving/stable with fluids  Paroxysmal atrial fibrillation -Presently in sinus rhythm -Rate controlled -Continue warfarin. INR 3.53.  Warfarin-induced coagulopathy -Patient has supratherapeutic INR--8.43 on admission. Decreased. -As the patient is not actively bleeding and hemodynamically stable, allow INR to drift down -vitamin K if bleed or INR continues to rise  Hypertension -Continue carvedilol  SLE/positive anticardiolipin antibodies/history of DVT/PE:  -On chronic Coumadin at home. INR as above.. Status post renal transplant with chronic allograft nephropathy: Continue immunosuppressants  Anemia of CKD -Baseline hemoglobin 9-10 -transfuse one unit PRBC in setting of drop in Hgb and chest pain. Hemoglobin 8.9.  Right fibular and medial malleolus fracture -As per cardiology, patient would be high risk for surgery with a recent NSTEMI -continue CAM boot -outpt  follow up with Dr. Ninfa Linden   DVT prophylaxis: Start subcutaneous heparin. CODE STATUS: Full Family Communication: None at bedside Disposition Plan: DC home  when medically stable.    Procedures/Studies: Dg Chest 1 View  02/15/2015  CLINICAL DATA:  65 year old personal history of kidney transplant, currently with chest pain and acute onset of severe shortness of breath earlier today. EXAM: Portable CHEST 1 VIEW COMPARISON:  04/26/2014 and earlier. FINDINGS: Cardiac silhouette mildly to moderately enlarged, unchanged. Moderate diffuse interstitial and airspace pulmonary edema. Small bilateral pleural effusions. IMPRESSION: CHF, with stable cardiomegaly and moderate diffuse interstitial and airspace pulmonary edema associated with small bilateral effusions. Electronically Signed   By: Evangeline Dakin M.D.   On: 02/15/2015 12:45   Dg Ankle Complete Right  02/17/2015  CLINICAL DATA:  Right ankle pain and deformity, fall in hallway January 22, 2015 EXAM: RIGHT ANKLE - COMPLETE 3+ VIEW COMPARISON:  01/23/2015 FINDINGS: There is an oblique fracture of the lateral malleolus. There is transverse fracture of the medial malleolus. There is dislocation of the tibiotalar joint with lateral rotation of the talus. Although no definite posterior malleolar fracture is identified, there is significant artifact along the posterior aspect of the ankle on the lateral view. There is dense atherosclerotic calcification of the small vessels. IMPRESSION: 1. Bimalleolar fracture of the ankle. 2. Tibiotalar dislocation, increased significantly since prior study. Electronically Signed   By: Nolon Nations M.D.   On: 02/17/2015 14:30   Ct Head Wo Contrast  03/14/2015  CLINICAL DATA:  Acute onset of dizziness.  Initial encounter. EXAM: CT HEAD WITHOUT CONTRAST TECHNIQUE: Contiguous axial images were obtained from the base of the skull through the vertex without intravenous contrast. COMPARISON:  CT of the head performed  02/14/2015 FINDINGS: There is no evidence of acute infarction, mass lesion, or intra- or extra-axial hemorrhage on CT. Prominence of the ventricles sulci reflects mild cortical volume loss. The brainstem and fourth ventricle are within normal limits. The basal ganglia are unremarkable in appearance. The cerebral hemispheres demonstrate grossly normal gray-white differentiation. No mass effect or midline shift is seen. There is no evidence of fracture; visualized osseous structures are unremarkable in appearance. The visualized portions of the orbits are within normal limits. The paranasal sinuses and mastoid air cells are well-aerated. No significant soft tissue abnormalities are seen. IMPRESSION: 1. No acute intracranial pathology seen on CT. 2. Mild cortical volume loss noted. Electronically Signed   By: Garald Balding M.D.   On: 03/14/2015 00:35   Ct Head Wo Contrast  02/14/2015  CLINICAL DATA:  Dizziness and nausea this morning. Similar of stones previously. EXAM: CT HEAD WITHOUT CONTRAST TECHNIQUE: Contiguous axial images were obtained from the base of the skull through the vertex without intravenous contrast. COMPARISON:  There is no evidence of mass effect, midline shift, or extra-axial fluid collections. There is no evidence of a space-occupying lesion or intracranial hemorrhage. There is no evidence of a cortical-based area of acute infarction. There is generalized cerebral atrophy. There is periventricular white matter low attenuation likely secondary to microangiopathy. The ventricles and sulci are appropriate for the patient's age. The basal cisterns are patent. Visualized portions of the orbits are unremarkable. The visualized portions of the paranasal sinuses and mastoid air cells are unremarkable. Cerebrovascular atherosclerotic calcifications are noted. The osseous structures are unremarkable. FINDINGS: 1. No acute intracranial pathology. 2. Chronic microvascular disease and cerebral atrophy.  Electronically Signed   By: Kathreen Devoid   On: 02/14/2015 18:19   Ct Chest Wo Contrast  02/16/2015  CLINICAL DATA:  CHF exacerbation.  Kidney transplant patient. EXAM: CT CHEST WITHOUT CONTRAST TECHNIQUE: Multidetector CT imaging of the chest  was performed following the standard protocol without IV contrast. COMPARISON:  08/27/2013 FINDINGS: Cardiac enlargement. Normal caliber thoracic aorta. Prominent Coronary artery and aortic calcifications. Esophagus is decompressed. No significant lymphadenopathy in the chest. Small right pleural effusion. Mild atelectasis in the lung bases. Perihilar airspace changes bilaterally and nodular ground-glass opacities on the right. These changes likely are due to edema or may be infectious or inflammatory. Suggest follow-up in 3 months to confirm resolution.Focal area of atelectasis or scarring in the left mid lung, unchanged since previous study. No pneumothorax. Included portions of the upper abdominal organs demonstrate extensive vascular calcifications in the abdominal aorta and major branch vessels. Bilateral renal atrophy. Surgical absence of the gallbladder. Degenerative changes in the spine. IMPRESSION: Small right pleural effusion. Patchy perihilar infiltrates and nodular infiltrates in the lungs. Changes likely represent infectious or inflammatory changes versus edema. Suggest follow-up in 3 months. Extensive vascular calcifications. Electronically Signed   By: Lucienne Capers M.D.   On: 02/16/2015 03:23   US Renal  03/15/2015  CLINICAL DATA:  Acute renal failure superimposed on stage 4 chronic kidney disease. EXAM: RENAL / URINARY TRACT ULTRASOUND COMPLETE COMPARISON:  Included portions from chest CT 02/18/2015 FINDINGS: Right Kidney: Not visualized. Left Kidney: Not visualized. Transplant kidney located in the pelvis, side not specified. There is no transplant hydronephrosis. Blood flow seen. Transplant kidney measures 10 cm. Transplant cortical thickness appears  maintained. No focal lesion. Detailed transplant Doppler evaluation not performed. Bladder: Appears normal for degree of bladder distention. IMPRESSION: 1. Native kidneys not identified, likely secondary renal atrophy. 2. Pelvic transplant kidney without hydronephrosis. Electronically Signed   By: Jeb Levering M.D.   On: 03/15/2015 23:08   Dg Chest Portable 1 View  03/14/2015  CLINICAL DATA:  Fever and weakness tonight. EXAM: PORTABLE CHEST 1 VIEW COMPARISON:  Radiograph 02/18/2015.  Chest CT 02/16/2015 FINDINGS: Cardiomediastinal contours are unchanged allowing for patient rotation. Small left pleural effusion, diminished from prior. No evidence of right pleural effusion. Left basilar scarring. No pulmonary edema. No confluent airspace disease. No pneumothorax. IMPRESSION: Persistent but decreased left pleural effusion. Exam is otherwise unchanged. Electronically Signed   By: Jeb Levering M.D.   On: 03/14/2015 02:09   Dg Chest Port 1 View  02/18/2015  CLINICAL DATA:  Central line placement EXAM: PORTABLE CHEST 1 VIEW COMPARISON:  02/16/2015 FINDINGS: Cardiomediastinal silhouette is stable. No pulmonary edema. Tiny right pleural effusion. There is left IJ central line with tip in SVC. No pneumothorax. No segmental infiltrate. IMPRESSION: Left IJ central line in place.  No pneumothorax. Electronically Signed   By: Lahoma Crocker M.D.   On: 02/18/2015 14:44        Subjective: Denies complaints. Denies dyspnea, cough or chest pain. States that she is making good amount of urine.  Objective: Filed Vitals:   03/15/15 2045 03/16/15 0546 03/16/15 0830 03/16/15 1708  BP: 146/66 135/73 150/52 147/60  Pulse: 81 75 75 78  Temp: 98.5 F (36.9 C) 98.4 F (36.9 C) 98.5 F (36.9 C) 99.1 F (37.3 C)  TempSrc: Oral Oral Oral Oral  Resp: 18 19 19 19   Height:      Weight: 70.308 kg (155 lb)     SpO2: 98% 96% 96% 97%    Intake/Output Summary (Last 24 hours) at 03/16/15 1717 Last data filed at  03/16/15 1352  Gross per 24 hour  Intake   1005 ml  Output    700 ml  Net    305 ml   Weight  change: 4.108 kg (9 lb 0.9 oz) Exam:   General:  Pt is alert, follows commands appropriately, not in acute distress. Sitting comfortably at edge of bed.  HEENT: No icterus, No thrush, No neck mass, /AT  Cardiovascular: RRR, S1/S2, no rubs, no gallops. Telemetry: Sinus rhythm.  Respiratory: CTA bilaterally, no wheezing, no crackles, no rhonchi  Abdomen: Soft/+BS, non tender, non distended, no guarding  Extremities: 1+RLE edema, No lymphangitis, No petechiae, No rashes, no synovitis. Right leg in cam boot.  Data Reviewed: Basic Metabolic Panel:  Recent Labs Lab 03/14/15 0426 03/14/15 0730 03/14/15 1139 03/14/15 1503 03/15/15 0651 03/16/15 0509  NA 130* 132* 130* 131* 131* 132*  K 5.1 5.0 4.9 5.1 4.9 5.2*  CL 101 102 101 101 103 103  CO2 21* 19* 19* 19* 19* 20*  GLUCOSE 124* 108* 104* 132* 148* 130*  BUN 73* 71* 69* 69* 75* 76*  CREATININE 4.32* 4.06* 4.10* 4.03* 4.23* 3.96*  CALCIUM 9.0 8.8* 9.1 9.2 9.4 9.4  MG 2.2  --   --   --   --   --    Liver Function Tests: No results for input(s): AST, ALT, ALKPHOS, BILITOT, PROT, ALBUMIN in the last 168 hours. No results for input(s): LIPASE, AMYLASE in the last 168 hours. No results for input(s): AMMONIA in the last 168 hours. CBC:  Recent Labs Lab 03/13/15 2039 03/14/15 0426 03/15/15 0651 03/16/15 0509  WBC 10.1 8.0 7.9 9.4  NEUTROABS  --  6.0  --   --   HGB 8.2* 7.4* 9.2* 8.9*  HCT 25.9* 22.7* 28.0* 27.6*  MCV 81.4 81.1 82.8 83.4  PLT 199 174 174 173   Cardiac Enzymes:  Recent Labs Lab 03/14/15 0426 03/14/15 0730 03/14/15 1139 03/14/15 2139 03/15/15 0906  CKTOTAL  --   --   --   --  29*  TROPONINI 0.05* 0.04* 0.05* 0.05*  --    BNP: Invalid input(s): POCBNP CBG: No results for input(s): GLUCAP in the last 168 hours.  Recent Results (from the past 240 hour(s))  Blood culture (routine x 2)     Status:  None (Preliminary result)   Collection Time: 03/14/15  1:15 AM  Result Value Ref Range Status   Specimen Description BLOOD BLOOD LEFT FOREARM  Final   Special Requests BOTTLES DRAWN AEROBIC AND ANAEROBIC 5CC  Final   Culture  Setup Time   Final    GRAM POSITIVE COCCI IN CLUSTERS IN BOTH AEROBIC AND ANAEROBIC BOTTLES CRITICAL RESULT CALLED TO, READ BACK BY AND VERIFIED WITH: Vivianne Spence RN 2017 03/14/15 A BROWNING    Culture   Final    STAPHYLOCOCCUS SPECIES (COAGULASE NEGATIVE) CULTURE REINCUBATED FOR BETTER GROWTH    Report Status PENDING  Incomplete  Blood culture (routine x 2)     Status: None (Preliminary result)   Collection Time: 03/14/15  1:33 AM  Result Value Ref Range Status   Specimen Description BLOOD RIGHT WRIST  Final   Special Requests BOTTLES DRAWN AEROBIC AND ANAEROBIC 5CC   Final   Culture  Setup Time   Final    GRAM POSITIVE COCCI IN CHAINS IN BOTH AEROBIC AND ANAEROBIC BOTTLES CRITICAL RESULT CALLED TO, READ BACK BY AND VERIFIED WITH: K MOORE,RN @0144  03/15/15 MKELLY    Culture ENTEROCOCCUS SPECIES SUSCEPTIBILITIES TO FOLLOW   Final   Report Status PENDING  Incomplete     Scheduled Meds: . allopurinol  200 mg Oral Daily  . atorvastatin  40 mg Oral q1800  .  calcitRIOL  0.25 mcg Oral QODAY  . calcitRIOL  0.5 mcg Oral QODAY  . carvedilol  6.25 mg Oral BID WC  . [START ON 03/17/2015] DAPTOmycin (CUBICIN)  IV  575 mg Intravenous Q48H  . famotidine  20 mg Oral Daily  . feeding supplement (ENSURE ENLIVE)  237 mL Oral BID BM  . isosorbide mononitrate  60 mg Oral Daily  . latanoprost  1 drop Both Eyes QHS  . magnesium oxide  400 mg Oral BID  . mycophenolate  180 mg Oral BID  . predniSONE  5 mg Oral Q breakfast  . ranolazine  500 mg Oral BID  . sodium bicarbonate  1,300 mg Oral BID  . tacrolimus  2 mg Oral BID  . Warfarin - Pharmacist Dosing Inpatient   Does not apply q1800   Continuous Infusions:   Jeanetta Alonzo, MD, FACP, FHM. [facility] Pager  [phone]  If 7PM-7AM, please contact night-coverage [email or web address] Password [email or web address] 03/16/2015, 5:29 PM   LOS: 2 days

## 2015-03-16 NOTE — Progress Notes (Signed)
Echocardiogram 2D Echocardiogram has been performed.  Debbie [name] 03/16/2015, 12:27 PM

## 2015-03-16 NOTE — Progress Notes (Addendum)
Subjective:  No chest pain or jaw pain this am. Up in bed, eating breakfast  Objective:  Vital Signs in the last 24 hours: Temp:  [98 F (36.7 C)-98.5 F (36.9 C)] 98.4 F (36.9 C) (02/22 0546) Pulse Rate:  [75-81] 75 (02/22 0546) Resp:  [18-19] 19 (02/22 0546) BP: (135-159)/(52-73) 135/73 mmHg (02/22 0546) SpO2:  [95 %-98 %] 96 % (02/22 0546) Weight:  [155 lb (70.308 kg)] 155 lb (70.308 kg) (02/21 2045)  Intake/Output from previous day:  Intake/Output Summary (Last 24 hours) at 03/16/15 0847 Last data filed at 03/16/15 0606  Gross per 24 hour  Intake 1235.6 ml  Output    500 ml  Net  735.6 ml    Physical Exam: General appearance: alert, cooperative and no distress Lungs: clear to auscultation bilaterally Heart: regular rate and rhythm   Rate: 72  Rhythm: normal sinus rhythm  Lab Results:  Recent Labs  03/15/15 0651 03/16/15 0509  WBC 7.9 9.4  HGB 9.2* 8.9*  PLT 174 173    Recent Labs  03/15/15 0651 03/16/15 0509  NA 131* 132*  K 4.9 5.2*  CL 103 103  CO2 19* 20*  GLUCOSE 148* 130*  BUN 75* 76*  CREATININE 4.23* 3.96*    Recent Labs  03/14/15 1139 03/14/15 2139  TROPONINI 0.05* 0.05*    Recent Labs  03/16/15 0509  INR 3.53*    Scheduled Meds: . allopurinol  200 mg Oral Daily  . atorvastatin  40 mg Oral q1800  . calcitRIOL  0.25 mcg Oral QODAY  . calcitRIOL  0.5 mcg Oral QODAY  . carvedilol  6.25 mg Oral BID WC  . DAPTOmycin (CUBICIN)  IV  530 mg Intravenous Q48H  . famotidine  20 mg Oral Daily  . feeding supplement (ENSURE ENLIVE)  237 mL Oral BID BM  . isosorbide mononitrate  60 mg Oral Daily  . latanoprost  1 drop Both Eyes QHS  . magnesium oxide  400 mg Oral BID  . mycophenolate  180 mg Oral BID  . predniSONE  5 mg Oral Q breakfast  . ranolazine  500 mg Oral BID  . sodium bicarbonate  1,300 mg Oral BID  . tacrolimus  2 mg Oral BID  . Warfarin - Pharmacist Dosing Inpatient   Does not apply q1800   Continuous  Infusions: . sodium chloride 0.9 % 1,000 mL infusion 75 mL/hr at 03/15/15 2300   PRN Meds:.acetaminophen **OR** acetaminophen, fentaNYL (SUBLIMAZE) injection, nitroGLYCERIN, ondansetron **OR** ondansetron (ZOFRAN) IV, oxyCODONE-acetaminophen   Imaging: Imaging results have been reviewed  Cardiac Studies: Echo 02/15/15 Impressions:  - Normal LV size with mild LV hypertrophy. EF 40-45% with diffuse hypokinesis, septal-lateral dyssynchrony. Normal RV size and systolic function. Calcified mitral valve and annulus, mild to moderate MR. Elevated mean gradient across the mitral valve probably due to high flow (does not appear to have significant stenosis). Aortic sclerosis without significant stenosis. Mild aortic insufficiency. Moderate pulmonary hypertension.  Assessment/Plan:  65 y/o AA female with stage 4 CRI s/p renal transplant, and hypercoagualble state on Coumadin, who has had prior admissions for ACS/NSTEMI but no cath due to her CKD. She was just admitted in Jan 2017 with hypotension and had NSTEMI. She is now admitted with weakness and found to have anemic with a  supra-therapeutic INR. No bleeding source. She has had jaw pain, her anginal equivalent and last pm she had chest pain relieved with NTG. She is not a good candidate for invasive evaluation given the  INR of 8.4, creatinine over 4.0 and anemia. Her EKG shows LBBB which has been noted on past EKG's over the last year, though it was noted on EKG in Jan.  Troponin only slightly elevated this admission- 0.05. She was noted to be anemic-Hgb 7.4- she was transfused and has not had recurrent chest pain.    Principal Problem:   Acute renal failure superimposed on stage 4 chronic kidney disease (HCC) Active Problems:   Supratherapeutic INR   CKD (chronic kidney disease), stage IV (HCC)   Chronic anticoagulation - with Coumadin   Pulmonary hypertension ([place])   NSTEMI- Jan 2017- medical Rx   Renal transplant recipient    Coagulopathy ([place])   Cardiomyopathy, ischemic-40-45%   PAF (paroxysmal atrial fibrillation) (HCC)   HTN (hypertension)   Dyslipidemia   LBBB (left bundle branch block)   Gram-positive bacteremia   PLAN:   Suspect anemia contributed to her angina. On Coreg, Lipitor, Imdur, and Ranexa for medical Rx of CAD.  SCr slightly improved.   Kerin Ransom PA-C 03/16/2015, 8:47 AM [phone]   At this point it appears that no further cardiac workup is indicated. Cardiology will sign off.

## 2015-03-17 ENCOUNTER — Inpatient Hospital Stay (HOSPITAL_COMMUNITY)
Admit: 2015-03-17 | Discharge: 2015-03-17 | Disposition: A | Discharge status: Home / Self Care | Payer: Medicare Other | Attending: Internal Medicine | Admitting: Internal Medicine

## 2015-03-17 ENCOUNTER — Other Ambulatory Visit: Payer: Self-pay

## 2015-03-17 ENCOUNTER — Inpatient Hospital Stay (HOSPITAL_COMMUNITY): Payer: Medicare Other

## 2015-03-17 DIAGNOSIS — R609 Edema, unspecified: Secondary | ICD-10-CM

## 2015-03-17 DIAGNOSIS — M79621 Pain in right upper arm: Secondary | ICD-10-CM

## 2015-03-17 DIAGNOSIS — M79601 Pain in right arm: Secondary | ICD-10-CM

## 2015-03-17 DIAGNOSIS — M79631 Pain in right forearm: Secondary | ICD-10-CM

## 2015-03-17 DIAGNOSIS — M25511 Pain in right shoulder: Secondary | ICD-10-CM

## 2015-03-17 LAB — BASIC METABOLIC PANEL
ANION GAP: 7 (ref 5–15)
BUN: 69 mg/dL — ABNORMAL HIGH (ref 6–20)
CO2: 22 mmol/L (ref 22–32)
Calcium: 9.1 mg/dL (ref 8.9–10.3)
Chloride: 107 mmol/L (ref 101–111)
Creatinine, Ser: 3.41 mg/dL — ABNORMAL HIGH (ref 0.44–1.00)
GFR, EST AFRICAN AMERICAN: 15 mL/min — AB (ref >60)
GFR, EST NON AFRICAN AMERICAN: 13 mL/min — AB (ref >60)
GLUCOSE: 124 mg/dL — AB (ref 65–99)
POTASSIUM: 4.3 mmol/L (ref 3.5–5.1)
Sodium: 136 mmol/L (ref 135–145)

## 2015-03-17 LAB — TYPE AND SCREEN
ABO/RH(D): B POS
ANTIBODY SCREEN: NEGATIVE
UNIT DIVISION: 0

## 2015-03-17 LAB — CULTURE, BLOOD (ROUTINE X 2)

## 2015-03-17 LAB — TROPONIN I
TROPONIN I: 0.04 ng/mL — AB (ref <0.031)
Troponin I: 0.04 ng/mL — ABNORMAL HIGH (ref <0.031)
Troponin I: 0.08 ng/mL — ABNORMAL HIGH (ref <0.031)

## 2015-03-17 LAB — PROTIME-INR
INR: 3.29 — ABNORMAL HIGH (ref 0.00–1.49)
Prothrombin Time: 32.8 seconds — ABNORMAL HIGH (ref 11.6–15.2)

## 2015-03-17 LAB — CBC
HEMATOCRIT: 27.3 % — AB (ref 36.0–46.0)
HEMOGLOBIN: 9.1 g/dL — AB (ref 12.0–15.0)
MCH: 28.6 pg (ref 26.0–34.0)
MCHC: 33.3 g/dL (ref 30.0–36.0)
MCV: 85.8 fL (ref 78.0–100.0)
Platelets: 155 10*3/uL (ref 150–400)
RBC: 3.18 MIL/uL — AB (ref 3.87–5.11)
RDW: 16.9 % — ABNORMAL HIGH (ref 11.5–15.5)
WBC: 6.4 10*3/uL (ref 4.0–10.5)

## 2015-03-17 MED ORDER — PREDNISONE 20 MG PO TABS
40.0000 mg | ORAL_TABLET | Freq: Once | ORAL | Status: AC
  Administered 2015-03-17: 40 mg via ORAL
  Filled 2015-03-17: qty 2

## 2015-03-17 MED ORDER — WARFARIN SODIUM 1 MG PO TABS
1.0000 mg | ORAL_TABLET | Freq: Once | ORAL | Status: AC
  Administered 2015-03-17: 1 mg via ORAL
  Filled 2015-03-17 (×2): qty 1

## 2015-03-17 MED ORDER — PRO-STAT SUGAR FREE PO LIQD
30.0000 mL | Freq: Every day | ORAL | Status: DC
  Administered 2015-03-18 – 2015-03-26 (×8): 30 mL via ORAL
  Filled 2015-03-17 (×11): qty 30

## 2015-03-17 MED ORDER — ISOSORBIDE MONONITRATE ER 60 MG PO TB24
90.0000 mg | ORAL_TABLET | Freq: Every day | ORAL | Status: DC
  Administered 2015-03-18 – 2015-03-28 (×11): 90 mg via ORAL
  Filled 2015-03-17 (×12): qty 1

## 2015-03-17 MED ORDER — NEPRO/CARBSTEADY PO LIQD
237.0000 mL | Freq: Two times a day (BID) | ORAL | Status: DC
  Administered 2015-03-18 – 2015-03-28 (×15): 237 mL via ORAL

## 2015-03-17 MED ORDER — CARVEDILOL 12.5 MG PO TABS
12.5000 mg | ORAL_TABLET | Freq: Two times a day (BID) | ORAL | Status: DC
  Administered 2015-03-17 – 2015-03-18 (×3): 12.5 mg via ORAL
  Filled 2015-03-17 (×3): qty 1

## 2015-03-17 MED ORDER — MORPHINE SULFATE (PF) 2 MG/ML IV SOLN
2.0000 mg | Freq: Once | INTRAVENOUS | Status: AC
  Administered 2015-03-17: 2 mg via INTRAVENOUS
  Filled 2015-03-17: qty 1

## 2015-03-17 MED ORDER — SODIUM CHLORIDE 0.9 % IV SOLN
INTRAVENOUS | Status: AC
  Filled 2015-03-17: qty 1000

## 2015-03-17 NOTE — Progress Notes (Signed)
Utilization review completed. Tudor Chandley, RN, BSN. 

## 2015-03-17 NOTE — Consult Note (Signed)
Reason for Consult: Acute renal failure on chronic kidney disease stage IVT Referring Physician: Vernell Leep MD [facility])  HPI:  65 year old African-American woman with past medical history significant for hypertension, chronic SLE, antiphospholipid antibody syndrome with hypercoagulable state and end-stage renal disease status post kidney transplant in May, 2010 on chronic immunosuppression with tacrolimus and mycophenolate. She has a baseline creatinine ranging 2.2-2.7 from chronic allograft nephropathy. She was recently hospitalized with right fibular fracture/medial malleolus fracture managed with a cast and nonweightbearing.  At that time, hospitalization complicated by acute on chronic renal failure (hemodynamically mediated) and with NSTEMI that was medically treated.  She presented to the emergency room 2 days ago with dizziness and weakness lasting about a day prior to presentation but denied any chest pain or shortness of breath. She is found to be marginally hypotensive in the emergency room and also found to be in acute on chronic renal failure with a creatinine of 4.3. Since admission, she has had some substernal chest pain with new LBBB responsive to nitroglycerin-medication management adjusted. She has also been found to have enterococcus bacteremia and coagulase-negative Staphylococcus bacteremia for which he is on daptomycin -TEE being considered given her immunosuppressed status and the need to ensure that she does not have endocarditis. She reports poor intake of food and fluids prior to her admission to the hospital but denies any nausea, vomiting or diarrhea. She denies any allograft site pain and does not have any dysuria, urgency, frequency or flank pain.    Past Medical History  Diagnosis Date  . Kidney transplant as cause of abnormal reaction or later complication   . Hypertension   . Colon polyps   . Gall stones 1980  . Anti-phospholipid antibody syndrome [facility])     (Based on  hospital discharge summary march 2013)  . Paroxysmal atrial fibrillation (HCC)   . Carpal tunnel syndrome 2003    lt  . Esophageal stricture   . Hemorrhoids   . Renal disorder   . ESRD (end stage renal disease) on dialysis [facility])     "til I got my transplant in 2010"  . DVT (deep venous thrombosis) (HCC) 1990    LLE  . History of blood transfusion     "related to the lupus"  . Dyslipidemia 07/22/2013  . Coronary artery disease   . Heart murmur     mild MR on echo  . Pneumonia     "3 times now" (08/25/2013)  . GERD (gastroesophageal reflux disease)   . Arthritis     "fingers" (08/25/2013)  . Glaucoma, left eye   . Bilateral carotid artery stenosis     1-39%  . Pulmonary HTN (HCC)     PASP 36mmHg    Past Surgical History  Procedure Laterality Date  . Nephrectomy transplanted organ  05/2008  . Cholecystectomy  1980  . Hematoma evacuation  1998    "after they took peritoneal catheter out"  . Peritoneal catheter insertion    . Peritoneal catheter removal    . Carpal tunnel release Right 07/2001  . Arteriovenous graft placement    . Thrombectomy and revision of arterioventous (av) goretex  graft Right 07/1999; 04/2002; 09/2002; 12/2005; 09/2007;     upper arm/notes 07/31/1999; 05/18/2002; 10/07/2002; 01/14/2006; 09/14/2007;   . Right heart catheterization N/A 08/28/2013    Procedure: RIGHT HEART CATH;  Surgeon: Sinclair Grooms, MD;  Location: [facility] CATH LAB;  Service: Cardiovascular;  Laterality: N/A;    Family History  Problem Relation Age of  Onset  . Hypertension Mother   . Heart disease Mother   . Colon cancer Neg Hx   . Rectal cancer Neg Hx   . Stomach cancer Neg Hx     Social History:  reports that she quit smoking about 37 years ago. Her smoking use included Cigarettes. She smoked 0.00 packs per day for 30 years. She has never used smokeless tobacco. She reports that she drinks alcohol. She reports that she does not use illicit drugs.  Allergies:  Allergies  Allergen Reactions   . Feraheme [Ferumoxytol] Other (See Comments)    Chest pain, pulsating 02/17/15: tolerated Nulecit  . Vancomycin Anaphylaxis, Itching, Swelling and Other (See Comments)    Tongue swell  . Dorzolamide Hcl-Timolol Mal Rash and Other (See Comments)    Eye pain  . Gentamycin [Gentamicin Sulfate] Itching and Swelling  . Latanoprost Rash and Other (See Comments)    Eye pain  . Cefazolin Itching  . Codeine Itching  . Hydrocodone-Acetaminophen Itching  . Penicillins Itching    Has patient had a PCN reaction causing immediate rash, facial/tongue/throat swelling, SOB or lightheadedness with hypotension:No Has patient had a PCN reaction causing severe rash involving mucus membranes or skin necrosis:No Has patient had a PCN reaction that required hospitalization:No Has patient had a PCN reaction occurring within the last 10 years:No If all of the above answers are "NO", then may proceed with Cephalosporin use.     Medications:  Scheduled: . allopurinol  200 mg Oral Daily  . atorvastatin  40 mg Oral q1800  . calcitRIOL  0.25 mcg Oral QODAY  . calcitRIOL  0.5 mcg Oral QODAY  . carvedilol  12.5 mg Oral BID WC  . DAPTOmycin (CUBICIN)  IV  575 mg Intravenous Q48H  . famotidine  20 mg Oral Daily  . feeding supplement (ENSURE ENLIVE)  237 mL Oral BID BM  . [START ON 03/18/2015] isosorbide mononitrate  90 mg Oral Daily  . latanoprost  1 drop Both Eyes QHS  . magnesium oxide  400 mg Oral BID  . mycophenolate  180 mg Oral BID  . predniSONE  40 mg Oral Once  . predniSONE  5 mg Oral Q breakfast  . sodium bicarbonate  1,300 mg Oral BID  . tacrolimus  2 mg Oral BID  . warfarin  1 mg Oral ONCE-1800  . Warfarin - Pharmacist Dosing Inpatient   Does not apply q1800    BMP Latest Ref Rng 03/17/2015 03/16/2015 03/15/2015  Glucose 65 - 99 mg/dL 124(H) 130(H) 148(H)  BUN 6 - 20 mg/dL 69(H) 76(H) 75(H)  Creatinine 0.44 - 1.00 mg/dL 3.41(H) 3.96(H) 4.23(H)  Sodium 135 - 145 mmol/L 136 132(L) 131(L)   Potassium 3.5 - 5.1 mmol/L 4.3 5.2(H) 4.9  Chloride 101 - 111 mmol/L 107 103 103  CO2 22 - 32 mmol/L 22 20(L) 19(L)  Calcium 8.9 - 10.3 mg/dL 9.1 9.4 9.4   CBC Latest Ref Rng 03/17/2015 03/16/2015 03/15/2015  WBC 4.0 - 10.5 K/uL 6.4 9.4 7.9  Hemoglobin 12.0 - 15.0 g/dL 9.1(L) 8.9(L) 9.2(L)  Hematocrit 36.0 - 46.0 % 27.3(L) 27.6(L) 28.0(L)  Platelets 150 - 400 K/uL 155 173 174     US Renal  03/15/2015  CLINICAL DATA:  Acute renal failure superimposed on stage 4 chronic kidney disease. EXAM: RENAL / URINARY TRACT ULTRASOUND COMPLETE COMPARISON:  Included portions from chest CT 02/18/2015 FINDINGS: Right Kidney: Not visualized. Left Kidney: Not visualized. Transplant kidney located in the pelvis, side not specified. There is no  transplant hydronephrosis. Blood flow seen. Transplant kidney measures 10 cm. Transplant cortical thickness appears maintained. No focal lesion. Detailed transplant Doppler evaluation not performed. Bladder: Appears normal for degree of bladder distention. IMPRESSION: 1. Native kidneys not identified, likely secondary renal atrophy. 2. Pelvic transplant kidney without hydronephrosis. Electronically Signed   By: Jeb Levering M.D.   On: 03/15/2015 23:08    Review of Systems  Constitutional: Positive for fever, chills and malaise/fatigue.  HENT: Negative.   Eyes: Negative.   Respiratory: Negative.   Cardiovascular: Positive for chest pain and palpitations. Negative for orthopnea, claudication and leg swelling.  Gastrointestinal: Negative.   Genitourinary: Negative.   Musculoskeletal: Positive for joint pain.       Right knee/ankle  Skin: Negative.    Blood pressure 163/80, pulse 103, temperature 98.8 F (37.1 C), temperature source Oral, resp. rate 18, height 5\' 1"  (1.549 m), weight 70.5 kg (155 lb 6.8 oz), SpO2 100 %. Physical Exam  Nursing note and vitals reviewed. Constitutional: She is oriented to person, place, and time. She appears well-developed. She  appears distressed.  Alopecic and appears uncomfortable from chest pain--rocking back and forth in bed  HENT:  Head: Atraumatic.  Mouth/Throat: Oropharynx is clear and moist. No oropharyngeal exudate.  Eyes: EOM are normal. Pupils are equal, round, and reactive to light. No scleral icterus.  Neck: Normal range of motion. Neck supple. JVD present. No thyromegaly present.  Right jugular vein prominent, left flat  Cardiovascular: Normal heart sounds.  Exam reveals no friction rub.   No murmur heard. Irregular tachycardia  Respiratory: Effort normal and breath sounds normal. She has no wheezes. She has no rales.  GI: Soft. Bowel sounds are normal. She exhibits no distension. There is no rebound.  Musculoskeletal: She exhibits no edema.  Right leg in supportive boot. Left ankle without edema  Lymphadenopathy:    She has no cervical adenopathy.  Neurological: She is alert and oriented to person, place, and time.  Skin: Skin is warm and dry. No rash noted. No erythema.    Assessment/Plan: 1. Acute renal failure on chronic kidney disease stage IV T: this appears to have been hemodynamically mediated with hypotension/poor intake, renal function appears to be improving with cautious intravenous fluid therapy/treatment of underlying infection. We'll continue immunosuppressive therapy at current doses and check tacrolimus level with labs tomorrow morning. No acute electrolyte abnormalities-hyponatremia and hyperkalemia improving with improving renal function. I will repeat a urinalysis today. 2. Enterococcus/coagulase-negative staph bacteremia: transthoracic echocardiogram did not show any valvular vegetations-awaiting TEE to further rule out the possibility of SBE. 3. Chest pain/new LBBB on EKG: complaining of chest pain when seen-she has just received sublingual nitroglycerin and a dose of morphine. 4. Anemia of chronic kidney disease: recent evaluation of iron stores last month showed significant  iron deficiency with an iron saturation of 7% but prohibitively high ferritin to allow for intravenous iron therapy. She denies any overt losses in spite of her supratherapeutic INR-will start ESA after controlling blood pressures. 5. Hypertension: on carvedilol 0.5 mg twice a day and isosorbide recently increased to 90 mg daily -currently off diuretics. 6. Right arm swelling: differential diagnosis include deep venous thrombosis and central vein stenosis-she has a ipsilateral right upper arm arteriovenous graft and appears to have prominent external jugular vein on the right side as well as some prominent veins on her right upper chest favoring the latter. She is already on anticoagulation with Coumadin so evaluation for DVT would just be an academic exercise to  explain swelling. At some point after she has sufficient renal recovery, she may need central venogram with angioplasty if she has stenosis to alleviate some of her swelling.   Cathy Crounse K. 03/17/2015, 1:26 PM

## 2015-03-17 NOTE — Progress Notes (Signed)
Patient had episode of chest pain left side non radiating, denies short of breath SBP was in the 170's HR in the 80's given 1 NTG SL and 12 lead EKG was done. After 5 mins patient chest pain gradually subside. SBP was in 140's. Pain free and more comfortable. Glynn Octave was notified and also cardiology on call Dr. Clayborne Artist. Will continue to monitor patient.

## 2015-03-17 NOTE — Progress Notes (Signed)
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 Jacques Navy appointment 02/16/15) presented to [facility] with 1 day history of dizziness and generalized weakness. The patient was recently discharged from the hospital on 02/21/2015 after treatment for NSTEMI and acute on chronic renal failure. The patient states that she has had poor by mouth intake for the past 2-3 days with constipation. Upon presentation, she was noted to have a serum creatinine of 4.31. The patient received 2 L bolus of fluid in the emergency department. She was also found to have INR of 8.14. On the afternoon of 03/14/2015, the patient developed substernal chest pain for which she received nitroglycerin with relief. Cardiology was consulted. EKG showed a new LBBB.  Assessment/Plan: Acute on chronic renal failure (CKD 4) in pt with renal transplant -due to volume depletion and infection -restarted judicious IVF -baseline creatinine 2.2-2.3 -Patient endorses compliance with all her medications including immunosuppressive therapy -renal ultrasound: Native kidneys not identified possibly due to atrophy. Transplanted kidney without hydronephrosis. - Creatinine was 2.2 on 1/30. Admitted with creatinine of 4.31 and is gradually improving (3.41 on 2/23). - Requested nephrology consultation given complex renal issues and  patient wishes to speak with them prior to deciding on TEE.  Mild hyperkalemia - Resolved after a dose of Kayexalate.  Enterococcus bacteremia -source unclear. One of 2 blood cultures positive for enterococcus. 2nd blood culture positive for enterococcus and Coag neg staphylococcus. -pt has anaphylaxis to vancomycin - Infectious disease follow-up appreciated. Continue IV Cubicin for now. Surveillance blood cultures ordered. ID recommends TEE - she wants [place] to discuss with her nephrologist -consulted. Patient is not keen on TEE but is contemplating same.  Chest pain and new LBBB on EKG -Concerning for angina with new left bundle branch block -relieved after SL NTG x 1 -EKG--new LBBB -pt had NSTEMI last admission in Jan 2017 -02/15/2015 echo EF 40-45%, PAP 60 -continue ASA -continue imdur/Ranexa -defer Ranexa pharmacy concern to cardiology -elevated troponins likely due to renal failure - Cardiology follow-up appreciated: Suspect anemia contribute it to her angina. Continue carvedilol, Lipitor, Imdur and Ranexa. Cardiology does not recommend any further workup and had signed off 2/22. However due to recurrent NTG responsive chest pain, cardiology has seen again on 2/23. EKG without acute changes and no LBBB at this time (LBBB is intermittent).  - Cardiology has increased dose of Imdur and beta blocker. Ranexa held secondary to renal insufficiency-consider resuming as renal function improves.  Right upper extremity pain and swelling - Reported on 2/23. Right upper extremity distal to elbow does appear diffusely swollen but not warm and is also tender without other features of cellulitis or arthritis. Good radial pulsation felt.? Secondary to IV fluids versus rule out DVT versus gout flare. - Elevate right upper extremity. Requested charge nurse to cut restricting band on right wrist. Get venous Doppler to rule out DVT (patient already anticoagulated though). May consider oral steroids if not  improving.  Hyponatremia -Secondary to  volume depletion - Sodium normal today.  Paroxysmal atrial fibrillation -Presently in sinus rhythm -Rate controlled -Continue warfarin. INR 3.29.  Warfarin-induced coagulopathy -Patient has supratherapeutic INR--8.43 on admission. Decreased. -As the patient is not actively bleeding and hemodynamically stable, allow INR to drift down -vitamin K if bleed or INR continues to rise  Hypertension -Continue carvedilol and Imdur. Dose increased by cardiology  SLE/positive anticardiolipin antibodies/history of DVT/PE:  -On chronic Coumadin at home. INR as above.. Status post renal transplant with chronic allograft nephropathy: Continue immunosuppressants  Anemia of CKD -Baseline hemoglobin 9-10 -transfuse one unit PRBC in setting of drop in Hgb and chest pain. Stable.  Right fibular and medial malleolus fracture -As per cardiology, patient would be high risk for surgery with a recent NSTEMI -continue CAM boot -outpt follow up with Dr. Ninfa Linden   DVT prophylaxis: Anticoagulated on Coumadin. CODE STATUS: Full Family Communication: None at bedside Disposition Plan: DC home when medically stable.    Procedures/Studies: Dg Ankle Complete Right  02/17/2015  CLINICAL DATA:  Right ankle pain and deformity, fall in hallway January 22, 2015 EXAM: RIGHT ANKLE - COMPLETE 3+ VIEW COMPARISON:  01/23/2015 FINDINGS: There is an oblique fracture of the lateral malleolus. There is transverse fracture of the medial malleolus. There is dislocation of the tibiotalar joint with lateral rotation of the talus. Although no definite posterior malleolar fracture is identified, there is significant artifact along the posterior aspect of the ankle on the lateral view. There is dense atherosclerotic calcification of the small vessels. IMPRESSION: 1. Bimalleolar fracture of the ankle. 2. Tibiotalar dislocation, increased significantly since prior study. Electronically  Signed   By: Nolon Nations M.D.   On: 02/17/2015 14:30   Ct Head Wo Contrast  03/14/2015  CLINICAL DATA:  Acute onset of dizziness.  Initial encounter. EXAM: CT HEAD WITHOUT CONTRAST TECHNIQUE: Contiguous axial images were obtained from the base of the skull through the vertex without intravenous contrast. COMPARISON:  CT of the head performed 02/14/2015 FINDINGS: There is no evidence of acute infarction, mass lesion, or intra- or extra-axial hemorrhage on CT. Prominence of the ventricles sulci reflects mild cortical volume loss. The brainstem and fourth ventricle are within normal limits. The basal ganglia are unremarkable in appearance. The cerebral hemispheres demonstrate grossly normal gray-white differentiation. No mass effect or midline shift is seen. There is no evidence of fracture; visualized osseous structures are unremarkable in appearance. The visualized portions of the orbits are within normal limits. The paranasal sinuses and mastoid air cells are well-aerated. No significant soft tissue abnormalities are seen. IMPRESSION: 1. No acute intracranial pathology seen on CT. 2. Mild cortical volume loss noted. Electronically Signed   By: Garald Balding M.D.   On: 03/14/2015 00:35   Ct Chest Wo Contrast  02/16/2015  CLINICAL DATA:  CHF exacerbation.  Kidney transplant patient. EXAM: CT CHEST WITHOUT CONTRAST TECHNIQUE: Multidetector CT imaging of the chest was performed following the standard protocol without IV contrast. COMPARISON:  08/27/2013 FINDINGS: Cardiac enlargement. Normal caliber thoracic aorta. Prominent Coronary artery and aortic calcifications. Esophagus is decompressed. No significant lymphadenopathy in the chest. Small right pleural effusion. Mild atelectasis in the lung bases. Perihilar airspace changes bilaterally and nodular ground-glass opacities on the right. These changes likely are due to edema or may be infectious or inflammatory. Suggest follow-up in 3 months to confirm  resolution.Focal area of atelectasis or scarring in the left mid lung, unchanged since previous study. No pneumothorax. Included portions of the upper abdominal organs demonstrate extensive vascular  calcifications in the abdominal aorta and major branch vessels. Bilateral renal atrophy. Surgical absence of the gallbladder. Degenerative changes in the spine. IMPRESSION: Small right pleural effusion. Patchy perihilar infiltrates and nodular infiltrates in the lungs. Changes likely represent infectious or inflammatory changes versus edema. Suggest follow-up in 3 months. Extensive vascular calcifications. Electronically Signed   By: Lucienne Capers M.D.   On: 02/16/2015 03:23   US Renal  03/15/2015  CLINICAL DATA:  Acute renal failure superimposed on stage 4 chronic kidney disease. EXAM: RENAL / URINARY TRACT ULTRASOUND COMPLETE COMPARISON:  Included portions from chest CT 02/18/2015 FINDINGS: Right Kidney: Not visualized. Left Kidney: Not visualized. Transplant kidney located in the pelvis, side not specified. There is no transplant hydronephrosis. Blood flow seen. Transplant kidney measures 10 cm. Transplant cortical thickness appears maintained. No focal lesion. Detailed transplant Doppler evaluation not performed. Bladder: Appears normal for degree of bladder distention. IMPRESSION: 1. Native kidneys not identified, likely secondary renal atrophy. 2. Pelvic transplant kidney without hydronephrosis. Electronically Signed   By: Jeb Levering M.D.   On: 03/15/2015 23:08   Dg Chest Portable 1 View  03/14/2015  CLINICAL DATA:  Fever and weakness tonight. EXAM: PORTABLE CHEST 1 VIEW COMPARISON:  Radiograph 02/18/2015.  Chest CT 02/16/2015 FINDINGS: Cardiomediastinal contours are unchanged allowing for patient rotation. Small left pleural effusion, diminished from prior. No evidence of right pleural effusion. Left basilar scarring. No pulmonary edema. No confluent airspace disease. No pneumothorax. IMPRESSION:  Persistent but decreased left pleural effusion. Exam is otherwise unchanged. Electronically Signed   By: Jeb Levering M.D.   On: 03/14/2015 02:09   Dg Chest Port 1 View  02/18/2015  CLINICAL DATA:  Central line placement EXAM: PORTABLE CHEST 1 VIEW COMPARISON:  02/16/2015 FINDINGS: Cardiomediastinal silhouette is stable. No pulmonary edema. Tiny right pleural effusion. There is left IJ central line with tip in SVC. No pneumothorax. No segmental infiltrate. IMPRESSION: Left IJ central line in place.  No pneumothorax. Electronically Signed   By: Lahoma Crocker M.D.   On: 02/18/2015 14:44        Subjective: Right upper extremity swelling and pain. Denies trauma. Intermittent chest pain-was not present when I interviewed and examined her this morning.  Objective: Filed Vitals:   03/17/15 [id number] 03/17/15 0615 03/17/15 0635 03/17/15 0742  BP: 172/72 156/69 166/66 171/70  Pulse: 94 89 88 90  Temp:    98.8 F (37.1 C)  TempSrc:    Oral  Resp: 19 20  18   Height:      Weight:      SpO2:  98% 100% 100%    Intake/Output Summary (Last 24 hours) at 03/17/15 1223 Last data filed at 03/17/15 1014  Gross per 24 hour  Intake 1688.33 ml  Output    100 ml  Net 1588.33 ml   Weight change: 0.192 kg (6.8 oz) Exam:   General:  Pt is alert, follows commands appropriately, not in acute distress.   HEENT: No icterus, No thrush, No neck mass, [place]/AT  Cardiovascular: RRR, S1/S2, no rubs, no gallops. Telemetry: Sinus rhythm.  Respiratory: CTA bilaterally, no wheezing, no crackles, no rhonchi  Abdomen: Soft/+BS, non tender, non distended, no guarding  Extremities: 1+RLE edema, No lymphangitis, No petechiae, No rashes, no synovitis. Right leg in cam boot. Right upper extremity diffusely swollen distal to elbow with mild tenderness but no erythema, increased warmth, crepitus or focal tenderness. Radial pulses well felt. No features of compartment syndrome.  Data Reviewed: Basic Metabolic  Panel:  Recent  Labs Lab 03/14/15 0426  03/14/15 1139 03/14/15 1503 03/15/15 0651 03/16/15 0509 03/17/15 0703  NA 130*  < > 130* 131* 131* 132* 136  K 5.1  < > 4.9 5.1 4.9 5.2* 4.3  CL 101  < > 101 101 103 103 107  CO2 21*  < > 19* 19* 19* 20* 22  GLUCOSE 124*  < > 104* 132* 148* 130* 124*  BUN 73*  < > 69* 69* 75* 76* 69*  CREATININE 4.32*  < > 4.10* 4.03* 4.23* 3.96* 3.41*  CALCIUM 9.0  < > 9.1 9.2 9.4 9.4 9.1  MG 2.2  --   --   --   --   --   --   < > = values in this interval not displayed. Liver Function Tests: No results for input(s): AST, ALT, ALKPHOS, BILITOT, PROT, ALBUMIN in the last 168 hours. No results for input(s): LIPASE, AMYLASE in the last 168 hours. No results for input(s): AMMONIA in the last 168 hours. CBC:  Recent Labs Lab 03/13/15 2039 03/14/15 0426 03/15/15 0651 03/16/15 0509 03/17/15 0703  WBC 10.1 8.0 7.9 9.4 6.4  NEUTROABS  --  6.0  --   --   --   HGB 8.2* 7.4* 9.2* 8.9* 9.1*  HCT 25.9* 22.7* 28.0* 27.6* 27.3*  MCV 81.4 81.1 82.8 83.4 85.8  PLT 199 174 174 173 155   Cardiac Enzymes:  Recent Labs Lab 03/14/15 0426 03/14/15 0730 03/14/15 1139 03/14/15 2139 03/15/15 0906 03/17/15 0703  CKTOTAL  --   --   --   --  29*  --   TROPONINI 0.05* 0.04* 0.05* 0.05*  --  0.04*   BNP: Invalid input(s): POCBNP CBG: No results for input(s): GLUCAP in the last 168 hours.  Recent Results (from the past 240 hour(s))  Blood culture (routine x 2)     Status: None   Collection Time: 03/14/15  1:15 AM  Result Value Ref Range Status   Specimen Description BLOOD BLOOD LEFT FOREARM  Final   Special Requests BOTTLES DRAWN AEROBIC AND ANAEROBIC 5CC  Final   Culture  Setup Time   Final    GRAM POSITIVE COCCI IN CLUSTERS IN BOTH AEROBIC AND ANAEROBIC BOTTLES CRITICAL RESULT CALLED TO, READ BACK BY AND VERIFIED WITH: Vivianne Spence RN 2017 03/14/15 A BROWNING    Culture   Final    ENTEROCOCCUS SPECIES SUSCEPTIBILITIES PERFORMED ON PREVIOUS CULTURE WITHIN THE  LAST 5 DAYS. STAPHYLOCOCCUS SPECIES (COAGULASE NEGATIVE) THE SIGNIFICANCE OF ISOLATING THIS ORGANISM FROM A SINGLE SET OF BLOOD CULTURES WHEN MULTIPLE SETS ARE DRAWN IS UNCERTAIN. PLEASE NOTIFY THE MICROBIOLOGY DEPARTMENT WITHIN ONE WEEK IF SPECIATION AND SENSITIVITIES ARE REQUIRED.    Report Status 03/17/2015 FINAL  Final  Blood culture (routine x 2)     Status: None   Collection Time: 03/14/15  1:33 AM  Result Value Ref Range Status   Specimen Description BLOOD RIGHT WRIST  Final   Special Requests BOTTLES DRAWN AEROBIC AND ANAEROBIC 5CC   Final   Culture  Setup Time   Final    GRAM POSITIVE COCCI IN CHAINS IN BOTH AEROBIC AND ANAEROBIC BOTTLES CRITICAL RESULT CALLED TO, READ BACK BY AND VERIFIED WITH: K MOORE,RN @0144  03/15/15 MKELLY    Culture ENTEROCOCCUS SPECIES  Final   Report Status 03/17/2015 FINAL  Final   Organism ID, Bacteria ENTEROCOCCUS SPECIES  Final      Susceptibility   Enterococcus species - MIC*    AMPICILLIN <=2 SENSITIVE Sensitive  VANCOMYCIN 1 SENSITIVE Sensitive     GENTAMICIN SYNERGY SENSITIVE Sensitive     * ENTEROCOCCUS SPECIES     Scheduled Meds: . allopurinol  200 mg Oral Daily  . atorvastatin  40 mg Oral q1800  . calcitRIOL  0.25 mcg Oral QODAY  . calcitRIOL  0.5 mcg Oral QODAY  . carvedilol  12.5 mg Oral BID WC  . DAPTOmycin (CUBICIN)  IV  575 mg Intravenous Q48H  . famotidine  20 mg Oral Daily  . feeding supplement (ENSURE ENLIVE)  237 mL Oral BID BM  . [START ON 03/18/2015] isosorbide mononitrate  90 mg Oral Daily  . latanoprost  1 drop Both Eyes QHS  . magnesium oxide  400 mg Oral BID  . mycophenolate  180 mg Oral BID  . predniSONE  5 mg Oral Q breakfast  . sodium bicarbonate  1,300 mg Oral BID  . tacrolimus  2 mg Oral BID  . warfarin  1 mg Oral ONCE-1800  . Warfarin - Pharmacist Dosing Inpatient   Does not apply q1800   Continuous Infusions: . sodium chloride 0.9 % 1,000 mL infusion 50 mL/hr at 03/17/15 1100   HONGALGI,ANAND, MD,  FACP, FHM. [facility] Pager [phone]  If 7PM-7AM, please contact night-coverage [email or web address] Password [facility] 03/17/2015, 12:23 PM   LOS: 3 days

## 2015-03-17 NOTE — Progress Notes (Signed)
Patient had another episode  of chest pain this morning, BP was up but remain sinus in the monitor, slightly short of breath O2 2l [place] was administered  NTG SL x2 was given, 12 lead EKG done. Pain level was 8/10 down to 6/10 and gradually with relief of pain.  K. SchorrNp was notified with orders made and Margarette Asal from cardiology was notified and aware. Will follow up this morning.

## 2015-03-17 NOTE — Progress Notes (Signed)
[place] for Warfarin  Indication: atrial fibrillation  Patient Measurements: Height: 5\' 1"  (154.9 cm) Weight: 155 lb 6.8 oz (70.5 kg) IBW/kg (Calculated) : 47.8  Vital Signs: Temp: 98.8 F (37.1 C) (02/23 0742) Temp Source: Oral (02/23 0742) BP: 171/70 mmHg (02/23 0742) Pulse Rate: 90 (02/23 0742)  Labs:  Recent Labs  03/14/15 1139  03/14/15 2139  03/15/15 0651 03/15/15 0906 03/16/15 0509 03/17/15 0703  HGB  --   --   --   < > 9.2*  --  8.9* 9.1*  HCT  --   --   --   --  28.0*  --  27.6* 27.3*  PLT  --   --   --   --  174  --  173 155  LABPROT  --   --   --   --  35.7*  --  34.6* 32.8*  INR  --   --   --   --  3.69*  --  3.53* 3.29*  CREATININE 4.10*  < >  --   --  4.23*  --  3.96* 3.41*  CKTOTAL  --   --   --   --   --  29*  --   --   TROPONINI 0.05*  --  0.05*  --   --   --   --  0.04*  < > = values in this interval not displayed.  Estimated Creatinine Clearance: 14.8 mL/min (by C-G formula based on Cr of 3.41).   Assessment: 15 YOF s/p kidney transplant here with dizziness. She was recenetly discharged after episode of AKI and NSTEMI which was treated conservatively. She is on warfarin PTA for hypercoagulable state and hx DVT. INR was ~8 on 2/17 in the anti-coag clinic and instructions were to hold doses, admit INR was also high at 8.43.  Vitamin K 2.5mg  po x1 given2/20, she was also transfused on 2/21.  INR down to 3.29 this morning. Hgb 9.1, Plts 155- stable, no bleeding noted.   Goal of Therapy:  INR 2-3 Monitor platelets by anticoagulation protocol: Yes   Plan:  -warfarin 1mg  po x1 tonight so INR does not drop too fast (the effects of this dose will not be seen until INR drawn 2/25) -Daily PT/INR -Monitor for bleeding  Barbara Keng D. Endya Austin, PharmD, BCPS Clinical Pharmacist Pager: [phone] 03/17/2015 8:44 AM

## 2015-03-17 NOTE — Progress Notes (Signed)
Patient Name:  Debbie [name], DOB: 10-25-1950, MRN: [id number] Primary Doctor: No PCP Per Patient Primary Cardiologist  Date: 03/17/2015   SUBJECTIVE: Patient has had recurrent chest discomfort in the hospital. It appears to be nitroglycerin responsive. Early in the hospitalization her EKG revealed left bundle branch block. This appears to be intermittent. Her current EKG showing narrow complex QRS with no acute ST changes. Historically she had been on a nitrate, beta blocker, and Ranexa. She is not on Ranexa in the hospital. In the room today she is complaining of right arm pain. There is swelling and some pain with motion. I've spoken with her nurse. This arm pain is not the same as her chest pain.   Past Medical History  Diagnosis Date  . Kidney transplant as cause of abnormal reaction or later complication   . Hypertension   . Colon polyps   . Gall stones 1980  . Anti-phospholipid antibody syndrome [facility])     (Based on hospital discharge summary march 2013)  . Paroxysmal atrial fibrillation (HCC)   . Carpal tunnel syndrome 2003    lt  . Esophageal stricture   . Hemorrhoids   . Renal disorder   . ESRD (end stage renal disease) on dialysis [facility])     "til I got my transplant in 2010"  . DVT (deep venous thrombosis) (HCC) 1990    LLE  . History of blood transfusion     "related to the lupus"  . Dyslipidemia 07/22/2013  . Coronary artery disease   . Heart murmur     mild MR on echo  . Pneumonia     "3 times now" (08/25/2013)  . GERD (gastroesophageal reflux disease)   . Arthritis     "fingers" (08/25/2013)  . Glaucoma, left eye   . Bilateral carotid artery stenosis     1-39%  . Pulmonary HTN (HCC)     PASP 3mmHg   Filed Vitals:   03/17/15 0609 03/17/15 0615 03/17/15 0635 03/17/15 0742  BP: 172/72 156/69 166/66 171/70  Pulse: 94 89 88 90  Temp:    98.8 F (37.1 C)  TempSrc:    Oral  Resp: 19 20  18   Height:      Weight:      SpO2:  98% 100% 100%     Intake/Output Summary (Last 24 hours) at 03/17/15 1053 Last data filed at 03/17/15 1014  Gross per 24 hour  Intake 1688.33 ml  Output    100 ml  Net 1588.33 ml   Filed Weights   03/14/15 2047 03/15/15 2045 03/16/15 2128  Weight: 145 lb 15.1 oz (66.2 kg) 155 lb (70.308 kg) 155 lb 6.8 oz (70.5 kg)     LABS: Basic Metabolic Panel:  Recent Labs  03/16/15 0509 03/17/15 0703  NA 132* 136  K 5.2* 4.3  CL 103 107  CO2 20* 22  GLUCOSE 130* 124*  BUN 76* 69*  CREATININE 3.96* 3.41*  CALCIUM 9.4 9.1   Liver Function Tests: No results for input(s): AST, ALT, ALKPHOS, BILITOT, PROT, ALBUMIN in the last 72 hours. No results for input(s): LIPASE, AMYLASE in the last 72 hours. CBC:  Recent Labs  03/16/15 0509 03/17/15 0703  WBC 9.4 6.4  HGB 8.9* 9.1*  HCT 27.6* 27.3*  MCV 83.4 85.8  PLT 173 155   Cardiac Enzymes:  Recent Labs  03/14/15 1139 03/14/15 2139 03/15/15 0906 03/17/15 0703  CKTOTAL  --   --  29*  --  TROPONINI 0.05* 0.05*  --  0.04*   BNP: Invalid input(s): POCBNP D-Dimer: No results for input(s): DDIMER in the last 72 hours. Thyroid Function Tests: No results for input(s): TSH, T4TOTAL, T3FREE, THYROIDAB in the last 72 hours.  Invalid input(s): FREET3  RADIOLOGY: Dg Chest 1 View  02/15/2015  CLINICAL DATA:  65 year old personal history of kidney transplant, currently with chest pain and acute onset of severe shortness of breath earlier today. EXAM: Portable CHEST 1 VIEW COMPARISON:  04/26/2014 and earlier. FINDINGS: Cardiac silhouette mildly to moderately enlarged, unchanged. Moderate diffuse interstitial and airspace pulmonary edema. Small bilateral pleural effusions. IMPRESSION: CHF, with stable cardiomegaly and moderate diffuse interstitial and airspace pulmonary edema associated with small bilateral effusions. Electronically Signed   By: Evangeline Dakin M.D.   On: 02/15/2015 12:45   Dg Ankle Complete Right  02/17/2015  CLINICAL DATA:  Right  ankle pain and deformity, fall in hallway January 22, 2015 EXAM: RIGHT ANKLE - COMPLETE 3+ VIEW COMPARISON:  01/23/2015 FINDINGS: There is an oblique fracture of the lateral malleolus. There is transverse fracture of the medial malleolus. There is dislocation of the tibiotalar joint with lateral rotation of the talus. Although no definite posterior malleolar fracture is identified, there is significant artifact along the posterior aspect of the ankle on the lateral view. There is dense atherosclerotic calcification of the small vessels. IMPRESSION: 1. Bimalleolar fracture of the ankle. 2. Tibiotalar dislocation, increased significantly since prior study. Electronically Signed   By: Nolon Nations M.D.   On: 02/17/2015 14:30   Ct Head Wo Contrast  03/14/2015  CLINICAL DATA:  Acute onset of dizziness.  Initial encounter. EXAM: CT HEAD WITHOUT CONTRAST TECHNIQUE: Contiguous axial images were obtained from the base of the skull through the vertex without intravenous contrast. COMPARISON:  CT of the head performed 02/14/2015 FINDINGS: There is no evidence of acute infarction, mass lesion, or intra- or extra-axial hemorrhage on CT. Prominence of the ventricles sulci reflects mild cortical volume loss. The brainstem and fourth ventricle are within normal limits. The basal ganglia are unremarkable in appearance. The cerebral hemispheres demonstrate grossly normal gray-white differentiation. No mass effect or midline shift is seen. There is no evidence of fracture; visualized osseous structures are unremarkable in appearance. The visualized portions of the orbits are within normal limits. The paranasal sinuses and mastoid air cells are well-aerated. No significant soft tissue abnormalities are seen. IMPRESSION: 1. No acute intracranial pathology seen on CT. 2. Mild cortical volume loss noted. Electronically Signed   By: Garald Balding M.D.   On: 03/14/2015 00:35   Ct Chest Wo Contrast  02/16/2015  CLINICAL DATA:   CHF exacerbation.  Kidney transplant patient. EXAM: CT CHEST WITHOUT CONTRAST TECHNIQUE: Multidetector CT imaging of the chest was performed following the standard protocol without IV contrast. COMPARISON:  08/27/2013 FINDINGS: Cardiac enlargement. Normal caliber thoracic aorta. Prominent Coronary artery and aortic calcifications. Esophagus is decompressed. No significant lymphadenopathy in the chest. Small right pleural effusion. Mild atelectasis in the lung bases. Perihilar airspace changes bilaterally and nodular ground-glass opacities on the right. These changes likely are due to edema or may be infectious or inflammatory. Suggest follow-up in 3 months to confirm resolution.Focal area of atelectasis or scarring in the left mid lung, unchanged since previous study. No pneumothorax. Included portions of the upper abdominal organs demonstrate extensive vascular calcifications in the abdominal aorta and major branch vessels. Bilateral renal atrophy. Surgical absence of the gallbladder. Degenerative changes in the spine. IMPRESSION: Small right pleural effusion.  Patchy perihilar infiltrates and nodular infiltrates in the lungs. Changes likely represent infectious or inflammatory changes versus edema. Suggest follow-up in 3 months. Extensive vascular calcifications. Electronically Signed   By: Lucienne Capers M.D.   On: 02/16/2015 03:23   US Renal  03/15/2015  CLINICAL DATA:  Acute renal failure superimposed on stage 4 chronic kidney disease. EXAM: RENAL / URINARY TRACT ULTRASOUND COMPLETE COMPARISON:  Included portions from chest CT 02/18/2015 FINDINGS: Right Kidney: Not visualized. Left Kidney: Not visualized. Transplant kidney located in the pelvis, side not specified. There is no transplant hydronephrosis. Blood flow seen. Transplant kidney measures 10 cm. Transplant cortical thickness appears maintained. No focal lesion. Detailed transplant Doppler evaluation not performed. Bladder: Appears normal for degree  of bladder distention. IMPRESSION: 1. Native kidneys not identified, likely secondary renal atrophy. 2. Pelvic transplant kidney without hydronephrosis. Electronically Signed   By: Jeb Levering M.D.   On: 03/15/2015 23:08   Dg Chest Portable 1 View  03/14/2015  CLINICAL DATA:  Fever and weakness tonight. EXAM: PORTABLE CHEST 1 VIEW COMPARISON:  Radiograph 02/18/2015.  Chest CT 02/16/2015 FINDINGS: Cardiomediastinal contours are unchanged allowing for patient rotation. Small left pleural effusion, diminished from prior. No evidence of right pleural effusion. Left basilar scarring. No pulmonary edema. No confluent airspace disease. No pneumothorax. IMPRESSION: Persistent but decreased left pleural effusion. Exam is otherwise unchanged. Electronically Signed   By: Jeb Levering M.D.   On: 03/14/2015 02:09   Dg Chest Port 1 View  02/18/2015  CLINICAL DATA:  Central line placement EXAM: PORTABLE CHEST 1 VIEW COMPARISON:  02/16/2015 FINDINGS: Cardiomediastinal silhouette is stable. No pulmonary edema. Tiny right pleural effusion. There is left IJ central line with tip in SVC. No pneumothorax. No segmental infiltrate. IMPRESSION: Left IJ central line in place.  No pneumothorax. Electronically Signed   By: Lahoma Crocker M.D.   On: 02/18/2015 14:44    PHYSICAL EXAM  the patient is uncomfortable with pain in her arm. There is some swelling of her right arm. There is an identification wrist band on her arm which is too tight. I have instructed the nurse to cut this off. Lungs reveal scattered rhonchi. Cardiac exam reveals S1 and S2.   TELEMETRY: I have personally reviewed telemetry today March 17, 2015. There is normal sinus rhythm.  ECG:   I reviewed old and current EKGs. She has intermittent left bundle branch block. Her current EKG does not show any acute ST changes. There is a narrow QRS.  ASSESSMENT AND PLAN:     Acute renal failure superimposed on stage 4 chronic kidney disease (HCC)      Creatinine is improving    HTN (hypertension)    Blood pressure is high today and her heart rate is increased. I'm increasing the dose of her isosorbide and beta blocker.     NSTEMI- Jan 2017- medical Rx    She has had some recurring chest pain in the hospital. By report it is responsive to nitroglycerin. EKGs do not show any acute changes. Her EKGs do not show her left bundle at this time. There is no acute ST change. She does have mild resting sinus tachycardia. Her blood pressure is high. I've increased her isosorbide dose and her beta blocker dose. She had been on Ranexa prior to admission. I would be in favor of resuming this if it is felt by the other physicians that this is safe from the viewpoint of her renal function.    LBBB (left bundle branch  block)    It appears that her left bundle-branch block is intermittent. It is not present today.     Bacteremia     Bacteremia is being evaluated and treated along with infectious disease team.    right arm pain.     At this time she has pain in the right arm. Her nurse feels that this is not the same as her ischemic chest pain. She has some swelling of the right arm. There is an identification band that is too tight. I've instructed the nurse to cut this off. I'm not sure if this plays a role with her right arm discomfort or not.   Dola Argyle 03/17/2015 10:53 AM

## 2015-03-17 NOTE — Progress Notes (Signed)
Pt c/o chest pain 9/10. Gave 1 NTG SL and pain decreased to 1/10.   Update 1240: pt called RN back to room saying pain is now 8/10. Agricultural consultant gave a second NTG SL. VS- 163/80, 100% on RA, HR 103, Res 18. Obtaining EKG. Dr. Algis Liming notified as well as Dr. Ron Parker from [place].

## 2015-03-17 NOTE — Progress Notes (Signed)
Subjective:  She is complaining of severe right forearm upper arm and right shoulder pain   Antibiotics:  Anti-infectives    Start     Dose/Rate Route Frequency Ordered Stop   03/17/15 1100  DAPTOmycin (CUBICIN) 575 mg in sodium chloride 0.9 % IVPB     575 mg 223 mL/hr over 30 Minutes Intravenous Every 48 hours 03/16/15 1122     03/15/15 1100  DAPTOmycin (CUBICIN) 530 mg in sodium chloride 0.9 % IVPB  Status:  Discontinued     530 mg 221.2 mL/hr over 30 Minutes Intravenous Every 48 hours 03/15/15 0847 03/16/15 1122   03/14/15 2230  linezolid (ZYVOX) IVPB 600 mg  Status:  Discontinued     600 mg 300 mL/hr over 60 Minutes Intravenous Every 12 hours 03/14/15 2220 03/15/15 0830      Medications: Scheduled Meds: . allopurinol  200 mg Oral Daily  . atorvastatin  40 mg Oral q1800  . calcitRIOL  0.25 mcg Oral QODAY  . calcitRIOL  0.5 mcg Oral QODAY  . carvedilol  12.5 mg Oral BID WC  . DAPTOmycin (CUBICIN)  IV  575 mg Intravenous Q48H  . famotidine  20 mg Oral Daily  . feeding supplement (NEPRO CARB STEADY)  237 mL Oral BID BM  . feeding supplement (PRO-STAT SUGAR FREE 64)  30 mL Oral Daily  . [START ON 03/18/2015] isosorbide mononitrate  90 mg Oral Daily  . latanoprost  1 drop Both Eyes QHS  . magnesium oxide  400 mg Oral BID  . mycophenolate  180 mg Oral BID  . predniSONE  40 mg Oral Once  . predniSONE  5 mg Oral Q breakfast  . sodium bicarbonate  1,300 mg Oral BID  . tacrolimus  2 mg Oral BID  . warfarin  1 mg Oral ONCE-1800  . Warfarin - Pharmacist Dosing Inpatient   Does not apply q1800   Continuous Infusions: . sodium chloride 0.9 % 1,000 mL infusion 50 mL/hr at 03/17/15 1100   PRN Meds:.acetaminophen **OR** acetaminophen, fentaNYL (SUBLIMAZE) injection, nitroGLYCERIN, ondansetron **OR** ondansetron (ZOFRAN) IV, oxyCODONE-acetaminophen    Objective: Weight change: 6.8 oz (0.192 kg)  Intake/Output Summary (Last 24 hours) at 03/17/15 1533 Last data filed  at 03/17/15 1014  Gross per 24 hour  Intake 1568.33 ml  Output    100 ml  Net 1468.33 ml   Blood pressure 163/80, pulse 103, temperature 98.8 F (37.1 C), temperature source Oral, resp. rate 18, height 5\' 1"  (1.549 m), weight 155 lb 6.8 oz (70.5 kg), SpO2 100 %. Temp:  [98.5 F (36.9 C)-99.1 F (37.3 C)] 98.8 F (37.1 C) (02/23 0742) Pulse Rate:  [78-103] 103 (02/23 1251) Resp:  [18-20] 18 (02/23 1251) BP: (138-172)/(49-80) 163/80 mmHg (02/23 1251) SpO2:  [97 %-100 %] 100 % (02/23 1251) Weight:  [155 lb 6.8 oz (70.5 kg)] 155 lb 6.8 oz (70.5 kg) (02/22 2128)  Physical Exam: General: Alert and awake, oriented x3,  HEENT: anicteric sclera,  Cardiovascular: irr, irr rate, normal r, no murmur rubs or gallops Pulmonary: clear to auscultation bilaterally, no wheezing, rales or rhonchi Gastrointestinal: soft nontender, nondistended, normal bowel sounds, Musculoskeletal:right ankle in cast, right older is exquisitely tender to palpation as is her upper arm and lower arm where she had prior fistula Skin, soft tissue: no rashes Neuro: nonfocal, strength and sensation intact  CBC:  CBC Latest Ref Rng 03/17/2015 03/16/2015 03/15/2015  WBC 4.0 - 10.5 K/uL 6.4 9.4 7.9  Hemoglobin 12.0 -  15.0 g/dL 9.1(L) 8.9(L) 9.2(L)  Hematocrit 36.0 - 46.0 % 27.3(L) 27.6(L) 28.0(L)  Platelets 150 - 400 K/uL 155 173 174      BMET  Recent Labs  03/16/15 0509 03/17/15 0703  NA 132* 136  K 5.2* 4.3  CL 103 107  CO2 20* 22  GLUCOSE 130* 124*  BUN 76* 69*  CREATININE 3.96* 3.41*  CALCIUM 9.4 9.1     Liver Panel  No results for input(s): PROT, ALBUMIN, AST, ALT, ALKPHOS, BILITOT, BILIDIR, IBILI in the last 72 hours.     Sedimentation Rate No results for input(s): ESRSEDRATE in the last 72 hours. C-Reactive Protein No results for input(s): CRP in the last 72 hours.  Micro Results: Recent Results (from the past 720 hour(s))  Blood culture (routine x 2)     Status: None   Collection  Time: 03/14/15  1:15 AM  Result Value Ref Range Status   Specimen Description BLOOD BLOOD LEFT FOREARM  Final   Special Requests BOTTLES DRAWN AEROBIC AND ANAEROBIC 5CC  Final   Culture  Setup Time   Final    GRAM POSITIVE COCCI IN CLUSTERS IN BOTH AEROBIC AND ANAEROBIC BOTTLES CRITICAL RESULT CALLED TO, READ BACK BY AND VERIFIED WITH: Vivianne Spence RN 2017 03/14/15 A BROWNING    Culture   Final    ENTEROCOCCUS SPECIES SUSCEPTIBILITIES PERFORMED ON PREVIOUS CULTURE WITHIN THE LAST 5 DAYS. STAPHYLOCOCCUS SPECIES (COAGULASE NEGATIVE) THE SIGNIFICANCE OF ISOLATING THIS ORGANISM FROM A SINGLE SET OF BLOOD CULTURES WHEN MULTIPLE SETS ARE DRAWN IS UNCERTAIN. PLEASE NOTIFY THE MICROBIOLOGY DEPARTMENT WITHIN ONE WEEK IF SPECIATION AND SENSITIVITIES ARE REQUIRED.    Report Status 03/17/2015 FINAL  Final  Blood culture (routine x 2)     Status: None   Collection Time: 03/14/15  1:33 AM  Result Value Ref Range Status   Specimen Description BLOOD RIGHT WRIST  Final   Special Requests BOTTLES DRAWN AEROBIC AND ANAEROBIC 5CC   Final   Culture  Setup Time   Final    GRAM POSITIVE COCCI IN CHAINS IN BOTH AEROBIC AND ANAEROBIC BOTTLES CRITICAL RESULT CALLED TO, READ BACK BY AND VERIFIED WITH: K MOORE,RN @0144  03/15/15 MKELLY    Culture ENTEROCOCCUS SPECIES  Final   Report Status 03/17/2015 FINAL  Final   Organism ID, Bacteria ENTEROCOCCUS SPECIES  Final      Susceptibility   Enterococcus species - MIC*    AMPICILLIN <=2 SENSITIVE Sensitive     VANCOMYCIN 1 SENSITIVE Sensitive     GENTAMICIN SYNERGY SENSITIVE Sensitive     * ENTEROCOCCUS SPECIES  Culture, blood (Routine X 2) w Reflex to ID Panel     Status: None (Preliminary result)   Collection Time: 03/16/15  5:00 AM  Result Value Ref Range Status   Specimen Description BLOOD LEFT HAND  Final   Special Requests AEROBIC BOTTLE ONLY 5ML  Final   Culture NO GROWTH 1 DAY  Final   Report Status PENDING  Incomplete  Culture, blood (Routine X 2) w Reflex  to ID Panel     Status: None (Preliminary result)   Collection Time: 03/16/15  5:04 AM  Result Value Ref Range Status   Specimen Description BLOOD LEFT FINGER  Final   Special Requests IN PEDIATRIC BOTTLE  Final   Culture NO GROWTH 1 DAY  Final   Report Status PENDING  Incomplete    Studies/Results: US Renal  03/15/2015  CLINICAL DATA:  Acute renal failure superimposed on stage 4 chronic kidney disease.  EXAM: RENAL / URINARY TRACT ULTRASOUND COMPLETE COMPARISON:  Included portions from chest CT 02/18/2015 FINDINGS: Right Kidney: Not visualized. Left Kidney: Not visualized. Transplant kidney located in the pelvis, side not specified. There is no transplant hydronephrosis. Blood flow seen. Transplant kidney measures 10 cm. Transplant cortical thickness appears maintained. No focal lesion. Detailed transplant Doppler evaluation not performed. Bladder: Appears normal for degree of bladder distention. IMPRESSION: 1. Native kidneys not identified, likely secondary renal atrophy. 2. Pelvic transplant kidney without hydronephrosis. Electronically Signed   By: Jeb Levering M.D.   On: 03/15/2015 23:08      Assessment/Plan:  INTERVAL HISTORY:  03/17/15: now BOTH cultures are growing Enterococcus   Principal Problem:   Acute renal failure superimposed on stage 4 chronic kidney disease (HCC) Active Problems:   CKD (chronic kidney disease), stage IV (HCC)   PAF (paroxysmal atrial fibrillation) (HCC)   HTN (hypertension)   Chronic anticoagulation - with Coumadin   Dyslipidemia   Pulmonary hypertension ([place])   NSTEMI- Jan 2017- medical Rx   Renal transplant recipient   Coagulopathy ([place])   Cardiomyopathy, ischemic-40-45%   Supratherapeutic INR   LBBB (left bundle branch block)   Gram-positive bacteremia   Enterococcal bacteremia   Bacteremia   Arm pain, right    Debbie [name] is a 65 y.o. female with  65 y.o. female with renal transplant on immunosuppressive drugs with recent  admission for NSTEMI now admitted with dizziness, weakness and fevers and found to have Enterococcus  2/2 blood cultures, And Coag Neg Staph in 1/2 blood cultures she is now with severe right shoulder, forearm and arm pain  #1 Enterococcal bacteremia: we are now seeing the Enterococcus in BOTH blood cultures so it is the "real deal"  I have NOW much greater concern for endocarditis esp and is highly immunocompromised patient without a clear-cut source.  This morning when I talk to her she was really much against undergoing transesophageal echocardiogram but now I feel there is much stronger reason to get one.  Given her chronic kidney disease I'm not wine to exposure to gentamicin so instead I more in favor of trying to treat her with dual beta-lactam's.  One of the problems though is that she has had apparent itching while on penicillins and cephalosporins.  I will start her on high-dose ampicillin today and discontinue her daptomycin and see how well she tolerates this first and if she does well will add high-dose ceftriaxone 2 g every 12 to the right eye does ampicillin. Note dual beta-lactam therapy does increase risk for seizures but it will obviate the risk of nephrotoxicity that we would get be getting into with an aminoglycoside. If her allergies do not permit beta lactams therapy then we will have to go back to the cubicin  Repeat blood cultures are incubating  #2 Right shoulder, arm and forearm pain: Would like to have gotten an MRI but she says she was not ill to tolerate it so get a CT without contrast of this region.  #2 Coag Neg staph: This is a contaminant. Fortunately the enterococcus grew anyway on this blood culture otherwise this contaminant could've obfuscated her true bacteremia        LOS: 3 days   Alcide Evener 03/17/2015, 3:33 PM

## 2015-03-17 NOTE — Progress Notes (Signed)
Nutrition Follow-up  DOCUMENTATION CODES:   Not applicable  INTERVENTION:  Discontinue Ensure.   Provide Nepro Shake po BID, each supplement provides 425 kcal and 19 grams protein.  Provide 30 ml Prostat po once daily, each supplement provides 100 kcal and 15 grams of protein.   Encourage adequate PO intake.   NUTRITION DIAGNOSIS:   Inadequate oral intake related to poor appetite as evidenced by per patient/family report, meal completion < 50%; ongoing  GOAL:   Patient will meet greater than or equal to 90% of their needs;   MONITOR:   PO intake, Supplement acceptance, Labs, Weight trends  REASON FOR ASSESSMENT:   Malnutrition Screening Tool    ASSESSMENT:   65 y.o. female with hx of renal transplant with stage III renal disease, who was recently admitted for acute on chronic renal disease with non-ST elevation MI.  Pt presents complaining of dizziness and weakness since yesterday morning. States that over the last few days pt has not been eating well. Denies any vomiting, abdominal pain, but has had some constipation.  Meal completion has been 25-50%. Pt currently has Ensure ordered and has been consuming them. Diet is now a renal diet with 1200 ml fluids. RD to order Nepro Shake instead of Ensure. RD to additionally order Prostat to aid in protein needs. Pt encouraged to eat her food at meals and to drink her supplements.   Labs and medications reviewed.   Diet Order:  Diet renal with fluid restriction Fluid restriction:: 1200 mL Fluid; Room service appropriate?: Yes; Fluid consistency:: Thin  Skin:  Wound (see comment) (wound on sacrum)  Last BM:  2/22  Height:   Ht Readings from Last 1 Encounters:  03/14/15 5\' 1"  (1.549 m)    Weight:   Wt Readings from Last 1 Encounters:  03/16/15 155 lb 6.8 oz (70.5 kg)    Ideal Body Weight:  47.7 kg  BMI:  Body mass index is 29.38 kg/(m^2).  Estimated Nutritional Needs:   Kcal:  1700-1900  Protein:  75-85  grams  Fluid:  1.2 L/day  EDUCATION NEEDS:   Education needs addressed  Corrin Parker, MS, RD, LDN Pager # [phone] After hours/ weekend pager # [phone]

## 2015-03-17 NOTE — Progress Notes (Signed)
Preliminary results by tech - Right Upper Ext. Venous Duplex Competed. No evidence of obvious deep vein or superficial vein thrombosis in the right upper extremity. Oda Cogan, BS, RDMS, RVT

## 2015-03-17 NOTE — Care Management Important Message (Signed)
Important Message  Patient Details  Name: Debbie [name] MRN: [id number] Date of Birth: 06-18-1950   Medicare Important Message Given:  Yes    Barb Merino [name] 03/17/2015, 4:39 PM

## 2015-03-17 NOTE — Progress Notes (Signed)
Called by RN for "second det of eyes".  Upon  My arrival to patients bedside NT at bedside with EKG machine.  Patient sitting up in bed, states her CP is better now but keeps recurring this am.  VSS. Patient denies CP, SOB at this time. Requesting to use bedpan.  Rn to call if assistance needed

## 2015-03-18 ENCOUNTER — Other Ambulatory Visit: Payer: Self-pay

## 2015-03-18 DIAGNOSIS — M009 Pyogenic arthritis, unspecified: Secondary | ICD-10-CM | POA: Insufficient documentation

## 2015-03-18 LAB — RENAL FUNCTION PANEL
Albumin: 2.2 g/dL — ABNORMAL LOW (ref 3.5–5.0)
Anion gap: 10 (ref 5–15)
BUN: 58 mg/dL — AB (ref 6–20)
CALCIUM: 9.6 mg/dL (ref 8.9–10.3)
CHLORIDE: 103 mmol/L (ref 101–111)
CO2: 21 mmol/L — AB (ref 22–32)
CREATININE: 2.69 mg/dL — AB (ref 0.44–1.00)
GFR calc non Af Amer: 17 mL/min — ABNORMAL LOW (ref >60)
GFR, EST AFRICAN AMERICAN: 20 mL/min — AB (ref >60)
GLUCOSE: 182 mg/dL — AB (ref 65–99)
Phosphorus: 3.4 mg/dL (ref 2.5–4.6)
Potassium: 4.7 mmol/L (ref 3.5–5.1)
SODIUM: 134 mmol/L — AB (ref 135–145)

## 2015-03-18 LAB — URINALYSIS, ROUTINE W REFLEX MICROSCOPIC
Bilirubin Urine: NEGATIVE
Glucose, UA: 100 mg/dL — AB
KETONES UR: NEGATIVE mg/dL
NITRITE: NEGATIVE
PH: 7 (ref 5.0–8.0)
Protein, ur: 30 mg/dL — AB
Specific Gravity, Urine: 1.017 (ref 1.005–1.030)

## 2015-03-18 LAB — URINE MICROSCOPIC-ADD ON

## 2015-03-18 LAB — PROTIME-INR
INR: 2.74 — AB (ref 0.00–1.49)
PROTHROMBIN TIME: 28.6 s — AB (ref 11.6–15.2)

## 2015-03-18 MED ORDER — DARBEPOETIN ALFA 100 MCG/0.5ML IJ SOSY
100.0000 ug | PREFILLED_SYRINGE | INTRAMUSCULAR | Status: DC
  Administered 2015-03-19 – 2015-03-26 (×2): 100 ug via SUBCUTANEOUS
  Filled 2015-03-18 (×2): qty 0.5

## 2015-03-18 MED ORDER — RANOLAZINE ER 500 MG PO TB12
500.0000 mg | ORAL_TABLET | Freq: Two times a day (BID) | ORAL | Status: DC
  Administered 2015-03-18 – 2015-03-28 (×21): 500 mg via ORAL
  Filled 2015-03-18 (×22): qty 1

## 2015-03-18 MED ORDER — AMLODIPINE BESYLATE 5 MG PO TABS
5.0000 mg | ORAL_TABLET | Freq: Every day | ORAL | Status: DC
  Administered 2015-03-18 – 2015-03-28 (×11): 5 mg via ORAL
  Filled 2015-03-18 (×10): qty 1

## 2015-03-18 MED ORDER — SODIUM CHLORIDE 0.9 % IV SOLN
2.0000 g | Freq: Two times a day (BID) | INTRAVENOUS | Status: DC
  Administered 2015-03-18 – 2015-03-21 (×7): 2 g via INTRAVENOUS
  Filled 2015-03-18 (×8): qty 2000

## 2015-03-18 MED ORDER — WARFARIN SODIUM 2 MG PO TABS
2.0000 mg | ORAL_TABLET | Freq: Once | ORAL | Status: AC
  Administered 2015-03-18: 2 mg via ORAL
  Filled 2015-03-18: qty 1

## 2015-03-18 NOTE — Progress Notes (Signed)
ANTICOAGULATION CONSULT NOTE  Pharmacy Consult for Warfarin  Indication: atrial fibrillation  Patient Measurements: Height: 5\' 1"  (154.9 cm) Weight: 155 lb 10.3 oz (70.6 kg) IBW/kg (Calculated) : 47.8  Vital Signs: Temp: 97.8 F (36.6 C) (02/24 0744) Temp Source: Oral (02/24 0744) BP: 162/69 mmHg (02/24 0744) Pulse Rate: 84 (02/24 0744)  Labs:  Recent Labs  03/16/15 0509 03/17/15 0703 03/17/15 1211 03/17/15 1819 03/18/15 0530  HGB 8.9* 9.1*  --   --   --   HCT 27.6* 27.3*  --   --   --   PLT 173 155  --   --   --   LABPROT 34.6* 32.8*  --   --  28.6*  INR 3.53* 3.29*  --   --  2.74*  CREATININE 3.96* 3.41*  --   --  2.69*  TROPONINI  --  0.04* 0.04* 0.08*  --     Estimated Creatinine Clearance: 18.7 mL/min (by C-G formula based on Cr of 2.69).   Assessment: 39 YOF s/p kidney transplant here with dizziness. She was recenetly discharged after episode of AKI and NSTEMI which was treated conservatively. She is on warfarin PTA for hypercoagulable state and hx DVT. INR was ~8 on 2/17 in the anti-coag clinic and instructions were to hold doses, admit INR was also high at 8.43.  Vitamin K 2.5mg  po x1 given 2/20, she was also transfused on 2/21.  INR down to 2.74 this morning. Hgb 9.1, Plts 155- stable, no bleeding noted.   Goal of Therapy:  INR 2-3 Monitor platelets by anticoagulation protocol: Yes   Plan:  -warfarin 2mg  po x1 tonight -Daily PT/INR -Monitor for bleeding  Kurtis Anastasia D. Laurin Morgenstern, PharmD, BCPS Clinical Pharmacist Pager: [phone] 03/18/2015 10:29 AM

## 2015-03-18 NOTE — Progress Notes (Signed)
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 Jacques Navy appointment 02/16/15) presented to [facility] with 1 day history of dizziness and generalized weakness. The patient was recently discharged from the hospital on 02/21/2015 after treatment for NSTEMI and acute on chronic renal failure. The patient states that she has had poor by mouth intake for the past 2-3 days with constipation. Upon presentation, she was noted to have a serum creatinine of 4.31. The patient received 2 L bolus of fluid in the emergency department. She was also found to have INR of 8.14. On the afternoon of 03/14/2015, the patient developed substernal chest pain for which she received nitroglycerin with relief. Cardiology was consulted. EKG showed a new LBBB.  Assessment/Plan: Acute on chronic renal failure (CKD 4) in pt with renal transplant -due to volume depletion and infection -restarted judicious IVF -baseline creatinine 2.2-2.3 -Patient endorses compliance with all her medications including immunosuppressive therapy -renal ultrasound: Native kidneys not identified possibly due to atrophy. Transplanted kidney without hydronephrosis. - Creatinine was 2.2 on 1/30. Admitted with creatinine of 4.31 and is gradually improving (3.41 on 2/23). - Requested nephrology consultation given complex renal issues and  patient wishes to speak with them prior to deciding on TEE. - Creatinine down to 2.6 which may be close to her baseline. IV fluids discontinued. Nephrology follow-up appreciated.  Mild hyperkalemia - Resolved after a dose of Kayexalate.  Enterococcus bacteremia -source unclear. One of 2 blood cultures positive for enterococcus. 2nd blood culture positive for enterococcus and Coag neg staphylococcus. -pt has anaphylaxis to vancomycin - Infectious disease follow-up appreciated. Surveillance blood cultures ordered. ID recommends TEE - she wants [place] to discuss with her nephrologist -consulted. Patient is not keen on TEE but is contemplating same. - Cubicin changed to IV ampicillin. Surveillance blood culture also positive. Patient still very reluctant to undergo TEE and wants to speak with her family.  Chest pain and new LBBB on EKG -Concerning for angina with new left bundle branch block -relieved after SL NTG x 1 -EKG--new LBBB -pt had NSTEMI last admission in Jan 2017 -02/15/2015 echo EF 40-45%, PAP 60 -continue ASA -continue imdur/Ranexa -defer Ranexa pharmacy concern to cardiology -elevated troponins likely due to renal failure - Cardiology follow-up appreciated: Suspect anemia contribute it to her angina. Continue carvedilol, Lipitor, Imdur and Ranexa. Cardiology does not recommend any further workup and had signed off 2/22. However due to recurrent NTG responsive chest pain, cardiology has seen again on 2/23. EKG without acute changes and no LBBB at this time (LBBB is intermittent).  - Cardiology has increased dose of Imdur and beta blocker. Ranexa held secondary to renal insufficiency-consider resuming as renal function improves.  Right upper extremity pain and swelling - Reported on 2/23. Right upper extremity distal to elbow does appear diffusely swollen but not warm and is also tender without other features of cellulitis or arthritis. Good radial pulsation felt.? Secondary to IV  fluids versus rule out DVT versus gout  flare. - Elevated right upper extremity. Requested charge nurse to cut restricting band on right wrist. Right upper extremity venous Doppler negative for DVT. Did receive a dose of prednisone 40 mg on 2/23. Right upper extremity swelling and pain improved. As per nephrology, she may need further evaluation for central vein stenosis-has right upper extremity AV graft.  Hyponatremia -Secondary to volume depletion - Sodium normal today.  Paroxysmal atrial fibrillation -Presently in sinus rhythm -Rate controlled -Continue warfarin. INR therapeutic.  Warfarin-induced coagulopathy -Patient has supratherapeutic INR--8.43 on admission. Decreased. -As the patient is not actively bleeding and hemodynamically stable, allow INR to drift down -vitamin K if bleed or INR continues to rise  Hypertension -Continue carvedilol and Imdur. Dose increased by cardiology  SLE/positive anticardiolipin antibodies/history of DVT/PE:  -On chronic Coumadin at home. INR as above.. Status post renal transplant with chronic allograft nephropathy: Continue immunosuppressants  Anemia of CKD -Baseline hemoglobin 9-10 -transfuse one unit PRBC in setting of drop in Hgb and chest pain. Stable.  Right fibular and medial malleolus fracture -As per cardiology, patient would be high risk for surgery with a recent NSTEMI -continue CAM boot -outpt follow up with Dr. Ninfa Linden   DVT prophylaxis: Anticoagulated on Coumadin. CODE STATUS: Full Family Communication: None at bedside Disposition Plan: DC home when medically stable.    Procedures/Studies: Dg Ankle Complete Right  02/17/2015  CLINICAL DATA:  Right ankle pain and deformity, fall in hallway January 22, 2015 EXAM: RIGHT ANKLE - COMPLETE 3+ VIEW COMPARISON:  01/23/2015 FINDINGS: There is an oblique fracture of the lateral malleolus. There is transverse fracture of the medial malleolus. There is dislocation of the  tibiotalar joint with lateral rotation of the talus. Although no definite posterior malleolar fracture is identified, there is significant artifact along the posterior aspect of the ankle on the lateral view. There is dense atherosclerotic calcification of the small vessels. IMPRESSION: 1. Bimalleolar fracture of the ankle. 2. Tibiotalar dislocation, increased significantly since prior study. Electronically Signed   By: Nolon Nations M.D.   On: 02/17/2015 14:30   Ct Head Wo Contrast  03/14/2015  CLINICAL DATA:  Acute onset of dizziness.  Initial encounter. EXAM: CT HEAD WITHOUT CONTRAST TECHNIQUE: Contiguous axial images were obtained from the base of the skull through the vertex without intravenous contrast. COMPARISON:  CT of the head performed 02/14/2015 FINDINGS: There is no evidence of acute infarction, mass lesion, or intra- or extra-axial hemorrhage on CT. Prominence of the ventricles sulci reflects mild cortical volume loss. The brainstem and fourth ventricle are within normal limits. The basal ganglia are unremarkable in appearance. The cerebral hemispheres demonstrate grossly normal gray-white differentiation. No mass effect or midline shift is seen. There is no evidence of fracture; visualized osseous structures are unremarkable in appearance. The visualized portions of the orbits are within normal limits. The paranasal sinuses and mastoid air cells are well-aerated. No significant soft tissue abnormalities are seen. IMPRESSION: 1. No acute intracranial pathology seen on CT. 2. Mild cortical volume loss noted. Electronically Signed   By: Garald Balding M.D.   On: 03/14/2015 00:35   Ct Humerus Right Wo Contrast  03/18/2015  CLINICAL DATA:  [place] 3-4 days ago.  Injured right upper extremity. EXAM: CT OF THE RIGHT SHOULDER WITHOUT CONTRAST; CT OF THE RIGHT HUMERUS WITHOUT CONTRAST; CT OF THE RIGHT FOREARM WITHOUT CONTRAST TECHNIQUE: Multidetector CT imaging was performed according to the standard  protocol. Multiplanar CT image reconstructions were also generated. COMPARISON:  None. FINDINGS: No acute fractures are  identified. The shoulder joint is maintained. No fracture or dislocation. Mild AC joint degenerative changes. No AC joint separation. No humeral fracture. There is extensive subacromial/subdeltoid and subscapularis bursitis. There is also a large glenohumeral joint effusion and severe synovitis. Dialysis grafts are noted in the right upper extremity. The elbow joint is maintained. No elbow fracture or obvious joint effusion. Headache cardiac No forearm fractures are identified. There is a moderate-sized right pleural effusion with overlying atelectasis. No obvious acute right rib fractures are identified. The thoracic vertebral bodies are intact. Moderate degenerative changes noted at the sternoclavicular joints. Small scarred right kidney is noted. There is advanced atherosclerotic calcifications involving the aorta and branch vessels. IMPRESSION: No acute fractures of the right upper extremity are identified. The joints are maintained. Extensive synovitis, joint effusion and bursitis involving the right shoulder. Electronically Signed   By: Marijo Sanes M.D.   On: 03/18/2015 09:11   Ct Shoulder Right Wo Contrast  03/18/2015  CLINICAL DATA:  [place] 3-4 days ago.  Injured right upper extremity. EXAM: CT OF THE RIGHT SHOULDER WITHOUT CONTRAST; CT OF THE RIGHT HUMERUS WITHOUT CONTRAST; CT OF THE RIGHT FOREARM WITHOUT CONTRAST TECHNIQUE: Multidetector CT imaging was performed according to the standard protocol. Multiplanar CT image reconstructions were also generated. COMPARISON:  None. FINDINGS: No acute fractures are identified. The shoulder joint is maintained. No fracture or dislocation. Mild AC joint degenerative changes. No AC joint separation. No humeral fracture. There is extensive subacromial/subdeltoid and subscapularis bursitis. There is also a large glenohumeral joint effusion and severe  synovitis. Dialysis grafts are noted in the right upper extremity. The elbow joint is maintained. No elbow fracture or obvious joint effusion. Headache cardiac No forearm fractures are identified. There is a moderate-sized right pleural effusion with overlying atelectasis. No obvious acute right rib fractures are identified. The thoracic vertebral bodies are intact. Moderate degenerative changes noted at the sternoclavicular joints. Small scarred right kidney is noted. There is advanced atherosclerotic calcifications involving the aorta and branch vessels. IMPRESSION: No acute fractures of the right upper extremity are identified. The joints are maintained. Extensive synovitis, joint effusion and bursitis involving the right shoulder. Electronically Signed   By: Marijo Sanes M.D.   On: 03/18/2015 09:11   Ct Forearm Right Wo Contrast  03/18/2015  CLINICAL DATA:  [place] 3-4 days ago.  Injured right upper extremity. EXAM: CT OF THE RIGHT SHOULDER WITHOUT CONTRAST; CT OF THE RIGHT HUMERUS WITHOUT CONTRAST; CT OF THE RIGHT FOREARM WITHOUT CONTRAST TECHNIQUE: Multidetector CT imaging was performed according to the standard protocol. Multiplanar CT image reconstructions were also generated. COMPARISON:  None. FINDINGS: No acute fractures are identified. The shoulder joint is maintained. No fracture or dislocation. Mild AC joint degenerative changes. No AC joint separation. No humeral fracture. There is extensive subacromial/subdeltoid and subscapularis bursitis. There is also a large glenohumeral joint effusion and severe synovitis. Dialysis grafts are noted in the right upper extremity. The elbow joint is maintained. No elbow fracture or obvious joint effusion. Headache cardiac No forearm fractures are identified. There is a moderate-sized right pleural effusion with overlying atelectasis. No obvious acute right rib fractures are identified. The thoracic vertebral bodies are intact. Moderate degenerative changes noted  at the sternoclavicular joints. Small scarred right kidney is noted. There is advanced atherosclerotic calcifications involving the aorta and branch vessels. IMPRESSION: No acute fractures of the right upper extremity are identified. The joints are maintained. Extensive synovitis, joint effusion and bursitis involving the right shoulder. Electronically Signed  By: Marijo Sanes M.D.   On: 03/18/2015 09:11   US Renal  03/15/2015  CLINICAL DATA:  Acute renal failure superimposed on stage 4 chronic kidney disease. EXAM: RENAL / URINARY TRACT ULTRASOUND COMPLETE COMPARISON:  Included portions from chest CT 02/18/2015 FINDINGS: Right Kidney: Not visualized. Left Kidney: Not visualized. Transplant kidney located in the pelvis, side not specified. There is no transplant hydronephrosis. Blood flow seen. Transplant kidney measures 10 cm. Transplant cortical thickness appears maintained. No focal lesion. Detailed transplant Doppler evaluation not performed. Bladder: Appears normal for degree of bladder distention. IMPRESSION: 1. Native kidneys not identified, likely secondary renal atrophy. 2. Pelvic transplant kidney without hydronephrosis. Electronically Signed   By: Jeb Levering M.D.   On: 03/15/2015 23:08   Dg Chest Portable 1 View  03/14/2015  CLINICAL DATA:  Fever and weakness tonight. EXAM: PORTABLE CHEST 1 VIEW COMPARISON:  Radiograph 02/18/2015.  Chest CT 02/16/2015 FINDINGS: Cardiomediastinal contours are unchanged allowing for patient rotation. Small left pleural effusion, diminished from prior. No evidence of right pleural effusion. Left basilar scarring. No pulmonary edema. No confluent airspace disease. No pneumothorax. IMPRESSION: Persistent but decreased left pleural effusion. Exam is otherwise unchanged. Electronically Signed   By: Jeb Levering M.D.   On: 03/14/2015 02:09   Dg Chest Port 1 View  02/18/2015  CLINICAL DATA:  Central line placement EXAM: PORTABLE CHEST 1 VIEW COMPARISON:   02/16/2015 FINDINGS: Cardiomediastinal silhouette is stable. No pulmonary edema. Tiny right pleural effusion. There is left IJ central line with tip in SVC. No pneumothorax. No segmental infiltrate. IMPRESSION: Left IJ central line in place.  No pneumothorax. Electronically Signed   By: Lahoma Crocker M.D.   On: 02/18/2015 14:44        Subjective: Right upper extremity pain and swelling improved.  Objective: Filed Vitals:   03/17/15 2025 03/18/15 0541 03/18/15 0744 03/18/15 1603  BP: 128/59 156/65 162/69 157/62  Pulse: 83 81 84 80  Temp: 97.8 F (36.6 C) 98.4 F (36.9 C) 97.8 F (36.6 C) 97.5 F (36.4 C)  TempSrc: Oral Oral Oral Oral  Resp: 19 20 18 17   Height:      Weight: 70.6 kg (155 lb 10.3 oz)     SpO2: 100% 99% 100% 100%    Intake/Output Summary (Last 24 hours) at 03/18/15 1851 Last data filed at 03/18/15 1850  Gross per 24 hour  Intake 1733.34 ml  Output    801 ml  Net 932.34 ml   Weight change: 0.1 kg (3.5 oz) Exam:   General:  Pt is alert, follows commands appropriately, not in acute distress.   HEENT: No icterus, No thrush, No neck mass, [place]/AT  Cardiovascular: RRR, S1/S2, no rubs, no gallops. Telemetry: Sinus rhythm.  Respiratory: CTA bilaterally, no wheezing, no crackles, no rhonchi  Abdomen: Soft/+BS, non tender, non distended, no guarding  Extremities: 1+RLE edema, No lymphangitis, No petechiae, No rashes, no synovitis. Right leg in cam boot. Right upper extremity swelling improved compared to yesterday. No other acute findings.  Data Reviewed: Basic Metabolic Panel:  Recent Labs Lab 03/14/15 0426  03/14/15 1503 03/15/15 0651 03/16/15 0509 03/17/15 0703 03/18/15 0530  NA 130*  < > 131* 131* 132* 136 134*  K 5.1  < > 5.1 4.9 5.2* 4.3 4.7  CL 101  < > 101 103 103 107 103  CO2 21*  < > 19* 19* 20* 22 21*  GLUCOSE 124*  < > 132* 148* 130* 124* 182*  BUN 73*  < >  69* 75* 76* 69* 58*  CREATININE 4.32*  < > 4.03* 4.23* 3.96* 3.41* 2.69*  CALCIUM  9.0  < > 9.2 9.4 9.4 9.1 9.6  MG 2.2  --   --   --   --   --   --   PHOS  --   --   --   --   --   --  3.4  < > = values in this interval not displayed. Liver Function Tests:  Recent Labs Lab 03/18/15 0530  ALBUMIN 2.2*   No results for input(s): LIPASE, AMYLASE in the last 168 hours. No results for input(s): AMMONIA in the last 168 hours. CBC:  Recent Labs Lab 03/13/15 2039 03/14/15 0426 03/15/15 0651 03/16/15 0509 03/17/15 0703  WBC 10.1 8.0 7.9 9.4 6.4  NEUTROABS  --  6.0  --   --   --   HGB 8.2* 7.4* 9.2* 8.9* 9.1*  HCT 25.9* 22.7* 28.0* 27.6* 27.3*  MCV 81.4 81.1 82.8 83.4 85.8  PLT 199 174 174 173 155   Cardiac Enzymes:  Recent Labs Lab 03/14/15 1139 03/14/15 2139 03/15/15 0906 03/17/15 0703 03/17/15 1211 03/17/15 1819  CKTOTAL  --   --  29*  --   --   --   TROPONINI 0.05* 0.05*  --  0.04* 0.04* 0.08*   BNP: Invalid input(s): POCBNP CBG: No results for input(s): GLUCAP in the last 168 hours.  Recent Results (from the past 240 hour(s))  Blood culture (routine x 2)     Status: None   Collection Time: 03/14/15  1:15 AM  Result Value Ref Range Status   Specimen Description BLOOD BLOOD LEFT FOREARM  Final   Special Requests BOTTLES DRAWN AEROBIC AND ANAEROBIC 5CC  Final   Culture  Setup Time   Final    GRAM POSITIVE COCCI IN CLUSTERS IN BOTH AEROBIC AND ANAEROBIC BOTTLES CRITICAL RESULT CALLED TO, READ BACK BY AND VERIFIED WITH: Vivianne Spence RN 2017 03/14/15 A BROWNING    Culture   Final    ENTEROCOCCUS SPECIES SUSCEPTIBILITIES PERFORMED ON PREVIOUS CULTURE WITHIN THE LAST 5 DAYS. STAPHYLOCOCCUS SPECIES (COAGULASE NEGATIVE) THE SIGNIFICANCE OF ISOLATING THIS ORGANISM FROM A SINGLE SET OF BLOOD CULTURES WHEN MULTIPLE SETS ARE DRAWN IS UNCERTAIN. PLEASE NOTIFY THE MICROBIOLOGY DEPARTMENT WITHIN ONE WEEK IF SPECIATION AND SENSITIVITIES ARE REQUIRED.    Report Status 03/17/2015 FINAL  Final  Blood culture (routine x 2)     Status: None   Collection Time:  03/14/15  1:33 AM  Result Value Ref Range Status   Specimen Description BLOOD RIGHT WRIST  Final   Special Requests BOTTLES DRAWN AEROBIC AND ANAEROBIC 5CC   Final   Culture  Setup Time   Final    GRAM POSITIVE COCCI IN CHAINS IN BOTH AEROBIC AND ANAEROBIC BOTTLES CRITICAL RESULT CALLED TO, READ BACK BY AND VERIFIED WITH: K MOORE,RN @0144  03/15/15 MKELLY    Culture ENTEROCOCCUS SPECIES  Final   Report Status 03/17/2015 FINAL  Final   Organism ID, Bacteria ENTEROCOCCUS SPECIES  Final      Susceptibility   Enterococcus species - MIC*    AMPICILLIN <=2 SENSITIVE Sensitive     VANCOMYCIN 1 SENSITIVE Sensitive     GENTAMICIN SYNERGY SENSITIVE Sensitive     * ENTEROCOCCUS SPECIES  Culture, blood (Routine X 2) w Reflex to ID Panel     Status: None (Preliminary result)   Collection Time: 03/16/15  5:00 AM  Result Value Ref Range Status   Specimen  Description BLOOD LEFT HAND  Final   Special Requests AEROBIC BOTTLE ONLY 5ML  Final   Culture  Setup Time   Final    GRAM POSITIVE COCCI IN PAIRS IN CHAINS AEROBIC BOTTLE ONLY CRITICAL RESULT CALLED TO, READ BACK BY AND VERIFIED WITH: A. LOWE,RN AT [id number] ON [id number] BY S. YARBROUGH    Culture NO GROWTH 2 DAYS  Final   Report Status PENDING  Incomplete  Culture, blood (Routine X 2) w Reflex to ID Panel     Status: None (Preliminary result)   Collection Time: 03/16/15  5:04 AM  Result Value Ref Range Status   Specimen Description BLOOD LEFT FINGER  Final   Special Requests IN PEDIATRIC BOTTLE  Final   Culture NO GROWTH 2 DAYS  Final   Report Status PENDING  Incomplete     Scheduled Meds: . allopurinol  200 mg Oral Daily  . amLODipine  5 mg Oral Daily  . ampicillin (OMNIPEN) IV  2 g Intravenous Q12H  . atorvastatin  40 mg Oral q1800  . calcitRIOL  0.25 mcg Oral QODAY  . calcitRIOL  0.5 mcg Oral QODAY  . carvedilol  12.5 mg Oral BID WC  . [START ON 03/19/2015] darbepoetin (ARANESP) injection - NON-DIALYSIS  100 mcg Subcutaneous Q Sat-1800  .  famotidine  20 mg Oral Daily  . feeding supplement (NEPRO CARB STEADY)  237 mL Oral BID BM  . feeding supplement (PRO-STAT SUGAR FREE 64)  30 mL Oral Daily  . isosorbide mononitrate  90 mg Oral Daily  . latanoprost  1 drop Both Eyes QHS  . magnesium oxide  400 mg Oral BID  . mycophenolate  180 mg Oral BID  . predniSONE  5 mg Oral Q breakfast  . ranolazine  500 mg Oral BID  . sodium bicarbonate  1,300 mg Oral BID  . tacrolimus  2 mg Oral BID  . Warfarin - Pharmacist Dosing Inpatient   Does not apply q1800   Continuous Infusions:   Yanis Larin, MD, FACP, FHM. [facility] Pager [phone]  If 7PM-7AM, please contact night-coverage [email or web address] Password [facility] 03/18/2015, 6:51 PM   LOS: 4 days

## 2015-03-18 NOTE — Consult Note (Signed)
   [facility] CM Inpatient Consult   03/18/2015  Debbie [name] [birth year] [id number] Patient is currently active with [facility] for chronic disease management services.  Patient has been engaged by a [employer].  Spoke with the patient and she states that her community RNCM had given her daughter a list of primary physicians and her daughter was following up with that.  Our community based plan of care has focused on disease management and community resource support.  Written consent active.  Patient will receive a post discharge transition of care call and will be evaluated for monthly home visits for assessments and disease process education.  Made Inpatient Case Manager aware that [facility] following. Of note, [facility] Management services does not replace or interfere with any services that are needed or arranged by inpatient case management or social work.  For additional questions or referrals please contact: Natividad Brood, RN BSN [facility] Liaison  [phone] business mobile phone Toll free office [phone]

## 2015-03-18 NOTE — Progress Notes (Signed)
Received call from [facility] on microbiology with blood culture results.Results as follows gram positive cocci in pairs and chains.Reported this information to oncoming nurse Lurena Joiner and text paged Dr.Hongalgi.

## 2015-03-18 NOTE — Progress Notes (Signed)
Subjective: Debbie [name] reported two episodes of a "burning" CP yesterday.  Both lasting about 7 minutes.  Objective: Vital signs in last 24 hours: Temp:  [97.8 F (36.6 C)-98.6 F (37 C)] 97.8 F (36.6 C) (02/24 0744) Pulse Rate:  [81-103] 84 (02/24 0744) Resp:  [18-20] 18 (02/24 0744) BP: (119-163)/(59-80) 162/69 mmHg (02/24 0744) SpO2:  [99 %-100 %] 100 % (02/24 0744) Weight:  [155 lb 10.3 oz (70.6 kg)] 155 lb 10.3 oz (70.6 kg) (02/23 2025) Last BM Date: 03/17/15  Intake/Output from previous day: 02/23 0701 - 02/24 0700 In: 1683.3 [P.O.:720; I.V.:963.3] Out: 551 [Urine:551] Intake/Output this shift: Total I/O In: 240 [P.O.:240] Out: 0   Medications Scheduled Meds: . allopurinol  200 mg Oral Daily  . ampicillin (OMNIPEN) IV  2 g Intravenous Q12H  . atorvastatin  40 mg Oral q1800  . calcitRIOL  0.25 mcg Oral QODAY  . calcitRIOL  0.5 mcg Oral QODAY  . carvedilol  12.5 mg Oral BID WC  . famotidine  20 mg Oral Daily  . feeding supplement (NEPRO CARB STEADY)  237 mL Oral BID BM  . feeding supplement (PRO-STAT SUGAR FREE 64)  30 mL Oral Daily  . isosorbide mononitrate  90 mg Oral Daily  . latanoprost  1 drop Both Eyes QHS  . magnesium oxide  400 mg Oral BID  . mycophenolate  180 mg Oral BID  . predniSONE  5 mg Oral Q breakfast  . sodium bicarbonate  1,300 mg Oral BID  . tacrolimus  2 mg Oral BID  . warfarin  2 mg Oral ONCE-1800  . Warfarin - Pharmacist Dosing Inpatient   Does not apply q1800   Continuous Infusions:  PRN Meds:.acetaminophen **OR** acetaminophen, fentaNYL (SUBLIMAZE) injection, nitroGLYCERIN, ondansetron **OR** ondansetron (ZOFRAN) IV, oxyCODONE-acetaminophen  PE: General appearance: alert, cooperative and no distress Lungs: clear to auscultation bilaterally No chest tenderness Heart: regular rate and rhythm, S1, S2 normal, no murmur, click, rub or gallop Extremities: Right upper ext is swollen.  No LEE Pulses: 1+ radials  Skin: Warm and  dry Neurologic: Grossly normal  Lab Results:   Recent Labs  03/16/15 0509 03/17/15 0703  WBC 9.4 6.4  HGB 8.9* 9.1*  HCT 27.6* 27.3*  PLT 173 155   BMET  Recent Labs  03/16/15 0509 03/17/15 0703 03/18/15 0530  NA 132* 136 134*  K 5.2* 4.3 4.7  CL 103 107 103  CO2 20* 22 21*  GLUCOSE 130* 124* 182*  BUN 76* 69* 58*  CREATININE 3.96* 3.41* 2.69*  CALCIUM 9.4 9.1 9.6   PT/INR  Recent Labs  03/16/15 0509 03/17/15 0703 03/18/15 0530  LABPROT 34.6* 32.8* 28.6*  INR 3.53* 3.29* 2.74*    Assessment/Plan 65 year old female patient with history of renal transplant 2010 HTN, antiphospholipid antibody syndrome with hypercoagulable and 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 Jacques Navy appointment 02/16/15). Debbie [name] was was recently discharged from the hospital on 02/21/2015 after treatment for NSTEMI and acute on chronic renal failure. During that admission Debbie [name] was felt to be dehydrated. With hydration Debbie [name] had respiratory failure and a NSTEMI with a Troponin of 13. Echo 02/15/15 showed an EF of 40-45%. Cath was considered but it was deferred secondary to improvement in her symptoms and her baseline renal insufficiency.  Principal Problem:  Acute renal failure superimposed on stage 4 chronic kidney disease (HCC) Active Problems:   CKD (chronic kidney disease), stage IV (HCC)   PAF (paroxysmal atrial fibrillation) (HCC)   HTN (hypertension)   Chronic anticoagulation - with Coumadin   Dyslipidemia   Pulmonary hypertension ([place])   NSTEMI- Jan 2017- medical Rx   Renal transplant recipient   Coagulopathy ([place])   Cardiomyopathy, ischemic-40-45%   Supratherapeutic INR   LBBB (left bundle branch block)   Gram-positive  bacteremia   Enterococcal bacteremia   Bacteremia   Arm pain, right  Acute renal failure superimposed on stage 4 chronic kidney disease (HCC)  Creatinine is improving 3.41>>2.69   HTN (hypertension)  Blood pressure is still high today.  isosorbide and beta blocker increased yesterday.  Add amlodipine 5mg  today.  May help with angina as well.    NSTEMI- Jan 2017- medical Rx 0.04>>0.04>>0.08 More CP yesterday at 1330hrs.  Two episodes lasting ~80mins.  The last troponin of 0.08 was at 1819hrs last night.  EKGs do not show any acute changes including yesterday afternoon.  HR is well controlled on telemetry.   Her blood pressure is high.  Isosorbide dose and her beta blocker doses were increased yesterday. Debbie [name] had been on Ranexa prior to admission. Resume renexa today.     LBBB (left bundle branch block)   intermittent. It is not present today.   Bacteremia   Bacteremia is being evaluated and treated along with infectious disease team.   right arm pain. No acute fractures.  Extensive synovitis, joint effusion and bursitis involving the right shoulder    LOS: 4 days    HAGER, BRYAN PA-C 03/18/2015 12:15 PM  I have personally seen and examined this patient with Tarri Fuller, PA-C. I agree with the assessment and plan as outlined above. Debbie [name] likely has obstructive CAD but Debbie [name] is not currently a cath candidate with stage 4 CKD. Debbie [name] is also anti-coagulated with therapeutic INR. NO plans at this time for invasive evaluation. Attempting medical management of CAD. Will continue IMdur and beta blocker. Add back Ranexa and will add Norvasc.   Debbie [name] 03/18/2015 12:50 PM

## 2015-03-18 NOTE — Progress Notes (Signed)
Subjective:  Now blood cultures from 2/20 are growing GPC- previous blood cultures enterococcus- creatinine though is down to her baseline - she is feeling swollen- BP is up Objective Vital signs in last 24 hours: Filed Vitals:   03/17/15 1748 03/17/15 2025 03/18/15 0541 03/18/15 0744  BP: 119/61 128/59 156/65 162/69  Pulse: 93 83 81 84  Temp: 98.6 F (37 C) 97.8 F (36.6 C) 98.4 F (36.9 C) 97.8 F (36.6 C)  TempSrc: Oral Oral Oral Oral  Resp: 18 19 20 18   Height:      Weight:  70.6 kg (155 lb 10.3 oz)    SpO2: 100% 100% 99% 100%   Weight change: 0.1 kg (3.5 oz)  Intake/Output Summary (Last 24 hours) at 03/18/15 1306 Last data filed at 03/18/15 1049  Gross per 24 hour  Intake 1683.34 ml  Output    551 ml  Net 1132.34 ml    Assessment/ Plan: Pt is a 65 y.o. yo female with s/p renal transplant, antiphospholipid syndrome, Afib and DVT on coumadin who was admitted on 03/13/2015 with  A on CRF in the setting of hypotension- has resolved but now discovery of enterococcal bacteremia   Assessment/Plan: 1. Renal- s/p transplant with allograft nephropathy- baseline creatinine in the mid 2's- renal function is back to her baseline although is a little volume overloaded as well so may be diluted- no more IVF 2. Immune suppression/enterococcal bacteremia- on prograf (level pending) and myfortic as well as pred 5- now has a bacteremia in her immune compromised state- ID involved changed her over to ampicillin- recommending TTE- pt has not decided yet 3. CAD- not a cardiac cath candidate due to kidney transplant and allograft nephropathy  4. HTN/volume- now overloaded- no more IVF - on norvasc- resumed today and coreg  5. Anemia- iron contraindicated- will give ESA for hgb less than 10 6. Right arm swelling: differential diagnosis include deep venous thrombosis and central vein stenosis-she has a right upper arm arteriovenous graft and appears to have prominent external jugular vein on the right  side as well as some prominent veins on her right upper chest favoring the latter. She is already on anticoagulation with Coumadin so evaluation for DVT would just be an academic exercise to explain swelling. At some point after she has sufficient renal recovery, she may need central venogram with angioplasty if she has stenosis to alleviate some of her swelling.   Nelline Lio A    Labs: Basic Metabolic Panel:  Recent Labs Lab 03/16/15 0509 03/17/15 0703 03/18/15 0530  NA 132* 136 134*  K 5.2* 4.3 4.7  CL 103 107 103  CO2 20* 22 21*  GLUCOSE 130* 124* 182*  BUN 76* 69* 58*  CREATININE 3.96* 3.41* 2.69*  CALCIUM 9.4 9.1 9.6  PHOS  --   --  3.4   Liver Function Tests:  Recent Labs Lab 03/18/15 0530  ALBUMIN 2.2*   No results for input(s): LIPASE, AMYLASE in the last 168 hours. No results for input(s): AMMONIA in the last 168 hours. CBC:  Recent Labs Lab 03/13/15 2039 03/14/15 0426 03/15/15 0651 03/16/15 0509 03/17/15 0703  WBC 10.1 8.0 7.9 9.4 6.4  NEUTROABS  --  6.0  --   --   --   HGB 8.2* 7.4* 9.2* 8.9* 9.1*  HCT 25.9* 22.7* 28.0* 27.6* 27.3*  MCV 81.4 81.1 82.8 83.4 85.8  PLT 199 174 174 173 155   Cardiac Enzymes:  Recent Labs Lab 03/14/15 1139 03/14/15 2139 03/15/15 0906  03/17/15 0703 03/17/15 1211 03/17/15 1819  CKTOTAL  --   --  29*  --   --   --   TROPONINI 0.05* 0.05*  --  0.04* 0.04* 0.08*   CBG: No results for input(s): GLUCAP in the last 168 hours.  Iron Studies: No results for input(s): IRON, TIBC, TRANSFERRIN, FERRITIN in the last 72 hours. Studies/Results: Ct Humerus Right Wo Contrast  03/18/2015  CLINICAL DATA:  [place] 3-4 days ago.  Injured right upper extremity. EXAM: CT OF THE RIGHT SHOULDER WITHOUT CONTRAST; CT OF THE RIGHT HUMERUS WITHOUT CONTRAST; CT OF THE RIGHT FOREARM WITHOUT CONTRAST TECHNIQUE: Multidetector CT imaging was performed according to the standard protocol. Multiplanar CT image reconstructions were also  generated. COMPARISON:  None. FINDINGS: No acute fractures are identified. The shoulder joint is maintained. No fracture or dislocation. Mild AC joint degenerative changes. No AC joint separation. No humeral fracture. There is extensive subacromial/subdeltoid and subscapularis bursitis. There is also a large glenohumeral joint effusion and severe synovitis. Dialysis grafts are noted in the right upper extremity. The elbow joint is maintained. No elbow fracture or obvious joint effusion. Headache cardiac No forearm fractures are identified. There is a moderate-sized right pleural effusion with overlying atelectasis. No obvious acute right rib fractures are identified. The thoracic vertebral bodies are intact. Moderate degenerative changes noted at the sternoclavicular joints. Small scarred right kidney is noted. There is advanced atherosclerotic calcifications involving the aorta and branch vessels. IMPRESSION: No acute fractures of the right upper extremity are identified. The joints are maintained. Extensive synovitis, joint effusion and bursitis involving the right shoulder. Electronically Signed   By: Marijo Sanes M.D.   On: 03/18/2015 09:11   Ct Shoulder Right Wo Contrast  03/18/2015  CLINICAL DATA:  [place] 3-4 days ago.  Injured right upper extremity. EXAM: CT OF THE RIGHT SHOULDER WITHOUT CONTRAST; CT OF THE RIGHT HUMERUS WITHOUT CONTRAST; CT OF THE RIGHT FOREARM WITHOUT CONTRAST TECHNIQUE: Multidetector CT imaging was performed according to the standard protocol. Multiplanar CT image reconstructions were also generated. COMPARISON:  None. FINDINGS: No acute fractures are identified. The shoulder joint is maintained. No fracture or dislocation. Mild AC joint degenerative changes. No AC joint separation. No humeral fracture. There is extensive subacromial/subdeltoid and subscapularis bursitis. There is also a large glenohumeral joint effusion and severe synovitis. Dialysis grafts are noted in the right upper  extremity. The elbow joint is maintained. No elbow fracture or obvious joint effusion. Headache cardiac No forearm fractures are identified. There is a moderate-sized right pleural effusion with overlying atelectasis. No obvious acute right rib fractures are identified. The thoracic vertebral bodies are intact. Moderate degenerative changes noted at the sternoclavicular joints. Small scarred right kidney is noted. There is advanced atherosclerotic calcifications involving the aorta and branch vessels. IMPRESSION: No acute fractures of the right upper extremity are identified. The joints are maintained. Extensive synovitis, joint effusion and bursitis involving the right shoulder. Electronically Signed   By: Marijo Sanes M.D.   On: 03/18/2015 09:11   Ct Forearm Right Wo Contrast  03/18/2015  CLINICAL DATA:  [place] 3-4 days ago.  Injured right upper extremity. EXAM: CT OF THE RIGHT SHOULDER WITHOUT CONTRAST; CT OF THE RIGHT HUMERUS WITHOUT CONTRAST; CT OF THE RIGHT FOREARM WITHOUT CONTRAST TECHNIQUE: Multidetector CT imaging was performed according to the standard protocol. Multiplanar CT image reconstructions were also generated. COMPARISON:  None. FINDINGS: No acute fractures are identified. The shoulder joint is maintained. No fracture or dislocation. Mild AC joint degenerative  changes. No AC joint separation. No humeral fracture. There is extensive subacromial/subdeltoid and subscapularis bursitis. There is also a large glenohumeral joint effusion and severe synovitis. Dialysis grafts are noted in the right upper extremity. The elbow joint is maintained. No elbow fracture or obvious joint effusion. Headache cardiac No forearm fractures are identified. There is a moderate-sized right pleural effusion with overlying atelectasis. No obvious acute right rib fractures are identified. The thoracic vertebral bodies are intact. Moderate degenerative changes noted at the sternoclavicular joints. Small scarred right  kidney is noted. There is advanced atherosclerotic calcifications involving the aorta and branch vessels. IMPRESSION: No acute fractures of the right upper extremity are identified. The joints are maintained. Extensive synovitis, joint effusion and bursitis involving the right shoulder. Electronically Signed   By: Marijo Sanes M.D.   On: 03/18/2015 09:11   Medications: Infusions:    Scheduled Medications: . allopurinol  200 mg Oral Daily  . amLODipine  5 mg Oral Daily  . ampicillin (OMNIPEN) IV  2 g Intravenous Q12H  . atorvastatin  40 mg Oral q1800  . calcitRIOL  0.25 mcg Oral QODAY  . calcitRIOL  0.5 mcg Oral QODAY  . carvedilol  12.5 mg Oral BID WC  . famotidine  20 mg Oral Daily  . feeding supplement (NEPRO CARB STEADY)  237 mL Oral BID BM  . feeding supplement (PRO-STAT SUGAR FREE 64)  30 mL Oral Daily  . isosorbide mononitrate  90 mg Oral Daily  . latanoprost  1 drop Both Eyes QHS  . magnesium oxide  400 mg Oral BID  . mycophenolate  180 mg Oral BID  . predniSONE  5 mg Oral Q breakfast  . ranolazine  500 mg Oral BID  . sodium bicarbonate  1,300 mg Oral BID  . tacrolimus  2 mg Oral BID  . warfarin  2 mg Oral ONCE-1800  . Warfarin - Pharmacist Dosing Inpatient   Does not apply q1800    have reviewed scheduled and prn medications.  Physical Exam: General: NAD Heart: RRR Lungs: mostly clear Abdomen: obese, soft, non tender Extremities: pitting edema Dialysis Access: patent right AVF with edema as well     03/18/2015,1:06 PM  LOS: 4 days

## 2015-03-18 NOTE — Progress Notes (Signed)
Pharmacy Antibiotic Note  Debbie [name] is a 65 y.o. female admitted on 03/13/2015 with bacteremia.  Pharmacy has been consulted for ampicillin dosing.  Plan: -Ampicillin 2g IV q12h- adjusted for renal function -follow for addition of high dose ceftriaxone based on Dr. Lucianne Lei Dam's note from yesterday -follow for s/s of allergic reaction- asked RN Lurena Joiner to alert me if patient complains of anything or if he notices   Height: 5\' 1"  (154.9 cm) Weight: 155 lb 10.3 oz (70.6 kg) IBW/kg (Calculated) : 47.8  Temp (24hrs), Avg:98.2 F (36.8 C), Min:97.8 F (36.6 C), Max:98.6 F (37 C)   Recent Labs Lab 03/13/15 2039 03/14/15 0426  03/14/15 1503 03/15/15 0651 03/16/15 0509 03/17/15 0703 03/18/15 0530  WBC 10.1 8.0  --   --  7.9 9.4 6.4  --   CREATININE 4.31* 4.32*  < > 4.03* 4.23* 3.96* 3.41* 2.69*  < > = values in this interval not displayed.  Estimated Creatinine Clearance: 18.7 mL/min (by C-G formula based on Cr of 2.69).    Allergies  Allergen Reactions  . Feraheme [Ferumoxytol] Other (See Comments)    Chest pain, pulsating 02/17/15: tolerated Nulecit  . Vancomycin Anaphylaxis, Itching, Swelling and Other (See Comments)    Tongue swell  . Dorzolamide Hcl-Timolol Mal Rash and Other (See Comments)    Eye pain  . Gentamycin [Gentamicin Sulfate] Itching and Swelling  . Latanoprost Rash and Other (See Comments)    Eye pain  . Cefazolin Itching  . Codeine Itching  . Hydrocodone-Acetaminophen Itching  . Penicillins Itching    Has patient had a PCN reaction causing immediate rash, facial/tongue/throat swelling, SOB or lightheadedness with hypotension:No Has patient had a PCN reaction causing severe rash involving mucus membranes or skin necrosis:No Has patient had a PCN reaction that required hospitalization:No Has patient had a PCN reaction occurring within the last 10 years:No If all of the above answers are "NO", then may proceed with Cephalosporin use.     Antimicrobials  this admission: Zyvox 2/20 >> 2/21 (received 1 dose) Dapto 2/21 >> 2/23 Ampicillin 2/24>>  Dose adjustments this admission: N/A  Microbiology results: 2/20 BCx: 2/2 entercococcus, pan sens 2/22 BCx: 1/2 GPC  Thank you for allowing pharmacy to be a part of this patient's care.  Dudley Cooley D. Kimberlie Csaszar, PharmD, BCPS Clinical Pharmacist Pager: [phone] 03/18/2015 10:29 AM

## 2015-03-18 NOTE — Progress Notes (Signed)
Subjective:  No new complaints   Antibiotics:  Anti-infectives    Start     Dose/Rate Route Frequency Ordered Stop   03/18/15 1100  ampicillin (OMNIPEN) 2 g in sodium chloride 0.9 % 50 mL IVPB     2 g 150 mL/hr over 20 Minutes Intravenous Every 12 hours 03/18/15 0820     03/17/15 1100  DAPTOmycin (CUBICIN) 575 mg in sodium chloride 0.9 % IVPB  Status:  Discontinued     575 mg 223 mL/hr over 30 Minutes Intravenous Every 48 hours 03/16/15 1122 03/18/15 0820   03/15/15 1100  DAPTOmycin (CUBICIN) 530 mg in sodium chloride 0.9 % IVPB  Status:  Discontinued     530 mg 221.2 mL/hr over 30 Minutes Intravenous Every 48 hours 03/15/15 0847 03/16/15 1122   03/14/15 2230  linezolid (ZYVOX) IVPB 600 mg  Status:  Discontinued     600 mg 300 mL/hr over 60 Minutes Intravenous Every 12 hours 03/14/15 2220 03/15/15 0830      Medications: Scheduled Meds: . allopurinol  200 mg Oral Daily  . amLODipine  5 mg Oral Daily  . ampicillin (OMNIPEN) IV  2 g Intravenous Q12H  . atorvastatin  40 mg Oral q1800  . calcitRIOL  0.25 mcg Oral QODAY  . calcitRIOL  0.5 mcg Oral QODAY  . carvedilol  12.5 mg Oral BID WC  . [START ON 03/19/2015] darbepoetin (ARANESP) injection - NON-DIALYSIS  100 mcg Subcutaneous Q Sat-1800  . famotidine  20 mg Oral Daily  . feeding supplement (NEPRO CARB STEADY)  237 mL Oral BID BM  . feeding supplement (PRO-STAT SUGAR FREE 64)  30 mL Oral Daily  . isosorbide mononitrate  90 mg Oral Daily  . latanoprost  1 drop Both Eyes QHS  . magnesium oxide  400 mg Oral BID  . mycophenolate  180 mg Oral BID  . predniSONE  5 mg Oral Q breakfast  . ranolazine  500 mg Oral BID  . sodium bicarbonate  1,300 mg Oral BID  . tacrolimus  2 mg Oral BID  . warfarin  2 mg Oral ONCE-1800  . Warfarin - Pharmacist Dosing Inpatient   Does not apply q1800   Continuous Infusions:   PRN Meds:.acetaminophen **OR** acetaminophen, fentaNYL (SUBLIMAZE) injection, nitroGLYCERIN, ondansetron **OR**  ondansetron (ZOFRAN) IV, oxyCODONE-acetaminophen    Objective: Weight change: 3.5 oz (0.1 kg)  Intake/Output Summary (Last 24 hours) at 03/18/15 1806 Last data filed at 03/18/15 1413  Gross per 24 hour  Intake 1733.34 ml  Output    551 ml  Net 1182.34 ml   Blood pressure 157/62, pulse 80, temperature 97.5 F (36.4 C), temperature source Oral, resp. rate 17, height 5\' 1"  (1.549 m), weight 155 lb 10.3 oz (70.6 kg), SpO2 100 %. Temp:  [97.5 F (36.4 C)-98.4 F (36.9 C)] 97.5 F (36.4 C) (02/24 1603) Pulse Rate:  [80-84] 80 (02/24 1603) Resp:  [17-20] 17 (02/24 1603) BP: (128-162)/(59-69) 157/62 mmHg (02/24 1603) SpO2:  [99 %-100 %] 100 % (02/24 1603) Weight:  [155 lb 10.3 oz (70.6 kg)] 155 lb 10.3 oz (70.6 kg) (02/23 2025)  Physical Exam: General: Alert and awake, oriented x3,  HEENT: anicteric sclera,  Cardiovascular: irr, irr rate, normal r, no murmur rubs or gallops Pulmonary: clear to auscultation bilaterally, no wheezing, rales or rhonchi Gastrointestinal: soft nontender, nondistended, normal bowel sounds, Musculoskeletal:right ankle in cast, right shoulder  is exquisitely tender to palpation aSkin, soft tissue: no rashes Neuro: nonfocal, strength  and sensation intact  CBC:  CBC Latest Ref Rng 03/17/2015 03/16/2015 03/15/2015  WBC 4.0 - 10.5 K/uL 6.4 9.4 7.9  Hemoglobin 12.0 - 15.0 g/dL 9.1(L) 8.9(L) 9.2(L)  Hematocrit 36.0 - 46.0 % 27.3(L) 27.6(L) 28.0(L)  Platelets 150 - 400 K/uL 155 173 174      BMET  Recent Labs  03/17/15 0703 03/18/15 0530  NA 136 134*  K 4.3 4.7  CL 107 103  CO2 22 21*  GLUCOSE 124* 182*  BUN 69* 58*  CREATININE 3.41* 2.69*  CALCIUM 9.1 9.6     Liver Panel   Recent Labs  03/18/15 0530  ALBUMIN 2.2*       Sedimentation Rate No results for input(s): ESRSEDRATE in the last 72 hours. C-Reactive Protein No results for input(s): CRP in the last 72 hours.  Micro Results: Recent Results (from the past 720 hour(s))    Blood culture (routine x 2)     Status: None   Collection Time: 03/14/15  1:15 AM  Result Value Ref Range Status   Specimen Description BLOOD BLOOD LEFT FOREARM  Final   Special Requests BOTTLES DRAWN AEROBIC AND ANAEROBIC 5CC  Final   Culture  Setup Time   Final    GRAM POSITIVE COCCI IN CLUSTERS IN BOTH AEROBIC AND ANAEROBIC BOTTLES CRITICAL RESULT CALLED TO, READ BACK BY AND VERIFIED WITH: Vivianne Spence RN 2017 03/14/15 A BROWNING    Culture   Final    ENTEROCOCCUS SPECIES SUSCEPTIBILITIES PERFORMED ON PREVIOUS CULTURE WITHIN THE LAST 5 DAYS. STAPHYLOCOCCUS SPECIES (COAGULASE NEGATIVE) THE SIGNIFICANCE OF ISOLATING THIS ORGANISM FROM A SINGLE SET OF BLOOD CULTURES WHEN MULTIPLE SETS ARE DRAWN IS UNCERTAIN. PLEASE NOTIFY THE MICROBIOLOGY DEPARTMENT WITHIN ONE WEEK IF SPECIATION AND SENSITIVITIES ARE REQUIRED.    Report Status 03/17/2015 FINAL  Final  Blood culture (routine x 2)     Status: None   Collection Time: 03/14/15  1:33 AM  Result Value Ref Range Status   Specimen Description BLOOD RIGHT WRIST  Final   Special Requests BOTTLES DRAWN AEROBIC AND ANAEROBIC 5CC   Final   Culture  Setup Time   Final    GRAM POSITIVE COCCI IN CHAINS IN BOTH AEROBIC AND ANAEROBIC BOTTLES CRITICAL RESULT CALLED TO, READ BACK BY AND VERIFIED WITH: K MOORE,RN @0144  03/15/15 MKELLY    Culture ENTEROCOCCUS SPECIES  Final   Report Status 03/17/2015 FINAL  Final   Organism ID, Bacteria ENTEROCOCCUS SPECIES  Final      Susceptibility   Enterococcus species - MIC*    AMPICILLIN <=2 SENSITIVE Sensitive     VANCOMYCIN 1 SENSITIVE Sensitive     GENTAMICIN SYNERGY SENSITIVE Sensitive     * ENTEROCOCCUS SPECIES  Culture, blood (Routine X 2) w Reflex to ID Panel     Status: None (Preliminary result)   Collection Time: 03/16/15  5:00 AM  Result Value Ref Range Status   Specimen Description BLOOD LEFT HAND  Final   Special Requests AEROBIC BOTTLE ONLY 5ML  Final   Culture  Setup Time   Final    GRAM POSITIVE  COCCI IN PAIRS IN CHAINS AEROBIC BOTTLE ONLY CRITICAL RESULT CALLED TO, READ BACK BY AND VERIFIED WITH: A. LOWE,RN AT [id number] ON [id number] BY S. YARBROUGH    Culture NO GROWTH 2 DAYS  Final   Report Status PENDING  Incomplete  Culture, blood (Routine X 2) w Reflex to ID Panel     Status: None (Preliminary result)   Collection Time: 03/16/15  5:04  AM  Result Value Ref Range Status   Specimen Description BLOOD LEFT FINGER  Final   Special Requests IN PEDIATRIC BOTTLE  Final   Culture NO GROWTH 2 DAYS  Final   Report Status PENDING  Incomplete    Studies/Results: Ct Humerus Right Wo Contrast  03/18/2015  CLINICAL DATA:  [place] 3-4 days ago.  Injured right upper extremity. EXAM: CT OF THE RIGHT SHOULDER WITHOUT CONTRAST; CT OF THE RIGHT HUMERUS WITHOUT CONTRAST; CT OF THE RIGHT FOREARM WITHOUT CONTRAST TECHNIQUE: Multidetector CT imaging was performed according to the standard protocol. Multiplanar CT image reconstructions were also generated. COMPARISON:  None. FINDINGS: No acute fractures are identified. The shoulder joint is maintained. No fracture or dislocation. Mild AC joint degenerative changes. No AC joint separation. No humeral fracture. There is extensive subacromial/subdeltoid and subscapularis bursitis. There is also a large glenohumeral joint effusion and severe synovitis. Dialysis grafts are noted in the right upper extremity. The elbow joint is maintained. No elbow fracture or obvious joint effusion. Headache cardiac No forearm fractures are identified. There is a moderate-sized right pleural effusion with overlying atelectasis. No obvious acute right rib fractures are identified. The thoracic vertebral bodies are intact. Moderate degenerative changes noted at the sternoclavicular joints. Small scarred right kidney is noted. There is advanced atherosclerotic calcifications involving the aorta and branch vessels. IMPRESSION: No acute fractures of the right upper extremity are identified. The  joints are maintained. Extensive synovitis, joint effusion and bursitis involving the right shoulder. Electronically Signed   By: Marijo Sanes M.D.   On: 03/18/2015 09:11   Ct Shoulder Right Wo Contrast  03/18/2015  CLINICAL DATA:  [place] 3-4 days ago.  Injured right upper extremity. EXAM: CT OF THE RIGHT SHOULDER WITHOUT CONTRAST; CT OF THE RIGHT HUMERUS WITHOUT CONTRAST; CT OF THE RIGHT FOREARM WITHOUT CONTRAST TECHNIQUE: Multidetector CT imaging was performed according to the standard protocol. Multiplanar CT image reconstructions were also generated. COMPARISON:  None. FINDINGS: No acute fractures are identified. The shoulder joint is maintained. No fracture or dislocation. Mild AC joint degenerative changes. No AC joint separation. No humeral fracture. There is extensive subacromial/subdeltoid and subscapularis bursitis. There is also a large glenohumeral joint effusion and severe synovitis. Dialysis grafts are noted in the right upper extremity. The elbow joint is maintained. No elbow fracture or obvious joint effusion. Headache cardiac No forearm fractures are identified. There is a moderate-sized right pleural effusion with overlying atelectasis. No obvious acute right rib fractures are identified. The thoracic vertebral bodies are intact. Moderate degenerative changes noted at the sternoclavicular joints. Small scarred right kidney is noted. There is advanced atherosclerotic calcifications involving the aorta and branch vessels. IMPRESSION: No acute fractures of the right upper extremity are identified. The joints are maintained. Extensive synovitis, joint effusion and bursitis involving the right shoulder. Electronically Signed   By: Marijo Sanes M.D.   On: 03/18/2015 09:11   Ct Forearm Right Wo Contrast  03/18/2015  CLINICAL DATA:  [place] 3-4 days ago.  Injured right upper extremity. EXAM: CT OF THE RIGHT SHOULDER WITHOUT CONTRAST; CT OF THE RIGHT HUMERUS WITHOUT CONTRAST; CT OF THE RIGHT FOREARM  WITHOUT CONTRAST TECHNIQUE: Multidetector CT imaging was performed according to the standard protocol. Multiplanar CT image reconstructions were also generated. COMPARISON:  None. FINDINGS: No acute fractures are identified. The shoulder joint is maintained. No fracture or dislocation. Mild AC joint degenerative changes. No AC joint separation. No humeral fracture. There is extensive subacromial/subdeltoid and subscapularis bursitis. There is also a  large glenohumeral joint effusion and severe synovitis. Dialysis grafts are noted in the right upper extremity. The elbow joint is maintained. No elbow fracture or obvious joint effusion. Headache cardiac No forearm fractures are identified. There is a moderate-sized right pleural effusion with overlying atelectasis. No obvious acute right rib fractures are identified. The thoracic vertebral bodies are intact. Moderate degenerative changes noted at the sternoclavicular joints. Small scarred right kidney is noted. There is advanced atherosclerotic calcifications involving the aorta and branch vessels. IMPRESSION: No acute fractures of the right upper extremity are identified. The joints are maintained. Extensive synovitis, joint effusion and bursitis involving the right shoulder. Electronically Signed   By: Marijo Sanes M.D.   On: 03/18/2015 09:11      Assessment/Plan:  INTERVAL HISTORY:  03/17/15: now BOTH cultures are growing Enterococcus 03/18/15: repeat cultures also + 1/2 , and CT showing large effusion right shoulder   Principal Problem:   Acute renal failure superimposed on stage 4 chronic kidney disease ([place]) Active Problems:   CKD (chronic kidney disease), stage IV (HCC)   PAF (paroxysmal atrial fibrillation) (HCC)   HTN (hypertension)   Chronic anticoagulation - with Coumadin   Dyslipidemia   Pulmonary hypertension ([place])   NSTEMI- Jan 2017- medical Rx   Renal transplant recipient   Coagulopathy ([place])   Cardiomyopathy, ischemic-40-45%    Supratherapeutic INR   LBBB (left bundle branch block)   Gram-positive bacteremia   Enterococcal bacteremia   Bacteremia   Arm pain, right    Debbie [name] is a 65 y.o. female with  65 y.o. female with renal transplant on immunosuppressive drugs with recent admission for NSTEMI now admitted with dizziness, weakness and fevers and found to have Enterococcus  2/2 blood cultures, And Coag Neg Staph in 1/2 blood cultures she is now with severe right shoulder, forearm and arm pain  #1 Enterococcal bacteremia:   I  Feel much MORE STRONGLY that she needs a TEE and told her this as well as her sister who was with her in the room.  I will continue high dose AMP and if she continues to tolerate this add Ceftriaxone 2 g IV q 12 hours  #2 Right shoulder, arm and forearm pain: CT shows large effusion concerning for septic shoulder esp given onset 2 weeks ago and bacteremia  --I WOULD CONSULT ORTHOPEDICS  #2 Coag Neg staph: This is a contaminant. Fortunately the enterococcus grew anyway on this blood culture otherwise this contaminant could've obfuscated her true bacteremia  Dr. Megan Salon is covering this weekend.  I spent greater than 35 minutes with the patient including greater than 50% of time in face to face counsel of the patient guarding her enterococcal bacteremia her likely septic right shoulder her kidney transplant and in coordination of her care.      LOS: 4 days   Alcide Evener 03/18/2015, 6:06 PM

## 2015-03-19 DIAGNOSIS — R7881 Bacteremia: Secondary | ICD-10-CM

## 2015-03-19 LAB — RENAL FUNCTION PANEL
ALBUMIN: 1.9 g/dL — AB (ref 3.5–5.0)
Anion gap: 10 (ref 5–15)
BUN: 59 mg/dL — AB (ref 6–20)
CALCIUM: 9.4 mg/dL (ref 8.9–10.3)
CO2: 22 mmol/L (ref 22–32)
Chloride: 106 mmol/L (ref 101–111)
Creatinine, Ser: 2.34 mg/dL — ABNORMAL HIGH (ref 0.44–1.00)
GFR calc Af Amer: 24 mL/min — ABNORMAL LOW (ref >60)
GFR, EST NON AFRICAN AMERICAN: 21 mL/min — AB (ref >60)
Glucose, Bld: 151 mg/dL — ABNORMAL HIGH (ref 65–99)
PHOSPHORUS: 2.5 mg/dL (ref 2.5–4.6)
POTASSIUM: 4.8 mmol/L (ref 3.5–5.1)
SODIUM: 138 mmol/L (ref 135–145)

## 2015-03-19 LAB — CULTURE, BLOOD (ROUTINE X 2)

## 2015-03-19 LAB — CBC
HEMATOCRIT: 27 % — AB (ref 36.0–46.0)
Hemoglobin: 8.7 g/dL — ABNORMAL LOW (ref 12.0–15.0)
MCH: 27.4 pg (ref 26.0–34.0)
MCHC: 32.2 g/dL (ref 30.0–36.0)
MCV: 84.9 fL (ref 78.0–100.0)
PLATELETS: 151 10*3/uL (ref 150–400)
RBC: 3.18 MIL/uL — ABNORMAL LOW (ref 3.87–5.11)
RDW: 17 % — AB (ref 11.5–15.5)
WBC: 7.8 10*3/uL (ref 4.0–10.5)

## 2015-03-19 LAB — PROTIME-INR
INR: >10 (ref 0.00–1.49)
INR: 2.83 — AB (ref 0.00–1.49)
PROTHROMBIN TIME: 29.3 s — AB (ref 11.6–15.2)

## 2015-03-19 MED ORDER — DARBEPOETIN ALFA 100 MCG/0.5ML IJ SOSY
PREFILLED_SYRINGE | INTRAMUSCULAR | Status: AC
  Administered 2015-03-19: 100 ug via SUBCUTANEOUS
  Filled 2015-03-19: qty 0.5

## 2015-03-19 MED ORDER — WARFARIN SODIUM 2.5 MG PO TABS
1.2500 mg | ORAL_TABLET | Freq: Once | ORAL | Status: AC
  Administered 2015-03-19: 1.25 mg via ORAL
  Filled 2015-03-19: qty 0.5

## 2015-03-19 MED ORDER — DEXTROSE 5 % IV SOLN
2.0000 g | Freq: Two times a day (BID) | INTRAVENOUS | Status: DC
  Administered 2015-03-19 – 2015-03-23 (×9): 2 g via INTRAVENOUS
  Filled 2015-03-19 (×10): qty 2

## 2015-03-19 MED ORDER — CARVEDILOL 25 MG PO TABS
25.0000 mg | ORAL_TABLET | Freq: Two times a day (BID) | ORAL | Status: DC
  Administered 2015-03-19 – 2015-03-28 (×19): 25 mg via ORAL
  Filled 2015-03-19 (×19): qty 1

## 2015-03-19 NOTE — Progress Notes (Signed)
Subjective: Feeling well without complaint.  Has had no chest pain, SOB, PND or orthopnea.  No chest pain since adjustment in cardiac medications.  Objective: Vital signs in last 24 hours: Temp:  [97.5 F (36.4 C)-98.8 F (37.1 C)] 98.8 F (37.1 C) (02/25 0450) Pulse Rate:  [80-81] 80 (02/25 0450) Resp:  [17-20] 20 (02/25 0450) BP: (144-157)/(59-63) 154/59 mmHg (02/25 0450) SpO2:  [96 %-100 %] 98 % (02/25 0450) Last BM Date: 03/17/15  Intake/Output from previous day: 02/24 0701 - 02/25 0700 In: 1060 [P.O.:960; IV Piggyback:100] Out: 625 [Urine:625] Intake/Output this shift:    Medications Scheduled Meds: . allopurinol  200 mg Oral Daily  . amLODipine  5 mg Oral Daily  . ampicillin (OMNIPEN) IV  2 g Intravenous Q12H  . atorvastatin  40 mg Oral q1800  . calcitRIOL  0.25 mcg Oral QODAY  . calcitRIOL  0.5 mcg Oral QODAY  . carvedilol  12.5 mg Oral BID WC  . darbepoetin (ARANESP) injection - NON-DIALYSIS  100 mcg Subcutaneous Q Sat-1800  . famotidine  20 mg Oral Daily  . feeding supplement (NEPRO CARB STEADY)  237 mL Oral BID BM  . feeding supplement (PRO-STAT SUGAR FREE 64)  30 mL Oral Daily  . isosorbide mononitrate  90 mg Oral Daily  . latanoprost  1 drop Both Eyes QHS  . magnesium oxide  400 mg Oral BID  . mycophenolate  180 mg Oral BID  . predniSONE  5 mg Oral Q breakfast  . ranolazine  500 mg Oral BID  . sodium bicarbonate  1,300 mg Oral BID  . tacrolimus  2 mg Oral BID  . Warfarin - Pharmacist Dosing Inpatient   Does not apply q1800   Continuous Infusions:  PRN Meds:.acetaminophen **OR** acetaminophen, fentaNYL (SUBLIMAZE) injection, nitroGLYCERIN, ondansetron **OR** ondansetron (ZOFRAN) IV, oxyCODONE-acetaminophen  PE: General appearance: alert, cooperative and no distress Lungs: clear to auscultation bilaterally No chest tenderness Heart: regular rate and rhythm, S1, S2 normal, no murmur, click, rub or gallop Extremities: Right upper ext is swollen.  No  LEE Pulses: 1+ radials  Skin: Warm and dry Neurologic: Grossly normal  Lab Results:   Recent Labs  03/17/15 0703 03/19/15 0553  WBC 6.4 7.8  HGB 9.1* 8.7*  HCT 27.3* 27.0*  PLT 155 151   BMET  Recent Labs  03/17/15 0703 03/18/15 0530 03/19/15 0553  NA 136 134* 138  K 4.3 4.7 4.8  CL 107 103 106  CO2 22 21* 22  GLUCOSE 124* 182* 151*  BUN 69* 58* 59*  CREATININE 3.41* 2.69* 2.34*  CALCIUM 9.1 9.6 9.4   PT/INR  Recent Labs  03/18/15 0530 03/19/15 0553 03/19/15 0753  LABPROT 28.6* >90.0* 29.3*  INR 2.74* >10.00* 2.83*    Assessment/Plan 65 year old female patient with history of renal transplant 2010 HTN, antiphospholipid antibody syndrome with hypercoagulable and 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 Jacques Navy appointment 02/16/15). She was was recently discharged from the hospital on 02/21/2015 after treatment for NSTEMI and acute on chronic renal failure. During that admission she was felt to be dehydrated. With hydration she had respiratory failure and a NSTEMI with a Troponin of 13. Echo 02/15/15 showed an EF of 40-45%. Cath was considered but it was deferred secondary to improvement in her symptoms and  her baseline renal insufficiency.  Principal Problem:   Acute renal failure superimposed on stage 4 chronic kidney disease (HCC) Active Problems:   CKD (chronic kidney disease), stage IV (HCC)   HTN (hypertension)   Chronic anticoagulation - with Coumadin   Dyslipidemia   Pulmonary hypertension ([place])   NSTEMI- Jan 2017- medical Rx   Renal transplant recipient   Coagulopathy ([place])   Cardiomyopathy, ischemic-40-45%   Supratherapeutic INR   LBBB (left bundle branch block)   Gram-positive  bacteremia   Enterococcal bacteremia   Bacteremia   Arm pain, right   Pyogenic arthritis of right shoulder region [facility])  Acute renal failure superimposed on stage 4 chronic kidney disease (HCC)  Creatinine is improving 3.41>>2.69   HTN (hypertension)  Blood pressure is still high today.  isosorbide and beta blocker increased yesterday.  Add amlodipine 5mg  today.  May help with angina as well. Celine Dishman titrate up coreg to hopefully better control BP.   NSTEMI- Has had no chest pain since medications have been titrated.  BP remains elevated.  Mariel Gaudin plan on increasing coreg to see if this Yarielis Funaro help her hypertension which may also improve her chest pain long term.   LBBB (left bundle branch block)   intermittent. It is not present today.   Bacteremia   Bacteremia is being evaluated and treated along with infectious disease team.  ID recommends TEE.  Yudit Modesitt try to schedule for early next week.    LOS: 5 days    Prophet Renwick Meredith Leeds MD 03/19/2015 8:49 AM

## 2015-03-19 NOTE — Progress Notes (Signed)
Patient ID: Debbie [name], female   DOB: [birth year], 65 y.o.   MRN: [id number]         [facility] for Infectious Disease     Date of Admission:  03/13/2015   Total days of antibiotics 6        Day 2 ampicillin  She has enterococcal bacteremia. All blood cultures from 03/14/2015 and 03/16/2015 are positive. Repeat blood cultures are negative at 24 hours. There is no evidence of endocarditis by TTE but a TEE will be performed early next week. Her infection may be complicated by a septic right shoulder. She is tolerating ampicillin without any rash or itching. I will continue it and add ceftriaxone. We will follow-up on Monday, 03/21/2015.         Michel Bickers, MD [facility] [place] [facility] [phone] pager   [phone] cell 01/25/2015, 1:32 PM

## 2015-03-19 NOTE — Progress Notes (Signed)
ANTICOAGULATION CONSULT NOTE  Pharmacy Consult for Warfarin  Indication: atrial fibrillation  Patient Measurements: Height: 5\' 1"  (154.9 cm) Weight: 155 lb 10.3 oz (70.6 kg) IBW/kg (Calculated) : 47.8  Vital Signs: Temp: 98.8 F (37.1 C) (02/25 0450) Temp Source: Oral (02/25 0450) BP: 154/59 mmHg (02/25 0450) Pulse Rate: 80 (02/25 0450)  Labs:  Recent Labs  03/17/15 0703 03/17/15 1211 03/17/15 1819 03/18/15 0530 03/19/15 0553 03/19/15 0753  HGB 9.1*  --   --   --  8.7*  --   HCT 27.3*  --   --   --  27.0*  --   PLT 155  --   --   --  151  --   LABPROT 32.8*  --   --  28.6* >90.0* 29.3*  INR 3.29*  --   --  2.74* >10.00* 2.83*  CREATININE 3.41*  --   --  2.69* 2.34*  --   TROPONINI 0.04* 0.04* 0.08*  --   --   --     Estimated Creatinine Clearance: 21.5 mL/min (by C-G formula based on Cr of 2.34).   Assessment: 27 YOF s/p kidney transplant here with dizziness. She was recenetly discharged after episode of AKI and NSTEMI which was treated conservatively. She is on warfarin PTA for hypercoagulable state and hx DVT. INR was ~8 on 2/17 in the anti-coag clinic and instructions were to hold doses, admit INR was also high at 8.43.  Vitamin K 2.5mg  po x1 given 2/20, she was also transfused on 2/21.  INR down to 2.83 at goal. Hgb 8.7, Plts 151- stable, no bleeding noted.   Goal of Therapy:  INR 2-3 Monitor platelets by anticoagulation protocol: Yes   Plan:  -warfarin 1.25mg  po x1 tonight -Daily PT/INR -Monitor for bleeding   Melburn Popper, PharmD Clinical Pharmacy Resident Pager: [phone] 03/19/2015 8:55 AM

## 2015-03-19 NOTE — Progress Notes (Signed)
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 Jacques Navy appointment 02/16/15) presented to [facility] with 1 day history of dizziness and generalized weakness. The patient was recently discharged from the hospital on 02/21/2015 after treatment for NSTEMI and acute on chronic renal failure. The patient states that she has had poor by mouth intake for the past 2-3 days with constipation. Upon presentation, she was noted to have a serum creatinine of 4.31. The patient received 2 L bolus of fluid in the emergency department. She was also found to have INR of 8.14. On the afternoon of 03/14/2015, the patient developed substernal chest pain for which she received nitroglycerin with relief. Cardiology was consulted. EKG showed a new LBBB.  Assessment/Plan: Acute on chronic renal failure (CKD 4) in pt with renal transplant -due to volume depletion and infection -restarted judicious IVF -baseline creatinine 2.2-2.3 -Patient endorses compliance with all her medications including immunosuppressive therapy -renal ultrasound: Native kidneys not identified possibly due to atrophy. Transplanted kidney without hydronephrosis. - Creatinine was 2.2 on 1/30. Admitted with creatinine of 4.31 and is gradually improving (3.41 on 2/23). - Requested nephrology consultation given complex renal issues and  patient wishes to speak with them prior to deciding on TEE. - Creatinine down to 2.3 which is her baseline. IV fluids discontinued. Nephrology follow-up appreciated.  Mild hyperkalemia - Resolved after a dose of Kayexalate.  Enterococcus bacteremia -source unclear. One of 2 blood cultures positive for enterococcus. 2nd blood culture positive for enterococcus and Coag neg staphylococcus. -pt has anaphylaxis to vancomycin - Infectious disease follow-up appreciated. Surveillance blood cultures ordered. ID recommends TEE - she wants [place] to discuss with her nephrologist -consulted. Patient is not keen on TEE but is contemplating same. - Cubicin changed to IV ampicillin. Surveillance blood culture neg to date. Patient still very reluctant to undergo TEE and wants to speak with her family. IV Rocephin added by ID on 2/25 - CT right upper extremity shows right shoulder effusion-requested orthopedic consultation for 2/26.  Chest pain and new LBBB on EKG -Concerning for angina with new left bundle branch block -relieved after SL NTG x 1 -EKG--new LBBB -pt had NSTEMI last admission in Jan 2017 -02/15/2015 echo EF 40-45%, PAP 60 -continue ASA -continue imdur/Ranexa -defer Ranexa pharmacy concern to cardiology -elevated troponins likely due to renal failure - Cardiology follow-up appreciated: Suspect anemia contribute it to her angina. Continue carvedilol, Lipitor, Imdur and Ranexa. Cardiology does not recommend any further workup and had signed off 2/22. However due to recurrent NTG responsive chest pain, cardiology has seen again on 2/23. EKG without acute changes and no LBBB at this time (LBBB is intermittent).  - Cardiology has increased dose of Imdur and beta blocker. Ranexa held secondary to renal insufficiency-consider resuming as renal function improves.  Right upper extremity pain and swelling - Reported on 2/23. Right upper extremity distal to elbow does appear diffusely swollen but not warm  and is also tender without other features  of cellulitis or arthritis. Good radial pulsation felt.? Secondary to IV fluids versus rule out DVT versus gout flare. - Elevated right upper extremity. Requested charge nurse to cut restricting band on right wrist. Right upper extremity venous Doppler negative for DVT. Did receive a dose of prednisone 40 mg on 2/23. Right upper extremity swelling and pain improved. As per nephrology, she may need further evaluation for central vein stenosis-has right upper extremity AV graft. - CT shows right shoulder effusion. Requested orthopedic/Dr. Sharol Given to consult on 2/26 for potential infection.  Hyponatremia -Secondary to volume depletion - Sodium normal today.  Paroxysmal atrial fibrillation -Presently in sinus rhythm -Rate controlled -Continue warfarin. INR therapeutic.  Warfarin-induced coagulopathy -Patient has supratherapeutic INR--8.43 on admission. Decreased. -As the patient is not actively bleeding and hemodynamically stable, allow INR to drift down -vitamin K if bleed or INR continues to rise  Hypertension -Continue carvedilol and Imdur. Dose increased by cardiology  SLE/positive anticardiolipin antibodies/history of DVT/PE:  -On chronic Coumadin at home. INR as above.. Status post renal transplant with chronic allograft nephropathy: Continue immunosuppressants  Anemia of CKD -Baseline hemoglobin 9-10 -transfuse one unit PRBC in setting of drop in Hgb and chest pain. Stable.  Right fibular and medial malleolus fracture -As per cardiology, patient would be high risk for surgery with a recent NSTEMI -continue CAM boot -outpt follow up with Dr. Ninfa Linden   DVT prophylaxis: Anticoagulated on Coumadin. CODE STATUS: Full Family Communication: None at bedside Disposition Plan: DC home when medically stable.    Procedures/Studies: Ct Head Wo Contrast  03/14/2015  CLINICAL DATA:  Acute onset of dizziness.  Initial encounter. EXAM: CT HEAD  WITHOUT CONTRAST TECHNIQUE: Contiguous axial images were obtained from the base of the skull through the vertex without intravenous contrast. COMPARISON:  CT of the head performed 02/14/2015 FINDINGS: There is no evidence of acute infarction, mass lesion, or intra- or extra-axial hemorrhage on CT. Prominence of the ventricles sulci reflects mild cortical volume loss. The brainstem and fourth ventricle are within normal limits. The basal ganglia are unremarkable in appearance. The cerebral hemispheres demonstrate grossly normal gray-white differentiation. No mass effect or midline shift is seen. There is no evidence of fracture; visualized osseous structures are unremarkable in appearance. The visualized portions of the orbits are within normal limits. The paranasal sinuses and mastoid air cells are well-aerated. No significant soft tissue abnormalities are seen. IMPRESSION: 1. No acute intracranial pathology seen on CT. 2. Mild cortical volume loss noted. Electronically Signed   By: Garald Balding M.D.   On: 03/14/2015 00:35   Ct Humerus Right Wo Contrast  03/18/2015  CLINICAL DATA:  [place] 3-4 days ago.  Injured right upper extremity. EXAM: CT OF THE RIGHT SHOULDER WITHOUT CONTRAST; CT OF THE RIGHT HUMERUS WITHOUT CONTRAST; CT OF THE RIGHT FOREARM WITHOUT CONTRAST TECHNIQUE: Multidetector CT imaging was performed according to the standard protocol. Multiplanar CT image reconstructions were also generated. COMPARISON:  None. FINDINGS: No acute fractures are identified. The shoulder joint is maintained. No fracture or dislocation. Mild AC joint degenerative changes. No AC joint separation. No humeral fracture. There is extensive subacromial/subdeltoid and subscapularis bursitis. There is also a large glenohumeral joint effusion and severe synovitis. Dialysis grafts are noted in the right upper extremity. The elbow joint is maintained. No elbow fracture or obvious joint effusion. Headache cardiac No forearm fractures  are identified. There is a moderate-sized right pleural effusion with overlying atelectasis. No obvious acute right rib fractures are identified. The thoracic vertebral bodies are  intact. Moderate degenerative changes noted at the sternoclavicular joints. Small scarred right kidney is noted. There is advanced atherosclerotic calcifications involving the aorta and branch vessels. IMPRESSION: No acute fractures of the right upper extremity are identified. The joints are maintained. Extensive synovitis, joint effusion and bursitis involving the right shoulder. Electronically Signed   By: Marijo Sanes M.D.   On: 03/18/2015 09:11   Ct Shoulder Right Wo Contrast  03/18/2015  CLINICAL DATA:  [place] 3-4 days ago.  Injured right upper extremity. EXAM: CT OF THE RIGHT SHOULDER WITHOUT CONTRAST; CT OF THE RIGHT HUMERUS WITHOUT CONTRAST; CT OF THE RIGHT FOREARM WITHOUT CONTRAST TECHNIQUE: Multidetector CT imaging was performed according to the standard protocol. Multiplanar CT image reconstructions were also generated. COMPARISON:  None. FINDINGS: No acute fractures are identified. The shoulder joint is maintained. No fracture or dislocation. Mild AC joint degenerative changes. No AC joint separation. No humeral fracture. There is extensive subacromial/subdeltoid and subscapularis bursitis. There is also a large glenohumeral joint effusion and severe synovitis. Dialysis grafts are noted in the right upper extremity. The elbow joint is maintained. No elbow fracture or obvious joint effusion. Headache cardiac No forearm fractures are identified. There is a moderate-sized right pleural effusion with overlying atelectasis. No obvious acute right rib fractures are identified. The thoracic vertebral bodies are intact. Moderate degenerative changes noted at the sternoclavicular joints. Small scarred right kidney is noted. There is advanced atherosclerotic calcifications involving the aorta and branch vessels. IMPRESSION: No acute  fractures of the right upper extremity are identified. The joints are maintained. Extensive synovitis, joint effusion and bursitis involving the right shoulder. Electronically Signed   By: Marijo Sanes M.D.   On: 03/18/2015 09:11   Ct Forearm Right Wo Contrast  03/18/2015  CLINICAL DATA:  [place] 3-4 days ago.  Injured right upper extremity. EXAM: CT OF THE RIGHT SHOULDER WITHOUT CONTRAST; CT OF THE RIGHT HUMERUS WITHOUT CONTRAST; CT OF THE RIGHT FOREARM WITHOUT CONTRAST TECHNIQUE: Multidetector CT imaging was performed according to the standard protocol. Multiplanar CT image reconstructions were also generated. COMPARISON:  None. FINDINGS: No acute fractures are identified. The shoulder joint is maintained. No fracture or dislocation. Mild AC joint degenerative changes. No AC joint separation. No humeral fracture. There is extensive subacromial/subdeltoid and subscapularis bursitis. There is also a large glenohumeral joint effusion and severe synovitis. Dialysis grafts are noted in the right upper extremity. The elbow joint is maintained. No elbow fracture or obvious joint effusion. Headache cardiac No forearm fractures are identified. There is a moderate-sized right pleural effusion with overlying atelectasis. No obvious acute right rib fractures are identified. The thoracic vertebral bodies are intact. Moderate degenerative changes noted at the sternoclavicular joints. Small scarred right kidney is noted. There is advanced atherosclerotic calcifications involving the aorta and branch vessels. IMPRESSION: No acute fractures of the right upper extremity are identified. The joints are maintained. Extensive synovitis, joint effusion and bursitis involving the right shoulder. Electronically Signed   By: Marijo Sanes M.D.   On: 03/18/2015 09:11   US Renal  03/15/2015  CLINICAL DATA:  Acute renal failure superimposed on stage 4 chronic kidney disease. EXAM: RENAL / URINARY TRACT ULTRASOUND COMPLETE COMPARISON:   Included portions from chest CT 02/18/2015 FINDINGS: Right Kidney: Not visualized. Left Kidney: Not visualized. Transplant kidney located in the pelvis, side not specified. There is no transplant hydronephrosis. Blood flow seen. Transplant kidney measures 10 cm. Transplant cortical thickness appears maintained. No focal lesion. Detailed transplant Doppler evaluation not performed. Bladder:  Appears normal for degree of bladder distention. IMPRESSION: 1. Native kidneys not identified, likely secondary renal atrophy. 2. Pelvic transplant kidney without hydronephrosis. Electronically Signed   By: Jeb Levering M.D.   On: 03/15/2015 23:08   Dg Chest Portable 1 View  03/14/2015  CLINICAL DATA:  Fever and weakness tonight. EXAM: PORTABLE CHEST 1 VIEW COMPARISON:  Radiograph 02/18/2015.  Chest CT 02/16/2015 FINDINGS: Cardiomediastinal contours are unchanged allowing for patient rotation. Small left pleural effusion, diminished from prior. No evidence of right pleural effusion. Left basilar scarring. No pulmonary edema. No confluent airspace disease. No pneumothorax. IMPRESSION: Persistent but decreased left pleural effusion. Exam is otherwise unchanged. Electronically Signed   By: Jeb Levering M.D.   On: 03/14/2015 02:09   Dg Chest Port 1 View  02/18/2015  CLINICAL DATA:  Central line placement EXAM: PORTABLE CHEST 1 VIEW COMPARISON:  02/16/2015 FINDINGS: Cardiomediastinal silhouette is stable. No pulmonary edema. Tiny right pleural effusion. There is left IJ central line with tip in SVC. No pneumothorax. No segmental infiltrate. IMPRESSION: Left IJ central line in place.  No pneumothorax. Electronically Signed   By: Lahoma Crocker M.D.   On: 02/18/2015 14:44        Subjective: Right upper extremity pain and swelling improved. No new complaints reported. No chest pain. Still reluctant for TEE.   Objective: Filed Vitals:   03/18/15 1603 03/18/15 2202 03/19/15 0450 03/19/15 0948  BP: 157/62 144/63 154/59  154/58  Pulse: 80 81 80 81  Temp: 97.5 F (36.4 C) 98.6 F (37 C) 98.8 F (37.1 C) 98.6 F (37 C)  TempSrc: Oral Oral Oral Oral  Resp: 17 18 20 18   Height:      Weight:      SpO2: 100% 96% 98% 98%    Intake/Output Summary (Last 24 hours) at 03/19/15 1844 Last data filed at 03/19/15 1400  Gross per 24 hour  Intake    700 ml  Output    625 ml  Net     75 ml   Weight change:  Exam:   General:  Pt is alert, follows commands appropriately, not in acute distress.   HEENT: No icterus, No thrush, No neck mass, [place]/AT  Cardiovascular: RRR, S1/S2, no rubs, no gallops. Telemetry: Sinus rhythm.  Respiratory: CTA bilaterally, no wheezing, no crackles, no rhonchi  Abdomen: Soft/+BS, non tender, non distended, no guarding  Extremities: 1+RLE edema, No lymphangitis, No petechiae, No rashes, no synovitis. Right leg in cam boot. Right upper extremity swelling improved some. No other acute findings.  Data Reviewed: Basic Metabolic Panel:  Recent Labs Lab 03/14/15 0426  03/15/15 [id number] 03/16/15 0509 03/17/15 0703 03/18/15 0530 03/19/15 0553  NA 130*  < > 131* 132* 136 134* 138  K 5.1  < > 4.9 5.2* 4.3 4.7 4.8  CL 101  < > 103 103 107 103 106  CO2 21*  < > 19* 20* 22 21* 22  GLUCOSE 124*  < > 148* 130* 124* 182* 151*  BUN 73*  < > 75* 76* 69* 58* 59*  CREATININE 4.32*  < > 4.23* 3.96* 3.41* 2.69* 2.34*  CALCIUM 9.0  < > 9.4 9.4 9.1 9.6 9.4  MG 2.2  --   --   --   --   --   --   PHOS  --   --   --   --   --  3.4 2.5  < > = values in this interval not displayed. Liver Function Tests:  Recent Labs Lab 03/18/15 0530 03/19/15 0553  ALBUMIN 2.2* 1.9*   No results for input(s): LIPASE, AMYLASE in the last 168 hours. No results for input(s): AMMONIA in the last 168 hours. CBC:  Recent Labs Lab 03/14/15 0426 03/15/15 0651 03/16/15 0509 03/17/15 0703 03/19/15 0553  WBC 8.0 7.9 9.4 6.4 7.8  NEUTROABS 6.0  --   --   --   --   HGB 7.4* 9.2* 8.9* 9.1* 8.7*  HCT 22.7* 28.0*  27.6* 27.3* 27.0*  MCV 81.1 82.8 83.4 85.8 84.9  PLT 174 174 173 155 151   Cardiac Enzymes:  Recent Labs Lab 03/14/15 1139 03/14/15 2139 03/15/15 0906 03/17/15 0703 03/17/15 1211 03/17/15 1819  CKTOTAL  --   --  29*  --   --   --   TROPONINI 0.05* 0.05*  --  0.04* 0.04* 0.08*   BNP: Invalid input(s): POCBNP CBG: No results for input(s): GLUCAP in the last 168 hours.  Recent Results (from the past 240 hour(s))  Blood culture (routine x 2)     Status: None   Collection Time: 03/14/15  1:15 AM  Result Value Ref Range Status   Specimen Description BLOOD BLOOD LEFT FOREARM  Final   Special Requests BOTTLES DRAWN AEROBIC AND ANAEROBIC 5CC  Final   Culture  Setup Time   Final    GRAM POSITIVE COCCI IN CLUSTERS IN BOTH AEROBIC AND ANAEROBIC BOTTLES CRITICAL RESULT CALLED TO, READ BACK BY AND VERIFIED WITH: Vivianne Spence RN 2017 03/14/15 A BROWNING    Culture   Final    ENTEROCOCCUS SPECIES SUSCEPTIBILITIES PERFORMED ON PREVIOUS CULTURE WITHIN THE LAST 5 DAYS. STAPHYLOCOCCUS SPECIES (COAGULASE NEGATIVE) THE SIGNIFICANCE OF ISOLATING THIS ORGANISM FROM A SINGLE SET OF BLOOD CULTURES WHEN MULTIPLE SETS ARE DRAWN IS UNCERTAIN. PLEASE NOTIFY THE MICROBIOLOGY DEPARTMENT WITHIN ONE WEEK IF SPECIATION AND SENSITIVITIES ARE REQUIRED.    Report Status 03/17/2015 FINAL  Final  Blood culture (routine x 2)     Status: None   Collection Time: 03/14/15  1:33 AM  Result Value Ref Range Status   Specimen Description BLOOD RIGHT WRIST  Final   Special Requests BOTTLES DRAWN AEROBIC AND ANAEROBIC 5CC   Final   Culture  Setup Time   Final    GRAM POSITIVE COCCI IN CHAINS IN BOTH AEROBIC AND ANAEROBIC BOTTLES CRITICAL RESULT CALLED TO, READ BACK BY AND VERIFIED WITH: K MOORE,RN @0144  03/15/15 MKELLY    Culture ENTEROCOCCUS SPECIES  Final   Report Status 03/17/2015 FINAL  Final   Organism ID, Bacteria ENTEROCOCCUS SPECIES  Final      Susceptibility   Enterococcus species - MIC*    AMPICILLIN <=2  SENSITIVE Sensitive     VANCOMYCIN 1 SENSITIVE Sensitive     GENTAMICIN SYNERGY SENSITIVE Sensitive     * ENTEROCOCCUS SPECIES  Culture, blood (Routine X 2) w Reflex to ID Panel     Status: None   Collection Time: 03/16/15  5:00 AM  Result Value Ref Range Status   Specimen Description BLOOD LEFT HAND  Final   Special Requests AEROBIC BOTTLE ONLY 5ML  Final   Culture  Setup Time   Final    GRAM POSITIVE COCCI IN PAIRS IN CHAINS AEROBIC BOTTLE ONLY CRITICAL RESULT CALLED TO, READ BACK BY AND VERIFIED WITH: A. LOWE,RN AT [id number] ON [id number] BY Rhea Bleacher    Culture   Final    ENTEROCOCCUS SPECIES SUSCEPTIBILITIES PERFORMED ON PREVIOUS CULTURE WITHIN THE LAST 5 DAYS.  Report Status 03/19/2015 FINAL  Final  Culture, blood (Routine X 2) w Reflex to ID Panel     Status: None (Preliminary result)   Collection Time: 03/16/15  5:04 AM  Result Value Ref Range Status   Specimen Description BLOOD LEFT FINGER  Final   Special Requests IN PEDIATRIC BOTTLE  Final   Culture  Setup Time   Final    GRAM POSITIVE COCCI IN CLUSTERS IN PAIRS AEROBIC BOTTLE ONLY CRITICAL RESULT CALLED TO, READ BACK BY AND VERIFIED WITHBarnabas Harries RN 2321 03/18/15 A BROWNING    Culture CULTURE REINCUBATED FOR BETTER GROWTH  Final   Report Status PENDING  Incomplete  Culture, blood (Routine X 2) w Reflex to ID Panel     Status: None (Preliminary result)   Collection Time: 03/18/15  8:54 AM  Result Value Ref Range Status   Specimen Description BLOOD BLOOD LEFT HAND  Final   Special Requests IN PEDIATRIC BOTTLE 2.5CC  Final   Culture NO GROWTH 1 DAY  Final   Report Status PENDING  Incomplete  Culture, blood (Routine X 2) w Reflex to ID Panel     Status: None (Preliminary result)   Collection Time: 03/18/15  9:01 AM  Result Value Ref Range Status   Specimen Description BLOOD BLOOD LEFT HAND  Final   Special Requests IN PEDIATRIC BOTTLE 1CC  Final   Culture NO GROWTH 1 DAY  Final   Report Status PENDING  Incomplete       Scheduled Meds: . allopurinol  200 mg Oral Daily  . amLODipine  5 mg Oral Daily  . ampicillin (OMNIPEN) IV  2 g Intravenous Q12H  . atorvastatin  40 mg Oral q1800  . calcitRIOL  0.25 mcg Oral QODAY  . calcitRIOL  0.5 mcg Oral QODAY  . carvedilol  25 mg Oral BID WC  . cefTRIAXone (ROCEPHIN)  IV  2 g Intravenous Q12H  . darbepoetin (ARANESP) injection - NON-DIALYSIS  100 mcg Subcutaneous Q Sat-1800  . famotidine  20 mg Oral Daily  . feeding supplement (NEPRO CARB STEADY)  237 mL Oral BID BM  . feeding supplement (PRO-STAT SUGAR FREE 64)  30 mL Oral Daily  . isosorbide mononitrate  90 mg Oral Daily  . latanoprost  1 drop Both Eyes QHS  . magnesium oxide  400 mg Oral BID  . mycophenolate  180 mg Oral BID  . predniSONE  5 mg Oral Q breakfast  . ranolazine  500 mg Oral BID  . sodium bicarbonate  1,300 mg Oral BID  . tacrolimus  2 mg Oral BID  . warfarin  1.25 mg Oral ONCE-1800  . Warfarin - Pharmacist Dosing Inpatient   Does not apply q1800   Continuous Infusions:   Adrianne Shackleton, MD, FACP, FHM. [facility] Pager [phone]  If 7PM-7AM, please contact night-coverage [email or web address] Password [facility] 03/19/2015, 6:44 PM   LOS: 5 days

## 2015-03-19 NOTE — Progress Notes (Signed)
Subjective:  Now blood cultures from 2/20 and 2/22 are growing GPC- previous blood cultures enterococcus- creatinine though is down to below her baseline -  Objective Vital signs in last 24 hours: Filed Vitals:   03/18/15 1603 03/18/15 2202 03/19/15 0450 03/19/15 0948  BP: 157/62 144/63 154/59 154/58  Pulse: 80 81 80 81  Temp: 97.5 F (36.4 C) 98.6 F (37 C) 98.8 F (37.1 C) 98.6 F (37 C)  TempSrc: Oral Oral Oral Oral  Resp: 17 18 20 18   Height:      Weight:      SpO2: 100% 96% 98% 98%   Weight change:   Intake/Output Summary (Last 24 hours) at 03/19/15 1025 Last data filed at 03/19/15 0932  Gross per 24 hour  Intake   1300 ml  Output    625 ml  Net    675 ml    Assessment/ Plan: Pt is a 65 y.o. yo female with s/p renal transplant, antiphospholipid syndrome, Afib and DVT on coumadin who was admitted on 03/13/2015 with  A on CRF in the setting of hypotension- has resolved but now discovery of enterococcal bacteremia   Assessment/Plan: 1. Renal- s/p transplant with allograft nephropathy- baseline creatinine in the mid 2's- renal function is back to below her baseline although is a little volume overloaded as well so may be diluted- no more IVF 2. Immune suppression/enterococcal bacteremia- on prograf (level pending) and myfortic as well as pred 5- now has a bacteremia in her immune compromised state- ID involved changed her over to ampicillin- recommending TTE- pt has not decided yet 3. CAD- not a cardiac cath candidate due to kidney transplant and allograft nephropathy  4. HTN/volume- now overloaded- no more IVF - on norvasc- resumed today and coreg  5. Anemia- iron contraindicated- will give ESA for hgb less than 10 6. Right arm swelling: differential diagnosis include deep venous thrombosis and central vein stenosis-she has a right upper arm arteriovenous graft and appears to have prominent external jugular vein on the right side as well as some prominent veins on her right upper  chest favoring the latter. She is already on anticoagulation with Coumadin so evaluation for DVT would just be an academic exercise to explain swelling. At some point after she has sufficient renal recovery, she may need central venogram with angioplasty if she has stenosis to alleviate some of her swelling.   Tejal Monroy A    Labs: Basic Metabolic Panel:  Recent Labs Lab 03/17/15 0703 03/18/15 0530 03/19/15 0553  NA 136 134* 138  K 4.3 4.7 4.8  CL 107 103 106  CO2 22 21* 22  GLUCOSE 124* 182* 151*  BUN 69* 58* 59*  CREATININE 3.41* 2.69* 2.34*  CALCIUM 9.1 9.6 9.4  PHOS  --  3.4 2.5   Liver Function Tests:  Recent Labs Lab 03/18/15 0530 03/19/15 0553  ALBUMIN 2.2* 1.9*   No results for input(s): LIPASE, AMYLASE in the last 168 hours. No results for input(s): AMMONIA in the last 168 hours. CBC:  Recent Labs Lab 03/14/15 0426 03/15/15 0651 03/16/15 0509 03/17/15 0703 03/19/15 0553  WBC 8.0 7.9 9.4 6.4 7.8  NEUTROABS 6.0  --   --   --   --   HGB 7.4* 9.2* 8.9* 9.1* 8.7*  HCT 22.7* 28.0* 27.6* 27.3* 27.0*  MCV 81.1 82.8 83.4 85.8 84.9  PLT 174 174 173 155 151   Cardiac Enzymes:  Recent Labs Lab 03/14/15 1139 03/14/15 2139 03/15/15 0906 03/17/15 0703 03/17/15 1211  03/17/15 1819  CKTOTAL  --   --  29*  --   --   --   TROPONINI 0.05* 0.05*  --  0.04* 0.04* 0.08*   CBG: No results for input(s): GLUCAP in the last 168 hours.  Iron Studies: No results for input(s): IRON, TIBC, TRANSFERRIN, FERRITIN in the last 72 hours. Studies/Results: Ct Humerus Right Wo Contrast  03/18/2015  CLINICAL DATA:  [place] 3-4 days ago.  Injured right upper extremity. EXAM: CT OF THE RIGHT SHOULDER WITHOUT CONTRAST; CT OF THE RIGHT HUMERUS WITHOUT CONTRAST; CT OF THE RIGHT FOREARM WITHOUT CONTRAST TECHNIQUE: Multidetector CT imaging was performed according to the standard protocol. Multiplanar CT image reconstructions were also generated. COMPARISON:  None. FINDINGS: No  acute fractures are identified. The shoulder joint is maintained. No fracture or dislocation. Mild AC joint degenerative changes. No AC joint separation. No humeral fracture. There is extensive subacromial/subdeltoid and subscapularis bursitis. There is also a large glenohumeral joint effusion and severe synovitis. Dialysis grafts are noted in the right upper extremity. The elbow joint is maintained. No elbow fracture or obvious joint effusion. Headache cardiac No forearm fractures are identified. There is a moderate-sized right pleural effusion with overlying atelectasis. No obvious acute right rib fractures are identified. The thoracic vertebral bodies are intact. Moderate degenerative changes noted at the sternoclavicular joints. Small scarred right kidney is noted. There is advanced atherosclerotic calcifications involving the aorta and branch vessels. IMPRESSION: No acute fractures of the right upper extremity are identified. The joints are maintained. Extensive synovitis, joint effusion and bursitis involving the right shoulder. Electronically Signed   By: Marijo Sanes M.D.   On: 03/18/2015 09:11   Ct Shoulder Right Wo Contrast  03/18/2015  CLINICAL DATA:  [place] 3-4 days ago.  Injured right upper extremity. EXAM: CT OF THE RIGHT SHOULDER WITHOUT CONTRAST; CT OF THE RIGHT HUMERUS WITHOUT CONTRAST; CT OF THE RIGHT FOREARM WITHOUT CONTRAST TECHNIQUE: Multidetector CT imaging was performed according to the standard protocol. Multiplanar CT image reconstructions were also generated. COMPARISON:  None. FINDINGS: No acute fractures are identified. The shoulder joint is maintained. No fracture or dislocation. Mild AC joint degenerative changes. No AC joint separation. No humeral fracture. There is extensive subacromial/subdeltoid and subscapularis bursitis. There is also a large glenohumeral joint effusion and severe synovitis. Dialysis grafts are noted in the right upper extremity. The elbow joint is maintained.  No elbow fracture or obvious joint effusion. Headache cardiac No forearm fractures are identified. There is a moderate-sized right pleural effusion with overlying atelectasis. No obvious acute right rib fractures are identified. The thoracic vertebral bodies are intact. Moderate degenerative changes noted at the sternoclavicular joints. Small scarred right kidney is noted. There is advanced atherosclerotic calcifications involving the aorta and branch vessels. IMPRESSION: No acute fractures of the right upper extremity are identified. The joints are maintained. Extensive synovitis, joint effusion and bursitis involving the right shoulder. Electronically Signed   By: Marijo Sanes M.D.   On: 03/18/2015 09:11   Ct Forearm Right Wo Contrast  03/18/2015  CLINICAL DATA:  [place] 3-4 days ago.  Injured right upper extremity. EXAM: CT OF THE RIGHT SHOULDER WITHOUT CONTRAST; CT OF THE RIGHT HUMERUS WITHOUT CONTRAST; CT OF THE RIGHT FOREARM WITHOUT CONTRAST TECHNIQUE: Multidetector CT imaging was performed according to the standard protocol. Multiplanar CT image reconstructions were also generated. COMPARISON:  None. FINDINGS: No acute fractures are identified. The shoulder joint is maintained. No fracture or dislocation. Mild AC joint degenerative changes. No AC joint  separation. No humeral fracture. There is extensive subacromial/subdeltoid and subscapularis bursitis. There is also a large glenohumeral joint effusion and severe synovitis. Dialysis grafts are noted in the right upper extremity. The elbow joint is maintained. No elbow fracture or obvious joint effusion. Headache cardiac No forearm fractures are identified. There is a moderate-sized right pleural effusion with overlying atelectasis. No obvious acute right rib fractures are identified. The thoracic vertebral bodies are intact. Moderate degenerative changes noted at the sternoclavicular joints. Small scarred right kidney is noted. There is advanced  atherosclerotic calcifications involving the aorta and branch vessels. IMPRESSION: No acute fractures of the right upper extremity are identified. The joints are maintained. Extensive synovitis, joint effusion and bursitis involving the right shoulder. Electronically Signed   By: Marijo Sanes M.D.   On: 03/18/2015 09:11   Medications: Infusions:    Scheduled Medications: . allopurinol  200 mg Oral Daily  . amLODipine  5 mg Oral Daily  . ampicillin (OMNIPEN) IV  2 g Intravenous Q12H  . atorvastatin  40 mg Oral q1800  . calcitRIOL  0.25 mcg Oral QODAY  . calcitRIOL  0.5 mcg Oral QODAY  . carvedilol  25 mg Oral BID WC  . darbepoetin (ARANESP) injection - NON-DIALYSIS  100 mcg Subcutaneous Q Sat-1800  . famotidine  20 mg Oral Daily  . feeding supplement (NEPRO CARB STEADY)  237 mL Oral BID BM  . feeding supplement (PRO-STAT SUGAR FREE 64)  30 mL Oral Daily  . isosorbide mononitrate  90 mg Oral Daily  . latanoprost  1 drop Both Eyes QHS  . magnesium oxide  400 mg Oral BID  . mycophenolate  180 mg Oral BID  . predniSONE  5 mg Oral Q breakfast  . ranolazine  500 mg Oral BID  . sodium bicarbonate  1,300 mg Oral BID  . tacrolimus  2 mg Oral BID  . warfarin  1.25 mg Oral ONCE-1800  . Warfarin - Pharmacist Dosing Inpatient   Does not apply q1800    have reviewed scheduled and prn medications.  Physical Exam: General: NAD- maybe confused- oatmeal all in her gown Heart: RRR Lungs: mostly clear Abdomen: obese, soft, non tender Extremities: pitting edema Dialysis Access: patent right AVF with edema as well     03/19/2015,10:25 AM  LOS: 5 days

## 2015-03-20 LAB — CBC
HEMATOCRIT: 28.9 % — AB (ref 36.0–46.0)
HEMOGLOBIN: 9.7 g/dL — AB (ref 12.0–15.0)
MCH: 28.5 pg (ref 26.0–34.0)
MCHC: 33.6 g/dL (ref 30.0–36.0)
MCV: 85 fL (ref 78.0–100.0)
Platelets: 137 10*3/uL — ABNORMAL LOW (ref 150–400)
RBC: 3.4 MIL/uL — ABNORMAL LOW (ref 3.87–5.11)
RDW: 17.3 % — AB (ref 11.5–15.5)
WBC: 5.8 10*3/uL (ref 4.0–10.5)

## 2015-03-20 LAB — RENAL FUNCTION PANEL
ANION GAP: 11 (ref 5–15)
Albumin: 2 g/dL — ABNORMAL LOW (ref 3.5–5.0)
BUN: 54 mg/dL — AB (ref 6–20)
CHLORIDE: 105 mmol/L (ref 101–111)
CO2: 23 mmol/L (ref 22–32)
Calcium: 9.8 mg/dL (ref 8.9–10.3)
Creatinine, Ser: 2.28 mg/dL — ABNORMAL HIGH (ref 0.44–1.00)
GFR calc Af Amer: 25 mL/min — ABNORMAL LOW (ref >60)
GFR calc non Af Amer: 21 mL/min — ABNORMAL LOW (ref >60)
Glucose, Bld: 118 mg/dL — ABNORMAL HIGH (ref 65–99)
PHOSPHORUS: 2.3 mg/dL — AB (ref 2.5–4.6)
POTASSIUM: 4.8 mmol/L (ref 3.5–5.1)
Sodium: 139 mmol/L (ref 135–145)

## 2015-03-20 LAB — URIC ACID: Uric Acid, Serum: 3.3 mg/dL (ref 2.3–6.6)

## 2015-03-20 LAB — PROTIME-INR
INR: 3.56 — AB (ref 0.00–1.49)
Prothrombin Time: 34.8 seconds — ABNORMAL HIGH (ref 11.6–15.2)

## 2015-03-20 LAB — TACROLIMUS LEVEL: TACROLIMUS (FK506) - LABCORP: 9.4 ng/mL (ref 2.0–20.0)

## 2015-03-20 MED ORDER — WARFARIN SODIUM 2.5 MG PO TABS: 1.2500 mg | ORAL_TABLET | Freq: Once | ORAL | Status: DC

## 2015-03-20 MED ORDER — FUROSEMIDE 40 MG PO TABS
40.0000 mg | ORAL_TABLET | Freq: Every day | ORAL | Status: DC
  Administered 2015-03-20 – 2015-03-24 (×5): 40 mg via ORAL
  Filled 2015-03-20 (×5): qty 1

## 2015-03-20 MED ORDER — MAGIC MOUTHWASH W/LIDOCAINE
15.0000 mL | Freq: Four times a day (QID) | ORAL | Status: DC | PRN
  Administered 2015-03-21 – 2015-03-23 (×3): 15 mL via ORAL
  Filled 2015-03-20 (×3): qty 15

## 2015-03-20 NOTE — Progress Notes (Signed)
Subjective:  Now blood cultures from 2/20 and 2/22 are growing GPC- previous blood cultures enterococcus- no labs yet this AM- made 700 of urine  Objective Vital signs in last 24 hours: Filed Vitals:   03/19/15 1843 03/19/15 2154 03/20/15 0505 03/20/15 1100  BP: 130/61 147/64 137/59 161/60  Pulse: 81 84 99 77  Temp: 97.8 F (36.6 C) 98.8 F (37.1 C) 98.5 F (36.9 C) 99.1 F (37.3 C)  TempSrc: Oral Oral Oral Oral  Resp: 16 18 18 18   Height:      Weight:      SpO2: 100% 100% 96% 99%   Weight change:   Intake/Output Summary (Last 24 hours) at 03/20/15 1147 Last data filed at 03/20/15 1100  Gross per 24 hour  Intake    170 ml  Output   1000 ml  Net   -830 ml    Assessment/ Plan: Pt is [name] 65 y.o. yo female with s/p renal transplant, antiphospholipid syndrome, Afib and DVT on coumadin who was admitted on 03/13/2015 with  [name] on CRF in the setting of hypotension- has resolved but now discovery of enterococcal bacteremia   Assessment/Plan: 1. Renal- s/p transplant with allograft nephropathy- baseline creatinine in the mid 2's- renal function is back to below her baseline although is [name] little volume overloaded as well so may be diluted- no more IVF 2. Immune suppression/enterococcal bacteremia- on prograf - level was 9.4- about 7 hours after dosing so not true trough- and myfortic as well as pred 5- now has [name] bacteremia in her immune compromised state- ID involved changed her over to ampicillin- and now ceftriaxone as well- want TEE but not sure pt will agree- ortho does not feel she has septic joint-  3. CAD- not [name] cardiac cath candidate due to kidney transplant and allograft nephropathy  4. HTN/volume- now overloaded- no more IVF - on norvasc- resumed today and coreg - will add back home dose of lasix 5. Anemia- iron contraindicated- will give ESA for hgb less than 10 6. Right arm swelling: differential diagnosis include deep venous thrombosis and central vein stenosis-she has [name] right upper  arm arteriovenous graft and appears to have prominent external jugular vein on the right side as well as some prominent veins on her right upper chest favoring the latter. She is already on anticoagulation with Coumadin so evaluation for DVT would just be an academic exercise to explain swelling. At some point after she has sufficient renal recovery, she may need central venogram with angioplasty if she has stenosis to alleviate some of her swelling.   Debbie [name]    Labs: Basic Metabolic Panel:  Recent Labs Lab 03/17/15 0703 03/18/15 0530 03/19/15 0553  NA 136 134* 138  K 4.3 4.7 4.8  CL 107 103 106  CO2 22 21* 22  GLUCOSE 124* 182* 151*  BUN 69* 58* 59*  CREATININE 3.41* 2.69* 2.34*  CALCIUM 9.1 9.6 9.4  PHOS  --  3.4 2.5   Liver Function Tests:  Recent Labs Lab 03/18/15 0530 03/19/15 0553  ALBUMIN 2.2* 1.9*   No results for input(s): LIPASE, AMYLASE in the last 168 hours. No results for input(s): AMMONIA in the last 168 hours. CBC:  Recent Labs Lab 03/14/15 0426 03/15/15 0651 03/16/15 0509 03/17/15 0703 03/19/15 0553 03/20/15 1115  WBC 8.0 7.9 9.4 6.4 7.8 5.8  NEUTROABS 6.0  --   --   --   --   --   HGB 7.4* 9.2* 8.9* 9.1* 8.7* 9.7*  HCT 22.7* 28.0* 27.6* 27.3* 27.0* 28.9*  MCV 81.1 82.8 83.4 85.8 84.9 85.0  PLT 174 174 173 155 151 137*   Cardiac Enzymes:  Recent Labs Lab 03/14/15 1139 03/14/15 2139 03/15/15 0906 03/17/15 0703 03/17/15 1211 03/17/15 1819  CKTOTAL  --   --  29*  --   --   --   TROPONINI 0.05* 0.05*  --  0.04* 0.04* 0.08*   CBG: No results for input(s): GLUCAP in the last 168 hours.  Iron Studies: No results for input(s): IRON, TIBC, TRANSFERRIN, FERRITIN in the last 72 hours. Studies/Results: No results found. Medications: Infusions:    Scheduled Medications: . allopurinol  200 mg Oral Daily  . amLODipine  5 mg Oral Daily  . ampicillin (OMNIPEN) IV  2 g Intravenous Q12H  . atorvastatin  40 mg Oral q1800  .  calcitRIOL  0.25 mcg Oral QODAY  . calcitRIOL  0.5 mcg Oral QODAY  . carvedilol  25 mg Oral BID WC  . cefTRIAXone (ROCEPHIN)  IV  2 g Intravenous Q12H  . darbepoetin (ARANESP) injection - NON-DIALYSIS  100 mcg Subcutaneous Q Sat-1800  . famotidine  20 mg Oral Daily  . feeding supplement (NEPRO CARB STEADY)  237 mL Oral BID BM  . feeding supplement (PRO-STAT SUGAR FREE 64)  30 mL Oral Daily  . isosorbide mononitrate  90 mg Oral Daily  . latanoprost  1 drop Both Eyes QHS  . magnesium oxide  400 mg Oral BID  . mycophenolate  180 mg Oral BID  . predniSONE  5 mg Oral Q breakfast  . ranolazine  500 mg Oral BID  . sodium bicarbonate  1,300 mg Oral BID  . tacrolimus  2 mg Oral BID  . warfarin  1.25 mg Oral ONCE-1800  . Warfarin - Pharmacist Dosing Inpatient   Does not apply q1800    have reviewed scheduled and prn medications.  Physical Exam: General: NAD- maybe confused-  Heart: RRR Lungs: mostly clear Abdomen: obese, soft, non tender Extremities: pitting edema Dialysis Access: patent right AVF with edema as well     03/20/2015,11:47 AM  LOS: 6 days

## 2015-03-20 NOTE — Progress Notes (Signed)
Patient ID: Debbie [name], female   DOB: [birth year], 65 y.o.   MRN: [id number] Patient's uric acid has come back with a normal value of 3.0. Right upper extremity swelling from gout is extremely unlikely with this level of the uric acid. Patient did not have pain with passive range of motion of her shoulder. However when looking at the CT scan there does appear to be a very large fluid collection around the shoulder. I will order radiology to do an aspiration of this fluid collection. Patient's INR is elevated 3.56 however an aspiration should be safe with an elevated INR.

## 2015-03-20 NOTE — Progress Notes (Signed)
PROGRESS NOTE  Debbie [name] [id number] DOB: 1950/10/26 DOA: 03/13/2015 PCP: No PCP Per Patient   Brief History 65 year old female patient with history of renal transplant 2010 on chronic immunosuppression with tacrolimus and mycophenolate, HTN, antiphospholipid antibody syndrome with hypercoagulable state causing venous thrombosis of upper extremities for which she is on chronic Coumadin, PAF, dyslipidemia, CAD (positive Myoview June 2015-conservatively treated due to chronic kidney disease), SLE, CKD 4 with baseline creatinine 2.2-2.3, recent fall and right fibular and medial malleolus fracture-managed by cast and nonweightbearing (seen by Dr. Jacques Navy appointment 02/16/15) presented to [facility] with 1 day history of dizziness and generalized weakness. The patient was recently discharged from the hospital on 02/21/2015 after treatment for NSTEMI and acute on chronic renal failure. The patient states that she has had poor by mouth intake for the past 2-3 days with constipation. Upon presentation, she was noted to have a serum creatinine of 4.31. The patient received 2 L bolus of fluid in the emergency department. She was also found to have INR of 8.14. On the afternoon of 03/14/2015, the patient developed substernal chest pain for which she received nitroglycerin with relief. Cardiology was consulted. EKG showed a new LBBB.  Assessment/Plan: Acute on chronic renal failure (CKD 4) in pt with renal transplant -due to volume depletion and infection -restarted judicious IVF -baseline creatinine 2.2-2.3 -Patient endorses compliance with all her medications including immunosuppressive therapy -renal ultrasound: Native kidneys not identified possibly due to atrophy. Transplanted kidney without hydronephrosis. - Creatinine was 2.2 on 1/30. Admitted with creatinine of 4.31 and is gradually improving (3.41 on 2/23). - Requested nephrology consultation given complex renal issues and  patient wishes to speak with them prior to deciding on TEE. - Creatinine down to 2.3 which is her baseline. IV fluids discontinued. Nephrology follow-up appreciated.  Mild hyperkalemia - Resolved after a dose of Kayexalate.  Enterococcus bacteremia -source unclear. 2/20: 1/2 blood cultures positive for enterococcus. 2nd blood culture positive for enterococcus and Coag neg staphylococcus. 2/22: Blood culture 2: Also positive - pt has anaphylaxis to vancomycin - Infectious disease follow-up appreciated. ID recommends TEE - patient had been contemplating and reluctant all along but has finally consented to having it on 2/26. - Cubicin changed to IV ampicillin & Rocephin. Surveillance blood culture(2/24)  neg to date.  - CT right upper extremity shows right shoulder effusion. -  orthopedics consultation appreciated and do not believe patient has septic right shoulder and recommend colchicine for possible gout.   Chest pain and new LBBB on EKG -Concerning for angina with new left bundle branch block -relieved after SL NTG x 1 -EKG--new LBBB -pt had NSTEMI last admission in Jan 2017 -02/15/2015 echo EF 40-45%, PAP 60 -continue ASA -continue imdur/Ranexa -defer Ranexa pharmacy concern to cardiology -elevated troponins likely due to renal failure - Cardiology follow-up appreciated: Suspect anemia contribute it to her angina. Continue carvedilol, Lipitor, Imdur and Ranexa. Cardiology does not recommend any further workup and had signed off 2/22. However due to recurrent NTG responsive chest pain, cardiology has seen again on 2/23. EKG without acute changes and no LBBB at this time (LBBB is intermittent).  - Cardiology has increased dose of Imdur and beta blocker. Ranexa held secondary to renal insufficiency-consider resuming as renal function improves.  Right upper extremity pain and swelling - Reported on 2/23. Right upper extremity distal to elbow does appear diffusely swollen but not warm and  is also tender without other features  of cellulitis or arthritis. Good radial pulsation felt.? Secondary to IV fluids versus rule out DVT versus gout flare. - Elevated right upper extremity. Requested charge nurse to cut restricting band on right wrist. Right upper extremity venous Doppler negative for DVT. Did receive a dose of prednisone 40 mg on 2/23. Right upper extremity swelling and pain improved. As per nephrology, she may need further evaluation for central vein stenosis-has right upper extremity AV graft. - CT shows right shoulder effusion. Requested orthopedic/Dr. Sharol Given consultation appreciated: Does not believe patient has septic right shoulder and suspects gout and recommends colchicine.  Hyponatremia -Secondary to volume depletion - Sodium normal today.  Paroxysmal atrial fibrillation -Presently in sinus rhythm -Rate controlled -Continue warfarin. INR therapeutic.  Warfarin-induced coagulopathy -Patient has supratherapeutic INR--8.43 on admission. Decreased. -As the patient is not actively bleeding and hemodynamically stable, allow INR to drift down -vitamin K if bleed or INR continues to rise  Hypertension -Continue carvedilol and Imdur. Dose increased by cardiology  SLE/positive anticardiolipin antibodies/history of DVT/PE:  -On chronic Coumadin at home. INR as above.. Status post renal transplant with chronic allograft nephropathy: Continue immunosuppressants  Anemia of CKD -Baseline hemoglobin 9-10 -transfuse one unit PRBC in setting of drop in Hgb and chest pain. Stable.  Right fibular and medial malleolus fracture -As per cardiology, patient would be high risk for surgery with a recent NSTEMI -continue CAM boot -outpt follow up with Dr. Ninfa Linden   DVT prophylaxis: Anticoagulated on Coumadin. CODE STATUS: Full Family Communication: None at bedside Disposition Plan: DC home when medically stable.    Procedures/Studies: Ct Head Wo Contrast  03/14/2015   CLINICAL DATA:  Acute onset of dizziness.  Initial encounter. EXAM: CT HEAD WITHOUT CONTRAST TECHNIQUE: Contiguous axial images were obtained from the base of the skull through the vertex without intravenous contrast. COMPARISON:  CT of the head performed 02/14/2015 FINDINGS: There is no evidence of acute infarction, mass lesion, or intra- or extra-axial hemorrhage on CT. Prominence of the ventricles sulci reflects mild cortical volume loss. The brainstem and fourth ventricle are within normal limits. The basal ganglia are unremarkable in appearance. The cerebral hemispheres demonstrate grossly normal gray-white differentiation. No mass effect or midline shift is seen. There is no evidence of fracture; visualized osseous structures are unremarkable in appearance. The visualized portions of the orbits are within normal limits. The paranasal sinuses and mastoid air cells are well-aerated. No significant soft tissue abnormalities are seen. IMPRESSION: 1. No acute intracranial pathology seen on CT. 2. Mild cortical volume loss noted. Electronically Signed   By: Garald Balding M.D.   On: 03/14/2015 00:35   Ct Humerus Right Wo Contrast  03/18/2015  CLINICAL DATA:  [place] 3-4 days ago.  Injured right upper extremity. EXAM: CT OF THE RIGHT SHOULDER WITHOUT CONTRAST; CT OF THE RIGHT HUMERUS WITHOUT CONTRAST; CT OF THE RIGHT FOREARM WITHOUT CONTRAST TECHNIQUE: Multidetector CT imaging was performed according to the standard protocol. Multiplanar CT image reconstructions were also generated. COMPARISON:  None. FINDINGS: No acute fractures are identified. The shoulder joint is maintained. No fracture or dislocation. Mild AC joint degenerative changes. No AC joint separation. No humeral fracture. There is extensive subacromial/subdeltoid and subscapularis bursitis. There is also a large glenohumeral joint effusion and severe synovitis. Dialysis grafts are noted in the right upper extremity. The elbow joint is maintained. No  elbow fracture or obvious joint effusion. Headache cardiac No forearm fractures are identified. There is a moderate-sized right pleural effusion with overlying atelectasis. No obvious acute right  rib fractures are identified. The thoracic vertebral bodies are intact. Moderate degenerative changes noted at the sternoclavicular joints. Small scarred right kidney is noted. There is advanced atherosclerotic calcifications involving the aorta and branch vessels. IMPRESSION: No acute fractures of the right upper extremity are identified. The joints are maintained. Extensive synovitis, joint effusion and bursitis involving the right shoulder. Electronically Signed   By: Marijo Sanes M.D.   On: 03/18/2015 09:11   Ct Shoulder Right Wo Contrast  03/18/2015  CLINICAL DATA:  [place] 3-4 days ago.  Injured right upper extremity. EXAM: CT OF THE RIGHT SHOULDER WITHOUT CONTRAST; CT OF THE RIGHT HUMERUS WITHOUT CONTRAST; CT OF THE RIGHT FOREARM WITHOUT CONTRAST TECHNIQUE: Multidetector CT imaging was performed according to the standard protocol. Multiplanar CT image reconstructions were also generated. COMPARISON:  None. FINDINGS: No acute fractures are identified. The shoulder joint is maintained. No fracture or dislocation. Mild AC joint degenerative changes. No AC joint separation. No humeral fracture. There is extensive subacromial/subdeltoid and subscapularis bursitis. There is also a large glenohumeral joint effusion and severe synovitis. Dialysis grafts are noted in the right upper extremity. The elbow joint is maintained. No elbow fracture or obvious joint effusion. Headache cardiac No forearm fractures are identified. There is a moderate-sized right pleural effusion with overlying atelectasis. No obvious acute right rib fractures are identified. The thoracic vertebral bodies are intact. Moderate degenerative changes noted at the sternoclavicular joints. Small scarred right kidney is noted. There is advanced  atherosclerotic calcifications involving the aorta and branch vessels. IMPRESSION: No acute fractures of the right upper extremity are identified. The joints are maintained. Extensive synovitis, joint effusion and bursitis involving the right shoulder. Electronically Signed   By: Marijo Sanes M.D.   On: 03/18/2015 09:11   Ct Forearm Right Wo Contrast  03/18/2015  CLINICAL DATA:  [place] 3-4 days ago.  Injured right upper extremity. EXAM: CT OF THE RIGHT SHOULDER WITHOUT CONTRAST; CT OF THE RIGHT HUMERUS WITHOUT CONTRAST; CT OF THE RIGHT FOREARM WITHOUT CONTRAST TECHNIQUE: Multidetector CT imaging was performed according to the standard protocol. Multiplanar CT image reconstructions were also generated. COMPARISON:  None. FINDINGS: No acute fractures are identified. The shoulder joint is maintained. No fracture or dislocation. Mild AC joint degenerative changes. No AC joint separation. No humeral fracture. There is extensive subacromial/subdeltoid and subscapularis bursitis. There is also a large glenohumeral joint effusion and severe synovitis. Dialysis grafts are noted in the right upper extremity. The elbow joint is maintained. No elbow fracture or obvious joint effusion. Headache cardiac No forearm fractures are identified. There is a moderate-sized right pleural effusion with overlying atelectasis. No obvious acute right rib fractures are identified. The thoracic vertebral bodies are intact. Moderate degenerative changes noted at the sternoclavicular joints. Small scarred right kidney is noted. There is advanced atherosclerotic calcifications involving the aorta and branch vessels. IMPRESSION: No acute fractures of the right upper extremity are identified. The joints are maintained. Extensive synovitis, joint effusion and bursitis involving the right shoulder. Electronically Signed   By: Marijo Sanes M.D.   On: 03/18/2015 09:11   US Renal  03/15/2015  CLINICAL DATA:  Acute renal failure superimposed on  stage 4 chronic kidney disease. EXAM: RENAL / URINARY TRACT ULTRASOUND COMPLETE COMPARISON:  Included portions from chest CT 02/18/2015 FINDINGS: Right Kidney: Not visualized. Left Kidney: Not visualized. Transplant kidney located in the pelvis, side not specified. There is no transplant hydronephrosis. Blood flow seen. Transplant kidney measures 10 cm. Transplant cortical thickness appears maintained. No  focal lesion. Detailed transplant Doppler evaluation not performed. Bladder: Appears normal for degree of bladder distention. IMPRESSION: 1. Native kidneys not identified, likely secondary renal atrophy. 2. Pelvic transplant kidney without hydronephrosis. Electronically Signed   By: Jeb Levering M.D.   On: 03/15/2015 23:08   Dg Chest Portable 1 View  03/14/2015  CLINICAL DATA:  Fever and weakness tonight. EXAM: PORTABLE CHEST 1 VIEW COMPARISON:  Radiograph 02/18/2015.  Chest CT 02/16/2015 FINDINGS: Cardiomediastinal contours are unchanged allowing for patient rotation. Small left pleural effusion, diminished from prior. No evidence of right pleural effusion. Left basilar scarring. No pulmonary edema. No confluent airspace disease. No pneumothorax. IMPRESSION: Persistent but decreased left pleural effusion. Exam is otherwise unchanged. Electronically Signed   By: Jeb Levering M.D.   On: 03/14/2015 02:09   Dg Chest Port 1 View  02/18/2015  CLINICAL DATA:  Central line placement EXAM: PORTABLE CHEST 1 VIEW COMPARISON:  02/16/2015 FINDINGS: Cardiomediastinal silhouette is stable. No pulmonary edema. Tiny right pleural effusion. There is left IJ central line with tip in SVC. No pneumothorax. No segmental infiltrate. IMPRESSION: Left IJ central line in place.  No pneumothorax. Electronically Signed   By: Lahoma Crocker M.D.   On: 02/18/2015 14:44        Subjective: Right upper extremity pain and swelling improved. No new complaints reported. No chest pain.   Objective: Filed Vitals:   03/19/15 1843  03/19/15 2154 03/20/15 0505 03/20/15 1100  BP: 130/61 147/64 137/59 161/60  Pulse: 81 84 99 77  Temp: 97.8 F (36.6 C) 98.8 F (37.1 C) 98.5 F (36.9 C) 99.1 F (37.3 C)  TempSrc: Oral Oral Oral Oral  Resp: 16 18 18 18   Height:      Weight:      SpO2: 100% 100% 96% 99%    Intake/Output Summary (Last 24 hours) at 03/20/15 1242 Last data filed at 03/20/15 1100  Gross per 24 hour  Intake    170 ml  Output   1000 ml  Net   -830 ml   Weight change:  Exam:   General:  Pt is alert, follows commands appropriately, not in acute distress.   HEENT: No icterus, No thrush, No neck mass, [place]/AT  Cardiovascular: RRR, S1/S2, no rubs, no gallops. Telemetry: Sinus rhythm.  Respiratory: CTA bilaterally, no wheezing, no crackles, no rhonchi  Abdomen: Soft/+BS, non tender, non distended, no guarding  Extremities: 1+RLE edema, No lymphangitis, No petechiae, No rashes, no synovitis. Right leg in cam boot. Right upper extremity swelling improved some. No other acute findings.  Data Reviewed: Basic Metabolic Panel:  Recent Labs Lab 03/14/15 0426  03/16/15 0509 03/17/15 0703 03/18/15 0530 03/19/15 0553 03/20/15 1115  NA 130*  < > 132* 136 134* 138 139  K 5.1  < > 5.2* 4.3 4.7 4.8 4.8  CL 101  < > 103 107 103 106 105  CO2 21*  < > 20* 22 21* 22 23  GLUCOSE 124*  < > 130* 124* 182* 151* 118*  BUN 73*  < > 76* 69* 58* 59* 54*  CREATININE 4.32*  < > 3.96* 3.41* 2.69* 2.34* 2.28*  CALCIUM 9.0  < > 9.4 9.1 9.6 9.4 9.8  MG 2.2  --   --   --   --   --   --   PHOS  --   --   --   --  3.4 2.5 2.3*  < > = values in this interval not displayed. Liver Function Tests:  Recent Labs Lab 03/18/15 0530 03/19/15 0553 03/20/15 1115  ALBUMIN 2.2* 1.9* 2.0*   No results for input(s): LIPASE, AMYLASE in the last 168 hours. No results for input(s): AMMONIA in the last 168 hours. CBC:  Recent Labs Lab 03/14/15 0426 03/15/15 0651 03/16/15 0509 03/17/15 0703 03/19/15 0553 03/20/15 1115    WBC 8.0 7.9 9.4 6.4 7.8 5.8  NEUTROABS 6.0  --   --   --   --   --   HGB 7.4* 9.2* 8.9* 9.1* 8.7* 9.7*  HCT 22.7* 28.0* 27.6* 27.3* 27.0* 28.9*  MCV 81.1 82.8 83.4 85.8 84.9 85.0  PLT 174 174 173 155 151 137*   Cardiac Enzymes:  Recent Labs Lab 03/14/15 1139 03/14/15 2139 03/15/15 0906 03/17/15 0703 03/17/15 1211 03/17/15 1819  CKTOTAL  --   --  29*  --   --   --   TROPONINI 0.05* 0.05*  --  0.04* 0.04* 0.08*   BNP: Invalid input(s): POCBNP CBG: No results for input(s): GLUCAP in the last 168 hours.  Recent Results (from the past 240 hour(s))  Blood culture (routine x 2)     Status: None   Collection Time: 03/14/15  1:15 AM  Result Value Ref Range Status   Specimen Description BLOOD BLOOD LEFT FOREARM  Final   Special Requests BOTTLES DRAWN AEROBIC AND ANAEROBIC 5CC  Final   Culture  Setup Time   Final    GRAM POSITIVE COCCI IN CLUSTERS IN BOTH AEROBIC AND ANAEROBIC BOTTLES CRITICAL RESULT CALLED TO, READ BACK BY AND VERIFIED WITH: Vivianne Spence RN 2017 03/14/15 A BROWNING    Culture   Final    ENTEROCOCCUS SPECIES SUSCEPTIBILITIES PERFORMED ON PREVIOUS CULTURE WITHIN THE LAST 5 DAYS. STAPHYLOCOCCUS SPECIES (COAGULASE NEGATIVE) THE SIGNIFICANCE OF ISOLATING THIS ORGANISM FROM A SINGLE SET OF BLOOD CULTURES WHEN MULTIPLE SETS ARE DRAWN IS UNCERTAIN. PLEASE NOTIFY THE MICROBIOLOGY DEPARTMENT WITHIN ONE WEEK IF SPECIATION AND SENSITIVITIES ARE REQUIRED.    Report Status 03/17/2015 FINAL  Final  Blood culture (routine x 2)     Status: None   Collection Time: 03/14/15  1:33 AM  Result Value Ref Range Status   Specimen Description BLOOD RIGHT WRIST  Final   Special Requests BOTTLES DRAWN AEROBIC AND ANAEROBIC 5CC   Final   Culture  Setup Time   Final    GRAM POSITIVE COCCI IN CHAINS IN BOTH AEROBIC AND ANAEROBIC BOTTLES CRITICAL RESULT CALLED TO, READ BACK BY AND VERIFIED WITH: K MOORE,RN @0144  03/15/15 MKELLY    Culture ENTEROCOCCUS SPECIES  Final   Report Status  03/17/2015 FINAL  Final   Organism ID, Bacteria ENTEROCOCCUS SPECIES  Final      Susceptibility   Enterococcus species - MIC*    AMPICILLIN <=2 SENSITIVE Sensitive     VANCOMYCIN 1 SENSITIVE Sensitive     GENTAMICIN SYNERGY SENSITIVE Sensitive     * ENTEROCOCCUS SPECIES  Culture, blood (Routine X 2) w Reflex to ID Panel     Status: None   Collection Time: 03/16/15  5:00 AM  Result Value Ref Range Status   Specimen Description BLOOD LEFT HAND  Final   Special Requests AEROBIC BOTTLE ONLY 5ML  Final   Culture  Setup Time   Final    GRAM POSITIVE COCCI IN PAIRS IN CHAINS AEROBIC BOTTLE ONLY CRITICAL RESULT CALLED TO, READ BACK BY AND VERIFIED WITH: A. LOWE,RN AT [id number] ON [id number] BY Rhea Bleacher    Culture   Final    ENTEROCOCCUS  SPECIES SUSCEPTIBILITIES PERFORMED ON PREVIOUS CULTURE WITHIN THE LAST 5 DAYS.    Report Status 03/19/2015 FINAL  Final  Culture, blood (Routine X 2) w Reflex to ID Panel     Status: None (Preliminary result)   Collection Time: 03/16/15  5:04 AM  Result Value Ref Range Status   Specimen Description BLOOD LEFT FINGER  Final   Special Requests IN PEDIATRIC BOTTLE  Final   Culture  Setup Time   Final    GRAM POSITIVE COCCI IN CLUSTERS IN PAIRS AEROBIC BOTTLE ONLY CRITICAL RESULT CALLED TO, READ BACK BY AND VERIFIED WITHBarnabas Harries RN 2321 03/18/15 A BROWNING    Culture GRAM POSITIVE COCCI  Final   Report Status PENDING  Incomplete  Culture, blood (Routine X 2) w Reflex to ID Panel     Status: None (Preliminary result)   Collection Time: 03/18/15  8:54 AM  Result Value Ref Range Status   Specimen Description BLOOD BLOOD LEFT HAND  Final   Special Requests IN PEDIATRIC BOTTLE 2.5CC  Final   Culture NO GROWTH 1 DAY  Final   Report Status PENDING  Incomplete  Culture, blood (Routine X 2) w Reflex to ID Panel     Status: None (Preliminary result)   Collection Time: 03/18/15  9:01 AM  Result Value Ref Range Status   Specimen Description BLOOD BLOOD LEFT HAND   Final   Special Requests IN PEDIATRIC BOTTLE 1CC  Final   Culture NO GROWTH 1 DAY  Final   Report Status PENDING  Incomplete     Scheduled Meds: . allopurinol  200 mg Oral Daily  . amLODipine  5 mg Oral Daily  . ampicillin (OMNIPEN) IV  2 g Intravenous Q12H  . atorvastatin  40 mg Oral q1800  . calcitRIOL  0.25 mcg Oral QODAY  . calcitRIOL  0.5 mcg Oral QODAY  . carvedilol  25 mg Oral BID WC  . cefTRIAXone (ROCEPHIN)  IV  2 g Intravenous Q12H  . darbepoetin (ARANESP) injection - NON-DIALYSIS  100 mcg Subcutaneous Q Sat-1800  . famotidine  20 mg Oral Daily  . feeding supplement (NEPRO CARB STEADY)  237 mL Oral BID BM  . feeding supplement (PRO-STAT SUGAR FREE 64)  30 mL Oral Daily  . furosemide  40 mg Oral Daily  . isosorbide mononitrate  90 mg Oral Daily  . latanoprost  1 drop Both Eyes QHS  . magnesium oxide  400 mg Oral BID  . mycophenolate  180 mg Oral BID  . predniSONE  5 mg Oral Q breakfast  . ranolazine  500 mg Oral BID  . sodium bicarbonate  1,300 mg Oral BID  . tacrolimus  2 mg Oral BID  . Warfarin - Pharmacist Dosing Inpatient   Does not apply q1800   Continuous Infusions:   Amada Hallisey, MD, FACP, FHM. [facility] Pager [phone]  If 7PM-7AM, please contact night-coverage [email or web address] Password [email or web address] 03/20/2015, 12:42 PM   LOS: 6 days

## 2015-03-20 NOTE — Progress Notes (Addendum)
ANTICOAGULATION CONSULT NOTE  Pharmacy Consult for Warfarin  Indication: atrial fibrillation  Patient Measurements: Height: 5\' 1"  (154.9 cm) Weight: 155 lb 10.3 oz (70.6 kg) IBW/kg (Calculated) : 47.8  Vital Signs: Temp: 98.5 F (36.9 C) (02/26 0505) Temp Source: Oral (02/26 0505) BP: 137/59 mmHg (02/26 0505) Pulse Rate: 99 (02/26 0505)  Labs:  Recent Labs  03/17/15 1211 03/17/15 1819 03/18/15 0530 03/19/15 0553 03/19/15 0753  HGB  --   --   --  8.7*  --   HCT  --   --   --  27.0*  --   PLT  --   --   --  151  --   LABPROT  --   --  28.6* >90.0* 29.3*  INR  --   --  2.74* >10.00* 2.83*  CREATININE  --   --  2.69* 2.34*  --   TROPONINI 0.04* 0.08*  --   --   --     Estimated Creatinine Clearance: 21.5 mL/min (by C-G formula based on Cr of 2.34).   Assessment: 48 YOF s/p kidney transplant here with dizziness. She was recenetly discharged after episode of AKI and NSTEMI which was treated conservatively. She is on warfarin PTA for hypercoagulable state and hx DVT. INR was ~8 on 2/17 in the anti-coag clinic and instructions were to hold doses, admit INR was also high at 8.43.  Vitamin K 2.5mg  po x1 given 2/20, she was also transfused on 2/21.  INR down to 2.83 at goal on 2/25. Now INR 3.56 one day later. Previously, Hgb 8.7, Plts 151- stable, no bleeding noted.   Goal of Therapy:  INR 2-3 Monitor platelets by anticoagulation protocol: Yes   Plan:  -Hold warfarin -Daily PT/INR -Monitor for bleeding   Melburn Popper, PharmD Clinical Pharmacy Resident Pager: [phone] 03/20/2015 10:47 AM

## 2015-03-20 NOTE — Consult Note (Signed)
ORTHOPAEDIC CONSULTATION  REQUESTING PHYSICIAN: Modena Jansky, MD  Chief Complaint: Swelling and pain right upper extremity.  HPI: Debbie [name] is a 65 y.o. female who presents with pain and swelling right upper extremity. Patient is status post a rejection of a renal transplant. She has end-stage renal disease bacteremia and severe protein caloric malnutrition.  Past Medical History  Diagnosis Date  . Kidney transplant as cause of abnormal reaction or later complication   . Hypertension   . Colon polyps   . Gall stones 1980  . Anti-phospholipid antibody syndrome [facility])     (Based on hospital discharge summary march 2013)  . Paroxysmal atrial fibrillation (HCC)   . Carpal tunnel syndrome 2003    lt  . Esophageal stricture   . Hemorrhoids   . Renal disorder   . ESRD (end stage renal disease) on dialysis [facility])     "til I got my transplant in 2010"  . DVT (deep venous thrombosis) (HCC) 1990    LLE  . History of blood transfusion     "related to the lupus"  . Dyslipidemia 07/22/2013  . Coronary artery disease   . Heart murmur     mild MR on echo  . Pneumonia     "3 times now" (08/25/2013)  . GERD (gastroesophageal reflux disease)   . Arthritis     "fingers" (08/25/2013)  . Glaucoma, left eye   . Bilateral carotid artery stenosis     1-39%  . Pulmonary HTN (HCC)     PASP 26mmHg   Past Surgical History  Procedure Laterality Date  . Nephrectomy transplanted organ  05/2008  . Cholecystectomy  1980  . Hematoma evacuation  1998    "after they took peritoneal catheter out"  . Peritoneal catheter insertion    . Peritoneal catheter removal    . Carpal tunnel release Right 07/2001  . Arteriovenous graft placement    . Thrombectomy and revision of arterioventous (av) goretex  graft Right 07/1999; 04/2002; 09/2002; 12/2005; 09/2007;     upper arm/notes 07/31/1999; 05/18/2002; 10/07/2002; 01/14/2006; 09/14/2007;   . Right heart catheterization N/A 08/28/2013    Procedure: RIGHT HEART  CATH;  Surgeon: Sinclair Grooms, MD;  Location: [facility] CATH LAB;  Service: Cardiovascular;  Laterality: N/A;   Social History   Social History  . Marital Status: Widowed    Spouse Name: N/A  . Number of Children: 3  . Years of Education: N/A   Occupational History  . disabled    Social History Main Topics  . Smoking status: Former Smoker -- 0.00 packs/day for 30 years    Types: Cigarettes    Quit date: 04/16/1977  . Smokeless tobacco: Never Used  . Alcohol Use: Yes  . Drug Use: No  . Sexual Activity: Not Currently   Other Topics Concern  . None   Social History Narrative   Family History  Problem Relation Age of Onset  . Hypertension Mother   . Heart disease Mother   . Colon cancer Neg Hx   . Rectal cancer Neg Hx   . Stomach cancer Neg Hx    - negative except otherwise stated in the family history section Allergies  Allergen Reactions  . Feraheme [Ferumoxytol] Other (See Comments)    Chest pain, pulsating 02/17/15: tolerated Nulecit  . Vancomycin Anaphylaxis, Itching, Swelling and Other (See Comments)    Tongue swell  . Dorzolamide Hcl-Timolol Mal Rash and Other (See Comments)    Eye pain  .  Gentamycin [Gentamicin Sulfate] Itching and Swelling  . Latanoprost Rash and Other (See Comments)    Eye pain  . Cefazolin Itching  . Codeine Itching  . Hydrocodone-Acetaminophen Itching  . Penicillins Itching    Has patient had a PCN reaction causing immediate rash, facial/tongue/throat swelling, SOB or lightheadedness with hypotension:No Has patient had a PCN reaction causing severe rash involving mucus membranes or skin necrosis:No Has patient had a PCN reaction that required hospitalization:No Has patient had a PCN reaction occurring within the last 10 years:No If all of the above answers are "NO", then may proceed with Cephalosporin use.    Prior to Admission medications   Medication Sig Start Date End Date Taking? Authorizing Provider  acetaminophen (TYLENOL) 325 MG  tablet Take 2 tablets (650 mg total) by mouth every 6 (six) hours as needed for mild pain (or Fever >/= 101). 02/21/15  Yes Modena Jansky, MD  allopurinol (ZYLOPRIM) 100 MG tablet Take 2 tablets (200 mg total) by mouth daily. 02/21/15  Yes Modena Jansky, MD  atorvastatin (LIPITOR) 40 MG tablet Take 1 tablet (40 mg total) by mouth daily at 6 PM. 02/21/15  Yes Modena Jansky, MD  bimatoprost (LUMIGAN) 0.01 % SOLN Place 1 drop into both eyes at bedtime.  05/21/13  Yes Historical Provider, MD  Brinzolamide-Brimonidine (SIMBRINZA) 1-0.2 % SUSP Place 1 drop into the left eye 3 (three) times daily. 09/14/14  Yes Historical Provider, MD  calcitRIOL (ROCALTROL) 0.25 MCG capsule Take 0.25-0.5 mcg by mouth every other day. alternating dosages   Yes Historical Provider, MD  carvedilol (COREG) 6.25 MG tablet Take 1 tablet (6.25 mg total) by mouth 2 (two) times daily with a meal. 02/21/15  Yes Modena Jansky, MD  furosemide (LASIX) 40 MG tablet Take 1 tablet (40 mg total) by mouth daily. 02/21/15  Yes Modena Jansky, MD  isosorbide mononitrate (IMDUR) 60 MG 24 hr tablet Take 1 tablet (60 mg total) by mouth daily. 02/21/15  Yes Modena Jansky, MD  MAGNESIUM-OXIDE 400 (241.3 MG) MG tablet TAKE 1 TABLET BY MOUTH TWICE DAILY 10/26/14  Yes Sueanne Margarita, MD  mycophenolate (MYFORTIC) 180 MG EC tablet Take 180 mg by mouth 2 (two) times daily.    Yes Historical Provider, MD  nitroGLYCERIN (NITROSTAT) 0.4 MG SL tablet Place 0.4 mg under the tongue every 5 (five) minutes as needed for chest pain.  08/26/13  Yes Historical Provider, MD  oxyCODONE-acetaminophen (PERCOCET/ROXICET) 5-325 MG tablet Take 1 tablet by mouth every 6 (six) hours as needed for moderate pain or severe pain. Patient taking differently: Take 0.5-1 tablets by mouth every 6 (six) hours as needed for moderate pain or severe pain.  02/21/15  Yes Modena Jansky, MD  predniSONE (DELTASONE) 5 MG tablet Take 5 mg by mouth daily with breakfast.  04/24/12  Yes  Historical Provider, MD  RANEXA 500 MG 12 hr tablet TAKE 1 TABLET BY MOUTH TWICE DAILY 01/11/15  Yes Sueanne Margarita, MD  ranitidine (ZANTAC) 150 MG tablet Take 150 mg by mouth daily.    Yes Historical Provider, MD  sodium bicarbonate 650 MG tablet Take 1,300 mg by mouth 2 (two) times daily.  07/13/11  Yes Historical Provider, MD  tacrolimus (PROGRAF) 1 MG capsule Take 2 mg by mouth 2 (two) times daily.    Yes Historical Provider, MD  warfarin (COUMADIN) 2.5 MG tablet Take 0.5-1 tablets (1.25-2.5 mg total) by mouth daily at 6 PM. Monday and Friday 2.5 mg and 1.25  mg on all other days 02/21/15  Yes Modena Jansky, MD  isosorbide dinitrate (ISORDIL) 10 MG tablet TAKE 1 TABLET THREE TIMES DAILY 03/14/15   Sueanne Margarita, MD  labetalol (NORMODYNE) 100 MG tablet Take 100 mg by mouth 2 (two) times daily. Reported on 03/13/2015 12/15/14   Historical Provider, MD  methocarbamol (ROBAXIN) 500 MG tablet Take 1 tablet (500 mg total) by mouth 2 (two) times daily. Patient not taking: Reported on 03/03/2015 01/23/15   Marella Chimes, PA-C  timolol (TIMOPTIC) 0.5 % ophthalmic solution Place 1 drop into both eyes 2 (two) times daily. Reported on 03/13/2015 05/21/13   Historical Provider, MD   No results found. - pertinent xrays, CT, MRI studies were reviewed and independently interpreted  Positive ROS: All other systems have been reviewed and were otherwise negative with the exception of those mentioned in the HPI and as above.  Physical Exam: General: Alert, no acute distress Cardiovascular: Edema right upper extremity worse than left upper extremity Respiratory: No cyanosis, no use of accessory musculature GI: No organomegaly, abdomen is soft and non-tender Skin: No lesions in the area of chief complaint Neurologic: Sensation intact distally Psychiatric: Patient is competent for consent with normal mood and affect Lymphatic: Patient has significant lymphedema right upper extremity  MUSCULOSKELETAL:  On  examination patient has swelling of the entire right upper extremity swelling of the hand swelling of the arm forearm she does complain of shoulder pain. With passive range of motion of the shoulder she is asymptomatic. Patient has a history of gout and states that she normally takes allopurinol.  Assessment: Assessment: End-stage renal disease with bacteremia and severe protein caloric malnutrition with swelling of the entire right upper extremity with a history of gout.  Plan: Plan: Uric acid level was ordered. With pain-free range of motion of the right shoulder the swelling is less likely a septic joint. Recommend starting colchicine 0.6 mg twice a day after uric acid is obtained. I will follow-up as needed.  Thank you for the consult and the opportunity to see Ms. Cassandria Santee, MD [facility] [phone] 10:23 AM

## 2015-03-21 ENCOUNTER — Other Ambulatory Visit: Payer: Self-pay

## 2015-03-21 ENCOUNTER — Inpatient Hospital Stay (HOSPITAL_COMMUNITY): Payer: Medicare Other

## 2015-03-21 DIAGNOSIS — M25419 Effusion, unspecified shoulder: Secondary | ICD-10-CM | POA: Diagnosis present

## 2015-03-21 LAB — CBC
HCT: 27.3 % — ABNORMAL LOW (ref 36.0–46.0)
Hemoglobin: 8.8 g/dL — ABNORMAL LOW (ref 12.0–15.0)
MCH: 27.1 pg (ref 26.0–34.0)
MCHC: 32.2 g/dL (ref 30.0–36.0)
MCV: 84 fL (ref 78.0–100.0)
Platelets: 125 10*3/uL — ABNORMAL LOW (ref 150–400)
RBC: 3.25 MIL/uL — ABNORMAL LOW (ref 3.87–5.11)
RDW: 17.5 % — ABNORMAL HIGH (ref 11.5–15.5)
WBC: 7.9 10*3/uL (ref 4.0–10.5)

## 2015-03-21 LAB — RENAL FUNCTION PANEL
Albumin: 2 g/dL — ABNORMAL LOW (ref 3.5–5.0)
Anion gap: 10 (ref 5–15)
BUN: 53 mg/dL — ABNORMAL HIGH (ref 6–20)
CO2: 21 mmol/L — ABNORMAL LOW (ref 22–32)
Calcium: 9.1 mg/dL (ref 8.9–10.3)
Chloride: 106 mmol/L (ref 101–111)
Creatinine, Ser: 2.25 mg/dL — ABNORMAL HIGH (ref 0.44–1.00)
GFR calc Af Amer: 25 mL/min — ABNORMAL LOW (ref >60)
GFR calc non Af Amer: 22 mL/min — ABNORMAL LOW (ref >60)
Glucose, Bld: 135 mg/dL — ABNORMAL HIGH (ref 65–99)
Phosphorus: 2.5 mg/dL (ref 2.5–4.6)
Potassium: 4.8 mmol/L (ref 3.5–5.1)
Sodium: 137 mmol/L (ref 135–145)

## 2015-03-21 LAB — PROTIME-INR
INR: 3.99 — ABNORMAL HIGH (ref 0.00–1.49)
PROTHROMBIN TIME: 37.9 s — AB (ref 11.6–15.2)

## 2015-03-21 MED ORDER — SODIUM CHLORIDE 0.9 % IV SOLN
2.0000 g | Freq: Three times a day (TID) | INTRAVENOUS | Status: DC
  Administered 2015-03-21 – 2015-03-23 (×6): 2 g via INTRAVENOUS
  Filled 2015-03-21 (×9): qty 2000

## 2015-03-21 MED ORDER — LIDOCAINE HCL (PF) 1 % IJ SOLN
INTRAMUSCULAR | Status: AC
  Filled 2015-03-21: qty 15

## 2015-03-21 NOTE — Progress Notes (Signed)
PROGRESS NOTE  Debbie [name] [id number] DOB: 06-18-1950 DOA: 03/13/2015 PCP: No PCP Per Patient   Brief History 65 year old female patient with history of renal transplant 2010 on chronic immunosuppression with tacrolimus and mycophenolate, HTN, antiphospholipid antibody syndrome with hypercoagulable state causing venous thrombosis of upper extremities for which she is on chronic Coumadin, PAF, dyslipidemia, CAD (positive Myoview June 2015-conservatively treated due to chronic kidney disease), SLE, CKD 4 with baseline creatinine 2.2-2.3, recent fall and right fibular and medial malleolus fracture-managed by cast and nonweightbearing (seen by Dr. Jacques Navy appointment 02/16/15) presented to [facility] with 1 day history of dizziness and generalized weakness. The patient was recently discharged from the hospital on 02/21/2015 after treatment for NSTEMI and acute on chronic renal failure. The patient states that she has had poor by mouth intake for the past 2-3 days with constipation. Upon presentation, she was noted to have a serum creatinine of 4.31. The patient received 2 L bolus of fluid in the emergency department. She was also found to have INR of 8.14. On the afternoon of 03/14/2015, the patient developed substernal chest pain for which she received nitroglycerin with relief. Cardiology was consulted. EKG showed a new LBBB.  Assessment/Plan: Acute on chronic renal failure (CKD 4) in pt with renal transplant -due to volume depletion and infection -restarted judicious IVF -baseline creatinine 2.2-2.3 -Patient endorses compliance with all her medications including immunosuppressive therapy -renal ultrasound: Native kidneys not identified possibly due to atrophy. Transplanted kidney without hydronephrosis. - Creatinine was 2.2 on 1/30. Admitted with creatinine of 4.31 and is gradually improving (3.41 on 2/23). - Requested nephrology consultation given complex renal issues and  patient wishes to speak with them prior to deciding on TEE. - Creatinine down to 2.3 which is her baseline. IV fluids discontinued. Nephrology follow-up appreciated.  Mild hyperkalemia - Resolved after a dose of Kayexalate.  Enterococcus bacteremia -source unclear. 2/20: 1/2 blood cultures positive for enterococcus. 2nd blood culture positive for enterococcus and Coag neg staphylococcus. 2/22: Blood culture 2: Also positive - pt has anaphylaxis to vancomycin - Infectious disease follow-up appreciated. ID recommends TEE - patient had been contemplating and reluctant all along but has finally consented to having it on 2/26. - Cubicin changed to IV ampicillin & Rocephin. Surveillance blood culture(2/24)  neg to date.  - CT right upper extremity shows right shoulder effusion. -  orthopedics consultation appreciated, discussed with Dr. Sharol Given 2/26 and he was requested IR aspirate right shoulder. He was not convinced of gout.  Chest pain and new LBBB on EKG -Concerning for angina with new left bundle branch block -relieved after SL NTG x 1 -EKG--new LBBB -pt had NSTEMI last admission in Jan 2017 -02/15/2015 echo EF 40-45%, PAP 60 -continue ASA -continue imdur/Ranexa -defer Ranexa pharmacy concern to cardiology -elevated troponins likely due to renal failure - Cardiology follow-up appreciated: Suspect anemia contribute it to her angina. Continue carvedilol, Lipitor, Imdur and Ranexa. Cardiology does not recommend any further workup and had signed off 2/22. However due to recurrent NTG responsive chest pain, cardiology has seen again on 2/23. EKG without acute changes and no LBBB at this time (LBBB is intermittent).  - Cardiology has increased dose of Imdur and beta blocker. Ranexa held secondary to renal insufficiency-consider resuming as renal function improves. - No ischemia workup planned.  Right upper extremity pain and swelling - Reported on 2/23. Right upper extremity distal to elbow does  appear diffusely swollen but not  warm and is also tender without other features of cellulitis or arthritis. Good radial pulsation felt.? Secondary to IV fluids versus rule out DVT versus gout flare. - Elevated right upper extremity. Requested charge nurse to cut restricting band on right wrist. Right upper extremity venous Doppler negative for DVT. Did receive a dose of prednisone 40 mg on 2/23. Right upper extremity swelling and pain improved. As per nephrology, she may need further evaluation for central vein stenosis-has right upper extremity AV graft. - CT shows right shoulder effusion. Requested orthopedic/Dr. Sharol Given consultation appreciated: discussed with Dr. Sharol Given 2/26 and he was requested IR aspirate right shoulder. He was not convinced of gout.  Hyponatremia -Secondary to volume depletion - Sodium normal today.  Paroxysmal atrial fibrillation -Presently in sinus rhythm -Rate controlled -Continue warfarin. INR therapeutic.  Warfarin-induced coagulopathy -Patient has supratherapeutic INR--8.43 on admission. Decreased. -As the patient is not actively bleeding and hemodynamically stable, allow INR to drift down -vitamin K if bleed or INR continues to rise  Hypertension -Continue carvedilol and Imdur. Dose increased by cardiology  SLE/positive anticardiolipin antibodies/history of DVT/PE:  -On chronic Coumadin at home. INR as above.. Status post renal transplant with chronic allograft nephropathy: Continue immunosuppressants  Anemia of CKD -Baseline hemoglobin 9-10 -transfuse one unit PRBC in setting of drop in Hgb and chest pain. Stable.  Right fibular and medial malleolus fracture -As per cardiology, patient would be high risk for surgery with a recent NSTEMI -continue CAM boot -outpt follow up with Dr. Ninfa Linden   DVT prophylaxis: Anticoagulated on Coumadin. CODE STATUS: Full Family Communication: None at bedside Disposition Plan: DC home when medically  stable.    Procedures/Studies: Ct Head Wo Contrast  03/14/2015  CLINICAL DATA:  Acute onset of dizziness.  Initial encounter. EXAM: CT HEAD WITHOUT CONTRAST TECHNIQUE: Contiguous axial images were obtained from the base of the skull through the vertex without intravenous contrast. COMPARISON:  CT of the head performed 02/14/2015 FINDINGS: There is no evidence of acute infarction, mass lesion, or intra- or extra-axial hemorrhage on CT. Prominence of the ventricles sulci reflects mild cortical volume loss. The brainstem and fourth ventricle are within normal limits. The basal ganglia are unremarkable in appearance. The cerebral hemispheres demonstrate grossly normal gray-white differentiation. No mass effect or midline shift is seen. There is no evidence of fracture; visualized osseous structures are unremarkable in appearance. The visualized portions of the orbits are within normal limits. The paranasal sinuses and mastoid air cells are well-aerated. No significant soft tissue abnormalities are seen. IMPRESSION: 1. No acute intracranial pathology seen on CT. 2. Mild cortical volume loss noted. Electronically Signed   By: Garald Balding M.D.   On: 03/14/2015 00:35   Ct Humerus Right Wo Contrast  03/18/2015  CLINICAL DATA:  [place] 3-4 days ago.  Injured right upper extremity. EXAM: CT OF THE RIGHT SHOULDER WITHOUT CONTRAST; CT OF THE RIGHT HUMERUS WITHOUT CONTRAST; CT OF THE RIGHT FOREARM WITHOUT CONTRAST TECHNIQUE: Multidetector CT imaging was performed according to the standard protocol. Multiplanar CT image reconstructions were also generated. COMPARISON:  None. FINDINGS: No acute fractures are identified. The shoulder joint is maintained. No fracture or dislocation. Mild AC joint degenerative changes. No AC joint separation. No humeral fracture. There is extensive subacromial/subdeltoid and subscapularis bursitis. There is also a large glenohumeral joint effusion and severe synovitis. Dialysis grafts are  noted in the right upper extremity. The elbow joint is maintained. No elbow fracture or obvious joint effusion. Headache cardiac No forearm fractures are identified. There  is a moderate-sized right pleural effusion with overlying atelectasis. No obvious acute right rib fractures are identified. The thoracic vertebral bodies are intact. Moderate degenerative changes noted at the sternoclavicular joints. Small scarred right kidney is noted. There is advanced atherosclerotic calcifications involving the aorta and branch vessels. IMPRESSION: No acute fractures of the right upper extremity are identified. The joints are maintained. Extensive synovitis, joint effusion and bursitis involving the right shoulder. Electronically Signed   By: Marijo Sanes M.D.   On: 03/18/2015 09:11   Ct Shoulder Right Wo Contrast  03/18/2015  CLINICAL DATA:  [place] 3-4 days ago.  Injured right upper extremity. EXAM: CT OF THE RIGHT SHOULDER WITHOUT CONTRAST; CT OF THE RIGHT HUMERUS WITHOUT CONTRAST; CT OF THE RIGHT FOREARM WITHOUT CONTRAST TECHNIQUE: Multidetector CT imaging was performed according to the standard protocol. Multiplanar CT image reconstructions were also generated. COMPARISON:  None. FINDINGS: No acute fractures are identified. The shoulder joint is maintained. No fracture or dislocation. Mild AC joint degenerative changes. No AC joint separation. No humeral fracture. There is extensive subacromial/subdeltoid and subscapularis bursitis. There is also a large glenohumeral joint effusion and severe synovitis. Dialysis grafts are noted in the right upper extremity. The elbow joint is maintained. No elbow fracture or obvious joint effusion. Headache cardiac No forearm fractures are identified. There is a moderate-sized right pleural effusion with overlying atelectasis. No obvious acute right rib fractures are identified. The thoracic vertebral bodies are intact. Moderate degenerative changes noted at the sternoclavicular joints.  Small scarred right kidney is noted. There is advanced atherosclerotic calcifications involving the aorta and branch vessels. IMPRESSION: No acute fractures of the right upper extremity are identified. The joints are maintained. Extensive synovitis, joint effusion and bursitis involving the right shoulder. Electronically Signed   By: Marijo Sanes M.D.   On: 03/18/2015 09:11   Ct Forearm Right Wo Contrast  03/18/2015  CLINICAL DATA:  [place] 3-4 days ago.  Injured right upper extremity. EXAM: CT OF THE RIGHT SHOULDER WITHOUT CONTRAST; CT OF THE RIGHT HUMERUS WITHOUT CONTRAST; CT OF THE RIGHT FOREARM WITHOUT CONTRAST TECHNIQUE: Multidetector CT imaging was performed according to the standard protocol. Multiplanar CT image reconstructions were also generated. COMPARISON:  None. FINDINGS: No acute fractures are identified. The shoulder joint is maintained. No fracture or dislocation. Mild AC joint degenerative changes. No AC joint separation. No humeral fracture. There is extensive subacromial/subdeltoid and subscapularis bursitis. There is also a large glenohumeral joint effusion and severe synovitis. Dialysis grafts are noted in the right upper extremity. The elbow joint is maintained. No elbow fracture or obvious joint effusion. Headache cardiac No forearm fractures are identified. There is a moderate-sized right pleural effusion with overlying atelectasis. No obvious acute right rib fractures are identified. The thoracic vertebral bodies are intact. Moderate degenerative changes noted at the sternoclavicular joints. Small scarred right kidney is noted. There is advanced atherosclerotic calcifications involving the aorta and branch vessels. IMPRESSION: No acute fractures of the right upper extremity are identified. The joints are maintained. Extensive synovitis, joint effusion and bursitis involving the right shoulder. Electronically Signed   By: Marijo Sanes M.D.   On: 03/18/2015 09:11   US Renal  03/15/2015   CLINICAL DATA:  Acute renal failure superimposed on stage 4 chronic kidney disease. EXAM: RENAL / URINARY TRACT ULTRASOUND COMPLETE COMPARISON:  Included portions from chest CT 02/18/2015 FINDINGS: Right Kidney: Not visualized. Left Kidney: Not visualized. Transplant kidney located in the pelvis, side not specified. There is no transplant hydronephrosis. Blood  flow seen. Transplant kidney measures 10 cm. Transplant cortical thickness appears maintained. No focal lesion. Detailed transplant Doppler evaluation not performed. Bladder: Appears normal for degree of bladder distention. IMPRESSION: 1. Native kidneys not identified, likely secondary renal atrophy. 2. Pelvic transplant kidney without hydronephrosis. Electronically Signed   By: Jeb Levering M.D.   On: 03/15/2015 23:08   Dg Chest Portable 1 View  03/14/2015  CLINICAL DATA:  Fever and weakness tonight. EXAM: PORTABLE CHEST 1 VIEW COMPARISON:  Radiograph 02/18/2015.  Chest CT 02/16/2015 FINDINGS: Cardiomediastinal contours are unchanged allowing for patient rotation. Small left pleural effusion, diminished from prior. No evidence of right pleural effusion. Left basilar scarring. No pulmonary edema. No confluent airspace disease. No pneumothorax. IMPRESSION: Persistent but decreased left pleural effusion. Exam is otherwise unchanged. Electronically Signed   By: Jeb Levering M.D.   On: 03/14/2015 02:09   Dg Fluoro Guided Needle Plc Aspiration/injection Loc  03/21/2015  CLINICAL DATA:  Sinusitis, joint effusion and bursitis involving the right shoulder on CT 03/17/2015. EXAM: RIGHT SHOULDER ASPIRATION UNDER FLUOROSCOPY TECHNIQUE: Despite an INR of 3.99, Dr. Meridee Score insisted on a relatively emergent right shoulder aspiration due to the possibility of sepsis. The increased risk of bleeding was discussed with the patient prior to the procedure, as well as the risk of infection. Patient elected to proceed with the procedure. Informed consent was  obtained. An appropriate skin entrance site was determined. The site was marked, prepped with Betadine, draped in the usual sterile fashion, and infiltrated locally with buffered Lidocaine. Based on CT imaging, 80 20 gauge spinal needle was inserted over the mid humeral head and 5-60 cc of serosanguineous fluid returned. No immediate complication. FLUOROSCOPY TIME:  Radiation Exposure Index (as provided by the fluoroscopic device): If the device does not provide the exposure index: Fluoroscopy Time (in minutes and seconds):  0 minutes 24 seconds. Number of Acquired Images:  None. FINDINGS: Despite an INR of 3.99, Dr. Meridee Score insisted on a relatively emergent right shoulder aspiration due to the possibility of sepsis. The increased risk of bleeding was discussed with the patient prior to the procedure, as well as the risk of infection. Patient elected to proceed with the procedure. Informed consent was obtained. An appropriate skin entrance site was determined. The site was marked, prepped with Betadine, draped in the usual sterile fashion, and infiltrated locally with buffered Lidocaine. Based on CT imaging, a 20 gauge spinal needle was inserted over the mid humeral head with return of 5-6 cc of serosanguineous fluid. No immediate complication. IMPRESSION: Technically successful right shoulder aspiration. Electronically Signed   By: Lorin Picket M.D.   On: 03/21/2015 15:16        Subjective: No new complaints. Denies right upper extremity pain. Swelling of right upper extremity persists.  Objective: Filed Vitals:   03/20/15 1700 03/20/15 2100 03/21/15 0500 03/21/15 0940  BP: 169/62 114/50 124/56 120/70  Pulse: 103 83 74 71  Temp: 99.8 F (37.7 C) 98.8 F (37.1 C) 98.2 F (36.8 C) 98.5 F (36.9 C)  TempSrc: Oral Oral Oral Oral  Resp: 20 20 20 20   Height:      Weight:      SpO2: 97% 95% 98% 98%    Intake/Output Summary (Last 24 hours) at 03/21/15 1642 Last data filed at 03/21/15  0910  Gross per 24 hour  Intake      0 ml  Output    650 ml  Net   -650 ml   Weight change:  Exam:   General:  Pt is alert, follows commands appropriately, not in acute distress.   HEENT: No icterus, No thrush, No neck mass, [place]/AT  Cardiovascular: RRR, S1/S2, no rubs, no gallops. Telemetry: Sinus rhythm.  Respiratory: CTA bilaterally, no wheezing, no crackles, no rhonchi  Abdomen: Soft/+BS, non tender, non distended, no guarding  Extremities: 1+RLE edema, No lymphangitis, No petechiae, No rashes, no synovitis. Right leg in cam boot. Right upper extremity swelling improved some. No other acute findings.  Data Reviewed: Basic Metabolic Panel:  Recent Labs Lab 03/17/15 0703 03/18/15 0530 03/19/15 0553 03/20/15 1115 03/21/15 0527  NA 136 134* 138 139 137  K 4.3 4.7 4.8 4.8 4.8  CL 107 103 106 105 106  CO2 22 21* 22 23 21*  GLUCOSE 124* 182* 151* 118* 135*  BUN 69* 58* 59* 54* 53*  CREATININE 3.41* 2.69* 2.34* 2.28* 2.25*  CALCIUM 9.1 9.6 9.4 9.8 9.1  PHOS  --  3.4 2.5 2.3* 2.5   Liver Function Tests:  Recent Labs Lab 03/18/15 0530 03/19/15 0553 03/20/15 1115 03/21/15 0527  ALBUMIN 2.2* 1.9* 2.0* 2.0*   No results for input(s): LIPASE, AMYLASE in the last 168 hours. No results for input(s): AMMONIA in the last 168 hours. CBC:  Recent Labs Lab 03/16/15 0509 03/17/15 0703 03/19/15 0553 03/20/15 1115 03/21/15 0527  WBC 9.4 6.4 7.8 5.8 7.9  HGB 8.9* 9.1* 8.7* 9.7* 8.8*  HCT 27.6* 27.3* 27.0* 28.9* 27.3*  MCV 83.4 85.8 84.9 85.0 84.0  PLT 173 155 151 137* 125*   Cardiac Enzymes:  Recent Labs Lab 03/14/15 2139 03/15/15 0906 03/17/15 0703 03/17/15 1211 03/17/15 1819  CKTOTAL  --  29*  --   --   --   TROPONINI 0.05*  --  0.04* 0.04* 0.08*   BNP: Invalid input(s): POCBNP CBG: No results for input(s): GLUCAP in the last 168 hours.  Recent Results (from the past 240 hour(s))  Blood culture (routine x 2)     Status: None   Collection Time:  03/14/15  1:15 AM  Result Value Ref Range Status   Specimen Description BLOOD BLOOD LEFT FOREARM  Final   Special Requests BOTTLES DRAWN AEROBIC AND ANAEROBIC 5CC  Final   Culture  Setup Time   Final    GRAM POSITIVE COCCI IN CLUSTERS IN BOTH AEROBIC AND ANAEROBIC BOTTLES CRITICAL RESULT CALLED TO, READ BACK BY AND VERIFIED WITH: Vivianne Spence RN 2017 03/14/15 A BROWNING    Culture   Final    ENTEROCOCCUS SPECIES SUSCEPTIBILITIES PERFORMED ON PREVIOUS CULTURE WITHIN THE LAST 5 DAYS. STAPHYLOCOCCUS SPECIES (COAGULASE NEGATIVE) THE SIGNIFICANCE OF ISOLATING THIS ORGANISM FROM A SINGLE SET OF BLOOD CULTURES WHEN MULTIPLE SETS ARE DRAWN IS UNCERTAIN. PLEASE NOTIFY THE MICROBIOLOGY DEPARTMENT WITHIN ONE WEEK IF SPECIATION AND SENSITIVITIES ARE REQUIRED.    Report Status 03/17/2015 FINAL  Final  Blood culture (routine x 2)     Status: None   Collection Time: 03/14/15  1:33 AM  Result Value Ref Range Status   Specimen Description BLOOD RIGHT WRIST  Final   Special Requests BOTTLES DRAWN AEROBIC AND ANAEROBIC 5CC   Final   Culture  Setup Time   Final    GRAM POSITIVE COCCI IN CHAINS IN BOTH AEROBIC AND ANAEROBIC BOTTLES CRITICAL RESULT CALLED TO, READ BACK BY AND VERIFIED WITH: K MOORE,RN @0144  03/15/15 MKELLY    Culture ENTEROCOCCUS SPECIES  Final   Report Status 03/17/2015 FINAL  Final   Organism ID, Bacteria ENTEROCOCCUS SPECIES  Final  Susceptibility   Enterococcus species - MIC*    AMPICILLIN <=2 SENSITIVE Sensitive     VANCOMYCIN 1 SENSITIVE Sensitive     GENTAMICIN SYNERGY SENSITIVE Sensitive     * ENTEROCOCCUS SPECIES  Culture, blood (Routine X 2) w Reflex to ID Panel     Status: None   Collection Time: 03/16/15  5:00 AM  Result Value Ref Range Status   Specimen Description BLOOD LEFT HAND  Final   Special Requests AEROBIC BOTTLE ONLY 5ML  Final   Culture  Setup Time   Final    GRAM POSITIVE COCCI IN PAIRS IN CHAINS AEROBIC BOTTLE ONLY CRITICAL RESULT CALLED TO, READ BACK BY  AND VERIFIED WITH: A. LOWE,RN AT [id number] ON [id number] BY Rhea Bleacher    Culture   Final    ENTEROCOCCUS SPECIES SUSCEPTIBILITIES PERFORMED ON PREVIOUS CULTURE WITHIN THE LAST 5 DAYS.    Report Status 03/19/2015 FINAL  Final  Culture, blood (Routine X 2) w Reflex to ID Panel     Status: None (Preliminary result)   Collection Time: 03/16/15  5:04 AM  Result Value Ref Range Status   Specimen Description BLOOD LEFT FINGER  Final   Special Requests IN PEDIATRIC BOTTLE  Final   Culture  Setup Time   Final    GRAM POSITIVE COCCI IN CLUSTERS IN PAIRS AEROBIC BOTTLE ONLY CRITICAL RESULT CALLED TO, READ BACK BY AND VERIFIED WITHBarnabas Harries RN 2321 03/18/15 A BROWNING    Culture   Final    GRAM POSITIVE COCCI CULTURE REINCUBATED FOR BETTER GROWTH    Report Status PENDING  Incomplete  Culture, blood (Routine X 2) w Reflex to ID Panel     Status: None (Preliminary result)   Collection Time: 03/18/15  8:54 AM  Result Value Ref Range Status   Specimen Description BLOOD BLOOD LEFT HAND  Final   Special Requests IN PEDIATRIC BOTTLE 2.5CC  Final   Culture NO GROWTH 3 DAYS  Final   Report Status PENDING  Incomplete  Culture, blood (Routine X 2) w Reflex to ID Panel     Status: None (Preliminary result)   Collection Time: 03/18/15  9:01 AM  Result Value Ref Range Status   Specimen Description BLOOD BLOOD LEFT HAND  Final   Special Requests IN PEDIATRIC BOTTLE 1CC  Final   Culture NO GROWTH 3 DAYS  Final   Report Status PENDING  Incomplete  Body fluid culture     Status: None (Preliminary result)   Collection Time: 03/21/15  2:00 PM  Result Value Ref Range Status   Specimen Description FLUID SYNOVIAL RIGHT SHOULDER  Final   Special Requests NONE  Final   Gram Stain   Final    ABUNDANT WBC PRESENT, PREDOMINANTLY PMN NO ORGANISMS SEEN    Culture PENDING  Incomplete   Report Status PENDING  Incomplete     Scheduled Meds: . allopurinol  200 mg Oral Daily  . amLODipine  5 mg Oral Daily  .  ampicillin (OMNIPEN) IV  2 g Intravenous 3 times per day  . atorvastatin  40 mg Oral q1800  . calcitRIOL  0.25 mcg Oral QODAY  . calcitRIOL  0.5 mcg Oral QODAY  . carvedilol  25 mg Oral BID WC  . cefTRIAXone (ROCEPHIN)  IV  2 g Intravenous Q12H  . darbepoetin (ARANESP) injection - NON-DIALYSIS  100 mcg Subcutaneous Q Sat-1800  . famotidine  20 mg Oral Daily  . feeding supplement (NEPRO CARB STEADY)  237 mL Oral  BID BM  . feeding supplement (PRO-STAT SUGAR FREE 64)  30 mL Oral Daily  . furosemide  40 mg Oral Daily  . isosorbide mononitrate  90 mg Oral Daily  . latanoprost  1 drop Both Eyes QHS  . lidocaine (PF)      . magnesium oxide  400 mg Oral BID  . mycophenolate  180 mg Oral BID  . predniSONE  5 mg Oral Q breakfast  . ranolazine  500 mg Oral BID  . sodium bicarbonate  1,300 mg Oral BID  . tacrolimus  2 mg Oral BID  . Warfarin - Pharmacist Dosing Inpatient   Does not apply q1800   Continuous Infusions:   Roben Tatsch, MD, FACP, FHM. [facility] Pager [phone]  If 7PM-7AM, please contact night-coverage [email or web address] Password [facility] 03/21/2015, 4:42 PM   LOS: 7 days

## 2015-03-21 NOTE — Progress Notes (Signed)
    SUBJECTIVE:  Denies chest pain.    PHYSICAL EXAM Filed Vitals:   03/20/15 1700 03/20/15 2100 03/21/15 0500 03/21/15 0940  BP: 169/62 114/50 124/56 120/70  Pulse: 103 83 74 71  Temp: 99.8 F (37.7 C) 98.8 F (37.1 C) 98.2 F (36.8 C) 98.5 F (36.9 C)  TempSrc: Oral Oral Oral Oral  Resp: 20 20 20 20   Height:      Weight:      SpO2: 97% 95% 98% 98%   General:  No distress Lungs:  clear Heart:  RRR Abdomen:  Positive bowel sounds, no rebound no guarding Extremities:  No edema   LABS:  Results for orders placed or performed during the hospital encounter of 03/13/15 (from the past 24 hour(s))  Protime-INR     Status: Abnormal   Collection Time: 03/21/15  5:27 AM  Result Value Ref Range   Prothrombin Time 37.9 (H) 11.6 - 15.2 seconds   INR 3.99 (H) 0.00 - 1.49  Renal function panel     Status: Abnormal   Collection Time: 03/21/15  5:27 AM  Result Value Ref Range   Sodium 137 135 - 145 mmol/L   Potassium 4.8 3.5 - 5.1 mmol/L   Chloride 106 101 - 111 mmol/L   CO2 21 (L) 22 - 32 mmol/L   Glucose, Bld 135 (H) 65 - 99 mg/dL   BUN 53 (H) 6 - 20 mg/dL   Creatinine, Ser 2.25 (H) 0.44 - 1.00 mg/dL   Calcium 9.1 8.9 - 10.3 mg/dL   Phosphorus 2.5 2.5 - 4.6 mg/dL   Albumin 2.0 (L) 3.5 - 5.0 g/dL   GFR calc non Af Amer 22 (L) >60 mL/min   GFR calc Af Amer 25 (L) >60 mL/min   Anion gap 10 5 - 15  CBC     Status: Abnormal   Collection Time: 03/21/15  5:27 AM  Result Value Ref Range   WBC 7.9 4.0 - 10.5 K/uL   RBC 3.25 (L) 3.87 - 5.11 MIL/uL   Hemoglobin 8.8 (L) 12.0 - 15.0 g/dL   HCT 27.3 (L) 36.0 - 46.0 %   MCV 84.0 78.0 - 100.0 fL   MCH 27.1 26.0 - 34.0 pg   MCHC 32.2 30.0 - 36.0 g/dL   RDW 17.5 (H) 11.5 - 15.5 %   Platelets 125 (L) 150 - 400 K/uL    Intake/Output Summary (Last 24 hours) at 03/21/15 1148 Last data filed at 03/21/15 0910  Gross per 24 hour  Intake      0 ml  Output    650 ml  Net   -650 ml    ASSESSMENT AND PLAN:  HTN:  BP better controlled.   No change in therapy.    CHRONIC SYSTOLIC AND DIASTOLIC HF:   Home dose of Lasix restarted by Dr. Justin Mend.    CHEST PAIN:   No further chest pain.  No ischemia work up planned.  BACTEREMIA:  On for TEE tomorrow.  Discussed with the patient.  She has tolerated EDG in the past.   Debbie [name] 03/21/2015 11:48 AM

## 2015-03-21 NOTE — Progress Notes (Signed)
Patient will not have TEE until tom

## 2015-03-21 NOTE — Patient Outreach (Signed)
Chart review for disposition from acute care. Per EPIC, patient remains in acute care.    Plan: Telephone contact on Friday, March 3 for discharge plans

## 2015-03-21 NOTE — Progress Notes (Signed)
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     PT Comments    Pt making slow progress towards goals. Limited by pain in RUE and unable to maintain RLE NWBing status with transfers despite maximal cueing. Pt requiring +2 assist to come to standing yet unable to make forward progression due to L LE buckling without support from RLE. Pt eager to participate in seated exercises. Pt remains firm on her plan to return home although will continue to recommend SNF as pt is needing maximal-mod physical assist for mobility.  Follow Up Recommendations  SNF;Supervision/Assistance - 24 hour     Equipment Recommendations  None recommended by PT    Recommendations for Other Services       Precautions / Restrictions Precautions Precautions: Fall Required Braces or Orthoses:  (CAM boot on R) Restrictions Weight Bearing Restrictions: Yes RLE Weight Bearing: Non weight bearing    Mobility  Bed Mobility Overal bed mobility: Needs Assistance Bed Mobility: Supine to Sit;Sit to Supine     Supine to sit: Mod assist Sit to supine: Min assist   General bed mobility comments: Patient required increased time and physical assist to bring LEs to eob and elevate trunk to upright. Cues for positioning and UE support to pull to upright provided. Upon return to bed, patient required increased assist for bilateral LE elevation back to bed and rotation of trunk to reposition in bed. Patient required  assist to slide up in bed.  Transfers Overall transfer level: Needs assistance Equipment used: Rolling walker (2 wheeled) Transfers: Sit to/from Stand Sit to Stand: Mod assist;+2 physical assistance         General transfer comment: Utilized RW with cues for R NWB, difficulty maintaining NWBing in RLE. Required manual assist for RUE positioning on RW and increased physical assist to elevate to standing position at EOB with use of bed pad. Perfored x2, increased effort and fatigue noted. Pt unable to clear RLE from floor w/o RLE buckling   Ambulation/Gait                 Stairs            Wheelchair Mobility    Modified Rankin (Stroke Patients Only)       Balance Overall balance assessment: Needs assistance Sitting-balance support: No upper extremity supported;Feet supported Sitting balance-Leahy Scale: Fair Sitting balance - Comments: sitting EOb perfroming LE exercises without need for physical assist   Standing balance support: Bilateral upper extremity supported;During functional activity Standing balance-Leahy Scale: Zero Standing balance comment: reliance on RW and PT for physical assist                    Cognition Arousal/Alertness: Awake/alert Behavior During Therapy: WFL for tasks assessed/performed Overall Cognitive Status: Within Functional Limits for tasks assessed                      Exercises Other Exercises Other Exercises: lateral scoots at EOB, R and L, max VCs Other Exercises: LAQ at EOB against resistance  General Comments General comments (skin integrity, edema, etc.): swelling t/o R UE      Pertinent Vitals/Pain Pain Assessment: Faces Faces Pain Scale: Hurts even more Pain Location: R leg and shoulder Pain Descriptors / Indicators: Aching;Constant    Home Living                      Prior Function            PT Goals (current goals can now be found in the care plan section) Acute Rehab PT  Goals Patient Stated Goal: do somethign to get strong Progress towards PT goals: Progressing toward goals    Frequency  Min 2X/week    PT Plan Current plan remains appropriate    Co-evaluation             End of Session Equipment Utilized During Treatment: Gait belt;Other (comment) (R boot) Activity Tolerance: Patient limited by pain;Patient limited by fatigue Patient left: in bed;with call bell/phone within reach;with bed alarm set;with family/visitor present     Time: ID:[id number] PT Time Calculation (min) (ACUTE ONLY): 24 min  Charges:  $Therapeutic Exercise: 8-22 mins $Therapeutic Activity: 8-22 mins                    G Codes:      Ara Kussmaul 03/31/2015, 12:08 PM  Ara Kussmaul, Student Physical Therapist Acute Rehab [phone]

## 2015-03-21 NOTE — Care Management Important Message (Signed)
Important Message  Patient Details  Name: Debbie [name] MRN: [id number] Date of Birth: July 03, 1950   Medicare Important Message Given:  Yes    Araceli Coufal P Orpheus [place] 03/21/2015, 4:56 PM

## 2015-03-21 NOTE — Progress Notes (Signed)
ANTICOAGULATION CONSULT NOTE  Pharmacy Consult for Warfarin  Indication: atrial fibrillation  Patient Measurements: Height: 5\' 1"  (154.9 cm) Weight: 155 lb 10.3 oz (70.6 kg) IBW/kg (Calculated) : 47.8  Vital Signs: Temp: 98.5 F (36.9 C) (02/27 0940) Temp Source: Oral (02/27 0940) BP: 120/70 mmHg (02/27 0940) Pulse Rate: 71 (02/27 0940)  Labs:  Recent Labs  03/19/15 0553 03/19/15 0753 03/20/15 1115 03/21/15 0527  HGB 8.7*  --  9.7* 8.8*  HCT 27.0*  --  28.9* 27.3*  PLT 151  --  137* 125*  LABPROT >90.0* 29.3* 34.8* 37.9*  INR >10.00* 2.83* 3.56* 3.99*  CREATININE 2.34*  --  2.28* 2.25*    Estimated Creatinine Clearance: 22.4 mL/min (by C-G formula based on Cr of 2.25).   Assessment: 25 YOF s/p kidney transplant here with dizziness. She was recenetly discharged after episode of AKI and NSTEMI which was treated conservatively. She is on warfarin PTA for hypercoagulable state and hx DVT. INR was ~8 on 2/17 in the anti-coag clinic and instructions were to hold doses, admit INR was also high at 8.43.  Vitamin K 2.5mg  po x1 given 2/20, she was also transfused on 2/21.  INR was at goal and she received doses on 2/23, 2/24 and 2/25. She is receiving ampicillin and ceftriaxone IV which likely affected INR- now elevated at 3.99 (no dose given yesterday)   Hgb 8.8, Plts 125-  no bleeding noted. Received a dose of Aranesp subQ on Saturday, iron is contraindicated at this time given bacteremia.   Goal of Therapy:  INR 2-3 Monitor platelets by anticoagulation protocol: Yes   Plan:  -Hold warfarin tonight  -Daily PT/INR -Monitor for bleeding  Evlyn Amason D. Ishani Goldwasser, PharmD, BCPS Clinical Pharmacist Pager: [phone] 03/21/2015 11:49 AM

## 2015-03-21 NOTE — Progress Notes (Signed)
Subjective:  No new complaints   Antibiotics:  Anti-infectives    Start     Dose/Rate Route Frequency Ordered Stop   03/21/15 2200  ampicillin (OMNIPEN) 2 g in sodium chloride 0.9 % 50 mL IVPB     2 g 150 mL/hr over 20 Minutes Intravenous 3 times per day 03/21/15 1427     03/19/15 1315  cefTRIAXone (ROCEPHIN) 2 g in dextrose 5 % 50 mL IVPB     2 g 100 mL/hr over 30 Minutes Intravenous Every 12 hours 03/19/15 1313     03/18/15 1100  ampicillin (OMNIPEN) 2 g in sodium chloride 0.9 % 50 mL IVPB  Status:  Discontinued     2 g 150 mL/hr over 20 Minutes Intravenous Every 12 hours 03/18/15 0820 03/21/15 1427   03/17/15 1100  DAPTOmycin (CUBICIN) 575 mg in sodium chloride 0.9 % IVPB  Status:  Discontinued     575 mg 223 mL/hr over 30 Minutes Intravenous Every 48 hours 03/16/15 1122 03/18/15 0820   03/15/15 1100  DAPTOmycin (CUBICIN) 530 mg in sodium chloride 0.9 % IVPB  Status:  Discontinued     530 mg 221.2 mL/hr over 30 Minutes Intravenous Every 48 hours 03/15/15 0847 03/16/15 1122   03/14/15 2230  linezolid (ZYVOX) IVPB 600 mg  Status:  Discontinued     600 mg 300 mL/hr over 60 Minutes Intravenous Every 12 hours 03/14/15 2220 03/15/15 0830      Medications: Scheduled Meds: . allopurinol  200 mg Oral Daily  . amLODipine  5 mg Oral Daily  . ampicillin (OMNIPEN) IV  2 g Intravenous 3 times per day  . atorvastatin  40 mg Oral q1800  . calcitRIOL  0.25 mcg Oral QODAY  . calcitRIOL  0.5 mcg Oral QODAY  . carvedilol  25 mg Oral BID WC  . cefTRIAXone (ROCEPHIN)  IV  2 g Intravenous Q12H  . darbepoetin (ARANESP) injection - NON-DIALYSIS  100 mcg Subcutaneous Q Sat-1800  . famotidine  20 mg Oral Daily  . feeding supplement (NEPRO CARB STEADY)  237 mL Oral BID BM  . feeding supplement (PRO-STAT SUGAR FREE 64)  30 mL Oral Daily  . furosemide  40 mg Oral Daily  . isosorbide mononitrate  90 mg Oral Daily  . latanoprost  1 drop Both Eyes QHS  . lidocaine (PF)      . magnesium  oxide  400 mg Oral BID  . mycophenolate  180 mg Oral BID  . predniSONE  5 mg Oral Q breakfast  . ranolazine  500 mg Oral BID  . sodium bicarbonate  1,300 mg Oral BID  . tacrolimus  2 mg Oral BID  . Warfarin - Pharmacist Dosing Inpatient   Does not apply q1800   Continuous Infusions:   PRN Meds:.acetaminophen **OR** acetaminophen, fentaNYL (SUBLIMAZE) injection, magic mouthwash w/lidocaine, nitroGLYCERIN, ondansetron **OR** ondansetron (ZOFRAN) IV, oxyCODONE-acetaminophen    Objective: Weight change:   Intake/Output Summary (Last 24 hours) at 03/21/15 2055 Last data filed at 03/21/15 1839  Gross per 24 hour  Intake    360 ml  Output    651 ml  Net   -291 ml   Blood pressure 133/70, pulse 69, temperature 98.2 F (36.8 C), temperature source Oral, resp. rate 20, height 5\' 1"  (1.549 m), weight 155 lb 10.3 oz (70.6 kg), SpO2 98 %. Temp:  [98.2 F (36.8 C)-98.8 F (37.1 C)] 98.2 F (36.8 C) (02/27 1723) Pulse Rate:  [69-83] 69 (02/27  1723) Resp:  [20] 20 (02/27 1723) BP: (114-133)/(50-70) 133/70 mmHg (02/27 1723) SpO2:  [95 %-98 %] 98 % (02/27 1723)  Physical Exam: General: Alert and awake, oriented x3,  HEENT: anicteric sclera,  Cardiovascular: irr, irr rate, normal r, no murmur rubs or gallops Pulmonary: clear to auscultation bilaterally, no wheezing, rales or rhonchi Gastrointestinal: soft nontender, nondistended, normal bowel sounds, Musculoskeletal:right ankle in cast, right shoulder  is exquisitely tender to palpation  Skin, soft tissue: no rashes Neuro: nonfocal, strength and sensation intact  CBC:  CBC Latest Ref Rng 03/21/2015 03/20/2015 03/19/2015  WBC 4.0 - 10.5 K/uL 7.9 5.8 7.8  Hemoglobin 12.0 - 15.0 g/dL 8.8(L) 9.7(L) 8.7(L)  Hematocrit 36.0 - 46.0 % 27.3(L) 28.9(L) 27.0(L)  Platelets 150 - 400 K/uL 125(L) 137(L) 151      BMET  Recent Labs  03/20/15 1115 03/21/15 0527  NA 139 137  K 4.8 4.8  CL 105 106  CO2 23 21*  GLUCOSE 118* 135*  BUN 54*  53*  CREATININE 2.28* 2.25*  CALCIUM 9.8 9.1     Liver Panel   Recent Labs  03/20/15 1115 03/21/15 0527  ALBUMIN 2.0* 2.0*       Sedimentation Rate No results for input(s): ESRSEDRATE in the last 72 hours. C-Reactive Protein No results for input(s): CRP in the last 72 hours.  Micro Results: Recent Results (from the past 720 hour(s))  Blood culture (routine x 2)     Status: None   Collection Time: 03/14/15  1:15 AM  Result Value Ref Range Status   Specimen Description BLOOD BLOOD LEFT FOREARM  Final   Special Requests BOTTLES DRAWN AEROBIC AND ANAEROBIC 5CC  Final   Culture  Setup Time   Final    GRAM POSITIVE COCCI IN CLUSTERS IN BOTH AEROBIC AND ANAEROBIC BOTTLES CRITICAL RESULT CALLED TO, READ BACK BY AND VERIFIED WITH: Vivianne Spence RN 2017 03/14/15 A BROWNING    Culture   Final    ENTEROCOCCUS SPECIES SUSCEPTIBILITIES PERFORMED ON PREVIOUS CULTURE WITHIN THE LAST 5 DAYS. STAPHYLOCOCCUS SPECIES (COAGULASE NEGATIVE) THE SIGNIFICANCE OF ISOLATING THIS ORGANISM FROM A SINGLE SET OF BLOOD CULTURES WHEN MULTIPLE SETS ARE DRAWN IS UNCERTAIN. PLEASE NOTIFY THE MICROBIOLOGY DEPARTMENT WITHIN ONE WEEK IF SPECIATION AND SENSITIVITIES ARE REQUIRED.    Report Status 03/17/2015 FINAL  Final  Blood culture (routine x 2)     Status: None   Collection Time: 03/14/15  1:33 AM  Result Value Ref Range Status   Specimen Description BLOOD RIGHT WRIST  Final   Special Requests BOTTLES DRAWN AEROBIC AND ANAEROBIC 5CC   Final   Culture  Setup Time   Final    GRAM POSITIVE COCCI IN CHAINS IN BOTH AEROBIC AND ANAEROBIC BOTTLES CRITICAL RESULT CALLED TO, READ BACK BY AND VERIFIED WITH: K MOORE,RN @0144  03/15/15 MKELLY    Culture ENTEROCOCCUS SPECIES  Final   Report Status 03/17/2015 FINAL  Final   Organism ID, Bacteria ENTEROCOCCUS SPECIES  Final      Susceptibility   Enterococcus species - MIC*    AMPICILLIN <=2 SENSITIVE Sensitive     VANCOMYCIN 1 SENSITIVE Sensitive     GENTAMICIN  SYNERGY SENSITIVE Sensitive     * ENTEROCOCCUS SPECIES  Culture, blood (Routine X 2) w Reflex to ID Panel     Status: None   Collection Time: 03/16/15  5:00 AM  Result Value Ref Range Status   Specimen Description BLOOD LEFT HAND  Final   Special Requests AEROBIC BOTTLE ONLY 5ML  Final   Culture  Setup Time   Final    GRAM POSITIVE COCCI IN PAIRS IN CHAINS AEROBIC BOTTLE ONLY CRITICAL RESULT CALLED TO, READ BACK BY AND VERIFIED WITH: A. LOWE,RN AT [id number] ON [id number] BY Rhea Bleacher    Culture   Final    ENTEROCOCCUS SPECIES SUSCEPTIBILITIES PERFORMED ON PREVIOUS CULTURE WITHIN THE LAST 5 DAYS.    Report Status 03/19/2015 FINAL  Final  Culture, blood (Routine X 2) w Reflex to ID Panel     Status: None (Preliminary result)   Collection Time: 03/16/15  5:04 AM  Result Value Ref Range Status   Specimen Description BLOOD LEFT FINGER  Final   Special Requests IN PEDIATRIC BOTTLE  Final   Culture  Setup Time   Final    GRAM POSITIVE COCCI IN CLUSTERS IN PAIRS AEROBIC BOTTLE ONLY CRITICAL RESULT CALLED TO, READ BACK BY AND VERIFIED WITHBarnabas Harries RN 2321 03/18/15 A BROWNING    Culture   Final    GRAM POSITIVE COCCI CULTURE REINCUBATED FOR BETTER GROWTH    Report Status PENDING  Incomplete  Culture, blood (Routine X 2) w Reflex to ID Panel     Status: None (Preliminary result)   Collection Time: 03/18/15  8:54 AM  Result Value Ref Range Status   Specimen Description BLOOD BLOOD LEFT HAND  Final   Special Requests IN PEDIATRIC BOTTLE 2.5CC  Final   Culture NO GROWTH 3 DAYS  Final   Report Status PENDING  Incomplete  Culture, blood (Routine X 2) w Reflex to ID Panel     Status: None (Preliminary result)   Collection Time: 03/18/15  9:01 AM  Result Value Ref Range Status   Specimen Description BLOOD BLOOD LEFT HAND  Final   Special Requests IN PEDIATRIC BOTTLE 1CC  Final   Culture NO GROWTH 3 DAYS  Final   Report Status PENDING  Incomplete  Body fluid culture     Status: None  (Preliminary result)   Collection Time: 03/21/15  2:00 PM  Result Value Ref Range Status   Specimen Description FLUID SYNOVIAL RIGHT SHOULDER  Final   Special Requests NONE  Final   Gram Stain   Final    ABUNDANT WBC PRESENT, PREDOMINANTLY PMN NO ORGANISMS SEEN    Culture PENDING  Incomplete   Report Status PENDING  Incomplete    Studies/Results: Dg Fluoro Guided Needle Plc Aspiration/injection Loc  03/21/2015  CLINICAL DATA:  Sinusitis, joint effusion and bursitis involving the right shoulder on CT 03/17/2015. EXAM: RIGHT SHOULDER ASPIRATION UNDER FLUOROSCOPY TECHNIQUE: Despite an INR of 3.99, Dr. Meridee Score insisted on a relatively emergent right shoulder aspiration due to the possibility of sepsis. The increased risk of bleeding was discussed with the patient prior to the procedure, as well as the risk of infection. Patient elected to proceed with the procedure. Informed consent was obtained. An appropriate skin entrance site was determined. The site was marked, prepped with Betadine, draped in the usual sterile fashion, and infiltrated locally with buffered Lidocaine. Based on CT imaging, 80 20 gauge spinal needle was inserted over the mid humeral head and 5-60 cc of serosanguineous fluid returned. No immediate complication. FLUOROSCOPY TIME:  Radiation Exposure Index (as provided by the fluoroscopic device): If the device does not provide the exposure index: Fluoroscopy Time (in minutes and seconds):  0 minutes 24 seconds. Number of Acquired Images:  None. FINDINGS: Despite an INR of 3.99, Dr. Meridee Score insisted on a relatively emergent right shoulder aspiration  due to the possibility of sepsis. The increased risk of bleeding was discussed with the patient prior to the procedure, as well as the risk of infection. Patient elected to proceed with the procedure. Informed consent was obtained. An appropriate skin entrance site was determined. The site was marked, prepped with Betadine, draped in  the usual sterile fashion, and infiltrated locally with buffered Lidocaine. Based on CT imaging, a 20 gauge spinal needle was inserted over the mid humeral head with return of 5-6 cc of serosanguineous fluid. No immediate complication. IMPRESSION: Technically successful right shoulder aspiration. Electronically Signed   By: Lorin Picket M.D.   On: 03/21/2015 15:16      Assessment/Plan:  INTERVAL HISTORY:  03/17/15: now BOTH cultures are growing Enterococcus 03/18/15: repeat cultures also + 1/2 , and CT showing large effusion right shoulder   Principal Problem:   Acute renal failure superimposed on stage 4 chronic kidney disease ([place]) Active Problems:   CKD (chronic kidney disease), stage IV ([facility])   HTN (hypertension)   Chronic anticoagulation - with Coumadin   Dyslipidemia   Pulmonary hypertension ([place])   NSTEMI- Jan 2017- medical Rx   Renal transplant recipient   Coagulopathy ([place])   Cardiomyopathy, ischemic-40-45%   Supratherapeutic INR   LBBB (left bundle branch block)   Gram-positive bacteremia   Enterococcal bacteremia   Bacteremia   Arm pain, right   Pyogenic arthritis of right shoulder region ([place])    Debbie [name] is a 65 y.o. female with  65 y.o. female with renal transplant on immunosuppressive drugs with recent admission for NSTEMI now admitted with dizziness, weakness and fevers and found to have Enterococcus  2/2 blood cultures, with perstently positive blood cultures and likely septic shoulder  #1 Enterococcal bacteremia:   She NEEDS  TEE   Continue AMP and Ceftriaxone 2 g IV q 12 hours  Repeat cultures are NG to date finally but only 24 days old  #2 Right shoulder, arm and forearm pain: CT shows large effusion concerning for septic shoulder esp given onset 2 weeks ago and bacteremia  --Greatly appreciate Orthopedics and IR   --PLEASE SEND FLUID FOR   #1 CELL COUNT AND DIFFERENTIAL (FIRST PRIORITY) #2 CULTURE #3 CRYSTALS  Given that she has been on  antibiotics it will not be surprising if culture is negative which is why the [place] is so important to diagnosis here  She is going to need surgery if this is indeed septic joint which seems by far the most likely diagnosis  I spent greater than 35 minutes with the patient including greater than 50% of time in face to face counsel of the patient guarding her enterococcal bacteremia her likely septic right shoulder her kidney transplant and in coordination of her care.      LOS: 7 days   Alcide Evener 03/21/2015, 8:55 PM

## 2015-03-21 NOTE — Progress Notes (Signed)
[facility] ROUNDING NOTE   Subjective:   Interval History: no issues   Objective:  Vital signs in last 24 hours:  Temp:  [98.2 F (36.8 C)-99.8 F (37.7 C)] 98.2 F (36.8 C) (02/27 0500) Pulse Rate:  [74-103] 74 (02/27 0500) Resp:  [18-20] 20 (02/27 0500) BP: (114-169)/(50-62) 124/56 mmHg (02/27 0500) SpO2:  [95 %-99 %] 98 % (02/27 0500)  Weight change:  Filed Weights   03/15/15 2045 03/16/15 2128 03/17/15 2025  Weight: 70.308 kg (155 lb) 70.5 kg (155 lb 6.8 oz) 70.6 kg (155 lb 10.3 oz)    Intake/Output: I/O last 3 completed shifts: In: -  Out: 1100 [Urine:1100]   Intake/Output this shift:     General: NAD- maybe confused-  Heart: RRR Lungs: mostly clear Abdomen: obese, soft, non tender Extremities: pitting edema Dialysis Access: patent right AVF with edema as well    Basic Metabolic Panel:  Recent Labs Lab 03/17/15 0703 03/18/15 0530 03/19/15 0553 03/20/15 1115 03/21/15 0527  NA 136 134* 138 139 137  K 4.3 4.7 4.8 4.8 4.8  CL 107 103 106 105 106  CO2 22 21* 22 23 21*  GLUCOSE 124* 182* 151* 118* 135*  BUN 69* 58* 59* 54* 53*  CREATININE 3.41* 2.69* 2.34* 2.28* 2.25*  CALCIUM 9.1 9.6 9.4 9.8 9.1  PHOS  --  3.4 2.5 2.3* 2.5    Liver Function Tests:  Recent Labs Lab 03/18/15 0530 03/19/15 0553 03/20/15 1115 03/21/15 0527  ALBUMIN 2.2* 1.9* 2.0* 2.0*   No results for input(s): LIPASE, AMYLASE in the last 168 hours. No results for input(s): AMMONIA in the last 168 hours.  CBC:  Recent Labs Lab 03/16/15 0509 03/17/15 0703 03/19/15 0553 03/20/15 1115 03/21/15 0527  WBC 9.4 6.4 7.8 5.8 7.9  HGB 8.9* 9.1* 8.7* 9.7* 8.8*  HCT 27.6* 27.3* 27.0* 28.9* 27.3*  MCV 83.4 85.8 84.9 85.0 84.0  PLT 173 155 151 137* 125*    Cardiac Enzymes:  Recent Labs Lab 03/14/15 1139 03/14/15 2139 03/15/15 0906 03/17/15 0703 03/17/15 1211 03/17/15 1819  CKTOTAL  --   --  29*  --   --   --   TROPONINI 0.05* 0.05*  --  0.04* 0.04*  0.08*    BNP: Invalid input(s): POCBNP  CBG: No results for input(s): GLUCAP in the last 168 hours.  Microbiology: Results for orders placed or performed during the hospital encounter of 03/13/15  Blood culture (routine x 2)     Status: None   Collection Time: 03/14/15  1:15 AM  Result Value Ref Range Status   Specimen Description BLOOD BLOOD LEFT FOREARM  Final   Special Requests BOTTLES DRAWN AEROBIC AND ANAEROBIC 5CC  Final   Culture  Setup Time   Final    GRAM POSITIVE COCCI IN CLUSTERS IN BOTH AEROBIC AND ANAEROBIC BOTTLES CRITICAL RESULT CALLED TO, READ BACK BY AND VERIFIED WITH: Vivianne Spence RN 2017 03/14/15 A BROWNING    Culture   Final    ENTEROCOCCUS SPECIES SUSCEPTIBILITIES PERFORMED ON PREVIOUS CULTURE WITHIN THE LAST 5 DAYS. STAPHYLOCOCCUS SPECIES (COAGULASE NEGATIVE) THE SIGNIFICANCE OF ISOLATING THIS ORGANISM FROM A SINGLE SET OF BLOOD CULTURES WHEN MULTIPLE SETS ARE DRAWN IS UNCERTAIN. PLEASE NOTIFY THE MICROBIOLOGY DEPARTMENT WITHIN ONE WEEK IF SPECIATION AND SENSITIVITIES ARE REQUIRED.    Report Status 03/17/2015 FINAL  Final  Blood culture (routine x 2)     Status: None   Collection Time: 03/14/15  1:33 AM  Result Value Ref Range Status  Specimen Description BLOOD RIGHT WRIST  Final   Special Requests BOTTLES DRAWN AEROBIC AND ANAEROBIC 5CC   Final   Culture  Setup Time   Final    GRAM POSITIVE COCCI IN CHAINS IN BOTH AEROBIC AND ANAEROBIC BOTTLES CRITICAL RESULT CALLED TO, READ BACK BY AND VERIFIED WITH: K MOORE,RN @0144  03/15/15 MKELLY    Culture ENTEROCOCCUS SPECIES  Final   Report Status 03/17/2015 FINAL  Final   Organism ID, Bacteria ENTEROCOCCUS SPECIES  Final      Susceptibility   Enterococcus species - MIC*    AMPICILLIN <=2 SENSITIVE Sensitive     VANCOMYCIN 1 SENSITIVE Sensitive     GENTAMICIN SYNERGY SENSITIVE Sensitive     * ENTEROCOCCUS SPECIES  Culture, blood (Routine X 2) w Reflex to ID Panel     Status: None   Collection Time: 03/16/15   5:00 AM  Result Value Ref Range Status   Specimen Description BLOOD LEFT HAND  Final   Special Requests AEROBIC BOTTLE ONLY 5ML  Final   Culture  Setup Time   Final    GRAM POSITIVE COCCI IN PAIRS IN CHAINS AEROBIC BOTTLE ONLY CRITICAL RESULT CALLED TO, READ BACK BY AND VERIFIED WITH: A. LOWE,RN AT [id number] ON [id number] BY Rhea Bleacher    Culture   Final    ENTEROCOCCUS SPECIES SUSCEPTIBILITIES PERFORMED ON PREVIOUS CULTURE WITHIN THE LAST 5 DAYS.    Report Status 03/19/2015 FINAL  Final  Culture, blood (Routine X 2) w Reflex to ID Panel     Status: None (Preliminary result)   Collection Time: 03/16/15  5:04 AM  Result Value Ref Range Status   Specimen Description BLOOD LEFT FINGER  Final   Special Requests IN PEDIATRIC BOTTLE  Final   Culture  Setup Time   Final    GRAM POSITIVE COCCI IN CLUSTERS IN PAIRS AEROBIC BOTTLE ONLY CRITICAL RESULT CALLED TO, READ BACK BY AND VERIFIED WITHBarnabas Harries RN [id number] 03/18/15 A BROWNING    Culture GRAM POSITIVE COCCI  Final   Report Status PENDING  Incomplete  Culture, blood (Routine X 2) w Reflex to ID Panel     Status: None (Preliminary result)   Collection Time: 03/18/15  8:54 AM  Result Value Ref Range Status   Specimen Description BLOOD BLOOD LEFT HAND  Final   Special Requests IN PEDIATRIC BOTTLE 2.5CC  Final   Culture NO GROWTH 2 DAYS  Final   Report Status PENDING  Incomplete  Culture, blood (Routine X 2) w Reflex to ID Panel     Status: None (Preliminary result)   Collection Time: 03/18/15  9:01 AM  Result Value Ref Range Status   Specimen Description BLOOD BLOOD LEFT HAND  Final   Special Requests IN PEDIATRIC BOTTLE 1CC  Final   Culture NO GROWTH 2 DAYS  Final   Report Status PENDING  Incomplete    Coagulation Studies:  Recent Labs  03/19/15 0553 03/19/15 0753 03/20/15 1115 03/21/15 0527  LABPROT >90.0* 29.3* 34.8* 37.9*  INR >10.00* 2.83* 3.56* 3.99*    Urinalysis: No results for input(s): COLORURINE, LABSPEC, PHURINE,  GLUCOSEU, HGBUR, BILIRUBINUR, KETONESUR, PROTEINUR, UROBILINOGEN, NITRITE, LEUKOCYTESUR in the last 72 hours.  Invalid input(s): APPERANCEUR    Imaging: No results found.   Medications:     . allopurinol  200 mg Oral Daily  . amLODipine  5 mg Oral Daily  . ampicillin (OMNIPEN) IV  2 g Intravenous Q12H  . atorvastatin  40 mg Oral q1800  . calcitRIOL  0.25 mcg Oral QODAY  . calcitRIOL  0.5 mcg Oral QODAY  . carvedilol  25 mg Oral BID WC  . cefTRIAXone (ROCEPHIN)  IV  2 g Intravenous Q12H  . darbepoetin (ARANESP) injection - NON-DIALYSIS  100 mcg Subcutaneous Q Sat-1800  . famotidine  20 mg Oral Daily  . feeding supplement (NEPRO CARB STEADY)  237 mL Oral BID BM  . feeding supplement (PRO-STAT SUGAR FREE 64)  30 mL Oral Daily  . furosemide  40 mg Oral Daily  . isosorbide mononitrate  90 mg Oral Daily  . latanoprost  1 drop Both Eyes QHS  . magnesium oxide  400 mg Oral BID  . mycophenolate  180 mg Oral BID  . predniSONE  5 mg Oral Q breakfast  . ranolazine  500 mg Oral BID  . sodium bicarbonate  1,300 mg Oral BID  . tacrolimus  2 mg Oral BID  . Warfarin - Pharmacist Dosing Inpatient   Does not apply q1800   acetaminophen **OR** acetaminophen, fentaNYL (SUBLIMAZE) injection, magic mouthwash w/lidocaine, nitroGLYCERIN, ondansetron **OR** ondansetron (ZOFRAN) IV, oxyCODONE-acetaminophen  Assessment/ Plan:  Pt is a 65 y.o. yo female with s/p renal transplant, antiphospholipid syndrome, Afib and DVT on coumadin who was admitted on 03/13/2015 with A on CRF in the setting of hypotension- has resolved but now discovery of enterococcal bacteremia  Assessment/Plan: 1. Renal- s/p transplant with allograft nephropathy- baseline creatinine in the mid 2's- renal function is back to below her baseline although is a little volume overloaded as well so may be diluted- no more IVF 2. Immune suppression/enterococcal bacteremia- on prograf - level was 9.4- about 7 hours after dosing so not true  trough- and myfortic as well as pred 5- now has a bacteremia in her immune compromised state- ID involved changed her over to ampicillin- and now ceftriaxone as well- want TEE but not sure pt will agree- ortho does not feel she has septic joint-  3. CAD- not a cardiac cath candidate due to kidney transplant and allograft nephropathy  4. HTN/volume- now overloaded- no more IVF - on norvasc- resumed today and coreg - will add back home dose of lasix 5. Anemia- iron contraindicated- will give ESA for hgb less than 10 6. Right arm swelling: differential diagnosis include deep venous thrombosis and central vein stenosis-she has a right upper arm arteriovenous graft and appears to have prominent external jugular vein on the right side as well as some prominent veins on her right upper chest favoring the latter. She is already on anticoagulation with Coumadin so evaluation for DVT would just be an academic exercise to explain swelling. At some point after she has sufficient renal recovery, she may need central venogram with angioplasty if she has stenosis to alleviate some of her swelling Will sign off today   LOS: 7 Izaia Say W @TODAY @9 :37 AM

## 2015-03-22 ENCOUNTER — Ambulatory Visit: Payer: Medicare Other | Admitting: Cardiology

## 2015-03-22 ENCOUNTER — Other Ambulatory Visit: Payer: Medicare Other

## 2015-03-22 ENCOUNTER — Inpatient Hospital Stay (HOSPITAL_COMMUNITY): Payer: Medicare Other

## 2015-03-22 ENCOUNTER — Encounter (HOSPITAL_COMMUNITY): Payer: Self-pay

## 2015-03-22 ENCOUNTER — Encounter (HOSPITAL_COMMUNITY)
Admission: EM | Disposition: A | Discharge status: Skilled Nursing Facility | Payer: Self-pay | Source: Home / Self Care | Attending: Internal Medicine

## 2015-03-22 DIAGNOSIS — I340 Nonrheumatic mitral (valve) insufficiency: Secondary | ICD-10-CM

## 2015-03-22 HISTORY — PX: TEE WITHOUT CARDIOVERSION: SHX5443

## 2015-03-22 LAB — RENAL FUNCTION PANEL
Albumin: 2.1 g/dL — ABNORMAL LOW (ref 3.5–5.0)
Anion gap: 13 (ref 5–15)
BUN: 46 mg/dL — AB (ref 6–20)
CHLORIDE: 103 mmol/L (ref 101–111)
CO2: 23 mmol/L (ref 22–32)
Calcium: 9.8 mg/dL (ref 8.9–10.3)
Creatinine, Ser: 2.34 mg/dL — ABNORMAL HIGH (ref 0.44–1.00)
GFR calc Af Amer: 24 mL/min — ABNORMAL LOW (ref >60)
GFR calc non Af Amer: 21 mL/min — ABNORMAL LOW (ref >60)
GLUCOSE: 146 mg/dL — AB (ref 65–99)
POTASSIUM: 5 mmol/L (ref 3.5–5.1)
Phosphorus: 2.4 mg/dL — ABNORMAL LOW (ref 2.5–4.6)
Sodium: 139 mmol/L (ref 135–145)

## 2015-03-22 LAB — CBC WITH DIFFERENTIAL/PLATELET
BASOS PCT: 0 %
Basophils Absolute: 0 10*3/uL (ref 0.0–0.1)
EOS PCT: 1 %
Eosinophils Absolute: 0.1 10*3/uL (ref 0.0–0.7)
HCT: 28 % — ABNORMAL LOW (ref 36.0–46.0)
Hemoglobin: 8.8 g/dL — ABNORMAL LOW (ref 12.0–15.0)
LYMPHS ABS: 1.4 10*3/uL (ref 0.7–4.0)
Lymphocytes Relative: 18 %
MCH: 26.6 pg (ref 26.0–34.0)
MCHC: 31.4 g/dL (ref 30.0–36.0)
MCV: 84.6 fL (ref 78.0–100.0)
MONOS PCT: 5 %
Monocytes Absolute: 0.4 10*3/uL (ref 0.1–1.0)
Neutro Abs: 5.9 10*3/uL (ref 1.7–7.7)
Neutrophils Relative %: 76 %
PLATELETS: 136 10*3/uL — AB (ref 150–400)
RBC: 3.31 MIL/uL — ABNORMAL LOW (ref 3.87–5.11)
RDW: 17.7 % — ABNORMAL HIGH (ref 11.5–15.5)
WBC: 7.8 10*3/uL (ref 4.0–10.5)

## 2015-03-22 LAB — PROTIME-INR
INR: 3.97 — ABNORMAL HIGH (ref 0.00–1.49)
PROTHROMBIN TIME: 37.8 s — AB (ref 11.6–15.2)

## 2015-03-22 LAB — CBC
HEMATOCRIT: 28.9 % — AB (ref 36.0–46.0)
Hemoglobin: 9.5 g/dL — ABNORMAL LOW (ref 12.0–15.0)
MCH: 28 pg (ref 26.0–34.0)
MCHC: 32.9 g/dL (ref 30.0–36.0)
MCV: 85.3 fL (ref 78.0–100.0)
Platelets: 147 10*3/uL — ABNORMAL LOW (ref 150–400)
RBC: 3.39 MIL/uL — ABNORMAL LOW (ref 3.87–5.11)
RDW: 17.8 % — AB (ref 11.5–15.5)
WBC: 7.6 10*3/uL (ref 4.0–10.5)

## 2015-03-22 SURGERY — ECHOCARDIOGRAM, TRANSESOPHAGEAL
Anesthesia: Moderate Sedation

## 2015-03-22 MED ORDER — SODIUM CHLORIDE 0.9 % IV SOLN: INTRAVENOUS | Status: DC

## 2015-03-22 MED ORDER — MIDAZOLAM HCL 5 MG/ML IJ SOLN
INTRAMUSCULAR | Status: AC
  Filled 2015-03-22: qty 2

## 2015-03-22 MED ORDER — FENTANYL CITRATE (PF) 100 MCG/2ML IJ SOLN
INTRAMUSCULAR | Status: DC | PRN
  Administered 2015-03-22: 25 ug via INTRAVENOUS

## 2015-03-22 MED ORDER — BUTAMBEN-TETRACAINE-BENZOCAINE 2-2-14 % EX AERO
INHALATION_SPRAY | CUTANEOUS | Status: DC | PRN
  Administered 2015-03-22: 2 via TOPICAL

## 2015-03-22 MED ORDER — FENTANYL CITRATE (PF) 100 MCG/2ML IJ SOLN
INTRAMUSCULAR | Status: AC
  Filled 2015-03-22: qty 2

## 2015-03-22 MED ORDER — MIDAZOLAM HCL 10 MG/2ML IJ SOLN
INTRAMUSCULAR | Status: DC | PRN
  Administered 2015-03-22: 1 mg via INTRAVENOUS
  Administered 2015-03-22 (×2): 2 mg via INTRAVENOUS

## 2015-03-22 NOTE — Interval H&P Note (Signed)
History and Physical Interval Note:  03/22/2015 7:07 AM  Debbie [name]  has presented today for surgery, with the diagnosis of BACTEREMIA  The various methods of treatment have been discussed with the patient and family. After consideration of risks, benefits and other options for treatment, the patient has consented to  Procedure(s): TRANSESOPHAGEAL ECHOCARDIOGRAM (TEE) (N/A) as a surgical intervention .  The patient's history has been reviewed, patient examined, no change in status, stable for surgery.  I have reviewed the patient's chart and labs.  Questions were answered to the patient's satisfaction.     Jenkins Rouge

## 2015-03-22 NOTE — Progress Notes (Signed)
[facility] [place] for Warfarin  Indication: atrial fibrillation  Patient Measurements: Height: 5\' 1"  (154.9 cm) Weight: 155 lb 10.3 oz (70.6 kg) IBW/kg (Calculated) : 47.8  Vital Signs: Temp: 98.7 F (37.1 C) (02/28 1000) Temp Source: Oral (02/28 1000) BP: 147/60 mmHg (02/28 1000) Pulse Rate: 80 (02/28 1000)  Labs:  Recent Labs  03/20/15 1115 03/21/15 0527 03/22/15 1416  HGB 9.7* 8.8* 9.5*  HCT 28.9* 27.3* 28.9*  PLT 137* 125* 147*  LABPROT 34.8* 37.9* 37.8*  INR 3.56* 3.99* 3.97*  CREATININE 2.28* 2.25* 2.34*    Estimated Creatinine Clearance: 21.5 mL/min (by C-G formula based on Cr of 2.34).   Assessment: 40 YOF s/p kidney transplant here with dizziness. She was recenetly discharged after episode of AKI and NSTEMI which was treated conservatively. She is on warfarin PTA for hypercoagulable state and hx DVT. INR was ~8 on 2/17 in the anti-coag clinic and instructions were to hold doses, admit INR was also high at 8.43.  Vitamin K 2.5mg  po x1 given 2/20, she was also transfused on 2/21.  INR was at goal and she received doses on 2/23, 2/24 and 2/25. INR is now elevated- today 3.97.  Hgb 9.5, Plts 147-  no bleeding noted. Received a dose of Aranesp subQ on Saturday 2/25, iron is contraindicated at this time given bacteremia.   Goal of Therapy:  INR 2-3 Monitor platelets by anticoagulation protocol: Yes   Plan:  -Hold warfarin tonight  -Daily PT/INR -Monitor for bleeding  Debbie [name] D. Nimo Verastegui, PharmD, BCPS Clinical Pharmacist Pager: [phone] 03/22/2015 3:07 PM

## 2015-03-22 NOTE — H&P (View-Only) (Signed)
    SUBJECTIVE:  Denies chest pain.    PHYSICAL EXAM Filed Vitals:   03/20/15 1700 03/20/15 2100 03/21/15 0500 03/21/15 0940  BP: 169/62 114/50 124/56 120/70  Pulse: 103 83 74 71  Temp: 99.8 F (37.7 C) 98.8 F (37.1 C) 98.2 F (36.8 C) 98.5 F (36.9 C)  TempSrc: Oral Oral Oral Oral  Resp: 20 20 20 20   Height:      Weight:      SpO2: 97% 95% 98% 98%   General:  No distress Lungs:  clear Heart:  RRR Abdomen:  Positive bowel sounds, no rebound no guarding Extremities:  No edema   LABS:  Results for orders placed or performed during the hospital encounter of 03/13/15 (from the past 24 hour(s))  Protime-INR     Status: Abnormal   Collection Time: 03/21/15  5:27 AM  Result Value Ref Range   Prothrombin Time 37.9 (H) 11.6 - 15.2 seconds   INR 3.99 (H) 0.00 - 1.49  Renal function panel     Status: Abnormal   Collection Time: 03/21/15  5:27 AM  Result Value Ref Range   Sodium 137 135 - 145 mmol/L   Potassium 4.8 3.5 - 5.1 mmol/L   Chloride 106 101 - 111 mmol/L   CO2 21 (L) 22 - 32 mmol/L   Glucose, Bld 135 (H) 65 - 99 mg/dL   BUN 53 (H) 6 - 20 mg/dL   Creatinine, Ser 2.25 (H) 0.44 - 1.00 mg/dL   Calcium 9.1 8.9 - 10.3 mg/dL   Phosphorus 2.5 2.5 - 4.6 mg/dL   Albumin 2.0 (L) 3.5 - 5.0 g/dL   GFR calc non Af Amer 22 (L) >60 mL/min   GFR calc Af Amer 25 (L) >60 mL/min   Anion gap 10 5 - 15  CBC     Status: Abnormal   Collection Time: 03/21/15  5:27 AM  Result Value Ref Range   WBC 7.9 4.0 - 10.5 K/uL   RBC 3.25 (L) 3.87 - 5.11 MIL/uL   Hemoglobin 8.8 (L) 12.0 - 15.0 g/dL   HCT 27.3 (L) 36.0 - 46.0 %   MCV 84.0 78.0 - 100.0 fL   MCH 27.1 26.0 - 34.0 pg   MCHC 32.2 30.0 - 36.0 g/dL   RDW 17.5 (H) 11.5 - 15.5 %   Platelets 125 (L) 150 - 400 K/uL    Intake/Output Summary (Last 24 hours) at 03/21/15 1148 Last data filed at 03/21/15 0910  Gross per 24 hour  Intake      0 ml  Output    650 ml  Net   -650 ml    ASSESSMENT AND PLAN:  HTN:  BP better controlled.   No change in therapy.    CHRONIC SYSTOLIC AND DIASTOLIC HF:   Home dose of Lasix restarted by Dr. Justin Mend.    CHEST PAIN:   No further chest pain.  No ischemia work up planned.  BACTEREMIA:  On for TEE tomorrow.  Discussed with the patient.  She has tolerated EDG in the past.   Debbie [name] 03/21/2015 11:48 AM

## 2015-03-22 NOTE — Progress Notes (Addendum)
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 Jean Rosenthal) presented to [facility] with 1 day history of dizziness and generalized weakness. The patient was recently discharged from the hospital on 02/21/2015 after treatment for NSTEMI and acute on chronic renal failure. The patient states that she has had poor by mouth intake for the past 2-3 days with constipation. Upon presentation, she was noted to have a serum creatinine of 4.31. The patient received 2 L bolus of fluid in the emergency department. She was also found to have INR of 8.14. On the afternoon of 03/14/2015, the patient developed substernal chest pain for which she received nitroglycerin with relief. Cardiology was consulted. EKG showed a new LBBB.  Assessment/Plan: Acute on chronic renal failure (CKD 4) in pt with renal transplant -due to volume depletion and infection -restarted judicious IVF -baseline creatinine 2.2-2.3 -Patient endorses compliance with all her medications including immunosuppressive therapy -renal ultrasound: Native kidneys not identified possibly due to atrophy. Transplanted kidney without hydronephrosis. - Creatinine was 2.2 on 1/30. Admitted with creatinine of 4.31. Acute renal failure resolved. Creatinine back to baseline. - Nephrology follow-up appreciated.  Mild hyperkalemia - Resolved after  a dose of Kayexalate.  Enterococcus bacteremia - source unclear. 2/20: 1/2 blood cultures positive for enterococcus. 2nd blood culture positive for enterococcus and Coag neg staphylococcus. 2/22: Blood culture 2: Also positive - pt has anaphylaxis to vancomycin - Cubicin changed to IV ampicillin & Rocephin-duration to be determined by ID. Cannot get PICC line due to chronic kidney disease. May need tunneled central line by IR (timing to be determined by ID). Surveillance blood culture(2/24)  neg to date.  - CT right upper extremity shows right shoulder effusion. - Orthopedics follow-up appreciated. Did not feel that she had acute gout in her right shoulder. They had IR aspirate right shoulder-Results pending (culture negative to date. No results seen for synovitis or fluid cell count or crystals-requested today) . - TEE 2/28: No vegetation seen.   Chest pain and new LBBB on EKG -Concerning for angina with new left bundle branch block -relieved after SL NTG x 1 -EKG--new LBBB -pt had NSTEMI last admission in Jan 2017 -02/15/2015 echo EF 40-45%, PAP 60 -elevated troponins likely due to renal failure - Cardiology follow-up appreciated: Suspect anemia contribute it to her angina. Continue carvedilol, Lipitor, Imdur and Ranexa. Cardiology does not recommend any further workup. - Cardiology has increased dose of Imdur and beta blocker.  - No ischemia workup planned.  Right upper extremity pain and swelling - Reported on 2/23. Right upper extremity distal to elbow does appear diffusely swollen but not warm and is also tender without other features of cellulitis or arthritis. Good radial pulsation felt. - Right upper extremity venous Doppler negative for DVT.  - As per nephrology, she may need further evaluation for central vein stenosis-has right upper extremity AV graft. - CT shows right shoulder effusion.  - Orthopedics/Dr. Sharol Given evaluated and was not convinced that this  was gout flare. He had  interventional radiology aspirate right shoulder-culture negative to date. Added cell count and crystal testing to synovitis fluids.  Hyponatremia - Resolved.  Paroxysmal atrial fibrillation -Presently in sinus rhythm -Rate controlled -Continue warfarin per pharmacy . INR supratherapeutic. INR 3.99 on 2/27  Warfarin-induced coagulopathy - Improved.   Hypertension -Continue carvedilol and Imdur. Dose increased by cardiology  SLE/positive anticardiolipin antibodies/history of DVT/PE:  -On chronic Coumadin at home. INR as above.. Status post renal transplant with chronic allograft nephropathy: Continue immunosuppressants  Anemia of CKD -Baseline hemoglobin 9-10 -transfuse one unit PRBC in setting of drop in Hgb and chest pain. Stable.  Right fibular and medial malleolus fracture -As per cardiology, patient would be high risk for surgery with a recent NSTEMI -continue CAM boot -outpt follow up with Dr. Ninfa Linden   DVT prophylaxis: Anticoagulated on Coumadin. CODE STATUS: Full Family Communication:Discussed with patient's daughter Ms. [name]. Updated care and answered questions on 2/28. Disposition Plan: DC home when medically stable.    Procedures/Studies: Ct Head Wo Contrast  03/14/2015  CLINICAL DATA:  Acute onset of dizziness.  Initial encounter. EXAM: CT HEAD WITHOUT CONTRAST TECHNIQUE: Contiguous axial images were obtained from the base of the skull through the vertex without intravenous contrast. COMPARISON:  CT of the head performed 02/14/2015 FINDINGS: There is no evidence of acute infarction, mass lesion, or intra- or extra-axial hemorrhage on CT. Prominence of the ventricles sulci reflects mild cortical volume loss. The brainstem and fourth ventricle are within normal limits. The basal ganglia are unremarkable in appearance. The cerebral hemispheres demonstrate grossly normal gray-white differentiation. No mass effect or midline shift is seen. There is no evidence  of fracture; visualized osseous structures are unremarkable in appearance. The visualized portions of the orbits are within normal limits. The paranasal sinuses and mastoid air cells are well-aerated. No significant soft tissue abnormalities are seen. IMPRESSION: 1. No acute intracranial pathology seen on CT. 2. Mild cortical volume loss noted. Electronically Signed   By: Garald Balding M.D.   On: 03/14/2015 00:35   Ct Humerus Right Wo Contrast  03/18/2015  CLINICAL DATA:  [place] 3-4 days ago.  Injured right upper extremity. EXAM: CT OF THE RIGHT SHOULDER WITHOUT CONTRAST; CT OF THE RIGHT HUMERUS WITHOUT CONTRAST; CT OF THE RIGHT FOREARM WITHOUT CONTRAST TECHNIQUE: Multidetector CT imaging was performed according to the standard protocol. Multiplanar CT image reconstructions were also generated. COMPARISON:  None. FINDINGS: No acute fractures are identified. The shoulder joint is maintained. No fracture or dislocation. Mild AC joint degenerative changes. No AC joint separation. No humeral fracture. There is extensive subacromial/subdeltoid and subscapularis bursitis. There is also a large glenohumeral joint effusion and severe synovitis. Dialysis grafts are noted in the right upper extremity. The elbow joint is maintained. No elbow fracture or obvious joint effusion. Headache cardiac No forearm fractures are identified. There is a moderate-sized right pleural effusion with overlying atelectasis. No obvious acute right rib fractures are identified. The thoracic vertebral bodies are intact. Moderate degenerative changes noted at the sternoclavicular joints. Small scarred right kidney is noted. There is advanced atherosclerotic calcifications involving the aorta and branch vessels. IMPRESSION: No acute fractures of the right upper extremity are identified. The joints are maintained. Extensive synovitis, joint effusion and bursitis involving the right shoulder. Electronically Signed   By: Marijo Sanes M.D.   On:  03/18/2015 09:11   Ct Shoulder Right Wo Contrast  03/18/2015  CLINICAL DATA:  [place] 3-4 days ago.  Injured right upper  extremity. EXAM: CT OF THE RIGHT SHOULDER WITHOUT CONTRAST; CT OF THE RIGHT HUMERUS WITHOUT CONTRAST; CT OF THE RIGHT FOREARM WITHOUT CONTRAST TECHNIQUE: Multidetector CT imaging was performed according to the standard protocol. Multiplanar CT image reconstructions were also generated. COMPARISON:  None. FINDINGS: No acute fractures are identified. The shoulder joint is maintained. No fracture or dislocation. Mild AC joint degenerative changes. No AC joint separation. No humeral fracture. There is extensive subacromial/subdeltoid and subscapularis bursitis. There is also a large glenohumeral joint effusion and severe synovitis. Dialysis grafts are noted in the right upper extremity. The elbow joint is maintained. No elbow fracture or obvious joint effusion. Headache cardiac No forearm fractures are identified. There is a moderate-sized right pleural effusion with overlying atelectasis. No obvious acute right rib fractures are identified. The thoracic vertebral bodies are intact. Moderate degenerative changes noted at the sternoclavicular joints. Small scarred right kidney is noted. There is advanced atherosclerotic calcifications involving the aorta and branch vessels. IMPRESSION: No acute fractures of the right upper extremity are identified. The joints are maintained. Extensive synovitis, joint effusion and bursitis involving the right shoulder. Electronically Signed   By: Marijo Sanes M.D.   On: 03/18/2015 09:11   Ct Forearm Right Wo Contrast  03/18/2015  CLINICAL DATA:  [place] 3-4 days ago.  Injured right upper extremity. EXAM: CT OF THE RIGHT SHOULDER WITHOUT CONTRAST; CT OF THE RIGHT HUMERUS WITHOUT CONTRAST; CT OF THE RIGHT FOREARM WITHOUT CONTRAST TECHNIQUE: Multidetector CT imaging was performed according to the standard protocol. Multiplanar CT image reconstructions were also  generated. COMPARISON:  None. FINDINGS: No acute fractures are identified. The shoulder joint is maintained. No fracture or dislocation. Mild AC joint degenerative changes. No AC joint separation. No humeral fracture. There is extensive subacromial/subdeltoid and subscapularis bursitis. There is also a large glenohumeral joint effusion and severe synovitis. Dialysis grafts are noted in the right upper extremity. The elbow joint is maintained. No elbow fracture or obvious joint effusion. Headache cardiac No forearm fractures are identified. There is a moderate-sized right pleural effusion with overlying atelectasis. No obvious acute right rib fractures are identified. The thoracic vertebral bodies are intact. Moderate degenerative changes noted at the sternoclavicular joints. Small scarred right kidney is noted. There is advanced atherosclerotic calcifications involving the aorta and branch vessels. IMPRESSION: No acute fractures of the right upper extremity are identified. The joints are maintained. Extensive synovitis, joint effusion and bursitis involving the right shoulder. Electronically Signed   By: Marijo Sanes M.D.   On: 03/18/2015 09:11   US Renal  03/15/2015  CLINICAL DATA:  Acute renal failure superimposed on stage 4 chronic kidney disease. EXAM: RENAL / URINARY TRACT ULTRASOUND COMPLETE COMPARISON:  Included portions from chest CT 02/18/2015 FINDINGS: Right Kidney: Not visualized. Left Kidney: Not visualized. Transplant kidney located in the pelvis, side not specified. There is no transplant hydronephrosis. Blood flow seen. Transplant kidney measures 10 cm. Transplant cortical thickness appears maintained. No focal lesion. Detailed transplant Doppler evaluation not performed. Bladder: Appears normal for degree of bladder distention. IMPRESSION: 1. Native kidneys not identified, likely secondary renal atrophy. 2. Pelvic transplant kidney without hydronephrosis. Electronically Signed   By: Jeb Levering M.D.   On: 03/15/2015 23:08   Dg Chest Portable 1 View  03/14/2015  CLINICAL DATA:  Fever and weakness tonight. EXAM: PORTABLE CHEST 1 VIEW COMPARISON:  Radiograph 02/18/2015.  Chest CT 02/16/2015 FINDINGS: Cardiomediastinal contours are unchanged allowing for patient rotation. Small left pleural effusion, diminished from prior. No evidence of  right pleural effusion. Left basilar scarring. No pulmonary edema. No confluent airspace disease. No pneumothorax. IMPRESSION: Persistent but decreased left pleural effusion. Exam is otherwise unchanged. Electronically Signed   By: Jeb Levering M.D.   On: 03/14/2015 02:09   Dg Fluoro Guided Needle Plc Aspiration/injection Loc  03/21/2015  CLINICAL DATA:  Sinusitis, joint effusion and bursitis involving the right shoulder on CT 03/17/2015. EXAM: RIGHT SHOULDER ASPIRATION UNDER FLUOROSCOPY TECHNIQUE: Despite an INR of 3.99, Dr. Meridee Score insisted on a relatively emergent right shoulder aspiration due to the possibility of sepsis. The increased risk of bleeding was discussed with the patient prior to the procedure, as well as the risk of infection. Patient elected to proceed with the procedure. Informed consent was obtained. An appropriate skin entrance site was determined. The site was marked, prepped with Betadine, draped in the usual sterile fashion, and infiltrated locally with buffered Lidocaine. Based on CT imaging, 80 20 gauge spinal needle was inserted over the mid humeral head and 5-60 cc of serosanguineous fluid returned. No immediate complication. FLUOROSCOPY TIME:  Radiation Exposure Index (as provided by the fluoroscopic device): If the device does not provide the exposure index: Fluoroscopy Time (in minutes and seconds):  0 minutes 24 seconds. Number of Acquired Images:  None. FINDINGS: Despite an INR of 3.99, Dr. Meridee Score insisted on a relatively emergent right shoulder aspiration due to the possibility of sepsis. The increased risk of  bleeding was discussed with the patient prior to the procedure, as well as the risk of infection. Patient elected to proceed with the procedure. Informed consent was obtained. An appropriate skin entrance site was determined. The site was marked, prepped with Betadine, draped in the usual sterile fashion, and infiltrated locally with buffered Lidocaine. Based on CT imaging, a 20 gauge spinal needle was inserted over the mid humeral head with return of 5-6 cc of serosanguineous fluid. No immediate complication. IMPRESSION: Technically successful right shoulder aspiration. Electronically Signed   By: Lorin Picket M.D.   On: 03/21/2015 15:16  TEE 03/22/2015:  Normal EF 55% AV sclerosis mild AR Mild MR Mild PR Moderate TR No PFO No LAA thrombus Mild aortic debris No vegetations or evidence of SBE     Subjective: Patient seen post TEE. Complains of some throat discomfort. Denies any other complaints.  Objective: Filed Vitals:   03/22/15 0905 03/22/15 0915 03/22/15 0925 03/22/15 1000  BP: 131/50 132/54 132/58 147/60  Pulse: 77 75 76 80  Temp:   98.1 F (36.7 C) 98.7 F (37.1 C)  TempSrc:   Oral Oral  Resp: 26 21 18 22   Height:      Weight:      SpO2: 94% 94% 96% 98%    Intake/Output Summary (Last 24 hours) at 03/22/15 1418 Last data filed at 03/22/15 1010  Gross per 24 hour  Intake    780 ml  Output    101 ml  Net    679 ml   Weight change:  Exam:   General:  Pleasant middle-aged female lying comfortably supine in bed.  Cardiovascular: RRR, S1/S2, no rubs, no gallops.   Respiratory: CTA bilaterally, no wheezing, no crackles, no rhonchi  Abdomen: Soft/+BS, non tender, non distended, no guarding  Extremities: 1+RLE edema, No lymphangitis, No petechiae, No rashes, no synovitis. Right leg in cam boot. Right upper extremity swelling persists but no acute findings. No other acute findings.  Data Reviewed: Basic Metabolic Panel:  Recent Labs Lab 03/17/15 0703  03/18/15 0530 03/19/15 [id number]  03/20/15 1115 03/21/15 0527  NA 136 134* 138 139 137  K 4.3 4.7 4.8 4.8 4.8  CL 107 103 106 105 106  CO2 22 21* 22 23 21*  GLUCOSE 124* 182* 151* 118* 135*  BUN 69* 58* 59* 54* 53*  CREATININE 3.41* 2.69* 2.34* 2.28* 2.25*  CALCIUM 9.1 9.6 9.4 9.8 9.1  PHOS  --  3.4 2.5 2.3* 2.5   Liver Function Tests:  Recent Labs Lab 03/18/15 0530 03/19/15 0553 03/20/15 1115 03/21/15 0527  ALBUMIN 2.2* 1.9* 2.0* 2.0*   No results for input(s): LIPASE, AMYLASE in the last 168 hours. No results for input(s): AMMONIA in the last 168 hours. CBC:  Recent Labs Lab 03/16/15 0509 03/17/15 0703 03/19/15 0553 03/20/15 1115 03/21/15 0527  WBC 9.4 6.4 7.8 5.8 7.9  HGB 8.9* 9.1* 8.7* 9.7* 8.8*  HCT 27.6* 27.3* 27.0* 28.9* 27.3*  MCV 83.4 85.8 84.9 85.0 84.0  PLT 173 155 151 137* 125*   Cardiac Enzymes:  Recent Labs Lab 03/17/15 0703 03/17/15 1211 03/17/15 1819  TROPONINI 0.04* 0.04* 0.08*   BNP: Invalid input(s): POCBNP CBG: No results for input(s): GLUCAP in the last 168 hours.  Recent Results (from the past 240 hour(s))  Blood culture (routine x 2)     Status: None   Collection Time: 03/14/15  1:15 AM  Result Value Ref Range Status   Specimen Description BLOOD BLOOD LEFT FOREARM  Final   Special Requests BOTTLES DRAWN AEROBIC AND ANAEROBIC 5CC  Final   Culture  Setup Time   Final    GRAM POSITIVE COCCI IN CLUSTERS IN BOTH AEROBIC AND ANAEROBIC BOTTLES CRITICAL RESULT CALLED TO, READ BACK BY AND VERIFIED WITH: Vivianne Spence RN 2017 03/14/15 A BROWNING    Culture   Final    ENTEROCOCCUS SPECIES SUSCEPTIBILITIES PERFORMED ON PREVIOUS CULTURE WITHIN THE LAST 5 DAYS. STAPHYLOCOCCUS SPECIES (COAGULASE NEGATIVE) THE SIGNIFICANCE OF ISOLATING THIS ORGANISM FROM A SINGLE SET OF BLOOD CULTURES WHEN MULTIPLE SETS ARE DRAWN IS UNCERTAIN. PLEASE NOTIFY THE MICROBIOLOGY DEPARTMENT WITHIN ONE WEEK IF SPECIATION AND SENSITIVITIES ARE REQUIRED.    Report Status  03/17/2015 FINAL  Final  Blood culture (routine x 2)     Status: None   Collection Time: 03/14/15  1:33 AM  Result Value Ref Range Status   Specimen Description BLOOD RIGHT WRIST  Final   Special Requests BOTTLES DRAWN AEROBIC AND ANAEROBIC 5CC   Final   Culture  Setup Time   Final    GRAM POSITIVE COCCI IN CHAINS IN BOTH AEROBIC AND ANAEROBIC BOTTLES CRITICAL RESULT CALLED TO, READ BACK BY AND VERIFIED WITH: K MOORE,RN @0144  03/15/15 MKELLY    Culture ENTEROCOCCUS SPECIES  Final   Report Status 03/17/2015 FINAL  Final   Organism ID, Bacteria ENTEROCOCCUS SPECIES  Final      Susceptibility   Enterococcus species - MIC*    AMPICILLIN <=2 SENSITIVE Sensitive     VANCOMYCIN 1 SENSITIVE Sensitive     GENTAMICIN SYNERGY SENSITIVE Sensitive     * ENTEROCOCCUS SPECIES  Culture, blood (Routine X 2) w Reflex to ID Panel     Status: None   Collection Time: 03/16/15  5:00 AM  Result Value Ref Range Status   Specimen Description BLOOD LEFT HAND  Final   Special Requests AEROBIC BOTTLE ONLY 5ML  Final   Culture  Setup Time   Final    GRAM POSITIVE COCCI IN PAIRS IN CHAINS AEROBIC BOTTLE ONLY CRITICAL RESULT CALLED TO, READ BACK BY AND  VERIFIED WITH: A. LOWE,RN AT [id number] ON [id number] BY Rhea Bleacher    Culture   Final    ENTEROCOCCUS SPECIES SUSCEPTIBILITIES PERFORMED ON PREVIOUS CULTURE WITHIN THE LAST 5 DAYS.    Report Status 03/19/2015 FINAL  Final  Culture, blood (Routine X 2) w Reflex to ID Panel     Status: None (Preliminary result)   Collection Time: 03/16/15  5:04 AM  Result Value Ref Range Status   Specimen Description BLOOD LEFT FINGER  Final   Special Requests IN PEDIATRIC BOTTLE  Final   Culture  Setup Time   Final    GRAM POSITIVE COCCI IN CLUSTERS IN PAIRS AEROBIC BOTTLE ONLY CRITICAL RESULT CALLED TO, READ BACK BY AND VERIFIED WITHBarnabas Harries RN 2321 03/18/15 A BROWNING    Culture GRAM POSITIVE COCCI IDENTIFICATION TO FOLLOW   Final   Report Status PENDING  Incomplete   Culture, blood (Routine X 2) w Reflex to ID Panel     Status: None (Preliminary result)   Collection Time: 03/18/15  8:54 AM  Result Value Ref Range Status   Specimen Description BLOOD BLOOD LEFT HAND  Final   Special Requests IN PEDIATRIC BOTTLE 2.5CC  Final   Culture NO GROWTH 3 DAYS  Final   Report Status PENDING  Incomplete  Culture, blood (Routine X 2) w Reflex to ID Panel     Status: None (Preliminary result)   Collection Time: 03/18/15  9:01 AM  Result Value Ref Range Status   Specimen Description BLOOD BLOOD LEFT HAND  Final   Special Requests IN PEDIATRIC BOTTLE 1CC  Final   Culture NO GROWTH 3 DAYS  Final   Report Status PENDING  Incomplete  Body fluid culture     Status: None (Preliminary result)   Collection Time: 03/21/15  2:00 PM  Result Value Ref Range Status   Specimen Description FLUID SYNOVIAL RIGHT SHOULDER  Final   Special Requests NONE  Final   Gram Stain   Final    ABUNDANT WBC PRESENT, PREDOMINANTLY PMN NO ORGANISMS SEEN    Culture NO GROWTH < 24 HOURS  Final   Report Status PENDING  Incomplete     Scheduled Meds: . allopurinol  200 mg Oral Daily  . amLODipine  5 mg Oral Daily  . ampicillin (OMNIPEN) IV  2 g Intravenous 3 times per day  . atorvastatin  40 mg Oral q1800  . calcitRIOL  0.25 mcg Oral QODAY  . calcitRIOL  0.5 mcg Oral QODAY  . carvedilol  25 mg Oral BID WC  . cefTRIAXone (ROCEPHIN)  IV  2 g Intravenous Q12H  . darbepoetin (ARANESP) injection - NON-DIALYSIS  100 mcg Subcutaneous Q Sat-1800  . famotidine  20 mg Oral Daily  . feeding supplement (NEPRO CARB STEADY)  237 mL Oral BID BM  . feeding supplement (PRO-STAT SUGAR FREE 64)  30 mL Oral Daily  . furosemide  40 mg Oral Daily  . isosorbide mononitrate  90 mg Oral Daily  . latanoprost  1 drop Both Eyes QHS  . magnesium oxide  400 mg Oral BID  . mycophenolate  180 mg Oral BID  . predniSONE  5 mg Oral Q breakfast  . ranolazine  500 mg Oral BID  . sodium bicarbonate  1,300 mg Oral BID   . tacrolimus  2 mg Oral BID  . Warfarin - Pharmacist Dosing Inpatient   Does not apply q1800   Continuous Infusions:   Luane Rochon, MD, FACP, FHM. [facility] Pager [phone]  If 7PM-7AM, please contact night-coverage [email or web address] Password [email or web address] 03/22/2015, 2:18 PM   LOS: 8 days

## 2015-03-22 NOTE — Progress Notes (Signed)
  Echocardiogram Echocardiogram Transesophageal has been performed.  Debbie [name] 03/22/2015, 8:45 AM

## 2015-03-22 NOTE — Progress Notes (Signed)
Subjective:  No new complaints   Antibiotics:  Anti-infectives    Start     Dose/Rate Route Frequency Ordered Stop   03/21/15 2200  ampicillin (OMNIPEN) 2 g in sodium chloride 0.9 % 50 mL IVPB     2 g 150 mL/hr over 20 Minutes Intravenous 3 times per day 03/21/15 1427     03/19/15 1315  cefTRIAXone (ROCEPHIN) 2 g in dextrose 5 % 50 mL IVPB     2 g 100 mL/hr over 30 Minutes Intravenous Every 12 hours 03/19/15 1313     03/18/15 1100  ampicillin (OMNIPEN) 2 g in sodium chloride 0.9 % 50 mL IVPB  Status:  Discontinued     2 g 150 mL/hr over 20 Minutes Intravenous Every 12 hours 03/18/15 0820 03/21/15 1427   03/17/15 1100  DAPTOmycin (CUBICIN) 575 mg in sodium chloride 0.9 % IVPB  Status:  Discontinued     575 mg 223 mL/hr over 30 Minutes Intravenous Every 48 hours 03/16/15 1122 03/18/15 0820   03/15/15 1100  DAPTOmycin (CUBICIN) 530 mg in sodium chloride 0.9 % IVPB  Status:  Discontinued     530 mg 221.2 mL/hr over 30 Minutes Intravenous Every 48 hours 03/15/15 0847 03/16/15 1122   03/14/15 2230  linezolid (ZYVOX) IVPB 600 mg  Status:  Discontinued     600 mg 300 mL/hr over 60 Minutes Intravenous Every 12 hours 03/14/15 2220 03/15/15 0830      Medications: Scheduled Meds: . allopurinol  200 mg Oral Daily  . amLODipine  5 mg Oral Daily  . ampicillin (OMNIPEN) IV  2 g Intravenous 3 times per day  . atorvastatin  40 mg Oral q1800  . calcitRIOL  0.25 mcg Oral QODAY  . calcitRIOL  0.5 mcg Oral QODAY  . carvedilol  25 mg Oral BID WC  . cefTRIAXone (ROCEPHIN)  IV  2 g Intravenous Q12H  . darbepoetin (ARANESP) injection - NON-DIALYSIS  100 mcg Subcutaneous Q Sat-1800  . famotidine  20 mg Oral Daily  . feeding supplement (NEPRO CARB STEADY)  237 mL Oral BID BM  . feeding supplement (PRO-STAT SUGAR FREE 64)  30 mL Oral Daily  . furosemide  40 mg Oral Daily  . isosorbide mononitrate  90 mg Oral Daily  . latanoprost  1 drop Both Eyes QHS  . magnesium oxide  400 mg Oral BID   . mycophenolate  180 mg Oral BID  . predniSONE  5 mg Oral Q breakfast  . ranolazine  500 mg Oral BID  . sodium bicarbonate  1,300 mg Oral BID  . tacrolimus  2 mg Oral BID  . Warfarin - Pharmacist Dosing Inpatient   Does not apply q1800   Continuous Infusions:   PRN Meds:.acetaminophen **OR** acetaminophen, fentaNYL (SUBLIMAZE) injection, magic mouthwash w/lidocaine, nitroGLYCERIN, ondansetron **OR** ondansetron (ZOFRAN) IV, oxyCODONE-acetaminophen    Objective: Weight change:   Intake/Output Summary (Last 24 hours) at 03/22/15 1438 Last data filed at 03/22/15 1010  Gross per 24 hour  Intake    780 ml  Output    101 ml  Net    679 ml   Blood pressure 147/60, pulse 80, temperature 98.7 F (37.1 C), temperature source Oral, resp. rate 22, height 5\' 1"  (1.549 m), weight 155 lb 10.3 oz (70.6 kg), SpO2 98 %. Temp:  [89.4 F (31.9 C)-98.7 F (37.1 C)] 98.7 F (37.1 C) (02/28 1000) Pulse Rate:  [69-87] 80 (02/28 1000) Resp:  [17-26] 22 (02/28 1000)  BP: (118-175)/(50-96) 147/60 mmHg (02/28 1000) SpO2:  [94 %-100 %] 98 % (02/28 1000)  Physical Exam: General: resting after TEE HEENT: anicteric sclera,  Pulmonary:, no wheezing,  Neuro: nonfocal, strength and sensation intact  CBC:  CBC Latest Ref Rng 03/21/2015 03/20/2015 03/19/2015  WBC 4.0 - 10.5 K/uL 7.9 5.8 7.8  Hemoglobin 12.0 - 15.0 g/dL 8.8(L) 9.7(L) 8.7(L)  Hematocrit 36.0 - 46.0 % 27.3(L) 28.9(L) 27.0(L)  Platelets 150 - 400 K/uL 125(L) 137(L) 151      BMET  Recent Labs  03/20/15 1115 03/21/15 0527  NA 139 137  K 4.8 4.8  CL 105 106  CO2 23 21*  GLUCOSE 118* 135*  BUN 54* 53*  CREATININE 2.28* 2.25*  CALCIUM 9.8 9.1     Liver Panel   Recent Labs  03/20/15 1115 03/21/15 0527  ALBUMIN 2.0* 2.0*       Sedimentation Rate No results for input(s): ESRSEDRATE in the last 72 hours. C-Reactive Protein No results for input(s): CRP in the last 72 hours.  Micro Results: Recent Results (from the  past 720 hour(s))  Blood culture (routine x 2)     Status: None   Collection Time: 03/14/15  1:15 AM  Result Value Ref Range Status   Specimen Description BLOOD BLOOD LEFT FOREARM  Final   Special Requests BOTTLES DRAWN AEROBIC AND ANAEROBIC 5CC  Final   Culture  Setup Time   Final    GRAM POSITIVE COCCI IN CLUSTERS IN BOTH AEROBIC AND ANAEROBIC BOTTLES CRITICAL RESULT CALLED TO, READ BACK BY AND VERIFIED WITH: Vivianne Spence RN 2017 03/14/15 A BROWNING    Culture   Final    ENTEROCOCCUS SPECIES SUSCEPTIBILITIES PERFORMED ON PREVIOUS CULTURE WITHIN THE LAST 5 DAYS. STAPHYLOCOCCUS SPECIES (COAGULASE NEGATIVE) THE SIGNIFICANCE OF ISOLATING THIS ORGANISM FROM A SINGLE SET OF BLOOD CULTURES WHEN MULTIPLE SETS ARE DRAWN IS UNCERTAIN. PLEASE NOTIFY THE MICROBIOLOGY DEPARTMENT WITHIN ONE WEEK IF SPECIATION AND SENSITIVITIES ARE REQUIRED.    Report Status 03/17/2015 FINAL  Final  Blood culture (routine x 2)     Status: None   Collection Time: 03/14/15  1:33 AM  Result Value Ref Range Status   Specimen Description BLOOD RIGHT WRIST  Final   Special Requests BOTTLES DRAWN AEROBIC AND ANAEROBIC 5CC   Final   Culture  Setup Time   Final    GRAM POSITIVE COCCI IN CHAINS IN BOTH AEROBIC AND ANAEROBIC BOTTLES CRITICAL RESULT CALLED TO, READ BACK BY AND VERIFIED WITH: K MOORE,RN @0144  03/15/15 MKELLY    Culture ENTEROCOCCUS SPECIES  Final   Report Status 03/17/2015 FINAL  Final   Organism ID, Bacteria ENTEROCOCCUS SPECIES  Final      Susceptibility   Enterococcus species - MIC*    AMPICILLIN <=2 SENSITIVE Sensitive     VANCOMYCIN 1 SENSITIVE Sensitive     GENTAMICIN SYNERGY SENSITIVE Sensitive     * ENTEROCOCCUS SPECIES  Culture, blood (Routine X 2) w Reflex to ID Panel     Status: None   Collection Time: 03/16/15  5:00 AM  Result Value Ref Range Status   Specimen Description BLOOD LEFT HAND  Final   Special Requests AEROBIC BOTTLE ONLY 5ML  Final   Culture  Setup Time   Final    GRAM POSITIVE  COCCI IN PAIRS IN CHAINS AEROBIC BOTTLE ONLY CRITICAL RESULT CALLED TO, READ BACK BY AND VERIFIED WITH: A. LOWE,RN AT [id number] ON [id number] BY Rhea Bleacher    Culture   Final  ENTEROCOCCUS SPECIES SUSCEPTIBILITIES PERFORMED ON PREVIOUS CULTURE WITHIN THE LAST 5 DAYS.    Report Status 03/19/2015 FINAL  Final  Culture, blood (Routine X 2) w Reflex to ID Panel     Status: None (Preliminary result)   Collection Time: 03/16/15  5:04 AM  Result Value Ref Range Status   Specimen Description BLOOD LEFT FINGER  Final   Special Requests IN PEDIATRIC BOTTLE  Final   Culture  Setup Time   Final    GRAM POSITIVE COCCI IN CLUSTERS IN PAIRS AEROBIC BOTTLE ONLY CRITICAL RESULT CALLED TO, READ BACK BY AND VERIFIED WITHBarnabas Harries RN [id number] 03/18/15 A BROWNING    Culture GRAM POSITIVE COCCI IDENTIFICATION TO FOLLOW   Final   Report Status PENDING  Incomplete  Culture, blood (Routine X 2) w Reflex to ID Panel     Status: None (Preliminary result)   Collection Time: 03/18/15  8:54 AM  Result Value Ref Range Status   Specimen Description BLOOD BLOOD LEFT HAND  Final   Special Requests IN PEDIATRIC BOTTLE 2.5CC  Final   Culture NO GROWTH 3 DAYS  Final   Report Status PENDING  Incomplete  Culture, blood (Routine X 2) w Reflex to ID Panel     Status: None (Preliminary result)   Collection Time: 03/18/15  9:01 AM  Result Value Ref Range Status   Specimen Description BLOOD BLOOD LEFT HAND  Final   Special Requests IN PEDIATRIC BOTTLE 1CC  Final   Culture NO GROWTH 3 DAYS  Final   Report Status PENDING  Incomplete  Body fluid culture     Status: None (Preliminary result)   Collection Time: 03/21/15  2:00 PM  Result Value Ref Range Status   Specimen Description FLUID SYNOVIAL RIGHT SHOULDER  Final   Special Requests NONE  Final   Gram Stain   Final    ABUNDANT WBC PRESENT, PREDOMINANTLY PMN NO ORGANISMS SEEN    Culture NO GROWTH < 24 HOURS  Final   Report Status PENDING  Incomplete     Studies/Results: Dg Fluoro Guided Needle Plc Aspiration/injection Loc  03/21/2015  CLINICAL DATA:  Sinusitis, joint effusion and bursitis involving the right shoulder on CT 03/17/2015. EXAM: RIGHT SHOULDER ASPIRATION UNDER FLUOROSCOPY TECHNIQUE: Despite an INR of 3.99, Dr. Meridee Score insisted on a relatively emergent right shoulder aspiration due to the possibility of sepsis. The increased risk of bleeding was discussed with the patient prior to the procedure, as well as the risk of infection. Patient elected to proceed with the procedure. Informed consent was obtained. An appropriate skin entrance site was determined. The site was marked, prepped with Betadine, draped in the usual sterile fashion, and infiltrated locally with buffered Lidocaine. Based on CT imaging, 80 20 gauge spinal needle was inserted over the mid humeral head and 5-60 cc of serosanguineous fluid returned. No immediate complication. FLUOROSCOPY TIME:  Radiation Exposure Index (as provided by the fluoroscopic device): If the device does not provide the exposure index: Fluoroscopy Time (in minutes and seconds):  0 minutes 24 seconds. Number of Acquired Images:  None. FINDINGS: Despite an INR of 3.99, Dr. Meridee Score insisted on a relatively emergent right shoulder aspiration due to the possibility of sepsis. The increased risk of bleeding was discussed with the patient prior to the procedure, as well as the risk of infection. Patient elected to proceed with the procedure. Informed consent was obtained. An appropriate skin entrance site was determined. The site was marked, prepped with Betadine, draped in  the usual sterile fashion, and infiltrated locally with buffered Lidocaine. Based on CT imaging, a 20 gauge spinal needle was inserted over the mid humeral head with return of 5-6 cc of serosanguineous fluid. No immediate complication. IMPRESSION: Technically successful right shoulder aspiration. Electronically Signed   By: Lorin Picket M.D.   On: 03/21/2015 15:16      Assessment/Plan:  INTERVAL HISTORY:  03/17/15: now BOTH cultures are growing Enterococcus 03/18/15: repeat cultures also + 1/2 , and CT showing large effusion right shoulder 03/22/15: TEE without vegetations  Principal Problem:   Acute renal failure superimposed on stage 4 chronic kidney disease ([place]) Active Problems:   CKD (chronic kidney disease), stage IV ([facility])   HTN (hypertension)   Chronic anticoagulation - with Coumadin   Dyslipidemia   Pulmonary hypertension ([place])   NSTEMI- Jan 2017- medical Rx   Renal transplant recipient   Coagulopathy ([name])   Cardiomyopathy, ischemic-40-45%   Supratherapeutic INR   LBBB (left bundle branch block)   Gram-positive bacteremia   Enterococcal bacteremia   Bacteremia   Arm pain, right   Pyogenic arthritis of right shoulder region ([place])   Effusion of shoulder joint    Debbie [name] is a 65 y.o. female with  65 y.o. female with renal transplant on immunosuppressive drugs with recent admission for NSTEMI now admitted with dizziness, weakness and fevers and found to have Enterococcus  2/2 blood cultures, with perstently positive blood cultures and likely septic shoulder  #1 Enterococcal bacteremia:    TEE without vegetations  Continue AMP and Ceftriaxone 2 g IV q 12 hours given that we still have concern for serious enterococcal infection in the shoulder  And she MEETS 2 MAJOR criteria for [facility] clinical criteria for endocarditis with typical organism for endocarditis and 2 sets positive blood cultures > 12 hours apart  Repeat cultures are NG to date finally  #2 Right shoulder, arm and forearm pain: CT shows large effusion concerning for septic shoulder esp given onset 2 weeks ago and bacteremia  --Greatly appreciate Orthopedics and IR   --PLEASE SEND FLUID FOR   #1 CELL COUNT AND DIFFERENTIAL (FIRST PRIORITY) #2 CULTURE #3 CRYSTALS  Given that she has been on antibiotics it will not be  surprising if culture is negative which is why the [place] is so important to diagnosis here  She is going to need surgery if this is indeed septic joint which seems by far the most likely diagnosis  Dr. Megan Salon will pick up the service tomorrow      LOS: 8 days   Alcide Evener 03/22/2015, 2:38 PM

## 2015-03-22 NOTE — CV Procedure (Signed)
During this procedure the patient is administered a total of Versed 5 mg and Fentanyl 25 mg to achieve and maintain moderate conscious sedation.  The patient's heart rate, blood pressure, and oxygen saturation are monitored continuously during the procedure. The period of conscious sedation is 30 minutes, of which I was present face-to-face 100% of this time.  Normal EF 55% AV sclerosis mild AR Mild MR Mild PR Moderate TR No PFO No LAA thrombus Mild aortic debris No vegetations or evidence of SBE  Jenkins Rouge

## 2015-03-23 ENCOUNTER — Encounter (HOSPITAL_COMMUNITY): Payer: Self-pay | Admitting: Cardiovascular Disease

## 2015-03-23 DIAGNOSIS — Z7901 Long term (current) use of anticoagulants: Secondary | ICD-10-CM

## 2015-03-23 DIAGNOSIS — M25411 Effusion, right shoulder: Secondary | ICD-10-CM

## 2015-03-23 DIAGNOSIS — D689 Coagulation defect, unspecified: Secondary | ICD-10-CM

## 2015-03-23 DIAGNOSIS — E785 Hyperlipidemia, unspecified: Secondary | ICD-10-CM

## 2015-03-23 LAB — CBC
HCT: 32.1 % — ABNORMAL LOW (ref 36.0–46.0)
Hemoglobin: 9.9 g/dL — ABNORMAL LOW (ref 12.0–15.0)
MCH: 26 pg (ref 26.0–34.0)
MCHC: 30.8 g/dL (ref 30.0–36.0)
MCV: 84.3 fL (ref 78.0–100.0)
Platelets: 69 10*3/uL — ABNORMAL LOW (ref 150–400)
RBC: 3.81 MIL/uL — ABNORMAL LOW (ref 3.87–5.11)
RDW: 17.8 % — AB (ref 11.5–15.5)
WBC: 5.7 10*3/uL (ref 4.0–10.5)

## 2015-03-23 LAB — CULTURE, BLOOD (ROUTINE X 2)
Culture: NO GROWTH
Culture: NO GROWTH

## 2015-03-23 LAB — SYNOVIAL CELL COUNT + DIFF, W/ CRYSTALS
Crystals, Fluid: NONE SEEN
EOSINOPHILS-SYNOVIAL: 0 % (ref 0–1)
LYMPHOCYTES-SYNOVIAL FLD: 1 % (ref 0–20)
Monocyte-Macrophage-Synovial Fluid: 4 % — ABNORMAL LOW (ref 50–90)
NEUTROPHIL, SYNOVIAL: 95 % — AB (ref 0–25)
WBC, Synovial: 32500 /mm3 — ABNORMAL HIGH (ref 0–200)

## 2015-03-23 LAB — PROTIME-INR
INR: 3.7 — AB (ref 0.00–1.49)
Prothrombin Time: 35.9 seconds — ABNORMAL HIGH (ref 11.6–15.2)

## 2015-03-23 MED ORDER — SODIUM CHLORIDE 0.9 % IV SOLN
2.0000 g | Freq: Three times a day (TID) | INTRAVENOUS | Status: DC
  Administered 2015-03-23 – 2015-03-28 (×15): 2 g via INTRAVENOUS
  Filled 2015-03-23 (×17): qty 2000

## 2015-03-23 NOTE — Progress Notes (Signed)
PROGRESS NOTE  Debbie [name] [id number] DOB: September 21, 1950 DOA: 03/13/2015 PCP: No PCP Per Patient   Subjective: Denies any fever or chills, continues to have a right upper extremity swelling.  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 Jacques Navy appointment 02/16/15) presented to [facility] with 1 day history of dizziness and generalized weakness. The patient was recently discharged from the hospital on 02/21/2015 after treatment for NSTEMI and acute on chronic renal failure. The patient states that she has had poor by mouth intake for the past 2-3 days with constipation. Upon presentation, she was noted to have a serum creatinine of 4.31. The patient received 2 L bolus of fluid in the emergency department. She was also found to have INR of 8.14. On the afternoon of 03/14/2015, the patient developed substernal chest pain for which she received nitroglycerin with relief. Cardiology was consulted. EKG showed a new LBBB.   Assessment/Plan:  Acute on chronic renal failure (CKD 4) in pt with renal transplant -due to volume depletion and infection -restarted judicious IVF -baseline creatinine 2.2-2.3 -Patient endorses compliance with all her medications including immunosuppressive therapy - Renal ultrasound: Native kidneys not identified possibly due to atrophy. Transplanted kidney without hydronephrosis. - Creatinine was 2.2 on 1/30. Admitted with creatinine of 4.31 and is  gradually improving (3.41 on 2/23). - Requested nephrology consultation given complex renal issues and patient wishes to speak with them prior to deciding on TEE. - Creatinine down to 2.3 which is her baseline. IV fluids discontinued. Nephrology follow-up appreciated.  Mild hyperkalemia - Resolved after a dose of Kayexalate.  Enterococcus bacteremia -source unclear. 2/20: 1/2 blood cultures positive for enterococcus. 2nd blood culture positive for enterococcus and Coag neg staphylococcus. 2/22: Blood culture 2: Also positive - pt has anaphylaxis to vancomycin - Infectious disease follow-up appreciated. ID recommends TEE - patient had been contemplating and reluctant all along but has finally consented to having it on 2/26. - Cubicin changed to IV ampicillin & Rocephin. Surveillance blood culture(2/24)  neg to date.  - CT right upper extremity shows right shoulder effusion. - S/P aspiration of the right shoulder, G stain showed abundant WBC. No WBC count.  Chest pain and new LBBB on EKG -Concerning for angina with new left bundle branch block -relieved after SL NTG x 1 -EKG--new LBBB -pt had NSTEMI last admission in Jan 2017 -02/15/2015 echo EF 40-45%, PAP 60 -continue ASA -continue imdur/Ranexa -defer Ranexa pharmacy concern to cardiology -elevated troponins likely due to renal failure - Cardiology follow-up appreciated: Suspect anemia contribute it to her angina. Continue carvedilol, Lipitor, Imdur and Ranexa. Cardiology does not recommend any further workup and had signed off 2/22. However due to recurrent NTG responsive chest pain, cardiology has seen again on 2/23. EKG without acute changes and no LBBB at this time (LBBB is intermittent).  - Cardiology has increased dose of Imdur and beta blocker. Ranexa held secondary to renal insufficiency-consider resuming as renal function improves.  Right upper extremity pain and swelling - Reported on 2/23. Right upper extremity distal to  elbow does appear  diffusely swollen but not warm and is also tender without other features of cellulitis or arthritis. Good radial pulsation felt.? Secondary to IV fluids versus rule out DVT versus gout flare. - Elevated right upper extremity. Requested charge nurse to cut restricting band on right wrist. Right upper extremity venous Doppler negative for DVT. Did receive a dose of prednisone 40 mg on 2/23. Right upper extremity swelling and pain improved. As per nephrology, she may need further evaluation for central vein stenosis-has right upper extremity AV graft. - CT shows right shoulder effusion. Requested orthopedic/Dr. Sharol Given consultation appreciated: discussed with Dr. Sharol Given 2/26 and he was requested IR aspirate right shoulder. He was not convinced of gout.  Hyponatremia -Secondary to volume depletion - Sodium normal today.  Paroxysmal atrial fibrillation -Presently in sinus rhythm -Rate controlled -Continue warfarin. INR therapeutic.  Warfarin-induced coagulopathy -Patient has supratherapeutic INR--8.43 on admission. Decreased. -As the patient is not actively bleeding and hemodynamically stable, allow INR to drift down -vitamin K if bleed or INR continues to rise  Hypertension -Continue carvedilol and Imdur. Dose increased by cardiology  SLE/positive anticardiolipin antibodies/history of DVT/PE:  -On chronic Coumadin at home. INR as above.. Status post renal transplant with chronic allograft nephropathy: Continue immunosuppressants  Anemia of CKD -Baseline hemoglobin 9-10 -Transfuse one unit PRBC in setting of drop in Hgb and chest pain. Stable.  Right fibular and medial malleolus fracture -As per cardiology, patient would be high risk for surgery with a recent NSTEMI -Continue CAM boot. -Outpt follow up with Dr. Ninfa Linden   DVT prophylaxis: Anticoagulated on Coumadin. CODE STATUS: Full Family Communication: None at bedside Disposition Plan: DC home when medically  stable.    Procedures/Studies: Ct Head Wo Contrast  03/14/2015  CLINICAL DATA:  Acute onset of dizziness.  Initial encounter. EXAM: CT HEAD WITHOUT CONTRAST TECHNIQUE: Contiguous axial images were obtained from the base of the skull through the vertex without intravenous contrast. COMPARISON:  CT of the head performed 02/14/2015 FINDINGS: There is no evidence of acute infarction, mass lesion, or intra- or extra-axial hemorrhage on CT. Prominence of the ventricles sulci reflects mild cortical volume loss. The brainstem and fourth ventricle are within normal limits. The basal ganglia are unremarkable in appearance. The cerebral hemispheres demonstrate grossly normal gray-white differentiation. No mass effect or midline shift is seen. There is no evidence of fracture; visualized osseous structures are unremarkable in appearance. The visualized portions of the orbits are within normal limits. The paranasal sinuses and mastoid air cells are well-aerated. No significant soft tissue abnormalities are seen. IMPRESSION: 1. No acute intracranial pathology seen on CT. 2. Mild cortical volume loss noted. Electronically Signed   By: Garald Balding M.D.   On: 03/14/2015 00:35   Ct Humerus Right Wo Contrast  03/18/2015  CLINICAL DATA:  [place] 3-4 days ago.  Injured right upper extremity. EXAM: CT OF THE RIGHT SHOULDER WITHOUT CONTRAST; CT OF THE RIGHT HUMERUS WITHOUT CONTRAST; CT OF THE RIGHT FOREARM WITHOUT CONTRAST TECHNIQUE: Multidetector CT imaging was performed according to the standard protocol. Multiplanar CT image reconstructions were also generated. COMPARISON:  None. FINDINGS: No acute fractures are identified. The shoulder joint is maintained. No fracture or dislocation. Mild AC joint degenerative changes. No AC joint separation. No humeral fracture. There is extensive subacromial/subdeltoid and subscapularis bursitis. There is also a large glenohumeral joint effusion and severe synovitis. Dialysis grafts are  noted in the right upper extremity. The elbow joint is maintained. No elbow fracture or obvious joint effusion. Headache cardiac No forearm  fractures are identified. There is a moderate-sized right pleural effusion with overlying atelectasis. No obvious acute right rib fractures are identified. The thoracic vertebral bodies are intact. Moderate degenerative changes noted at the sternoclavicular joints. Small scarred right kidney is noted. There is advanced atherosclerotic calcifications involving the aorta and branch vessels. IMPRESSION: No acute fractures of the right upper extremity are identified. The joints are maintained. Extensive synovitis, joint effusion and bursitis involving the right shoulder. Electronically Signed   By: Marijo Sanes M.D.   On: 03/18/2015 09:11   Ct Shoulder Right Wo Contrast  03/18/2015  CLINICAL DATA:  [place] 3-4 days ago.  Injured right upper extremity. EXAM: CT OF THE RIGHT SHOULDER WITHOUT CONTRAST; CT OF THE RIGHT HUMERUS WITHOUT CONTRAST; CT OF THE RIGHT FOREARM WITHOUT CONTRAST TECHNIQUE: Multidetector CT imaging was performed according to the standard protocol. Multiplanar CT image reconstructions were also generated. COMPARISON:  None. FINDINGS: No acute fractures are identified. The shoulder joint is maintained. No fracture or dislocation. Mild AC joint degenerative changes. No AC joint separation. No humeral fracture. There is extensive subacromial/subdeltoid and subscapularis bursitis. There is also a large glenohumeral joint effusion and severe synovitis. Dialysis grafts are noted in the right upper extremity. The elbow joint is maintained. No elbow fracture or obvious joint effusion. Headache cardiac No forearm fractures are identified. There is a moderate-sized right pleural effusion with overlying atelectasis. No obvious acute right rib fractures are identified. The thoracic vertebral bodies are intact. Moderate degenerative changes noted at the sternoclavicular joints.  Small scarred right kidney is noted. There is advanced atherosclerotic calcifications involving the aorta and branch vessels. IMPRESSION: No acute fractures of the right upper extremity are identified. The joints are maintained. Extensive synovitis, joint effusion and bursitis involving the right shoulder. Electronically Signed   By: Marijo Sanes M.D.   On: 03/18/2015 09:11   Ct Forearm Right Wo Contrast  03/18/2015  CLINICAL DATA:  [place] 3-4 days ago.  Injured right upper extremity. EXAM: CT OF THE RIGHT SHOULDER WITHOUT CONTRAST; CT OF THE RIGHT HUMERUS WITHOUT CONTRAST; CT OF THE RIGHT FOREARM WITHOUT CONTRAST TECHNIQUE: Multidetector CT imaging was performed according to the standard protocol. Multiplanar CT image reconstructions were also generated. COMPARISON:  None. FINDINGS: No acute fractures are identified. The shoulder joint is maintained. No fracture or dislocation. Mild AC joint degenerative changes. No AC joint separation. No humeral fracture. There is extensive subacromial/subdeltoid and subscapularis bursitis. There is also a large glenohumeral joint effusion and severe synovitis. Dialysis grafts are noted in the right upper extremity. The elbow joint is maintained. No elbow fracture or obvious joint effusion. Headache cardiac No forearm fractures are identified. There is a moderate-sized right pleural effusion with overlying atelectasis. No obvious acute right rib fractures are identified. The thoracic vertebral bodies are intact. Moderate degenerative changes noted at the sternoclavicular joints. Small scarred right kidney is noted. There is advanced atherosclerotic calcifications involving the aorta and branch vessels. IMPRESSION: No acute fractures of the right upper extremity are identified. The joints are maintained. Extensive synovitis, joint effusion and bursitis involving the right shoulder. Electronically Signed   By: Marijo Sanes M.D.   On: 03/18/2015 09:11   US Renal  03/15/2015   CLINICAL DATA:  Acute renal failure superimposed on stage 4 chronic kidney disease. EXAM: RENAL / URINARY TRACT ULTRASOUND COMPLETE COMPARISON:  Included portions from chest CT 02/18/2015 FINDINGS: Right Kidney: Not visualized. Left Kidney: Not visualized. Transplant kidney located in the pelvis, side not specified. There is  no transplant hydronephrosis. Blood flow seen. Transplant kidney measures 10 cm. Transplant cortical thickness appears maintained. No focal lesion. Detailed transplant Doppler evaluation not performed. Bladder: Appears normal for degree of bladder distention. IMPRESSION: 1. Native kidneys not identified, likely secondary renal atrophy. 2. Pelvic transplant kidney without hydronephrosis. Electronically Signed   By: Jeb Levering M.D.   On: 03/15/2015 23:08   Dg Chest Portable 1 View  03/14/2015  CLINICAL DATA:  Fever and weakness tonight. EXAM: PORTABLE CHEST 1 VIEW COMPARISON:  Radiograph 02/18/2015.  Chest CT 02/16/2015 FINDINGS: Cardiomediastinal contours are unchanged allowing for patient rotation. Small left pleural effusion, diminished from prior. No evidence of right pleural effusion. Left basilar scarring. No pulmonary edema. No confluent airspace disease. No pneumothorax. IMPRESSION: Persistent but decreased left pleural effusion. Exam is otherwise unchanged. Electronically Signed   By: Jeb Levering M.D.   On: 03/14/2015 02:09   Dg Fluoro Guided Needle Plc Aspiration/injection Loc  03/21/2015  CLINICAL DATA:  Sinusitis, joint effusion and bursitis involving the right shoulder on CT 03/17/2015. EXAM: RIGHT SHOULDER ASPIRATION UNDER FLUOROSCOPY TECHNIQUE: Despite an INR of 3.99, Dr. Meridee Score insisted on a relatively emergent right shoulder aspiration due to the possibility of sepsis. The increased risk of bleeding was discussed with the patient prior to the procedure, as well as the risk of infection. Patient elected to proceed with the procedure. Informed consent was  obtained. An appropriate skin entrance site was determined. The site was marked, prepped with Betadine, draped in the usual sterile fashion, and infiltrated locally with buffered Lidocaine. Based on CT imaging, 80 20 gauge spinal needle was inserted over the mid humeral head and 5-60 cc of serosanguineous fluid returned. No immediate complication. FLUOROSCOPY TIME:  Radiation Exposure Index (as provided by the fluoroscopic device): If the device does not provide the exposure index: Fluoroscopy Time (in minutes and seconds):  0 minutes 24 seconds. Number of Acquired Images:  None. FINDINGS: Despite an INR of 3.99, Dr. Meridee Score insisted on a relatively emergent right shoulder aspiration due to the possibility of sepsis. The increased risk of bleeding was discussed with the patient prior to the procedure, as well as the risk of infection. Patient elected to proceed with the procedure. Informed consent was obtained. An appropriate skin entrance site was determined. The site was marked, prepped with Betadine, draped in the usual sterile fashion, and infiltrated locally with buffered Lidocaine. Based on CT imaging, a 20 gauge spinal needle was inserted over the mid humeral head with return of 5-6 cc of serosanguineous fluid. No immediate complication. IMPRESSION: Technically successful right shoulder aspiration. Electronically Signed   By: Lorin Picket M.D.   On: 03/21/2015 15:16        Objective: Filed Vitals:   03/22/15 1536 03/22/15 2102 03/23/15 0348 03/23/15 0857  BP: 146/58 135/62 126/54 134/66  Pulse: 95 94 92 68  Temp: 98.1 F (36.7 C) 98.4 F (36.9 C) 98.2 F (36.8 C) 97.7 F (36.5 C)  TempSrc: Oral Oral Oral Oral  Resp: 22 18 19 18   Height:      Weight:  66.1 kg (145 lb 11.6 oz)    SpO2: 95% 94% 95% 97%    Intake/Output Summary (Last 24 hours) at 03/23/15 1431 Last data filed at 03/23/15 0600  Gross per 24 hour  Intake    680 ml  Output      0 ml  Net    680 ml   Weight  change:  Exam:   General:  Pt is alert, follows commands appropriately, not in acute distress.   HEENT: No icterus, No thrush, No neck mass, [place]/AT  Cardiovascular: RRR, S1/S2, no rubs, no gallops. Telemetry: Sinus rhythm.  Respiratory: CTA bilaterally, no wheezing, no crackles, no rhonchi  Abdomen: Soft/+BS, non tender, non distended, no guarding  Extremities: 1+RLE edema, No lymphangitis, No petechiae, No rashes, no synovitis. Right leg in cam boot. Right upper extremity swelling improved some. No other acute findings.  Data Reviewed: Basic Metabolic Panel:  Recent Labs Lab 03/18/15 0530 03/19/15 0553 03/20/15 1115 03/21/15 0527 03/22/15 1416  NA 134* 138 139 137 139  K 4.7 4.8 4.8 4.8 5.0  CL 103 106 105 106 103  CO2 21* 22 23 21* 23  GLUCOSE 182* 151* 118* 135* 146*  BUN 58* 59* 54* 53* 46*  CREATININE 2.69* 2.34* 2.28* 2.25* 2.34*  CALCIUM 9.6 9.4 9.8 9.1 9.8  PHOS 3.4 2.5 2.3* 2.5 2.4*   Liver Function Tests:  Recent Labs Lab 03/18/15 0530 03/19/15 0553 03/20/15 1115 03/21/15 0527 03/22/15 1416  ALBUMIN 2.2* 1.9* 2.0* 2.0* 2.1*   No results for input(s): LIPASE, AMYLASE in the last 168 hours. No results for input(s): AMMONIA in the last 168 hours. CBC:  Recent Labs Lab 03/20/15 1115 03/21/15 0527 03/22/15 1416 03/22/15 1641 03/23/15 0621  WBC 5.8 7.9 7.6 7.8 5.7  NEUTROABS  --   --   --  5.9  --   HGB 9.7* 8.8* 9.5* 8.8* 9.9*  HCT 28.9* 27.3* 28.9* 28.0* 32.1*  MCV 85.0 84.0 85.3 84.6 84.3  PLT 137* 125* 147* 136* 69*   Cardiac Enzymes:  Recent Labs Lab 03/17/15 0703 03/17/15 1211 03/17/15 1819  TROPONINI 0.04* 0.04* 0.08*   BNP: Invalid input(s): POCBNP CBG: No results for input(s): GLUCAP in the last 168 hours.  Recent Results (from the past 240 hour(s))  Blood culture (routine x 2)     Status: None   Collection Time: 03/14/15  1:15 AM  Result Value Ref Range Status   Specimen Description BLOOD BLOOD LEFT FOREARM  Final    Special Requests BOTTLES DRAWN AEROBIC AND ANAEROBIC 5CC  Final   Culture  Setup Time   Final    GRAM POSITIVE COCCI IN CLUSTERS IN BOTH AEROBIC AND ANAEROBIC BOTTLES CRITICAL RESULT CALLED TO, READ BACK BY AND VERIFIED WITH: Vivianne Spence RN 2017 03/14/15 A BROWNING    Culture   Final    ENTEROCOCCUS SPECIES SUSCEPTIBILITIES PERFORMED ON PREVIOUS CULTURE WITHIN THE LAST 5 DAYS. STAPHYLOCOCCUS SPECIES (COAGULASE NEGATIVE) THE SIGNIFICANCE OF ISOLATING THIS ORGANISM FROM A SINGLE SET OF BLOOD CULTURES WHEN MULTIPLE SETS ARE DRAWN IS UNCERTAIN. PLEASE NOTIFY THE MICROBIOLOGY DEPARTMENT WITHIN ONE WEEK IF SPECIATION AND SENSITIVITIES ARE REQUIRED.    Report Status 03/17/2015 FINAL  Final  Blood culture (routine x 2)     Status: None   Collection Time: 03/14/15  1:33 AM  Result Value Ref Range Status   Specimen Description BLOOD RIGHT WRIST  Final   Special Requests BOTTLES DRAWN AEROBIC AND ANAEROBIC 5CC   Final   Culture  Setup Time   Final    GRAM POSITIVE COCCI IN CHAINS IN BOTH AEROBIC AND ANAEROBIC BOTTLES CRITICAL RESULT CALLED TO, READ BACK BY AND VERIFIED WITH: K MOORE,RN @0144  03/15/15 MKELLY    Culture ENTEROCOCCUS SPECIES  Final   Report Status 03/17/2015 FINAL  Final   Organism ID, Bacteria ENTEROCOCCUS SPECIES  Final      Susceptibility   Enterococcus species - MIC*  AMPICILLIN <=2 SENSITIVE Sensitive     VANCOMYCIN 1 SENSITIVE Sensitive     GENTAMICIN SYNERGY SENSITIVE Sensitive     * ENTEROCOCCUS SPECIES  Culture, blood (Routine X 2) w Reflex to ID Panel     Status: None   Collection Time: 03/16/15  5:00 AM  Result Value Ref Range Status   Specimen Description BLOOD LEFT HAND  Final   Special Requests AEROBIC BOTTLE ONLY 5ML  Final   Culture  Setup Time   Final    GRAM POSITIVE COCCI IN PAIRS IN CHAINS AEROBIC BOTTLE ONLY CRITICAL RESULT CALLED TO, READ BACK BY AND VERIFIED WITH: A. LOWE,RN AT [id number] ON [id number] BY Rhea Bleacher    Culture   Final    ENTEROCOCCUS  SPECIES SUSCEPTIBILITIES PERFORMED ON PREVIOUS CULTURE WITHIN THE LAST 5 DAYS.    Report Status 03/19/2015 FINAL  Final  Culture, blood (Routine X 2) w Reflex to ID Panel     Status: None (Preliminary result)   Collection Time: 03/16/15  5:04 AM  Result Value Ref Range Status   Specimen Description BLOOD LEFT FINGER  Final   Special Requests IN PEDIATRIC BOTTLE  Final   Culture  Setup Time   Final    GRAM POSITIVE COCCI IN CLUSTERS IN PAIRS AEROBIC BOTTLE ONLY CRITICAL RESULT CALLED TO, READ BACK BY AND VERIFIED WITHBarnabas Harries RN 2321 03/18/15 A BROWNING    Culture GRAM POSITIVE COCCI IDENTIFICATION TO FOLLOW   Final   Report Status PENDING  Incomplete  Culture, blood (Routine X 2) w Reflex to ID Panel     Status: None (Preliminary result)   Collection Time: 03/18/15  8:54 AM  Result Value Ref Range Status   Specimen Description BLOOD BLOOD LEFT HAND  Final   Special Requests IN PEDIATRIC BOTTLE 2.5CC  Final   Culture NO GROWTH 4 DAYS  Final   Report Status PENDING  Incomplete  Culture, blood (Routine X 2) w Reflex to ID Panel     Status: None (Preliminary result)   Collection Time: 03/18/15  9:01 AM  Result Value Ref Range Status   Specimen Description BLOOD BLOOD LEFT HAND  Final   Special Requests IN PEDIATRIC BOTTLE 1CC  Final   Culture NO GROWTH 4 DAYS  Final   Report Status PENDING  Incomplete  Body fluid culture     Status: None (Preliminary result)   Collection Time: 03/21/15  2:00 PM  Result Value Ref Range Status   Specimen Description FLUID SYNOVIAL RIGHT SHOULDER  Final   Special Requests NONE  Final   Gram Stain   Final    ABUNDANT WBC PRESENT, PREDOMINANTLY PMN NO ORGANISMS SEEN    Culture NO GROWTH 2 DAYS  Final   Report Status PENDING  Incomplete     Scheduled Meds: . allopurinol  200 mg Oral Daily  . amLODipine  5 mg Oral Daily  . ampicillin (OMNIPEN) IV  2 g Intravenous 3 times per day  . atorvastatin  40 mg Oral q1800  . calcitRIOL  0.25 mcg Oral  QODAY  . calcitRIOL  0.5 mcg Oral QODAY  . carvedilol  25 mg Oral BID WC  . cefTRIAXone (ROCEPHIN)  IV  2 g Intravenous Q12H  . darbepoetin (ARANESP) injection - NON-DIALYSIS  100 mcg Subcutaneous Q Sat-1800  . famotidine  20 mg Oral Daily  . feeding supplement (NEPRO CARB STEADY)  237 mL Oral BID BM  . feeding supplement (PRO-STAT SUGAR FREE 64)  30 mL  Oral Daily  . furosemide  40 mg Oral Daily  . isosorbide mononitrate  90 mg Oral Daily  . latanoprost  1 drop Both Eyes QHS  . magnesium oxide  400 mg Oral BID  . mycophenolate  180 mg Oral BID  . predniSONE  5 mg Oral Q breakfast  . ranolazine  500 mg Oral BID  . sodium bicarbonate  1,300 mg Oral BID  . tacrolimus  2 mg Oral BID  . Warfarin - Pharmacist Dosing Inpatient   Does not apply q1800   Continuous Infusions:   [facility] Pager [phone]  If 7PM-7AM, please contact night-coverage [email or web address] Password [email or web address] 03/23/2015, 2:31 PM   LOS: 9 days

## 2015-03-23 NOTE — Progress Notes (Addendum)
Patient ID: Debbie [name], female   DOB: 03-13-1950, 65 y.o.   MRN: [id number]         [facility] for Infectious Disease    Date of Admission:  03/13/2015   Total days of antibiotics 10          Patient Active Problem List   Diagnosis Date Noted  . Effusion of shoulder joint     Priority: High  . Enterococcal bacteremia     Priority: High  . Pyogenic arthritis of right shoulder region ([place])   . Arm pain, right 03/17/2015  . Bacteremia   . Renal transplant recipient 03/14/2015  . Coagulopathy ([place]) 03/14/2015  . ARF (acute renal failure) ([place]) 03/14/2015  . Acute renal failure superimposed on stage 4 chronic kidney disease ([place]) 03/14/2015  . Cardiomyopathy, ischemic-40-45% 03/14/2015  . Supratherapeutic INR 03/14/2015  . LBBB (left bundle branch block) 03/14/2015  . Anemia of chronic kidney failure 03/14/2015  . Hypokalemia   . Hyperkalemia   . NSTEMI- Jan 2017- medical Rx   . Anemia in CKD (chronic kidney disease)   . Acute on chronic renal failure ([place]) 02/15/2015  . Acute systolic heart failure ([place]) 02/15/2015  . Absolute anemia   . Acute renal failure superimposed on stage 3 chronic kidney disease ([place]) 02/14/2015  . Dehydration 02/14/2015  . Chest pain 02/14/2015  . Pulmonary hypertension ([place]) 09/29/2013  . AKI (acute kidney injury) ([place]) 08/29/2013  . Dyslipidemia 07/22/2013  . Carotid bruit 07/22/2013  . Chronic anticoagulation - with Coumadin 07/06/2013  . Stable angina ([place]) 06/30/2013  . HTN (hypertension) 06/30/2013  . Diarrhea 06/21/2011  . Neutropenic fever ([place]) 04/17/2011  . Hyponatremia 04/17/2011  . CKD (chronic kidney disease), stage IV ([place]) 04/17/2011  . Kidney transplant as cause of abnormal reaction or later complication     . allopurinol  200 mg Oral Daily  . amLODipine  5 mg Oral Daily  . ampicillin (OMNIPEN) IV  2 g Intravenous 3 times per day  . atorvastatin  40 mg Oral q1800  . calcitRIOL  0.25 mcg Oral QODAY  . calcitRIOL  0.5  mcg Oral QODAY  . carvedilol  25 mg Oral BID WC  . cefTRIAXone (ROCEPHIN)  IV  2 g Intravenous Q12H  . darbepoetin (ARANESP) injection - NON-DIALYSIS  100 mcg Subcutaneous Q Sat-1800  . famotidine  20 mg Oral Daily  . feeding supplement (NEPRO CARB STEADY)  237 mL Oral BID BM  . feeding supplement (PRO-STAT SUGAR FREE 64)  30 mL Oral Daily  . furosemide  40 mg Oral Daily  . isosorbide mononitrate  90 mg Oral Daily  . latanoprost  1 drop Both Eyes QHS  . magnesium oxide  400 mg Oral BID  . mycophenolate  180 mg Oral BID  . predniSONE  5 mg Oral Q breakfast  . ranolazine  500 mg Oral BID  . sodium bicarbonate  1,300 mg Oral BID  . tacrolimus  2 mg Oral BID  . Warfarin - Pharmacist Dosing Inpatient   Does not apply q1800    SUBJECTIVE: She is feeling better. She states that her right arm and shoulder swelling are decreasing.  Review of Systems: Review of Systems  Constitutional: Negative for fever, chills and diaphoresis.  Respiratory: Negative for cough, shortness of breath and wheezing.   Cardiovascular: Negative for chest pain.  Gastrointestinal: Negative for nausea, vomiting, abdominal pain and diarrhea.  Musculoskeletal: Positive for joint pain.    Past Medical History  Diagnosis  Date  . Kidney transplant as cause of abnormal reaction or later complication   . Hypertension   . Colon polyps   . Gall stones 1980  . 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 Paroxysmal atrial fibrillation (HCC)   . Carpal tunnel syndrome 2003    lt  . Esophageal stricture   . Hemorrhoids   . Renal disorder   . ESRD (end stage renal disease) on dialysis [facility])     "til I got my transplant in 2010"  . DVT (deep venous thrombosis) (HCC) 1990    LLE  . History of blood transfusion     "related to the lupus"  . Dyslipidemia 07/22/2013  . Coronary artery disease   . Heart murmur     mild MR on echo  . Pneumonia     "3 times now"  (08/25/2013)  . GERD (gastroesophageal reflux disease)   . Arthritis     "fingers" (08/25/2013)  . Glaucoma, left eye   . Bilateral carotid artery stenosis     1-39%  . Pulmonary HTN ([place])     PASP 64mmHg    Social History  Substance Use Topics  . Smoking status: Former Smoker -- 0.00 packs/day for 30 years    Types: Cigarettes    Quit date: 04/16/1977  . Smokeless tobacco: Never Used  . Alcohol Use: Yes    Family History  Problem Relation Age of Onset  . Hypertension Mother   . Heart disease Mother   . Colon cancer Neg Hx   . Rectal cancer Neg Hx   . Stomach cancer Neg Hx    Allergies  Allergen Reactions  . Feraheme [Ferumoxytol] Other (See Comments)    Chest pain, pulsating 02/17/15: tolerated Nulecit  . Vancomycin Anaphylaxis, Itching, Swelling and Other (See Comments)    Tongue swell  . Dorzolamide Hcl-Timolol Mal Rash and Other (See Comments)    Eye pain  . Gentamycin [Gentamicin Sulfate] Itching and Swelling  . Latanoprost Rash and Other (See Comments)    Eye pain  . Cefazolin Itching  . Codeine Itching  . Hydrocodone-Acetaminophen Itching  . Penicillins Itching    *tolerated ampicillin during 02/2015 admission Has patient had a PCN reaction causing immediate rash, facial/tongue/throat swelling, SOB or lightheadedness with hypotension:No Has patient had a PCN reaction causing severe rash involving mucus membranes or skin necrosis:No Has patient had a PCN reaction that required hospitalization:No Has patient had a PCN reaction occurring within the last 10 years:No If all of the above answers are "NO", then may proceed with Cephalosporin use.     OBJECTIVE: Filed Vitals:   03/22/15 1536 03/22/15 2102 03/23/15 0348 03/23/15 0857  BP: 146/58 135/62 126/54 134/66  Pulse: 95 94 92 68  Temp: 98.1 F (36.7 C) 98.4 F (36.9 C) 98.2 F (36.8 C) 97.7 F (36.5 C)  TempSrc: Oral Oral Oral Oral  Resp: 22 18 19 18   Height:      Weight:  145 lb 11.6 oz (66.1 kg)      SpO2: 95% 94% 95% 97%   Body mass index is 27.55 kg/(m^2).  Physical Exam  Constitutional: She is oriented to person, place, and time.  HENT:  Mouth/Throat: No oropharyngeal exudate.  Eyes: Conjunctivae are normal.  Cardiovascular: Normal rate and regular rhythm.   No murmur heard. Pulmonary/Chest: Breath sounds normal.  Abdominal: Soft. There is no tenderness.  Musculoskeletal:  She has diffuse swelling from her right  shoulder down her arm to her hand. There is no unusual redness or warmth and is not acutely tender with palpation or range of motion.  Neurological: She is alert and oriented to person, place, and time.  Skin: No rash noted.  Psychiatric: Mood and affect normal.    Lab Results Lab Results  Component Value Date   WBC 5.7 03/23/2015   HGB 9.9* 03/23/2015   HCT 32.1* 03/23/2015   MCV 84.3 03/23/2015   PLT 69* 03/23/2015    Lab Results  Component Value Date   CREATININE 2.34* 03/22/2015   BUN 46* 03/22/2015   NA 139 03/22/2015   K 5.0 03/22/2015   CL 103 03/22/2015   CO2 23 03/22/2015    Lab Results  Component Value Date   ALT 9* 02/15/2015   AST 18 02/15/2015   ALKPHOS 86 02/15/2015   BILITOT 0.9 02/15/2015     Microbiology: Recent Results (from the past 240 hour(s))  Blood culture (routine x 2)     Status: None   Collection Time: 03/14/15  1:15 AM  Result Value Ref Range Status   Specimen Description BLOOD BLOOD LEFT FOREARM  Final   Special Requests BOTTLES DRAWN AEROBIC AND ANAEROBIC 5CC  Final   Culture  Setup Time   Final    GRAM POSITIVE COCCI IN CLUSTERS IN BOTH AEROBIC AND ANAEROBIC BOTTLES CRITICAL RESULT CALLED TO, READ BACK BY AND VERIFIED WITH: Vivianne Spence RN 2017 03/14/15 A BROWNING    Culture   Final    ENTEROCOCCUS SPECIES SUSCEPTIBILITIES PERFORMED ON PREVIOUS CULTURE WITHIN THE LAST 5 DAYS. STAPHYLOCOCCUS SPECIES (COAGULASE NEGATIVE) THE SIGNIFICANCE OF ISOLATING THIS ORGANISM FROM A SINGLE SET OF BLOOD CULTURES WHEN MULTIPLE  SETS ARE DRAWN IS UNCERTAIN. PLEASE NOTIFY THE MICROBIOLOGY DEPARTMENT WITHIN ONE WEEK IF SPECIATION AND SENSITIVITIES ARE REQUIRED.    Report Status 03/17/2015 FINAL  Final  Blood culture (routine x 2)     Status: None   Collection Time: 03/14/15  1:33 AM  Result Value Ref Range Status   Specimen Description BLOOD RIGHT WRIST  Final   Special Requests BOTTLES DRAWN AEROBIC AND ANAEROBIC 5CC   Final   Culture  Setup Time   Final    GRAM POSITIVE COCCI IN CHAINS IN BOTH AEROBIC AND ANAEROBIC BOTTLES CRITICAL RESULT CALLED TO, READ BACK BY AND VERIFIED WITH: K MOORE,RN @0144  03/15/15 MKELLY    Culture ENTEROCOCCUS SPECIES  Final   Report Status 03/17/2015 FINAL  Final   Organism ID, Bacteria ENTEROCOCCUS SPECIES  Final      Susceptibility   Enterococcus species - MIC*    AMPICILLIN <=2 SENSITIVE Sensitive     VANCOMYCIN 1 SENSITIVE Sensitive     GENTAMICIN SYNERGY SENSITIVE Sensitive     * ENTEROCOCCUS SPECIES  Culture, blood (Routine X 2) w Reflex to ID Panel     Status: None   Collection Time: 03/16/15  5:00 AM  Result Value Ref Range Status   Specimen Description BLOOD LEFT HAND  Final   Special Requests AEROBIC BOTTLE ONLY 5ML  Final   Culture  Setup Time   Final    GRAM POSITIVE COCCI IN PAIRS IN CHAINS AEROBIC BOTTLE ONLY CRITICAL RESULT CALLED TO, READ BACK BY AND VERIFIED WITH: A. LOWE,RN AT [id number] ON [id number] BY Rhea Bleacher    Culture   Final    ENTEROCOCCUS SPECIES SUSCEPTIBILITIES PERFORMED ON PREVIOUS CULTURE WITHIN THE LAST 5 DAYS.    Report Status 03/19/2015 FINAL  Final  Culture, blood (  Routine X 2) w Reflex to ID Panel     Status: None   Collection Time: 03/16/15  5:04 AM  Result Value Ref Range Status   Specimen Description BLOOD LEFT FINGER  Final   Special Requests IN PEDIATRIC BOTTLE  Final   Culture  Setup Time   Final    GRAM POSITIVE COCCI IN CLUSTERS IN PAIRS AEROBIC BOTTLE ONLY CRITICAL RESULT CALLED TO, READ BACK BY AND VERIFIED WITHBarnabas Harries RN  [id number] 03/18/15 A BROWNING    Culture   Final    GRANULICATELLA ELEGANS THIS IS A NUTRTIONALLY VARIANT STREPTOCOCCUS SENSITIVITIES ARE NOT PERFORMED NOTIFIED DR. Hartford Poli AT 1400 ON [id number] BY Rhea Bleacher    Report Status 03/23/2015 FINAL  Final  Culture, blood (Routine X 2) w Reflex to ID Panel     Status: None   Collection Time: 03/18/15  8:54 AM  Result Value Ref Range Status   Specimen Description BLOOD BLOOD LEFT HAND  Final   Special Requests IN PEDIATRIC BOTTLE 2.5CC  Final   Culture NO GROWTH 5 DAYS  Final   Report Status 03/23/2015 FINAL  Final  Culture, blood (Routine X 2) w Reflex to ID Panel     Status: None   Collection Time: 03/18/15  9:01 AM  Result Value Ref Range Status   Specimen Description BLOOD BLOOD LEFT HAND  Final   Special Requests IN PEDIATRIC BOTTLE 1CC  Final   Culture NO GROWTH 5 DAYS  Final   Report Status 03/23/2015 FINAL  Final  Body fluid culture     Status: None (Preliminary result)   Collection Time: 03/21/15  2:00 PM  Result Value Ref Range Status   Specimen Description FLUID SYNOVIAL RIGHT SHOULDER  Final   Special Requests NONE  Final   Gram Stain   Final    ABUNDANT WBC PRESENT, PREDOMINANTLY PMN NO ORGANISMS SEEN    Culture NO GROWTH 2 DAYS  Final   Report Status PENDING  Incomplete     ASSESSMENT: She had enterococcal bacteremia. Repeat blood cultures are negative and there is no evidence of endocarditis by transesophageal echocardiogram. CT scan showed a large fluid collection around her right shoulder. 5-6 mL of serosanguineous fluid was aspirated on 03/21/2015. Gram stain showed no organisms and cultures are negative to date but may be falsely negative due to the already been on antibiotics. I will plan on 3 weeks of IV antibiotic therapy but will Use ampicillin alone since she does not appear to have endocarditis. We'll need to ask interventional radiology to place a central line.  PLAN: 1. Continue ampicillin for 11 more days  Michel Bickers, MD [facility] for [place] [phone] pager   [phone] cell 03/23/2015, 4:10 PM

## 2015-03-23 NOTE — Progress Notes (Signed)
ANTICOAGULATION CONSULT NOTE  Pharmacy Consult for Warfarin  Indication: atrial fibrillation  Patient Measurements: Height: 5\' 1"  (154.9 cm) Weight: 145 lb 11.6 oz (66.1 kg) IBW/kg (Calculated) : 47.8  Vital Signs: Temp: 98.2 F (36.8 C) (03/01 0348) Temp Source: Oral (03/01 0348) BP: 126/54 mmHg (03/01 0348) Pulse Rate: 92 (03/01 0348)  Labs:  Recent Labs  03/20/15 1115 03/21/15 0527 03/22/15 1416 03/22/15 1641 03/23/15 0621  HGB 9.7* 8.8* 9.5* 8.8* 9.9*  HCT 28.9* 27.3* 28.9* 28.0* 32.1*  PLT 137* 125* 147* 136* PENDING  LABPROT 34.8* 37.9* 37.8*  --  35.9*  INR 3.56* 3.99* 3.97*  --  3.70*  CREATININE 2.28* 2.25* 2.34*  --   --     Estimated Creatinine Clearance: 20.8 mL/min (by C-G formula based on Cr of 2.34).   Assessment: 46 YOF s/p kidney transplant here with dizziness. She was recenetly discharged after episode of AKI and NSTEMI which was treated conservatively. She is on warfarin PTA for hypercoagulable state and hx DVT. INR was ~8 on 2/17 in the anti-coag clinic and instructions were to hold doses, admit INR was also high at 8.43.  Vitamin K 2.5mg  po x1 given 2/20, she was also transfused on 2/21. INR was at goal and she received doses on 2/23, 2/24 and 2/25. INR is now elevated- today 3.7. Elevation in INR most likely d/t antibiotics' effect.  Hgb 9.9, Plts still pending this morning, but have been overall stable-  no bleeding noted. Received a dose of Aranesp subQ on Saturday 2/25, iron is contraindicated at this time given bacteremia.   Goal of Therapy:  INR 2-3 Monitor platelets by anticoagulation protocol: Yes   Plan:  -Hold warfarin tonight  -Daily PT/INR -Monitor for bleeding  Debbie [name] D. Debbie [name], PharmD, BCPS Clinical Pharmacist Pager: [phone] 03/23/2015 8:32 AM

## 2015-03-23 NOTE — Progress Notes (Signed)
Nutrition Follow-up  DOCUMENTATION CODES:   Not applicable  INTERVENTION:  Continue Nepro Shake po BID, each supplement provides 425 kcal and 19 grams protein.  Provide 30 ml Prostat po BID, each supplement provides 100 kcal and 15 grams of protein.   Encourage adequate PO intake.   NUTRITION DIAGNOSIS:   Inadequate oral intake related to poor appetite as evidenced by per patient/family report, meal completion < 50%; ongoing  GOAL:   Patient will meet greater than or equal to 90% of their needs; met  MONITOR:   PO intake, Supplement acceptance, Labs, Weight trends  REASON FOR ASSESSMENT:   Malnutrition Screening Tool    ASSESSMENT:   65 y.o. female with hx of renal transplant with stage III renal disease, who was recently admitted for acute on chronic renal disease with non-ST elevation MI.  Pt presents complaining of dizziness and weakness since yesterday morning. States that over the last few days pt has not been eating well. Denies any vomiting, abdominal pain, but has had some constipation.  Meal completion has been varied from 20-100%. Intake has been improving. Pt currently has Nepro shake and Prostat ordered and has been consuming them. RD to continue with current orders. Pt was encouraged to eat her food at meals and to drink her supplements.   Labs and medications reviewed.   Diet Order:  Diet regular Room service appropriate?: Yes; Fluid consistency:: Thin  Skin:  Wound (see comment) (wound on sacrum)  Last BM:  3/1  Height:   Ht Readings from Last 1 Encounters:  03/14/15 5' 1"  (1.549 m)    Weight:   Wt Readings from Last 1 Encounters:  03/22/15 145 lb 11.6 oz (66.1 kg)    Ideal Body Weight:  47.7 kg  BMI:  Body mass index is 27.55 kg/(m^2).  Estimated Nutritional Needs:   Kcal:  1650-1850  Protein:  75-85 grams  Fluid:  1.2 L/day  EDUCATION NEEDS:   Education needs addressed  Corrin Parker, MS, RD, LDN Pager # [phone] After  hours/ weekend pager # [phone]

## 2015-03-23 NOTE — Progress Notes (Deleted)
Antibiotic CONSULT NOTE - Follow Up Consult  Pharmacy Consult for Vancomycin Indication: bacteremia  Allergies  Allergen Reactions  . Feraheme [Ferumoxytol] Other (See Comments)    Chest pain, pulsating 02/17/15: tolerated Nulecit  . Vancomycin Anaphylaxis, Itching, Swelling and Other (See Comments)    Tongue swell  . Dorzolamide Hcl-Timolol Mal Rash and Other (See Comments)    Eye pain  . Gentamycin [Gentamicin Sulfate] Itching and Swelling  . Latanoprost Rash and Other (See Comments)    Eye pain  . Cefazolin Itching  . Codeine Itching  . Hydrocodone-Acetaminophen Itching  . Penicillins Itching    *tolerated ampicillin during 02/2015 admission Has patient had a PCN reaction causing immediate rash, facial/tongue/throat swelling, SOB or lightheadedness with hypotension:No Has patient had a PCN reaction causing severe rash involving mucus membranes or skin necrosis:No Has patient had a PCN reaction that required hospitalization:No Has patient had a PCN reaction occurring within the last 10 years:No If all of the above answers are "NO", then may proceed with Cephalosporin use.     Patient Measurements: Height: 5\' 1"  (154.9 cm) Weight: 145 lb 11.6 oz (66.1 kg) IBW/kg (Calculated) : 47.8  Vital Signs: Temp: 97.7 F (36.5 C) (03/01 0857) Temp Source: Oral (03/01 0857) BP: 134/66 mmHg (03/01 0857) Pulse Rate: 68 (03/01 0857)  Labs:  Recent Labs  03/21/15 0527 03/22/15 1416 03/22/15 1641 03/23/15 0621  HGB 8.8* 9.5* 8.8* 9.9*  HCT 27.3* 28.9* 28.0* 32.1*  PLT 125* 147* 136* 69*  LABPROT 37.9* 37.8*  --  35.9*  INR 3.99* 3.97*  --  3.70*  CREATININE 2.25* 2.34*  --   --     Estimated Creatinine Clearance: 20.8 mL/min (by C-G formula based on Cr of 2.34).  Assessment:  ID: bacteremia. WBC nml, afeb. Abx changed back to Vanc by ID for ease of abx for 11 more days.Echo and TEE did not show vegetations. Shoulder aspirated- possible she has septic joint  Zyvox 2/20  x1 Dapto 2/21>>2/23 Ampicillin 2/23>>3/1 CTX 2/25>>3/1 Vanco 3/1>>  2/20 BCx: 2/2 enterococcus, pan sens 2/22 BCx: 2/2 enterococcus (only 1/2 speciated- other reincubated), GRANULICATELLA ELEGANS , THIS IS A NUTRTIONALLY VARIANT STREPTOCOCCUS  2/24 BCx: negative 2/28 synovial fluid, shoulder: ngtd  Goal of Therapy:  Vanco trough 15-20    Plan:  Vancomycin 750mg  Iv q24h. Would check trough after 3-5 doses at [facility]. Vancomycin trough after 3-5 doses at steady state.  Wilder Kurowski S. Alford Highland, PharmD, BCPS Clinical Staff Pharmacist Pager [phone]  Eilene Ghazi Stillinger 03/23/2015,4:29 PM

## 2015-03-23 NOTE — Progress Notes (Signed)
   No evidence of SBE on TEE.  No plans for further cardiac work up.  Please call with any questions.

## 2015-03-24 ENCOUNTER — Other Ambulatory Visit: Payer: Self-pay | Admitting: Orthopedic Surgery

## 2015-03-24 DIAGNOSIS — I272 Other secondary pulmonary hypertension: Secondary | ICD-10-CM

## 2015-03-24 DIAGNOSIS — A499 Bacterial infection, unspecified: Secondary | ICD-10-CM

## 2015-03-24 DIAGNOSIS — Z940 Kidney transplant status: Secondary | ICD-10-CM

## 2015-03-24 LAB — CBC
HEMATOCRIT: 27.6 % — AB (ref 36.0–46.0)
HEMOGLOBIN: 9.2 g/dL — AB (ref 12.0–15.0)
MCH: 28.5 pg (ref 26.0–34.0)
MCHC: 33.3 g/dL (ref 30.0–36.0)
MCV: 85.4 fL (ref 78.0–100.0)
Platelets: 150 10*3/uL (ref 150–400)
RBC: 3.23 MIL/uL — ABNORMAL LOW (ref 3.87–5.11)
RDW: 17.7 % — ABNORMAL HIGH (ref 11.5–15.5)
WBC: 7.2 10*3/uL (ref 4.0–10.5)

## 2015-03-24 LAB — PROTIME-INR
INR: 3.49 — ABNORMAL HIGH (ref 0.00–1.49)
PROTHROMBIN TIME: 34.3 s — AB (ref 11.6–15.2)

## 2015-03-24 MED ORDER — SODIUM CHLORIDE 0.9 % IV SOLN
Freq: Once | INTRAVENOUS | Status: AC
  Administered 2015-03-24: 14:00:00 via INTRAVENOUS

## 2015-03-24 MED ORDER — VITAMIN K1 10 MG/ML IJ SOLN
5.0000 mg | Freq: Once | INTRAVENOUS | Status: AC
  Administered 2015-03-24: 5 mg via INTRAVENOUS
  Filled 2015-03-24: qty 0.5

## 2015-03-24 NOTE — Clinical Social Work Note (Signed)
Clinical Social Work Assessment  Patient Details  Name: Debbie [name] MRN: [id number] Date of Birth: 1950-09-03  Date of referral:  03/24/15               Reason for consult:  Facility Placement, Discharge Planning                Permission sought to share information with:  Facility Sport and exercise psychologist Permission granted to share information::  Yes, Verbal Permission Granted  Name::      [name] and [name] [phone]  Agency::     Relationship::  Daughters  Contact Information:  [phone] Flatten)    [phone] Tamala Julian)  Housing/Transportation Living arrangements for the past 2 months:  [place] of Information:  Patient Patient Interpreter Needed:  None Criminal Activity/Legal Involvement Pertinent to Current Situation/Hospitalization:  No - Comment as needed Significant Relationships:  Adult Children Lives with:  Self Do you feel safe going back to the place where you live?  Yes Need for family participation in patient care:  Yes (Comment)  Care giving concerns:  Not disucssed   Social Worker assessment / plan:  CSW intern spoke with patient regarding her discharge plans. [name] stated that she agreed to go to a SNF to receive short term rehab. Patient was provided with a list of facilities in the area. [name] was informed on the process of sending out her information to facilities and had no further questions or concerns at this time.   Employment status:  Retired Forensic scientist:  [employer] PT Recommendations:  [place] / Referral to community resources:  [place]  Patient/Family's Response to care:  Not discussed   Patient/Family's Understanding of and Emotional Response to Diagnosis, Current Treatment, and Prognosis:  Not discussed   Emotional Assessment Appearance:  Appears stated age Attitude/Demeanor/Rapport:    Affect (typically observed):  Accepting, Appropriate, Calm,  Hopeful Orientation:  Oriented to Self, Oriented to Place, Oriented to  Time, Oriented to Situation Alcohol / Substance use:  Tobacco Use (reports that she quit smoking about 37 years ago. Her smoking use included Cigarettes. She smoked 0.00 packs per day for 30 years. She has never used smokeless tobacco. She reports that she drinks alcohol. She reports that she does not use illicit drugs.) Psych involvement (Current and /or in the community):  No (Comment)  Discharge Needs  Concerns to be addressed:  Discharge Planning Concerns Readmission within the last 30 days:  No Current discharge risk:  Other (Deconditioned ) Barriers to Discharge:  No Barriers Identified   Radonna Ricker, Student-SW 03/24/2015, 4:29 PM

## 2015-03-24 NOTE — Progress Notes (Signed)
Patient ID: Debbie [name], female   DOB: 12-13-1950, 65 y.o.   MRN: [id number]         [facility]    Date of Admission:  03/13/2015   Total days of antibiotics 11          Patient Active Problem List   Diagnosis Date Noted  . Effusion of shoulder joint     Priority: High  . Enterococcal bacteremia     Priority: High  . Pyogenic arthritis of right shoulder region ([place])   . Arm pain, right 03/17/2015  . Bacteremia   . Renal transplant recipient 03/14/2015  . Coagulopathy ([place]) 03/14/2015  . ARF (acute renal failure) ([place]) 03/14/2015  . Acute renal failure superimposed on stage 4 chronic kidney disease ([place]) 03/14/2015  . Cardiomyopathy, ischemic-40-45% 03/14/2015  . Supratherapeutic INR 03/14/2015  . LBBB (left bundle branch block) 03/14/2015  . Anemia of chronic kidney failure 03/14/2015  . Hypokalemia   . Hyperkalemia   . NSTEMI- Jan 2017- medical Rx   . Anemia in CKD (chronic kidney disease)   . Acute on chronic renal failure ([place]) 02/15/2015  . Acute systolic heart failure ([place]) 02/15/2015  . Absolute anemia   . Acute renal failure superimposed on stage 3 chronic kidney disease ([place]) 02/14/2015  . Dehydration 02/14/2015  . Chest pain 02/14/2015  . Pulmonary hypertension ([place]) 09/29/2013  . AKI (acute kidney injury) ([place]) 08/29/2013  . Dyslipidemia 07/22/2013  . Carotid bruit 07/22/2013  . Chronic anticoagulation - with Coumadin 07/06/2013  . Stable angina ([place]) 06/30/2013  . HTN (hypertension) 06/30/2013  . Diarrhea 06/21/2011  . Neutropenic fever ([place]) 04/17/2011  . Hyponatremia 04/17/2011  . CKD (chronic kidney disease), stage IV ([place]) 04/17/2011  . Kidney transplant as cause of abnormal reaction or later complication     . allopurinol  200 mg Oral Daily  . amLODipine  5 mg Oral Daily  . ampicillin (OMNIPEN) IV  2 g Intravenous 3 times per day  . atorvastatin  40 mg Oral q1800  . calcitRIOL  0.25 mcg Oral QODAY  . calcitRIOL  0.5  mcg Oral QODAY  . carvedilol  25 mg Oral BID WC  . darbepoetin (ARANESP) injection - NON-DIALYSIS  100 mcg Subcutaneous Q Sat-1800  . famotidine  20 mg Oral Daily  . feeding supplement (NEPRO CARB STEADY)  237 mL Oral BID BM  . feeding supplement (PRO-STAT SUGAR FREE 64)  30 mL Oral Daily  . furosemide  40 mg Oral Daily  . isosorbide mononitrate  90 mg Oral Daily  . latanoprost  1 drop Both Eyes QHS  . magnesium oxide  400 mg Oral BID  . mycophenolate  180 mg Oral BID  . predniSONE  5 mg Oral Q breakfast  . ranolazine  500 mg Oral BID  . sodium bicarbonate  1,300 mg Oral BID  . tacrolimus  2 mg Oral BID  . Warfarin - Pharmacist Dosing Inpatient   Does not apply q1800    SUBJECTIVE: She states that her right arm swelling and pain is better. She tells me that she has been having some diarrhea. She is very concerned about being told that she should have a central line placed. She does not want to go to a skilled nursing facility. She is worried how she will fourth intact. She states that she wants to think about all of this before having a new IV placed.  Review of Systems: Review of Systems  Constitutional: Negative  for fever, chills and diaphoresis.  Respiratory: Negative for cough, shortness of breath and wheezing.   Cardiovascular: Negative for chest pain.  Gastrointestinal: Positive for diarrhea. Negative for nausea, vomiting and abdominal pain.  Musculoskeletal: Positive for joint pain.    Past Medical History  Diagnosis Date  . Kidney transplant as cause of abnormal reaction or later complication   . Hypertension   . Colon polyps   . Gall stones 1980  . 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 Paroxysmal atrial fibrillation (HCC)   . Carpal tunnel syndrome 2003    lt  . Esophageal stricture   . Hemorrhoids   . Renal disorder   . ESRD (end stage renal disease) on dialysis [facility])     "til I got my transplant in  2010"  . DVT (deep venous thrombosis) (HCC) 1990    LLE  . History of blood transfusion     "related to the lupus"  . Dyslipidemia 07/22/2013  . Coronary artery disease   . Heart murmur     mild MR on echo  . Pneumonia     "3 times now" (08/25/2013)  . GERD (gastroesophageal reflux disease)   . Arthritis     "fingers" (08/25/2013)  . Glaucoma, left eye   . Bilateral carotid artery stenosis     1-39%  . Pulmonary HTN ([place])     PASP 48mmHg    Social History  Substance Use Topics  . Smoking status: Former Smoker -- 0.00 packs/day for 30 years    Types: Cigarettes    Quit date: 04/16/1977  . Smokeless tobacco: Never Used  . Alcohol Use: Yes    Family History  Problem Relation Age of Onset  . Hypertension Mother   . Heart disease Mother   . Colon cancer Neg Hx   . Rectal cancer Neg Hx   . Stomach cancer Neg Hx    Allergies  Allergen Reactions  . Feraheme [Ferumoxytol] Other (See Comments)    Chest pain, pulsating 02/17/15: tolerated Nulecit  . Vancomycin Anaphylaxis, Itching, Swelling and Other (See Comments)    Tongue swell  . Dorzolamide Hcl-Timolol Mal Rash and Other (See Comments)    Eye pain  . Gentamycin [Gentamicin Sulfate] Itching and Swelling  . Latanoprost Rash and Other (See Comments)    Eye pain  . Cefazolin Itching  . Codeine Itching  . Hydrocodone-Acetaminophen Itching  . Penicillins Itching    *tolerated ampicillin during 02/2015 admission Has patient had a PCN reaction causing immediate rash, facial/tongue/throat swelling, SOB or lightheadedness with hypotension:No Has patient had a PCN reaction causing severe rash involving mucus membranes or skin necrosis:No Has patient had a PCN reaction that required hospitalization:No Has patient had a PCN reaction occurring within the last 10 years:No If all of the above answers are "NO", then may proceed with Cephalosporin use.     OBJECTIVE: Filed Vitals:   03/23/15 2135 03/24/15 0420 03/24/15 0500 03/24/15  0920  BP: 129/53 130/47 134/64 143/57  Pulse: 74 77 88 79  Temp: 98.6 F (37 C)  98.8 F (37.1 C) 98.9 F (37.2 C)  TempSrc: Oral  Oral Oral  Resp: 18  18 18   Height:      Weight:   147 lb 11.3 oz (67 kg)   SpO2: 96%  97% 98%   Body mass index is 27.92 kg/(m^2).  Physical Exam  Constitutional: She is oriented to person, place, and time.  She appears very concerned but otherwise in no distress. She is eating breakfast.  Eyes: Conjunctivae are normal.  Cardiovascular: Normal rate and regular rhythm.   No murmur heard. Pulmonary/Chest: Breath sounds normal.  Abdominal: Soft. There is no tenderness.  Musculoskeletal:  No change in the diffuse swelling of her right arm and shoulder.  Neurological: She is alert and oriented to person, place, and time.  Skin: No rash noted.    Lab Results Lab Results  Component Value Date   WBC 7.2 03/24/2015   HGB 9.2* 03/24/2015   HCT 27.6* 03/24/2015   MCV 85.4 03/24/2015   PLT 150 03/24/2015    Lab Results  Component Value Date   CREATININE 2.34* 03/22/2015   BUN 46* 03/22/2015   NA 139 03/22/2015   K 5.0 03/22/2015   CL 103 03/22/2015   CO2 23 03/22/2015    Lab Results  Component Value Date   ALT 9* 02/15/2015   AST 18 02/15/2015   ALKPHOS 86 02/15/2015   BILITOT 0.9 02/15/2015     Microbiology: Recent Results (from the past 240 hour(s))  Culture, blood (Routine X 2) w Reflex to ID Panel     Status: None   Collection Time: 03/16/15  5:00 AM  Result Value Ref Range Status   Specimen Description BLOOD LEFT HAND  Final   Special Requests AEROBIC BOTTLE ONLY 5ML  Final   Culture  Setup Time   Final    GRAM POSITIVE COCCI IN PAIRS IN CHAINS AEROBIC BOTTLE ONLY CRITICAL RESULT CALLED TO, READ BACK BY AND VERIFIED WITH: A. LOWE,RN AT [id number] ON [id number] BY Rhea Bleacher    Culture   Final    ENTEROCOCCUS SPECIES SUSCEPTIBILITIES PERFORMED ON PREVIOUS CULTURE WITHIN THE LAST 5 DAYS.    Report Status 03/19/2015 FINAL  Final    Culture, blood (Routine X 2) w Reflex to ID Panel     Status: None   Collection Time: 03/16/15  5:04 AM  Result Value Ref Range Status   Specimen Description BLOOD LEFT FINGER  Final   Special Requests IN PEDIATRIC BOTTLE  Final   Culture  Setup Time   Final    GRAM POSITIVE COCCI IN CLUSTERS IN PAIRS AEROBIC BOTTLE ONLY CRITICAL RESULT CALLED TO, READ BACK BY AND VERIFIED WITHBarnabas Harries RN [id number] 03/18/15 A BROWNING    Culture   Final    GRANULICATELLA ELEGANS THIS IS A NUTRTIONALLY VARIANT STREPTOCOCCUS SENSITIVITIES ARE NOT PERFORMED NOTIFIED DR. Hartford Poli AT [id number] ON [id number] BY Rhea Bleacher    Report Status 03/23/2015 FINAL  Final  Culture, blood (Routine X 2) w Reflex to ID Panel     Status: None   Collection Time: 03/18/15  8:54 AM  Result Value Ref Range Status   Specimen Description BLOOD BLOOD LEFT HAND  Final   Special Requests IN PEDIATRIC BOTTLE 2.5CC  Final   Culture NO GROWTH 5 DAYS  Final   Report Status 03/23/2015 FINAL  Final  Culture, blood (Routine X 2) w Reflex to ID Panel     Status: None   Collection Time: 03/18/15  9:01 AM  Result Value Ref Range Status   Specimen Description BLOOD BLOOD LEFT HAND  Final   Special Requests IN PEDIATRIC BOTTLE [place]  Final   Culture NO GROWTH 5 DAYS  Final   Report Status 03/23/2015 FINAL  Final  Body fluid culture     Status: None (Preliminary result)   Collection Time: 03/21/15  2:00 PM  Result  Value Ref Range Status   Specimen Description FLUID SYNOVIAL RIGHT SHOULDER  Final   Special Requests NONE  Final   Gram Stain   Final    ABUNDANT WBC PRESENT, PREDOMINANTLY PMN NO ORGANISMS SEEN    Culture NO GROWTH 2 DAYS  Final   Report Status PENDING  Incomplete     ASSESSMENT: She had enterococcal bacteremia (1 blood culture also grew a nutritionally variant strep species called Granulicatella) over a three-day period. Although her right shoulder aspirate cultures remain negative I'm still concerned about septic arthritis.  Her culture may be falsely negative because of prior antibiotic therapy. I am not comfortable switching her to oral antibiotic therapy but she is very concerned about needing to complete 10 more days of IV therapy. I will check stool studies for possible C. difficile colitis.  PLAN: 1. Continue ampicillin for 10 more days 2. She states that she will think about whether or not she will allow [place] to place a central line  Michel Bickers, MD [facility] [place] [phone] pager   [phone] cell 03/24/2015, 11:20 AM

## 2015-03-24 NOTE — Care Management Important Message (Signed)
Important Message  Patient Details  Name: Debbie [name] MRN: [id number] Date of Birth: Jun 02, 1950   Medicare Important Message Given:  Yes    Giabella Duhart, Rory Percy, RN 03/24/2015, 12:19 PM

## 2015-03-24 NOTE — Progress Notes (Signed)
PROGRESS NOTE  Debbie [name] [id number] DOB: 06-06-1950 DOA: 03/13/2015 PCP: No PCP Per Patient   Subjective: Denies any fever or chills, continues to have a right upper extremity swelling.  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 Jacques Navy appointment 02/16/15) presented to [facility] with 1 day history of dizziness and generalized weakness. The patient was recently discharged from the hospital on 02/21/2015 after treatment for NSTEMI and acute on chronic renal failure. The patient states that she has had poor by mouth intake for the past 2-3 days with constipation. Upon presentation, she was noted to have a serum creatinine of 4.31. The patient received 2 L bolus of fluid in the emergency department. She was also found to have INR of 8.14. On the afternoon of 03/14/2015, the patient developed substernal chest pain for which she received nitroglycerin with relief. Cardiology was consulted. EKG showed a new LBBB.  Assessment/Plan:  Acute on chronic renal failure (CKD 4) in pt with renal transplant -Due to volume depletion and infection -Restarted judicious IVF -Baseline creatinine 2.2-2.3 -Patient endorses compliance with all her medications including immunosuppressive therapy - Renal ultrasound: Native kidneys not identified possibly due to atrophy. Transplanted kidney without hydronephrosis. - Creatinine was 2.2 on 1/30. Admitted with creatinine of 4.31 and is  gradually improving (3.41 on 2/23). - Requested nephrology consultation given complex renal issues and patient wishes to speak with them prior to deciding on TEE. - Her BMP in a.m.  Right shoulder septic arthritis -Enterococcus acute septic arthritis, right shoulder has swelling growing enterococcus. -Reported to Dr. Sharol Given, she will probably have to have I&D in a.m.  Mild hyperkalemia - Resolved after a dose of Kayexalate.  Enterococcus bacteremia -source unclear. 2/20: 1/2 blood cultures positive for enterococcus. 2nd blood culture positive for enterococcus and Coag neg staphylococcus. 2/22: Blood culture 2: Also positive - pt has anaphylaxis to vancomycin - Infectious disease follow-up appreciated. ID recommends TEE - patient had been contemplating and reluctant all along but has finally consented to having it on 2/26. - Cubicin changed to IV ampicillin & Rocephin. Surveillance blood culture(2/24)  neg to date.  - CT right upper extremity shows right shoulder effusion. - S/P aspiration of the right shoulder, G stain showed abundant WBC. No WBC count.  Chest pain and new LBBB on EKG -Concerning for angina with new left bundle branch block -relieved after SL NTG x 1 -EKG--new LBBB -pt had NSTEMI last admission in Jan 2017 -02/15/2015 echo EF 40-45%, PAP 60 -continue ASA -Continue imdur/Ranexa -defer Ranexa pharmacy concern to cardiology -elevated troponins likely due to renal failure - Cardiology follow-up appreciated: Suspect anemia contribute it to her angina. Continue carvedilol, Lipitor, Imdur and Ranexa. Cardiology does not recommend any further workup and had signed off 2/22. However due to recurrent NTG responsive chest pain, cardiology has seen again on 2/23. EKG without acute changes and no LBBB at this time (LBBB is intermittent).  -Cardiology has increased dose of Imdur and beta blocker. Ranexa held secondary to renal insufficiency-consider resuming as renal function  improves.  Right upper  extremity pain and swelling - Reported on 2/23. Right upper extremity distal to elbow does appear diffusely swollen but not warm and is also tender without other features of cellulitis or arthritis. Good radial pulsation felt.? Secondary to IV fluids versus rule out DVT versus gout flare. - Elevated right upper extremity. Requested charge nurse to cut restricting band on right wrist. Right upper extremity venous Doppler negative for DVT. Did receive a dose of prednisone 40 mg on 2/23. Right upper extremity swelling and pain improved. As per nephrology, she may need further evaluation for central vein stenosis-has right upper extremity AV graft. - CT shows right shoulder effusion. Requested orthopedic/Dr. Sharol Given consultation appreciated: discussed with Dr. Sharol Given 2/26 and he was requested IR aspirate right shoulder.   Hyponatremia -Secondary to volume depletion - Sodium normal today.  Paroxysmal atrial fibrillation -Presently in sinus rhythm -Rate controlled -Continue warfarin. INR therapeutic.  Warfarin-induced coagulopathy -Patient has supratherapeutic INR--8.43 on admission. Decreased. -As the patient is not actively bleeding and hemodynamically stable, allow INR to drift down -vitamin K if bleed or INR continues to rise  Hypertension -Continue carvedilol and Imdur. Dose increased by cardiology  SLE/positive anticardiolipin antibodies/history of DVT/PE:  -On chronic Coumadin at home. INR as above.. Status post renal transplant with chronic allograft nephropathy: Continue immunosuppressants  Anemia of CKD -Baseline hemoglobin 9-10 -Transfuse one unit PRBC in setting of drop in Hgb and chest pain. Stable.  Right fibular and medial malleolus fracture -As per cardiology, patient would be high risk for surgery with a recent NSTEMI -Continue CAM boot. -Outpt follow up with Dr. Ninfa Linden   DVT prophylaxis: Anticoagulated on Coumadin. CODE STATUS: Full Family  Communication: None at bedside Disposition Plan: DC home when medically stable.    Procedures/Studies: Ct Head Wo Contrast  03/14/2015  CLINICAL DATA:  Acute onset of dizziness.  Initial encounter. EXAM: CT HEAD WITHOUT CONTRAST TECHNIQUE: Contiguous axial images were obtained from the base of the skull through the vertex without intravenous contrast. COMPARISON:  CT of the head performed 02/14/2015 FINDINGS: There is no evidence of acute infarction, mass lesion, or intra- or extra-axial hemorrhage on CT. Prominence of the ventricles sulci reflects mild cortical volume loss. The brainstem and fourth ventricle are within normal limits. The basal ganglia are unremarkable in appearance. The cerebral hemispheres demonstrate grossly normal gray-white differentiation. No mass effect or midline shift is seen. There is no evidence of fracture; visualized osseous structures are unremarkable in appearance. The visualized portions of the orbits are within normal limits. The paranasal sinuses and mastoid air cells are well-aerated. No significant soft tissue abnormalities are seen. IMPRESSION: 1. No acute intracranial pathology seen on CT. 2. Mild cortical volume loss noted. Electronically Signed   By: Garald Balding M.D.   On: 03/14/2015 00:35   Ct Humerus Right Wo Contrast  03/18/2015  CLINICAL DATA:  [place] 3-4 days ago.  Injured right upper extremity. EXAM: CT OF THE RIGHT SHOULDER WITHOUT CONTRAST; CT OF THE RIGHT HUMERUS WITHOUT CONTRAST; CT OF THE RIGHT FOREARM WITHOUT CONTRAST TECHNIQUE: Multidetector CT imaging was performed according to the standard protocol. Multiplanar CT image reconstructions were also generated. COMPARISON:  None. FINDINGS: No acute fractures are identified. The shoulder joint is maintained. No fracture or dislocation. Mild AC joint degenerative changes. No AC joint separation. No humeral fracture. There is extensive subacromial/subdeltoid and subscapularis bursitis. There is also a  large glenohumeral joint effusion and severe synovitis. Dialysis grafts are noted in the right upper extremity. The elbow joint is maintained.  No elbow fracture or obvious joint effusion. Headache cardiac No forearm fractures are identified. There is a moderate-sized right pleural effusion with overlying atelectasis. No obvious acute right rib fractures are identified. The thoracic vertebral bodies are intact. Moderate degenerative changes noted at the sternoclavicular joints. Small scarred right kidney is noted. There is advanced atherosclerotic calcifications involving the aorta and branch vessels. IMPRESSION: No acute fractures of the right upper extremity are identified. The joints are maintained. Extensive synovitis, joint effusion and bursitis involving the right shoulder. Electronically Signed   By: Marijo Sanes M.D.   On: 03/18/2015 09:11   Ct Shoulder Right Wo Contrast  03/18/2015  CLINICAL DATA:  [place] 3-4 days ago.  Injured right upper extremity. EXAM: CT OF THE RIGHT SHOULDER WITHOUT CONTRAST; CT OF THE RIGHT HUMERUS WITHOUT CONTRAST; CT OF THE RIGHT FOREARM WITHOUT CONTRAST TECHNIQUE: Multidetector CT imaging was performed according to the standard protocol. Multiplanar CT image reconstructions were also generated. COMPARISON:  None. FINDINGS: No acute fractures are identified. The shoulder joint is maintained. No fracture or dislocation. Mild AC joint degenerative changes. No AC joint separation. No humeral fracture. There is extensive subacromial/subdeltoid and subscapularis bursitis. There is also a large glenohumeral joint effusion and severe synovitis. Dialysis grafts are noted in the right upper extremity. The elbow joint is maintained. No elbow fracture or obvious joint effusion. Headache cardiac No forearm fractures are identified. There is a moderate-sized right pleural effusion with overlying atelectasis. No obvious acute right rib fractures are identified. The thoracic vertebral bodies are  intact. Moderate degenerative changes noted at the sternoclavicular joints. Small scarred right kidney is noted. There is advanced atherosclerotic calcifications involving the aorta and branch vessels. IMPRESSION: No acute fractures of the right upper extremity are identified. The joints are maintained. Extensive synovitis, joint effusion and bursitis involving the right shoulder. Electronically Signed   By: Marijo Sanes M.D.   On: 03/18/2015 09:11   Ct Forearm Right Wo Contrast  03/18/2015  CLINICAL DATA:  [place] 3-4 days ago.  Injured right upper extremity. EXAM: CT OF THE RIGHT SHOULDER WITHOUT CONTRAST; CT OF THE RIGHT HUMERUS WITHOUT CONTRAST; CT OF THE RIGHT FOREARM WITHOUT CONTRAST TECHNIQUE: Multidetector CT imaging was performed according to the standard protocol. Multiplanar CT image reconstructions were also generated. COMPARISON:  None. FINDINGS: No acute fractures are identified. The shoulder joint is maintained. No fracture or dislocation. Mild AC joint degenerative changes. No AC joint separation. No humeral fracture. There is extensive subacromial/subdeltoid and subscapularis bursitis. There is also a large glenohumeral joint effusion and severe synovitis. Dialysis grafts are noted in the right upper extremity. The elbow joint is maintained. No elbow fracture or obvious joint effusion. Headache cardiac No forearm fractures are identified. There is a moderate-sized right pleural effusion with overlying atelectasis. No obvious acute right rib fractures are identified. The thoracic vertebral bodies are intact. Moderate degenerative changes noted at the sternoclavicular joints. Small scarred right kidney is noted. There is advanced atherosclerotic calcifications involving the aorta and branch vessels. IMPRESSION: No acute fractures of the right upper extremity are identified. The joints are maintained. Extensive synovitis, joint effusion and bursitis involving the right shoulder. Electronically Signed    By: Marijo Sanes M.D.   On: 03/18/2015 09:11   US Renal  03/15/2015  CLINICAL DATA:  Acute renal failure superimposed on stage 4 chronic kidney disease. EXAM: RENAL / URINARY TRACT ULTRASOUND COMPLETE COMPARISON:  Included portions from chest CT 02/18/2015 FINDINGS: Right Kidney: Not visualized. Left Kidney: Not visualized.  Transplant kidney located in the pelvis, side not specified. There is no transplant hydronephrosis. Blood flow seen. Transplant kidney measures 10 cm. Transplant cortical thickness appears maintained. No focal lesion. Detailed transplant Doppler evaluation not performed. Bladder: Appears normal for degree of bladder distention. IMPRESSION: 1. Native kidneys not identified, likely secondary renal atrophy. 2. Pelvic transplant kidney without hydronephrosis. Electronically Signed   By: Jeb Levering M.D.   On: 03/15/2015 23:08   Dg Chest Portable 1 View  03/14/2015  CLINICAL DATA:  Fever and weakness tonight. EXAM: PORTABLE CHEST 1 VIEW COMPARISON:  Radiograph 02/18/2015.  Chest CT 02/16/2015 FINDINGS: Cardiomediastinal contours are unchanged allowing for patient rotation. Small left pleural effusion, diminished from prior. No evidence of right pleural effusion. Left basilar scarring. No pulmonary edema. No confluent airspace disease. No pneumothorax. IMPRESSION: Persistent but decreased left pleural effusion. Exam is otherwise unchanged. Electronically Signed   By: Jeb Levering M.D.   On: 03/14/2015 02:09   Dg Fluoro Guided Needle Plc Aspiration/injection Loc  03/21/2015  CLINICAL DATA:  Sinusitis, joint effusion and bursitis involving the right shoulder on CT 03/17/2015. EXAM: RIGHT SHOULDER ASPIRATION UNDER FLUOROSCOPY TECHNIQUE: Despite an INR of 3.99, Dr. Meridee Score insisted on a relatively emergent right shoulder aspiration due to the possibility of sepsis. The increased risk of bleeding was discussed with the patient prior to the procedure, as well as the risk of infection.  Patient elected to proceed with the procedure. Informed consent was obtained. An appropriate skin entrance site was determined. The site was marked, prepped with Betadine, draped in the usual sterile fashion, and infiltrated locally with buffered Lidocaine. Based on CT imaging, 80 20 gauge spinal needle was inserted over the mid humeral head and 5-60 cc of serosanguineous fluid returned. No immediate complication. FLUOROSCOPY TIME:  Radiation Exposure Index (as provided by the fluoroscopic device): If the device does not provide the exposure index: Fluoroscopy Time (in minutes and seconds):  0 minutes 24 seconds. Number of Acquired Images:  None. FINDINGS: Despite an INR of 3.99, Dr. Meridee Score insisted on a relatively emergent right shoulder aspiration due to the possibility of sepsis. The increased risk of bleeding was discussed with the patient prior to the procedure, as well as the risk of infection. Patient elected to proceed with the procedure. Informed consent was obtained. An appropriate skin entrance site was determined. The site was marked, prepped with Betadine, draped in the usual sterile fashion, and infiltrated locally with buffered Lidocaine. Based on CT imaging, a 20 gauge spinal needle was inserted over the mid humeral head with return of 5-6 cc of serosanguineous fluid. No immediate complication. IMPRESSION: Technically successful right shoulder aspiration. Electronically Signed   By: Lorin Picket M.D.   On: 03/21/2015 15:16        Objective: Filed Vitals:   03/23/15 2135 03/24/15 0420 03/24/15 0500 03/24/15 0920  BP: 129/53 130/47 134/64 143/57  Pulse: 74 77 88 79  Temp: 98.6 F (37 C)  98.8 F (37.1 C) 98.9 F (37.2 C)  TempSrc: Oral  Oral Oral  Resp: 18  18 18   Height:      Weight:   67 kg (147 lb 11.3 oz)   SpO2: 96%  97% 98%    Intake/Output Summary (Last 24 hours) at 03/24/15 1329 Last data filed at 03/24/15 0900  Gross per 24 hour  Intake    700 ml  Output       0 ml  Net    700 ml  Weight change: 0.9 kg (1 lb 15.8 oz) Exam:   General:  Pt is alert, follows commands appropriately, not in acute distress.   HEENT: No icterus, No thrush, No neck mass, [place]/AT  Cardiovascular: RRR, S1/S2, no rubs, no gallops. Telemetry: Sinus rhythm.  Respiratory: CTA bilaterally, no wheezing, no crackles, no rhonchi  Abdomen: Soft/+BS, non tender, non distended, no guarding  Extremities: 1+RLE edema, No lymphangitis, No petechiae, No rashes, no synovitis. Right leg in cam boot. Right upper extremity swelling improved some. No other acute findings.  Data Reviewed: Basic Metabolic Panel:  Recent Labs Lab 03/18/15 0530 03/19/15 0553 03/20/15 1115 03/21/15 0527 03/22/15 1416  NA 134* 138 139 137 139  K 4.7 4.8 4.8 4.8 5.0  CL 103 106 105 106 103  CO2 21* 22 23 21* 23  GLUCOSE 182* 151* 118* 135* 146*  BUN 58* 59* 54* 53* 46*  CREATININE 2.69* 2.34* 2.28* 2.25* 2.34*  CALCIUM 9.6 9.4 9.8 9.1 9.8  PHOS 3.4 2.5 2.3* 2.5 2.4*   Liver Function Tests:  Recent Labs Lab 03/18/15 0530 03/19/15 0553 03/20/15 1115 03/21/15 0527 03/22/15 1416  ALBUMIN 2.2* 1.9* 2.0* 2.0* 2.1*   No results for input(s): LIPASE, AMYLASE in the last 168 hours. No results for input(s): AMMONIA in the last 168 hours. CBC:  Recent Labs Lab 03/21/15 0527 03/22/15 1416 03/22/15 1641 03/23/15 0621 03/24/15 0554  WBC 7.9 7.6 7.8 5.7 7.2  NEUTROABS  --   --  5.9  --   --   HGB 8.8* 9.5* 8.8* 9.9* 9.2*  HCT 27.3* 28.9* 28.0* 32.1* 27.6*  MCV 84.0 85.3 84.6 84.3 85.4  PLT 125* 147* 136* 69* 150   Cardiac Enzymes:  Recent Labs Lab 03/17/15 1819  TROPONINI 0.08*   BNP: Invalid input(s): POCBNP CBG: No results for input(s): GLUCAP in the last 168 hours.  Recent Results (from the past 240 hour(s))  Culture, blood (Routine X 2) w Reflex to ID Panel     Status: None   Collection Time: 03/16/15  5:00 AM  Result Value Ref Range Status   Specimen Description BLOOD  LEFT HAND  Final   Special Requests AEROBIC BOTTLE ONLY 5ML  Final   Culture  Setup Time   Final    GRAM POSITIVE COCCI IN PAIRS IN CHAINS AEROBIC BOTTLE ONLY CRITICAL RESULT CALLED TO, READ BACK BY AND VERIFIED WITH: A. LOWE,RN AT [id number] ON [id number] BY Rhea Bleacher    Culture   Final    ENTEROCOCCUS SPECIES SUSCEPTIBILITIES PERFORMED ON PREVIOUS CULTURE WITHIN THE LAST 5 DAYS.    Report Status 03/19/2015 FINAL  Final  Culture, blood (Routine X 2) w Reflex to ID Panel     Status: None   Collection Time: 03/16/15  5:04 AM  Result Value Ref Range Status   Specimen Description BLOOD LEFT FINGER  Final   Special Requests IN PEDIATRIC BOTTLE  Final   Culture  Setup Time   Final    GRAM POSITIVE COCCI IN CLUSTERS IN PAIRS AEROBIC BOTTLE ONLY CRITICAL RESULT CALLED TO, READ BACK BY AND VERIFIED WITHBarnabas Harries RN [id number] 03/18/15 A BROWNING    Culture   Final    GRANULICATELLA ELEGANS THIS IS A NUTRTIONALLY VARIANT STREPTOCOCCUS SENSITIVITIES ARE NOT PERFORMED NOTIFIED DR. Hartford Poli AT [id number] ON [id number] BY Rhea Bleacher    Report Status 03/23/2015 FINAL  Final  Culture, blood (Routine X 2) w Reflex to ID Panel     Status: None   Collection Time: 03/18/15  8:54 AM  Result Value Ref Range Status   Specimen Description BLOOD BLOOD LEFT HAND  Final   Special Requests IN PEDIATRIC BOTTLE 2.5CC  Final   Culture NO GROWTH 5 DAYS  Final   Report Status 03/23/2015 FINAL  Final  Culture, blood (Routine X 2) w Reflex to ID Panel     Status: None   Collection Time: 03/18/15  9:01 AM  Result Value Ref Range Status   Specimen Description BLOOD BLOOD LEFT HAND  Final   Special Requests IN PEDIATRIC BOTTLE 1CC  Final   Culture NO GROWTH 5 DAYS  Final   Report Status 03/23/2015 FINAL  Final  Body fluid culture     Status: None (Preliminary result)   Collection Time: 03/21/15  2:00 PM  Result Value Ref Range Status   Specimen Description FLUID SYNOVIAL RIGHT SHOULDER  Final   Special Requests NONE  Final    Gram Stain   Final    ABUNDANT WBC PRESENT, PREDOMINANTLY PMN NO ORGANISMS SEEN    Culture   Final    FEW ENTEROCOCCUS SPECIES CRITICAL RESULT CALLED TO, READ BACK BY AND VERIFIED WITH: R HILL,RN AT 1324 03/24/15 BY L BENFIELD    Report Status PENDING  Incomplete     Scheduled Meds: . allopurinol  200 mg Oral Daily  . amLODipine  5 mg Oral Daily  . ampicillin (OMNIPEN) IV  2 g Intravenous 3 times per day  . atorvastatin  40 mg Oral q1800  . calcitRIOL  0.25 mcg Oral QODAY  . calcitRIOL  0.5 mcg Oral QODAY  . carvedilol  25 mg Oral BID WC  . darbepoetin (ARANESP) injection - NON-DIALYSIS  100 mcg Subcutaneous Q Sat-1800  . famotidine  20 mg Oral Daily  . feeding supplement (NEPRO CARB STEADY)  237 mL Oral BID BM  . feeding supplement (PRO-STAT SUGAR FREE 64)  30 mL Oral Daily  . furosemide  40 mg Oral Daily  . isosorbide mononitrate  90 mg Oral Daily  . latanoprost  1 drop Both Eyes QHS  . magnesium oxide  400 mg Oral BID  . mycophenolate  180 mg Oral BID  . predniSONE  5 mg Oral Q breakfast  . ranolazine  500 mg Oral BID  . sodium bicarbonate  1,300 mg Oral BID  . tacrolimus  2 mg Oral BID  . Warfarin - Pharmacist Dosing Inpatient   Does not apply q1800   Continuous Infusions:   [facility] Pager [phone]  If 7PM-7AM, please contact night-coverage [email or web address] Password [email or web address] 03/24/2015, 1:29 PM   LOS: 10 days

## 2015-03-24 NOTE — Progress Notes (Signed)
PT Cancellation Note  Patient Details Name: Debbie [name] MRN: [id number] DOB: 10-04-1950   Cancelled Treatment:    Reason Eval/Treat Not Completed: Patient declined, no reason specified Declines to work with therapy at this time. States she has been having loose frequent bowel movements and has just found out some "bad news." Will attempt to see again as time allows, likely tomorrow.  Ellouise Newer 03/24/2015, 11:09 AM  Elayne Snare, [place]

## 2015-03-24 NOTE — Progress Notes (Signed)
[place] for Warfarin  Indication: atrial fibrillation  Patient Measurements: Height: 5\' 1"  (154.9 cm) Weight: 147 lb 11.3 oz (67 kg) IBW/kg (Calculated) : 47.8  Vital Signs: Temp: 98.8 F (37.1 C) (03/02 0500) Temp Source: Oral (03/02 0500) BP: 134/64 mmHg (03/02 0500) Pulse Rate: 88 (03/02 0500)  Labs:  Recent Labs  03/22/15 1416 03/22/15 1641 03/23/15 0621 03/24/15 0554  HGB 9.5* 8.8* 9.9* 9.2*  HCT 28.9* 28.0* 32.1* 27.6*  PLT 147* 136* 69* 150  LABPROT 37.8*  --  35.9* 34.3*  INR 3.97*  --  3.70* 3.49*  CREATININE 2.34*  --   --   --     Estimated Creatinine Clearance: 21 mL/min (by C-G formula based on Cr of 2.34).   Assessment: 70 YOF s/p kidney transplant here with dizziness. She was recenetly discharged after episode of AKI and NSTEMI which was treated conservatively. She is on warfarin PTA for hypercoagulable state and hx DVT. INR was ~8 on 2/17 in the anti-coag clinic and instructions were to hold doses, admit INR was also high at 8.43.  Vitamin K 2.5mg  po x1 given 2/20, she was also transfused on 2/21. INR was at goal and she received doses on 2/23, 2/24 and 2/25. INR is now elevated, but continues to decrease as she has not received any doses since 2/25. Today's INR 3.49. Elevation in INR most likely d/t antibiotics.  Hgb 9.2, Plts 150- no bleeding noted.  Goal of Therapy:  INR 2-3 Monitor platelets by anticoagulation protocol: Yes   Plan:  -Hold warfarin tonight, anticipate being able to restart tomorrow (3/3) -Daily PT/INR -Monitor for bleeding  Shareen Capwell D. Osmani Kersten, PharmD, BCPS Clinical Pharmacist Pager: [phone] 03/24/2015 8:46 AM

## 2015-03-24 NOTE — Progress Notes (Signed)
3CRITICAL VALUE ALERT  Critical value received:  Positive aspiration from right shoulder growing enterococci   Date of notification:  03/24/2015  Time of notification:  1:27 PM  Critical value read back:Yes.    Nurse who received alert:  Maggie Font RN   MD notified (1st page):  Hartford Poli, MD  Time of first page:  1:27 PM  MD notified (2nd page):  Time of second page:  Responding MD:  Hartford Poli, MD  Time MD responded:  1:30 PM   Comments: No new orders written at this time. Will continue to assess and monitor the patient   [employer], RN-BC, [facility] [place] Phone [phone]

## 2015-03-25 ENCOUNTER — Encounter: Payer: Medicare Other | Admitting: Physician Assistant

## 2015-03-25 ENCOUNTER — Inpatient Hospital Stay (HOSPITAL_COMMUNITY): Payer: Medicare Other

## 2015-03-25 ENCOUNTER — Encounter (HOSPITAL_COMMUNITY)
Admission: EM | Disposition: A | Discharge status: Skilled Nursing Facility | Payer: Self-pay | Source: Home / Self Care | Attending: Internal Medicine

## 2015-03-25 DIAGNOSIS — I10 Essential (primary) hypertension: Secondary | ICD-10-CM

## 2015-03-25 DIAGNOSIS — B952 Enterococcus as the cause of diseases classified elsewhere: Secondary | ICD-10-CM

## 2015-03-25 DIAGNOSIS — I480 Paroxysmal atrial fibrillation: Secondary | ICD-10-CM

## 2015-03-25 LAB — BASIC METABOLIC PANEL
ANION GAP: 12 (ref 5–15)
BUN: 44 mg/dL — ABNORMAL HIGH (ref 6–20)
CHLORIDE: 101 mmol/L (ref 101–111)
CO2: 25 mmol/L (ref 22–32)
CREATININE: 2.73 mg/dL — AB (ref 0.44–1.00)
Calcium: 10 mg/dL (ref 8.9–10.3)
GFR calc non Af Amer: 17 mL/min — ABNORMAL LOW (ref >60)
GFR, EST AFRICAN AMERICAN: 20 mL/min — AB (ref >60)
Glucose, Bld: 138 mg/dL — ABNORMAL HIGH (ref 65–99)
POTASSIUM: 4.6 mmol/L (ref 3.5–5.1)
Sodium: 138 mmol/L (ref 135–145)

## 2015-03-25 LAB — CBC
HEMATOCRIT: 23.8 % — AB (ref 36.0–46.0)
HEMOGLOBIN: 7.9 g/dL — AB (ref 12.0–15.0)
MCH: 28.3 pg (ref 26.0–34.0)
MCHC: 33.2 g/dL (ref 30.0–36.0)
MCV: 85.3 fL (ref 78.0–100.0)
Platelets: 141 10*3/uL — ABNORMAL LOW (ref 150–400)
RBC: 2.79 MIL/uL — AB (ref 3.87–5.11)
RDW: 17.6 % — ABNORMAL HIGH (ref 11.5–15.5)
WBC: 6.8 10*3/uL (ref 4.0–10.5)

## 2015-03-25 LAB — C DIFFICILE QUICK SCREEN W PCR REFLEX
C DIFFICLE (CDIFF) ANTIGEN: NEGATIVE
C Diff interpretation: NEGATIVE
C Diff toxin: NEGATIVE

## 2015-03-25 LAB — PROTIME-INR
INR: 1.54 — AB (ref 0.00–1.49)
Prothrombin Time: 18.5 seconds — ABNORMAL HIGH (ref 11.6–15.2)

## 2015-03-25 SURGERY — ARTHROSCOPY, SHOULDER
Anesthesia: General | Laterality: Right

## 2015-03-25 MED ORDER — FUROSEMIDE 10 MG/ML IJ SOLN
INTRAMUSCULAR | Status: AC
  Filled 2015-03-25: qty 4

## 2015-03-25 MED ORDER — SODIUM CHLORIDE 0.9% FLUSH
10.0000 mL | INTRAVENOUS | Status: DC | PRN
  Administered 2015-03-25 (×3): 10 mL

## 2015-03-25 MED ORDER — WARFARIN SODIUM 2.5 MG PO TABS
1.2500 mg | ORAL_TABLET | Freq: Once | ORAL | Status: AC
  Administered 2015-03-25: 1.25 mg via ORAL
  Filled 2015-03-25: qty 0.5

## 2015-03-25 MED ORDER — FUROSEMIDE 10 MG/ML IJ SOLN
40.0000 mg | Freq: Once | INTRAMUSCULAR | Status: AC
  Administered 2015-03-25: 40 mg via INTRAVENOUS

## 2015-03-25 MED ORDER — LORAZEPAM 1 MG PO TABS
1.0000 mg | ORAL_TABLET | Freq: Once | ORAL | Status: AC
  Administered 2015-03-25: 1 mg via ORAL
  Filled 2015-03-25: qty 1

## 2015-03-25 NOTE — Progress Notes (Addendum)
Debbie [name] for Warfarin  Indication: atrial fibrillation  Patient Measurements: Height: 5\' 1"  (154.9 cm) Weight: 147 lb 11.3 oz (67 kg) IBW/kg (Calculated) : 47.8  Vital Signs: Temp: 98.3 F (36.8 C) (03/03 0434) Temp Source: Oral (03/03 0434) BP: 133/55 mmHg (03/03 0917) Pulse Rate: 78 (03/03 0917)  Labs:  Recent Labs  03/22/15 1416  03/23/15 0621 03/24/15 0554 03/25/15 0614  HGB 9.5*  < > 9.9* 9.2* 7.9*  HCT 28.9*  < > 32.1* 27.6* 23.8*  PLT 147*  < > 69* 150 141*  LABPROT 37.8*  --  35.9* 34.3* 18.5*  INR 3.97*  --  3.70* 3.49* 1.54*  CREATININE 2.34*  --   --   --   --   < > = values in this interval not displayed.  Estimated Creatinine Clearance: 21 mL/min (by C-G formula based on Cr of 2.34).   Assessment: 66 YOF s/p kidney transplant here with dizziness. She was recenetly discharged after episode of AKI and NSTEMI which was treated conservatively. She is on warfarin PTA for hypercoagulable state and hx DVT. INR was hight PTA in the clinic as well as on admission. Vitamin K 2.5mg  po x1 given 2/20, she was also transfused on 2/21.  INR then was at goal and she received doses on 2/23, 2/24 and 2/25, but then INR increased again and no doses have been given since. She received vitamin K 5mg  IV x1 last evening along with FFP in preparation for I&D of shoulder this morning. However, patient refused this procedure and per Dr. Jess [name] note, surgery placed on hold.  INR currently 1.54, Hgb7.9, Plts 141- stable, no bleeding noted.  Goal of Therapy:  INR 2-3 Monitor platelets by anticoagulation protocol: Yes   Plan:  -NO warfarin dose entered right now as unclear on timing of surgery. Warfarin consult per pharmacy is active and we are following along -Daily PT/INR and CBC -Monitor for bleeding  Debbie [name], PharmD, BCPS Clinical Pharmacist Pager: [phone] 03/25/2015 10:23 AM    ADDENDUM  Spoke with Dr. Hartford [name] this  afternoon. Patient not going to go to surgery, is to get central line placement today. He is ok with restarting warfarin tonight.  Note: it will likely take a little bit of time for INR to come back up since patient received vitamin K, however with her INR being so high on admit and her being so sensitive to warfarin during admission, do not want to give her large doses.  Plan: -warfarin 1.25mg  po x1 tonight -daily INR and CBC -follow s/s bleeding  Debbie [name], PharmD, BCPS Clinical Pharmacist Pager: [phone] 03/25/2015 1:37 PM

## 2015-03-25 NOTE — Interval H&P Note (Signed)
History and Physical Interval Note:  03/25/2015 6:51 AM  Debbie [name]  has presented today for surgery, with the diagnosis of Abscess Right Shoulder  The various methods of treatment have been discussed with the patient and family. After consideration of risks, benefits and other options for treatment, the patient has consented to  Procedure(s): Irrigation and Debridement, Arthroscopy Right Shoulder (Right) as a surgical intervention .  The patient's history has been reviewed, patient examined, no change in status, stable for surgery.  I have reviewed the patient's chart and labs.  Questions were answered to the patient's satisfaction.     [name]

## 2015-03-25 NOTE — Progress Notes (Signed)
[facility]  Met with Ms. Covin daughter and sister regarding POC for home IV ABX.  Family states they were not aware they would need to learn to administer the IV ABX.  Also shared copay for IV Ampicillin for home.  Pt has MCR part D plan. Pt has high copay and unable to afford at home.  Please work with MD/ID team for alternative if pt does DC to home.  Per Golda Acre, RN, PT has recommended SNF.  [facility] hospital team will follow pt to support as ordered/needed.  If patient discharges after hours, please call [phone].   Larry Sierras 03/25/2015, 7:03 PM

## 2015-03-25 NOTE — Care Management Note (Signed)
Case Management Note  Patient Details  Name: Debbie [name] MRN: [id number] Date of Birth: [birth year]  Subjective/Objective:         CM following for progression and d/c planning.           Action/Plan: 03/25/2015 Met with pt and sister on 03/24/2015, pt plan is to return to home with [facility] services. Pt very worried that she will be sent to a rehab center, pt somewhat confused, stating that a doctor came in her room and said that she would be going to [facility]. No one is aware of this occurrence and the rehab is unknown to this CM.  This was explained to this pt sister by pt RN.   Plan today is to d/c to home with [facility] to adm IV antibiotics. Pt daughter gave permission for a central line to be placed as pt is potential HD pt and renal doctors requesting that PICC not be placed. [facility] is currently providing services to this pt and per [facility] RN rep, Butch Penny , they will be able to adm the antibiotic via the central line. Pt very anxious today and complaining of SOB, given Ativan for anxiety and now lethargic, pt RN updated pt daughter.   Expected Discharge Date:                  Expected Discharge Plan:  [place]  In-House Referral:  NA  Discharge planning Services  CM Consult  Post Acute Care Choice:  Home Health, Durable Medical Equipment Choice offered to:  Patient  DME Arranged:  IV pump/equipment DME Agency:  [place]:  RN, PT [facility] Agency:  [place]  Status of Service:  In process, will continue to follow  Medicare Important Message Given:  Yes Date Medicare IM Given:    Medicare IM give by:    Date Additional Medicare IM Given:    Additional Medicare Important Message give by:     If discussed at [place] of Stay Meetings, dates discussed:    Additional Comments:  Adron Bene, RN 03/25/2015, 2:02 PM

## 2015-03-25 NOTE — Progress Notes (Signed)
Discussed with the patient that her cultures from the right shoulder aspirate are positive for enterococcus. Patient states that she does not want to proceed with irrigation and debridement of the right shoulder. She passively moves her shoulder and states that her shoulder feels fine. I discussed the importance of treating a potential infection. Patient states she understands but does not want to consider surgery at this time. I will place her surgery on hold.

## 2015-03-25 NOTE — Procedures (Signed)
Central Venous Catheter Insertion Procedure Note Debbie [name] [id number] [birth year]  Procedure: Insertion of Central Venous Catheter Indications: Assessment of intravascular volume, Drug and/or fluid administration and Frequent blood sampling  Procedure Details Consent: Risks of procedure as well as the alternatives and risks of each were explained to the (patient/caregiver).  Consent for procedure obtained.   Time Out: Verified patient identification, verified procedure, site/side was marked, verified correct patient position, special equipment/implants available, medications/allergies/relevent history reviewed, required imaging and test results available.  Performed  Maximum sterile technique was used including antiseptics, cap, gloves, gown, hand hygiene, mask and sheet. Skin prep: Chlorhexidine; local anesthetic administered A antimicrobial bonded/coated triple lumen catheter was placed in the left internal jugular vein to 17 cm using the Seldinger technique.  Evaluation Blood flow good Complications: No apparent complications Patient did tolerate procedure well. Chest X-ray ordered to verify placement.  CXR: pending.    Procedure performed under direct supervision of Dr. Ashok Cordia and with ultrasound guidance for real time vessel cannulation.      Debbie Gens, NP-C [facility] Pgr: [phone] or if no answer [phone] 03/25/2015, 3:46 PM

## 2015-03-25 NOTE — Progress Notes (Addendum)
S:  Called by RN regarding CXR reading of central line placement.    O:    CXR noted above with central line directed toward right brachiocephalic vein.  Patient has current RUE cellulitis with septic shoulder (entercoccus), RUE negative for DVT but significantly swollen.  Avoided R IJ due to swelling.  She has a RUE AVG and questionable central vein stenosis.  Central line pulled back to 16 cm (pt is 5'1).     Plan: Repeat CXR now and in am.  Hope that with forward flow the line will flip into the SVC.  Updated Dr. Hartford Poli on CXR findings Repeat CXR in am to review placement.   Line will be ok for antibiotics and blood draws.     [name], NP-C [facility] Pgr: [phone] or if no answer [phone] 03/25/2015, 4:46 PM

## 2015-03-25 NOTE — Progress Notes (Signed)
Patient ID: Debbie [name], female   DOB: 1950/06/13, 65 y.o.   MRN: [id number]         [place] for Infectious Disease    Date of Admission:  03/13/2015   Day 12 antibiotics  [name] has enterococcal bacteremia complicated by septic right shoulder. Fortunately she was agreeable to have any central line placed today. She has refused surgical incision and drainage of her shoulder. She will require a longer course of antibiotic therapy. I would recommend 1 more month of ampicillin. Please call Dr. Talbot Grumbling [phone]) for any infectious disease questions this weekend.         Michel Bickers, MD [facility] for Infectious [place] [facility] [phone] pager   [phone] cell 01/25/2015, 1:32 PM

## 2015-03-25 NOTE — Progress Notes (Signed)
At 8am patient rang call bell to state she couldn't breathe and was having chest pain. Patient currently on 2-3L O2 n/c and O2 sats at 100%. One dose of nitro administered at 0800 for chest pain 8/10. At 0806 pt states her pain was 5/10. Patient states she feels very fluid overloaded and would like IV lasix. Dr. Hartford Poli notified and orders received for IV lasix and one time dose of ativan for anxiety. Vitals otherwise stable. Remained in patient's room until she had calmed down and dyspnea had resolved. Will continue to monitor.  Joellen Jersey, RN.

## 2015-03-25 NOTE — Progress Notes (Signed)
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     PT Comments    Patient confused today, difficulty with transfer requiring max assist for squat pivot to [facility] and recliner. Educated on exercises and need for getting OOB frequently to prevent atrophy and further decline in function. Recommended nursing staff use lift for transfer back to bed if pt remains confused. Patient will continue to benefit from skilled physical therapy services to further improve independence with functional mobility.   Follow Up Recommendations  SNF;Supervision/Assistance - 24 hour     Equipment Recommendations  None recommended by PT    Recommendations for Other Services       Precautions / Restrictions Precautions Precautions: Fall Required Braces or Orthoses:  (CAM boot on R) Restrictions Weight Bearing Restrictions: Yes RLE Weight Bearing: Non weight bearing    Mobility  Bed Mobility Overal bed mobility: Needs Assistance Bed Mobility: Supine to Sit     Supine to sit: Mod assist     General bed mobility comments: Mod assist for patient to bring LEs to EOB and use of bed pad to scoot hips to edge. Cues to use rail as able, and assist needed to bring trunk into upright position.  Transfers Overall transfer level: Needs assistance Equipment used: Rolling walker (2 wheeled);None Transfers: Sit to/from [employer] Sit to Stand: Total assist   Squat pivot transfers: Max  assist;From elevated surface     General transfer comment: Attempted to stand with RW, pt unable to perform, assist needed to maintain NWB through RLE. Practiced squat pivot transfer to [facility] and again to recliner with max assist. Poor activation of LLE to assist with transfer, Cues for hand placement and anterior weight shift. Knee block required for Lt knee and assist to maintain NWB on RLE at all times. pt anxious.  Ambulation/Gait                 Stairs            Wheelchair Mobility    Modified Rankin (Stroke Patients Only)       Balance                                    Cognition Arousal/Alertness: Awake/alert Behavior During Therapy: WFL for tasks assessed/performed Overall Cognitive Status: Impaired/Different from baseline Area of Impairment: Orientation;Following commands;Safety/judgement;Problem solving Orientation Level: Disoriented to;Place;Time;Situation   Memory: Decreased recall of precautions;Decreased short-term memory Following Commands: Follows one step commands inconsistently Safety/Judgement: Decreased awareness of safety;Decreased awareness of deficits   Problem Solving: Slow processing;Difficulty sequencing;Requires verbal cues;Requires tactile cues;Decreased initiation      Exercises General Exercises - Lower Extremity Ankle Circles/Pumps: AROM;Left;10 reps;Supine Straight Leg Raises: Strengthening;Both;5 reps;Supine    General Comments        Pertinent Vitals/Pain Pain Assessment: Faces Faces Pain Scale: Hurts even more Pain Location: RLE and shoulder Pain Descriptors / Indicators: Aching;Sore Pain Intervention(s): Monitored during session;Repositioned    Home Living  Prior Function            PT Goals (current goals can now be found in the care plan section) Acute Rehab PT Goals Patient Stated Goal: do somethign to get strong PT Goal Formulation: With patient Time For Goal  Achievement: 03/30/15 Potential to Achieve Goals: Fair Progress towards PT goals: Progressing toward goals    Frequency  Min 2X/week    PT Plan Current plan remains appropriate    Co-evaluation             End of Session Equipment Utilized During Treatment: Gait belt;Other (comment) (R boot) Activity Tolerance: Patient limited by fatigue (confusion) Patient left: with call bell/phone within reach;in chair;with chair alarm set     Time: [id number] PT Time Calculation (min) (ACUTE ONLY): 33 min  Charges:  $Therapeutic Activity: 23-37 mins                    G Codes:      Ellouise Newer 2015-04-24, 1:00 PM  Camille Bal Banc[place], [place]

## 2015-03-25 NOTE — H&P (View-Only) (Signed)
ORTHOPAEDIC CONSULTATION  REQUESTING PHYSICIAN: Modena Jansky, MD  Chief Complaint: Swelling and pain right upper extremity.  HPI: Debbie [name] is a 65 y.o. female who presents with pain and swelling right upper extremity. Patient is status post a rejection of a renal transplant. She has end-stage renal disease bacteremia and severe protein caloric malnutrition.  Past Medical History  Diagnosis Date  . Kidney transplant as cause of abnormal reaction or later complication   . Hypertension   . Colon polyps   . Gall stones 1980  . Anti-phospholipid antibody syndrome [facility])     (Based on hospital discharge summary march 2013)  . Paroxysmal atrial fibrillation (HCC)   . Carpal tunnel syndrome 2003    lt  . Esophageal stricture   . Hemorrhoids   . Renal disorder   . ESRD (end stage renal disease) on dialysis [facility])     "til I got my transplant in 2010"  . DVT (deep venous thrombosis) (HCC) 1990    LLE  . History of blood transfusion     "related to the lupus"  . Dyslipidemia 07/22/2013  . Coronary artery disease   . Heart murmur     mild MR on echo  . Pneumonia     "3 times now" (08/25/2013)  . GERD (gastroesophageal reflux disease)   . Arthritis     "fingers" (08/25/2013)  . Glaucoma, left eye   . Bilateral carotid artery stenosis     1-39%  . Pulmonary HTN (HCC)     PASP 62mmHg   Past Surgical History  Procedure Laterality Date  . Nephrectomy transplanted organ  05/2008  . Cholecystectomy  1980  . Hematoma evacuation  1998    "after they took peritoneal catheter out"  . Peritoneal catheter insertion    . Peritoneal catheter removal    . Carpal tunnel release Right 07/2001  . Arteriovenous graft placement    . Thrombectomy and revision of arterioventous (av) goretex  graft Right 07/1999; 04/2002; 09/2002; 12/2005; 09/2007;     upper arm/notes 07/31/1999; 05/18/2002; 10/07/2002; 01/14/2006; 09/14/2007;   . Right heart catheterization N/A 08/28/2013    Procedure: RIGHT HEART  CATH;  Surgeon: Sinclair Grooms, MD;  [facility] CATH LAB;  Service: Cardiovascular;  Laterality: N/A;   Social History   Social History  . Marital Status: Widowed    Spouse Name: N/A  . Number of Children: 3  . Years of Education: N/A   Occupational History  . disabled    Social History Main Topics  . Smoking status: Former Smoker -- 0.00 packs/day for 30 years    Types: Cigarettes    Quit date: 04/16/1977  . Smokeless tobacco: Never Used  . Alcohol Use: Yes  . Drug Use: No  . Sexual Activity: Not Currently   Other Topics Concern  . None   Social History Narrative   Family History  Problem Relation Age of Onset  . Hypertension Mother   . Heart disease Mother   . Colon cancer Neg Hx   . Rectal cancer Neg Hx   . Stomach cancer Neg Hx    - negative except otherwise stated in the family history section Allergies  Allergen Reactions  . Feraheme [Ferumoxytol] Other (See Comments)    Chest pain, pulsating 02/17/15: tolerated Nulecit  . Vancomycin Anaphylaxis, Itching, Swelling and Other (See Comments)    Tongue swell  . Dorzolamide Hcl-Timolol Mal Rash and Other (See Comments)    Eye pain  .  Gentamycin [Gentamicin Sulfate] Itching and Swelling  . Latanoprost Rash and Other (See Comments)    Eye pain  . Cefazolin Itching  . Codeine Itching  . Hydrocodone-Acetaminophen Itching  . Penicillins Itching    Has patient had a PCN reaction causing immediate rash, facial/tongue/throat swelling, SOB or lightheadedness with hypotension:No Has patient had a PCN reaction causing severe rash involving mucus membranes or skin necrosis:No Has patient had a PCN reaction that required hospitalization:No Has patient had a PCN reaction occurring within the last 10 years:No If all of the above answers are "NO", then may proceed with Cephalosporin use.    Prior to Admission medications   Medication Sig Start Date End Date Taking? Authorizing Provider  acetaminophen (TYLENOL) 325 MG  tablet Take 2 tablets (650 mg total) by mouth every 6 (six) hours as needed for mild pain (or Fever >/= 101). 02/21/15  Yes Modena Jansky, MD  allopurinol (ZYLOPRIM) 100 MG tablet Take 2 tablets (200 mg total) by mouth daily. 02/21/15  Yes Modena Jansky, MD  atorvastatin (LIPITOR) 40 MG tablet Take 1 tablet (40 mg total) by mouth daily at 6 PM. 02/21/15  Yes Modena Jansky, MD  bimatoprost (LUMIGAN) 0.01 % SOLN Place 1 drop into both eyes at bedtime.  05/21/13  Yes Historical Provider, MD  Brinzolamide-Brimonidine (SIMBRINZA) 1-0.2 % SUSP Place 1 drop into the left eye 3 (three) times daily. 09/14/14  Yes Historical Provider, MD  calcitRIOL (ROCALTROL) 0.25 MCG capsule Take 0.25-0.5 mcg by mouth every other day. alternating dosages   Yes Historical Provider, MD  carvedilol (COREG) 6.25 MG tablet Take 1 tablet (6.25 mg total) by mouth 2 (two) times daily with a meal. 02/21/15  Yes Modena Jansky, MD  furosemide (LASIX) 40 MG tablet Take 1 tablet (40 mg total) by mouth daily. 02/21/15  Yes Modena Jansky, MD  isosorbide mononitrate (IMDUR) 60 MG 24 hr tablet Take 1 tablet (60 mg total) by mouth daily. 02/21/15  Yes Modena Jansky, MD  MAGNESIUM-OXIDE 400 (241.3 MG) MG tablet TAKE 1 TABLET BY MOUTH TWICE DAILY 10/26/14  Yes Sueanne Margarita, MD  mycophenolate (MYFORTIC) 180 MG EC tablet Take 180 mg by mouth 2 (two) times daily.    Yes Historical Provider, MD  nitroGLYCERIN (NITROSTAT) 0.4 MG SL tablet Place 0.4 mg under the tongue every 5 (five) minutes as needed for chest pain.  08/26/13  Yes Historical Provider, MD  oxyCODONE-acetaminophen (PERCOCET/ROXICET) 5-325 MG tablet Take 1 tablet by mouth every 6 (six) hours as needed for moderate pain or severe pain. Patient taking differently: Take 0.5-1 tablets by mouth every 6 (six) hours as needed for moderate pain or severe pain.  02/21/15  Yes Modena Jansky, MD  predniSONE (DELTASONE) 5 MG tablet Take 5 mg by mouth daily with breakfast.  04/24/12  Yes  Historical Provider, MD  RANEXA 500 MG 12 hr tablet TAKE 1 TABLET BY MOUTH TWICE DAILY 01/11/15  Yes Sueanne Margarita, MD  ranitidine (ZANTAC) 150 MG tablet Take 150 mg by mouth daily.    Yes Historical Provider, MD  sodium bicarbonate 650 MG tablet Take 1,300 mg by mouth 2 (two) times daily.  07/13/11  Yes Historical Provider, MD  tacrolimus (PROGRAF) 1 MG capsule Take 2 mg by mouth 2 (two) times daily.    Yes Historical Provider, MD  warfarin (COUMADIN) 2.5 MG tablet Take 0.5-1 tablets (1.25-2.5 mg total) by mouth daily at 6 PM. Monday and Friday 2.5 mg and 1.25  mg on all other days 02/21/15  Yes Modena Jansky, MD  isosorbide dinitrate (ISORDIL) 10 MG tablet TAKE 1 TABLET THREE TIMES DAILY 03/14/15   Sueanne Margarita, MD  labetalol (NORMODYNE) 100 MG tablet Take 100 mg by mouth 2 (two) times daily. Reported on 03/13/2015 12/15/14   Historical Provider, MD  methocarbamol (ROBAXIN) 500 MG tablet Take 1 tablet (500 mg total) by mouth 2 (two) times daily. Patient not taking: Reported on 03/03/2015 01/23/15   Marella Chimes, PA-C  timolol (TIMOPTIC) 0.5 % ophthalmic solution Place 1 drop into both eyes 2 (two) times daily. Reported on 03/13/2015 05/21/13   Historical Provider, MD   No results found. - pertinent xrays, CT, MRI studies were reviewed and independently interpreted  Positive ROS: All other systems have been reviewed and were otherwise negative with the exception of those mentioned in the HPI and as above.  Physical Exam: General: Alert, no acute distress Cardiovascular: Edema right upper extremity worse than left upper extremity Respiratory: No cyanosis, no use of accessory musculature GI: No organomegaly, abdomen is soft and non-tender Skin: No lesions in the area of chief complaint Neurologic: Sensation intact distally Psychiatric: Patient is competent for consent with normal mood and affect Lymphatic: Patient has significant lymphedema right upper extremity  MUSCULOSKELETAL:  On  examination patient has swelling of the entire right upper extremity swelling of the hand swelling of the arm forearm she does complain of shoulder pain. With passive range of motion of the shoulder she is asymptomatic. Patient has a history of gout and states that she normally takes allopurinol.  Assessment: Assessment: End-stage renal disease with bacteremia and severe protein caloric malnutrition with swelling of the entire right upper extremity with a history of gout.  Plan: Plan: Uric acid level was ordered. With pain-free range of motion of the right shoulder the swelling is less likely a septic joint. Recommend starting colchicine 0.6 mg twice a day after uric acid is obtained. I will follow-up as needed.  Thank you for the consult and the opportunity to see Ms. Cassandria Santee, MD [facility] [phone] 10:23 AM

## 2015-03-25 NOTE — Progress Notes (Signed)
PROGRESS NOTE  Debbie [name] [id number] DOB: [birth year] DOA: 03/13/2015 PCP: No PCP Per Patient   Subjective: Denies any new complaints, denies any fever or chills. Minor aches in her right shoulder. She declined any surgery, okay was placed in the central line. ID recommended ampicillin initially for 10 more days, now she is going enterococcus from shoulder and not going for surgery, await final recommendation.  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 Jacques Navy appointment 02/16/15) presented to [facility] with 1 day history of dizziness and generalized weakness. The patient was recently discharged from the hospital on 02/21/2015 after treatment for NSTEMI and acute on chronic renal failure. The patient states that she has had poor by mouth intake for the past 2-3 days with constipation. Upon presentation, she was noted to have a serum creatinine of 4.31. The patient received 2 L bolus of fluid in the emergency department. She was also found to have INR of 8.14. On the afternoon of 03/14/2015, the patient developed substernal chest pain for which she received nitroglycerin with relief. Cardiology was consulted. EKG showed a new LBBB.  Assessment/Plan:  Acute on chronic renal failure (CKD 4) in pt with renal transplant -Due to volume depletion and infection -Creatinine baseline 2.2 on 1/30. Admitted with creatinine of 4.31 and is gradually improving (3.41 on  2/23). -Patient endorses compliance with all her medications including immunosuppressive therapy -Renal ultrasound: Native kidneys not identified possibly due to atrophy. Transplanted kidney without hydronephrosis. -Follow renal function closely, currently around baseline at 2.7.  Right shoulder septic arthritis -Enterococcus acute septic arthritis of the right shoulder, shoulder aspirate growing enterococcus -Dr. Sharol Given recommended surgery with I&D and antibiotic wash, patient declined. -Patient wants only antibiotics, I explained to her the infection might not go away only with antibiotics she is agreeable.  Enterococcus bacteremia -Source unclear. 2/20: 1/2 blood cultures positive for enterococcus. 2nd blood culture positive for enterococcus and Coag neg staphylococcus. 2/22: Blood culture 2: Also positive - Infectious disease follow-up appreciated. ID recommends TEE - patient had been contemplating and reluctant all along but has finally consented to having it on 2/26. - Cubicin changed to IV ampicillin & Rocephin. Blood culture from 2/24 still NGTD. - ID recommended ampicillin initially for 10 days, now she has septic arthritis await recommendation  Mild hyperkalemia -Resolved after a dose of Kayexalate.  Chest pain and new LBBB on EKG -Concerning for angina with new left bundle branch block -relieved after SL NTG x 1 -EKG--new LBBB -pt had NSTEMI last admission in Jan 2017 -02/15/2015 echo EF 40-45%, PAP 60 -continue ASA -Continue imdur/Ranexa -Defer Ranexa pharmacy concern to cardiology -Elevated troponins likely due to renal failure -Cardiology follow-up appreciated: Suspect anemia contribute it to her angina. Continue carvedilol, Lipitor, Imdur and Ranexa. Cardiology does not recommend any further workup and had signed off 2/22. However due to recurrent NTG responsive chest pain, cardiology has seen again on 2/23. EKG without acute changes and no LBBB at this time (LBBB is  intermittent).  -Cardiology has increased dose of Imdur and beta blocker. Ranexa  held secondary to renal insufficiency-consider resuming as renal function improves.  Right upper extremity pain and swelling - Reported on 2/23. Right upper extremity distal to elbow does appear diffusely swollen but not warm and is also tender without other features of cellulitis or arthritis. Good radial pulsation felt.? Secondary to IV fluids versus rule out DVT versus gout flare. - Elevated right upper extremity. Requested charge nurse to cut restricting band on right wrist. Right upper extremity venous Doppler negative for DVT. Did receive a dose of prednisone 40 mg on 2/23. Right upper extremity swelling and pain improved. As per nephrology, she may need further evaluation for central vein stenosis-has right upper extremity AV graft. - CT shows right shoulder effusion. Requested orthopedic/Dr. Sharol Given consultation appreciated: discussed with Dr. Sharol Given 2/26 and he was requested IR aspirate right shoulder.   Hyponatremia -Secondary to volume depletion -Sodium normal today.  Paroxysmal atrial fibrillation -Presently in sinus rhythm -Rate controlled -Continue warfarin. INR therapeutic.  Warfarin-induced coagulopathy -Patient has supratherapeutic INR--8.43 on admission. Decreased. -As the patient is not actively bleeding and hemodynamically stable, allow INR to drift down -vitamin K if bleed or INR continues to rise  Hypertension -Continue carvedilol and Imdur. Dose increased by cardiology  SLE/positive anticardiolipin antibodies/history of DVT/PE:  -On chronic Coumadin at home. INR as above.. Status post renal transplant with chronic allograft nephropathy: Continue immunosuppressants  Anemia of CKD -Baseline hemoglobin 9-10 -Transfuse one unit PRBC in setting of drop in Hgb and chest pain. Stable.  Right fibular and medial malleolus fracture -As per cardiology, patient would be high risk for surgery with  a recent NSTEMI -Continue CAM boot. -Outpt follow up with Dr. Ninfa Linden   DVT prophylaxis: Anticoagulated on Coumadin. CODE STATUS: Full Family Communication: None at bedside Disposition Plan: DC home when medically stable.    Procedures/Studies: Ct Head Wo Contrast  03/14/2015  CLINICAL DATA:  Acute onset of dizziness.  Initial encounter. EXAM: CT HEAD WITHOUT CONTRAST TECHNIQUE: Contiguous axial images were obtained from the base of the skull through the vertex without intravenous contrast. COMPARISON:  CT of the head performed 02/14/2015 FINDINGS: There is no evidence of acute infarction, mass lesion, or intra- or extra-axial hemorrhage on CT. Prominence of the ventricles sulci reflects mild cortical volume loss. The brainstem and fourth ventricle are within normal limits. The basal ganglia are unremarkable in appearance. The cerebral hemispheres demonstrate grossly normal gray-white differentiation. No mass effect or midline shift is seen. There is no evidence of fracture; visualized osseous structures are unremarkable in appearance. The visualized portions of the orbits are within normal limits. The paranasal sinuses and mastoid air cells are well-aerated. No significant soft tissue abnormalities are seen. IMPRESSION: 1. No acute intracranial pathology seen on CT. 2. Mild cortical volume loss noted. Electronically Signed   By: Garald Balding M.D.   On: 03/14/2015 00:35   Ct Humerus Right Wo Contrast  03/18/2015  CLINICAL DATA:  [place] 3-4 days ago.  Injured right upper extremity. EXAM: CT OF THE RIGHT SHOULDER WITHOUT CONTRAST; CT OF THE RIGHT HUMERUS WITHOUT CONTRAST; CT OF THE RIGHT FOREARM WITHOUT CONTRAST TECHNIQUE: Multidetector CT imaging was performed according to the standard protocol. Multiplanar CT image reconstructions were also generated. COMPARISON:  None. FINDINGS: No acute fractures are identified. The shoulder joint is maintained. No fracture or dislocation. Mild AC joint  degenerative changes. No AC joint separation. No humeral fracture. There is extensive subacromial/subdeltoid and subscapularis bursitis. There is also a large glenohumeral joint effusion and severe synovitis. Dialysis grafts  are noted in the right upper extremity. The elbow joint is maintained. No elbow fracture or obvious joint effusion. Headache cardiac No forearm fractures are identified. There is a moderate-sized right pleural effusion with overlying atelectasis. No obvious acute right rib fractures are identified. The thoracic vertebral bodies are intact. Moderate degenerative changes noted at the sternoclavicular joints. Small scarred right kidney is noted. There is advanced atherosclerotic calcifications involving the aorta and branch vessels. IMPRESSION: No acute fractures of the right upper extremity are identified. The joints are maintained. Extensive synovitis, joint effusion and bursitis involving the right shoulder. Electronically Signed   By: Marijo Sanes M.D.   On: 03/18/2015 09:11   Ct Shoulder Right Wo Contrast  03/18/2015  CLINICAL DATA:  [place] 3-4 days ago.  Injured right upper extremity. EXAM: CT OF THE RIGHT SHOULDER WITHOUT CONTRAST; CT OF THE RIGHT HUMERUS WITHOUT CONTRAST; CT OF THE RIGHT FOREARM WITHOUT CONTRAST TECHNIQUE: Multidetector CT imaging was performed according to the standard protocol. Multiplanar CT image reconstructions were also generated. COMPARISON:  None. FINDINGS: No acute fractures are identified. The shoulder joint is maintained. No fracture or dislocation. Mild AC joint degenerative changes. No AC joint separation. No humeral fracture. There is extensive subacromial/subdeltoid and subscapularis bursitis. There is also a large glenohumeral joint effusion and severe synovitis. Dialysis grafts are noted in the right upper extremity. The elbow joint is maintained. No elbow fracture or obvious joint effusion. Headache cardiac No forearm fractures are identified. There is a  moderate-sized right pleural effusion with overlying atelectasis. No obvious acute right rib fractures are identified. The thoracic vertebral bodies are intact. Moderate degenerative changes noted at the sternoclavicular joints. Small scarred right kidney is noted. There is advanced atherosclerotic calcifications involving the aorta and branch vessels. IMPRESSION: No acute fractures of the right upper extremity are identified. The joints are maintained. Extensive synovitis, joint effusion and bursitis involving the right shoulder. Electronically Signed   By: Marijo Sanes M.D.   On: 03/18/2015 09:11   Ct Forearm Right Wo Contrast  03/18/2015  CLINICAL DATA:  [place] 3-4 days ago.  Injured right upper extremity. EXAM: CT OF THE RIGHT SHOULDER WITHOUT CONTRAST; CT OF THE RIGHT HUMERUS WITHOUT CONTRAST; CT OF THE RIGHT FOREARM WITHOUT CONTRAST TECHNIQUE: Multidetector CT imaging was performed according to the standard protocol. Multiplanar CT image reconstructions were also generated. COMPARISON:  None. FINDINGS: No acute fractures are identified. The shoulder joint is maintained. No fracture or dislocation. Mild AC joint degenerative changes. No AC joint separation. No humeral fracture. There is extensive subacromial/subdeltoid and subscapularis bursitis. There is also a large glenohumeral joint effusion and severe synovitis. Dialysis grafts are noted in the right upper extremity. The elbow joint is maintained. No elbow fracture or obvious joint effusion. Headache cardiac No forearm fractures are identified. There is a moderate-sized right pleural effusion with overlying atelectasis. No obvious acute right rib fractures are identified. The thoracic vertebral bodies are intact. Moderate degenerative changes noted at the sternoclavicular joints. Small scarred right kidney is noted. There is advanced atherosclerotic calcifications involving the aorta and branch vessels. IMPRESSION: No acute fractures of the right upper  extremity are identified. The joints are maintained. Extensive synovitis, joint effusion and bursitis involving the right shoulder. Electronically Signed   By: Marijo Sanes M.D.   On: 03/18/2015 09:11   US Renal  03/15/2015  CLINICAL DATA:  Acute renal failure superimposed on stage 4 chronic kidney disease. EXAM: RENAL / URINARY TRACT ULTRASOUND COMPLETE COMPARISON:  Included portions from  chest CT 02/18/2015 FINDINGS: Right Kidney: Not visualized. Left Kidney: Not visualized. Transplant kidney located in the pelvis, side not specified. There is no transplant hydronephrosis. Blood flow seen. Transplant kidney measures 10 cm. Transplant cortical thickness appears maintained. No focal lesion. Detailed transplant Doppler evaluation not performed. Bladder: Appears normal for degree of bladder distention. IMPRESSION: 1. Native kidneys not identified, likely secondary renal atrophy. 2. Pelvic transplant kidney without hydronephrosis. Electronically Signed   By: Jeb Levering M.D.   On: 03/15/2015 23:08   Dg Chest Portable 1 View  03/14/2015  CLINICAL DATA:  Fever and weakness tonight. EXAM: PORTABLE CHEST 1 VIEW COMPARISON:  Radiograph 02/18/2015.  Chest CT 02/16/2015 FINDINGS: Cardiomediastinal contours are unchanged allowing for patient rotation. Small left pleural effusion, diminished from prior. No evidence of right pleural effusion. Left basilar scarring. No pulmonary edema. No confluent airspace disease. No pneumothorax. IMPRESSION: Persistent but decreased left pleural effusion. Exam is otherwise unchanged. Electronically Signed   By: Jeb Levering M.D.   On: 03/14/2015 02:09   Dg Fluoro Guided Needle Plc Aspiration/injection Loc  03/21/2015  CLINICAL DATA:  Sinusitis, joint effusion and bursitis involving the right shoulder on CT 03/17/2015. EXAM: RIGHT SHOULDER ASPIRATION UNDER FLUOROSCOPY TECHNIQUE: Despite an INR of 3.99, Dr. Meridee Score insisted on a relatively emergent right shoulder  aspiration due to the possibility of sepsis. The increased risk of bleeding was discussed with the patient prior to the procedure, as well as the risk of infection. Patient elected to proceed with the procedure. Informed consent was obtained. An appropriate skin entrance site was determined. The site was marked, prepped with Betadine, draped in the usual sterile fashion, and infiltrated locally with buffered Lidocaine. Based on CT imaging, 80 20 gauge spinal needle was inserted over the mid humeral head and 5-60 cc of serosanguineous fluid returned. No immediate complication. FLUOROSCOPY TIME:  Radiation Exposure Index (as provided by the fluoroscopic device): If the device does not provide the exposure index: Fluoroscopy Time (in minutes and seconds):  0 minutes 24 seconds. Number of Acquired Images:  None. FINDINGS: Despite an INR of 3.99, Dr. Meridee Score insisted on a relatively emergent right shoulder aspiration due to the possibility of sepsis. The increased risk of bleeding was discussed with the patient prior to the procedure, as well as the risk of infection. Patient elected to proceed with the procedure. Informed consent was obtained. An appropriate skin entrance site was determined. The site was marked, prepped with Betadine, draped in the usual sterile fashion, and infiltrated locally with buffered Lidocaine. Based on CT imaging, a 20 gauge spinal needle was inserted over the mid humeral head with return of 5-6 cc of serosanguineous fluid. No immediate complication. IMPRESSION: Technically successful right shoulder aspiration. Electronically Signed   By: Lorin Picket M.D.   On: 03/21/2015 15:16        Objective: Filed Vitals:   03/25/15 0258 03/25/15 0434 03/25/15 0806 03/25/15 0917  BP: 133/54 139/76 136/59 133/55  Pulse: 79 79 84 78  Temp: 98.3 F (36.8 C) 98.3 F (36.8 C)  98.1 F (36.7 C)  TempSrc: Oral Oral  Oral  Resp: 20 20  18   Height:      Weight:      SpO2: 97% 99% 100%  96%    Intake/Output Summary (Last 24 hours) at 03/25/15 1447 Last data filed at 03/25/15 0600  Gross per 24 hour  Intake   1427 ml  Output      0 ml  Net  1427 ml   Weight change:  Exam:   General:  Pt is alert, follows commands appropriately, not in acute distress.   HEENT: No icterus, No thrush, No neck mass, [place]/AT  Cardiovascular: RRR, S1/S2, no rubs, no gallops. Telemetry: Sinus rhythm.  Respiratory: CTA bilaterally, no wheezing, no crackles, no rhonchi  Abdomen: Soft/+BS, non tender, non distended, no guarding  Extremities: 1+RLE edema, No lymphangitis, No petechiae, No rashes, no synovitis. Right leg in cam boot. Right upper extremity swelling improved some. No other acute findings.  Data Reviewed: Basic Metabolic Panel:  Recent Labs Lab 03/19/15 0553 03/20/15 1115 03/21/15 0527 03/22/15 1416 03/25/15 1034  NA 138 139 137 139 138  K 4.8 4.8 4.8 5.0 4.6  CL 106 105 106 103 101  CO2 22 23 21* 23 25  GLUCOSE 151* 118* 135* 146* 138*  BUN 59* 54* 53* 46* 44*  CREATININE 2.34* 2.28* 2.25* 2.34* 2.73*  CALCIUM 9.4 9.8 9.1 9.8 10.0  PHOS 2.5 2.3* 2.5 2.4*  --    Liver Function Tests:  Recent Labs Lab 03/19/15 0553 03/20/15 1115 03/21/15 0527 03/22/15 1416  ALBUMIN 1.9* 2.0* 2.0* 2.1*   No results for input(s): LIPASE, AMYLASE in the last 168 hours. No results for input(s): AMMONIA in the last 168 hours. CBC:  Recent Labs Lab 03/22/15 1416 03/22/15 1641 03/23/15 0621 03/24/15 0554 03/25/15 0614  WBC 7.6 7.8 5.7 7.2 6.8  NEUTROABS  --  5.9  --   --   --   HGB 9.5* 8.8* 9.9* 9.2* 7.9*  HCT 28.9* 28.0* 32.1* 27.6* 23.8*  MCV 85.3 84.6 84.3 85.4 85.3  PLT 147* 136* 69* 150 141*   Cardiac Enzymes: No results for input(s): CKTOTAL, CKMB, CKMBINDEX, TROPONINI in the last 168 hours. BNP: Invalid input(s): POCBNP CBG: No results for input(s): GLUCAP in the last 168 hours.  Recent Results (from the past 240 hour(s))  Culture, blood (Routine X 2)  w Reflex to ID Panel     Status: None   Collection Time: 03/16/15  5:00 AM  Result Value Ref Range Status   Specimen Description BLOOD LEFT HAND  Final   Special Requests AEROBIC BOTTLE ONLY 5ML  Final   Culture  Setup Time   Final    GRAM POSITIVE COCCI IN PAIRS IN CHAINS AEROBIC BOTTLE ONLY CRITICAL RESULT CALLED TO, READ BACK BY AND VERIFIED WITH: A. LOWE,RN AT [id number] ON [id number] BY Rhea Bleacher    Culture   Final    ENTEROCOCCUS SPECIES SUSCEPTIBILITIES PERFORMED ON PREVIOUS CULTURE WITHIN THE LAST 5 DAYS.    Report Status 03/19/2015 FINAL  Final  Culture, blood (Routine X 2) w Reflex to ID Panel     Status: None   Collection Time: 03/16/15  5:04 AM  Result Value Ref Range Status   Specimen Description BLOOD LEFT FINGER  Final   Special Requests IN PEDIATRIC BOTTLE  Final   Culture  Setup Time   Final    GRAM POSITIVE COCCI IN CLUSTERS IN PAIRS AEROBIC BOTTLE ONLY CRITICAL RESULT CALLED TO, READ BACK BY AND VERIFIED WITHBarnabas Harries RN [id number] 03/18/15 A BROWNING    Culture   Final    GRANULICATELLA ELEGANS THIS IS A NUTRTIONALLY VARIANT STREPTOCOCCUS SENSITIVITIES ARE NOT PERFORMED NOTIFIED DR. Hartford Poli AT [id number] ON [id number] BY Rhea Bleacher    Report Status 03/23/2015 FINAL  Final  Culture, blood (Routine X 2) w Reflex to ID Panel     Status: None   Collection Time: 03/18/15  8:54 AM  Result Value Ref Range Status   Specimen Description BLOOD BLOOD LEFT HAND  Final   Special Requests IN PEDIATRIC BOTTLE 2.5CC  Final   Culture NO GROWTH 5 DAYS  Final   Report Status 03/23/2015 FINAL  Final  Culture, blood (Routine X 2) w Reflex to ID Panel     Status: None   Collection Time: 03/18/15  9:01 AM  Result Value Ref Range Status   Specimen Description BLOOD BLOOD LEFT HAND  Final   Special Requests IN PEDIATRIC BOTTLE 1CC  Final   Culture NO GROWTH 5 DAYS  Final   Report Status 03/23/2015 FINAL  Final  Body fluid culture     Status: None (Preliminary result)   Collection Time: 03/21/15   2:00 PM  Result Value Ref Range Status   Specimen Description FLUID SYNOVIAL RIGHT SHOULDER  Final   Special Requests NONE  Final   Gram Stain   Final    ABUNDANT WBC PRESENT, PREDOMINANTLY PMN NO ORGANISMS SEEN    Culture   Final    FEW ENTEROCOCCUS SPECIES CRITICAL RESULT CALLED TO, READ BACK BY AND VERIFIED WITH: R HILL,RN AT 1324 03/24/15 BY L BENFIELD    Report Status PENDING  Incomplete   Organism ID, Bacteria ENTEROCOCCUS SPECIES  Final      Susceptibility   Enterococcus species - MIC*    AMPICILLIN <=2 SENSITIVE Sensitive     VANCOMYCIN 1 SENSITIVE Sensitive     GENTAMICIN SYNERGY SENSITIVE Sensitive     * FEW ENTEROCOCCUS SPECIES  C difficile quick scan w PCR reflex     Status: None   Collection Time: 03/25/15  2:53 AM  Result Value Ref Range Status   C Diff antigen NEGATIVE NEGATIVE Final   C Diff toxin NEGATIVE NEGATIVE Final   C Diff interpretation Negative for toxigenic C. difficile  Final     Scheduled Meds: . allopurinol  200 mg Oral Daily  . amLODipine  5 mg Oral Daily  . ampicillin (OMNIPEN) IV  2 g Intravenous 3 times per day  . atorvastatin  40 mg Oral q1800  . calcitRIOL  0.25 mcg Oral QODAY  . calcitRIOL  0.5 mcg Oral QODAY  . carvedilol  25 mg Oral BID WC  . darbepoetin (ARANESP) injection - NON-DIALYSIS  100 mcg Subcutaneous Q Sat-1800  . famotidine  20 mg Oral Daily  . feeding supplement (NEPRO CARB STEADY)  237 mL Oral BID BM  . feeding supplement (PRO-STAT SUGAR FREE 64)  30 mL Oral Daily  . isosorbide mononitrate  90 mg Oral Daily  . latanoprost  1 drop Both Eyes QHS  . magnesium oxide  400 mg Oral BID  . mycophenolate  180 mg Oral BID  . predniSONE  5 mg Oral Q breakfast  . ranolazine  500 mg Oral BID  . sodium bicarbonate  1,300 mg Oral BID  . tacrolimus  2 mg Oral BID  . warfarin  1.25 mg Oral ONCE-1800  . Warfarin - Pharmacist Dosing Inpatient   Does not apply q1800   Continuous Infusions:   [facility] [facility] Pager  [phone]  If 7PM-7AM, please contact night-coverage [email or web address] Password [email or web address] 03/25/2015, 2:47 PM   LOS: 11 days

## 2015-03-25 NOTE — Progress Notes (Signed)
Patient had central line placed by critical care team this afternoon. After [facility] completed it was noted by Dr. Almyra Free in radiology that the central line was in the patient's right brachiocephalic vein and needed to be pulled back 5cm before being advanced again. [name], NP was notified and stated she would be up to fix it.  Joellen Jersey, RN.

## 2015-03-26 ENCOUNTER — Inpatient Hospital Stay (HOSPITAL_COMMUNITY): Payer: Medicare Other

## 2015-03-26 LAB — BASIC METABOLIC PANEL
Anion gap: 10 (ref 5–15)
BUN: 50 mg/dL — ABNORMAL HIGH (ref 6–20)
CALCIUM: 9.9 mg/dL (ref 8.9–10.3)
CO2: 28 mmol/L (ref 22–32)
CREATININE: 2.81 mg/dL — AB (ref 0.44–1.00)
Chloride: 101 mmol/L (ref 101–111)
GFR calc non Af Amer: 17 mL/min — ABNORMAL LOW (ref >60)
GFR, EST AFRICAN AMERICAN: 19 mL/min — AB (ref >60)
GLUCOSE: 131 mg/dL — AB (ref 65–99)
Potassium: 4.5 mmol/L (ref 3.5–5.1)
Sodium: 139 mmol/L (ref 135–145)

## 2015-03-26 LAB — BODY FLUID CULTURE

## 2015-03-26 LAB — PREPARE FRESH FROZEN PLASMA
UNIT DIVISION: 0
Unit division: 0
Unit division: 0
Unit division: 0

## 2015-03-26 LAB — CBC
HEMATOCRIT: 24.8 % — AB (ref 36.0–46.0)
Hemoglobin: 7.7 g/dL — ABNORMAL LOW (ref 12.0–15.0)
MCH: 26.6 pg (ref 26.0–34.0)
MCHC: 31 g/dL (ref 30.0–36.0)
MCV: 85.8 fL (ref 78.0–100.0)
PLATELETS: 157 10*3/uL (ref 150–400)
RBC: 2.89 MIL/uL — ABNORMAL LOW (ref 3.87–5.11)
RDW: 17.5 % — AB (ref 11.5–15.5)
WBC: 7 10*3/uL (ref 4.0–10.5)

## 2015-03-26 LAB — GLUCOSE, CAPILLARY
Glucose-Capillary: 118 mg/dL — ABNORMAL HIGH (ref 65–99)
Glucose-Capillary: 126 mg/dL — ABNORMAL HIGH (ref 65–99)

## 2015-03-26 LAB — PROTIME-INR
INR: 1.46 (ref 0.00–1.49)
Prothrombin Time: 17.8 seconds — ABNORMAL HIGH (ref 11.6–15.2)

## 2015-03-26 MED ORDER — FUROSEMIDE 10 MG/ML IJ SOLN
40.0000 mg | Freq: Once | INTRAMUSCULAR | Status: AC
  Administered 2015-03-26: 40 mg via INTRAVENOUS
  Filled 2015-03-26: qty 4

## 2015-03-26 MED ORDER — SODIUM CHLORIDE 0.9% FLUSH
10.0000 mL | INTRAVENOUS | Status: DC | PRN
Start: 2015-03-26 — End: 2015-03-28
  Administered 2015-03-26: 10 mL
  Administered 2015-03-27: 30 mL
  Administered 2015-03-28 (×2): 10 mL
  Administered 2015-03-28: 20 mL
  Administered 2015-03-28: 10 mL
  Filled 2015-03-26 (×6): qty 40

## 2015-03-26 MED ORDER — WARFARIN SODIUM 2.5 MG PO TABS
1.2500 mg | ORAL_TABLET | Freq: Once | ORAL | Status: AC
  Administered 2015-03-26: 1.25 mg via ORAL
  Filled 2015-03-26: qty 0.5

## 2015-03-26 NOTE — Progress Notes (Signed)
ANTICOAGULATION CONSULT NOTE - Follow Up Consult  Pharmacy Consult for warfarin Indication: hypercoagulable state and hx of DVT  Allergies  Allergen Reactions  . Feraheme [Ferumoxytol] Other (See Comments)    Chest pain, pulsating 02/17/15: tolerated Nulecit  . Vancomycin Anaphylaxis, Itching, Swelling and Other (See Comments)    Tongue swell  . Dorzolamide Hcl-Timolol Mal Rash and Other (See Comments)    Eye pain  . Gentamycin [Gentamicin Sulfate] Itching and Swelling  . Latanoprost Rash and Other (See Comments)    Eye pain  . Cefazolin Itching  . Codeine Itching  . Hydrocodone-Acetaminophen Itching  . Penicillins Itching    *tolerated ampicillin during 02/2015 admission Has patient had a PCN reaction causing immediate rash, facial/tongue/throat swelling, SOB or lightheadedness with hypotension:No Has patient had a PCN reaction causing severe rash involving mucus membranes or skin necrosis:No Has patient had a PCN reaction that required hospitalization:No Has patient had a PCN reaction occurring within the last 10 years:No If all of the above answers are "NO", then may proceed with Cephalosporin use.     Patient Measurements: Height: 5\' 1"  (154.9 cm) Weight: 147 lb 11.3 oz (67 kg) IBW/kg (Calculated) : 47.8  Vital Signs: Temp: 98.9 F (37.2 C) (03/04 0604) Temp Source: Oral (03/04 0604) BP: 144/56 mmHg (03/04 0604) Pulse Rate: 83 (03/04 0604)  Labs:  Recent Labs  03/24/15 0554 03/25/15 0614 03/25/15 1034 03/26/15 0940  HGB 9.2* 7.9*  --  7.7*  HCT 27.6* 23.8*  --  24.8*  PLT 150 141*  --  157  LABPROT 34.3* 18.5*  --  17.8*  INR 3.49* 1.54*  --  1.46  CREATININE  --   --  2.73* 2.81*    Estimated Creatinine Clearance: 17.5 mL/min (by C-G formula based on Cr of 2.81).   Medications:  Prescriptions prior to admission  Medication Sig Dispense Refill Last Dose  . acetaminophen (TYLENOL) 325 MG tablet Take 2 tablets (650 mg total) by mouth every 6 (six) hours  as needed for mild pain (or Fever >/= 101).   Past Month at Unknown time  . allopurinol (ZYLOPRIM) 100 MG tablet Take 2 tablets (200 mg total) by mouth daily.   03/13/2015 at Unknown time  . atorvastatin (LIPITOR) 40 MG tablet Take 1 tablet (40 mg total) by mouth daily at 6 PM. 30 tablet 0 03/12/2015 at Unknown time  . bimatoprost (LUMIGAN) 0.01 % SOLN Place 1 drop into both eyes at bedtime.    03/12/2015 at Unknown time  . Brinzolamide-Brimonidine (SIMBRINZA) 1-0.2 % SUSP Place 1 drop into the left eye 3 (three) times daily.   03/13/2015 at Unknown time  . calcitRIOL (ROCALTROL) 0.25 MCG capsule Take 0.25-0.5 mcg by mouth every other day. alternating dosages   03/13/2015 at Unknown time  . carvedilol (COREG) 6.25 MG tablet Take 1 tablet (6.25 mg total) by mouth 2 (two) times daily with a meal. 60 tablet 0 03/13/2015 at 1300  . furosemide (LASIX) 40 MG tablet Take 1 tablet (40 mg total) by mouth daily. 30 tablet 0 03/13/2015 at Unknown time  . isosorbide mononitrate (IMDUR) 60 MG 24 hr tablet Take 1 tablet (60 mg total) by mouth daily. 30 tablet 0 03/13/2015 at Unknown time  . MAGNESIUM-OXIDE 400 (241.3 MG) MG tablet TAKE 1 TABLET BY MOUTH TWICE DAILY 60 tablet 3 03/13/2015 at AM  . mycophenolate (MYFORTIC) 180 MG EC tablet Take 180 mg by mouth 2 (two) times daily.    03/13/2015 at AM  . nitroGLYCERIN (  NITROSTAT) 0.4 MG SL tablet Place 0.4 mg under the tongue every 5 (five) minutes as needed for chest pain.    not used  . oxyCODONE-acetaminophen (PERCOCET/ROXICET) 5-325 MG tablet Take 1 tablet by mouth every 6 (six) hours as needed for moderate pain or severe pain. (Patient taking differently: Take 0.5-1 tablets by mouth every 6 (six) hours as needed for moderate pain or severe pain. ) 20 tablet 0 03/12/2015 at Unknown time  . predniSONE (DELTASONE) 5 MG tablet Take 5 mg by mouth daily with breakfast.    03/13/2015 at Unknown time  . RANEXA 500 MG 12 hr tablet TAKE 1 TABLET BY MOUTH TWICE DAILY 180 tablet 1  03/13/2015 at AM  . ranitidine (ZANTAC) 150 MG tablet Take 150 mg by mouth daily.    03/13/2015 at Unknown time  . sodium bicarbonate 650 MG tablet Take 1,300 mg by mouth 2 (two) times daily.    03/13/2015 at Unknown time  . tacrolimus (PROGRAF) 1 MG capsule Take 2 mg by mouth 2 (two) times daily.    03/13/2015 at Unknown time  . warfarin (COUMADIN) 2.5 MG tablet Take 0.5-1 tablets (1.25-2.5 mg total) by mouth daily at 6 PM. Monday and Friday 2.5 mg and 1.25 mg on all other days 30 tablet 0 03/12/2015 at Unknown time  . labetalol (NORMODYNE) 100 MG tablet Take 100 mg by mouth 2 (two) times daily. Reported on 03/13/2015   Completed Course at 03/13/15  . methocarbamol (ROBAXIN) 500 MG tablet Take 1 tablet (500 mg total) by mouth 2 (two) times daily. (Patient not taking: Reported on 03/03/2015) 20 tablet 0 Not Taking  . timolol (TIMOPTIC) 0.5 % ophthalmic solution Place 1 drop into both eyes 2 (two) times daily. Reported on 03/13/2015   Not Taking at Unknown time    Assessment: 65 yo F admitted 03/13/2015 on warfarin PTA for hypercoagulable state and hx of DVT. Warfarin was held and patient received Vit K during this admission in preparation for I&D however patient refused procedure. Warfarin resumed 3/3.  INR 1.46 (subtherapeutic), Hgb 7.7, Plt wnl. No s/sx of bleeding noted.  Goal of Therapy:  INR 2-3 Monitor platelets by anticoagulation protocol: Yes   Plan:  - Warfarin 1.25 mg x1 tonight - Monitor daily INR, CBC and s/sx of bleeding  Dimitri Ped, PharmD. PGY-1 Pharmacy Resident Pager: [phone] 03/26/2015,10:57 AM

## 2015-03-26 NOTE — Progress Notes (Signed)
PROGRESS NOTE  Debbie [name] [id number] DOB: 1950-08-11 DOA: 03/13/2015 PCP: No PCP Per Patient   Subjective: Denies significant changes overnight. I appreciated CCM service, placed left IJ central line on 03/25/2015 Complains about minimal pain in her right shoulder. Initially patient wants to go home, now family to secure to SNF, await the availability.  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 Jacques Navy appointment 02/16/15) presented to [facility] with 1 day history of dizziness and generalized weakness. The patient was recently discharged from the hospital on 02/21/2015 after treatment for NSTEMI and acute on chronic renal failure. The patient states that she has had poor by mouth intake for the past 2-3 days with constipation. Upon presentation, she was noted to have a serum creatinine of 4.31. The patient received 2 L bolus of fluid in the emergency department. She was also found to have INR of 8.14. On the afternoon of 03/14/2015, the patient developed substernal chest pain for which she received nitroglycerin with relief. Cardiology was consulted. EKG showed a new LBBB.  Assessment/Plan:  Acute on chronic renal failure (CKD 4) in pt with renal transplant -Due to volume depletion and infection -Creatinine baseline 2.2 on 1/30. Admitted with creatinine of 4.31 and is gradually improving (3.41 on 2/23). -Patient endorses compliance with all her medications  including immunosuppressive therapy -Renal ultrasound: Native kidneys not identified possibly due to atrophy. Transplanted kidney without hydronephrosis. -Follow renal function closely, currently around baseline at 2.7.  Right shoulder septic arthritis -Enterococcus acute septic arthritis of the right shoulder, shoulder aspirate growing enterococcus -Dr. Sharol Given recommended surgery with I&D and antibiotic wash, patient declined any surgery. -Patient wants only antibiotics, I explained to her the infection might not go away completely with only antibiotics.  -Per ID 1 more month of ampicillin  Enterococcus bacteremia -Source unclear. 2/20: 1/2 blood cultures positive for enterococcus. 2nd blood culture positive for enterococcus and Coag neg staphylococcus. 2/22: Blood culture 2: Also positive - Infectious disease follow-up appreciated. ID recommends TEE - patient had been contemplating and reluctant all along but has finally consented to having it on 2/26. - Cubicin changed to IV ampicillin & Rocephin. Blood culture from 2/24 still NGTD.  Mild hyperkalemia -Resolved after a dose of Kayexalate.  Chest pain and new LBBB on EKG -Concerning for angina with new left bundle branch block -relieved after SL NTG x 1 -EKG--new LBBB -pt had NSTEMI last admission in Jan 2017 -02/15/2015 echo EF 40-45%, PAP 60 -continue ASA -Continue imdur/Ranexa -Defer Ranexa pharmacy concern to cardiology -Elevated troponins likely due to renal failure -Cardiology follow-up appreciated: Suspect anemia contribute it to her angina. Continue carvedilol, Lipitor, Imdur and Ranexa. Cardiology does not recommend any further workup and had signed off 2/22. However due to recurrent NTG responsive chest pain, cardiology has seen again on 2/23. EKG without acute changes and no LBBB at this time (LBBB is intermittent).  -Cardiology has increased dose of Imdur and beta blocker. Ranexa held secondary to renal  insufficiency-consider resuming as renal function improves.  Right upper extremity pain and swelling -CT  scan of the shoulder showed effusion, arthrocentesis showed septic arthritis. -The swelling secondary to septic arthritis and the innominate vein stenosis.  Hyponatremia -Secondary to volume depletion -Sodium normal today.  Paroxysmal atrial fibrillation -Presently in sinus rhythm -Rate controlled -Continue warfarin. INR therapeutic.  Warfarin-induced coagulopathy -Patient has supratherapeutic INR--8.43 on admission. Decreased. -As the patient is not actively bleeding and hemodynamically stable, allow INR to drift down -vitamin K if bleed or INR continues to rise  Hypertension -Continue carvedilol and Imdur. Dose increased by cardiology  SLE/positive anticardiolipin antibodies/history of DVT/PE:  -On chronic Coumadin at home. INR as above.. Status post renal transplant with chronic allograft nephropathy: Continue immunosuppressants  Anemia of CKD -Baseline hemoglobin 9-10 -Transfuse one unit PRBC in setting of drop in Hgb and chest pain. Stable.  Right fibular and medial malleolus fracture -As per cardiology, patient would be high risk for surgery with a recent NSTEMI -Continue CAM boot. -Outpt follow up with Dr. Ninfa Linden   DVT prophylaxis: Anticoagulated on Coumadin. CODE STATUS: Full Family Communication: None at bedside Disposition Plan: DC home when medically stable.    Procedures/Studies: Ct Head Wo Contrast  03/14/2015  CLINICAL DATA:  Acute onset of dizziness.  Initial encounter. EXAM: CT HEAD WITHOUT CONTRAST TECHNIQUE: Contiguous axial images were obtained from the base of the skull through the vertex without intravenous contrast. COMPARISON:  CT of the head performed 02/14/2015 FINDINGS: There is no evidence of acute infarction, mass lesion, or intra- or extra-axial hemorrhage on CT. Prominence of the ventricles sulci reflects mild cortical volume loss.  The brainstem and fourth ventricle are within normal limits. The basal ganglia are unremarkable in appearance. The cerebral hemispheres demonstrate grossly normal gray-white differentiation. No mass effect or midline shift is seen. There is no evidence of fracture; visualized osseous structures are unremarkable in appearance. The visualized portions of the orbits are within normal limits. The paranasal sinuses and mastoid air cells are well-aerated. No significant soft tissue abnormalities are seen. IMPRESSION: 1. No acute intracranial pathology seen on CT. 2. Mild cortical volume loss noted. Electronically Signed   By: Garald Balding M.D.   On: 03/14/2015 00:35   Ct Humerus Right Wo Contrast  03/18/2015  CLINICAL DATA:  [place] 3-4 days ago.  Injured right upper extremity. EXAM: CT OF THE RIGHT SHOULDER WITHOUT CONTRAST; CT OF THE RIGHT HUMERUS WITHOUT CONTRAST; CT OF THE RIGHT FOREARM WITHOUT CONTRAST TECHNIQUE: Multidetector CT imaging was performed according to the standard protocol. Multiplanar CT image reconstructions were also generated. COMPARISON:  None. FINDINGS: No acute fractures are identified. The shoulder joint is maintained. No fracture or dislocation. Mild AC joint degenerative changes. No AC joint separation. No humeral fracture. There is extensive subacromial/subdeltoid and subscapularis bursitis. There is also a large glenohumeral joint effusion and severe synovitis. Dialysis grafts are noted in the right upper extremity. The elbow joint is maintained. No elbow fracture or obvious joint effusion. Headache cardiac No forearm fractures are identified. There is a moderate-sized right pleural effusion with overlying atelectasis. No obvious acute right rib fractures are identified. The thoracic vertebral bodies are intact. Moderate degenerative changes noted at the sternoclavicular joints. Small scarred right kidney is noted. There is advanced atherosclerotic calcifications involving the aorta and  branch vessels. IMPRESSION: No acute fractures of the right upper extremity are identified. The joints are maintained. Extensive synovitis, joint effusion and bursitis involving the right shoulder. Electronically Signed   By: Marijo Sanes M.D.   On: 03/18/2015 09:11   Ct Shoulder Right Wo Contrast  03/18/2015  CLINICAL DATA:  [place] 3-4 days ago.  Injured right upper extremity. EXAM: CT OF THE RIGHT SHOULDER WITHOUT CONTRAST; CT OF THE RIGHT HUMERUS WITHOUT CONTRAST; CT OF THE RIGHT FOREARM WITHOUT CONTRAST TECHNIQUE: Multidetector CT imaging was performed according to the standard protocol. Multiplanar CT image reconstructions were also generated. COMPARISON:  None. FINDINGS: No acute fractures are identified. The shoulder joint is maintained. No fracture or dislocation. Mild AC joint degenerative changes. No AC joint separation. No humeral fracture. There is extensive subacromial/subdeltoid and subscapularis bursitis. There is also a large glenohumeral joint effusion and severe synovitis. Dialysis grafts are noted in the right upper extremity. The elbow joint is maintained. No elbow fracture or obvious joint effusion. Headache cardiac No forearm fractures are identified. There is a moderate-sized right pleural effusion with overlying atelectasis. No obvious acute right rib fractures are identified. The thoracic vertebral bodies are intact. Moderate degenerative changes noted at the sternoclavicular joints. Small scarred right kidney is noted. There is advanced atherosclerotic calcifications involving the aorta and branch vessels. IMPRESSION: No acute fractures of the right upper extremity are identified. The joints are maintained. Extensive synovitis, joint effusion and bursitis involving the right shoulder. Electronically Signed   By: Marijo Sanes M.D.   On: 03/18/2015 09:11   Ct Forearm Right Wo Contrast  03/18/2015  CLINICAL DATA:  [place] 3-4 days ago.  Injured right upper extremity. EXAM: CT OF THE RIGHT  SHOULDER WITHOUT CONTRAST; CT OF THE RIGHT HUMERUS WITHOUT CONTRAST; CT OF THE RIGHT FOREARM WITHOUT CONTRAST TECHNIQUE: Multidetector CT imaging was performed according to the standard protocol. Multiplanar CT image reconstructions were also generated. COMPARISON:  None. FINDINGS: No acute fractures are identified. The shoulder joint is maintained. No fracture or dislocation. Mild AC joint degenerative changes. No AC joint separation. No humeral fracture. There is extensive subacromial/subdeltoid and subscapularis bursitis. There is also a large glenohumeral joint effusion and severe synovitis. Dialysis grafts are noted in the right upper extremity. The elbow joint is maintained. No elbow fracture or obvious joint effusion. Headache cardiac No forearm fractures are identified. There is a moderate-sized right pleural effusion with overlying atelectasis. No obvious acute right rib fractures are identified. The thoracic vertebral bodies are intact. Moderate degenerative changes noted at the sternoclavicular joints. Small scarred right kidney is noted. There is advanced atherosclerotic calcifications involving the aorta and branch vessels. IMPRESSION: No acute fractures of the right upper extremity are identified. The joints are maintained. Extensive synovitis, joint effusion and bursitis involving the right shoulder. Electronically Signed   By: Marijo Sanes M.D.   On: 03/18/2015 09:11   US Renal  03/15/2015  CLINICAL DATA:  Acute renal failure superimposed on stage 4 chronic kidney disease. EXAM: RENAL / URINARY TRACT ULTRASOUND COMPLETE COMPARISON:  Included portions from chest CT 02/18/2015 FINDINGS: Right Kidney: Not visualized. Left Kidney: Not visualized. Transplant kidney located in the pelvis, side not specified. There is no transplant hydronephrosis. Blood flow seen. Transplant kidney measures 10 cm. Transplant cortical thickness appears maintained. No focal lesion. Detailed transplant Doppler evaluation  not performed. Bladder: Appears normal for degree of bladder distention. IMPRESSION: 1. Native kidneys not identified, likely secondary renal atrophy. 2. Pelvic transplant kidney without hydronephrosis. Electronically Signed   By: Jeb Levering M.D.   On: 03/15/2015 23:08   Dg Chest Port 1 View  03/26/2015  CLINICAL DATA:  Sepsis.  Pneumonia. EXAM: PORTABLE CHEST 1 VIEW COMPARISON:  March 25, 2015 FINDINGS: No pneumothorax. Diffuse left lung infiltrate is again identified,  obscuring the left heart border, similar in the interval given difference in technique. No other changes. Stable left IJ. IMPRESSION: Stable diffuse left lung infiltrate.  No other changes. Electronically Signed   By: Dorise Bullion III M.D   On: 03/26/2015 09:26   Dg Chest Port 1 View  03/25/2015  CLINICAL DATA:  Central line placement. EXAM: PORTABLE CHEST 1 VIEW COMPARISON:  03/25/2015. FINDINGS: Left IJ central line has been slightly retracted, its tip is at the level of the superior vena cava. Heart size stable. Persistent left lung diffuse infiltrate. Small left pleural effusion. No pneumothorax. IMPRESSION: 1. Left IJ line has been partially retracted, its tip is at the level of the superior vena cava. No pneumothorax. 2.  Diffuse left lung infiltrate again noted. Electronically Signed   By: Marcello Moores  Register   On: 03/25/2015 17:01   Dg Chest Port 1 View  03/25/2015  CLINICAL DATA:  Central line placement EXAM: PORTABLE CHEST 1 VIEW COMPARISON:  03/14/2015 FINDINGS: Left internal jugular central line has been placed and crosses midline to the right side with tip over the anticipated position of the brachiocephalic vein. No pneumothorax. Increased opacity throughout the lingula IMPRESSION: Central line projects with tip over the brachiocephalic vein on the right and should be retracted about 5 cm and re- advanced with repeat imaging. Lingular opacity Critical Value/emergent results were called by telephone at the time of  interpretation on 03/25/2015 at 4:15 pm to Sybil, the patient's nurse, who verbally acknowledged these results. Electronically Signed   By: Skipper Cliche M.D.   On: 03/25/2015 16:17   Dg Chest Portable 1 View  03/14/2015  CLINICAL DATA:  Fever and weakness tonight. EXAM: PORTABLE CHEST 1 VIEW COMPARISON:  Radiograph 02/18/2015.  Chest CT 02/16/2015 FINDINGS: Cardiomediastinal contours are unchanged allowing for patient rotation. Small left pleural effusion, diminished from prior. No evidence of right pleural effusion. Left basilar scarring. No pulmonary edema. No confluent airspace disease. No pneumothorax. IMPRESSION: Persistent but decreased left pleural effusion. Exam is otherwise unchanged. Electronically Signed   By: Jeb Levering M.D.   On: 03/14/2015 02:09   Dg Fluoro Guided Needle Plc Aspiration/injection Loc  03/21/2015  CLINICAL DATA:  Sinusitis, joint effusion and bursitis involving the right shoulder on CT 03/17/2015. EXAM: RIGHT SHOULDER ASPIRATION UNDER FLUOROSCOPY TECHNIQUE: Despite an INR of 3.99, Dr. Meridee Score insisted on a relatively emergent right shoulder aspiration due to the possibility of sepsis. The increased risk of bleeding was discussed with the patient prior to the procedure, as well as the risk of infection. Patient elected to proceed with the procedure. Informed consent was obtained. An appropriate skin entrance site was determined. The site was marked, prepped with Betadine, draped in the usual sterile fashion, and infiltrated locally with buffered Lidocaine. Based on CT imaging, 80 20 gauge spinal needle was inserted over the mid humeral head and 5-60 cc of serosanguineous fluid returned. No immediate complication. FLUOROSCOPY TIME:  Radiation Exposure Index (as provided by the fluoroscopic device): If the device does not provide the exposure index: Fluoroscopy Time (in minutes and seconds):  0 minutes 24 seconds. Number of Acquired Images:  None. FINDINGS: Despite an INR  of 3.99, Dr. Meridee Score insisted on a relatively emergent right shoulder aspiration due to the possibility of sepsis. The increased risk of bleeding was discussed with the patient prior to the procedure, as well as the risk of infection. Patient elected to proceed with the procedure. Informed consent was obtained. An appropriate skin entrance site  was determined. The site was marked, prepped with Betadine, draped in the usual sterile fashion, and infiltrated locally with buffered Lidocaine. Based on CT imaging, a 20 gauge spinal needle was inserted over the mid humeral head with return of 5-6 cc of serosanguineous fluid. No immediate complication. IMPRESSION: Technically successful right shoulder aspiration. Electronically Signed   By: Lorin Picket M.D.   On: 03/21/2015 15:16        Objective: Filed Vitals:   03/25/15 1523 03/25/15 2128 03/26/15 0604 03/26/15 1030  BP: 139/71 121/44 144/56 130/50  Pulse: 89 77 83 81  Temp: 97.9 F (36.6 C) 97.8 F (36.6 C) 98.9 F (37.2 C) 99.3 F (37.4 C)  TempSrc: Oral Oral Oral Oral  Resp: 18 18 20 19   Height:      Weight:      SpO2: 100% 100% 97% 100%    Intake/Output Summary (Last 24 hours) at 03/26/15 1236 Last data filed at 03/26/15 1213  Gross per 24 hour  Intake   1180 ml  Output      0 ml  Net   1180 ml   Weight change:  Exam:   General:  Pt is alert, follows commands appropriately, not in acute distress.   HEENT: No icterus, No thrush, No neck mass, [place]/AT  Cardiovascular: RRR, S1/S2, no rubs, no gallops. Telemetry: Sinus rhythm.  Respiratory: CTA bilaterally, no wheezing, no crackles, no rhonchi  Abdomen: Soft/+BS, non tender, non distended, no guarding  Extremities: 1+RLE edema, No lymphangitis, No petechiae, No rashes, no synovitis. Right leg in cam boot. Right upper extremity swelling improved some. No other acute findings.  Data Reviewed: Basic Metabolic Panel:  Recent Labs Lab 03/20/15 1115 03/21/15 0527  03/22/15 1416 03/25/15 1034 03/26/15 0940  NA 139 137 139 138 139  K 4.8 4.8 5.0 4.6 4.5  CL 105 106 103 101 101  CO2 23 21* 23 25 28   GLUCOSE 118* 135* 146* 138* 131*  BUN 54* 53* 46* 44* 50*  CREATININE 2.28* 2.25* 2.34* 2.73* 2.81*  CALCIUM 9.8 9.1 9.8 10.0 9.9  PHOS 2.3* 2.5 2.4*  --   --    Liver Function Tests:  Recent Labs Lab 03/20/15 1115 03/21/15 0527 03/22/15 1416  ALBUMIN 2.0* 2.0* 2.1*   No results for input(s): LIPASE, AMYLASE in the last 168 hours. No results for input(s): AMMONIA in the last 168 hours. CBC:  Recent Labs Lab 03/22/15 1641 03/23/15 0621 03/24/15 0554 03/25/15 0614 03/26/15 0940  WBC 7.8 5.7 7.2 6.8 7.0  NEUTROABS 5.9  --   --   --   --   HGB 8.8* 9.9* 9.2* 7.9* 7.7*  HCT 28.0* 32.1* 27.6* 23.8* 24.8*  MCV 84.6 84.3 85.4 85.3 85.8  PLT 136* 69* 150 141* 157   Cardiac Enzymes: No results for input(s): CKTOTAL, CKMB, CKMBINDEX, TROPONINI in the last 168 hours. BNP: Invalid input(s): POCBNP CBG:  Recent Labs Lab 03/26/15 0745 03/26/15 1201  GLUCAP 118* 126*    Recent Results (from the past 240 hour(s))  Culture, blood (Routine X 2) w Reflex to ID Panel     Status: None   Collection Time: 03/18/15  8:54 AM  Result Value Ref Range Status   Specimen Description BLOOD BLOOD LEFT HAND  Final   Special Requests IN PEDIATRIC BOTTLE 2.5CC  Final   Culture NO GROWTH 5 DAYS  Final   Report Status 03/23/2015 FINAL  Final  Culture, blood (Routine X 2) w Reflex to ID Panel  Status: None   Collection Time: 03/18/15  9:01 AM  Result Value Ref Range Status   Specimen Description BLOOD BLOOD LEFT HAND  Final   Special Requests IN PEDIATRIC BOTTLE 1CC  Final   Culture NO GROWTH 5 DAYS  Final   Report Status 03/23/2015 FINAL  Final  Body fluid culture     Status: None   Collection Time: 03/21/15  2:00 PM  Result Value Ref Range Status   Specimen Description FLUID SYNOVIAL RIGHT SHOULDER  Final   Special Requests NONE  Final   Gram  Stain   Final    ABUNDANT WBC PRESENT, PREDOMINANTLY PMN NO ORGANISMS SEEN    Culture   Final    FEW ENTEROCOCCUS SPECIES CRITICAL RESULT CALLED TO, READ BACK BY AND VERIFIED WITH: R HILL,RN AT 1324 03/24/15 BY L BENFIELD    Report Status 03/26/2015 FINAL  Final   Organism ID, Bacteria ENTEROCOCCUS SPECIES  Final      Susceptibility   Enterococcus species - MIC*    AMPICILLIN <=2 SENSITIVE Sensitive     VANCOMYCIN 1 SENSITIVE Sensitive     GENTAMICIN SYNERGY SENSITIVE Sensitive     * FEW ENTEROCOCCUS SPECIES  C difficile quick scan w PCR reflex     Status: None   Collection Time: 03/25/15  2:53 AM  Result Value Ref Range Status   C Diff antigen NEGATIVE NEGATIVE Final   C Diff toxin NEGATIVE NEGATIVE Final   C Diff interpretation Negative for toxigenic C. difficile  Final     Scheduled Meds: . allopurinol  200 mg Oral Daily  . amLODipine  5 mg Oral Daily  . ampicillin (OMNIPEN) IV  2 g Intravenous 3 times per day  . atorvastatin  40 mg Oral q1800  . calcitRIOL  0.25 mcg Oral QODAY  . calcitRIOL  0.5 mcg Oral QODAY  . carvedilol  25 mg Oral BID WC  . darbepoetin (ARANESP) injection - NON-DIALYSIS  100 mcg Subcutaneous Q Sat-1800  . famotidine  20 mg Oral Daily  . feeding supplement (NEPRO CARB STEADY)  237 mL Oral BID BM  . feeding supplement (PRO-STAT SUGAR FREE 64)  30 mL Oral Daily  . isosorbide mononitrate  90 mg Oral Daily  . latanoprost  1 drop Both Eyes QHS  . magnesium oxide  400 mg Oral BID  . mycophenolate  180 mg Oral BID  . predniSONE  5 mg Oral Q breakfast  . ranolazine  500 mg Oral BID  . sodium bicarbonate  1,300 mg Oral BID  . tacrolimus  2 mg Oral BID  . warfarin  1.25 mg Oral ONCE-1800  . Warfarin - Pharmacist Dosing Inpatient   Does not apply q1800   Continuous Infusions:   [facility] Pager [phone]  If 7PM-7AM, please contact night-coverage [email or web address] Password [email or web address] 03/26/2015, 12:36 PM   LOS: 12 days

## 2015-03-26 NOTE — NC FL2 (Signed)
[place] LEVEL OF CARE SCREENING TOOL     IDENTIFICATION  Patient Name: Debbie [name] Birthdate: Jul 26, 1950 Sex: female Admission Date (Current Location): 03/13/2015  [facility] and [place] Number:  Herbalist and Address:  [facility], [place] [address] [place], [place] [address]      Provider Number: [id number]  Attending Physician Name and Address:  Verlee Monte, MD  Relative Name and Phone Number:       Current Level of Care: Hospital Recommended Level of Care: [place] Prior Approval Number:    Date Approved/Denied:   PASRR Number: [id number]  Discharge Plan: Home    Current Diagnoses: Patient Active Problem List   Diagnosis Date Noted  . Effusion of shoulder joint   . Pyogenic arthritis of right shoulder region ([place])   . Arm pain, right 03/17/2015  . Enterococcal bacteremia   . Bacteremia   . Renal transplant recipient 03/14/2015  . Coagulopathy ([place]) 03/14/2015  . ARF (acute renal failure) ([place]) 03/14/2015  . Acute renal failure superimposed on stage 4 chronic kidney disease ([place]) 03/14/2015  . Cardiomyopathy, ischemic-40-45% 03/14/2015  . Supratherapeutic INR 03/14/2015  . LBBB (left bundle branch block) 03/14/2015  . Anemia of chronic kidney failure 03/14/2015  . Hypokalemia   . Hyperkalemia   . NSTEMI- Jan 2017- medical Rx   . Anemia in CKD (chronic kidney disease)   . Acute on chronic renal failure ([place]) 02/15/2015  . Acute systolic heart failure ([place]) 02/15/2015  . Absolute anemia   . Acute renal failure superimposed on stage 3 chronic kidney disease ([place]) 02/14/2015  . Dehydration 02/14/2015  . Chest pain 02/14/2015  . Pulmonary hypertension ([place]) 09/29/2013  . AKI (acute kidney injury) ([place]) 08/29/2013  . Dyslipidemia 07/22/2013  . Carotid bruit 07/22/2013  . Chronic anticoagulation - with Coumadin 07/06/2013  . Stable angina ([place]) 06/30/2013  . HTN (hypertension) 06/30/2013  .  Diarrhea 06/21/2011  . Neutropenic fever ([place]) 04/17/2011  . Hyponatremia 04/17/2011  . CKD (chronic kidney disease), stage IV ([place]) 04/17/2011  . Kidney transplant as cause of abnormal reaction or later complication     Orientation RESPIRATION BLADDER Height & Weight     Self, Time, Situation, Place  O2 (3L) Continent Weight: 147 lb 11.3 oz (67 kg) Height:  5\' 1"  (154.9 cm)  BEHAVIORAL SYMPTOMS/MOOD NEUROLOGICAL BOWEL NUTRITION STATUS      Incontinent Diet (Regular)  AMBULATORY STATUS COMMUNICATION OF NEEDS Skin   Extensive Assist Verbally Surgical wounds (Incision on sacrum)                       Personal Care Assistance Level of Assistance  Bathing, Feeding, Dressing Bathing Assistance: Maximum assistance Feeding assistance: Independent Dressing Assistance: Maximum assistance     Functional Limitations Info  Sight, Hearing, Speech Sight Info: Impaired Hearing Info: Adequate Speech Info: Adequate    SPECIAL CARE FACTORS FREQUENCY  PT (By licensed PT), OT (By licensed OT)                    Contractures Contractures Info: Not present    Additional Factors Info  Code Status, Allergies Code Status Info: Full Allergies Info: Feraheme, Vancomycin, Dorzolamide Hcl-timolol Mal, Gentamycin, Latanoprost, Cefazolin, Codeine, Hydrocodone-acetaminophen, Penicillins           Current Medications (03/26/2015):  This is the current hospital active medication list Current Facility-Administered Medications  Medication Dose Route Frequency Provider Last Rate Last Dose  .  acetaminophen (TYLENOL) tablet 650 mg  650 mg Oral Q6H PRN Rise Patience, MD   650 mg at 03/20/15 1650   Or  . acetaminophen (TYLENOL) suppository 650 mg  650 mg Rectal Q6H PRN Rise Patience, MD      . allopurinol (ZYLOPRIM) tablet 200 mg  200 mg Oral Daily Rise Patience, MD   200 mg at 03/26/15 0957  . amLODipine (NORVASC) tablet 5 mg  5 mg Oral Daily Brett Canales, PA-C   5 mg at  03/26/15 [id number]  . ampicillin (OMNIPEN) 2 g in sodium chloride 0.9 % 50 mL IVPB  2 g Intravenous 3 times per day Lauren D Bajbus, RPH   2 g at 03/26/15 0617  . atorvastatin (LIPITOR) tablet 40 mg  40 mg Oral q1800 Rise Patience, MD   40 mg at 03/25/15 1716  . calcitRIOL (ROCALTROL) capsule 0.25 mcg  0.25 mcg Oral QODAY Rise Patience, MD   0.25 mcg at 03/26/15 1000  . calcitRIOL (ROCALTROL) capsule 0.5 mcg  0.5 mcg Oral QODAY Rise Patience, MD   0.5 mcg at 03/25/15 1048  . carvedilol (COREG) tablet 25 mg  25 mg Oral BID WC Will Meredith Leeds, MD   25 mg at 03/26/15 0824  . Darbepoetin Alfa (ARANESP) injection 100 mcg  100 mcg Subcutaneous Q Sat-1800 Corliss Parish, MD   100 mcg at 03/19/15 1854  . famotidine (PEPCID) tablet 20 mg  20 mg Oral Daily Karren Cobble, RPH   20 mg at 03/26/15 1003  . feeding supplement (NEPRO CARB STEADY) liquid 237 mL  237 mL Oral BID BM Modena Jansky, MD   237 mL at 03/25/15 1322  . feeding supplement (PRO-STAT SUGAR FREE 64) liquid 30 mL  30 mL Oral Daily Modena Jansky, MD   30 mL at 03/26/15 1001  . fentaNYL (SUBLIMAZE) injection 12.5 mcg  12.5 mcg Intravenous Q2H PRN Orson Eva, MD      . isosorbide mononitrate (IMDUR) 24 hr tablet 90 mg  90 mg Oral Daily Carlena Bjornstad, MD   90 mg at 03/26/15 1004  . latanoprost (XALATAN) 0.005 % ophthalmic solution 1 drop  1 drop Both Eyes QHS Rise Patience, MD   1 drop at 03/25/15 2254  . magic mouthwash w/lidocaine  15 mL Oral QID PRN Rhetta Mura Schorr, NP   15 mL at 03/23/15 1330  . magnesium oxide (MAG-OX) tablet 400 mg  400 mg Oral BID Rise Patience, MD   400 mg at 03/26/15 1003  . mycophenolate (MYFORTIC) EC tablet 180 mg  180 mg Oral BID Rise Patience, MD   180 mg at 03/26/15 0958  . nitroGLYCERIN (NITROSTAT) SL tablet 0.4 mg  0.4 mg Sublingual Q5 min PRN Rise Patience, MD   0.4 mg at 03/26/15 0655  . ondansetron (ZOFRAN) tablet 4 mg  4 mg Oral Q6H PRN Rise Patience, MD       Or  . ondansetron [facility]) injection 4 mg  4 mg Intravenous Q6H PRN Rise Patience, MD      . oxyCODONE-acetaminophen (PERCOCET/ROXICET) 5-325 MG per tablet 1 tablet  1 tablet Oral Q6H PRN Rise Patience, MD   1 tablet at 03/24/15 1822  . predniSONE (DELTASONE) tablet 5 mg  5 mg Oral Q breakfast Rise Patience, MD   5 mg at 03/26/15 0824  . ranolazine (RANEXA) 12 hr tablet 500 mg  500 mg Oral BID  Brett Canales, PA-C   500 mg at 03/26/15 1000  . sodium bicarbonate tablet 1,300 mg  1,300 mg Oral BID Rise Patience, MD   1,300 mg at 03/26/15 1000  . sodium chloride flush (NS) 0.9 % injection 10-40 mL  10-40 mL Intracatheter PRN Verlee Monte, MD   10 mL at 03/25/15 1759  . sodium chloride flush (NS) 0.9 % injection 10-40 mL  10-40 mL Intracatheter PRN Verlee Monte, MD   10 mL at 03/26/15 0939  . tacrolimus (PROGRAF) capsule 2 mg  2 mg Oral BID Rise Patience, MD   2 mg at 03/26/15 1002  . warfarin (COUMADIN) tablet 1.25 mg  1.25 mg Oral ONCE-1800 Ricka Burdock, RPH      . Warfarin - Pharmacist Dosing Inpatient   Does not apply q1800 Lauren D Bajbus, [place]         Discharge Medications: Please see discharge summary for a list of discharge medications.  Relevant Imaging Results:  Relevant Lab Results:   Additional Information    Boone Master, LCSW Weekend Coverage

## 2015-03-27 LAB — CBC
HEMATOCRIT: 22.2 % — AB (ref 36.0–46.0)
HEMOGLOBIN: 6.9 g/dL — AB (ref 12.0–15.0)
MCH: 26.8 pg (ref 26.0–34.0)
MCHC: 31.1 g/dL (ref 30.0–36.0)
MCV: 86.4 fL (ref 78.0–100.0)
Platelets: 146 10*3/uL — ABNORMAL LOW (ref 150–400)
RBC: 2.57 MIL/uL — ABNORMAL LOW (ref 3.87–5.11)
RDW: 17.5 % — ABNORMAL HIGH (ref 11.5–15.5)
WBC: 5.7 10*3/uL (ref 4.0–10.5)

## 2015-03-27 LAB — BASIC METABOLIC PANEL
Anion gap: 10 (ref 5–15)
BUN: 49 mg/dL — ABNORMAL HIGH (ref 6–20)
CHLORIDE: 100 mmol/L — AB (ref 101–111)
CO2: 28 mmol/L (ref 22–32)
Calcium: 9.8 mg/dL (ref 8.9–10.3)
Creatinine, Ser: 3 mg/dL — ABNORMAL HIGH (ref 0.44–1.00)
GFR, EST AFRICAN AMERICAN: 18 mL/min — AB (ref >60)
GFR, EST NON AFRICAN AMERICAN: 15 mL/min — AB (ref >60)
Glucose, Bld: 116 mg/dL — ABNORMAL HIGH (ref 65–99)
POTASSIUM: 4.5 mmol/L (ref 3.5–5.1)
Sodium: 138 mmol/L (ref 135–145)

## 2015-03-27 LAB — PREPARE RBC (CROSSMATCH)

## 2015-03-27 LAB — PROTIME-INR
INR: 1.43 (ref 0.00–1.49)
Prothrombin Time: 17.5 seconds — ABNORMAL HIGH (ref 11.6–15.2)

## 2015-03-27 MED ORDER — SODIUM CHLORIDE 0.9 % IV SOLN: Freq: Once | INTRAVENOUS | Status: DC

## 2015-03-27 MED ORDER — WARFARIN SODIUM 2 MG PO TABS
2.0000 mg | ORAL_TABLET | Freq: Once | ORAL | Status: AC
  Administered 2015-03-27: 2 mg via ORAL
  Filled 2015-03-27: qty 1

## 2015-03-27 MED ORDER — FUROSEMIDE 40 MG PO TABS
40.0000 mg | ORAL_TABLET | Freq: Every day | ORAL | Status: DC
  Administered 2015-03-27 – 2015-03-28 (×2): 40 mg via ORAL
  Filled 2015-03-27 (×2): qty 1

## 2015-03-27 NOTE — Progress Notes (Addendum)
PROGRESS NOTE  Debbie [name] [id number] DOB: 10/28/1950 DOA: 03/13/2015 PCP: No PCP Per Patient   Subjective: No clinical changes since yesterday, spoke with both daughters yesterday, she agreeable to go to SNF in a.m. Hemoglobin is 6.9, transfuse 1 unit of packed RBCs. 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 Jacques Navy appointment 02/16/15) presented to [facility] with 1 day history of dizziness and generalized weakness. The patient was recently discharged from the hospital on 02/21/2015 after treatment for NSTEMI and acute on chronic renal failure. The patient states that she has had poor by mouth intake for the past 2-3 days with constipation. Upon presentation, she was noted to have a serum creatinine of 4.31. The patient received 2 L bolus of fluid in the emergency department. She was also found to have INR of 8.14. On the afternoon of 03/14/2015, the patient developed substernal chest pain for which she received nitroglycerin with relief. Cardiology was consulted. EKG showed a new LBBB.  Assessment/Plan:  Acute on chronic renal failure (CKD 4) in pt with renal transplant -Due to volume depletion and infection -Creatinine baseline 2.2 on 1/30. Admitted with creatinine of 4.31 and is gradually improving (3.41 on 2/23). -Patient endorses compliance with all her medications including  immunosuppressive therapy -Renal ultrasound: Native kidneys not identified possibly due to atrophy. Transplanted kidney without hydronephrosis. -Follow renal function closely, currently around baseline at 2.7.  Right shoulder septic arthritis -Enterococcus acute septic arthritis of the right shoulder, shoulder aspirate growing enterococcus -Dr. Sharol Given recommended surgery with I&D and antibiotic wash, patient declined any surgery. -Patient wants only antibiotics, I explained to her the infection might not go away completely with only antibiotics.  -Per ID 1 more month of ampicillin  Enterococcus bacteremia -Source unclear. 2/20: 1/2 blood cultures positive for enterococcus. 2nd blood culture positive for enterococcus and Coag neg staphylococcus. 2/22: Blood culture 2: Also positive - Infectious disease follow-up appreciated. ID recommends TEE - patient had been contemplating and reluctant all along but has finally consented to having it on 2/26. - Cubicin changed to IV ampicillin & Rocephin. Blood culture from 2/24 still NGTD.  Mild hyperkalemia -Resolved after a dose of Kayexalate.  Chest pain and new LBBB on EKG -Concerning for angina with new left bundle branch block -relieved after SL NTG x 1 -EKG--new LBBB -pt had NSTEMI last admission in Jan 2017 -02/15/2015 echo EF 40-45%, PAP 60 -continue ASA -Continue imdur/Ranexa -Defer Ranexa pharmacy concern to cardiology -Elevated troponins likely due to renal failure -Cardiology follow-up appreciated: Suspect anemia contribute it to her angina. Continue carvedilol, Lipitor, Imdur and Ranexa. Cardiology does not recommend any further workup and had signed off 2/22. However due to recurrent NTG responsive chest pain, cardiology has seen again on 2/23. EKG without acute changes and no LBBB at this time (LBBB is intermittent).  -Cardiology has increased dose of Imdur and beta blocker. Ranexa held secondary to renal insufficiency-consider resuming  as renal function improves.  Right upper extremity pain and swelling -CT  scan of the shoulder showed effusion, arthrocentesis showed septic arthritis. -The swelling secondary to septic arthritis and the innominate vein stenosis.  Hyponatremia -Secondary to volume depletion -Sodium normal today.  Paroxysmal atrial fibrillation -Presently in sinus rhythm -Rate controlled -Continue warfarin. INR therapeutic.  Warfarin-induced coagulopathy -Patient has supratherapeutic INR--8.43 on admission. Decreased. -As the patient is not actively bleeding and hemodynamically stable, allow INR to drift down -vitamin K if bleed or INR continues to rise  Hypertension -Continue carvedilol and Imdur. Dose increased by cardiology  SLE/positive anticardiolipin antibodies/history of DVT/PE:  -On chronic Coumadin at home. INR as above.. Status post renal transplant with chronic allograft nephropathy: Continue immunosuppressants  Anemia of CKD -Baseline hemoglobin 9-10 -Transfuse 1 unit of packed RBCs, hemoglobin is 6.9, total transfusion of 2 units since admission.  Right fibular and medial malleolus fracture -As per cardiology, patient would be high risk for surgery with a recent NSTEMI -Continue CAM boot. -Outpt follow up with Dr. Ninfa Linden   DVT prophylaxis: Anticoagulated on Coumadin. CODE STATUS: Full Family Communication: None at bedside Disposition Plan: DC home when medically stable.    Procedures/Studies: Ct Head Wo Contrast  03/14/2015  CLINICAL DATA:  Acute onset of dizziness.  Initial encounter. EXAM: CT HEAD WITHOUT CONTRAST TECHNIQUE: Contiguous axial images were obtained from the base of the skull through the vertex without intravenous contrast. COMPARISON:  CT of the head performed 02/14/2015 FINDINGS: There is no evidence of acute infarction, mass lesion, or intra- or extra-axial hemorrhage on CT. Prominence of the ventricles sulci reflects mild cortical volume loss. The  brainstem and fourth ventricle are within normal limits. The basal ganglia are unremarkable in appearance. The cerebral hemispheres demonstrate grossly normal gray-white differentiation. No mass effect or midline shift is seen. There is no evidence of fracture; visualized osseous structures are unremarkable in appearance. The visualized portions of the orbits are within normal limits. The paranasal sinuses and mastoid air cells are well-aerated. No significant soft tissue abnormalities are seen. IMPRESSION: 1. No acute intracranial pathology seen on CT. 2. Mild cortical volume loss noted. Electronically Signed   By: Garald Balding M.D.   On: 03/14/2015 00:35   Ct Humerus Right Wo Contrast  03/18/2015  CLINICAL DATA:  [place] 3-4 days ago.  Injured right upper extremity. EXAM: CT OF THE RIGHT SHOULDER WITHOUT CONTRAST; CT OF THE RIGHT HUMERUS WITHOUT CONTRAST; CT OF THE RIGHT FOREARM WITHOUT CONTRAST TECHNIQUE: Multidetector CT imaging was performed according to the standard protocol. Multiplanar CT image reconstructions were also generated. COMPARISON:  None. FINDINGS: No acute fractures are identified. The shoulder joint is maintained. No fracture or dislocation. Mild AC joint degenerative changes. No AC joint separation. No humeral fracture. There is extensive subacromial/subdeltoid and subscapularis bursitis. There is also a large glenohumeral joint effusion and severe synovitis. Dialysis grafts are noted in the right upper extremity. The elbow joint is maintained. No elbow fracture or obvious joint effusion. Headache cardiac No forearm fractures are identified. There is a moderate-sized right pleural effusion with overlying atelectasis. No obvious acute right rib fractures are identified. The thoracic vertebral bodies are intact. Moderate degenerative changes noted at the sternoclavicular joints. Small scarred right kidney is noted. There is advanced atherosclerotic calcifications involving the aorta and branch  vessels. IMPRESSION: No acute fractures of the right upper extremity are identified. The joints are maintained. Extensive synovitis, joint effusion and bursitis involving the right shoulder. Electronically Signed   By: Marijo Sanes M.D.   On: 03/18/2015 09:11   Ct Shoulder Right  Wo Contrast  03/18/2015  CLINICAL DATA:  [place] 3-4 days ago.  Injured right upper extremity. EXAM: CT OF THE RIGHT SHOULDER WITHOUT CONTRAST; CT OF THE RIGHT HUMERUS WITHOUT CONTRAST; CT OF THE RIGHT FOREARM WITHOUT CONTRAST TECHNIQUE: Multidetector CT imaging was performed according to the standard protocol. Multiplanar CT image reconstructions were also generated. COMPARISON:  None. FINDINGS: No acute fractures are identified. The shoulder joint is maintained. No fracture or dislocation. Mild AC joint degenerative changes. No AC joint separation. No humeral fracture. There is extensive subacromial/subdeltoid and subscapularis bursitis. There is also a large glenohumeral joint effusion and severe synovitis. Dialysis grafts are noted in the right upper extremity. The elbow joint is maintained. No elbow fracture or obvious joint effusion. Headache cardiac No forearm fractures are identified. There is a moderate-sized right pleural effusion with overlying atelectasis. No obvious acute right rib fractures are identified. The thoracic vertebral bodies are intact. Moderate degenerative changes noted at the sternoclavicular joints. Small scarred right kidney is noted. There is advanced atherosclerotic calcifications involving the aorta and branch vessels. IMPRESSION: No acute fractures of the right upper extremity are identified. The joints are maintained. Extensive synovitis, joint effusion and bursitis involving the right shoulder. Electronically Signed   By: Marijo Sanes M.D.   On: 03/18/2015 09:11   Ct Forearm Right Wo Contrast  03/18/2015  CLINICAL DATA:  [place] 3-4 days ago.  Injured right upper extremity. EXAM: CT OF THE RIGHT SHOULDER  WITHOUT CONTRAST; CT OF THE RIGHT HUMERUS WITHOUT CONTRAST; CT OF THE RIGHT FOREARM WITHOUT CONTRAST TECHNIQUE: Multidetector CT imaging was performed according to the standard protocol. Multiplanar CT image reconstructions were also generated. COMPARISON:  None. FINDINGS: No acute fractures are identified. The shoulder joint is maintained. No fracture or dislocation. Mild AC joint degenerative changes. No AC joint separation. No humeral fracture. There is extensive subacromial/subdeltoid and subscapularis bursitis. There is also a large glenohumeral joint effusion and severe synovitis. Dialysis grafts are noted in the right upper extremity. The elbow joint is maintained. No elbow fracture or obvious joint effusion. Headache cardiac No forearm fractures are identified. There is a moderate-sized right pleural effusion with overlying atelectasis. No obvious acute right rib fractures are identified. The thoracic vertebral bodies are intact. Moderate degenerative changes noted at the sternoclavicular joints. Small scarred right kidney is noted. There is advanced atherosclerotic calcifications involving the aorta and branch vessels. IMPRESSION: No acute fractures of the right upper extremity are identified. The joints are maintained. Extensive synovitis, joint effusion and bursitis involving the right shoulder. Electronically Signed   By: Marijo Sanes M.D.   On: 03/18/2015 09:11   US Renal  03/15/2015  CLINICAL DATA:  Acute renal failure superimposed on stage 4 chronic kidney disease. EXAM: RENAL / URINARY TRACT ULTRASOUND COMPLETE COMPARISON:  Included portions from chest CT 02/18/2015 FINDINGS: Right Kidney: Not visualized. Left Kidney: Not visualized. Transplant kidney located in the pelvis, side not specified. There is no transplant hydronephrosis. Blood flow seen. Transplant kidney measures 10 cm. Transplant cortical thickness appears maintained. No focal lesion. Detailed transplant Doppler evaluation not  performed. Bladder: Appears normal for degree of bladder distention. IMPRESSION: 1. Native kidneys not identified, likely secondary renal atrophy. 2. Pelvic transplant kidney without hydronephrosis. Electronically Signed   By: Jeb Levering M.D.   On: 03/15/2015 23:08   Dg Chest Port 1 View  03/26/2015  CLINICAL DATA:  Sepsis.  Pneumonia. EXAM: PORTABLE CHEST 1 VIEW COMPARISON:  March 25, 2015 FINDINGS: No pneumothorax. Diffuse left lung infiltrate  is again identified, obscuring the left heart border, similar in the interval given difference in technique. No other changes. Stable left IJ. IMPRESSION: Stable diffuse left lung infiltrate.  No other changes. Electronically Signed   By: Dorise Bullion III M.D   On: 03/26/2015 09:26   Dg Chest Port 1 View  03/25/2015  CLINICAL DATA:  Central line placement. EXAM: PORTABLE CHEST 1 VIEW COMPARISON:  03/25/2015. FINDINGS: Left IJ central line has been slightly retracted, its tip is at the level of the superior vena cava. Heart size stable. Persistent left lung diffuse infiltrate. Small left pleural effusion. No pneumothorax. IMPRESSION: 1. Left IJ line has been partially retracted, its tip is at the level of the superior vena cava. No pneumothorax. 2.  Diffuse left lung infiltrate again noted. Electronically Signed   By: Marcello Moores  Register   On: 03/25/2015 17:01   Dg Chest Port 1 View  03/25/2015  CLINICAL DATA:  Central line placement EXAM: PORTABLE CHEST 1 VIEW COMPARISON:  03/14/2015 FINDINGS: Left internal jugular central line has been placed and crosses midline to the right side with tip over the anticipated position of the brachiocephalic vein. No pneumothorax. Increased opacity throughout the lingula IMPRESSION: Central line projects with tip over the brachiocephalic vein on the right and should be retracted about 5 cm and re- advanced with repeat imaging. Lingular opacity Critical Value/emergent results were called by telephone at the time of interpretation  on 03/25/2015 at 4:15 pm to Sybil, the patient's nurse, who verbally acknowledged these results. Electronically Signed   By: Skipper Cliche M.D.   On: 03/25/2015 16:17   Dg Chest Portable 1 View  03/14/2015  CLINICAL DATA:  Fever and weakness tonight. EXAM: PORTABLE CHEST 1 VIEW COMPARISON:  Radiograph 02/18/2015.  Chest CT 02/16/2015 FINDINGS: Cardiomediastinal contours are unchanged allowing for patient rotation. Small left pleural effusion, diminished from prior. No evidence of right pleural effusion. Left basilar scarring. No pulmonary edema. No confluent airspace disease. No pneumothorax. IMPRESSION: Persistent but decreased left pleural effusion. Exam is otherwise unchanged. Electronically Signed   By: Jeb Levering M.D.   On: 03/14/2015 02:09   Dg Fluoro Guided Needle Plc Aspiration/injection Loc  03/21/2015  CLINICAL DATA:  Sinusitis, joint effusion and bursitis involving the right shoulder on CT 03/17/2015. EXAM: RIGHT SHOULDER ASPIRATION UNDER FLUOROSCOPY TECHNIQUE: Despite an INR of 3.99, Dr. Meridee Score insisted on a relatively emergent right shoulder aspiration due to the possibility of sepsis. The increased risk of bleeding was discussed with the patient prior to the procedure, as well as the risk of infection. Patient elected to proceed with the procedure. Informed consent was obtained. An appropriate skin entrance site was determined. The site was marked, prepped with Betadine, draped in the usual sterile fashion, and infiltrated locally with buffered Lidocaine. Based on CT imaging, 80 20 gauge spinal needle was inserted over the mid humeral head and 5-60 cc of serosanguineous fluid returned. No immediate complication. FLUOROSCOPY TIME:  Radiation Exposure Index (as provided by the fluoroscopic device): If the device does not provide the exposure index: Fluoroscopy Time (in minutes and seconds):  0 minutes 24 seconds. Number of Acquired Images:  None. FINDINGS: Despite an INR of 3.99, Dr.  Meridee Score insisted on a relatively emergent right shoulder aspiration due to the possibility of sepsis. The increased risk of bleeding was discussed with the patient prior to the procedure, as well as the risk of infection. Patient elected to proceed with the procedure. Informed consent was obtained. An appropriate  skin entrance site was determined. The site was marked, prepped with Betadine, draped in the usual sterile fashion, and infiltrated locally with buffered Lidocaine. Based on CT imaging, a 20 gauge spinal needle was inserted over the mid humeral head with return of 5-6 cc of serosanguineous fluid. No immediate complication. IMPRESSION: Technically successful right shoulder aspiration. Electronically Signed   By: Lorin Picket M.D.   On: 03/21/2015 15:16        Objective: Filed Vitals:   03/26/15 2040 03/27/15 0500 03/27/15 0518 03/27/15 0840  BP: 126/60  122/49 123/49  Pulse: 80  76 73  Temp: 98.7 F (37.1 C)  98.3 F (36.8 C) 97.2 F (36.2 C)  TempSrc: Oral  Oral Oral  Resp: 19  18 18   Height:      Weight: 66.5 kg (146 lb 9.7 oz) 74.118 kg (163 lb 6.4 oz)    SpO2: 95%  100% 94%    Intake/Output Summary (Last 24 hours) at 03/27/15 1231 Last data filed at 03/27/15 0841  Gross per 24 hour  Intake    480 ml  Output    200 ml  Net    280 ml   Weight change:  Exam:   General:  Pt is alert, follows commands appropriately, not in acute distress.   HEENT: No icterus, No thrush, No neck mass, [place]/AT  Cardiovascular: RRR, S1/S2, no rubs, no gallops. Telemetry: Sinus rhythm.  Respiratory: CTA bilaterally, no wheezing, no crackles, no rhonchi  Abdomen: Soft/+BS, non tender, non distended, no guarding  Extremities: 1+RLE edema, No lymphangitis, No petechiae, No rashes, no synovitis. Right leg in cam boot. Right upper extremity swelling improved some. No other acute findings.  Data Reviewed: Basic Metabolic Panel:  Recent Labs Lab 03/21/15 0527 03/22/15 1416  03/25/15 1034 03/26/15 0940 03/27/15 0500  NA 137 139 138 139 138  K 4.8 5.0 4.6 4.5 4.5  CL 106 103 101 101 100*  CO2 21* 23 25 28 28   GLUCOSE 135* 146* 138* 131* 116*  BUN 53* 46* 44* 50* 49*  CREATININE 2.25* 2.34* 2.73* 2.81* 3.00*  CALCIUM 9.1 9.8 10.0 9.9 9.8  PHOS 2.5 2.4*  --   --   --    Liver Function Tests:  Recent Labs Lab 03/21/15 0527 03/22/15 1416  ALBUMIN 2.0* 2.1*   No results for input(s): LIPASE, AMYLASE in the last 168 hours. No results for input(s): AMMONIA in the last 168 hours. CBC:  Recent Labs Lab 03/22/15 1641 03/23/15 0621 03/24/15 0554 03/25/15 0614 03/26/15 0940 03/27/15 0500  WBC 7.8 5.7 7.2 6.8 7.0 5.7  NEUTROABS 5.9  --   --   --   --   --   HGB 8.8* 9.9* 9.2* 7.9* 7.7* 6.9*  HCT 28.0* 32.1* 27.6* 23.8* 24.8* 22.2*  MCV 84.6 84.3 85.4 85.3 85.8 86.4  PLT 136* 69* 150 141* 157 146*   Cardiac Enzymes: No results for input(s): CKTOTAL, CKMB, CKMBINDEX, TROPONINI in the last 168 hours. BNP: Invalid input(s): POCBNP CBG:  Recent Labs Lab 03/26/15 0745 03/26/15 1201  GLUCAP 118* 126*    Recent Results (from the past 240 hour(s))  Culture, blood (Routine X 2) w Reflex to ID Panel     Status: None   Collection Time: 03/18/15  8:54 AM  Result Value Ref Range Status   Specimen Description BLOOD BLOOD LEFT HAND  Final   Special Requests IN PEDIATRIC BOTTLE 2.5CC  Final   Culture NO GROWTH 5 DAYS  Final  Report Status 03/23/2015 FINAL  Final  Culture, blood (Routine X 2) w Reflex to ID Panel     Status: None   Collection Time: 03/18/15  9:01 AM  Result Value Ref Range Status   Specimen Description BLOOD BLOOD LEFT HAND  Final   Special Requests IN PEDIATRIC BOTTLE 1CC  Final   Culture NO GROWTH 5 DAYS  Final   Report Status 03/23/2015 FINAL  Final  Body fluid culture     Status: None   Collection Time: 03/21/15  2:00 PM  Result Value Ref Range Status   Specimen Description FLUID SYNOVIAL RIGHT SHOULDER  Final   Special  Requests NONE  Final   Gram Stain   Final    ABUNDANT WBC PRESENT, PREDOMINANTLY PMN NO ORGANISMS SEEN    Culture   Final    FEW ENTEROCOCCUS SPECIES CRITICAL RESULT CALLED TO, READ BACK BY AND VERIFIED WITH: R HILL,RN AT 1324 03/24/15 BY L BENFIELD    Report Status 03/26/2015 FINAL  Final   Organism ID, Bacteria ENTEROCOCCUS SPECIES  Final      Susceptibility   Enterococcus species - MIC*    AMPICILLIN <=2 SENSITIVE Sensitive     VANCOMYCIN 1 SENSITIVE Sensitive     GENTAMICIN SYNERGY SENSITIVE Sensitive     * FEW ENTEROCOCCUS SPECIES  C difficile quick scan w PCR reflex     Status: None   Collection Time: 03/25/15  2:53 AM  Result Value Ref Range Status   C Diff antigen NEGATIVE NEGATIVE Final   C Diff toxin NEGATIVE NEGATIVE Final   C Diff interpretation Negative for toxigenic C. difficile  Final     Scheduled Meds: . sodium chloride   Intravenous Once  . allopurinol  200 mg Oral Daily  . amLODipine  5 mg Oral Daily  . ampicillin (OMNIPEN) IV  2 g Intravenous 3 times per day  . atorvastatin  40 mg Oral q1800  . calcitRIOL  0.25 mcg Oral QODAY  . calcitRIOL  0.5 mcg Oral QODAY  . carvedilol  25 mg Oral BID WC  . darbepoetin (ARANESP) injection - NON-DIALYSIS  100 mcg Subcutaneous Q Sat-1800  . famotidine  20 mg Oral Daily  . feeding supplement (NEPRO CARB STEADY)  237 mL Oral BID BM  . feeding supplement (PRO-STAT SUGAR FREE 64)  30 mL Oral Daily  . isosorbide mononitrate  90 mg Oral Daily  . latanoprost  1 drop Both Eyes QHS  . magnesium oxide  400 mg Oral BID  . mycophenolate  180 mg Oral BID  . predniSONE  5 mg Oral Q breakfast  . ranolazine  500 mg Oral BID  . sodium bicarbonate  1,300 mg Oral BID  . tacrolimus  2 mg Oral BID  . warfarin  2 mg Oral ONCE-1800  . Warfarin - Pharmacist Dosing Inpatient   Does not apply q1800   Continuous Infusions:   [facility] Pager [phone]  If 7PM-7AM, please contact  night-coverage [email or web address] Password [email or web address] 03/27/2015, 12:31 PM   LOS: 13 days

## 2015-03-27 NOTE — Progress Notes (Signed)
ANTICOAGULATION CONSULT NOTE - Follow Up Consult  Pharmacy Consult for warfarin Indication: atrial fibrillation  Allergies  Allergen Reactions  . Feraheme [Ferumoxytol] Other (See Comments)    Chest pain, pulsating 02/17/15: tolerated Nulecit  . Vancomycin Anaphylaxis, Itching, Swelling and Other (See Comments)    Tongue swell  . Dorzolamide Hcl-Timolol Mal Rash and Other (See Comments)    Eye pain  . Gentamycin [Gentamicin Sulfate] Itching and Swelling  . Latanoprost Rash and Other (See Comments)    Eye pain  . Cefazolin Itching  . Codeine Itching  . Hydrocodone-Acetaminophen Itching  . Penicillins Itching    *tolerated ampicillin during 02/2015 admission Has patient had a PCN reaction causing immediate rash, facial/tongue/throat swelling, SOB or lightheadedness with hypotension:No Has patient had a PCN reaction causing severe rash involving mucus membranes or skin necrosis:No Has patient had a PCN reaction that required hospitalization:No Has patient had a PCN reaction occurring within the last 10 years:No If all of the above answers are "NO", then may proceed with Cephalosporin use.     Patient Measurements: Height: 5\' 1"  (154.9 cm) Weight: 163 lb 6.4 oz (74.118 kg) IBW/kg (Calculated) : 47.8  Vital Signs: Temp: 98.3 F (36.8 C) (03/05 0518) Temp Source: Oral (03/05 0518) BP: 122/49 mmHg (03/05 0518) Pulse Rate: 76 (03/05 0518)  Labs:  Recent Labs  03/25/15 0614 03/25/15 1034 03/26/15 0940 03/27/15 0500  HGB 7.9*  --  7.7* 6.9*  HCT 23.8*  --  24.8* 22.2*  PLT 141*  --  157 146*  LABPROT 18.5*  --  17.8* 17.5*  INR 1.54*  --  1.46 1.43  CREATININE  --  2.73* 2.81* 3.00*    Estimated Creatinine Clearance: 17.2 mL/min (by C-G formula based on Cr of 3).   Medications:  Prescriptions prior to admission  Medication Sig Dispense Refill Last Dose  . acetaminophen (TYLENOL) 325 MG tablet Take 2 tablets (650 mg total) by mouth every 6 (six) hours as needed for  mild pain (or Fever >/= 101).   Past Month at Unknown time  . allopurinol (ZYLOPRIM) 100 MG tablet Take 2 tablets (200 mg total) by mouth daily.   03/13/2015 at Unknown time  . atorvastatin (LIPITOR) 40 MG tablet Take 1 tablet (40 mg total) by mouth daily at 6 PM. 30 tablet 0 03/12/2015 at Unknown time  . bimatoprost (LUMIGAN) 0.01 % SOLN Place 1 drop into both eyes at bedtime.    03/12/2015 at Unknown time  . Brinzolamide-Brimonidine (SIMBRINZA) 1-0.2 % SUSP Place 1 drop into the left eye 3 (three) times daily.   03/13/2015 at Unknown time  . calcitRIOL (ROCALTROL) 0.25 MCG capsule Take 0.25-0.5 mcg by mouth every other day. alternating dosages   03/13/2015 at Unknown time  . carvedilol (COREG) 6.25 MG tablet Take 1 tablet (6.25 mg total) by mouth 2 (two) times daily with a meal. 60 tablet 0 03/13/2015 at 1300  . furosemide (LASIX) 40 MG tablet Take 1 tablet (40 mg total) by mouth daily. 30 tablet 0 03/13/2015 at Unknown time  . isosorbide mononitrate (IMDUR) 60 MG 24 hr tablet Take 1 tablet (60 mg total) by mouth daily. 30 tablet 0 03/13/2015 at Unknown time  . MAGNESIUM-OXIDE 400 (241.3 MG) MG tablet TAKE 1 TABLET BY MOUTH TWICE DAILY 60 tablet 3 03/13/2015 at AM  . mycophenolate (MYFORTIC) 180 MG EC tablet Take 180 mg by mouth 2 (two) times daily.    03/13/2015 at AM  . nitroGLYCERIN (NITROSTAT) 0.4 MG SL tablet Place  0.4 mg under the tongue every 5 (five) minutes as needed for chest pain.    not used  . oxyCODONE-acetaminophen (PERCOCET/ROXICET) 5-325 MG tablet Take 1 tablet by mouth every 6 (six) hours as needed for moderate pain or severe pain. (Patient taking differently: Take 0.5-1 tablets by mouth every 6 (six) hours as needed for moderate pain or severe pain. ) 20 tablet 0 03/12/2015 at Unknown time  . predniSONE (DELTASONE) 5 MG tablet Take 5 mg by mouth daily with breakfast.    03/13/2015 at Unknown time  . RANEXA 500 MG 12 hr tablet TAKE 1 TABLET BY MOUTH TWICE DAILY 180 tablet 1 03/13/2015 at AM  .  ranitidine (ZANTAC) 150 MG tablet Take 150 mg by mouth daily.    03/13/2015 at Unknown time  . sodium bicarbonate 650 MG tablet Take 1,300 mg by mouth 2 (two) times daily.    03/13/2015 at Unknown time  . tacrolimus (PROGRAF) 1 MG capsule Take 2 mg by mouth 2 (two) times daily.    03/13/2015 at Unknown time  . warfarin (COUMADIN) 2.5 MG tablet Take 0.5-1 tablets (1.25-2.5 mg total) by mouth daily at 6 PM. Monday and Friday 2.5 mg and 1.25 mg on all other days 30 tablet 0 03/12/2015 at Unknown time  . labetalol (NORMODYNE) 100 MG tablet Take 100 mg by mouth 2 (two) times daily. Reported on 03/13/2015   Completed Course at 03/13/15  . methocarbamol (ROBAXIN) 500 MG tablet Take 1 tablet (500 mg total) by mouth 2 (two) times daily. (Patient not taking: Reported on 03/03/2015) 20 tablet 0 Not Taking  . timolol (TIMOPTIC) 0.5 % ophthalmic solution Place 1 drop into both eyes 2 (two) times daily. Reported on 03/13/2015   Not Taking at Unknown time    Assessment: 65 yo F admitted 03/13/2015 on warfarin PTA. Warfarin was held and patient received Vit K during this admission in preparation for I&D however patient refused procedure. Warfarin resumed 3/3.  INR 1.43 (subtherapeutic), Hgb 6.9, Plt wnl. No s/sx of bleeding noted.  Goal of Therapy:  INR 2-3 Monitor platelets by anticoagulation protocol: Yes   Plan:  - Give warfarin 2 mg x1 tonight - Monitor daily INR, CBC and s/sx of bleeding  Dimitri Ped, PharmD. PGY-1 Pharmacy Resident Pager: [phone] 03/27/2015,7:21 AM

## 2015-03-27 NOTE — Clinical Social Work Placement (Signed)
   CLINICAL SOCIAL WORK PLACEMENT  NOTE  Date:  03/27/2015  Patient Details  Name: Debbie [name] MRN: [id number] Date of Birth: October 26, 1950  Clinical Social Work is seeking post-discharge placement for this patient at the [place] level of care (*CSW will initial, date and re-position this form in  chart as items are completed):      Patient/family provided with [place] Work Department's list of facilities offering this level of care within the geographic area requested by the patient (or if unable, by the patient's family).      Patient/family informed of their freedom to choose among providers that offer the needed level of care, that participate in Medicare, Medicaid or managed care program needed by the patient, have an available bed and are willing to accept the patient.      Patient/family informed of 's ownership interest in [facility] and [facility], as well as of the fact that they are under no obligation to receive care at these facilities.  PASRR submitted to EDS on 03/26/15     PASRR number received on 03/26/15     Existing PASRR number confirmed on       FL2 transmitted to all facilities in geographic area requested by pt/family on 03/26/15     FL2 transmitted to all facilities within larger geographic area on       Patient informed that his/her managed care company has contracts with or will negotiate with certain facilities, including the following:            Patient/family informed of bed offers received.  Patient chooses bed at       Physician recommends and patient chooses bed at      Patient to be transferred to   on  .  Patient to be transferred to facility by       Patient family notified on   of transfer.  Name of family member notified:        PHYSICIAN Please prepare priority discharge summary, including medications, Please prepare prescriptions, Please sign FL2     Additional Comment:     _______________________________________________ Ludwig Clarks, LCSW 03/27/2015, 3:14 PM

## 2015-03-28 LAB — BASIC METABOLIC PANEL
ANION GAP: 10 (ref 5–15)
BUN: 46 mg/dL — ABNORMAL HIGH (ref 6–20)
CO2: 28 mmol/L (ref 22–32)
Calcium: 9.6 mg/dL (ref 8.9–10.3)
Chloride: 102 mmol/L (ref 101–111)
Creatinine, Ser: 2.97 mg/dL — ABNORMAL HIGH (ref 0.44–1.00)
GFR calc Af Amer: 18 mL/min — ABNORMAL LOW (ref >60)
GFR calc non Af Amer: 16 mL/min — ABNORMAL LOW (ref >60)
GLUCOSE: 125 mg/dL — AB (ref 65–99)
POTASSIUM: 4.3 mmol/L (ref 3.5–5.1)
Sodium: 140 mmol/L (ref 135–145)

## 2015-03-28 LAB — CBC
HEMATOCRIT: 27.2 % — AB (ref 36.0–46.0)
HEMOGLOBIN: 8.7 g/dL — AB (ref 12.0–15.0)
MCH: 27.3 pg (ref 26.0–34.0)
MCHC: 32 g/dL (ref 30.0–36.0)
MCV: 85.3 fL (ref 78.0–100.0)
Platelets: 175 10*3/uL (ref 150–400)
RBC: 3.19 MIL/uL — AB (ref 3.87–5.11)
RDW: 17.2 % — ABNORMAL HIGH (ref 11.5–15.5)
WBC: 7 10*3/uL (ref 4.0–10.5)

## 2015-03-28 LAB — TYPE AND SCREEN
ABO/RH(D): B POS
Antibody Screen: NEGATIVE
Unit division: 0

## 2015-03-28 LAB — PROTIME-INR
INR: 1.68 — ABNORMAL HIGH (ref 0.00–1.49)
Prothrombin Time: 19.8 seconds — ABNORMAL HIGH (ref 11.6–15.2)

## 2015-03-28 MED ORDER — WARFARIN SODIUM 2 MG PO TABS
2.0000 mg | ORAL_TABLET | Freq: Once | ORAL | Status: AC
  Administered 2015-03-28: 2 mg via ORAL
  Filled 2015-03-28: qty 1

## 2015-03-28 MED ORDER — HEPARIN SOD (PORK) LOCK FLUSH 10 UNIT/ML IV SOLN
10.0000 [IU] | INTRAVENOUS | Status: AC | PRN
  Administered 2015-03-28: 10 [IU]

## 2015-03-28 MED ORDER — SODIUM CHLORIDE 0.9 % IV SOLN: 2.0000 g | Freq: Three times a day (TID) | INTRAVENOUS | Status: DC

## 2015-03-28 MED ORDER — OXYCODONE-ACETAMINOPHEN 5-325 MG PO TABS: 0.5000 | ORAL_TABLET | Freq: Four times a day (QID) | ORAL | Status: DC | PRN

## 2015-03-28 NOTE — Care Management Important Message (Signed)
Important Message  Patient Details  Name: Debbie [name] MRN: [id number] Date of Birth: 08/26/1950   Medicare Important Message Given:  Yes    Coy Rochford P Laylonie Marzec 03/28/2015, 3:17 PM

## 2015-03-28 NOTE — Progress Notes (Signed)
ANTICOAGULATION CONSULT NOTE - Follow Up Consult  Pharmacy Consult for warfarin Indication: atrial fibrillation  Allergies  Allergen Reactions  . Feraheme [Ferumoxytol] Other (See Comments)    Chest pain, pulsating 02/17/15: tolerated Nulecit  . Vancomycin Anaphylaxis, Itching, Swelling and Other (See Comments)    Tongue swell  . Dorzolamide Hcl-Timolol Mal Rash and Other (See Comments)    Eye pain  . Gentamycin [Gentamicin Sulfate] Itching and Swelling  . Latanoprost Rash and Other (See Comments)    Eye pain  . Cefazolin Itching  . Codeine Itching  . Hydrocodone-Acetaminophen Itching  . Penicillins Itching    *tolerated ampicillin during 02/2015 admission Has patient had a PCN reaction causing immediate rash, facial/tongue/throat swelling, SOB or lightheadedness with hypotension:No Has patient had a PCN reaction causing severe rash involving mucus membranes or skin necrosis:No Has patient had a PCN reaction that required hospitalization:No Has patient had a PCN reaction occurring within the last 10 years:No If all of the above answers are "NO", then may proceed with Cephalosporin use.     Patient Measurements: Height: 5\' 1"  (154.9 cm) Weight: 165 lb 11.9 oz (75.18 kg) IBW/kg (Calculated) : 47.8  Vital Signs: Temp: 98.9 F (37.2 C) (03/06 0459) Temp Source: Oral (03/06 0459) BP: 130/63 mmHg (03/06 0459) Pulse Rate: 76 (03/06 0459)  Labs:  Recent Labs  03/26/15 0940 03/27/15 0500 03/28/15 0415  HGB 7.7* 6.9* 8.7*  HCT 24.8* 22.2* 27.2*  PLT 157 146* 175  LABPROT 17.8* 17.5* 19.8*  INR 1.46 1.43 1.68*  CREATININE 2.81* 3.00* 2.97*    Estimated Creatinine Clearance: 17.5 mL/min (by C-G formula based on Cr of 2.97).  Assessment: 65 yo F admitted 03/13/2015 on warfarin PTA. Patient has had a complicated INR course this admission- most recently, warfarin was held and patient received Vit K + FFP in anticipation of procedure, however patient refused and warfarin  resumed 3/3.  INR 1.68. Hgb improved to 8.7 after transfusion yesterday, plts wnl. No s/sx of bleeding noted.  Goal of Therapy:  INR 2-3 Monitor platelets by anticoagulation protocol: Yes   Plan:  - Give warfarin 2 mg x1 tonight- note it will take some time for INR to come up as pt is s/p vitamin K, however will not give large doses as she has been very sensitive to warfarin - Monitor daily INR, CBC and s/sx of bleeding  Kaesyn Johnston D. Yahaira Bruski, PharmD, BCPS Clinical Pharmacist Pager: [phone] 03/28/2015 8:42 AM

## 2015-03-28 NOTE — Clinical Social Work Placement (Signed)
   CLINICAL SOCIAL WORK PLACEMENT  NOTE 03/28/15 - DISCHARGED TO [facility]  Date:  03/28/2015  Patient Details  Name: Debbie [name] MRN: [id number] Date of Birth: February 02, 1950  Clinical Social Work is seeking post-discharge placement for this patient at the [place] level of care (*CSW will initial, date and re-position this form in  chart as items are completed):      Patient/family provided with [place] Work Department's list of facilities offering this level of care within the geographic area requested by the patient (or if unable, by the patient's family).      Patient/family informed of their freedom to choose among providers that offer the needed level of care, that participate in Medicare, Medicaid or managed care program needed by the patient, have an available bed and are willing to accept the patient.      Patient/family informed of [facility]'s ownership interest in [facility] and [facility], as well as of the fact that they are under no obligation to receive care at these facilities.  PASRR submitted to EDS on 03/26/15     PASRR number received on 03/26/15     Existing PASRR number confirmed on       FL2 transmitted to all facilities in geographic area requested by pt/family on 03/26/15     FL2 transmitted to all facilities within larger geographic area on       Patient informed that his/her managed care company has contracts with or will negotiate with certain facilities, including the following:         03/28/15 - Patient/family informed of bed offers received.  [facility]     Physician recommends and patient chooses bed at      Patient to be transferred to  [facility] on  03/28/15.  Patient to be transferred to facility by  ambulance     Patient family notified on  03/28/15 of transfer.  Name of family member notified:   Daughter [name] or [name] - at the  bedside.     PHYSICIAN Please prepare priority discharge summary, including medications, Please prepare prescriptions, Please sign FL2     Additional Comment:    _______________________________________________ Sable Feil, LCSW 03/28/2015, 3:48 PM

## 2015-03-28 NOTE — Discharge Summary (Signed)
Physician Discharge Summary  AVETT BUONOCORE [id number] DOB: 16-Apr-1950 DOA: 03/13/2015  PCP: No PCP Per Patient  Admit date: 03/13/2015 Discharge date: 03/28/2015  Time spent: 40 minutes  Recommendations for Outpatient Follow-up:  1. Follow-up with primary care physician within one week. 2. Continue ampicillin for 4 more weeks, and date is 04/26/2015. 3. Follow INR in 3 days, CBC and BMP within 1 week.   Discharge Diagnoses:  Principal Problem:   Enterococcal bacteremia Active Problems:   CKD (chronic kidney disease), stage IV (HCC)   HTN (hypertension)   Chronic anticoagulation - with Coumadin   Dyslipidemia   Pulmonary hypertension ([place])   NSTEMI- Jan 2017- medical Rx   Renal transplant recipient   Coagulopathy [facility])   Acute renal failure superimposed on stage 4 chronic kidney disease (HCC)   Cardiomyopathy, ischemic-40-45%   Supratherapeutic INR   LBBB (left bundle branch block)   Bacteremia   Arm pain, right   Pyogenic arthritis of right shoulder region [facility])   Effusion of shoulder joint   Discharge Condition: Stable  Diet recommendation: Heart healthy/renal diet  Filed Weights   03/26/15 2040 03/27/15 0500 03/27/15 2040  Weight: 66.5 kg (146 lb 9.7 oz) 74.118 kg (163 lb 6.4 oz) 75.18 kg (165 lb 11.9 oz)    History of present illness:  Debbie [name] is a 65 y.o. female with history of renal transplant with stage III renal disease, who was recently admitted for acute on chronic renal disease with non-ST elevation MI and at that time 2-D echo done showed EF of 40-45%, paroxysmal atrial fibrillation on Coumadin and metoprolol was brought to the ER after patient was complaining of dizziness and weakness since yesterday morning. Patient states that over the last few days patient has not been eating well. Denies any vomiting. Denies any abdominal pain and has been having some constipation. Patient otherwise denies any chest pain or shortness of breath. In the ER patient  blood pressures found to be in the low normal and was given fluid bolus. Lab work revealed worsening renal function with creatinine around 4 and patient's baseline creatinine is around 2.7-2.9. In addition sodium was also in the lower side. Patient has been admitted for further management of acute on chronic renal failure with INR markedly elevated.   Hospital Course:   Enterococcus bacteremia -Source unclear. 2/20: 1/2 blood cultures positive for enterococcus. 2nd blood culture positive for enterococcus and Coag neg staphylococcus. 2/22: Blood culture 2: Also positive - Infectious disease follow-up appreciated. ID recommends TEE - patient had been contemplating and reluctant all along but has finally consented to having it on 2/26. - Cubicin changed to IV ampicillin & Rocephin. Blood culture from 2/24 still NGTD. - Infectious disease recommended ampicillin for total of 6 weeks, and 80s 04/26/2015.  Acute on chronic renal failure (CKD 4) in pt with renal transplant -Due to volume depletion and infection -Creatinine baseline 2.2 on 1/30. Admitted with creatinine of 4.31 and is gradually improving (3.41 on 2/23). -Patient endorses compliance with all her medications including immunosuppressive therapy -Renal ultrasound: Native kidneys not identified possibly due to atrophy. Transplanted kidney without hydronephrosis. -Follow renal function closely, currently around baseline at 2.7.  Right shoulder septic arthritis -Enterococcus acute septic arthritis of the right shoulder, shoulder aspirate growing enterococcus -Dr. Sharol Given recommended surgery with I&D and antibiotic wash, patient declined any surgery. -Patient wants only antibiotics, I explained to her the infection might not go away completely with antibiotics only.  -Per ID 1 more month  of ampicillin  Mild hyperkalemia -Resolved after a dose of Kayexalate.  Chest pain and new LBBB on EKG -Concerning for angina with new left bundle branch  block -relieved after SL NTG x 1 -EKG--new LBBB -pt had NSTEMI last admission in Jan 2017 -02/15/2015 echo EF 40-45%, PAP 60 -continue ASA -Continue imdur/Ranexa -Defer Ranexa pharmacy concern to cardiology -Elevated troponins likely due to renal failure -Cardiology follow-up appreciated: Suspect anemia contribute it to her angina. Continue carvedilol, Lipitor, Imdur and Ranexa. Cardiology does not recommend any further workup and had signed off 2/22. However due to recurrent NTG responsive chest pain, cardiology has seen again on 2/23. EKG without acute changes and no LBBB at this time (LBBB is intermittent).  -Cardiology has increased dose of Imdur and beta blocker. Ranexa held secondary to renal insufficiency, resumed on discharge.  Right upper extremity pain and swelling -CT scan of the shoulder showed effusion, arthrocentesis showed septic arthritis. -The swelling secondary to septic arthritis and the innominate vein stenosis.  Hyponatremia -Secondary to volume depletion -Sodium normal today.  Paroxysmal atrial fibrillation -Presently in sinus rhythm -Rate controlled -Continue warfarin. INR therapeutic.  Warfarin-induced coagulopathy -Patient has supratherapeutic INR--8.43 on admission. Decreased. -As the patient is not actively bleeding and hemodynamically stable, allow INR to drift down -Patient received vitamin K and 4 units of FFP anticipating that she's been ago for right shoulder surgery, patient declined the surgery. -INR is 1.68 on discharge, continue Coumadin.  Hypertension -Continue carvedilol and Imdur. Dose increased by cardiology  SLE/positive anticardiolipin antibodies/history of DVT/PE:  -On chronic Coumadin at home. INR as above.. Status post renal transplant with chronic allograft nephropathy: Continue immunosuppressants  Anemia of CKD -Baseline hemoglobin 9-10 -Transfuse 1 unit of packed RBCs, hemoglobin is 6.9, total transfusion of 2 units since  admission. -Hemoglobin on discharge is 8.7.  Right fibular and medial malleolus fracture -As per cardiology, patient would be high risk for surgery with a recent NSTEMI -Continue CAM boot. -Outpt follow up with Dr. Ninfa Linden   Procedures:  Placement of central venous line on 33 by the critical care service.  Transesophageal echocardiogram done on 03/22/2015 by cardiology.  Consultations:  Orthopedics.  Nephrology.  Cardiology.  Infectious disease.  Discharge Exam: Filed Vitals:   03/27/15 2040 03/28/15 0459  BP: 125/51 130/63  Pulse: 75 76  Temp: 99.7 F (37.6 C) 98.9 F (37.2 C)  Resp: 16 17   General: Alert and awake, oriented x3, not in any acute distress. HEENT: anicteric sclera, pupils reactive to light and accommodation, EOMI CVS: S1-S2 clear, no murmur rubs or gallops Chest: clear to auscultation bilaterally, no wheezing, rales or rhonchi Abdomen: soft nontender, nondistended, normal bowel sounds, no organomegaly Extremities: no cyanosis, clubbing or edema noted bilaterally Neuro: Cranial nerves II-XII intact, no focal neurological deficits  Discharge Instructions   Discharge Instructions    Diet - low sodium heart healthy    Complete by:  As directed      Increase activity slowly    Complete by:  As directed           Current Discharge Medication List    START taking these medications   Details  ampicillin 2 g in sodium chloride 0.9 % 50 mL Inject 2 g into the vein every 8 (eight) hours.      CONTINUE these medications which have CHANGED   Details  oxyCODONE-acetaminophen (PERCOCET/ROXICET) 5-325 MG tablet Take 0.5-1 tablets by mouth every 6 (six) hours as needed for moderate pain or severe pain. Qty:  10 tablet, Refills: 0      CONTINUE these medications which have NOT CHANGED   Details  acetaminophen (TYLENOL) 325 MG tablet Take 2 tablets (650 mg total) by mouth every 6 (six) hours as needed for mild pain (or Fever >/= 101).     allopurinol (ZYLOPRIM) 100 MG tablet Take 2 tablets (200 mg total) by mouth daily.    atorvastatin (LIPITOR) 40 MG tablet Take 1 tablet (40 mg total) by mouth daily at 6 PM. Qty: 30 tablet, Refills: 0    bimatoprost (LUMIGAN) 0.01 % SOLN Place 1 drop into both eyes at bedtime.     Brinzolamide-Brimonidine (SIMBRINZA) 1-0.2 % SUSP Place 1 drop into the left eye 3 (three) times daily.    calcitRIOL (ROCALTROL) 0.25 MCG capsule Take 0.25-0.5 mcg by mouth every other day. alternating dosages    carvedilol (COREG) 6.25 MG tablet Take 1 tablet (6.25 mg total) by mouth 2 (two) times daily with a meal. Qty: 60 tablet, Refills: 0    furosemide (LASIX) 40 MG tablet Take 1 tablet (40 mg total) by mouth daily. Qty: 30 tablet, Refills: 0    isosorbide mononitrate (IMDUR) 60 MG 24 hr tablet Take 1 tablet (60 mg total) by mouth daily. Qty: 30 tablet, Refills: 0    MAGNESIUM-OXIDE 400 (241.3 MG) MG tablet TAKE 1 TABLET BY MOUTH TWICE DAILY Qty: 60 tablet, Refills: 3    mycophenolate (MYFORTIC) 180 MG EC tablet Take 180 mg by mouth 2 (two) times daily.     nitroGLYCERIN (NITROSTAT) 0.4 MG SL tablet Place 0.4 mg under the tongue every 5 (five) minutes as needed for chest pain.     predniSONE (DELTASONE) 5 MG tablet Take 5 mg by mouth daily with breakfast.     RANEXA 500 MG 12 hr tablet TAKE 1 TABLET BY MOUTH TWICE DAILY Qty: 180 tablet, Refills: 1    ranitidine (ZANTAC) 150 MG tablet Take 150 mg by mouth daily.     sodium bicarbonate 650 MG tablet Take 1,300 mg by mouth 2 (two) times daily.     tacrolimus (PROGRAF) 1 MG capsule Take 2 mg by mouth 2 (two) times daily.     warfarin (COUMADIN) 2.5 MG tablet Take 0.5-1 tablets (1.25-2.5 mg total) by mouth daily at 6 PM. Monday and Friday 2.5 mg and 1.25 mg on all other days Qty: 30 tablet, Refills: 0    methocarbamol (ROBAXIN) 500 MG tablet Take 1 tablet (500 mg total) by mouth 2 (two) times daily. Qty: 20 tablet, Refills: 0    timolol  (TIMOPTIC) 0.5 % ophthalmic solution Place 1 drop into both eyes 2 (two) times daily. Reported on 03/13/2015      STOP taking these medications     isosorbide dinitrate (ISORDIL) 10 MG tablet      labetalol (NORMODYNE) 100 MG tablet        Allergies  Allergen Reactions  . Feraheme [Ferumoxytol] Other (See Comments)    Chest pain, pulsating 02/17/15: tolerated Nulecit  . Vancomycin Anaphylaxis, Itching, Swelling and Other (See Comments)    Tongue swell  . Dorzolamide Hcl-Timolol Mal Rash and Other (See Comments)    Eye pain  . Gentamycin [Gentamicin Sulfate] Itching and Swelling  . Latanoprost Rash and Other (See Comments)    Eye pain  . Cefazolin Itching  . Codeine Itching  . Hydrocodone-Acetaminophen Itching  . Penicillins Itching    *tolerated ampicillin during 02/2015 admission Has patient had a PCN reaction causing immediate rash, facial/tongue/throat swelling, SOB  or lightheadedness with hypotension:No Has patient had a PCN reaction causing severe rash involving mucus membranes or skin necrosis:No Has patient had a PCN reaction that required hospitalization:No Has patient had a PCN reaction occurring within the last 10 years:No If all of the above answers are "NO", then may proceed with Cephalosporin use.       The results of significant diagnostics from this hospitalization (including imaging, microbiology, ancillary and laboratory) are listed below for reference.    Significant Diagnostic Studies: Ct Head Wo Contrast  03/14/2015  CLINICAL DATA:  Acute onset of dizziness.  Initial encounter. EXAM: CT HEAD WITHOUT CONTRAST TECHNIQUE: Contiguous axial images were obtained from the base of the skull through the vertex without intravenous contrast. COMPARISON:  CT of the head performed 02/14/2015 FINDINGS: There is no evidence of acute infarction, mass lesion, or intra- or extra-axial hemorrhage on CT. Prominence of the ventricles sulci reflects mild cortical volume loss. The  brainstem and fourth ventricle are within normal limits. The basal ganglia are unremarkable in appearance. The cerebral hemispheres demonstrate grossly normal gray-white differentiation. No mass effect or midline shift is seen. There is no evidence of fracture; visualized osseous structures are unremarkable in appearance. The visualized portions of the orbits are within normal limits. The paranasal sinuses and mastoid air cells are well-aerated. No significant soft tissue abnormalities are seen. IMPRESSION: 1. No acute intracranial pathology seen on CT. 2. Mild cortical volume loss noted. Electronically Signed   By: Garald Balding M.D.   On: 03/14/2015 00:35   Ct Humerus Right Wo Contrast  03/18/2015  CLINICAL DATA:  [place] 3-4 days ago.  Injured right upper extremity. EXAM: CT OF THE RIGHT SHOULDER WITHOUT CONTRAST; CT OF THE RIGHT HUMERUS WITHOUT CONTRAST; CT OF THE RIGHT FOREARM WITHOUT CONTRAST TECHNIQUE: Multidetector CT imaging was performed according to the standard protocol. Multiplanar CT image reconstructions were also generated. COMPARISON:  None. FINDINGS: No acute fractures are identified. The shoulder joint is maintained. No fracture or dislocation. Mild AC joint degenerative changes. No AC joint separation. No humeral fracture. There is extensive subacromial/subdeltoid and subscapularis bursitis. There is also a large glenohumeral joint effusion and severe synovitis. Dialysis grafts are noted in the right upper extremity. The elbow joint is maintained. No elbow fracture or obvious joint effusion. Headache cardiac No forearm fractures are identified. There is a moderate-sized right pleural effusion with overlying atelectasis. No obvious acute right rib fractures are identified. The thoracic vertebral bodies are intact. Moderate degenerative changes noted at the sternoclavicular joints. Small scarred right kidney is noted. There is advanced atherosclerotic calcifications involving the aorta and branch  vessels. IMPRESSION: No acute fractures of the right upper extremity are identified. The joints are maintained. Extensive synovitis, joint effusion and bursitis involving the right shoulder. Electronically Signed   By: Marijo Sanes M.D.   On: 03/18/2015 09:11   Ct Shoulder Right Wo Contrast  03/18/2015  CLINICAL DATA:  [place] 3-4 days ago.  Injured right upper extremity. EXAM: CT OF THE RIGHT SHOULDER WITHOUT CONTRAST; CT OF THE RIGHT HUMERUS WITHOUT CONTRAST; CT OF THE RIGHT FOREARM WITHOUT CONTRAST TECHNIQUE: Multidetector CT imaging was performed according to the standard protocol. Multiplanar CT image reconstructions were also generated. COMPARISON:  None. FINDINGS: No acute fractures are identified. The shoulder joint is maintained. No fracture or dislocation. Mild AC joint degenerative changes. No AC joint separation. No humeral fracture. There is extensive subacromial/subdeltoid and subscapularis bursitis. There is also a large glenohumeral joint effusion and severe synovitis. Dialysis grafts  are noted in the right upper extremity. The elbow joint is maintained. No elbow fracture or obvious joint effusion. Headache cardiac No forearm fractures are identified. There is a moderate-sized right pleural effusion with overlying atelectasis. No obvious acute right rib fractures are identified. The thoracic vertebral bodies are intact. Moderate degenerative changes noted at the sternoclavicular joints. Small scarred right kidney is noted. There is advanced atherosclerotic calcifications involving the aorta and branch vessels. IMPRESSION: No acute fractures of the right upper extremity are identified. The joints are maintained. Extensive synovitis, joint effusion and bursitis involving the right shoulder. Electronically Signed   By: Marijo Sanes M.D.   On: 03/18/2015 09:11   Ct Forearm Right Wo Contrast  03/18/2015  CLINICAL DATA:  [place] 3-4 days ago.  Injured right upper extremity. EXAM: CT OF THE RIGHT SHOULDER  WITHOUT CONTRAST; CT OF THE RIGHT HUMERUS WITHOUT CONTRAST; CT OF THE RIGHT FOREARM WITHOUT CONTRAST TECHNIQUE: Multidetector CT imaging was performed according to the standard protocol. Multiplanar CT image reconstructions were also generated. COMPARISON:  None. FINDINGS: No acute fractures are identified. The shoulder joint is maintained. No fracture or dislocation. Mild AC joint degenerative changes. No AC joint separation. No humeral fracture. There is extensive subacromial/subdeltoid and subscapularis bursitis. There is also a large glenohumeral joint effusion and severe synovitis. Dialysis grafts are noted in the right upper extremity. The elbow joint is maintained. No elbow fracture or obvious joint effusion. Headache cardiac No forearm fractures are identified. There is a moderate-sized right pleural effusion with overlying atelectasis. No obvious acute right rib fractures are identified. The thoracic vertebral bodies are intact. Moderate degenerative changes noted at the sternoclavicular joints. Small scarred right kidney is noted. There is advanced atherosclerotic calcifications involving the aorta and branch vessels. IMPRESSION: No acute fractures of the right upper extremity are identified. The joints are maintained. Extensive synovitis, joint effusion and bursitis involving the right shoulder. Electronically Signed   By: Marijo Sanes M.D.   On: 03/18/2015 09:11   US Renal  03/15/2015  CLINICAL DATA:  Acute renal failure superimposed on stage 4 chronic kidney disease. EXAM: RENAL / URINARY TRACT ULTRASOUND COMPLETE COMPARISON:  Included portions from chest CT 02/18/2015 FINDINGS: Right Kidney: Not visualized. Left Kidney: Not visualized. Transplant kidney located in the pelvis, side not specified. There is no transplant hydronephrosis. Blood flow seen. Transplant kidney measures 10 cm. Transplant cortical thickness appears maintained. No focal lesion. Detailed transplant Doppler evaluation not  performed. Bladder: Appears normal for degree of bladder distention. IMPRESSION: 1. Native kidneys not identified, likely secondary renal atrophy. 2. Pelvic transplant kidney without hydronephrosis. Electronically Signed   By: Jeb Levering M.D.   On: 03/15/2015 23:08   Dg Chest Port 1 View  03/26/2015  CLINICAL DATA:  Sepsis.  Pneumonia. EXAM: PORTABLE CHEST 1 VIEW COMPARISON:  March 25, 2015 FINDINGS: No pneumothorax. Diffuse left lung infiltrate is again identified, obscuring the left heart border, similar in the interval given difference in technique. No other changes. Stable left IJ. IMPRESSION: Stable diffuse left lung infiltrate.  No other changes. Electronically Signed   By: Dorise Bullion III M.D   On: 03/26/2015 09:26   Dg Chest Port 1 View  03/25/2015  CLINICAL DATA:  Central line placement. EXAM: PORTABLE CHEST 1 VIEW COMPARISON:  03/25/2015. FINDINGS: Left IJ central line has been slightly retracted, its tip is at the level of the superior vena cava. Heart size stable. Persistent left lung diffuse infiltrate. Small left pleural effusion. No pneumothorax. IMPRESSION: 1.  Left IJ line has been partially retracted, its tip is at the level of the superior vena cava. No pneumothorax. 2.  Diffuse left lung infiltrate again noted. Electronically Signed   By: Marcello Moores  Register   On: 03/25/2015 17:01   Dg Chest Port 1 View  03/25/2015  CLINICAL DATA:  Central line placement EXAM: PORTABLE CHEST 1 VIEW COMPARISON:  03/14/2015 FINDINGS: Left internal jugular central line has been placed and crosses midline to the right side with tip over the anticipated position of the brachiocephalic vein. No pneumothorax. Increased opacity throughout the lingula IMPRESSION: Central line projects with tip over the brachiocephalic vein on the right and should be retracted about 5 cm and re- advanced with repeat imaging. Lingular opacity Critical Value/emergent results were called by telephone at the time of interpretation  on 03/25/2015 at 4:15 pm to Sybil, the patient's nurse, who verbally acknowledged these results. Electronically Signed   By: Skipper Cliche M.D.   On: 03/25/2015 16:17   Dg Chest Portable 1 View  03/14/2015  CLINICAL DATA:  Fever and weakness tonight. EXAM: PORTABLE CHEST 1 VIEW COMPARISON:  Radiograph 02/18/2015.  Chest CT 02/16/2015 FINDINGS: Cardiomediastinal contours are unchanged allowing for patient rotation. Small left pleural effusion, diminished from prior. No evidence of right pleural effusion. Left basilar scarring. No pulmonary edema. No confluent airspace disease. No pneumothorax. IMPRESSION: Persistent but decreased left pleural effusion. Exam is otherwise unchanged. Electronically Signed   By: Jeb Levering M.D.   On: 03/14/2015 02:09   Dg Fluoro Guided Needle Plc Aspiration/injection Loc  03/21/2015  CLINICAL DATA:  Sinusitis, joint effusion and bursitis involving the right shoulder on CT 03/17/2015. EXAM: RIGHT SHOULDER ASPIRATION UNDER FLUOROSCOPY TECHNIQUE: Despite an INR of 3.99, Dr. Meridee Score insisted on a relatively emergent right shoulder aspiration due to the possibility of sepsis. The increased risk of bleeding was discussed with the patient prior to the procedure, as well as the risk of infection. Patient elected to proceed with the procedure. Informed consent was obtained. An appropriate skin entrance site was determined. The site was marked, prepped with Betadine, draped in the usual sterile fashion, and infiltrated locally with buffered Lidocaine. Based on CT imaging, 80 20 gauge spinal needle was inserted over the mid humeral head and 5-60 cc of serosanguineous fluid returned. No immediate complication. FLUOROSCOPY TIME:  Radiation Exposure Index (as provided by the fluoroscopic device): If the device does not provide the exposure index: Fluoroscopy Time (in minutes and seconds):  0 minutes 24 seconds. Number of Acquired Images:  None. FINDINGS: Despite an INR of 3.99, Dr.  Meridee Score insisted on a relatively emergent right shoulder aspiration due to the possibility of sepsis. The increased risk of bleeding was discussed with the patient prior to the procedure, as well as the risk of infection. Patient elected to proceed with the procedure. Informed consent was obtained. An appropriate skin entrance site was determined. The site was marked, prepped with Betadine, draped in the usual sterile fashion, and infiltrated locally with buffered Lidocaine. Based on CT imaging, a 20 gauge spinal needle was inserted over the mid humeral head with return of 5-6 cc of serosanguineous fluid. No immediate complication. IMPRESSION: Technically successful right shoulder aspiration. Electronically Signed   By: Lorin Picket M.D.   On: 03/21/2015 15:16    Microbiology: Recent Results (from the past 240 hour(s))  Body fluid culture     Status: None   Collection Time: 03/21/15  2:00 PM  Result Value Ref Range Status  Specimen Description FLUID SYNOVIAL RIGHT SHOULDER  Final   Special Requests NONE  Final   Gram Stain   Final    ABUNDANT WBC PRESENT, PREDOMINANTLY PMN NO ORGANISMS SEEN    Culture   Final    FEW ENTEROCOCCUS SPECIES CRITICAL RESULT CALLED TO, READ BACK BY AND VERIFIED WITH: R HILL,RN AT 1324 03/24/15 BY L BENFIELD    Report Status 03/26/2015 FINAL  Final   Organism ID, Bacteria ENTEROCOCCUS SPECIES  Final      Susceptibility   Enterococcus species - MIC*    AMPICILLIN <=2 SENSITIVE Sensitive     VANCOMYCIN 1 SENSITIVE Sensitive     GENTAMICIN SYNERGY SENSITIVE Sensitive     * FEW ENTEROCOCCUS SPECIES  C difficile quick scan w PCR reflex     Status: None   Collection Time: 03/25/15  2:53 AM  Result Value Ref Range Status   C Diff antigen NEGATIVE NEGATIVE Final   C Diff toxin NEGATIVE NEGATIVE Final   C Diff interpretation Negative for toxigenic C. difficile  Final     Labs: Basic Metabolic Panel:  Recent Labs Lab 03/22/15 1416 03/25/15 1034  03/26/15 0940 03/27/15 0500 03/28/15 0415  NA 139 138 139 138 140  K 5.0 4.6 4.5 4.5 4.3  CL 103 101 101 100* 102  CO2 23 25 28 28 28   GLUCOSE 146* 138* 131* 116* 125*  BUN 46* 44* 50* 49* 46*  CREATININE 2.34* 2.73* 2.81* 3.00* 2.97*  CALCIUM 9.8 10.0 9.9 9.8 9.6  PHOS 2.4*  --   --   --   --    Liver Function Tests:  Recent Labs Lab 03/22/15 1416  ALBUMIN 2.1*   No results for input(s): LIPASE, AMYLASE in the last 168 hours. No results for input(s): AMMONIA in the last 168 hours. CBC:  Recent Labs Lab 03/22/15 1641  03/24/15 0554 03/25/15 0614 03/26/15 0940 03/27/15 0500 03/28/15 0415  WBC 7.8  < > 7.2 6.8 7.0 5.7 7.0  NEUTROABS 5.9  --   --   --   --   --   --   HGB 8.8*  < > 9.2* 7.9* 7.7* 6.9* 8.7*  HCT 28.0*  < > 27.6* 23.8* 24.8* 22.2* 27.2*  MCV 84.6  < > 85.4 85.3 85.8 86.4 85.3  PLT 136*  < > 150 141* 157 146* 175  < > = values in this interval not displayed. Cardiac Enzymes: No results for input(s): CKTOTAL, CKMB, CKMBINDEX, TROPONINI in the last 168 hours. BNP: BNP (last 3 results) No results for input(s): BNP in the last 8760 hours.  ProBNP (last 3 results) No results for input(s): PROBNP in the last 8760 hours.  CBG:  Recent Labs Lab 03/26/15 0745 03/26/15 1201  GLUCAP 118* 126*       Signed:  Enrique Manganaro A MD.  [facility] 03/28/2015, 9:54 AM

## 2015-03-28 NOTE — Progress Notes (Signed)
Patient ID: Debbie [name], female   DOB: 01/16/1951, 65 y.o.   MRN: [id number]         [facility] for Infectious Disease    Date of Admission:  03/13/2015   Total days of antibiotics 15         [name] had enterococcal bacteremia complicated by right shoulder infection. She refused right shoulder incision and drainage. I have recommended a total of 6 weeks of antibiotic therapy. She will continue ampicillin through 04/26/2015. I will sign off now.         Michel Bickers, MD [facility] for Infectious [place] [facility] [phone] pager   [phone] cell 01/25/2015, 1:32 PM

## 2015-03-28 NOTE — Care Management Note (Signed)
Case Management Note  Patient Details  Name: Debbie [name] MRN: [id number] Date of Birth: 1950/05/11  Subjective/Objective:              CM following for progression and d/c planning.      Action/Plan: 03/28/2015 Notified by CSW, Felecia Shelling that pt and daughter have accepted a SNF bed at [facility] for d/c care. This CM notified [place] that this pt will not be d/c to home and will not need HHRN and IV antibiotics. D/C summary reviewed and orders for IV antibiotics complete pt with SVC central line.    Expected Discharge Date:    03/28/2015              Expected Discharge Plan:  Skilled Nursing Facility  In-House Referral:  Clinical Social Work  Discharge planning Services  CM Consult  Post Acute Care Choice:  NA Choice offered to:     DME Arranged:  N/A DME Agency:  NA  HH Arranged:    [place] Agency:  NA  Status of Service:  Completed, signed off  Medicare Important Message Given:  Yes Date Medicare IM Given:    Medicare IM give by:    Date Additional Medicare IM Given:    Additional Medicare Important Message give by:     If discussed at [place] of Stay Meetings, dates discussed:    Additional Comments:  Adron Bene, RN 03/28/2015, 11:15 AM

## 2015-04-04 ENCOUNTER — Other Ambulatory Visit: Payer: Self-pay

## 2015-04-04 NOTE — Patient Outreach (Signed)
Second unsuccessful attempt made to contact patient via telephone for transition of care. Will make another attempt tomorrow, March 14

## 2015-04-04 NOTE — Patient Outreach (Signed)
Per EPIC, patient discharged to [facility] on March 6. Will follow for disposition from skilled nursing facility.

## 2015-04-05 ENCOUNTER — Ambulatory Visit: Payer: Medicare Other

## 2015-04-06 ENCOUNTER — Encounter (HOSPITAL_COMMUNITY): Payer: Self-pay

## 2015-04-06 ENCOUNTER — Emergency Department (HOSPITAL_COMMUNITY): Admit: 2015-04-06 | Discharge: 2015-04-06 | Disposition: A | Discharge status: Home / Self Care | Payer: Medicare Other

## 2015-04-06 ENCOUNTER — Inpatient Hospital Stay (HOSPITAL_COMMUNITY)
Admission: EM | Admit: 2015-04-06 | Discharge: 2015-04-14 | DRG: 291 | Disposition: A | Discharge status: Skilled Nursing Facility | Payer: Medicare Other | Attending: Internal Medicine | Admitting: Internal Medicine

## 2015-04-06 ENCOUNTER — Emergency Department (HOSPITAL_COMMUNITY): Payer: Medicare Other

## 2015-04-06 DIAGNOSIS — Z66 Do not resuscitate: Secondary | ICD-10-CM | POA: Diagnosis present

## 2015-04-06 DIAGNOSIS — R079 Chest pain, unspecified: Secondary | ICD-10-CM | POA: Diagnosis present

## 2015-04-06 DIAGNOSIS — M00811 Arthritis due to other bacteria, right shoulder: Secondary | ICD-10-CM | POA: Diagnosis present

## 2015-04-06 DIAGNOSIS — I209 Angina pectoris, unspecified: Secondary | ICD-10-CM

## 2015-04-06 DIAGNOSIS — D631 Anemia in chronic kidney disease: Secondary | ICD-10-CM | POA: Diagnosis present

## 2015-04-06 DIAGNOSIS — I5043 Acute on chronic combined systolic (congestive) and diastolic (congestive) heart failure: Secondary | ICD-10-CM | POA: Diagnosis present

## 2015-04-06 DIAGNOSIS — I480 Paroxysmal atrial fibrillation: Secondary | ICD-10-CM | POA: Diagnosis present

## 2015-04-06 DIAGNOSIS — B952 Enterococcus as the cause of diseases classified elsewhere: Secondary | ICD-10-CM | POA: Diagnosis present

## 2015-04-06 DIAGNOSIS — I130 Hypertensive heart and chronic kidney disease with heart failure and stage 1 through stage 4 chronic kidney disease, or unspecified chronic kidney disease: Secondary | ICD-10-CM | POA: Diagnosis present

## 2015-04-06 DIAGNOSIS — D689 Coagulation defect, unspecified: Secondary | ICD-10-CM | POA: Diagnosis present

## 2015-04-06 DIAGNOSIS — I10 Essential (primary) hypertension: Secondary | ICD-10-CM | POA: Diagnosis present

## 2015-04-06 DIAGNOSIS — D649 Anemia, unspecified: Secondary | ICD-10-CM

## 2015-04-06 DIAGNOSIS — B9620 Unspecified Escherichia coli [E. coli] as the cause of diseases classified elsewhere: Secondary | ICD-10-CM | POA: Diagnosis present

## 2015-04-06 DIAGNOSIS — D638 Anemia in other chronic diseases classified elsewhere: Secondary | ICD-10-CM | POA: Diagnosis present

## 2015-04-06 DIAGNOSIS — E785 Hyperlipidemia, unspecified: Secondary | ICD-10-CM | POA: Diagnosis present

## 2015-04-06 DIAGNOSIS — D6861 Antiphospholipid syndrome: Secondary | ICD-10-CM | POA: Diagnosis present

## 2015-04-06 DIAGNOSIS — M25551 Pain in right hip: Secondary | ICD-10-CM | POA: Diagnosis present

## 2015-04-06 DIAGNOSIS — I509 Heart failure, unspecified: Secondary | ICD-10-CM

## 2015-04-06 DIAGNOSIS — I272 Other secondary pulmonary hypertension: Secondary | ICD-10-CM | POA: Diagnosis present

## 2015-04-06 DIAGNOSIS — I2510 Atherosclerotic heart disease of native coronary artery without angina pectoris: Secondary | ICD-10-CM | POA: Diagnosis present

## 2015-04-06 DIAGNOSIS — Z940 Kidney transplant status: Secondary | ICD-10-CM | POA: Diagnosis not present

## 2015-04-06 DIAGNOSIS — M7989 Other specified soft tissue disorders: Secondary | ICD-10-CM | POA: Diagnosis not present

## 2015-04-06 DIAGNOSIS — R791 Abnormal coagulation profile: Secondary | ICD-10-CM | POA: Diagnosis present

## 2015-04-06 DIAGNOSIS — I255 Ischemic cardiomyopathy: Secondary | ICD-10-CM | POA: Diagnosis present

## 2015-04-06 DIAGNOSIS — N184 Chronic kidney disease, stage 4 (severe): Secondary | ICD-10-CM | POA: Diagnosis present

## 2015-04-06 DIAGNOSIS — I252 Old myocardial infarction: Secondary | ICD-10-CM

## 2015-04-06 DIAGNOSIS — R7881 Bacteremia: Secondary | ICD-10-CM

## 2015-04-06 DIAGNOSIS — Z87891 Personal history of nicotine dependence: Secondary | ICD-10-CM

## 2015-04-06 DIAGNOSIS — M899 Disorder of bone, unspecified: Secondary | ICD-10-CM

## 2015-04-06 DIAGNOSIS — Z7901 Long term (current) use of anticoagulants: Secondary | ICD-10-CM | POA: Diagnosis not present

## 2015-04-06 DIAGNOSIS — K219 Gastro-esophageal reflux disease without esophagitis: Secondary | ICD-10-CM | POA: Diagnosis present

## 2015-04-06 DIAGNOSIS — Z7952 Long term (current) use of systemic steroids: Secondary | ICD-10-CM

## 2015-04-06 DIAGNOSIS — R509 Fever, unspecified: Secondary | ICD-10-CM

## 2015-04-06 DIAGNOSIS — I5031 Acute diastolic (congestive) heart failure: Secondary | ICD-10-CM | POA: Diagnosis not present

## 2015-04-06 LAB — PROTIME-INR
INR: >10 (ref 0.00–1.49)
INR: >10 (ref 0.00–1.49)
PROTHROMBIN TIME: 76.6 s — AB (ref 11.6–15.2)
Prothrombin Time: 80.7 seconds — ABNORMAL HIGH (ref 11.6–15.2)

## 2015-04-06 LAB — CBC WITH DIFFERENTIAL/PLATELET
Basophils Absolute: 0 10*3/uL (ref 0.0–0.1)
Basophils Relative: 0 %
EOS ABS: 0.2 10*3/uL (ref 0.0–0.7)
Eosinophils Relative: 2 %
HEMATOCRIT: 21.6 % — AB (ref 36.0–46.0)
HEMOGLOBIN: 6.6 g/dL — AB (ref 12.0–15.0)
LYMPHS ABS: 2.2 10*3/uL (ref 0.7–4.0)
Lymphocytes Relative: 31 %
MCH: 26.7 pg (ref 26.0–34.0)
MCHC: 30.6 g/dL (ref 30.0–36.0)
MCV: 87.4 fL (ref 78.0–100.0)
MONO ABS: 0.7 10*3/uL (ref 0.1–1.0)
MONOS PCT: 9 %
NEUTROS ABS: 4.2 10*3/uL (ref 1.7–7.7)
NEUTROS PCT: 58 %
Platelets: 171 10*3/uL (ref 150–400)
RBC: 2.47 MIL/uL — ABNORMAL LOW (ref 3.87–5.11)
RDW: 17.4 % — AB (ref 11.5–15.5)
WBC: 7.3 10*3/uL (ref 4.0–10.5)

## 2015-04-06 LAB — CBC
HCT: 21.6 % — ABNORMAL LOW (ref 36.0–46.0)
Hemoglobin: 6.8 g/dL — CL (ref 12.0–15.0)
MCH: 27.3 pg (ref 26.0–34.0)
MCHC: 31.5 g/dL (ref 30.0–36.0)
MCV: 86.7 fL (ref 78.0–100.0)
PLATELETS: 176 10*3/uL (ref 150–400)
RBC: 2.49 MIL/uL — AB (ref 3.87–5.11)
RDW: 17.4 % — ABNORMAL HIGH (ref 11.5–15.5)
WBC: 7.8 10*3/uL (ref 4.0–10.5)

## 2015-04-06 LAB — BASIC METABOLIC PANEL
ANION GAP: 14 (ref 5–15)
BUN: 29 mg/dL — ABNORMAL HIGH (ref 6–20)
CALCIUM: 8.9 mg/dL (ref 8.9–10.3)
CO2: 26 mmol/L (ref 22–32)
CREATININE: 2.81 mg/dL — AB (ref 0.44–1.00)
Chloride: 102 mmol/L (ref 101–111)
GFR calc non Af Amer: 17 mL/min — ABNORMAL LOW (ref >60)
GFR, EST AFRICAN AMERICAN: 19 mL/min — AB (ref >60)
GLUCOSE: 109 mg/dL — AB (ref 65–99)
Potassium: 3.8 mmol/L (ref 3.5–5.1)
SODIUM: 142 mmol/L (ref 135–145)

## 2015-04-06 LAB — TROPONIN I
TROPONIN I: 0.05 ng/mL — AB (ref <0.031)
Troponin I: 0.11 ng/mL — ABNORMAL HIGH (ref <0.031)
Troponin I: 0.19 ng/mL — ABNORMAL HIGH (ref <0.031)

## 2015-04-06 LAB — HEPATIC FUNCTION PANEL
ALT: 16 U/L (ref 14–54)
AST: 42 U/L — AB (ref 15–41)
Albumin: 2 g/dL — ABNORMAL LOW (ref 3.5–5.0)
Alkaline Phosphatase: 65 U/L (ref 38–126)
BILIRUBIN DIRECT: 0.2 mg/dL (ref 0.1–0.5)
BILIRUBIN INDIRECT: 0.7 mg/dL (ref 0.3–0.9)
TOTAL PROTEIN: 5 g/dL — AB (ref 6.5–8.1)
Total Bilirubin: 0.9 mg/dL (ref 0.3–1.2)

## 2015-04-06 LAB — PROCALCITONIN: Procalcitonin: 0.23 ng/mL

## 2015-04-06 LAB — I-STAT TROPONIN, ED: TROPONIN I, POC: 0.06 ng/mL (ref 0.00–0.08)

## 2015-04-06 LAB — PREPARE RBC (CROSSMATCH)

## 2015-04-06 LAB — BRAIN NATRIURETIC PEPTIDE: B Natriuretic Peptide: 717.5 pg/mL — ABNORMAL HIGH (ref 0.0–100.0)

## 2015-04-06 MED ORDER — ONDANSETRON HCL 4 MG/2ML IJ SOLN: 4.0000 mg | Freq: Four times a day (QID) | INTRAMUSCULAR | Status: DC | PRN

## 2015-04-06 MED ORDER — DEXTROSE 5 % IV SOLN: 2.0000 g | Freq: Once | INTRAVENOUS | Status: DC

## 2015-04-06 MED ORDER — MYCOPHENOLATE SODIUM 180 MG PO TBEC
180.0000 mg | DELAYED_RELEASE_TABLET | Freq: Two times a day (BID) | ORAL | Status: DC
  Filled 2015-04-06 (×2): qty 1

## 2015-04-06 MED ORDER — TIMOLOL MALEATE 0.5 % OP SOLN
1.0000 [drp] | Freq: Two times a day (BID) | OPHTHALMIC | Status: DC
  Administered 2015-04-06 – 2015-04-14 (×17): 1 [drp] via OPHTHALMIC
  Filled 2015-04-06 (×2): qty 5

## 2015-04-06 MED ORDER — TACROLIMUS 1 MG PO CAPS
2.0000 mg | ORAL_CAPSULE | Freq: Two times a day (BID) | ORAL | Status: DC
  Filled 2015-04-06 (×2): qty 2

## 2015-04-06 MED ORDER — FUROSEMIDE 10 MG/ML IJ SOLN
20.0000 mg | Freq: Once | INTRAMUSCULAR | Status: AC
  Administered 2015-04-06: 20 mg via INTRAVENOUS
  Filled 2015-04-06: qty 2

## 2015-04-06 MED ORDER — RANOLAZINE ER 500 MG PO TB12
500.0000 mg | ORAL_TABLET | Freq: Two times a day (BID) | ORAL | Status: DC
  Administered 2015-04-07 – 2015-04-14 (×16): 500 mg via ORAL
  Filled 2015-04-06 (×16): qty 1

## 2015-04-06 MED ORDER — SODIUM CHLORIDE 0.9% FLUSH
3.0000 mL | Freq: Two times a day (BID) | INTRAVENOUS | Status: DC
  Administered 2015-04-06 – 2015-04-07 (×3): 3 mL via INTRAVENOUS
  Administered 2015-04-08: 10 mL via INTRAVENOUS
  Administered 2015-04-08 – 2015-04-13 (×6): 3 mL via INTRAVENOUS

## 2015-04-06 MED ORDER — CALCITRIOL 0.25 MCG PO CAPS
0.2500 ug | ORAL_CAPSULE | ORAL | Status: DC
  Filled 2015-04-06: qty 2

## 2015-04-06 MED ORDER — SODIUM CHLORIDE 0.9% FLUSH: 3.0000 mL | INTRAVENOUS | Status: DC | PRN

## 2015-04-06 MED ORDER — SODIUM BICARBONATE 650 MG PO TABS
1300.0000 mg | ORAL_TABLET | Freq: Two times a day (BID) | ORAL | Status: DC
  Administered 2015-04-06 – 2015-04-14 (×16): 1300 mg via ORAL
  Filled 2015-04-06 (×22): qty 2

## 2015-04-06 MED ORDER — SODIUM CHLORIDE 0.9 % IV SOLN
Freq: Once | INTRAVENOUS | Status: AC
  Administered 2015-04-06: 17:00:00 via INTRAVENOUS

## 2015-04-06 MED ORDER — CALCITRIOL 0.25 MCG PO CAPS
0.2500 ug | ORAL_CAPSULE | ORAL | Status: DC
  Administered 2015-04-07 – 2015-04-13 (×4): 0.25 ug via ORAL
  Filled 2015-04-06 (×4): qty 1

## 2015-04-06 MED ORDER — ATORVASTATIN CALCIUM 40 MG PO TABS
40.0000 mg | ORAL_TABLET | Freq: Every day | ORAL | Status: DC
  Administered 2015-04-06 – 2015-04-13 (×8): 40 mg via ORAL
  Filled 2015-04-06 (×6): qty 1
  Filled 2015-04-06: qty 4
  Filled 2015-04-06: qty 1

## 2015-04-06 MED ORDER — LATANOPROST 0.005 % OP SOLN
1.0000 [drp] | Freq: Every day | OPHTHALMIC | Status: DC
  Administered 2015-04-06 – 2015-04-13 (×8): 1 [drp] via OPHTHALMIC
  Filled 2015-04-06 (×2): qty 2.5

## 2015-04-06 MED ORDER — ACETAMINOPHEN 325 MG PO TABS
650.0000 mg | ORAL_TABLET | ORAL | Status: DC | PRN
  Filled 2015-04-06: qty 2

## 2015-04-06 MED ORDER — SODIUM CHLORIDE 0.9 % IV SOLN
2.0000 g | Freq: Three times a day (TID) | INTRAVENOUS | Status: DC
  Administered 2015-04-06 – 2015-04-14 (×23): 2 g via INTRAVENOUS
  Filled 2015-04-06 (×28): qty 2000

## 2015-04-06 MED ORDER — ISOSORBIDE MONONITRATE ER 60 MG PO TB24
60.0000 mg | ORAL_TABLET | Freq: Every day | ORAL | Status: DC
  Administered 2015-04-06 – 2015-04-13 (×8): 60 mg via ORAL
  Filled 2015-04-06 (×9): qty 1

## 2015-04-06 MED ORDER — NITROGLYCERIN 0.4 MG SL SUBL
0.4000 mg | SUBLINGUAL_TABLET | SUBLINGUAL | Status: DC | PRN
  Administered 2015-04-08 – 2015-04-12 (×2): 0.4 mg via SUBLINGUAL
  Filled 2015-04-06 (×2): qty 1

## 2015-04-06 MED ORDER — MYCOPHENOLATE SODIUM 180 MG PO TBEC
180.0000 mg | DELAYED_RELEASE_TABLET | Freq: Two times a day (BID) | ORAL | Status: DC
  Administered 2015-04-07 – 2015-04-14 (×15): 180 mg via ORAL
  Filled 2015-04-06 (×23): qty 1

## 2015-04-06 MED ORDER — SODIUM CHLORIDE 0.9 % IV SOLN: Freq: Once | INTRAVENOUS | Status: DC

## 2015-04-06 MED ORDER — ACETAMINOPHEN 325 MG PO TABS: 650.0000 mg | ORAL_TABLET | ORAL | Status: DC | PRN

## 2015-04-06 MED ORDER — FAMOTIDINE 20 MG PO TABS
20.0000 mg | ORAL_TABLET | Freq: Every day | ORAL | Status: DC
  Administered 2015-04-06 – 2015-04-14 (×9): 20 mg via ORAL
  Filled 2015-04-06 (×9): qty 1

## 2015-04-06 MED ORDER — TACROLIMUS 1 MG PO CAPS
2.0000 mg | ORAL_CAPSULE | Freq: Two times a day (BID) | ORAL | Status: DC
  Administered 2015-04-07 – 2015-04-14 (×16): 2 mg via ORAL
  Filled 2015-04-06 (×22): qty 2

## 2015-04-06 MED ORDER — NITROGLYCERIN 2 % TD OINT
1.0000 [in_us] | TOPICAL_OINTMENT | Freq: Four times a day (QID) | TRANSDERMAL | Status: DC
  Administered 2015-04-06 – 2015-04-07 (×2): 1 [in_us] via TOPICAL
  Filled 2015-04-06: qty 30
  Filled 2015-04-06: qty 1

## 2015-04-06 MED ORDER — RANOLAZINE ER 500 MG PO TB12
500.0000 mg | ORAL_TABLET | Freq: Two times a day (BID) | ORAL | Status: DC
  Filled 2015-04-06 (×2): qty 1

## 2015-04-06 MED ORDER — METHOCARBAMOL 500 MG PO TABS
500.0000 mg | ORAL_TABLET | Freq: Two times a day (BID) | ORAL | Status: DC
  Administered 2015-04-06 – 2015-04-14 (×17): 500 mg via ORAL
  Filled 2015-04-06 (×17): qty 1

## 2015-04-06 MED ORDER — CALCITRIOL 0.25 MCG PO CAPS: 0.2500 ug | ORAL_CAPSULE | ORAL | Status: DC

## 2015-04-06 MED ORDER — CARVEDILOL 6.25 MG PO TABS
6.2500 mg | ORAL_TABLET | Freq: Two times a day (BID) | ORAL | Status: DC
  Administered 2015-04-06 – 2015-04-14 (×16): 6.25 mg via ORAL
  Filled 2015-04-06 (×16): qty 1

## 2015-04-06 MED ORDER — FUROSEMIDE 10 MG/ML IJ SOLN
40.0000 mg | Freq: Every day | INTRAMUSCULAR | Status: DC
  Administered 2015-04-06 – 2015-04-12 (×7): 40 mg via INTRAVENOUS
  Filled 2015-04-06 (×7): qty 4

## 2015-04-06 MED ORDER — MAGNESIUM OXIDE 400 (241.3 MG) MG PO TABS
400.0000 mg | ORAL_TABLET | Freq: Two times a day (BID) | ORAL | Status: DC
  Administered 2015-04-06 – 2015-04-14 (×17): 400 mg via ORAL
  Filled 2015-04-06 (×18): qty 1

## 2015-04-06 MED ORDER — PREDNISONE 5 MG PO TABS
5.0000 mg | ORAL_TABLET | Freq: Every day | ORAL | Status: DC
  Administered 2015-04-07 – 2015-04-14 (×8): 5 mg via ORAL
  Filled 2015-04-06 (×9): qty 1

## 2015-04-06 MED ORDER — CALCITRIOL 0.5 MCG PO CAPS
0.5000 ug | ORAL_CAPSULE | ORAL | Status: DC
  Administered 2015-04-08 – 2015-04-14 (×4): 0.5 ug via ORAL
  Filled 2015-04-06: qty 2
  Filled 2015-04-06: qty 1
  Filled 2015-04-06: qty 2
  Filled 2015-04-06 (×2): qty 1

## 2015-04-06 MED ORDER — MORPHINE SULFATE (PF) 4 MG/ML IV SOLN
4.0000 mg | Freq: Once | INTRAVENOUS | Status: AC
Start: 2015-04-06 — End: 2015-04-06
  Administered 2015-04-06: 4 mg via INTRAVENOUS
  Filled 2015-04-06: qty 1

## 2015-04-06 MED ORDER — FUROSEMIDE 10 MG/ML IJ SOLN
40.0000 mg | Freq: Once | INTRAMUSCULAR | Status: AC
  Administered 2015-04-06: 40 mg via INTRAVENOUS
  Filled 2015-04-06: qty 4

## 2015-04-06 MED ORDER — SODIUM CHLORIDE 0.9 % IV SOLN
250.0000 mL | INTRAVENOUS | Status: DC | PRN
  Administered 2015-04-08: 250 mL via INTRAVENOUS

## 2015-04-06 MED ORDER — OXYCODONE-ACETAMINOPHEN 5-325 MG PO TABS
0.5000 | ORAL_TABLET | Freq: Four times a day (QID) | ORAL | Status: DC | PRN
  Administered 2015-04-07 – 2015-04-13 (×8): 1 via ORAL
  Filled 2015-04-06 (×8): qty 1

## 2015-04-06 MED ORDER — PHYTONADIONE 5 MG PO TABS
5.0000 mg | ORAL_TABLET | Freq: Once | ORAL | Status: AC
  Administered 2015-04-06: 5 mg via ORAL
  Filled 2015-04-06: qty 1

## 2015-04-06 NOTE — Care Management Note (Signed)
Case Management Note  Patient Details  Name: Debbie [name] MRN: [id number] Date of Birth: 1950-05-29  Subjective/Objective:               Pt presents from [place] today with complaints of chest pain and sob episodes of left sided chest pain, nonradiating with associated sob. Patient recently discharged 03/28/15  from [facility] hospital to [place]. Prior to Rehab patient lived alone with family support at home fairly independent. Patient does not have a PCP but is active with [facility]. Discussed TCC program with patient she is agreeable once patient is discharged from Rehab to establish care. TCC CM were notified.   Action/Plan: Anticipate discharge when medically stable back to SNF  Expected Discharge Date:                  Expected Discharge Plan:  [facility] In-House Referral:  Clinical Social Work  Discharge planning Services  CM Consult  Post Acute Care Choice:    Choice offered to:  Patient  DME Arranged:    DME Agency:     HH Arranged:    [place] Agency:     Status of Service:  In process, will continue to follow  Medicare Important Message Given:    Date Medicare IM Given:    Medicare IM give by:    Date Additional Medicare IM Given:    Additional Medicare Important Message give by:     If discussed at [place] of Stay Meetings, dates discussed:    Additional CommentsLaurena Slimmer, RN 04/06/2015, 6:41 PM

## 2015-04-06 NOTE — ED Notes (Signed)
Phlebotomy and RN attempted to draw blood cultures x3.  Merrell MD made aware of difficulty and gave v/o to initiate blood prior to obtaining blood cultures.

## 2015-04-06 NOTE — Progress Notes (Signed)
VASCULAR LAB PRELIMINARY  PRELIMINARY  PRELIMINARY  PRELIMINARY  Right lower extremity venous duplex completed.    Preliminary report:  Right:  No evidence of DVT, superficial thrombosis, or Baker's cyst.  Canaan Prue, RVT 04/06/2015, 1:53 PM

## 2015-04-06 NOTE — ED Notes (Signed)
Pt from [place] with left upper chest pain.  Pt recently d/c from hospital for MI and is at facility for rehab.  Pt reports CP began at 0430 this morning and was given nitro x4 by facility which took pain away but it would come back.  Pt given 325mg  ASA PTA.

## 2015-04-06 NOTE — H&P (Signed)
[facility] History and Physical  Debbie [name] [id number] DOB: 15-Feb-1950 DOA: 04/06/2015  Referring physician: ED Dr Fransico Michael PCP: none Nephrologist: Dr. Jimmy Footman  Chief Complaint: chest pain/SOB  HPI: Debbie [name] is a 65 y.o. 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 Recent admission 03/13/2015 through 03/28/2015 for enterococcus bacteremia d/t septic right shoulder joint. She is on ampicillin until 04/26/2015 per ID recommendations. Pt also admitted 02/14/15 through 02/21/15 with A/C kidney disease and NSTEMI. During that hospitalization, pt was again medically managed d/t poor renal function.  Pt presents from [facility] facility today with complaints of chest pain and sob that are now resolved. She describes 3 episodes of left sided chest pain, nonradiating with associated sob. Symptoms improved with SL nitro. She reports increasing swelling to lower extremities for 2 weeks. She denies and headache, dizziness, palpitations, n/v. She denies recent fever, cough, abdominal pain or diarrhea. W/U in ED showed volume overload on CXR with BNP 717.5, hgb 6.8. Pt to be admitted for further evaluation and treatment.     Review of Systems:  No weight loss, night sweats, Fevers, chills, fatigue.  No headaches, Difficulty swallowing,Tooth/dental problems,Sore throat, Orthopnea, PND, swelling in lower extremities, anasarca, dizziness, palpitations  No heartburn, indigestion, abdominal pain, nausea, vomiting, diarrhea, change in bowel habits, loss of appetite  No excess mucus, no productive cough, No non-productive cough, No coughing up of blood.No change in color of mucus.No wheezing.No chest wall deformity  no rash or lesions.  no dysuria, change  in color of urine, no urgency or frequency. No flank pain.  No joint pain or swelling. No decreased range of motion. No back pain.  No change in mood or affect. No depression or anxiety. No memory loss.  No change in sensation, unilateral strength, or cognitive abilities  All other systems were reviewed and are negative.  Past Medical History  Diagnosis Date  . Kidney transplant as cause of abnormal reaction or later complication   . Hypertension   . Colon polyps   . Gall stones 1980  . Anti-phospholipid antibody syndrome [facility])     (Based on hospital discharge summary march 2013)  . Paroxysmal atrial fibrillation (HCC)   . Carpal tunnel syndrome 2003    lt  . Esophageal stricture   . Hemorrhoids   . Renal disorder   . ESRD (end stage renal disease) on dialysis [facility])     "til I got my transplant in 2010"  . DVT (deep venous thrombosis) (HCC) 1990    LLE  . History of blood transfusion     "related to the lupus"  . Dyslipidemia 07/22/2013  . Coronary artery disease   . Heart murmur     mild MR on echo  . Pneumonia     "3 times now" (08/25/2013)  . GERD (gastroesophageal reflux disease)   . Arthritis     "fingers" (08/25/2013)  . Glaucoma, left eye   . Bilateral carotid artery stenosis     1-39%  . Pulmonary HTN (HCC)     PASP 62mmHg   Past Surgical History  Procedure Laterality Date  . Nephrectomy transplanted organ  05/2008  . Cholecystectomy  1980  . Hematoma evacuation  1998    "after they took peritoneal catheter out"  . Peritoneal catheter insertion    .  Peritoneal catheter removal    . Carpal tunnel release Right 07/2001  . Arteriovenous graft placement    . Thrombectomy and revision of arterioventous (av) goretex  graft Right 07/1999; 04/2002; 09/2002; 12/2005; 09/2007;     upper arm/notes 07/31/1999; 05/18/2002; 10/07/2002; 01/14/2006; 09/14/2007;   . Right heart catheterization N/A 08/28/2013    Procedure: RIGHT HEART CATH;  Surgeon: Sinclair Grooms, MD;  Location: [facility]  CATH LAB;  Service: Cardiovascular;  Laterality: N/A;  . Tee without cardioversion N/A 03/22/2015    Procedure: TRANSESOPHAGEAL ECHOCARDIOGRAM (TEE);  Surgeon: Josue Hector, MD;  Location: [facility] ENDOSCOPY;  Service: Cardiovascular;  Laterality: N/A;    Social History:  reports that she quit smoking about 37 years ago. Her smoking use included Cigarettes. She smoked 0.00 packs per day for 30 years. She has never used smokeless tobacco. She reports that she drinks alcohol. She reports that she does not use illicit drugs. Currently in rehab posthospitalizations, but lives at home alone prior to this  Allergies  Allergen Reactions  . Feraheme [Ferumoxytol] Other (See Comments)    Chest pain, pulsating 02/17/15: tolerated Nulecit  . Vancomycin Anaphylaxis, Itching, Swelling and Other (See Comments)    Tongue swell  . Dorzolamide Hcl-Timolol Mal Rash and Other (See Comments)    Eye pain  . Gentamycin [Gentamicin Sulfate] Itching and Swelling  . Latanoprost Rash and Other (See Comments)    Eye pain  . Cefazolin Itching  . Codeine Itching  . Hydrocodone-Acetaminophen Itching  . Penicillins Itching    *tolerated ampicillin during 02/2015 admission Has patient had a PCN reaction causing immediate rash, facial/tongue/throat swelling, SOB or lightheadedness with hypotension:No Has patient had a PCN reaction causing severe rash involving mucus membranes or skin necrosis:No Has patient had a PCN reaction that required hospitalization:No Has patient had a PCN reaction occurring within the last 10 years:No If all of the above answers are "NO", then may proceed with Cephalosporin use.     Family History  Problem Relation Age of Onset  . Hypertension Mother   . Heart disease Mother   . Colon cancer Neg Hx   . Rectal cancer Neg Hx   . Stomach cancer Neg Hx      Prior to Admission medications   Medication Sig Start Date End Date Taking? Authorizing Provider  acetaminophen (TYLENOL) 325 MG tablet  Take 2 tablets (650 mg total) by mouth every 6 (six) hours as needed for mild pain (or Fever >/= 101). 02/21/15   Modena Jansky, MD  allopurinol (ZYLOPRIM) 100 MG tablet Take 2 tablets (200 mg total) by mouth daily. 02/21/15   Modena Jansky, MD  ampicillin 2 g in sodium chloride 0.9 % 50 mL Inject 2 g into the vein every 8 (eight) hours. 03/28/15   Verlee Monte, MD  atorvastatin (LIPITOR) 40 MG tablet Take 1 tablet (40 mg total) by mouth daily at 6 PM. 02/21/15   Modena Jansky, MD  bimatoprost (LUMIGAN) 0.01 % SOLN Place 1 drop into both eyes at bedtime.  05/21/13   Historical Provider, MD  Brinzolamide-Brimonidine (SIMBRINZA) 1-0.2 % SUSP Place 1 drop into the left eye 3 (three) times daily. 09/14/14   Historical Provider, MD  calcitRIOL (ROCALTROL) 0.25 MCG capsule Take 0.25-0.5 mcg by mouth every other day. alternating dosages    Historical Provider, MD  carvedilol (COREG) 6.25 MG tablet Take 1 tablet (6.25 mg total) by mouth 2 (two) times daily with a meal. 02/21/15   Modena Jansky,  MD  furosemide (LASIX) 40 MG tablet Take 1 tablet (40 mg total) by mouth daily. 02/21/15   Modena Jansky, MD  isosorbide mononitrate (IMDUR) 60 MG 24 hr tablet Take 1 tablet (60 mg total) by mouth daily. 02/21/15   Modena Jansky, MD  MAGNESIUM-OXIDE 400 (241.3 MG) MG tablet TAKE 1 TABLET BY MOUTH TWICE DAILY 10/26/14   Sueanne Margarita, MD  methocarbamol (ROBAXIN) 500 MG tablet Take 1 tablet (500 mg total) by mouth 2 (two) times daily. Patient not taking: Reported on 03/03/2015 01/23/15   Marella Chimes, PA-C  mycophenolate (MYFORTIC) 180 MG EC tablet Take 180 mg by mouth 2 (two) times daily.     Historical Provider, MD  nitroGLYCERIN (NITROSTAT) 0.4 MG SL tablet Place 0.4 mg under the tongue every 5 (five) minutes as needed for chest pain.  08/26/13   Historical Provider, MD  oxyCODONE-acetaminophen (PERCOCET/ROXICET) 5-325 MG tablet Take 0.5-1 tablets by mouth every 6 (six) hours as needed for moderate pain or  severe pain. 03/28/15   Verlee Monte, MD  predniSONE (DELTASONE) 5 MG tablet Take 5 mg by mouth daily with breakfast.  04/24/12   Historical Provider, MD  RANEXA 500 MG 12 hr tablet TAKE 1 TABLET BY MOUTH TWICE DAILY 01/11/15   Sueanne Margarita, MD  ranitidine (ZANTAC) 150 MG tablet Take 150 mg by mouth daily.     Historical Provider, MD  sodium bicarbonate 650 MG tablet Take 1,300 mg by mouth 2 (two) times daily.  07/13/11   Historical Provider, MD  tacrolimus (PROGRAF) 1 MG capsule Take 2 mg by mouth 2 (two) times daily.     Historical Provider, MD  timolol (TIMOPTIC) 0.5 % ophthalmic solution Place 1 drop into both eyes 2 (two) times daily. Reported on 03/13/2015 05/21/13   Historical Provider, MD  warfarin (COUMADIN) 2.5 MG tablet Take 0.5-1 tablets (1.25-2.5 mg total) by mouth daily at 6 PM. Monday and Friday 2.5 mg and 1.25 mg on all other days 02/21/15   Modena Jansky, MD   Physical Exam: Filed Vitals:   04/06/15 1132 04/06/15 1145 04/06/15 1147  BP:  114/66   Pulse:   90  Resp:  23 26  SpO2: 100%  97%    Wt Readings from Last 3 Encounters:  03/27/15 75.18 kg (165 lb 11.9 oz)  02/21/15 65.6 kg (144 lb 10 oz)  01/04/15 68.04 kg (150 lb)    General: Awake, alert sitting upright in bed in NAD Eyes:  EOMI, normal lids ENT: grossly normal hearing, lips & tongue Neck: no LAD, masses or thyromegaly Cardiovascular: RRR, SEM. 2+ pitting edema midshin bilateral LEs, Respiratory: mild bibasilar crackles, no w/r/r. Normal respiratory effort. Abdomen:  soft, ntnd, BS+ Skin:  no rash or induration seen on limited exam Musculoskeletal: grossly normal tone BUE/BLE, swelling to right thigh nontender Psychiatric: grossly normal mood and affect, speech fluent and appropriate Neurologic: CN 2-12 grossly intact, moves all extremities in coordinated fashion.          Labs on Admission:  Basic Metabolic Panel:  Recent Labs Lab 04/06/15 1200  NA 142  K 3.8  CL 102  CO2 26  GLUCOSE 109*  BUN  29*  CREATININE 2.81*  CALCIUM 8.9   Liver Function Tests:  Recent Labs Lab 04/06/15 1200  AST 42*  ALT 16  ALKPHOS 65  BILITOT 0.9  PROT 5.0*  ALBUMIN 2.0*   No results for input(s): LIPASE, AMYLASE in the last 168 hours. No results  for input(s): AMMONIA in the last 168 hours. CBC:  Recent Labs Lab 04/06/15 1200  WBC 7.8  HGB 6.8*  HCT 21.6*  MCV 86.7  PLT 176   Cardiac Enzymes:  Recent Labs Lab 04/06/15 1200  TROPONINI 0.05*    BNP (last 3 results)  Recent Labs  04/06/15 1200  BNP 717.5*     CREATININE: 2.81 mg/dL ABNORMAL (04/06/15 1200) Estimated creatinine clearance - 18.5 mL/min  Radiological Exams on Admission: Dg Chest Port 1 View  04/06/2015  CLINICAL DATA:  Chest pain EXAM: PORTABLE CHEST 1 VIEW COMPARISON:  March 26, 2015 FINDINGS: There is extensive airspace consolidation throughout the lungs bilaterally, more pronounced on the left than on the right. There is underlying interstitial edema. There is mild cardiomegaly with mild pulmonary venous hypertension. Central catheter tip is at the junction of the left innominate vein and superior vena cava. No pneumothorax. There is arthropathy in both shoulders. IMPRESSION: Widespread airspace consolidation bilaterally, more on the left than on the right, progressed from most recent prior study. Underlying interstitial edema. Suspect widespread pneumonia superimposed on congestive heart failure. Aspiration could present in this manner as well. No pneumothorax evident. Electronically Signed   By: Lowella Grip III M.D.   On: 04/06/2015 12:03    EKG: Independently reviewed. 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 No need to repeat at this time. -uncertain of baseline weight as there are some discrepancies in previous notes. 155lb today -will diurese gently given hx renal transplant, I/O's  Chest pain with hx of CAD -previous admissions for same. Pt not a candidate for cath given CKD/renal transplant per previous cardiology consultations. No evidence of infx etiology with normal WBC, afebrile, no cough -will transfuse PRBCs as anemia could be contributing -cycle cardiac enzymes -She refused nitro gtt during previous hospitalization, but ok with nitro paste -continue Imdur, BB, statin  Coagulopathy on chronic Coumadin with hx of Antiphospholipid syndrome/DVT -verify INR. Will need Vit K if >10 -follow CBC -u/s RLE ordered by ED for swelling to thigh  Anemia -chronic disease with Iron studies done 01/2014. Baseline Hgb ~9.0  -No evidence of acute blood loss despite INR>10 -will transfuse 2 units PRBCs given chest pain -Would like benefit from epo. Will defer to renal   Enteroccoccus bacteremia secondary to septic right shoulder -will order f/u blood cultures.  -continue ampicillin until 04/26/2015 per ID recommendations  CKD, stage IV -near baseline now. Monitor closely with diuresis -continue home dose bicarb and calcitrol  Hypertension -well-controlled on home BB -Monitor  Hx kidney transplant 2010 -continue chronic immunosuppression  Hx pafib -currently in NSR, rate controlled -INR supratherapeutic  Hx gout -hold allopurinol while diuresing with hx CKD   Code Status: Pt requests DNR. Verified understanding DVT Prophylaxis: supratherapeutic INR Family Communication: daughter and granddaughter at bedside on admission evaluation Disposition Plan: Pending Improvement    Patrici Ranks, NP-C [place]  pgr [phone]

## 2015-04-06 NOTE — ED Provider Notes (Signed)
CSN: [id number]     Arrival date & time 04/06/15  1127 History   First MD Initiated Contact with Patient 04/06/15 1133     Chief Complaint  Patient presents with  . Chest Pain  . Leg Swelling     (Consider location/radiation/quality/duration/timing/severity/associated sxs/prior Treatment) HPI Patient is currently in nursing home facility for rehabilitation post MI. This morning she developed chest pain that was resolving with administration of nitroglycerin. Aspirin has been administered. Pain continued to recur. A she reports her pain is in her left central chest. She reports it has come back now and rates it an 8 out of 10. She does have some shortness of breath associated. No nausea, vomiting or abdominal pain. She denies a significant amount coughing or fever. She reports her legs have been swelling but qualifies she had a ankle fracture back in December that has been swollen. She states recently however the swelling has been much increased and she is very swollen and tender into her thigh as well. She reports for activity she only does transfers to the bathroom. Past Medical History  Diagnosis Date  . Kidney transplant as cause of abnormal reaction or later complication   . Hypertension   . Colon polyps   . Gall stones 1980  . Anti-phospholipid antibody syndrome [facility])     (Based on hospital discharge summary march 2013)  . Paroxysmal atrial fibrillation (HCC)   . Carpal tunnel syndrome 2003    lt  . Esophageal stricture   . Hemorrhoids   . Renal disorder   . ESRD (end stage renal disease) on dialysis [facility])     "til I got my transplant in 2010"  . DVT (deep venous thrombosis) (HCC) 1990    LLE  . History of blood transfusion     "related to the lupus"  . Dyslipidemia 07/22/2013  . Coronary artery disease   . Heart murmur     mild MR on echo  . Pneumonia     "3 times now" (08/25/2013)  . GERD (gastroesophageal reflux disease)   . Arthritis     "fingers" (08/25/2013)  .  Glaucoma, left eye   . Bilateral carotid artery stenosis     1-39%  . Pulmonary HTN (HCC)     PASP 65mmHg   Past Surgical History  Procedure Laterality Date  . Nephrectomy transplanted organ  05/2008  . Cholecystectomy  1980  . Hematoma evacuation  1998    "after they took peritoneal catheter out"  . Peritoneal catheter insertion    . Peritoneal catheter removal    . Carpal tunnel release Right 07/2001  . Arteriovenous graft placement    . Thrombectomy and revision of arterioventous (av) goretex  graft Right 07/1999; 04/2002; 09/2002; 12/2005; 09/2007;     upper arm/notes 07/31/1999; 05/18/2002; 10/07/2002; 01/14/2006; 09/14/2007;   . Right heart catheterization N/A 08/28/2013    Procedure: RIGHT HEART CATH;  Surgeon: Sinclair Grooms, MD;  Location[facility] CATH LAB;  Service: Cardiovascular;  Laterality: N/A;  . Tee without cardioversion N/A 03/22/2015    Procedure: TRANSESOPHAGEAL ECHOCARDIOGRAM (TEE);  Surgeon: Josue Hector, MD;  Location: [facility] ENDOSCOPY;  Service: Cardiovascular;  Laterality: N/A;   Family History  Problem Relation Age of Onset  . Hypertension Mother   . Heart disease Mother   . Colon cancer Neg Hx   . Rectal cancer Neg Hx   . Stomach cancer Neg Hx    Social History  Substance Use Topics  . Smoking status:  Former Smoker -- 0.00 packs/day for 30 years    Types: Cigarettes    Quit date: 04/16/1977  . Smokeless tobacco: Never Used  . Alcohol Use: Yes   OB History    Gravida Para Term Preterm AB TAB SAB Ectopic Multiple Living   1              Review of Systems  10 Systems reviewed and are negative for acute change except as noted in the HPI.   Allergies  Feraheme; Vancomycin; Dorzolamide hcl-timolol mal; Gentamycin; Latanoprost; Cefazolin; Codeine; Hydrocodone-acetaminophen; and Penicillins  Home Medications   Prior to Admission medications   Medication Sig Start Date End Date Taking? Authorizing Provider  acetaminophen (TYLENOL) 325 MG tablet Take 2 tablets  (650 mg total) by mouth every 6 (six) hours as needed for mild pain (or Fever >/= 101). 02/21/15   Modena Jansky, MD  allopurinol (ZYLOPRIM) 100 MG tablet Take 2 tablets (200 mg total) by mouth daily. 02/21/15   Modena Jansky, MD  ampicillin 2 g in sodium chloride 0.9 % 50 mL Inject 2 g into the vein every 8 (eight) hours. 03/28/15   Verlee Monte, MD  atorvastatin (LIPITOR) 40 MG tablet Take 1 tablet (40 mg total) by mouth daily at 6 PM. 02/21/15   Modena Jansky, MD  bimatoprost (LUMIGAN) 0.01 % SOLN Place 1 drop into both eyes at bedtime.  05/21/13   Historical Provider, MD  Brinzolamide-Brimonidine (SIMBRINZA) 1-0.2 % SUSP Place 1 drop into the left eye 3 (three) times daily. 09/14/14   Historical Provider, MD  calcitRIOL (ROCALTROL) 0.25 MCG capsule Take 0.25-0.5 mcg by mouth every other day. alternating dosages    Historical Provider, MD  carvedilol (COREG) 6.25 MG tablet Take 1 tablet (6.25 mg total) by mouth 2 (two) times daily with a meal. 02/21/15   Modena Jansky, MD  furosemide (LASIX) 40 MG tablet Take 1 tablet (40 mg total) by mouth daily. 02/21/15   Modena Jansky, MD  isosorbide mononitrate (IMDUR) 60 MG 24 hr tablet Take 1 tablet (60 mg total) by mouth daily. 02/21/15   Modena Jansky, MD  MAGNESIUM-OXIDE 400 (241.3 MG) MG tablet TAKE 1 TABLET BY MOUTH TWICE DAILY 10/26/14   Sueanne Margarita, MD  methocarbamol (ROBAXIN) 500 MG tablet Take 1 tablet (500 mg total) by mouth 2 (two) times daily. Patient not taking: Reported on 03/03/2015 01/23/15   Marella Chimes, PA-C  mycophenolate (MYFORTIC) 180 MG EC tablet Take 180 mg by mouth 2 (two) times daily.     Historical Provider, MD  nitroGLYCERIN (NITROSTAT) 0.4 MG SL tablet Place 0.4 mg under the tongue every 5 (five) minutes as needed for chest pain.  08/26/13   Historical Provider, MD  oxyCODONE-acetaminophen (PERCOCET/ROXICET) 5-325 MG tablet Take 0.5-1 tablets by mouth every 6 (six) hours as needed for moderate pain or severe pain.  03/28/15   Verlee Monte, MD  predniSONE (DELTASONE) 5 MG tablet Take 5 mg by mouth daily with breakfast.  04/24/12   Historical Provider, MD  RANEXA 500 MG 12 hr tablet TAKE 1 TABLET BY MOUTH TWICE DAILY 01/11/15   Sueanne Margarita, MD  ranitidine (ZANTAC) 150 MG tablet Take 150 mg by mouth daily.     Historical Provider, MD  sodium bicarbonate 650 MG tablet Take 1,300 mg by mouth 2 (two) times daily.  07/13/11   Historical Provider, MD  tacrolimus (PROGRAF) 1 MG capsule Take 2 mg by mouth 2 (two) times daily.  Historical Provider, MD  timolol (TIMOPTIC) 0.5 % ophthalmic solution Place 1 drop into both eyes 2 (two) times daily. Reported on 03/13/2015 05/21/13   Historical Provider, MD  warfarin (COUMADIN) 2.5 MG tablet Take 0.5-1 tablets (1.25-2.5 mg total) by mouth daily at 6 PM. Monday and Friday 2.5 mg and 1.25 mg on all other days 02/21/15   Modena Jansky, MD   BP 126/64 mmHg  Pulse 80  Resp 16  Wt 155 lb 8 oz (70.534 kg)  SpO2 100%  LMP  (LMP Unknown) Physical Exam  Constitutional: She is oriented to person, place, and time.  Patient is very deconditioned appearing. She is alert and nontoxic. Mild increased work of breathing.  HENT:  Head: Normocephalic and atraumatic.  Eyes: EOM are normal. Pupils are equal, round, and reactive to light.  Neck: Neck supple.  Cardiovascular: Normal rate, regular rhythm and intact distal pulses.   Patient appears to have an S3 gallop. 2/6 systolic ejection murmur  Pulmonary/Chest:  Mild increased work of breathing. No gross rails. Rare expiratory wheeze.  Abdominal: Soft. Bowel sounds are normal. She exhibits no distension. There is no tenderness.  Musculoskeletal: Normal range of motion. She exhibits edema. She exhibits no tenderness.  Patient has 2-3+ pitting edema bilateral lower extremities. Apparently healed ankle fracture right lower extremity. Patient is not wearing splint or brace. Ankle has somewhat of a external rotation compared to the left.  Calves are nontender but patient does have large swelling of the right thigh relative to the left.  Neurological: She is alert and oriented to person, place, and time. She has normal strength. Coordination normal. GCS eye subscore is 4. GCS verbal subscore is 5. GCS motor subscore is 6.  Skin: Skin is warm, dry and intact.  Psychiatric: She has a normal mood and affect.    ED Course  Procedures (including critical care time) CRITICAL CARE Performed by: Charlesetta Shanks   Total critical care time: 30 minutes  Critical care time was exclusive of separately billable procedures and treating other patients.  Critical care was necessary to treat or prevent imminent or life-threatening deterioration.  Critical care was time spent personally by me on the following activities: development of treatment plan with patient and/or surrogate as well as nursing, discussions with consultants, evaluation of patient's response to treatment, examination of patient, obtaining history from patient or surrogate, ordering and performing treatments and interventions, ordering and review of laboratory studies, ordering and review of radiographic studies, pulse oximetry and re-evaluation of patient's condition. Labs Review Labs Reviewed  BASIC METABOLIC PANEL - Abnormal; Notable for the following:    Glucose, Bld 109 (*)    BUN 29 (*)    Creatinine, Ser 2.81 (*)    GFR calc non Af Amer 17 (*)    GFR calc Af Amer 19 (*)    All other components within normal limits  CBC - Abnormal; Notable for the following:    RBC 2.49 (*)    Hemoglobin 6.8 (*)    HCT 21.6 (*)    RDW 17.4 (*)    All other components within normal limits  BRAIN NATRIURETIC PEPTIDE - Abnormal; Notable for the following:    B Natriuretic Peptide 717.5 (*)    All other components within normal limits  TROPONIN I - Abnormal; Notable for the following:    Troponin I 0.05 (*)    All other components within normal limits  PROTIME-INR - Abnormal;  Notable for the following:    Prothrombin  Time 76.6 (*)    INR >10.00 (*)    All other components within normal limits  HEPATIC FUNCTION PANEL - Abnormal; Notable for the following:    Total Protein 5.0 (*)    Albumin 2.0 (*)    AST 42 (*)    All other components within normal limits  I-STAT TROPOININ, ED  TYPE AND SCREEN    Imaging Review Dg Chest Port 1 View  04/06/2015  CLINICAL DATA:  Chest pain EXAM: PORTABLE CHEST 1 VIEW COMPARISON:  March 26, 2015 FINDINGS: There is extensive airspace consolidation throughout the lungs bilaterally, more pronounced on the left than on the right. There is underlying interstitial edema. There is mild cardiomegaly with mild pulmonary venous hypertension. Central catheter tip is at the junction of the left innominate vein and superior vena cava. No pneumothorax. There is arthropathy in both shoulders. IMPRESSION: Widespread airspace consolidation bilaterally, more on the left than on the right, progressed from most recent prior study. Underlying interstitial edema. Suspect widespread pneumonia superimposed on congestive heart failure. Aspiration could present in this manner as well. No pneumothorax evident. Electronically Signed   By: Lowella Grip III M.D.   On: 04/06/2015 12:03   I have personally reviewed and evaluated these images and lab results as part of my medical decision-making.   EKG Interpretation   Date/Time:  Wednesday April 06 2015 11:34:20 EDT Ventricular Rate:  88 PR Interval:  155 QRS Duration: 125 QT Interval:  446 QTC Calculation: 540 R Axis:   105 Text Interpretation:  Sinus rhythm Consider left ventricular hypertrophy  Nonspecific T abnormalities, lateral leads Prolonged QT interval agree. no  STEMI. Lateral t wave somewhat changed c/w old Confirmed by Johnney Killian, MD,  Jeannie Done [phone]) on 04/06/2015 2:37:08 PM     Consult: Hospitalist for admission. MDM   Final diagnoses:  Acute on chronic congestive heart failure,  unspecified congestive heart failure type (HCC)  Anemia, unspecified anemia type  Ischemic chest pain (HCC)  Supratherapeutic INR   Patient has multiple medical comorbidities. She has developed chest pain this morning. Well there has been shortness of breath. Chest x-ray shows significant amount of vascular congestion. Clinically the patient also has significant volume overload with large peripheral edema. Lasix initiated. Radiology suggested concurrent pneumonia. At this time, patient is denying cough or fever, she has been on IV ampicillin, she does not have leukocytosis, I have not added additional antibiotics at this time his current symptoms are most consistent with congestive heart failure. She does not show signs of being septic. Vital signs are stable. Patient also has significant anemia. Her does not appear to be any sites of active bleeding. Blood is not initiated at this time as vital signs are stable and there will likely be hemoconcentration as she is diuresis. Patient had asymmetric lower extremity swelling, Doppler ultrasound does not show clot present. Patient's INR is significantly supratherapeutic at 10 without active bleeding. Plan will be for admission.      Charlesetta Shanks, MD 04/06/15 [phone]

## 2015-04-06 NOTE — Progress Notes (Signed)
ANTICOAGULATION CONSULT NOTE - Initial Consult  Pharmacy Consult for warfarin Indication: antiphospholipid antibody syndrome  Allergies  Allergen Reactions  . Feraheme [Ferumoxytol] Other (See Comments)    Chest pain, pulsating 02/17/15: tolerated Nulecit  . Vancomycin Anaphylaxis, Itching, Swelling and Other (See Comments)    Tongue swell  . Dorzolamide Hcl-Timolol Mal Rash and Other (See Comments)    Eye pain  . Gentamycin [Gentamicin Sulfate] Itching and Swelling  . Latanoprost Rash and Other (See Comments)    Eye pain  . Cefazolin Itching  . Codeine Itching  . Hydrocodone-Acetaminophen Itching  . Penicillins Itching    *tolerated ampicillin during 02/2015 admission Has patient had a PCN reaction causing immediate rash, facial/tongue/throat swelling, SOB or lightheadedness with hypotension:No Has patient had a PCN reaction causing severe rash involving mucus membranes or skin necrosis:No Has patient had a PCN reaction that required hospitalization:No Has patient had a PCN reaction occurring within the last 10 years:No If all of the above answers are "NO", then may proceed with Cephalosporin use.     Patient Measurements: Weight: 155 lb 8 oz (70.534 kg)  Vital Signs: BP: 126/64 mmHg (03/15 1230) Pulse Rate: 80 (03/15 1230)  Labs:  Recent Labs  04/06/15 1200  HGB 6.8*  HCT 21.6*  PLT 176  LABPROT 76.6*  INR >10.00*  CREATININE 2.81*  TROPONINI 0.05*    Estimated Creatinine Clearance: 17.9 mL/min (by C-G formula based on Cr of 2.81).   Medical History: Past Medical History  Diagnosis Date  . Kidney transplant as cause of abnormal reaction or later complication   . Hypertension   . Colon polyps   . Gall stones 1980  . Anti-phospholipid antibody syndrome [facility])     (Based on hospital discharge summary march 2013)  . Paroxysmal atrial fibrillation (HCC)   . Carpal tunnel syndrome 2003    lt  . Esophageal stricture   . Hemorrhoids   . Renal disorder   .  ESRD (end stage renal disease) on dialysis [facility])     "til I got my transplant in 2010"  . DVT (deep venous thrombosis) (HCC) 1990    LLE  . History of blood transfusion     "related to the lupus"  . Dyslipidemia 07/22/2013  . Coronary artery disease   . Heart murmur     mild MR on echo  . Pneumonia     "3 times now" (08/25/2013)  . GERD (gastroesophageal reflux disease)   . Arthritis     "fingers" (08/25/2013)  . Glaucoma, left eye   . Bilateral carotid artery stenosis     1-39%  . Pulmonary HTN (HCC)     PASP 22mmHg    Assessment: 31 yof on warfarin pta for antiphospholipid antibody syndrome. INR>10 on admit with no active bleeding. Has been on IV ampicillin outpatient per EDP note. Hg low 6.8, plt wnl.  Goal of Therapy:  INR 2-3 Monitor platelets by anticoagulation protocol: Yes   Plan:  Hold warfarin tonight Daily INR, monitor s/sx bleeding  Elicia Lamp, PharmD, BCPS Clinical Pharmacist Pager [phone] 04/06/2015 3:12 PM

## 2015-04-07 ENCOUNTER — Inpatient Hospital Stay (HOSPITAL_COMMUNITY): Payer: Medicare Other

## 2015-04-07 DIAGNOSIS — I255 Ischemic cardiomyopathy: Secondary | ICD-10-CM

## 2015-04-07 DIAGNOSIS — Z7901 Long term (current) use of anticoagulants: Secondary | ICD-10-CM

## 2015-04-07 LAB — BASIC METABOLIC PANEL
Anion gap: 11 (ref 5–15)
BUN: 31 mg/dL — AB (ref 6–20)
CHLORIDE: 103 mmol/L (ref 101–111)
CO2: 26 mmol/L (ref 22–32)
CREATININE: 2.86 mg/dL — AB (ref 0.44–1.00)
Calcium: 8.3 mg/dL — ABNORMAL LOW (ref 8.9–10.3)
GFR calc Af Amer: 19 mL/min — ABNORMAL LOW (ref >60)
GFR calc non Af Amer: 16 mL/min — ABNORMAL LOW (ref >60)
GLUCOSE: 119 mg/dL — AB (ref 65–99)
Potassium: 4 mmol/L (ref 3.5–5.1)
Sodium: 140 mmol/L (ref 135–145)

## 2015-04-07 LAB — PROCALCITONIN: Procalcitonin: 0.18 ng/mL

## 2015-04-07 LAB — HEMOGLOBIN AND HEMATOCRIT, BLOOD
HCT: 27.5 % — ABNORMAL LOW (ref 36.0–46.0)
HEMATOCRIT: 27.7 % — AB (ref 36.0–46.0)
HEMATOCRIT: 27.3 % — AB (ref 36.0–46.0)
HEMOGLOBIN: 9.2 g/dL — AB (ref 12.0–15.0)
HEMOGLOBIN: 8.5 g/dL — AB (ref 12.0–15.0)
Hemoglobin: 8.7 g/dL — ABNORMAL LOW (ref 12.0–15.0)

## 2015-04-07 LAB — MRSA PCR SCREENING: MRSA BY PCR: NEGATIVE

## 2015-04-07 LAB — TROPONIN I
TROPONIN I: 0.22 ng/mL — AB (ref <0.031)
TROPONIN I: 0.2 ng/mL — AB (ref <0.031)
Troponin I: 0.25 ng/mL — ABNORMAL HIGH (ref <0.031)

## 2015-04-07 LAB — CBC
HEMATOCRIT: 28.7 % — AB (ref 36.0–46.0)
Hemoglobin: 9 g/dL — ABNORMAL LOW (ref 12.0–15.0)
MCH: 27.4 pg (ref 26.0–34.0)
MCHC: 31.4 g/dL (ref 30.0–36.0)
MCV: 87.2 fL (ref 78.0–100.0)
PLATELETS: 157 10*3/uL (ref 150–400)
RBC: 3.29 MIL/uL — ABNORMAL LOW (ref 3.87–5.11)
RDW: 16.4 % — AB (ref 11.5–15.5)
WBC: 8.3 10*3/uL (ref 4.0–10.5)

## 2015-04-07 LAB — PROTIME-INR
INR: 8.08 (ref 0.00–1.49)
Prothrombin Time: 64.5 seconds — ABNORMAL HIGH (ref 11.6–15.2)

## 2015-04-07 MED ORDER — CETYLPYRIDINIUM CHLORIDE 0.05 % MT LIQD
7.0000 mL | Freq: Two times a day (BID) | OROMUCOSAL | Status: DC
  Administered 2015-04-07 – 2015-04-13 (×13): 7 mL via OROMUCOSAL

## 2015-04-07 NOTE — Hospital Discharge Follow-Up (Signed)
[facility]:  This Case Manager received communication from Laurena Slimmer,  ED RN CM regarding patient. She indicated patient does not have a PCP. Plan is for patient to return to SNF upon discharge.  ED RN CM felt patient may benefit from [facility] follow-up after discharge from SNF. This Case Manager met with patient at bedside. Patient very drowsy but indicated the plan is for her to return to [facility] after medical stability for rehabilitation. Patient indicated prior to going to SNF she lived alone and had support from her children who live nearby. Patient indicated she is interested in possible [facility] follow-up and establishing care at [facility] and [facility] after discharge from [facility] patient contact information for [facility] and [facility]. Informed her to follow-up Case Manager when nearing discharge or once discharged from SNF. Patient verbalized understanding. Voicemail left for Donne Anon, RN CM providing update.

## 2015-04-07 NOTE — Progress Notes (Signed)
[facility] PROGRESS NOTE  Debbie [name] [id number] DOB: 27-Jun-1950 DOA: 04/06/2015 PCP: No PCP Per Patient  HPI/Brief narrative 65 y.o. 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 No need to repeat at this time. -continue to diurese gently given hx renal transplant -follow I/O's -Wt today 75.29 -Wt on admit 75.6kg  Chest pain with hx of CAD -previous admissions for same. Pt not a candidate for cath given CKD/renal transplant per previous cardiology consultations. No evidence of infx etiology with normal WBC, afebrile, no cough -Transfused PRBCs as anemia could be contributing -Trop mildly rising, correlates to anemia, thus consider demand mismatch -continue Imdur, BB, statin  Coagulopathy on chronic Coumadin with hx of Antiphospholipid syndrome/DVT -follow CBC -u/s RLE ordered by ED for swelling to thigh - neg for DVT  Anemia -chronic disease with Iron studies done 01/2014. Baseline Hgb ~9.0  -No evidence of acute blood loss despite INR>10 -Transfused 2 units PRBCs given chest pain on presentation -Would possibly benefit from epo. -Follow serial hgb  Enteroccoccus bacteremia secondary to septic right shoulder -Repeat blood cultures thus far neg -continue ampicillin until 04/26/2015 per ID recommendations  CKD, stage IV -near baseline now. Monitor closely with diuresis -continue home dose bicarb and calcitrol  Hypertension -well-controlled on home BB -Monitor  Hx kidney transplant 2010 -continue chronic immunosuppression  Hx pafib -currently in NSR, rate controlled -INR supratherapeutic  Hx  gout -hold allopurinol while diuresing with hx CKD  Code Status: DNR Family Communication: Pt in room Disposition Plan: Unclear at this time   Consultants:    Procedures:    Antibiotics: Anti-infectives    Start     Dose/Rate Route Frequency Ordered Stop   04/06/15 1515  ampicillin (OMNIPEN) 2 g in sodium chloride 0.9 % 50 mL IVPB     2 g 150 mL/hr over 20 Minutes Intravenous 3 times per day 04/06/15 1505     04/06/15 1230  aztreonam (AZACTAM) 2 g in dextrose 5 % 50 mL IVPB  Status:  Discontinued     2 g 100 mL/hr over 30 Minutes Intravenous  Once 04/06/15 1217 04/06/15 1227      HPI/Subjective: Complains of hip pain while laying in bed  Objective: Filed Vitals:   04/07/15 0723 04/07/15 0800 04/07/15 0900 04/07/15 1100  BP:  144/59 131/61 139/67  Pulse:  79 78 80  Temp: 98.7 F (37.1 C)   98.1 F (36.7 C)  TempSrc: Oral   Oral  Resp:  18 15 17   Height:    5\' 1"  (1.549 m)  Weight:      SpO2:  99% 98% 98%    Intake/Output Summary (Last 24 hours) at 04/07/15 1429 Last data filed at 04/07/15 1000  Gross per 24 hour  Intake 1360.33 ml  Output   1460 ml  Net -99.67 ml   Filed Weights   04/06/15 1339 04/06/15 2135 04/07/15 0452  Weight: 70.534 kg (155 lb 8 oz) 75.6 kg (166 lb 10.7 oz) 75.297 kg (166 lb)    Exam:   General:  Awake, in nad  Cardiovascular: regular, s1, s2  Respiratory: normal resp effort, no wheezing  Abdomen: soft, nondistended  Musculoskeletal: perfused, no clubbing   Data Reviewed: Basic  Metabolic Panel:  Recent Labs Lab 04/06/15 1200 04/07/15 0340  NA 142 140  K 3.8 4.0  CL 102 103  CO2 26 26  GLUCOSE 109* 119*  BUN 29* 31*  CREATININE 2.81* 2.86*  CALCIUM 8.9 8.3*   Liver Function Tests:  Recent Labs Lab 04/06/15 1200  AST 42*  ALT 16  ALKPHOS 65  BILITOT 0.9  PROT 5.0*  ALBUMIN 2.0*   No results for input(s): LIPASE, AMYLASE in the last 168 hours. No results for input(s): AMMONIA in the last 168  hours. CBC:  Recent Labs Lab 04/06/15 1200 04/06/15 1606 04/07/15 0340 04/07/15 1005 04/07/15 1330  WBC 7.8 7.3 8.3  --   --   NEUTROABS  --  4.2  --   --   --   HGB 6.8* 6.6* 9.0* 8.7* 9.2*  HCT 21.6* 21.6* 28.7* 27.5* 27.7*  MCV 86.7 87.4 87.2  --   --   PLT 176 171 157  --   --    Cardiac Enzymes:  Recent Labs Lab 04/06/15 1200 04/06/15 1606 04/06/15 2200 04/07/15 0340  TROPONINI 0.05* 0.11* 0.19* 0.25*   BNP (last 3 results)  Recent Labs  04/06/15 1200  BNP 717.5*    ProBNP (last 3 results) No results for input(s): PROBNP in the last 8760 hours.  CBG: No results for input(s): GLUCAP in the last 168 hours.  Recent Results (from the past 240 hour(s))  Culture, blood (Routine X 2) w Reflex to ID Panel     Status: None (Preliminary result)   Collection Time: 04/06/15 10:00 PM  Result Value Ref Range Status   Specimen Description BLOOD LEFT HAND  Final   Special Requests IN PEDIATRIC BOTTLE 2CC  Final   Culture NO GROWTH < 12 HOURS  Final   Report Status PENDING  Incomplete  Culture, blood (Routine X 2) w Reflex to ID Panel     Status: None (Preliminary result)   Collection Time: 04/06/15 10:00 PM  Result Value Ref Range Status   Specimen Description BLOOD LEFT THUMB  Final   Special Requests BOTTLES DRAWN AEROBIC ONLY 6CC  Final   Culture NO GROWTH < 12 HOURS  Final   Report Status PENDING  Incomplete  MRSA PCR Screening     Status: None   Collection Time: 04/06/15 11:33 PM  Result Value Ref Range Status   MRSA by PCR NEGATIVE NEGATIVE Final    Comment:        The GeneXpert MRSA Assay (FDA approved for NASAL specimens only), is one component of a comprehensive MRSA colonization surveillance program. It is not intended to diagnose MRSA infection nor to guide or monitor treatment for MRSA infections.      Studies: Dg Chest Port 1 View  04/07/2015  CLINICAL DATA:  Congestive heart failure EXAM: PORTABLE CHEST 1 VIEW COMPARISON:  04/06/2015  FINDINGS: Congestive heart failure with pulmonary edema shows mild interval improvement. Small pleural effusions bilaterally. Central venous catheter tip in the SVC unchanged. IMPRESSION: Mild improvement in pulmonary edema. Electronically Signed   By: Franchot Gallo M.D.   On: 04/07/2015 07:28   Dg Chest Port 1 View  04/06/2015  CLINICAL DATA:  Chest pain EXAM: PORTABLE CHEST 1 VIEW COMPARISON:  March 26, 2015 FINDINGS: There is extensive airspace consolidation throughout the lungs bilaterally, more pronounced on the left than on the right. There is underlying interstitial edema. There is mild cardiomegaly with mild pulmonary venous hypertension. Central catheter tip is at the junction of  the left innominate vein and superior vena cava. No pneumothorax. There is arthropathy in both shoulders. IMPRESSION: Widespread airspace consolidation bilaterally, more on the left than on the right, progressed from most recent prior study. Underlying interstitial edema. Suspect widespread pneumonia superimposed on congestive heart failure. Aspiration could present in this manner as well. No pneumothorax evident. Electronically Signed   By: Lowella Grip III M.D.   On: 04/06/2015 12:03    Scheduled Meds: . ampicillin IVPB 2 gram/NS 50 mL  2 g Intravenous 3 times per day  . antiseptic oral rinse  7 mL Mouth Rinse BID  . atorvastatin  40 mg Oral q1800  . calcitRIOL  0.25 mcg Oral QODAY  . [START ON 04/08/2015] calcitRIOL  0.5 mcg Oral QODAY  . carvedilol  6.25 mg Oral BID WC  . famotidine  20 mg Oral Daily  . furosemide  40 mg Intravenous Daily  . isosorbide mononitrate  60 mg Oral Daily  . latanoprost  1 drop Both Eyes QHS  . magnesium oxide  400 mg Oral BID  . methocarbamol  500 mg Oral BID  . mycophenolate  180 mg Oral BID  . predniSONE  5 mg Oral Q breakfast  . ranolazine  500 mg Oral BID  . sodium bicarbonate  1,300 mg Oral BID  . sodium chloride flush  3 mL Intravenous Q12H  . tacrolimus  2 mg Oral  BID  . timolol  1 drop Both Eyes BID   Continuous Infusions:   Active Problems:   CKD (chronic kidney disease), stage IV (HCC)   HTN (hypertension)   Chronic anticoagulation - with Coumadin   Dyslipidemia   Cardiomyopathy, ischemic-40-45%   Supratherapeutic INR   Enterococcal bacteremia   Anemia   Acute congestive heart failure ([place])   [facility], [facility] Pager [phone]. If 7PM-7AM, please contact night-coverage at [email or web address], password [facility] 04/07/2015, 2:29 PM  LOS: 1 day

## 2015-04-07 NOTE — NC FL2 (Signed)
[place] LEVEL OF CARE SCREENING TOOL     IDENTIFICATION  Patient Name: Debbie [name] Birthdate: 1950/09/17 Sex: female Admission Date (Current Location): 04/06/2015  [facility] and [place] Number:  Herbalist and Address:  [facility], [place] [address], [place], [place] [address]      Provider Number:    Attending Physician Name and Address:  Donne Hazel, MD  Relative Name and Phone Number:       Current Level of Care: Hospital Recommended Level of Care: [place] Prior Approval Number:    Date Approved/Denied:   PASRR Number:    Discharge Plan: SNF    Current Diagnoses: Patient Active Problem List   Diagnosis Date Noted  . Anemia 04/06/2015  . Acute congestive heart failure ([place]) 04/06/2015  . Effusion of shoulder joint   . Pyogenic arthritis of right shoulder region ([place])   . Arm pain, right 03/17/2015  . Enterococcal bacteremia   . Bacteremia   . Renal transplant recipient 03/14/2015  . Coagulopathy ([place]) 03/14/2015  . ARF (acute renal failure) ([place]) 03/14/2015  . Acute renal failure superimposed on stage 4 chronic kidney disease ([place]) 03/14/2015  . Cardiomyopathy, ischemic-40-45% 03/14/2015  . Supratherapeutic INR 03/14/2015  . LBBB (left bundle branch block) 03/14/2015  . Anemia of chronic kidney failure 03/14/2015  . Hypokalemia   . Hyperkalemia   . NSTEMI- Jan 2017- medical Rx   . Anemia in CKD (chronic kidney disease)   . Acute on chronic renal failure ([place]) 02/15/2015  . Acute systolic heart failure ([place]) 02/15/2015  . Absolute anemia   . Acute renal failure superimposed on stage 3 chronic kidney disease ([place]) 02/14/2015  . Dehydration 02/14/2015  . Chest pain 02/14/2015  . Pulmonary hypertension ([place]) 09/29/2013  . AKI (acute kidney injury) ([place]) 08/29/2013  . Dyslipidemia 07/22/2013  . Carotid bruit 07/22/2013  . Chronic anticoagulation - with Coumadin 07/06/2013  . Stable angina  ([place]) 06/30/2013  . HTN (hypertension) 06/30/2013  . Diarrhea 06/21/2011  . Neutropenic fever ([place]) 04/17/2011  . Hyponatremia 04/17/2011  . CKD (chronic kidney disease), stage IV ([place]) 04/17/2011  . Kidney transplant as cause of abnormal reaction or later complication     Orientation RESPIRATION BLADDER Height & Weight     Self, Time, Situation, Place  O2 (3L) Continent Weight: 166 lb (75.297 kg) Height:  5\' 1"  (154.9 cm)  BEHAVIORAL SYMPTOMS/MOOD NEUROLOGICAL BOWEL NUTRITION STATUS  Other (Comment) (no other behaviors)   Continent    AMBULATORY STATUS COMMUNICATION OF NEEDS Skin   Extensive Assist Verbally PU Stage and Appropriate Care (Consult requested for buttocks.)                       Personal Care Assistance Level of Assistance  Bathing, Feeding, Dressing Bathing Assistance: Limited assistance Feeding assistance: Limited assistance Dressing Assistance: Limited assistance     Functional Limitations Info  Sight, Hearing, Speech Sight Info: Adequate Hearing Info: Adequate Speech Info: Adequate    SPECIAL CARE FACTORS FREQUENCY  PT (By licensed PT), OT (By licensed OT)                    Contractures Contractures Info: Not present    Additional Factors Info  Psychotropic, Insulin Sliding Scale, Isolation Precautions, Code Status, Allergies Code Status Info: DNR Allergies Info: Feraheme, Vancomycin, Dorzolamide Hcl-timolol Mal, Gentamycin, Latanoprost, Cefazolin, Codeine, Hydrocodone-acetaminophen, Penicillins Psychotropic Info: none Insulin Sliding Scale Info: none  Isolation Precautions Info: none     Current Medications (04/07/2015):  This is the current hospital active medication list Current Facility-Administered Medications  Medication Dose Route Frequency Provider Last Rate Last Dose  . 0.9 %  sodium chloride infusion  250 mL Intravenous PRN Dionne Milo, NP      . acetaminophen (TYLENOL) tablet 650 mg  650 mg Oral Q4H PRN Dionne Milo, NP      . ampicillin (OMNIPEN) 2 g in sodium chloride 0.9 % 50 mL IVPB  2 g Intravenous 3 times per day Dionne Milo, NP 150 mL/hr at 04/07/15 0540 2 g at 04/07/15 0540  . antiseptic oral rinse (CPC / CETYLPYRIDINIUM CHLORIDE 0.05%) solution 7 mL  7 mL Mouth Rinse BID Josue Hector, MD   7 mL at 04/07/15 1000  . atorvastatin (LIPITOR) tablet 40 mg  40 mg Oral q1800 Dionne Milo, NP   40 mg at 04/06/15 1819  . calcitRIOL (ROCALTROL) capsule 0.25 mcg  0.25 mcg Oral QODAY Waldemar Dickens, MD   0.25 mcg at 04/07/15 0920  . [START ON 04/08/2015] calcitRIOL (ROCALTROL) capsule 0.5 mcg  0.5 mcg Oral QODAY Waldemar Dickens, MD      . carvedilol (COREG) tablet 6.25 mg  6.25 mg Oral BID WC Dionne Milo, NP   6.25 mg at 04/07/15 0825  . famotidine (PEPCID) tablet 20 mg  20 mg Oral Daily Dionne Milo, NP   20 mg at 04/07/15 [id number]  . furosemide (LASIX) injection 40 mg  40 mg Intravenous Daily Dionne Milo, NP   40 mg at 04/07/15 [id number]  . isosorbide mononitrate (IMDUR) 24 hr tablet 60 mg  60 mg Oral Daily Dionne Milo, NP   60 mg at 04/07/15 0920  . latanoprost (XALATAN) 0.005 % ophthalmic solution 1 drop  1 drop Both Eyes QHS Dionne Milo, NP   1 drop at 04/06/15 2254  . magnesium oxide (MAG-OX) tablet 400 mg  400 mg Oral BID Dionne Milo, NP   400 mg at 04/07/15 0920  . methocarbamol (ROBAXIN) tablet 500 mg  500 mg Oral BID Dionne Milo, NP   500 mg at 04/07/15 [id number]  . mycophenolate (MYFORTIC) EC tablet 180 mg  180 mg Oral BID Waldemar Dickens, MD   180 mg at 04/07/15 0920  . nitroGLYCERIN (NITROSTAT) SL tablet 0.4 mg  0.4 mg Sublingual Q5 min PRN Dionne Milo, NP      . ondansetron [facility]) injection 4 mg  4 mg Intravenous Q6H PRN Dionne Milo, NP      . oxyCODONE-acetaminophen (PERCOCET/ROXICET) 5-325 MG per tablet 0.5-1 tablet  0.5-1 tablet Oral Q6H PRN Dionne Milo, NP   1 tablet at 04/07/15 0453  . predniSONE (DELTASONE) tablet  5 mg  5 mg Oral Q breakfast Dionne Milo, NP   5 mg at 04/07/15 0825  . ranolazine (RANEXA) 12 hr tablet 500 mg  500 mg Oral BID Waldemar Dickens, MD   500 mg at 04/07/15 0920  . sodium bicarbonate tablet 1,300 mg  1,300 mg Oral BID Dionne Milo, NP   1,300 mg at 04/07/15 0920  . sodium chloride flush (NS) 0.9 % injection 3 mL  3 mL Intravenous Q12H Dionne Milo, NP   3 mL at 04/07/15 0928  . sodium chloride flush (NS) 0.9 % injection 3 mL  3 mL Intravenous PRN Dionne Milo, NP      .  tacrolimus (PROGRAF) capsule 2 mg  2 mg Oral BID Waldemar Dickens, MD   2 mg at 04/07/15 0920  . timolol (TIMOPTIC) 0.5 % ophthalmic solution 1 drop  1 drop Both Eyes BID Dionne Milo, NP   1 drop at 04/07/15 [id number]     Discharge Medications: Please see discharge summary for a list of discharge medications.  Relevant Imaging Results:  Relevant Lab Results:   Additional Information    Lilly [place], LCSW

## 2015-04-07 NOTE — Progress Notes (Signed)
[place] for warfarin Indication: antiphospholipid antibody syndrome, history of DVT, afib  Allergies  Allergen Reactions  . Feraheme [Ferumoxytol] Other (See Comments)    Chest pain, pulsating 02/17/15: tolerated Nulecit  . Vancomycin Anaphylaxis, Itching, Swelling and Other (See Comments)    Tongue swell  . Dorzolamide Hcl-Timolol Mal Rash and Other (See Comments)    Eye pain  . Gentamycin [Gentamicin Sulfate] Itching and Swelling  . Latanoprost Rash and Other (See Comments)    Eye pain  . Cefazolin Itching  . Codeine Itching  . Hydrocodone-Acetaminophen Itching  . Penicillins Itching    *tolerated ampicillin during 02/2015 admission Has patient had a PCN reaction causing immediate rash, facial/tongue/throat swelling, SOB or lightheadedness with hypotension:No Has patient had a PCN reaction causing severe rash involving mucus membranes or skin necrosis:No Has patient had a PCN reaction that required hospitalization:No Has patient had a PCN reaction occurring within the last 10 years:No If all of the above answers are "NO", then may proceed with Cephalosporin use.     Patient Measurements: Weight: 166 lb (75.297 kg)  Vital Signs: Temp: 98 F (36.7 C) (03/16 0452) Temp Source: Oral (03/16 0452) BP: 140/62 mmHg (03/16 0700) Pulse Rate: 80 (03/16 0700)  Labs:  Recent Labs  04/06/15 1200 04/06/15 1606 04/06/15 2200 04/07/15 0340  HGB 6.8* 6.6*  --  9.0*  HCT 21.6* 21.6*  --  28.7*  PLT 176 171  --  157  LABPROT 76.6* 80.7*  --  64.5*  INR >10.00* >10.00*  --  8.08*  CREATININE 2.81*  --   --  2.86*  TROPONINI 0.05* 0.11* 0.19* 0.25*    Estimated Creatinine Clearance: 18.2 mL/min (by C-G formula based on Cr of 2.86).  Assessment: 15 yof on warfarin pta for antiphospholipid antibody syndrome. INR>10 on admit with no active bleeding. Has been on IV ampicillin outpatient per EDP note.   Hgb up to 9 after 2 units, INR down to  8.0 after 5mg  of vitamin k (po). No bleeding issues noted overnight. 3 wounds/skin tears noted on buttocks.  Dopplers negative for DVT  Goal of Therapy:  INR 2-3 Monitor platelets by anticoagulation protocol: Yes   Plan:  Continue to Hold warfarin tonight Daily INR, monitor s/sx bleeding  Erin Hearing PharmD., BCPS Clinical Pharmacist Pager [phone] 04/07/2015 7:17 AM

## 2015-04-07 NOTE — Progress Notes (Signed)
Offered to do  the dressing change of the central line since it is due, claimed to have it done tonight , she wants some rest.Explained  the needs to change the dressing to prevent infection, verbalized  understanding. Continue to monitor.

## 2015-04-07 NOTE — Clinical Social Work Note (Signed)
CSW received referral that patient is from [facility].  CSW spoke with patient and she said she would like to return back to SNF once she is medically ready for discharge so she can continue with her therapy.  Patient was alert and oriented x4 and able to express herself.  CSW to continue to follow patient's progress, formal assessment to follow.  Jones Broom. [place], MSW, [place] 04/07/2015 4:54 PM

## 2015-04-07 NOTE — Progress Notes (Signed)
Troponin .25 this am.   INR 8.08 this am.  MD made aware.

## 2015-04-07 NOTE — NC FL2 (Signed)
[place] LEVEL OF CARE SCREENING TOOL     IDENTIFICATION  Patient Name: Debbie [name] Birthdate: Dec 30, 1950 Sex: female Admission Date (Current Location): 04/06/2015  [facility] and [place] Number:  Herbalist and Address:  [facility] [place]. [facility], [place] [address], [place], [place] [address]      Provider Number:    Attending Physician Name and Address:  Donne Hazel, MD  Relative Name and Phone Number:       Current Level of Care: Hospital Recommended Level of Care: [place] Prior Approval Number:    Date Approved/Denied:   PASRR Number:    Discharge Plan: SNF    Current Diagnoses: Patient Active Problem List   Diagnosis Date Noted  . Anemia 04/06/2015  . Acute congestive heart failure ([place]) 04/06/2015  . Effusion of shoulder joint   . Pyogenic arthritis of right shoulder region ([place])   . Arm pain, right 03/17/2015  . Enterococcal bacteremia   . Bacteremia   . Renal transplant recipient 03/14/2015  . Coagulopathy ([place]) 03/14/2015  . ARF (acute renal failure) ([place]) 03/14/2015  . Acute renal failure superimposed on stage 4 chronic kidney disease ([place]) 03/14/2015  . Cardiomyopathy, ischemic-40-45% 03/14/2015  . Supratherapeutic INR 03/14/2015  . LBBB (left bundle branch block) 03/14/2015  . Anemia of chronic kidney failure 03/14/2015  . Hypokalemia   . Hyperkalemia   . NSTEMI- Jan 2017- medical Rx   . Anemia in CKD (chronic kidney disease)   . Acute on chronic renal failure ([place]) 02/15/2015  . Acute systolic heart failure ([place]) 02/15/2015  . Absolute anemia   . Acute renal failure superimposed on stage 3 chronic kidney disease ([place]) 02/14/2015  . Dehydration 02/14/2015  . Chest pain 02/14/2015  . Pulmonary hypertension ([place]) 09/29/2013  . AKI (acute kidney injury) ([place]) 08/29/2013  . Dyslipidemia 07/22/2013  . Carotid bruit 07/22/2013  . Chronic anticoagulation - with Coumadin 07/06/2013  . Stable angina  ([place]) 06/30/2013  . HTN (hypertension) 06/30/2013  . Diarrhea 06/21/2011  . Neutropenic fever ([place]) 04/17/2011  . Hyponatremia 04/17/2011  . CKD (chronic kidney disease), stage IV ([place]) 04/17/2011  . Kidney transplant as cause of abnormal reaction or later complication     Orientation RESPIRATION BLADDER Height & Weight     Self, Time, Situation, Place  O2 (3L) Continent Weight: 166 lb (75.297 kg) Height:  5\' 1"  (154.9 cm)  BEHAVIORAL SYMPTOMS/MOOD NEUROLOGICAL BOWEL NUTRITION STATUS  Other (Comment) (no other behaviors)   Continent    AMBULATORY STATUS COMMUNICATION OF NEEDS Skin   Extensive Assist Verbally PU Stage and Appropriate Care (Consult requested for buttocks.)                       Personal Care Assistance Level of Assistance  Bathing, Feeding, Dressing Bathing Assistance: Limited assistance Feeding assistance: Limited assistance Dressing Assistance: Limited assistance     Functional Limitations Info  Sight, Hearing, Speech Sight Info: Adequate Hearing Info: Adequate Speech Info: Adequate    SPECIAL CARE FACTORS FREQUENCY  PT (By licensed PT), OT (By licensed OT)                    Contractures Contractures Info: Not present    Additional Factors Info  Psychotropic, Insulin Sliding Scale, Isolation Precautions, Code Status, Allergies Code Status Info: DNR Allergies Info: Feraheme, Vancomycin, Dorzolamide Hcl-timolol Mal, Gentamycin, Latanoprost, Cefazolin, Codeine, Hydrocodone-acetaminophen, Penicillins Psychotropic Info: none Insulin Sliding Scale Info: none  Isolation Precautions Info: none     Current Medications (04/07/2015):  This is the current hospital active medication list Current Facility-Administered Medications  Medication Dose Route Frequency Provider Last Rate Last Dose  . 0.9 %  sodium chloride infusion  250 mL Intravenous PRN Dionne Milo, NP      . acetaminophen (TYLENOL) tablet 650 mg  650 mg Oral Q4H PRN Dionne Milo, NP      . ampicillin (OMNIPEN) 2 g in sodium chloride 0.9 % 50 mL IVPB  2 g Intravenous 3 times per day Dionne Milo, NP 150 mL/hr at 04/07/15 0540 2 g at 04/07/15 0540  . antiseptic oral rinse (CPC / CETYLPYRIDINIUM CHLORIDE 0.05%) solution 7 mL  7 mL Mouth Rinse BID Josue Hector, MD   7 mL at 04/07/15 1000  . atorvastatin (LIPITOR) tablet 40 mg  40 mg Oral q1800 Dionne Milo, NP   40 mg at 04/06/15 1819  . calcitRIOL (ROCALTROL) capsule 0.25 mcg  0.25 mcg Oral QODAY Waldemar Dickens, MD   0.25 mcg at 04/07/15 0920  . [START ON 04/08/2015] calcitRIOL (ROCALTROL) capsule 0.5 mcg  0.5 mcg Oral QODAY Waldemar Dickens, MD      . carvedilol (COREG) tablet 6.25 mg  6.25 mg Oral BID WC Dionne Milo, NP   6.25 mg at 04/07/15 0825  . famotidine (PEPCID) tablet 20 mg  20 mg Oral Daily Dionne Milo, NP   20 mg at 04/07/15 [id number]  . furosemide (LASIX) injection 40 mg  40 mg Intravenous Daily Dionne Milo, NP   40 mg at 04/07/15 [id number]  . isosorbide mononitrate (IMDUR) 24 hr tablet 60 mg  60 mg Oral Daily Dionne Milo, NP   60 mg at 04/07/15 0920  . latanoprost (XALATAN) 0.005 % ophthalmic solution 1 drop  1 drop Both Eyes QHS Dionne Milo, NP   1 drop at 04/06/15 2254  . magnesium oxide (MAG-OX) tablet 400 mg  400 mg Oral BID Dionne Milo, NP   400 mg at 04/07/15 0920  . methocarbamol (ROBAXIN) tablet 500 mg  500 mg Oral BID Dionne Milo, NP   500 mg at 04/07/15 [id number]  . mycophenolate (MYFORTIC) EC tablet 180 mg  180 mg Oral BID Waldemar Dickens, MD   180 mg at 04/07/15 0920  . nitroGLYCERIN (NITROSTAT) SL tablet 0.4 mg  0.4 mg Sublingual Q5 min PRN Dionne Milo, NP      . ondansetron [facility]) injection 4 mg  4 mg Intravenous Q6H PRN Dionne Milo, NP      . oxyCODONE-acetaminophen (PERCOCET/ROXICET) 5-325 MG per tablet 0.5-1 tablet  0.5-1 tablet Oral Q6H PRN Dionne Milo, NP   1 tablet at 04/07/15 0453  . predniSONE (DELTASONE) tablet  5 mg  5 mg Oral Q breakfast Dionne Milo, NP   5 mg at 04/07/15 0825  . ranolazine (RANEXA) 12 hr tablet 500 mg  500 mg Oral BID Waldemar Dickens, MD   500 mg at 04/07/15 0920  . sodium bicarbonate tablet 1,300 mg  1,300 mg Oral BID Dionne Milo, NP   1,300 mg at 04/07/15 0920  . sodium chloride flush (NS) 0.9 % injection 3 mL  3 mL Intravenous Q12H Dionne Milo, NP   3 mL at 04/07/15 0928  . sodium chloride flush (NS) 0.9 % injection 3 mL  3 mL Intravenous PRN Dionne Milo, NP      .  tacrolimus (PROGRAF) capsule 2 mg  2 mg Oral BID Waldemar Dickens, MD   2 mg at 04/07/15 0920  . timolol (TIMOPTIC) 0.5 % ophthalmic solution 1 drop  1 drop Both Eyes BID Dionne Milo, NP   1 drop at 04/07/15 [id number]     Discharge Medications: Please see discharge summary for a list of discharge medications.  Relevant Imaging Results:  Relevant Lab Results:   Additional Information    Lilly [place], LCSW

## 2015-04-07 NOTE — Consult Note (Addendum)
WOC wound consult note Reason for Consult: Consult requested for buttocks.  Pt states she had false nails applied prior to admission, then her skin became very dry and she scratched it with the nails to her buttocks and it became very irritated.  This location declined when she was frequently incontinent of urine. She currently has a foley to contain urine. Pt INR is elevated and the sites of partial thickness skin loss began to bleed and have some clotted blood, according to the progress notes.   Wound type: Appearance consistent with moisture associated skin damage and partial thickness skin loss in patchy areas across bila buttocks/sacrum; affected area approx 8X5X.1cm.  Pressure Ulcer POA: This was present on admission and is NOT a pressure injury Wound bed: Open areas are red and moist wound beds with small amt blood-tinged drainage, no odor. Dressing procedure/placement/frequency: Air mattress to decrease pressure and increase airflow to the affected area. Wounds are high risk to evolve into slough or eschar if the bleeding does not decrease. Discussed plan of care with patient and she verbalized understanding. Please re-consult if further assistance is needed.  Thank-you,  Julien Girt MSN, [place], [place], [place], [place]

## 2015-04-07 NOTE — Care Management Note (Signed)
Case Management Note  Patient Details  Name: MIKENZI SHAWVER MRN: [id number] Date of Birth: July 14, 1950  Subjective/Objective:               Adm w chf, anemia     Action/Plan: from [facility] per chart. sw ref   Expected Discharge Date:                  Expected Discharge Plan:  [place]  In-House Referral:  Clinical Social Work  Discharge planning Services  CM Consult  Post Acute Care Choice:    Choice offered to:  Patient  DME Arranged:    DME Agency:     HH Arranged:    [place] Agency:     Status of Service:  In process, will continue to follow  Medicare Important Message Given:    Date Medicare IM Given:    Medicare IM give by:    Date Additional Medicare IM Given:    Additional Medicare Important Message give by:     If discussed at [place] of Stay Meetings, dates discussed:    Additional Comments:ur review done  Lacretia Leigh, RN 04/07/2015, 9:48 AM

## 2015-04-08 ENCOUNTER — Inpatient Hospital Stay (HOSPITAL_COMMUNITY): Payer: Medicare Other

## 2015-04-08 LAB — PREPARE RBC (CROSSMATCH)

## 2015-04-08 LAB — BASIC METABOLIC PANEL
Anion gap: 8 (ref 5–15)
BUN: 32 mg/dL — AB (ref 6–20)
CHLORIDE: 104 mmol/L (ref 101–111)
CO2: 28 mmol/L (ref 22–32)
Calcium: 8.6 mg/dL — ABNORMAL LOW (ref 8.9–10.3)
Creatinine, Ser: 2.9 mg/dL — ABNORMAL HIGH (ref 0.44–1.00)
GFR calc Af Amer: 18 mL/min — ABNORMAL LOW (ref >60)
GFR calc non Af Amer: 16 mL/min — ABNORMAL LOW (ref >60)
GLUCOSE: 114 mg/dL — AB (ref 65–99)
POTASSIUM: 4.1 mmol/L (ref 3.5–5.1)
Sodium: 140 mmol/L (ref 135–145)

## 2015-04-08 LAB — CBC
HEMATOCRIT: 26.9 % — AB (ref 36.0–46.0)
HEMOGLOBIN: 8.4 g/dL — AB (ref 12.0–15.0)
MCH: 27.4 pg (ref 26.0–34.0)
MCHC: 31.2 g/dL (ref 30.0–36.0)
MCV: 87.6 fL (ref 78.0–100.0)
Platelets: 150 10*3/uL (ref 150–400)
RBC: 3.07 MIL/uL — AB (ref 3.87–5.11)
RDW: 17.3 % — AB (ref 11.5–15.5)
WBC: 7.2 10*3/uL (ref 4.0–10.5)

## 2015-04-08 LAB — PROTIME-INR
INR: 2.91 — AB (ref 0.00–1.49)
PROTHROMBIN TIME: 29.9 s — AB (ref 11.6–15.2)

## 2015-04-08 LAB — TROPONIN I: TROPONIN I: 0.17 ng/mL — AB (ref <0.031)

## 2015-04-08 LAB — PROCALCITONIN: Procalcitonin: 0.19 ng/mL

## 2015-04-08 MED ORDER — MORPHINE SULFATE (PF) 2 MG/ML IV SOLN
2.0000 mg | INTRAVENOUS | Status: DC | PRN
  Administered 2015-04-08: 2 mg via INTRAVENOUS
  Filled 2015-04-08: qty 1

## 2015-04-08 MED ORDER — WARFARIN SODIUM 2.5 MG PO TABS
2.5000 mg | ORAL_TABLET | Freq: Once | ORAL | Status: AC
  Administered 2015-04-08: 2.5 mg via ORAL
  Filled 2015-04-08: qty 1

## 2015-04-08 MED ORDER — SODIUM CHLORIDE 0.9 % IV SOLN
Freq: Once | INTRAVENOUS | Status: AC
  Administered 2015-04-08: 12:00:00 via INTRAVENOUS

## 2015-04-08 MED ORDER — FUROSEMIDE 10 MG/ML IJ SOLN
20.0000 mg | Freq: Once | INTRAMUSCULAR | Status: AC
  Administered 2015-04-08: 20 mg via INTRAVENOUS
  Filled 2015-04-08: qty 2

## 2015-04-08 MED ORDER — HYDROMORPHONE HCL 1 MG/ML IJ SOLN
1.0000 mg | INTRAMUSCULAR | Status: DC | PRN
  Administered 2015-04-08 – 2015-04-10 (×5): 1 mg via INTRAVENOUS
  Filled 2015-04-08 (×5): qty 1

## 2015-04-08 MED ORDER — WARFARIN - PHARMACIST DOSING INPATIENT
Freq: Every day | Status: DC
  Administered 2015-04-12: 18:00:00

## 2015-04-08 NOTE — Progress Notes (Signed)
[facility] PROGRESS NOTE  Debbie [name] [id number] DOB: [birth year] DOA: 04/06/2015 PCP: No PCP Per Patient  HPI/Brief narrative 65 y.o. 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 No need to repeat at this time. -continue to diurese gently given hx renal transplant -follow I/O's -Wt today 74.9 -Wt on admit 75.6kg  Chest pain with hx of CAD -previous admissions for same. Pt not a candidate for cath given CKD/renal transplant per previous cardiology consultations. No evidence of infx etiology with normal WBC, afebrile, no cough -Trop initially rising, correlated to presenting anemia, suspect demand mismatch -continue Imdur, BB, statin -Pt with complaints of chest pains this AM. Will give another 2 units of PRBC's with goal to keep hgb over 10  Coagulopathy on chronic Coumadin with hx of Antiphospholipid syndrome/DVT -follow CBC -u/s RLE ordered by ED for swelling to thigh - neg for DVT  Anemia -chronic disease with Iron studies done 01/2014. Baseline Hgb ~9.0  -No evidence of acute blood loss despite INR>10 -Transfused 2 units PRBCs given chest pain on presentation -Would possibly benefit from epo. -Hgb stable at around 8.5 with chest pains this AM. Will give another 2 units of PRBC's  Enteroccoccus bacteremia secondary to septic right shoulder -Repeat blood cultures thus far neg -continue ampicillin until 04/26/2015 per ID recommendations  CKD, stage IV -near baseline now. Monitor closely with diuresis -continue home dose bicarb and calcitrol  Hypertension -well-controlled on home  BB -Monitor  Hx kidney transplant 2010 -continue chronic immunosuppression  Hx pafib -currently in NSR, rate controlled -INR presented supratherapeutic -INR trending toward therapeutic range  Hx gout -hold allopurinol while diuresing with hx CKD  Code Status: DNR Family Communication: Pt in room Disposition Plan: Unclear at this time   Consultants:    Procedures:    Antibiotics: Anti-infectives    Start     Dose/Rate Route Frequency Ordered Stop   04/06/15 1515  ampicillin (OMNIPEN) 2 g in sodium chloride 0.9 % 50 mL IVPB     2 g 150 mL/hr over 20 Minutes Intravenous 3 times per day 04/06/15 1505     04/06/15 1230  aztreonam (AZACTAM) 2 g in dextrose 5 % 50 mL IVPB  Status:  Discontinued     2 g 100 mL/hr over 30 Minutes Intravenous  Once 04/06/15 1217 04/06/15 1227      HPI/Subjective: Reports mild chest pain this AM  Objective: Filed Vitals:   04/08/15 1000 04/08/15 1140 04/08/15 1200 04/08/15 1210  BP: 134/58 122/56 134/63 132/55  Pulse: 85  84 83  Temp:  98.5 F (36.9 C)  98.5 F (36.9 C)  TempSrc:  Oral  Oral  Resp: 25  20 17   Height:      Weight:      SpO2: 92%  97% 97%    Intake/Output Summary (Last 24 hours) at 04/08/15 1405 Last data filed at 04/08/15 1338  Gross per 24 hour  Intake    700 ml  Output    955 ml  Net   -255 ml   Filed Weights   04/06/15 2135 04/07/15 0452 04/08/15 0400  Weight: 75.6 kg (166 lb 10.7 oz) 75.297 kg (166 lb) 74.9 kg (165 lb 2  oz)    Exam:   General:  Awake, in nad  Cardiovascular: regular, s1, s2  Respiratory: normal resp effort, no wheezing  Abdomen: soft, nondistended, pos BS  Musculoskeletal: perfused, no clubbing   Data Reviewed: Basic Metabolic Panel:  Recent Labs Lab 04/06/15 1200 04/07/15 0340 04/08/15 0510  NA 142 140 140  K 3.8 4.0 4.1  CL 102 103 104  CO2 26 26 28   GLUCOSE 109* 119* 114*  BUN 29* 31* 32*  CREATININE 2.81* 2.86* 2.90*  CALCIUM 8.9 8.3* 8.6*   Liver Function  Tests:  Recent Labs Lab 04/06/15 1200  AST 42*  ALT 16  ALKPHOS 65  BILITOT 0.9  PROT 5.0*  ALBUMIN 2.0*   No results for input(s): LIPASE, AMYLASE in the last 168 hours. No results for input(s): AMMONIA in the last 168 hours. CBC:  Recent Labs Lab 04/06/15 1200 04/06/15 1606 04/07/15 0340 04/07/15 1005 04/07/15 1330 04/07/15 1831 04/08/15 0510  WBC 7.8 7.3 8.3  --   --   --  7.2  NEUTROABS  --  4.2  --   --   --   --   --   HGB 6.8* 6.6* 9.0* 8.7* 9.2* 8.5* 8.4*  HCT 21.6* 21.6* 28.7* 27.5* 27.7* 27.3* 26.9*  MCV 86.7 87.4 87.2  --   --   --  87.6  PLT 176 171 157  --   --   --  150   Cardiac Enzymes:  Recent Labs Lab 04/06/15 2200 04/07/15 0340 04/07/15 1827 04/07/15 2235 04/08/15 0510  TROPONINI 0.19* 0.25* 0.22* 0.20* 0.17*   BNP (last 3 results)  Recent Labs  04/06/15 1200  BNP 717.5*    ProBNP (last 3 results) No results for input(s): PROBNP in the last 8760 hours.  CBG: No results for input(s): GLUCAP in the last 168 hours.  Recent Results (from the past 240 hour(s))  Culture, blood (Routine X 2) w Reflex to ID Panel     Status: None (Preliminary result)   Collection Time: 04/06/15 10:00 PM  Result Value Ref Range Status   Specimen Description BLOOD LEFT HAND  Final   Special Requests IN PEDIATRIC BOTTLE 2CC  Final   Culture NO GROWTH < 12 HOURS  Final   Report Status PENDING  Incomplete  Culture, blood (Routine X 2) w Reflex to ID Panel     Status: None (Preliminary result)   Collection Time: 04/06/15 10:00 PM  Result Value Ref Range Status   Specimen Description BLOOD LEFT THUMB  Final   Special Requests BOTTLES DRAWN AEROBIC ONLY 6CC  Final   Culture NO GROWTH < 12 HOURS  Final   Report Status PENDING  Incomplete  MRSA PCR Screening     Status: None   Collection Time: 04/06/15 11:33 PM  Result Value Ref Range Status   MRSA by PCR NEGATIVE NEGATIVE Final    Comment:        The GeneXpert MRSA Assay (FDA approved for NASAL  specimens only), is one component of a comprehensive MRSA colonization surveillance program. It is not intended to diagnose MRSA infection nor to guide or monitor treatment for MRSA infections.      Studies: Dg Chest Port 1 View  04/07/2015  CLINICAL DATA:  Congestive heart failure EXAM: PORTABLE CHEST 1 VIEW COMPARISON:  04/06/2015 FINDINGS: Congestive heart failure with pulmonary edema shows mild interval improvement. Small pleural effusions bilaterally. Central venous catheter tip in the SVC unchanged. IMPRESSION: Mild improvement in pulmonary edema. Electronically  Signed   By: Franchot Gallo M.D.   On: 04/07/2015 07:28    Scheduled Meds: . ampicillin IVPB 2 gram/NS 50 mL  2 g Intravenous 3 times per day  . antiseptic oral rinse  7 mL Mouth Rinse BID  . atorvastatin  40 mg Oral q1800  . calcitRIOL  0.25 mcg Oral QODAY  . calcitRIOL  0.5 mcg Oral QODAY  . carvedilol  6.25 mg Oral BID WC  . famotidine  20 mg Oral Daily  . furosemide  20 mg Intravenous Once  . furosemide  40 mg Intravenous Daily  . isosorbide mononitrate  60 mg Oral Daily  . latanoprost  1 drop Both Eyes QHS  . magnesium oxide  400 mg Oral BID  . methocarbamol  500 mg Oral BID  . mycophenolate  180 mg Oral BID  . predniSONE  5 mg Oral Q breakfast  . ranolazine  500 mg Oral BID  . sodium bicarbonate  1,300 mg Oral BID  . sodium chloride flush  3 mL Intravenous Q12H  . tacrolimus  2 mg Oral BID  . timolol  1 drop Both Eyes BID  . warfarin  2.5 mg Oral ONCE-1800  . Warfarin - Pharmacist Dosing Inpatient   Does not apply q1800   Continuous Infusions:   Active Problems:   CKD (chronic kidney disease), stage IV (HCC)   HTN (hypertension)   Chronic anticoagulation - with Coumadin   Dyslipidemia   Cardiomyopathy, ischemic-40-45%   Supratherapeutic INR   Enterococcal bacteremia   Anemia   Acute congestive heart failure ([place])   [facility], [place] [facility] Pager [phone]. If 7PM-7AM, please  contact night-coverage at [email or web address], password [facility] 04/08/2015, 2:05 PM  LOS: 2 days

## 2015-04-08 NOTE — Care Management Important Message (Signed)
Important Message  Patient Details  Name: Debbie [name] MRN: [id number] Date of Birth: Oct 24, 1950   Medicare Important Message Given:  Yes    Ethal Gotay P Adrielle Polakowski 04/08/2015, 2:18 PM

## 2015-04-08 NOTE — Progress Notes (Signed)
[place] for warfarin Indication: antiphospholipid antibody syndrome, history of DVT, afib  Allergies  Allergen Reactions  . Feraheme [Ferumoxytol] Other (See Comments)    Chest pain, pulsating 02/17/15: tolerated Nulecit  . Vancomycin Anaphylaxis, Itching, Swelling and Other (See Comments)    Tongue swell  . Dorzolamide Hcl-Timolol Mal Rash and Other (See Comments)    Eye pain  . Gentamycin [Gentamicin Sulfate] Itching and Swelling  . Latanoprost Rash and Other (See Comments)    Eye pain  . Cefazolin Itching  . Codeine Itching  . Hydrocodone-Acetaminophen Itching  . Penicillins Itching    *tolerated ampicillin during 02/2015 admission Has patient had a PCN reaction causing immediate rash, facial/tongue/throat swelling, SOB or lightheadedness with hypotension:No Has patient had a PCN reaction causing severe rash involving mucus membranes or skin necrosis:No Has patient had a PCN reaction that required hospitalization:No Has patient had a PCN reaction occurring within the last 10 years:No If all of the above answers are "NO", then may proceed with Cephalosporin use.     Patient Measurements: Height: 5\' 1"  (154.9 cm) Weight: 165 lb 2 oz (74.9 kg) IBW/kg (Calculated) : 47.8  Vital Signs: Temp: 98.5 F (36.9 C) (03/17 1210) Temp Source: Oral (03/17 1210) BP: 132/55 mmHg (03/17 1210) Pulse Rate: 83 (03/17 1210)  Labs:  Recent Labs  04/06/15 1200 04/06/15 1606  04/07/15 0340  04/07/15 1330 04/07/15 1827 04/07/15 1831 04/07/15 2235 04/08/15 0510 04/08/15 1130  HGB 6.8* 6.6*  --  9.0*  < > 9.2*  --  8.5*  --  8.4*  --   HCT 21.6* 21.6*  --  28.7*  < > 27.7*  --  27.3*  --  26.9*  --   PLT 176 171  --  157  --   --   --   --   --  150  --   LABPROT 76.6* 80.7*  --  64.5*  --   --   --   --   --   --  29.9*  INR >10.00* >10.00*  --  8.08*  --   --   --   --   --   --  2.91*  CREATININE 2.81*  --   --  2.86*  --   --   --   --   --   2.90*  --   TROPONINI 0.05* 0.11*  < > 0.25*  --   --  0.22*  --  0.20* 0.17*  --   < > = values in this interval not displayed.  Estimated Creatinine Clearance: 17.9 mL/min (by C-G formula based on Cr of 2.9).  Assessment: 60 yof on warfarin pta for antiphospholipid antibody syndrome. INR>10 on admit with no active bleeding. Has been on IV ampicillin outpatient per EDP note.   Hgb up to 9 after 2 units yesterday now back down to 8.4, INR down to 2.9 after 5mg  of vitamin k (po). No bleeding issues noted overnight. 3 wounds/skin tears noted on buttocks.  Dopplers negative for DVT  Goal of Therapy:  INR 2-3 Monitor platelets by anticoagulation protocol: Yes   Plan:  Warfarin 2.5mg  tonight Daily INR, monitor s/sx bleeding  Erin Hearing PharmD., BCPS Clinical Pharmacist Pager [phone] 04/08/2015 1:27 PM

## 2015-04-08 NOTE — Clinical Social Work Note (Signed)
Clinical Social Work Assessment  Patient Details  Name: Debbie [name] MRN: [id number] Date of Birth: February 04, 1950  Date of referral:  04/07/15               Reason for consult:  Facility Placement                Permission sought to share information with:  Facility Sport and exercise psychologist Permission granted to share information::  Yes, Verbal Permission Granted  Name::     [name] and [name] [phone]  Agency::     Relationship::  Daughters  Contact Information:  [phone] Flatten) [phone] Tamala Julian)  Housing/Transportation Living arrangements for the past 2 months:  [place], [place] of Information:  Patient Patient Interpreter Needed:  None Criminal Activity/Legal Involvement Pertinent to Current Situation/Hospitalization:  No - Comment as needed Significant Relationships:  Adult Children Lives with:  Self Do you feel safe going back to the place where you live?  Yes Need for family participation in patient care:  No (Coment)  Care giving concerns:  Patient did not express any concerns with returning back to SNF.   Social Worker assessment / plan:  Patient is a 65 year old female who was at [facility] for short term rehab SNF.  Patient is alert and oriented x4 and expresses that she does not have any issues with returning back to SNF.  Patient stated her therapy has been going well and she is looking forward to returning back to [facility] to continue with her therapy.  Patient expressed that she hopes to return as soon as she is medically ready to go.  Patient did not have any other questions or concerns.    Employment status:  Retired Forensic scientist:  Medicare PT Recommendations:  [place] / Referral to community resources:  [place]  Patient/Family's Response to care: Patient in agreement to returning back to SNF. Patient/Family's Understanding of and Emotional Response to  Diagnosis, Current Treatment, and Prognosis:  Patient is aware of diagnosis and current treatment plan.  Emotional Assessment Appearance:  Appears stated age Attitude/Demeanor/Rapport:    Affect (typically observed):  Appropriate, Pleasant, Calm, Stable Orientation:  Oriented to Self, Oriented to Place, Oriented to Situation, Oriented to  Time Alcohol / Substance use:  Tobacco Use Psych involvement (Current and /or in the community):  No (Comment)  Discharge Needs  Concerns to be addressed:  No discharge needs identified Readmission within the last 30 days:  Yes (03/28/15 to [facility]) Current discharge risk:  None Barriers to Discharge:  No Barriers Identified   Ross Ludwig, LCSWA 04/08/2015, 10:02 AM

## 2015-04-09 LAB — CBC
HEMATOCRIT: 34.7 % — AB (ref 36.0–46.0)
Hemoglobin: 11.1 g/dL — ABNORMAL LOW (ref 12.0–15.0)
MCH: 27.9 pg (ref 26.0–34.0)
MCHC: 32 g/dL (ref 30.0–36.0)
MCV: 87.2 fL (ref 78.0–100.0)
Platelets: 131 10*3/uL — ABNORMAL LOW (ref 150–400)
RBC: 3.98 MIL/uL (ref 3.87–5.11)
RDW: 16.4 % — AB (ref 11.5–15.5)
WBC: 7.6 10*3/uL (ref 4.0–10.5)

## 2015-04-09 LAB — TYPE AND SCREEN
ABO/RH(D): B POS
Antibody Screen: NEGATIVE
UNIT DIVISION: 0
UNIT DIVISION: 0
UNIT DIVISION: 0
UNIT DIVISION: 0
Unit division: 0

## 2015-04-09 LAB — PROTIME-INR
INR: 3.58 — ABNORMAL HIGH (ref 0.00–1.49)
Prothrombin Time: 35 seconds — ABNORMAL HIGH (ref 11.6–15.2)

## 2015-04-09 LAB — BASIC METABOLIC PANEL
ANION GAP: 16 — AB (ref 5–15)
BUN: 34 mg/dL — AB (ref 6–20)
CALCIUM: 9.2 mg/dL (ref 8.9–10.3)
CO2: 27 mmol/L (ref 22–32)
Chloride: 100 mmol/L — ABNORMAL LOW (ref 101–111)
Creatinine, Ser: 2.86 mg/dL — ABNORMAL HIGH (ref 0.44–1.00)
GFR calc Af Amer: 19 mL/min — ABNORMAL LOW (ref >60)
GFR calc non Af Amer: 16 mL/min — ABNORMAL LOW (ref >60)
GLUCOSE: 130 mg/dL — AB (ref 65–99)
Potassium: 4.2 mmol/L (ref 3.5–5.1)
Sodium: 143 mmol/L (ref 135–145)

## 2015-04-09 LAB — GLUCOSE, CAPILLARY: Glucose-Capillary: 147 mg/dL — ABNORMAL HIGH (ref 65–99)

## 2015-04-09 MED ORDER — SODIUM CHLORIDE 0.9% FLUSH
10.0000 mL | Freq: Two times a day (BID) | INTRAVENOUS | Status: DC
  Administered 2015-04-09 – 2015-04-11 (×5): 10 mL

## 2015-04-09 MED ORDER — SODIUM CHLORIDE 0.9% FLUSH
10.0000 mL | INTRAVENOUS | Status: DC | PRN
  Administered 2015-04-11 (×2): 20 mL
  Administered 2015-04-12: 30 mL
  Administered 2015-04-13: 20 mL
  Administered 2015-04-14 (×2): 10 mL
  Filled 2015-04-09 (×6): qty 40

## 2015-04-09 NOTE — Progress Notes (Signed)
ANTICOAGULATION CONSULT NOTE - Follow Up Consult  Pharmacy Consult for warfarin Indication: atrial fibrillation and antiphospholipid antibody syndrome, Hx of DVT  Allergies  Allergen Reactions  . Feraheme [Ferumoxytol] Other (See Comments)    Chest pain, pulsating 02/17/15: tolerated Nulecit  . Vancomycin Anaphylaxis, Itching, Swelling and Other (See Comments)    Tongue swell  . Dorzolamide Hcl-Timolol Mal Rash and Other (See Comments)    Eye pain  . Gentamycin [Gentamicin Sulfate] Itching and Swelling  . Latanoprost Rash and Other (See Comments)    Eye pain  . Cefazolin Itching  . Codeine Itching  . Hydrocodone-Acetaminophen Itching  . Penicillins Itching    *tolerated ampicillin during 02/2015 admission Has patient had a PCN reaction causing immediate rash, facial/tongue/throat swelling, SOB or lightheadedness with hypotension:No Has patient had a PCN reaction causing severe rash involving mucus membranes or skin necrosis:No Has patient had a PCN reaction that required hospitalization:No Has patient had a PCN reaction occurring within the last 10 years:No If all of the above answers are "NO", then may proceed with Cephalosporin use.     Patient Measurements: Height: 5\' 1"  (154.9 cm) Weight: 162 lb (73.483 kg) IBW/kg (Calculated) : 47.8  Vital Signs: Temp: 97.9 F (36.6 C) (03/18 1139) Temp Source: Oral (03/18 1139) BP: 161/68 mmHg (03/18 1200) Pulse Rate: 92 (03/18 1200)  Labs:  Recent Labs  04/07/15 0340  04/07/15 1827 04/07/15 1831 04/07/15 2235 04/08/15 0510 04/08/15 1130 04/09/15 0414  HGB 9.0*  < >  --  8.5*  --  8.4*  --  11.1*  HCT 28.7*  < >  --  27.3*  --  26.9*  --  34.7*  PLT 157  --   --   --   --  150  --  131*  LABPROT 64.5*  --   --   --   --   --  29.9* 35.0*  INR 8.08*  --   --   --   --   --  2.91* 3.58*  CREATININE 2.86*  --   --   --   --  2.90*  --  2.86*  TROPONINI 0.25*  --  0.22*  --  0.20* 0.17*  --   --   < > = values in this  interval not displayed.  Estimated Creatinine Clearance: 18 mL/min (by C-G formula based on Cr of 2.86).   Medications:  Prescriptions prior to admission  Medication Sig Dispense Refill Last Dose  . acetaminophen (TYLENOL) 325 MG tablet Take 2 tablets (650 mg total) by mouth every 6 (six) hours as needed for mild pain (or Fever >/= 101).   Past Month at Unknown time  . allopurinol (ZYLOPRIM) 100 MG tablet Take 2 tablets (200 mg total) by mouth daily.   04/06/2015 at Unknown time  . atorvastatin (LIPITOR) 40 MG tablet Take 1 tablet (40 mg total) by mouth daily at 6 PM. 30 tablet 0 04/06/2015 at Unknown time  . bimatoprost (LUMIGAN) 0.01 % SOLN Place 1 drop into both eyes at bedtime.    04/06/2015 at Unknown time  . Brinzolamide-Brimonidine (SIMBRINZA) 1-0.2 % SUSP Place 1 drop into the left eye 3 (three) times daily.   04/06/2015 at Unknown time  . calcitRIOL (ROCALTROL) 0.25 MCG capsule Take 0.25-0.5 mcg by mouth every other day. alternating dosages   04/06/2015 at Unknown time  . carvedilol (COREG) 6.25 MG tablet Take 1 tablet (6.25 mg total) by mouth 2 (two) times daily with a meal. 60 tablet  0 04/06/2015 at 7p  . furosemide (LASIX) 40 MG tablet Take 1 tablet (40 mg total) by mouth daily. 30 tablet 0 04/06/2015 at Unknown time  . isosorbide mononitrate (IMDUR) 60 MG 24 hr tablet Take 1 tablet (60 mg total) by mouth daily. 30 tablet 0 04/06/2015 at Unknown time  . MAGNESIUM-OXIDE 400 (241.3 MG) MG tablet TAKE 1 TABLET BY MOUTH TWICE DAILY 60 tablet 3 04/06/2015 at Unknown time  . mycophenolate (MYFORTIC) 180 MG EC tablet Take 180 mg by mouth 2 (two) times daily.    04/06/2015 at Unknown time  . oxyCODONE-acetaminophen (PERCOCET/ROXICET) 5-325 MG tablet Take 0.5-1 tablets by mouth every 6 (six) hours as needed for moderate pain or severe pain. 10 tablet 0 04/06/2015 at Unknown time  . predniSONE (DELTASONE) 5 MG tablet Take 5 mg by mouth daily with breakfast.    04/06/2015 at Unknown time  . RANEXA 500 MG  12 hr tablet TAKE 1 TABLET BY MOUTH TWICE DAILY 180 tablet 1 04/06/2015 at Unknown time  . ranitidine (ZANTAC) 150 MG tablet Take 150 mg by mouth daily.    04/06/2015 at Unknown time  . sodium bicarbonate 650 MG tablet Take 1,300 mg by mouth 2 (two) times daily.    04/06/2015 at Unknown time  . tacrolimus (PROGRAF) 1 MG capsule Take 2 mg by mouth 2 (two) times daily.    04/06/2015 at Unknown time  . timolol (TIMOPTIC) 0.5 % ophthalmic solution Place 1 drop into both eyes 2 (two) times daily. Reported on 03/13/2015   04/06/2015 at Unknown time  . warfarin (COUMADIN) 2.5 MG tablet Take 0.5-1 tablets (1.25-2.5 mg total) by mouth daily at 6 PM. Monday and Friday 2.5 mg and 1.25 mg on all other days 30 tablet 0 04/06/2015 at 7p  . ampicillin 2 g in sodium chloride 0.9 % 50 mL Inject 2 g into the vein every 8 (eight) hours.   did not start  . methocarbamol (ROBAXIN) 500 MG tablet Take 1 tablet (500 mg total) by mouth 2 (two) times daily. (Patient not taking: Reported on 03/03/2015) 20 tablet 0 Not Taking  . nitroGLYCERIN (NITROSTAT) 0.4 MG SL tablet Place 0.4 mg under the tongue every 5 (five) minutes as needed for chest pain.    unknown    Assessment: 65 yo F admitted 04/06/2015 on warfarin PTA for antiphospholipid antibody syndrome, Hx of DVT and Afib. On admission patient with INR > 10 without active bleeding. Patient given vit K 5 mg x1 on 3/15 and received 3 units RBCs this admission.  Pharmacy consulted to resume warfarin. She also continues on ampicillin (DDI - ampicillin can increase INR, will dose cautiously)  PTA warfarin dose: Takes 2.5 mg on Mon and Fri, 1.25 mg all other days  INR 3.58 (supratherapeutic), Hgb improved 11.1 rec'd 1 unit yesterday, Plt 131  Goal of Therapy:  INR 2-3 Monitor platelets by anticoagulation protocol: Yes   Plan:  - Hold warfarin x1 tonight - Monitor daily INR, CBC and s/sx bleeding  Dimitri Ped, PharmD. PGY-1 Pharmacy Resident Pager: [phone] 04/09/2015,12:24  PM

## 2015-04-09 NOTE — Progress Notes (Signed)
[facility] PROGRESS NOTE  Debbie [name] [id number] DOB: 1950-09-03 DOA: 04/06/2015 PCP: No PCP Per Patient  HPI/Brief narrative 65 y.o. 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 No need to repeat at this time. -continue to diurese gently given hx renal transplant -follow I/O's -Wt today 74.9 -Wt on admit 73.5kg  Chest pain with hx of CAD -previous admissions for same. Pt not a candidate for cath given CKD/renal transplant per previous cardiology consultations. No evidence of infx etiology with normal WBC, afebrile, no cough -Trop initially rising, correlated to presenting anemia, suspect demand mismatch -continue Imdur, BB, statin -Currently chest pain free after prbc transfusion  Coagulopathy on chronic Coumadin with hx of Antiphospholipid syndrome/DVT -follow CBC -u/s RLE ordered by ED for swelling to thigh - neg for DVT  Anemia -chronic disease with Iron studies done 01/2014. Baseline Hgb ~9.0  -No evidence of acute blood loss despite INR>10 -Initially transfused 2 units PRBCs given chest pain on presentation -Would possibly benefit from epo. -Patient given another 2 units of PRBC's secondary to chest pains with hgb <10. Hgb now corrected to over 11  Enteroccoccus bacteremia secondary to septic right shoulder -Repeat blood cultures thus far neg -continued ampicillin until 04/26/2015 per ID recommendations  CKD, stage IV -near baseline now. Monitor closely with diuresis -continue home dose bicarb and calcitrol  Hypertension -well-controlled on home BB -Monitor  Hx kidney transplant  2010 -continue chronic immunosuppression  Hx pafib -currently in NSR, rate controlled -INR presented supratherapeutic -INR trending toward therapeutic range  Hx gout -hold allopurinol while diuresing with hx CKD  R hip pain - xray of R hip done 3/17 with findings of small lytic lesion, recs for bone scan - Discussed with Radiology today with recs for focused MRI of R hip. Patient is adamant against doing MRI even with anxiolytic - Will check SPEP and instead recommend outpatient PET scan - Serum Ca unremarkable  Code Status: DNR Family Communication: Pt in room, family at bedside Disposition Plan: Possible d/c in 24hrs   Consultants:    Procedures:    Antibiotics: Anti-infectives    Start     Dose/Rate Route Frequency Ordered Stop   04/06/15 1515  ampicillin (OMNIPEN) 2 g in sodium chloride 0.9 % 50 mL IVPB     2 g 150 mL/hr over 20 Minutes Intravenous 3 times per day 04/06/15 1505     04/06/15 1230  aztreonam (AZACTAM) 2 g in dextrose 5 % 50 mL IVPB  Status:  Discontinued     2 g 100 mL/hr over 30 Minutes Intravenous  Once 04/06/15 1217 04/06/15 1227      HPI/Subjective: Feels better today. Apprehensive about MRI  Objective: Filed Vitals:   04/09/15 1200 04/09/15 1300 04/09/15 1400 04/09/15 1500  BP: 161/68 157/65 149/69 141/69  Pulse: 92 86 84 83  Temp:      TempSrc:      Resp:      Height:      Weight:      SpO2: 96% 94% 95% 95%    Intake/Output Summary (Last 24 hours) at 04/09/15 1506 Last data filed at 04/09/15 0559  Gross per 24 hour  Intake   1320 ml  Output   1100 ml  Net    220 ml   Filed Weights   04/07/15 0452 04/08/15 0400 04/09/15 0341  Weight: 75.297 kg (166 lb) 74.9 kg (165 lb 2 oz) 73.483 kg (162 lb)    Exam:   General:  Awake, in nad, eating breakfast  Cardiovascular: regular, s1, s2  Respiratory: normal resp effort, no wheezing  Abdomen: soft, nondistended, pos BS  Musculoskeletal: perfused, no clubbing, no  cyanosis  Data Reviewed: Basic Metabolic Panel:  Recent Labs Lab 04/06/15 1200 04/07/15 0340 04/08/15 0510 04/09/15 0414  NA 142 140 140 143  K 3.8 4.0 4.1 4.2  CL 102 103 104 100*  CO2 26 26 28 27   GLUCOSE 109* 119* 114* 130*  BUN 29* 31* 32* 34*  CREATININE 2.81* 2.86* 2.90* 2.86*  CALCIUM 8.9 8.3* 8.6* 9.2   Liver Function Tests:  Recent Labs Lab 04/06/15 1200  AST 42*  ALT 16  ALKPHOS 65  BILITOT 0.9  PROT 5.0*  ALBUMIN 2.0*   No results for input(s): LIPASE, AMYLASE in the last 168 hours. No results for input(s): AMMONIA in the last 168 hours. CBC:  Recent Labs Lab 04/06/15 1200 04/06/15 1606 04/07/15 0340 04/07/15 1005 04/07/15 1330 04/07/15 1831 04/08/15 0510 04/09/15 0414  WBC 7.8 7.3 8.3  --   --   --  7.2 7.6  NEUTROABS  --  4.2  --   --   --   --   --   --   HGB 6.8* 6.6* 9.0* 8.7* 9.2* 8.5* 8.4* 11.1*  HCT 21.6* 21.6* 28.7* 27.5* 27.7* 27.3* 26.9* 34.7*  MCV 86.7 87.4 87.2  --   --   --  87.6 87.2  PLT 176 171 157  --   --   --  150 131*   Cardiac Enzymes:  Recent Labs Lab 04/06/15 2200 04/07/15 0340 04/07/15 1827 04/07/15 2235 04/08/15 0510  TROPONINI 0.19* 0.25* 0.22* 0.20* 0.17*   BNP (last 3 results)  Recent Labs  04/06/15 1200  BNP 717.5*    ProBNP (last 3 results) No results for input(s): PROBNP in the last 8760 hours.  CBG:  Recent Labs Lab 04/09/15 1138  GLUCAP 147*    Recent Results (from the past 240 hour(s))  Culture, blood (Routine X 2) w Reflex to ID Panel     Status: None (Preliminary result)   Collection Time: 04/06/15 10:00 PM  Result Value Ref Range Status   Specimen Description BLOOD LEFT HAND  Final   Special Requests IN PEDIATRIC BOTTLE 2CC  Final   Culture NO GROWTH 2 DAYS  Final   Report Status PENDING  Incomplete  Culture, blood (Routine X 2) w Reflex to ID Panel     Status: None (Preliminary result)   Collection Time: 04/06/15 10:00 PM  Result Value Ref Range Status   Specimen Description  BLOOD LEFT THUMB  Final   Special Requests BOTTLES DRAWN AEROBIC ONLY 6CC  Final   Culture NO GROWTH 2 DAYS  Final   Report Status PENDING  Incomplete  MRSA PCR Screening     Status: None   Collection Time: 04/06/15 11:33 PM  Result Value Ref Range Status   MRSA by PCR NEGATIVE NEGATIVE Final    Comment:        The GeneXpert MRSA Assay (FDA approved for NASAL specimens only), is one component of a comprehensive MRSA colonization surveillance program. It is not intended to diagnose MRSA infection nor to guide or monitor treatment  for MRSA infections.      Studies: Dg Hip Unilat With Pelvis 2-3 Views Right  04/08/2015  CLINICAL DATA:  RIGHT hip pain, fell in bathroom, was unable to move RIGHT leg, history hypertension, end-stage renal disease post renal transplant EXAM: DG HIP (WITH OR WITHOUT PELVIS) 2-3V RIGHT COMPARISON:  None FINDINGS: Diffuse osseous demineralization. Minimal narrowing of both hip joints. SI joint spaces symmetric and preserved. No acute fracture dislocation. Small lytic lesion at the proximal RIGHT femoral metaphysis 16 x 8 mm, nonspecific. Extensive atherosclerotic calcifications. IMPRESSION: No definite acute fracture dislocation. Lytic lesion at proximal RIGHT femur, nonspecific; this could be seen with myeloma, lytic metastasis, brown tumor in the setting of chronic renal failure. Stability is uncertain due to the lack of prior studies. Consider nonemergent follow-up bone survey to assess for additional lesions. Electronically Signed   By: Lavonia Dana M.D.   On: 04/08/2015 16:48    Scheduled Meds: . ampicillin IVPB 2 gram/NS 50 mL  2 g Intravenous 3 times per day  . antiseptic oral rinse  7 mL Mouth Rinse BID  . atorvastatin  40 mg Oral q1800  . calcitRIOL  0.25 mcg Oral QODAY  . calcitRIOL  0.5 mcg Oral QODAY  . carvedilol  6.25 mg Oral BID WC  . famotidine  20 mg Oral Daily  . furosemide  40 mg Intravenous Daily  . isosorbide mononitrate  60 mg Oral Daily   . latanoprost  1 drop Both Eyes QHS  . magnesium oxide  400 mg Oral BID  . methocarbamol  500 mg Oral BID  . mycophenolate  180 mg Oral BID  . predniSONE  5 mg Oral Q breakfast  . ranolazine  500 mg Oral BID  . sodium bicarbonate  1,300 mg Oral BID  . sodium chloride flush  10-40 mL Intracatheter Q12H  . sodium chloride flush  3 mL Intravenous Q12H  . tacrolimus  2 mg Oral BID  . timolol  1 drop Both Eyes BID  . Warfarin - Pharmacist Dosing Inpatient   Does not apply q1800   Continuous Infusions:   Active Problems:   CKD (chronic kidney disease), stage IV (HCC)   HTN (hypertension)   Chronic anticoagulation - with Coumadin   Dyslipidemia   Cardiomyopathy, ischemic-40-45%   Supratherapeutic INR   Enterococcal bacteremia   Anemia   Acute congestive heart failure ([place])   Debbie [name], [facility] Pager [phone]. If 7PM-7AM, please contact night-coverage at [email or web address], password [facility] 04/09/2015, 3:06 PM  LOS: 3 days

## 2015-04-09 NOTE — Progress Notes (Signed)
Patient has refused to go to MRI, Patient`s daughter would like to "talk her into it" and will come to visit her this afternoon.

## 2015-04-10 LAB — BASIC METABOLIC PANEL
Anion gap: 13 (ref 5–15)
BUN: 36 mg/dL — ABNORMAL HIGH (ref 6–20)
CHLORIDE: 100 mmol/L — AB (ref 101–111)
CO2: 28 mmol/L (ref 22–32)
CREATININE: 2.84 mg/dL — AB (ref 0.44–1.00)
Calcium: 9.1 mg/dL (ref 8.9–10.3)
GFR calc non Af Amer: 16 mL/min — ABNORMAL LOW (ref >60)
GFR, EST AFRICAN AMERICAN: 19 mL/min — AB (ref >60)
Glucose, Bld: 123 mg/dL — ABNORMAL HIGH (ref 65–99)
POTASSIUM: 4.1 mmol/L (ref 3.5–5.1)
SODIUM: 141 mmol/L (ref 135–145)

## 2015-04-10 LAB — CBC
HCT: 31.8 % — ABNORMAL LOW (ref 36.0–46.0)
HEMOGLOBIN: 10.5 g/dL — AB (ref 12.0–15.0)
MCH: 29.3 pg (ref 26.0–34.0)
MCHC: 33 g/dL (ref 30.0–36.0)
MCV: 88.8 fL (ref 78.0–100.0)
Platelets: 132 10*3/uL — ABNORMAL LOW (ref 150–400)
RBC: 3.58 MIL/uL — AB (ref 3.87–5.11)
RDW: 17.1 % — ABNORMAL HIGH (ref 11.5–15.5)
WBC: 7.3 10*3/uL (ref 4.0–10.5)

## 2015-04-10 LAB — PROTIME-INR
INR: 4.94 — AB (ref 0.00–1.49)
PROTHROMBIN TIME: 44.5 s — AB (ref 11.6–15.2)

## 2015-04-10 NOTE — Progress Notes (Signed)
Pt tx order to [facility] per MD order, Pt VSS, pt verbalized understanding of tx, report called to receiving RN, all questions answered, nursing will cont to monitor

## 2015-04-10 NOTE — Evaluation (Signed)
Physical Therapy Evaluation Patient Details Name: Debbie [name] MRN: [id number] DOB: 04-02-1950 Today's Date: 04/10/2015   History of Present Illness  Patient is a 65 yo female admitted 04/06/15 with anemia, weakness, CHF.   PMH:  Septic Rt shoulder, Rt hip pain, RLE fx, CKD, renal transplant, HTN, CAD, CHF, PAF, gout    Clinical Impression  Patient presents with problems listed below.  Will benefit from acute PT to maximize functional mobility prior to discharge.  Recommend patient return to SNF to continue therapy at discharge.    Follow Up Recommendations SNF;Supervision/Assistance - 24 hour    Equipment Recommendations  None recommended by PT    Recommendations for Other Services       Precautions / Restrictions Precautions Precautions: Fall Restrictions Weight Bearing Restrictions: Yes RLE Weight Bearing:  (Do not see orders in chart.  Was NWB at time of fx.)      Mobility  Bed Mobility Overal bed mobility: Needs Assistance;+2 for physical assistance Bed Mobility: Rolling;Sidelying to Sit;Sit to Sidelying Rolling: Mod assist Sidelying to sit: Mod assist;+2 for physical assistance     Sit to sidelying: Mod assist;+2 for physical assistance General bed mobility comments: Verbal cues for technique.  Required Mod +2 assist and increased time to move to sitting and back to sidelying.  Once upright, patient able to maintain sitting balance with min guard assist.  Assist to reposition in bed.  Transfers                 General transfer comment: Patient declined - "I can't stand"  Ambulation/Gait                Stairs            Wheelchair Mobility    Modified Rankin (Stroke Patients Only)       Balance Overall balance assessment: Needs assistance Sitting-balance support: Single extremity supported;Feet supported Sitting balance-Leahy Scale: Fair Sitting balance - Comments: Flexed posture.                                      Pertinent Vitals/Pain Pain Assessment: No/denies pain    Home Living Family/patient expects to be discharged to:: [place]: [place] - 2 wheels;Cane - single point;Wheelchair - manual      Prior Function Level of Independence: Needs assistance   Gait / Transfers Assistance Needed: Working with PT at [employer] on strengthening exercises and attempting standing with +2 assist in parallel bars per patient.  ADL's / Homemaking Assistance Needed: Assist with all ADL's; assist with eating due to decreased ROM Rt shoulder        Hand Dominance   Dominant Hand: Right    Extremity/Trunk Assessment   Upper Extremity Assessment: RUE deficits/detail;LUE deficits/detail RUE Deficits / Details: Rt shoulder ROM to 30* flexion.     LUE Deficits / Details: Rt shoulder ROM to 60* flexion.   Lower Extremity Assessment: LLE deficits/detail;RLE deficits/detail RLE Deficits / Details: Rt ankle fx;  Strength grossly 3-/5 throughout LLE Deficits / Details: Strength grossly 3/5     Communication   Communication: No difficulties  Cognition Arousal/Alertness: Awake/alert Behavior During Therapy: WFL for tasks assessed/performed Overall Cognitive Status: History of cognitive impairments - at baseline  General Comments      Exercises        Assessment/Plan    PT Assessment Patient needs continued PT services  PT Diagnosis Difficulty walking;Generalized weakness;Altered mental status   PT Problem List Decreased strength;Decreased activity tolerance;Decreased balance;Decreased mobility;Decreased cognition;Decreased knowledge of use of DME;Cardiopulmonary status limiting activity  PT Treatment Interventions DME instruction;Functional mobility training;Therapeutic activities;Therapeutic exercise;Gait training;Balance training;Patient/family education   PT Goals (Current goals can be found in the Care Plan  section) Acute Rehab PT Goals Patient Stated Goal: To get stronger PT Goal Formulation: With patient/family Time For Goal Achievement: 04/24/15 Potential to Achieve Goals: Fair    Frequency Min 2X/week   Barriers to discharge        Co-evaluation               End of Session   Activity Tolerance: Patient limited by fatigue Patient left: in bed;with call bell/phone within reach;with bed alarm set;with family/visitor present Nurse Communication: Mobility status;Need for lift equipment         Time: 1340-1403 PT Time Calculation (min) (ACUTE ONLY): 23 min   Charges:   PT Evaluation $PT Eval Moderate Complexity: 1 Procedure PT Treatments $Therapeutic Activity: 8-22 mins   PT G Codes:        Despina Pole 04-11-15, 4:40 PM Carita Pian. Sanjuana Kava, Friend Pager [phone]

## 2015-04-10 NOTE — Progress Notes (Signed)
[facility] PROGRESS NOTE  Debbie [name] [id number] DOB: 1950/04/07 DOA: 04/06/2015 PCP: No PCP Per Patient  HPI/Brief narrative 65 y.o. 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 No need to repeat at this time. -Tolerating gentle diuresis -cont to follow I/O's -Wt today 74.9kg -Wt on admit 73.4kg  Chest pain with hx of CAD -previous admissions for same. Pt not a candidate for cath given CKD/renal transplant per previous cardiology consultations. No evidence of infx etiology with normal WBC, afebrile, no cough -Trop initially rising, correlated to presenting anemia, suspect demand mismatch -continue Imdur, BB, statin -Currently chest pain free after prbc transfusion  Coagulopathy on chronic Coumadin with hx of Antiphospholipid syndrome/DVT -follow CBC -u/s RLE ordered by ED for swelling to thigh - neg for DVT  Anemia -chronic disease with Iron studies done 01/2014. Baseline Hgb ~9.0  -No evidence of acute blood loss despite INR>10 -Initially transfused 2 units PRBCs given chest pain on presentation -Would possibly benefit from epo down the road -Patient given another 2 units of PRBC's secondary to chest pains with hgb <10. Hgb stable thus far  Enteroccoccus bacteremia secondary to septic right shoulder -Repeat blood cultures thus far neg -continued ampicillin until 04/26/2015 per ID recommendations  CKD, stage IV -near baseline now. Monitor closely with diuresis -continue home dose bicarb and calcitrol  Hypertension -well-controlled on home BB -Monitor  Hx kidney transplant 2010 -continue  chronic immunosuppression  Hx pafib -currently in NSR, rate controlled -INR presented supratherapeutic -INR had trended toward therapeutic range however is trending back up to 4.94 today  Hx gout -holding allopurinol while diuresing with hx CKD  R hip pain - Xray of R hip done 3/17 with findings of small lytic lesion, recs for bone scan - Discussed with Radiology today with recs for focused MRI of R hip. Patient is adamant against doing MRI even with anxiolytic - SPEP pending - Recommend outpatient PET scan per initial Radiology recs as pt is not agreeable to MRI - Serum Ca unremarkable  Code Status: DNR Family Communication: Pt in room, family at bedside Disposition Plan: Possible d/c in 24hrs if INR improves to therapeutic range   Consultants:    Procedures:    Antibiotics: Anti-infectives    Start     Dose/Rate Route Frequency Ordered Stop   04/06/15 1515  ampicillin (OMNIPEN) 2 g in sodium chloride 0.9 % 50 mL IVPB     2 g 150 mL/hr over 20 Minutes Intravenous 3 times per day 04/06/15 1505     04/06/15 1230  aztreonam (AZACTAM) 2 g in dextrose 5 % 50 mL IVPB  Status:  Discontinued     2 g 100 mL/hr over 30 Minutes Intravenous  Once 04/06/15 1217 04/06/15 1227      HPI/Subjective: No complaints this AM. Seems to be in good spirits  Objective: Filed Vitals:   04/10/15 1100 04/10/15 1147 04/10/15 1200 04/10/15 1241  BP: 140/63 140/63 137/59 134/50  Pulse: 89 88 86 88  Temp:  98 F (36.7 C)  98.6 F (37 C)  TempSrc:  Oral  Oral  Resp:  18  18  Height:      Weight:      SpO2:  98% 97% 96% 100%    Intake/Output Summary (Last 24 hours) at 04/10/15 1438 Last data filed at 04/10/15 1147  Gross per 24 hour  Intake    420 ml  Output   1300 ml  Net   -880 ml   Filed Weights   04/08/15 0400 04/09/15 0341 04/10/15 0329  Weight: 74.9 kg (165 lb 2 oz) 73.483 kg (162 lb) 73.483 kg (162 lb)    Exam:   General:  Awake, in nad, laying in  bed  Cardiovascular: regular, s1, s2  Respiratory: normal resp effort, no wheezing  Abdomen: soft, nondistended, pos BS  Musculoskeletal: perfused, no clubbing, no cyanosis  Data Reviewed: Basic Metabolic Panel:  Recent Labs Lab 04/06/15 1200 04/07/15 0340 04/08/15 0510 04/09/15 0414 04/10/15 0412  NA 142 140 140 143 141  K 3.8 4.0 4.1 4.2 4.1  CL 102 103 104 100* 100*  CO2 26 26 28 27 28   GLUCOSE 109* 119* 114* 130* 123*  BUN 29* 31* 32* 34* 36*  CREATININE 2.81* 2.86* 2.90* 2.86* 2.84*  CALCIUM 8.9 8.3* 8.6* 9.2 9.1   Liver Function Tests:  Recent Labs Lab 04/06/15 1200  AST 42*  ALT 16  ALKPHOS 65  BILITOT 0.9  PROT 5.0*  ALBUMIN 2.0*   No results for input(s): LIPASE, AMYLASE in the last 168 hours. No results for input(s): AMMONIA in the last 168 hours. CBC:  Recent Labs Lab 04/06/15 1606 04/07/15 0340  04/07/15 1330 04/07/15 1831 04/08/15 0510 04/09/15 0414 04/10/15 0412  WBC 7.3 8.3  --   --   --  7.2 7.6 7.3  NEUTROABS 4.2  --   --   --   --   --   --   --   HGB 6.6* 9.0*  < > 9.2* 8.5* 8.4* 11.1* 10.5*  HCT 21.6* 28.7*  < > 27.7* 27.3* 26.9* 34.7* 31.8*  MCV 87.4 87.2  --   --   --  87.6 87.2 88.8  PLT 171 157  --   --   --  150 131* 132*  < > = values in this interval not displayed. Cardiac Enzymes:  Recent Labs Lab 04/06/15 2200 04/07/15 0340 04/07/15 1827 04/07/15 2235 04/08/15 0510  TROPONINI 0.19* 0.25* 0.22* 0.20* 0.17*   BNP (last 3 results)  Recent Labs  04/06/15 1200  BNP 717.5*    ProBNP (last 3 results) No results for input(s): PROBNP in the last 8760 hours.  CBG:  Recent Labs Lab 04/09/15 1138  GLUCAP 147*    Recent Results (from the past 240 hour(s))  Culture, blood (Routine X 2) w Reflex to ID Panel     Status: None (Preliminary result)   Collection Time: 04/06/15 10:00 PM  Result Value Ref Range Status   Specimen Description BLOOD LEFT HAND  Final   Special Requests IN PEDIATRIC BOTTLE 2CC  Final    Culture NO GROWTH 4 DAYS  Final   Report Status PENDING  Incomplete  Culture, blood (Routine X 2) w Reflex to ID Panel     Status: None (Preliminary result)   Collection Time: 04/06/15 10:00 PM  Result Value Ref Range Status   Specimen Description BLOOD LEFT THUMB  Final   Special Requests BOTTLES DRAWN AEROBIC ONLY 6CC  Final   Culture NO GROWTH 4 DAYS  Final   Report Status PENDING  Incomplete  MRSA PCR Screening     Status: None   Collection Time: 04/06/15 11:33 PM  Result Value Ref  Range Status   MRSA by PCR NEGATIVE NEGATIVE Final    Comment:        The GeneXpert MRSA Assay (FDA approved for NASAL specimens only), is one component of a comprehensive MRSA colonization surveillance program. It is not intended to diagnose MRSA infection nor to guide or monitor treatment for MRSA infections.      Studies: Dg Hip Unilat With Pelvis 2-3 Views Right  04/08/2015  CLINICAL DATA:  RIGHT hip pain, fell in bathroom, was unable to move RIGHT leg, history hypertension, end-stage renal disease post renal transplant EXAM: DG HIP (WITH OR WITHOUT PELVIS) 2-3V RIGHT COMPARISON:  None FINDINGS: Diffuse osseous demineralization. Minimal narrowing of both hip joints. SI joint spaces symmetric and preserved. No acute fracture dislocation. Small lytic lesion at the proximal RIGHT femoral metaphysis 16 x 8 mm, nonspecific. Extensive atherosclerotic calcifications. IMPRESSION: No definite acute fracture dislocation. Lytic lesion at proximal RIGHT femur, nonspecific; this could be seen with myeloma, lytic metastasis, brown tumor in the setting of chronic renal failure. Stability is uncertain due to the lack of prior studies. Consider nonemergent follow-up bone survey to assess for additional lesions. Electronically Signed   By: Lavonia Dana M.D.   On: 04/08/2015 16:48    Scheduled Meds: . ampicillin IVPB 2 gram/NS 50 mL  2 g Intravenous 3 times per day  . antiseptic oral rinse  7 mL Mouth Rinse BID  .  atorvastatin  40 mg Oral q1800  . calcitRIOL  0.25 mcg Oral QODAY  . calcitRIOL  0.5 mcg Oral QODAY  . carvedilol  6.25 mg Oral BID WC  . famotidine  20 mg Oral Daily  . furosemide  40 mg Intravenous Daily  . isosorbide mononitrate  60 mg Oral Daily  . latanoprost  1 drop Both Eyes QHS  . magnesium oxide  400 mg Oral BID  . methocarbamol  500 mg Oral BID  . mycophenolate  180 mg Oral BID  . predniSONE  5 mg Oral Q breakfast  . ranolazine  500 mg Oral BID  . sodium bicarbonate  1,300 mg Oral BID  . sodium chloride flush  10-40 mL Intracatheter Q12H  . sodium chloride flush  3 mL Intravenous Q12H  . tacrolimus  2 mg Oral BID  . timolol  1 drop Both Eyes BID  . Warfarin - Pharmacist Dosing Inpatient   Does not apply q1800   Continuous Infusions:   Active Problems:   CKD (chronic kidney disease), stage IV (HCC)   HTN (hypertension)   Chronic anticoagulation - with Coumadin   Dyslipidemia   Cardiomyopathy, ischemic-40-45%   Supratherapeutic INR   Enterococcal bacteremia   Anemia   Acute congestive heart failure ([place])   [facility], [facility] Pager [phone]. If 7PM-7AM, please contact night-coverage at [email or web address], password [facility] 04/10/2015, 2:38 PM  LOS: 4 days

## 2015-04-10 NOTE — Progress Notes (Signed)
ANTICOAGULATION CONSULT NOTE - Follow Up Consult  Pharmacy Consult for warfarin Indication: atrial fibrillation and antiphospholipid antibody syndrome, Hx of DVT  Allergies  Allergen Reactions  . Feraheme [Ferumoxytol] Other (See Comments)    Chest pain, pulsating 02/17/15: tolerated Nulecit  . Vancomycin Anaphylaxis, Itching, Swelling and Other (See Comments)    Tongue swell  . Dorzolamide Hcl-Timolol Mal Rash and Other (See Comments)    Eye pain  . Gentamycin [Gentamicin Sulfate] Itching and Swelling  . Latanoprost Rash and Other (See Comments)    Eye pain  . Cefazolin Itching  . Codeine Itching  . Hydrocodone-Acetaminophen Itching  . Penicillins Itching    *tolerated ampicillin during 02/2015 admission Has patient had a PCN reaction causing immediate rash, facial/tongue/throat swelling, SOB or lightheadedness with hypotension:No Has patient had a PCN reaction causing severe rash involving mucus membranes or skin necrosis:No Has patient had a PCN reaction that required hospitalization:No Has patient had a PCN reaction occurring within the last 10 years:No If all of the above answers are "NO", then may proceed with Cephalosporin use.     Patient Measurements: Height: 5\' 1"  (154.9 cm) Weight: 162 lb (73.483 kg) IBW/kg (Calculated) : 47.8  Vital Signs: Temp: 98.2 F (36.8 C) (03/19 0759) Temp Source: Oral (03/19 0759) BP: 135/119 mmHg (03/19 0800) Pulse Rate: 94 (03/19 0800)  Labs:  Recent Labs  04/07/15 1827  04/07/15 2235 04/08/15 0510 04/08/15 1130 04/09/15 0414 04/10/15 0412  HGB  --   < >  --  8.4*  --  11.1* 10.5*  HCT  --   < >  --  26.9*  --  34.7* 31.8*  PLT  --   --   --  150  --  131* 132*  LABPROT  --   --   --   --  29.9* 35.0* 44.5*  INR  --   --   --   --  2.91* 3.58* 4.94*  CREATININE  --   --   --  2.90*  --  2.86* 2.84*  TROPONINI 0.22*  --  0.20* 0.17*  --   --   --   < > = values in this interval not displayed.  Estimated Creatinine  Clearance: 18.1 mL/min (by C-G formula based on Cr of 2.84).   Medications:  Prescriptions prior to admission  Medication Sig Dispense Refill Last Dose  . acetaminophen (TYLENOL) 325 MG tablet Take 2 tablets (650 mg total) by mouth every 6 (six) hours as needed for mild pain (or Fever >/= 101).   Past Month at Unknown time  . allopurinol (ZYLOPRIM) 100 MG tablet Take 2 tablets (200 mg total) by mouth daily.   04/06/2015 at Unknown time  . atorvastatin (LIPITOR) 40 MG tablet Take 1 tablet (40 mg total) by mouth daily at 6 PM. 30 tablet 0 04/06/2015 at Unknown time  . bimatoprost (LUMIGAN) 0.01 % SOLN Place 1 drop into both eyes at bedtime.    04/06/2015 at Unknown time  . Brinzolamide-Brimonidine (SIMBRINZA) 1-0.2 % SUSP Place 1 drop into the left eye 3 (three) times daily.   04/06/2015 at Unknown time  . calcitRIOL (ROCALTROL) 0.25 MCG capsule Take 0.25-0.5 mcg by mouth every other day. alternating dosages   04/06/2015 at Unknown time  . carvedilol (COREG) 6.25 MG tablet Take 1 tablet (6.25 mg total) by mouth 2 (two) times daily with a meal. 60 tablet 0 04/06/2015 at 7p  . furosemide (LASIX) 40 MG tablet Take 1 tablet (40 mg total)  by mouth daily. 30 tablet 0 04/06/2015 at Unknown time  . isosorbide mononitrate (IMDUR) 60 MG 24 hr tablet Take 1 tablet (60 mg total) by mouth daily. 30 tablet 0 04/06/2015 at Unknown time  . MAGNESIUM-OXIDE 400 (241.3 MG) MG tablet TAKE 1 TABLET BY MOUTH TWICE DAILY 60 tablet 3 04/06/2015 at Unknown time  . mycophenolate (MYFORTIC) 180 MG EC tablet Take 180 mg by mouth 2 (two) times daily.    04/06/2015 at Unknown time  . oxyCODONE-acetaminophen (PERCOCET/ROXICET) 5-325 MG tablet Take 0.5-1 tablets by mouth every 6 (six) hours as needed for moderate pain or severe pain. 10 tablet 0 04/06/2015 at Unknown time  . predniSONE (DELTASONE) 5 MG tablet Take 5 mg by mouth daily with breakfast.    04/06/2015 at Unknown time  . RANEXA 500 MG 12 hr tablet TAKE 1 TABLET BY MOUTH TWICE DAILY  180 tablet 1 04/06/2015 at Unknown time  . ranitidine (ZANTAC) 150 MG tablet Take 150 mg by mouth daily.    04/06/2015 at Unknown time  . sodium bicarbonate 650 MG tablet Take 1,300 mg by mouth 2 (two) times daily.    04/06/2015 at Unknown time  . tacrolimus (PROGRAF) 1 MG capsule Take 2 mg by mouth 2 (two) times daily.    04/06/2015 at Unknown time  . timolol (TIMOPTIC) 0.5 % ophthalmic solution Place 1 drop into both eyes 2 (two) times daily. Reported on 03/13/2015   04/06/2015 at Unknown time  . warfarin (COUMADIN) 2.5 MG tablet Take 0.5-1 tablets (1.25-2.5 mg total) by mouth daily at 6 PM. Monday and Friday 2.5 mg and 1.25 mg on all other days 30 tablet 0 04/06/2015 at 7p  . ampicillin 2 g in sodium chloride 0.9 % 50 mL Inject 2 g into the vein every 8 (eight) hours.   did not start  . methocarbamol (ROBAXIN) 500 MG tablet Take 1 tablet (500 mg total) by mouth 2 (two) times daily. (Patient not taking: Reported on 03/03/2015) 20 tablet 0 Not Taking  . nitroGLYCERIN (NITROSTAT) 0.4 MG SL tablet Place 0.4 mg under the tongue every 5 (five) minutes as needed for chest pain.    unknown    Assessment: 65 yo F admitted 04/06/2015 on warfarin PTA for antiphospholipid antibody syndrome, Hx of DVT and Afib. On admission patient with INR > 10 without active bleeding. Patient given vit K 5 mg x1 on 3/15 and received 3 units RBCs this admission.  Pharmacy consulted to dose warfarin. She also continues on ampicillin (DDI - ampicillin can increase INR, will dose cautiously)  PTA warfarin dose: Takes 2.5 mg on Mon and Fri, 1.25 mg all other days  INR 4.94 (supratherapeutic, despite holding dose yesterday), Hgb improved 10.5, Plt 132. Per RN, no bleeding.  Goal of Therapy:  INR 2-3 Monitor platelets by anticoagulation protocol: Yes   Plan:  - Hold warfarin x1 tonight - Monitor daily INR, CBC and s/sx bleeding  Dimitri Ped, PharmD. PGY-1 Pharmacy Resident Pager: [phone] 04/10/2015,8:26 AM

## 2015-04-10 NOTE — Consult Note (Signed)
WOC wound consult note Reason for Consult: sacrum pressure injury Patient just seen 04/07/15 per my partner Wound type: MASD (moisture associated skin damage) over the bilateral upper buttocks/sacrum  Measurement: 4 open areas 5cm x 3cm x 0.1cm right buttock; 3cm x 3cm x 0.2cm; 4cm x 3cm x 0.2cm x and 2cm x 2cm x 0.2cm over the left buttock  Wound bed: pink, clean moist Drainage (amount, consistency, odor) minimal  Periwound:intact  Dressing procedure/placement/frequency: Continue silicone foam dressing to protect and insulate area.  If frequent stooling becomes an issue, may use barrier cream instead.   Discussed POC with patient and bedside nurse.  Re consult if needed, will not follow at this time. Thanks  Andray Assefa Kellogg, [place] [phone])

## 2015-04-11 ENCOUNTER — Inpatient Hospital Stay (HOSPITAL_COMMUNITY): Payer: Medicare Other

## 2015-04-11 LAB — PROTIME-INR
INR: 4.55 — AB (ref 0.00–1.49)
PROTHROMBIN TIME: 41.8 s — AB (ref 11.6–15.2)

## 2015-04-11 LAB — CULTURE, BLOOD (ROUTINE X 2)
CULTURE: NO GROWTH
Culture: NO GROWTH

## 2015-04-11 LAB — PROTEIN ELECTROPHORESIS, SERUM
A/G Ratio: 1.3 (ref 0.7–1.7)
ALBUMIN ELP: 3 g/dL (ref 2.9–4.4)
ALPHA-1-GLOBULIN: 0.4 g/dL (ref 0.0–0.4)
Alpha-2-Globulin: 0.7 g/dL (ref 0.4–1.0)
BETA GLOBULIN: 0.7 g/dL (ref 0.7–1.3)
GAMMA GLOBULIN: 0.7 g/dL (ref 0.4–1.8)
Globulin, Total: 2.4 g/dL (ref 2.2–3.9)
TOTAL PROTEIN ELP: 5.4 g/dL — AB (ref 6.0–8.5)

## 2015-04-11 LAB — BASIC METABOLIC PANEL
ANION GAP: 11 (ref 5–15)
BUN: 34 mg/dL — ABNORMAL HIGH (ref 6–20)
CHLORIDE: 99 mmol/L — AB (ref 101–111)
CO2: 29 mmol/L (ref 22–32)
Calcium: 9 mg/dL (ref 8.9–10.3)
Creatinine, Ser: 2.88 mg/dL — ABNORMAL HIGH (ref 0.44–1.00)
GFR calc non Af Amer: 16 mL/min — ABNORMAL LOW (ref >60)
GFR, EST AFRICAN AMERICAN: 19 mL/min — AB (ref >60)
Glucose, Bld: 125 mg/dL — ABNORMAL HIGH (ref 65–99)
POTASSIUM: 4.2 mmol/L (ref 3.5–5.1)
SODIUM: 139 mmol/L (ref 135–145)

## 2015-04-11 LAB — CBC
HEMATOCRIT: 31.4 % — AB (ref 36.0–46.0)
HEMOGLOBIN: 10 g/dL — AB (ref 12.0–15.0)
MCH: 28.4 pg (ref 26.0–34.0)
MCHC: 31.8 g/dL (ref 30.0–36.0)
MCV: 89.2 fL (ref 78.0–100.0)
Platelets: 121 10*3/uL — ABNORMAL LOW (ref 150–400)
RBC: 3.52 MIL/uL — AB (ref 3.87–5.11)
RDW: 17.1 % — ABNORMAL HIGH (ref 11.5–15.5)
WBC: 7.3 10*3/uL (ref 4.0–10.5)

## 2015-04-11 NOTE — Progress Notes (Deleted)
Febrile 102 . Cooling measure done . Tylenol 2 tabs given. on antibiotics blood cultures negative chest x ray not signicant. tlc in place Place a call to md

## 2015-04-11 NOTE — Progress Notes (Signed)
Placed a call about pt's wrong charting

## 2015-04-11 NOTE — Progress Notes (Signed)
OT Cancellation Note  Patient Details Name: Debbie [name] MRN: [id number] DOB: Apr 23, 1950   Cancelled Treatment:    Reason Eval/Treat Not Completed: Other (comment) Pt is Medicare and current D/C plan is back to SNF. No apparent immediate acute care OT needs, therefore will defer OT to SNF. If OT eval is needed please call Acute Rehab Dept. at [phone] or text page OT at [phone].      Almon Register [id number] 04/11/2015, 7:21 AM

## 2015-04-11 NOTE — Progress Notes (Signed)
ANTICOAGULATION CONSULT NOTE - Follow Up Consult  Pharmacy Consult for warfarin Indication: atrial fibrillation and antiphospholipid antibody syndrome, Hx of DVT  Allergies  Allergen Reactions  . Feraheme [Ferumoxytol] Other (See Comments)    Chest pain, pulsating 02/17/15: tolerated Nulecit  . Vancomycin Anaphylaxis, Itching, Swelling and Other (See Comments)    Tongue swell  . Dorzolamide Hcl-Timolol Mal Rash and Other (See Comments)    Eye pain  . Gentamycin [Gentamicin Sulfate] Itching and Swelling  . Latanoprost Rash and Other (See Comments)    Eye pain  . Cefazolin Itching  . Codeine Itching  . Hydrocodone-Acetaminophen Itching  . Penicillins Itching    *tolerated ampicillin during 02/2015 admission Has patient had a PCN reaction causing immediate rash, facial/tongue/throat swelling, SOB or lightheadedness with hypotension:No Has patient had a PCN reaction causing severe rash involving mucus membranes or skin necrosis:No Has patient had a PCN reaction that required hospitalization:No Has patient had a PCN reaction occurring within the last 10 years:No If all of the above answers are "NO", then may proceed with Cephalosporin use.     Patient Measurements: Height: 5\' 1"  (154.9 cm) Weight: 162 lb (73.483 kg) IBW/kg (Calculated) : 47.8  Vital Signs: Temp: 98.6 F (37 C) (03/20 0645) Temp Source: Oral (03/20 0645) BP: 142/58 mmHg (03/20 0645) Pulse Rate: 90 (03/20 0645)  Labs:  Recent Labs  04/09/15 0414 04/10/15 0412 04/11/15 0112  HGB 11.1* 10.5* 10.0*  HCT 34.7* 31.8* 31.4*  PLT 131* 132* 121*  LABPROT 35.0* 44.5* 41.8*  INR 3.58* 4.94* 4.55*  CREATININE 2.86* 2.84* 2.88*    Estimated Creatinine Clearance: 17.9 mL/min (by C-G formula based on Cr of 2.88).   Assessment: 65 yo F admitted 04/06/2015 on warfarin PTA for antiphospholipid antibody syndrome, Hx of DVT and Afib. On admission patient with INR > 10 without active bleeding. Patient given vit K 5 mg  x1 on 3/15 and received 3 units RBCs this admission.  Pharmacy consulted to dose warfarin. She also continues on ampicillin (DDI - ampicillin can increase INR, will dose cautiously)  PTA warfarin dose: Takes 2.5 mg on Mon and Fri, 1.25 mg all other days  INR 4.55 (supratherapeutic, despite holding 2 doses), Hgb stable, Pltc trending down, no bleeding noted.  Goal of Therapy:  INR 2-3 Monitor platelets by anticoagulation protocol: Yes   Plan:  - Hold warfarin tonight - will resume as INR trends down - Monitor daily INR, CBC and s/sx bleeding  [facility], Pharm.D., BCPS Clinical Pharmacist Pager: [phone] 04/11/2015 11:14 AM

## 2015-04-11 NOTE — Care Management Important Message (Signed)
Important Message  Patient Details  Name: ERANDI HOHENSEE MRN: [id number] Date of Birth: [birth year]   Medicare Important Message Given:  Yes    Lakea Mittelman P Landen Knoedler 04/11/2015, 3:35 PM

## 2015-04-11 NOTE — Consult Note (Signed)
   [facility] CM Inpatient Consult   04/11/2015  SATIVA DRILLING 06/13/1950 [id number] Patient is currently active with [facility] for chronic disease management services recently admitted from a skilled nursing facility, [facility].  Patient has been engaged by a [employer].  Patient's current disposition from Education officer, museum notes is for continued short term skilled facility. Patient has an active consent for [facility] Care Management services on file.   Our community based plan of care has focused on disease management and community resource support for patient's plan of returning home.  Patient will receive a post discharge follow upl and will be evaluated for monthly home visits for assessments and disease process education.  Made Inpatient Case Manager aware that [facility] following. Of note, [facility] Care Management services does not replace or interfere with any services that are needed or arranged by inpatient case management or social work.  For additional questions or referrals please contact: Natividad Brood, RN BSN [facility] Liaison  [phone] business mobile phone Toll free office [phone]

## 2015-04-11 NOTE — Progress Notes (Signed)
[facility] PROGRESS NOTE  Debbie [name] [id number] DOB: April 27, 1950 DOA: 04/06/2015 PCP: No PCP Per Patient  HPI/Brief narrative 65 y.o. 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 No need to repeat at this time. -Tolerating gentle diuresis -cont to follow I/O's -Wt today 73.48kg -Wt on admit 73.4kg  Chest pain with hx of CAD -previous admissions for same. Pt not a candidate for cath given CKD/renal transplant per previous cardiology consultations. No evidence of infx etiology with normal WBC, afebrile, no cough -Trop initially rising, correlated to presenting anemia, suspect demand mismatch -continue Imdur, BB, statin -Currently chest pain free after prbc transfusion  Coagulopathy on chronic Coumadin with hx of Antiphospholipid syndrome/DVT -follow CBC -u/s RLE ordered by ED for swelling to thigh - neg for DVT  Anemia -chronic disease with Iron studies done 01/2014. Baseline Hgb ~9.0  -No evidence of acute blood loss despite INR>10 -Initially transfused 2 units PRBCs given chest pain on presentation -Would possibly benefit from epo down the road -Patient given another 2 units of PRBC's secondary to chest pains with hgb <10. Hgb stable thus far  Enteroccoccus bacteremia secondary to septic right shoulder -Repeat blood cultures thus far neg -continued ampicillin until 04/26/2015 per ID recommendations  CKD, stage IV -near baseline now. Monitor closely with diuresis -continue home dose bicarb and calcitrol  Hypertension -well-controlled on home BB -Monitor  Hx kidney transplant 2010 -continue  chronic immunosuppression  Hx pafib -currently in NSR, rate controlled -INR presented markedly supratherapeutic, possibly secondary to interaction with antibiotics -INR remains supertherapeutic, but is slowly trending down. Pharmacy managing coumadin dose  Hx gout -holding allopurinol while diuresing with hx CKD  R hip pain - Xray of R hip done 3/17 with findings of small lytic lesion, recs for bone scan - Discussed with Radiology today with recs for focused MRI of R hip. Patient is adamant against doing MRI even with anxiolytic - SPEP pending - Recommend outpatient PET scan on discharge per initial Radiology recs as pt is not agreeable to MRI here - Serum Ca unremarkable  Code Status: DNR Family Communication: Pt in room, family at bedside Disposition Plan: Possible d/c in 24hrs if INR improves to therapeutic range   Consultants:    Procedures:    Antibiotics: Anti-infectives    Start     Dose/Rate Route Frequency Ordered Stop   04/06/15 1515  ampicillin (OMNIPEN) 2 g in sodium chloride 0.9 % 50 mL IVPB     2 g 150 mL/hr over 20 Minutes Intravenous 3 times per day 04/06/15 1505     04/06/15 1230  aztreonam (AZACTAM) 2 g in dextrose 5 % 50 mL IVPB  Status:  Discontinued     2 g 100 mL/hr over 30 Minutes Intravenous  Once 04/06/15 1217 04/06/15 1227      HPI/Subjective: Patient without complaints. Reports feeling better  Objective: Filed Vitals:   04/11/15 0200 04/11/15 0645 04/11/15 1200 04/11/15 1414  BP: 152/62 142/58 139/54 119/67  Pulse: 86 90 84 83  Temp: 98.6 F (37 C) 98.6 F (37 C) 98.5 F (36.9 C) 98.6 F (37 C)  TempSrc: Oral Oral Oral Oral  Resp: 18 18    Height:  Weight:      SpO2: 98% 96% 98% 100%    Intake/Output Summary (Last 24 hours) at 04/11/15 1440 Last data filed at 04/11/15 1400  Gross per 24 hour  Intake    760 ml  Output   1726 ml  Net   -966 ml   Filed Weights   04/08/15 0400 04/09/15 0341 04/10/15 0329  Weight: 74.9  kg (165 lb 2 oz) 73.483 kg (162 lb) 73.483 kg (162 lb)    Exam:   General:  Awake, in nad, laying in bed  Cardiovascular: regular, s1, s2  Respiratory: normal resp effort, no wheezing  Abdomen: soft, nondistended, pos BS  Musculoskeletal: perfused, no cyanosis  Data Reviewed: Basic Metabolic Panel:  Recent Labs Lab 04/07/15 0340 04/08/15 0510 04/09/15 0414 04/10/15 0412 04/11/15 0112  NA 140 140 143 141 139  K 4.0 4.1 4.2 4.1 4.2  CL 103 104 100* 100* 99*  CO2 26 28 27 28 29   GLUCOSE 119* 114* 130* 123* 125*  BUN 31* 32* 34* 36* 34*  CREATININE 2.86* 2.90* 2.86* 2.84* 2.88*  CALCIUM 8.3* 8.6* 9.2 9.1 9.0   Liver Function Tests:  Recent Labs Lab 04/06/15 1200  AST 42*  ALT 16  ALKPHOS 65  BILITOT 0.9  PROT 5.0*  ALBUMIN 2.0*   No results for input(s): LIPASE, AMYLASE in the last 168 hours. No results for input(s): AMMONIA in the last 168 hours. CBC:  Recent Labs Lab 04/06/15 1606 04/07/15 0340  04/07/15 1831 04/08/15 0510 04/09/15 0414 04/10/15 0412 04/11/15 0112  WBC 7.3 8.3  --   --  7.2 7.6 7.3 7.3  NEUTROABS 4.2  --   --   --   --   --   --   --   HGB 6.6* 9.0*  < > 8.5* 8.4* 11.1* 10.5* 10.0*  HCT 21.6* 28.7*  < > 27.3* 26.9* 34.7* 31.8* 31.4*  MCV 87.4 87.2  --   --  87.6 87.2 88.8 89.2  PLT 171 157  --   --  150 131* 132* 121*  < > = values in this interval not displayed. Cardiac Enzymes:  Recent Labs Lab 04/06/15 2200 04/07/15 0340 04/07/15 1827 04/07/15 2235 04/08/15 0510  TROPONINI 0.19* 0.25* 0.22* 0.20* 0.17*   BNP (last 3 results)  Recent Labs  04/06/15 1200  BNP 717.5*    ProBNP (last 3 results) No results for input(s): PROBNP in the last 8760 hours.  CBG:  Recent Labs Lab 04/09/15 1138  GLUCAP 147*    Recent Results (from the past 240 hour(s))  Culture, blood (Routine X 2) w Reflex to ID Panel     Status: None (Preliminary result)   Collection Time: 04/06/15 10:00 PM  Result Value Ref Range Status    Specimen Description BLOOD LEFT HAND  Final   Special Requests IN PEDIATRIC BOTTLE 2CC  Final   Culture NO GROWTH 4 DAYS  Final   Report Status PENDING  Incomplete  Culture, blood (Routine X 2) w Reflex to ID Panel     Status: None (Preliminary result)   Collection Time: 04/06/15 10:00 PM  Result Value Ref Range Status   Specimen Description BLOOD LEFT THUMB  Final   Special Requests BOTTLES DRAWN AEROBIC ONLY 6CC  Final   Culture NO GROWTH 4 DAYS  Final   Report Status PENDING  Incomplete  MRSA PCR Screening     Status: None   Collection Time: 04/06/15 11:33 PM  Result Value Ref Range  Status   MRSA by PCR NEGATIVE NEGATIVE Final    Comment:        The GeneXpert MRSA Assay (FDA approved for NASAL specimens only), is one component of a comprehensive MRSA colonization surveillance program. It is not intended to diagnose MRSA infection nor to guide or monitor treatment for MRSA infections.      Studies: No results found.  Scheduled Meds: . ampicillin IVPB 2 gram/NS 50 mL  2 g Intravenous 3 times per day  . antiseptic oral rinse  7 mL Mouth Rinse BID  . atorvastatin  40 mg Oral q1800  . calcitRIOL  0.25 mcg Oral QODAY  . calcitRIOL  0.5 mcg Oral QODAY  . carvedilol  6.25 mg Oral BID WC  . famotidine  20 mg Oral Daily  . furosemide  40 mg Intravenous Daily  . isosorbide mononitrate  60 mg Oral Daily  . latanoprost  1 drop Both Eyes QHS  . magnesium oxide  400 mg Oral BID  . methocarbamol  500 mg Oral BID  . mycophenolate  180 mg Oral BID  . predniSONE  5 mg Oral Q breakfast  . ranolazine  500 mg Oral BID  . sodium bicarbonate  1,300 mg Oral BID  . sodium chloride flush  10-40 mL Intracatheter Q12H  . sodium chloride flush  3 mL Intravenous Q12H  . tacrolimus  2 mg Oral BID  . timolol  1 drop Both Eyes BID  . Warfarin - Pharmacist Dosing Inpatient   Does not apply q1800   Continuous Infusions:   Active Problems:   CKD (chronic kidney disease), stage IV (HCC)   HTN  (hypertension)   Chronic anticoagulation - with Coumadin   Dyslipidemia   Cardiomyopathy, ischemic-40-45%   Supratherapeutic INR   Enterococcal bacteremia   Anemia   Acute congestive heart failure ([place])   Debbie [name], [facility] Pager [phone]. If 7PM-7AM, please contact night-coverage at [email or web address], password [facility] 04/11/2015, 2:40 PM  LOS: 5 days

## 2015-04-11 NOTE — Progress Notes (Signed)
Dr. Sherrian Divers aware

## 2015-04-12 LAB — CBC
HCT: 32.4 % — ABNORMAL LOW (ref 36.0–46.0)
Hemoglobin: 10.6 g/dL — ABNORMAL LOW (ref 12.0–15.0)
MCH: 29.4 pg (ref 26.0–34.0)
MCHC: 32.7 g/dL (ref 30.0–36.0)
MCV: 89.8 fL (ref 78.0–100.0)
Platelets: 119 10*3/uL — ABNORMAL LOW (ref 150–400)
RBC: 3.61 MIL/uL — ABNORMAL LOW (ref 3.87–5.11)
RDW: 17.3 % — ABNORMAL HIGH (ref 11.5–15.5)
WBC: 8.3 10*3/uL (ref 4.0–10.5)

## 2015-04-12 LAB — BASIC METABOLIC PANEL
ANION GAP: 12 (ref 5–15)
BUN: 37 mg/dL — ABNORMAL HIGH (ref 6–20)
CHLORIDE: 101 mmol/L (ref 101–111)
CO2: 28 mmol/L (ref 22–32)
Calcium: 9.3 mg/dL (ref 8.9–10.3)
Creatinine, Ser: 2.75 mg/dL — ABNORMAL HIGH (ref 0.44–1.00)
GFR calc Af Amer: 20 mL/min — ABNORMAL LOW (ref >60)
GFR, EST NON AFRICAN AMERICAN: 17 mL/min — AB (ref >60)
GLUCOSE: 124 mg/dL — AB (ref 65–99)
POTASSIUM: 4.6 mmol/L (ref 3.5–5.1)
Sodium: 141 mmol/L (ref 135–145)

## 2015-04-12 LAB — PROTIME-INR
INR: 4.44 — AB (ref 0.00–1.49)
PROTHROMBIN TIME: 41.1 s — AB (ref 11.6–15.2)

## 2015-04-12 NOTE — Progress Notes (Signed)
[facility] PROGRESS NOTE  Debbie [name] [id number] DOB: 16-Sep-1950 DOA: 04/06/2015 PCP: No PCP Per Patient  HPI/Brief narrative 65 y.o. 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 No need to repeat at this time. -Tolerating gentle diuresis -cont to follow I/O's -Wt today 70.98kg -Wt on admit 73.4kg  Chest pain with hx of CAD -previous admissions for same. Pt not a candidate for cath given CKD/renal transplant per previous cardiology consultations. No evidence of infx etiology with normal WBC, afebrile, no cough -Trop initially rising, correlated to presenting anemia, suspect demand mismatch -continue Imdur, BB, statin -Currently chest pain free after prbc transfusion  Coagulopathy on chronic Coumadin with hx of Antiphospholipid syndrome/DVT -follow CBC -u/s RLE ordered by ED for swelling to thigh - neg for DVT -Recently given low dose of coumadin with marked rise in INR - coumadin now remains on hold -Discussed with pharmacy. Concerns for antibiotic interaction with coumadin. Given labile INR, recommend continued monitoring and when INR improves further, resume lower dose coumadin at 1mg  daily except 0.5mg  on Mon/Fri  Anemia -chronic disease with Iron studies done 01/2014. Baseline Hgb ~9.0  -No evidence of acute blood loss despite INR>10 -Initially transfused 2 units PRBCs given chest pain on presentation -Would possibly benefit from epo down the road -Patient given another 2 units of PRBC's secondary to chest pains with hgb <10. Hgb stable thus far  Enteroccoccus bacteremia secondary to septic  right shoulder -Repeat blood cultures thus far neg -continued ampicillin until 04/26/2015 per ID recommendations  CKD, stage IV -near baseline now. Monitor closely with diuresis -continue home dose bicarb and calcitrol  Hypertension -well-controlled on home BB -Monitor  Hx kidney transplant 2010 -continue chronic immunosuppression  Hx pafib -currently in NSR, rate controlled -INR presented markedly supratherapeutic, possibly secondary to interaction with antibiotics -INR has been very labile. Coumadin per above  Hx gout -holding allopurinol while diuresing with hx CKD  R hip pain - Xray of R hip done 3/17 with findings of small lytic lesion, recs for bone scan - Discussed with Radiology today with recs for focused MRI of R hip. Patient is adamant against doing MRI even with anxiolytic - SPEP without M-spike - Recommend outpatient PET scan on discharge per initial Radiology recs as pt is not agreeable to MRI here - Serum Ca unremarkable  Code Status: DNR Family Communication: Pt in room, family at bedside Disposition Plan: Possible d/c in 24hrs if INR improves to therapeutic range   Consultants:    Procedures:    Antibiotics: Anti-infectives    Start     Dose/Rate Route Frequency Ordered Stop   04/06/15 1515  ampicillin (OMNIPEN) 2 g in sodium chloride 0.9 % 50 mL IVPB     2 g 150 mL/hr over 20 Minutes Intravenous 3 times per day 04/06/15 1505     04/06/15 1230  aztreonam (AZACTAM) 2 g in dextrose 5 % 50 mL IVPB  Status:  Discontinued     2 g 100 mL/hr over 30 Minutes Intravenous  Once 04/06/15 1217 04/06/15 1227      HPI/Subjective: Patient states feeling better  Objective: Filed Vitals:   04/11/15 1414 04/12/15 0658 04/12/15 0737 04/12/15 1203  BP: 119/67  123/64 124/61 145/59  Pulse: 83 69 81 85  Temp: 98.6 F (37 C) 97.7 F (36.5 C) 97.9 F (36.6 C) 97.8 F (36.6 C)  TempSrc: Oral Oral Oral Oral  Resp:   18 18  Height:      Weight:   70.988 kg  (156 lb 8 oz)   SpO2: 100% 95% 97% 95%    Intake/Output Summary (Last 24 hours) at 04/12/15 1326 Last data filed at 04/12/15 0900  Gross per 24 hour  Intake    840 ml  Output   1151 ml  Net   -311 ml   Filed Weights   04/09/15 0341 04/10/15 0329 04/12/15 0737  Weight: 73.483 kg (162 lb) 73.483 kg (162 lb) 70.988 kg (156 lb 8 oz)    Exam:   General:  Awake, in nad, laying in bed  Cardiovascular: regular, s1, s2  Respiratory: normal resp effort, no wheezing  Abdomen: soft, nondistended, pos BS  Musculoskeletal: perfused, no cyanosis, no clubbing  Data Reviewed: Basic Metabolic Panel:  Recent Labs Lab 04/08/15 0510 04/09/15 0414 04/10/15 0412 04/11/15 0112 04/12/15 0405  NA 140 143 141 139 141  K 4.1 4.2 4.1 4.2 4.6  CL 104 100* 100* 99* 101  CO2 28 27 28 29 28   GLUCOSE 114* 130* 123* 125* 124*  BUN 32* 34* 36* 34* 37*  CREATININE 2.90* 2.86* 2.84* 2.88* 2.75*  CALCIUM 8.6* 9.2 9.1 9.0 9.3   Liver Function Tests:  Recent Labs Lab 04/06/15 1200  AST 42*  ALT 16  ALKPHOS 65  BILITOT 0.9  PROT 5.0*  ALBUMIN 2.0*   No results for input(s): LIPASE, AMYLASE in the last 168 hours. No results for input(s): AMMONIA in the last 168 hours. CBC:  Recent Labs Lab 04/06/15 1606  04/08/15 0510 04/09/15 0414 04/10/15 0412 04/11/15 0112 04/12/15 0405  WBC 7.3  < > 7.2 7.6 7.3 7.3 8.3  NEUTROABS 4.2  --   --   --   --   --   --   HGB 6.6*  < > 8.4* 11.1* 10.5* 10.0* 10.6*  HCT 21.6*  < > 26.9* 34.7* 31.8* 31.4* 32.4*  MCV 87.4  < > 87.6 87.2 88.8 89.2 89.8  PLT 171  < > 150 131* 132* 121* 119*  < > = values in this interval not displayed. Cardiac Enzymes:  Recent Labs Lab 04/06/15 2200 04/07/15 0340 04/07/15 1827 04/07/15 2235 04/08/15 0510  TROPONINI 0.19* 0.25* 0.22* 0.20* 0.17*   BNP (last 3 results)  Recent Labs  04/06/15 1200  BNP 717.5*    ProBNP (last 3 results) No results for input(s): PROBNP in the last 8760  hours.  CBG:  Recent Labs Lab 04/09/15 1138  GLUCAP 147*    Recent Results (from the past 240 hour(s))  Culture, blood (Routine X 2) w Reflex to ID Panel     Status: None   Collection Time: 04/06/15 10:00 PM  Result Value Ref Range Status   Specimen Description BLOOD LEFT HAND  Final   Special Requests IN PEDIATRIC BOTTLE 2CC  Final   Culture NO GROWTH 5 DAYS  Final   Report Status 04/11/2015 FINAL  Final  Culture, blood (Routine X 2) w Reflex to ID Panel     Status: None   Collection Time: 04/06/15 10:00 PM  Result Value Ref Range Status   Specimen Description BLOOD LEFT THUMB  Final   Special Requests BOTTLES DRAWN AEROBIC ONLY [place]  Final   Culture NO  GROWTH 5 DAYS  Final   Report Status 04/11/2015 FINAL  Final  MRSA PCR Screening     Status: None   Collection Time: 04/06/15 11:33 PM  Result Value Ref Range Status   MRSA by PCR NEGATIVE NEGATIVE Final    Comment:        The GeneXpert MRSA Assay (FDA approved for NASAL specimens only), is one component of a comprehensive MRSA colonization surveillance program. It is not intended to diagnose MRSA infection nor to guide or monitor treatment for MRSA infections.      Studies: No results found.  Scheduled Meds: . ampicillin IVPB 2 gram/NS 50 mL  2 g Intravenous 3 times per day  . antiseptic oral rinse  7 mL Mouth Rinse BID  . atorvastatin  40 mg Oral q1800  . calcitRIOL  0.25 mcg Oral QODAY  . calcitRIOL  0.5 mcg Oral QODAY  . carvedilol  6.25 mg Oral BID WC  . famotidine  20 mg Oral Daily  . furosemide  40 mg Intravenous Daily  . isosorbide mononitrate  60 mg Oral Daily  . latanoprost  1 drop Both Eyes QHS  . magnesium oxide  400 mg Oral BID  . methocarbamol  500 mg Oral BID  . mycophenolate  180 mg Oral BID  . predniSONE  5 mg Oral Q breakfast  . ranolazine  500 mg Oral BID  . sodium bicarbonate  1,300 mg Oral BID  . sodium chloride flush  10-40 mL Intracatheter Q12H  . sodium chloride flush  3 mL  Intravenous Q12H  . tacrolimus  2 mg Oral BID  . timolol  1 drop Both Eyes BID  . Warfarin - Pharmacist Dosing Inpatient   Does not apply q1800   Continuous Infusions:   Active Problems:   CKD (chronic kidney disease), stage IV (HCC)   HTN (hypertension)   Chronic anticoagulation - with Coumadin   Dyslipidemia   Cardiomyopathy, ischemic-40-45%   Supratherapeutic INR   Enterococcal bacteremia   Anemia   Acute congestive heart failure ([place])   [facility], [facility] Pager [phone]. If 7PM-7AM, please contact night-coverage at [email or web address], password [facility] 04/12/2015, 1:26 PM  LOS: 6 days

## 2015-04-12 NOTE — Progress Notes (Signed)
ANTICOAGULATION CONSULT NOTE - Follow Up Consult  Pharmacy Consult for warfarin Indication: atrial fibrillation and antiphospholipid antibody syndrome, Hx of DVT  Allergies  Allergen Reactions  . Feraheme [Ferumoxytol] Other (See Comments)    Chest pain, pulsating 02/17/15: tolerated Nulecit  . Vancomycin Anaphylaxis, Itching, Swelling and Other (See Comments)    Tongue swell  . Dorzolamide Hcl-Timolol Mal Rash and Other (See Comments)    Eye pain  . Gentamycin [Gentamicin Sulfate] Itching and Swelling  . Latanoprost Rash and Other (See Comments)    Eye pain  . Cefazolin Itching  . Codeine Itching  . Hydrocodone-Acetaminophen Itching  . Penicillins Itching    *tolerated ampicillin during 02/2015 admission Has patient had a PCN reaction causing immediate rash, facial/tongue/throat swelling, SOB or lightheadedness with hypotension:No Has patient had a PCN reaction causing severe rash involving mucus membranes or skin necrosis:No Has patient had a PCN reaction that required hospitalization:No Has patient had a PCN reaction occurring within the last 10 years:No If all of the above answers are "NO", then may proceed with Cephalosporin use.     Patient Measurements: Height: 5\' 1"  (154.9 cm) Weight: 156 lb 8 oz (70.988 kg) (bed scale with all but one pillow on and blanket) IBW/kg (Calculated) : 47.8  Vital Signs: Temp: 97.9 F (36.6 C) (03/21 0737) Temp Source: Oral (03/21 0737) BP: 124/61 mmHg (03/21 0737) Pulse Rate: 81 (03/21 0737)  Labs:  Recent Labs  04/10/15 0412 04/11/15 0112 04/12/15 0405  HGB 10.5* 10.0* 10.6*  HCT 31.8* 31.4* 32.4*  PLT 132* 121* 119*  LABPROT 44.5* 41.8* 41.1*  INR 4.94* 4.55* 4.44*  CREATININE 2.84* 2.88* 2.75*    Estimated Creatinine Clearance: 18.4 mL/min (by C-G formula based on Cr of 2.75).   Assessment: 65 yo F admitted 04/06/2015 on warfarin PTA for antiphospholipid antibody syndrome, Hx of DVT and Afib. On admission patient with  INR > 10 without active bleeding. Patient given vit K 5 mg x1 on 3/15 and received 3 units RBCs this admission.  Pharmacy consulted to dose warfarin. She also continues on ampicillin (major DDI - ampicillin can increase INR, will dose cautiously)  PTA warfarin dose: Takes 2.5 mg on Mon and Fri, 1.25 mg all other days  INR 4.44 (supratherapeutic, despite holding 3 doses), Hgb stable, Pltc trending down (?cause), no bleeding noted.  Goal of Therapy:  INR 2-3 Monitor platelets by anticoagulation protocol: Yes   Plan:  - Hold warfarin tonight - would resume at lower dose as INR trends down - Consider 50% decrease in weekly regimen: 1 mg daily except 0.5 mg Mon/Fri - Monitor daily INR, CBC and s/sx bleeding  [facility], [place].D., BCPS Clinical Pharmacist Pager: [phone] 04/12/2015 11:30 AM

## 2015-04-12 NOTE — Progress Notes (Signed)
Pt a/o,  c/o pain, PRN meds given as ordered, VSS, pt stable

## 2015-04-13 DIAGNOSIS — R791 Abnormal coagulation profile: Secondary | ICD-10-CM

## 2015-04-13 DIAGNOSIS — R7881 Bacteremia: Secondary | ICD-10-CM

## 2015-04-13 DIAGNOSIS — B952 Enterococcus as the cause of diseases classified elsewhere: Secondary | ICD-10-CM

## 2015-04-13 DIAGNOSIS — I5031 Acute diastolic (congestive) heart failure: Secondary | ICD-10-CM

## 2015-04-13 DIAGNOSIS — D649 Anemia, unspecified: Secondary | ICD-10-CM

## 2015-04-13 DIAGNOSIS — N184 Chronic kidney disease, stage 4 (severe): Secondary | ICD-10-CM

## 2015-04-13 LAB — BASIC METABOLIC PANEL
Anion gap: 11 (ref 5–15)
BUN: 43 mg/dL — AB (ref 6–20)
CO2: 27 mmol/L (ref 22–32)
Calcium: 9.2 mg/dL (ref 8.9–10.3)
Chloride: 101 mmol/L (ref 101–111)
Creatinine, Ser: 2.69 mg/dL — ABNORMAL HIGH (ref 0.44–1.00)
GFR calc Af Amer: 20 mL/min — ABNORMAL LOW (ref >60)
GFR, EST NON AFRICAN AMERICAN: 17 mL/min — AB (ref >60)
GLUCOSE: 111 mg/dL — AB (ref 65–99)
POTASSIUM: 4.4 mmol/L (ref 3.5–5.1)
Sodium: 139 mmol/L (ref 135–145)

## 2015-04-13 LAB — CBC
HEMATOCRIT: 31.6 % — AB (ref 36.0–46.0)
Hemoglobin: 9.8 g/dL — ABNORMAL LOW (ref 12.0–15.0)
MCH: 27.8 pg (ref 26.0–34.0)
MCHC: 31 g/dL (ref 30.0–36.0)
MCV: 89.5 fL (ref 78.0–100.0)
PLATELETS: 109 10*3/uL — AB (ref 150–400)
RBC: 3.53 MIL/uL — AB (ref 3.87–5.11)
RDW: 17.2 % — ABNORMAL HIGH (ref 11.5–15.5)
WBC: 7.7 10*3/uL (ref 4.0–10.5)

## 2015-04-13 LAB — PROTIME-INR
INR: 4.34 — ABNORMAL HIGH (ref 0.00–1.49)
Prothrombin Time: 40.4 seconds — ABNORMAL HIGH (ref 11.6–15.2)

## 2015-04-13 MED ORDER — FUROSEMIDE 40 MG PO TABS
40.0000 mg | ORAL_TABLET | Freq: Every morning | ORAL | Status: DC
  Administered 2015-04-13: 40 mg via ORAL
  Filled 2015-04-13: qty 1

## 2015-04-13 MED ORDER — FUROSEMIDE 20 MG PO TABS: 20.0000 mg | ORAL_TABLET | Freq: Every evening | ORAL | Status: DC

## 2015-04-13 MED ORDER — FUROSEMIDE 40 MG PO TABS
40.0000 mg | ORAL_TABLET | Freq: Every evening | ORAL | Status: DC
  Administered 2015-04-13: 40 mg via ORAL
  Filled 2015-04-13: qty 1

## 2015-04-13 NOTE — Consult Note (Signed)
WOC wound consult note Reason for Consult: re-consulted for pressure injuries Just evaluated pt on 04/10/15.   MD has ordered DC of FC, wounds are clean at this time and superficial but full thickness  Wound type:MASD (moisture associated skin damage) Dressing procedure/placement/frequency: Will DC foam dressings since we are getting rid of Foley, needs barrier cream to protect area from insult of incontinence.   LALM will aid in moisture control.  Discussed POC with patient and bedside nurse.  Re consult if needed, will not follow at this time. Thanks  Boss Danielsen Kellogg, [place] [phone])

## 2015-04-13 NOTE — Progress Notes (Signed)
PT Cancellation Note  Patient Details Name: Debbie [name] MRN: [id number] DOB: [birth year]   Cancelled Treatment:    Reason Eval/Treat Not Completed: Patient declined, no reason specified   Duncan Dull 04/13/2015, 3:45 PM Alben Deeds, [place] DPT  [phone]

## 2015-04-13 NOTE — Progress Notes (Signed)
ANTICOAGULATION CONSULT NOTE - Follow Up Consult  Pharmacy Consult for warfarin Indication: atrial fibrillation and antiphospholipid antibody syndrome, Hx of DVT  Allergies  Allergen Reactions  . Feraheme [Ferumoxytol] Other (See Comments)    Chest pain, pulsating 02/17/15: tolerated Nulecit  . Vancomycin Anaphylaxis, Itching, Swelling and Other (See Comments)    Tongue swell  . Dorzolamide Hcl-Timolol Mal Rash and Other (See Comments)    Eye pain  . Gentamycin [Gentamicin Sulfate] Itching and Swelling  . Latanoprost Rash and Other (See Comments)    Eye pain  . Cefazolin Itching  . Codeine Itching  . Hydrocodone-Acetaminophen Itching  . Penicillins Itching    *tolerated ampicillin during 02/2015 admission Has patient had a PCN reaction causing immediate rash, facial/tongue/throat swelling, SOB or lightheadedness with hypotension:No Has patient had a PCN reaction causing severe rash involving mucus membranes or skin necrosis:No Has patient had a PCN reaction that required hospitalization:No Has patient had a PCN reaction occurring within the last 10 years:No If all of the above answers are "NO", then may proceed with Cephalosporin use.     Patient Measurements: Height: 5\' 1"  (154.9 cm) Weight: 155 lb (70.308 kg) (bedscale) IBW/kg (Calculated) : 47.8  Vital Signs: Temp: 98.1 F (36.7 C) (03/22 0627) Temp Source: Oral (03/22 0627) BP: 144/61 mmHg (03/22 0627) Pulse Rate: 84 (03/22 0627)  Labs:  Recent Labs  04/11/15 0112 04/12/15 0405 04/13/15 0406  HGB 10.0* 10.6* 9.8*  HCT 31.4* 32.4* 31.6*  PLT 121* 119* 109*  LABPROT 41.8* 41.1* 40.4*  INR 4.55* 4.44* 4.34*  CREATININE 2.88* 2.75* 2.69*    Estimated Creatinine Clearance: 18.7 mL/min (by C-G formula based on Cr of 2.69).   Assessment: 65 yo F admitted 04/06/2015 on warfarin PTA for antiphospholipid antibody syndrome, Hx of DVT and Afib. On admission patient with INR > 10 without active bleeding. Patient given  vit K 5 mg x1 on 3/15 and received 3 units RBCs so far this admission.  Pharmacy consulted to dose warfarin. She also continues on ampicillin (major DDI - ampicillin can increase INR)  PTA warfarin dose: Takes 2.5 mg on Mon and Fri, 1.25 mg all other days  INR 4.34- remains high despite holding doses. Hgb and plts both dropped slightly. No bleeding noted.  Goal of Therapy:  INR 2-3 Monitor platelets by anticoagulation protocol: Yes   Plan:  - Hold warfarin tonight - Consider 50% decrease in weekly regimen: 1 mg daily except 0.5 mg Mon/Fri- this is also in Dr. Rhetta Mura note from yesterday - Monitor daily INR, CBC and s/sx bleeding  Ayaat Jansma D. Eilis Chestnutt, PharmD, BCPS Clinical Pharmacist Pager: [phone] 04/13/2015 10:35 AM

## 2015-04-13 NOTE — Progress Notes (Signed)
PATIENT DETAILS Name: SHIARA CLOYD Age: 65 y.o. Sex: female Date of Birth: [birth year] Admit Date: 04/06/2015 Admitting Physician Waldemar Dickens, MD PCP:No PCP Per Patient  Subjective: Feels much better-no shortness of breath, lower extremity edema has resolved.  Assessment/Plan: Active Problems:  Acute on chronic diastolic heart failure: Much more compensated, -3.7 L so far, weight decreased to 155 pounds (166 pounds on admission). Leg edema has completely resolved. Change to oral Lasix, if clinically improvement continues, suspect stable to be discharged back to SNF tomorrow.  History of chest pain with minimally elevated troponins: Trend is flat not consistent with ACS. Note, patient has known history of CAD-has had numerous prior cardiology evaluations in the past. Given CKD stage IV, LHC has not been done. No further chest pain since admission, discussion with patient today-she prefers to be managed medically-and does not want any further investigation at this time. Continue-statin, Coreg. Not on an as on anticoagulation with mostly supratherapeutic and very labile INR.  Coagulopathy: On chronic Coumadin, INR extremely labile and still supratherapeutic and likely due to ampicillin. Pharmacy dosing while inpatient. While patient is on ampicillin, once Coumadin is resumed-will need to be resumed at a much lower dose. Will require more frequent INR checks when at SNF.  Hx of Antiphospholipid syndrome/DVT: On chronic Coumadin. See above.  Hx of Right fibular and medial malleolus fracture: Followed by Dr. Sherlyn Hay chart review-it appears that given patient's prior cardiac issues patient was not considered a candidate for operative correction.  Hypertension: Controlled, continue Coreg, Imdur  Dyslipidemia:continue Statin  Anemia: Likely secondary to chronic kidney disease. Hemoglobin currently stable. Was transfused PRBC this admission.  Enterococcus bacteremia  with septic arthritis involving the right shoulder: She was most recently hospitalized from 2/19-3/6, during that hospitalization she underwent a TEE which was negative for vegetations. She refused incision and drainage of right shoulder. Infectious disease, recommended ampicillin through 04/26/15.  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 Continue Prograf, prednisone and mycophenolate. Stable for outpatient follow-up with her primary nephrologist.  R hip pain: Currently stable, x-ray of the right hip on 3/17 showed a small lytic lesion. Patient refused a MRI of the right hip. SPEP without M spike. Plans are for outpatient PET scan  Disposition: Remain inpatient-SNF tomorrow.  Antimicrobial agents  See below  Anti-infectives    Start     Dose/Rate Route Frequency Ordered Stop   04/06/15 1515  ampicillin (OMNIPEN) 2 g in sodium chloride 0.9 % 50 mL IVPB     2 g 150 mL/hr over 20 Minutes Intravenous 3 times per day 04/06/15 1505     04/06/15 1230  aztreonam (AZACTAM) 2 g in dextrose 5 % 50 mL IVPB  Status:  Discontinued     2 g 100 mL/hr over 30 Minutes Intravenous  Once 04/06/15 1217 04/06/15 1227      DVT Prophylaxis: Coumadin with supratherapeutic INR  Code Status: DNR  Family Communication Friend at bedside-spoke with her after patients consent  Procedures: None  CONSULTS:  None  Time spent 30 minutes-Greater than 50% of this time was spent in counseling, explanation of diagnosis, planning of further management, and coordination of care.  MEDICATIONS: Scheduled Meds: . ampicillin IVPB 2 gram/NS 50 mL  2 g Intravenous 3 times per day  . antiseptic oral rinse  7 mL Mouth Rinse BID  . atorvastatin  40 mg Oral q1800  . calcitRIOL  0.25 mcg Oral QODAY  . calcitRIOL  0.5 mcg Oral QODAY  . carvedilol  6.25 mg Oral BID WC  . famotidine  20 mg Oral Daily  . furosemide  20 mg Oral QPM  . furosemide  40  mg Oral q morning - 10a  . isosorbide mononitrate  60 mg Oral Daily  . latanoprost  1 drop Both Eyes QHS  . magnesium oxide  400 mg Oral BID  . methocarbamol  500 mg Oral BID  . mycophenolate  180 mg Oral BID  . predniSONE  5 mg Oral Q breakfast  . ranolazine  500 mg Oral BID  . sodium bicarbonate  1,300 mg Oral BID  . sodium chloride flush  10-40 mL Intracatheter Q12H  . sodium chloride flush  3 mL Intravenous Q12H  . tacrolimus  2 mg Oral BID  . timolol  1 drop Both Eyes BID  . Warfarin - Pharmacist Dosing Inpatient   Does not apply q1800   Continuous Infusions:  PRN Meds:.sodium chloride, acetaminophen, HYDROmorphone (DILAUDID) injection, nitroGLYCERIN, ondansetron (ZOFRAN) IV, oxyCODONE-acetaminophen, sodium chloride flush, sodium chloride flush    PHYSICAL EXAM: Vital signs in last 24 hours: Filed Vitals:   04/12/15 1203 04/12/15 2043 04/13/15 0627 04/13/15 1215  BP: 145/59 156/66 144/61 131/61  Pulse: 85 96 84 81  Temp: 97.8 F (36.6 C) 98.2 F (36.8 C) 98.1 F (36.7 C) 98.7 F (37.1 C)  TempSrc: Oral Oral Oral Oral  Resp: 18 20 16 18   Height:      Weight:   70.308 kg (155 lb)   SpO2: 95% 99% 100% 100%    Weight change:  Filed Weights   04/10/15 0329 04/12/15 0737 04/13/15 0627  Weight: 73.483 kg (162 lb) 70.988 kg (156 lb 8 oz) 70.308 kg (155 lb)   Body mass index is 29.3 kg/(m^2).   Gen Exam: Awake and alert with clear speech.   Neck: Supple, No JVD.   Chest: B/L Clear.   CVS: S1 S2 Regular, no murmurs.  Abdomen: soft, BS +, non tender, non distended.  Extremities: no edema, lower extremities warm to touch.  Intake/Output from previous day:  Intake/Output Summary (Last 24 hours) at 04/13/15 1420 Last data filed at 04/13/15 0915  Gross per 24 hour  Intake    420 ml  Output    625 ml  Net   -205 ml     LAB RESULTS: CBC  Recent Labs Lab 04/06/15 1606  04/09/15 0414 04/10/15 0412 04/11/15 0112 04/12/15 0405 04/13/15 0406  WBC 7.3  < > 7.6  7.3 7.3 8.3 7.7  HGB 6.6*  < > 11.1* 10.5* 10.0* 10.6* 9.8*  HCT 21.6*  < > 34.7* 31.8* 31.4* 32.4* 31.6*  PLT 171  < > 131* 132* 121* 119* 109*  MCV 87.4  < > 87.2 88.8 89.2 89.8 89.5  MCH 26.7  < > 27.9 29.3 28.4 29.4 27.8  MCHC 30.6  < > 32.0 33.0 31.8 32.7 31.0  RDW 17.4*  < > 16.4* 17.1* 17.1* 17.3* 17.2*  LYMPHSABS 2.2  --   --   --   --   --   --   MONOABS 0.7  --   --   --   --   --   --   EOSABS 0.2  --   --   --   --   --   --   BASOSABS 0.0  --   --   --   --   --   --   < > =  values in this interval not displayed.  Chemistries   Recent Labs Lab 04/09/15 0414 04/10/15 0412 04/11/15 0112 04/12/15 0405 04/13/15 0406  NA 143 141 139 141 139  K 4.2 4.1 4.2 4.6 4.4  CL 100* 100* 99* 101 101  CO2 27 28 29 28 27   GLUCOSE 130* 123* 125* 124* 111*  BUN 34* 36* 34* 37* 43*  CREATININE 2.86* 2.84* 2.88* 2.75* 2.69*  CALCIUM 9.2 9.1 9.0 9.3 9.2    CBG:  Recent Labs Lab 04/09/15 1138  GLUCAP 147*    GFR Estimated Creatinine Clearance: 18.7 mL/min (by C-G formula based on Cr of 2.69).  Coagulation profile  Recent Labs Lab 04/09/15 0414 04/10/15 0412 04/11/15 0112 04/12/15 0405 04/13/15 0406  INR 3.58* 4.94* 4.55* 4.44* 4.34*    Cardiac Enzymes  Recent Labs Lab 04/07/15 1827 04/07/15 2235 04/08/15 0510  TROPONINI 0.22* 0.20* 0.17*    Invalid input(s): POCBNP No results for input(s): DDIMER in the last 72 hours. No results for input(s): HGBA1C in the last 72 hours. No results for input(s): CHOL, HDL, LDLCALC, TRIG, CHOLHDL, LDLDIRECT in the last 72 hours. No results for input(s): TSH, T4TOTAL, T3FREE, THYROIDAB in the last 72 hours.  Invalid input(s): FREET3 No results for input(s): VITAMINB12, FOLATE, FERRITIN, TIBC, IRON, RETICCTPCT in the last 72 hours. No results for input(s): LIPASE, AMYLASE in the last 72 hours.  Urine Studies No results for input(s): UHGB, CRYS in the last 72 hours.  Invalid input(s): UACOL, UAPR, USPG, UPH, UTP, UGL,  UKET, UBIL, UNIT, UROB, ULEU, UEPI, UWBC, URBC, UBAC, CAST, UCOM, BILUA  MICROBIOLOGY: Recent Results (from the past 240 hour(s))  Culture, blood (Routine X 2) w Reflex to ID Panel     Status: None   Collection Time: 04/06/15 10:00 PM  Result Value Ref Range Status   Specimen Description BLOOD LEFT HAND  Final   Special Requests IN PEDIATRIC BOTTLE 2CC  Final   Culture NO GROWTH 5 DAYS  Final   Report Status 04/11/2015 FINAL  Final  Culture, blood (Routine X 2) w Reflex to ID Panel     Status: None   Collection Time: 04/06/15 10:00 PM  Result Value Ref Range Status   Specimen Description BLOOD LEFT THUMB  Final   Special Requests BOTTLES DRAWN AEROBIC ONLY [place]  Final   Culture NO GROWTH 5 DAYS  Final   Report Status 04/11/2015 FINAL  Final  MRSA PCR Screening     Status: None   Collection Time: 04/06/15 11:33 PM  Result Value Ref Range Status   MRSA by PCR NEGATIVE NEGATIVE Final    Comment:        The GeneXpert MRSA Assay (FDA approved for NASAL specimens only), is one component of a comprehensive MRSA colonization surveillance program. It is not intended to diagnose MRSA infection nor to guide or monitor treatment for MRSA infections.     RADIOLOGY STUDIES/RESULTS: Ct Humerus Right Wo Contrast  03/18/2015  CLINICAL DATA:  [place] 3-4 days ago.  Injured right upper extremity. EXAM: CT OF THE RIGHT SHOULDER WITHOUT CONTRAST; CT OF THE RIGHT HUMERUS WITHOUT CONTRAST; CT OF THE RIGHT FOREARM WITHOUT CONTRAST TECHNIQUE: Multidetector CT imaging was performed according to the standard protocol. Multiplanar CT image reconstructions were also generated. COMPARISON:  None. FINDINGS: No acute fractures are identified. The shoulder joint is maintained. No fracture or dislocation. Mild AC joint degenerative changes. No AC joint separation. No humeral fracture. There is extensive subacromial/subdeltoid and subscapularis bursitis. There is also  a large glenohumeral joint effusion and severe  synovitis. Dialysis grafts are noted in the right upper extremity. The elbow joint is maintained. No elbow fracture or obvious joint effusion. Headache cardiac No forearm fractures are identified. There is a moderate-sized right pleural effusion with overlying atelectasis. No obvious acute right rib fractures are identified. The thoracic vertebral bodies are intact. Moderate degenerative changes noted at the sternoclavicular joints. Small scarred right kidney is noted. There is advanced atherosclerotic calcifications involving the aorta and branch vessels. IMPRESSION: No acute fractures of the right upper extremity are identified. The joints are maintained. Extensive synovitis, joint effusion and bursitis involving the right shoulder. Electronically Signed   By: Marijo Sanes M.D.   On: 03/18/2015 09:11   Ct Shoulder Right Wo Contrast  03/18/2015  CLINICAL DATA:  [place] 3-4 days ago.  Injured right upper extremity. EXAM: CT OF THE RIGHT SHOULDER WITHOUT CONTRAST; CT OF THE RIGHT HUMERUS WITHOUT CONTRAST; CT OF THE RIGHT FOREARM WITHOUT CONTRAST TECHNIQUE: Multidetector CT imaging was performed according to the standard protocol. Multiplanar CT image reconstructions were also generated. COMPARISON:  None. FINDINGS: No acute fractures are identified. The shoulder joint is maintained. No fracture or dislocation. Mild AC joint degenerative changes. No AC joint separation. No humeral fracture. There is extensive subacromial/subdeltoid and subscapularis bursitis. There is also a large glenohumeral joint effusion and severe synovitis. Dialysis grafts are noted in the right upper extremity. The elbow joint is maintained. No elbow fracture or obvious joint effusion. Headache cardiac No forearm fractures are identified. There is a moderate-sized right pleural effusion with overlying atelectasis. No obvious acute right rib fractures are identified. The thoracic vertebral bodies are intact. Moderate degenerative changes noted  at the sternoclavicular joints. Small scarred right kidney is noted. There is advanced atherosclerotic calcifications involving the aorta and branch vessels. IMPRESSION: No acute fractures of the right upper extremity are identified. The joints are maintained. Extensive synovitis, joint effusion and bursitis involving the right shoulder. Electronically Signed   By: Marijo Sanes M.D.   On: 03/18/2015 09:11   Ct Forearm Right Wo Contrast  03/18/2015  CLINICAL DATA:  [place] 3-4 days ago.  Injured right upper extremity. EXAM: CT OF THE RIGHT SHOULDER WITHOUT CONTRAST; CT OF THE RIGHT HUMERUS WITHOUT CONTRAST; CT OF THE RIGHT FOREARM WITHOUT CONTRAST TECHNIQUE: Multidetector CT imaging was performed according to the standard protocol. Multiplanar CT image reconstructions were also generated. COMPARISON:  None. FINDINGS: No acute fractures are identified. The shoulder joint is maintained. No fracture or dislocation. Mild AC joint degenerative changes. No AC joint separation. No humeral fracture. There is extensive subacromial/subdeltoid and subscapularis bursitis. There is also a large glenohumeral joint effusion and severe synovitis. Dialysis grafts are noted in the right upper extremity. The elbow joint is maintained. No elbow fracture or obvious joint effusion. Headache cardiac No forearm fractures are identified. There is a moderate-sized right pleural effusion with overlying atelectasis. No obvious acute right rib fractures are identified. The thoracic vertebral bodies are intact. Moderate degenerative changes noted at the sternoclavicular joints. Small scarred right kidney is noted. There is advanced atherosclerotic calcifications involving the aorta and branch vessels. IMPRESSION: No acute fractures of the right upper extremity are identified. The joints are maintained. Extensive synovitis, joint effusion and bursitis involving the right shoulder. Electronically Signed   By: Marijo Sanes M.D.   On: 03/18/2015  09:11   US Renal  03/15/2015  CLINICAL DATA:  Acute renal failure superimposed on stage 4 chronic kidney disease. EXAM:  RENAL / URINARY TRACT ULTRASOUND COMPLETE COMPARISON:  Included portions from chest CT 02/18/2015 FINDINGS: Right Kidney: Not visualized. Left Kidney: Not visualized. Transplant kidney located in the pelvis, side not specified. There is no transplant hydronephrosis. Blood flow seen. Transplant kidney measures 10 cm. Transplant cortical thickness appears maintained. No focal lesion. Detailed transplant Doppler evaluation not performed. Bladder: Appears normal for degree of bladder distention. IMPRESSION: 1. Native kidneys not identified, likely secondary renal atrophy. 2. Pelvic transplant kidney without hydronephrosis. Electronically Signed   By: Jeb Levering M.D.   On: 03/15/2015 23:08   Dg Chest Port 1 View  04/07/2015  CLINICAL DATA:  Congestive heart failure EXAM: PORTABLE CHEST 1 VIEW COMPARISON:  04/06/2015 FINDINGS: Congestive heart failure with pulmonary edema shows mild interval improvement. Small pleural effusions bilaterally. Central venous catheter tip in the SVC unchanged. IMPRESSION: Mild improvement in pulmonary edema. Electronically Signed   By: Franchot Gallo M.D.   On: 04/07/2015 07:28   Dg Chest Port 1 View  04/06/2015  CLINICAL DATA:  Chest pain EXAM: PORTABLE CHEST 1 VIEW COMPARISON:  March 26, 2015 FINDINGS: There is extensive airspace consolidation throughout the lungs bilaterally, more pronounced on the left than on the right. There is underlying interstitial edema. There is mild cardiomegaly with mild pulmonary venous hypertension. Central catheter tip is at the junction of the left innominate vein and superior vena cava. No pneumothorax. There is arthropathy in both shoulders. IMPRESSION: Widespread airspace consolidation bilaterally, more on the left than on the right, progressed from most recent prior study. Underlying interstitial edema. Suspect widespread  pneumonia superimposed on congestive heart failure. Aspiration could present in this manner as well. No pneumothorax evident. Electronically Signed   By: Lowella Grip III M.D.   On: 04/06/2015 12:03   Dg Chest Port 1 View  03/26/2015  CLINICAL DATA:  Sepsis.  Pneumonia. EXAM: PORTABLE CHEST 1 VIEW COMPARISON:  March 25, 2015 FINDINGS: No pneumothorax. Diffuse left lung infiltrate is again identified, obscuring the left heart border, similar in the interval given difference in technique. No other changes. Stable left IJ. IMPRESSION: Stable diffuse left lung infiltrate.  No other changes. Electronically Signed   By: Dorise Bullion III M.D   On: 03/26/2015 09:26   Dg Chest Port 1 View  03/25/2015  CLINICAL DATA:  Central line placement. EXAM: PORTABLE CHEST 1 VIEW COMPARISON:  03/25/2015. FINDINGS: Left IJ central line has been slightly retracted, its tip is at the level of the superior vena cava. Heart size stable. Persistent left lung diffuse infiltrate. Small left pleural effusion. No pneumothorax. IMPRESSION: 1. Left IJ line has been partially retracted, its tip is at the level of the superior vena cava. No pneumothorax. 2.  Diffuse left lung infiltrate again noted. Electronically Signed   By: Marcello Moores  Register   On: 03/25/2015 17:01   Dg Chest Port 1 View  03/25/2015  CLINICAL DATA:  Central line placement EXAM: PORTABLE CHEST 1 VIEW COMPARISON:  03/14/2015 FINDINGS: Left internal jugular central line has been placed and crosses midline to the right side with tip over the anticipated position of the brachiocephalic vein. No pneumothorax. Increased opacity throughout the lingula IMPRESSION: Central line projects with tip over the brachiocephalic vein on the right and should be retracted about 5 cm and re- advanced with repeat imaging. Lingular opacity Critical Value/emergent results were called by telephone at the time of interpretation on 03/25/2015 at 4:15 pm to Sybil, the patient's nurse, who verbally  acknowledged these results. Electronically Signed  By: Skipper Cliche M.D.   On: 03/25/2015 16:17   Dg Fluoro Guided Needle Plc Aspiration/injection Loc  03/21/2015  CLINICAL DATA:  Sinusitis, joint effusion and bursitis involving the right shoulder on CT 03/17/2015. EXAM: RIGHT SHOULDER ASPIRATION UNDER FLUOROSCOPY TECHNIQUE: Despite an INR of 3.99, Dr. Meridee Score insisted on a relatively emergent right shoulder aspiration due to the possibility of sepsis. The increased risk of bleeding was discussed with the patient prior to the procedure, as well as the risk of infection. Patient elected to proceed with the procedure. Informed consent was obtained. An appropriate skin entrance site was determined. The site was marked, prepped with Betadine, draped in the usual sterile fashion, and infiltrated locally with buffered Lidocaine. Based on CT imaging, 80 20 gauge spinal needle was inserted over the mid humeral head and 5-60 cc of serosanguineous fluid returned. No immediate complication. FLUOROSCOPY TIME:  Radiation Exposure Index (as provided by the fluoroscopic device): If the device does not provide the exposure index: Fluoroscopy Time (in minutes and seconds):  0 minutes 24 seconds. Number of Acquired Images:  None. FINDINGS: Despite an INR of 3.99, Dr. Meridee Score insisted on a relatively emergent right shoulder aspiration due to the possibility of sepsis. The increased risk of bleeding was discussed with the patient prior to the procedure, as well as the risk of infection. Patient elected to proceed with the procedure. Informed consent was obtained. An appropriate skin entrance site was determined. The site was marked, prepped with Betadine, draped in the usual sterile fashion, and infiltrated locally with buffered Lidocaine. Based on CT imaging, a 20 gauge spinal needle was inserted over the mid humeral head with return of 5-6 cc of serosanguineous fluid. No immediate complication. IMPRESSION: Technically  successful right shoulder aspiration. Electronically Signed   By: Lorin Picket M.D.   On: 03/21/2015 15:16   Dg Hip Unilat With Pelvis 2-3 Views Right  04/08/2015  CLINICAL DATA:  RIGHT hip pain, fell in bathroom, was unable to move RIGHT leg, history hypertension, end-stage renal disease post renal transplant EXAM: DG HIP (WITH OR WITHOUT PELVIS) 2-3V RIGHT COMPARISON:  None FINDINGS: Diffuse osseous demineralization. Minimal narrowing of both hip joints. SI joint spaces symmetric and preserved. No acute fracture dislocation. Small lytic lesion at the proximal RIGHT femoral metaphysis 16 x 8 mm, nonspecific. Extensive atherosclerotic calcifications. IMPRESSION: No definite acute fracture dislocation. Lytic lesion at proximal RIGHT femur, nonspecific; this could be seen with myeloma, lytic metastasis, brown tumor in the setting of chronic renal failure. Stability is uncertain due to the lack of prior studies. Consider nonemergent follow-up bone survey to assess for additional lesions. Electronically Signed   By: Lavonia Dana M.D.   On: 04/08/2015 16:48    Oren Binet, MD  [facility] Pager:[phone]  If 7PM-7AM, please contact night-coverage [email or web address] Password [email or web address] 04/13/2015, 2:20 PM   LOS: 7 days

## 2015-04-13 NOTE — Progress Notes (Signed)
Pt a/o, no c/o pain, pt will have Foley removed after she eats and will take off mepilex and apply barrier cream to wounds

## 2015-04-14 ENCOUNTER — Inpatient Hospital Stay (HOSPITAL_COMMUNITY): Payer: Medicare Other

## 2015-04-14 ENCOUNTER — Ambulatory Visit: Payer: Self-pay | Admitting: Licensed Clinical Social Worker

## 2015-04-14 DIAGNOSIS — I10 Essential (primary) hypertension: Secondary | ICD-10-CM

## 2015-04-14 LAB — BASIC METABOLIC PANEL
Anion gap: 8 (ref 5–15)
BUN: 41 mg/dL — AB (ref 6–20)
CHLORIDE: 101 mmol/L (ref 101–111)
CO2: 28 mmol/L (ref 22–32)
Calcium: 8.8 mg/dL — ABNORMAL LOW (ref 8.9–10.3)
Creatinine, Ser: 2.62 mg/dL — ABNORMAL HIGH (ref 0.44–1.00)
GFR calc Af Amer: 21 mL/min — ABNORMAL LOW (ref >60)
GFR calc non Af Amer: 18 mL/min — ABNORMAL LOW (ref >60)
Glucose, Bld: 106 mg/dL — ABNORMAL HIGH (ref 65–99)
POTASSIUM: 4.3 mmol/L (ref 3.5–5.1)
Sodium: 137 mmol/L (ref 135–145)

## 2015-04-14 LAB — PROTIME-INR
INR: 4.54 — ABNORMAL HIGH (ref 0.00–1.49)
Prothrombin Time: 41.8 seconds — ABNORMAL HIGH (ref 11.6–15.2)

## 2015-04-14 LAB — CBC
HEMATOCRIT: 26.4 % — AB (ref 36.0–46.0)
HEMOGLOBIN: 8.2 g/dL — AB (ref 12.0–15.0)
MCH: 28.1 pg (ref 26.0–34.0)
MCHC: 31.1 g/dL (ref 30.0–36.0)
MCV: 90.4 fL (ref 78.0–100.0)
Platelets: 104 10*3/uL — ABNORMAL LOW (ref 150–400)
RBC: 2.92 MIL/uL — ABNORMAL LOW (ref 3.87–5.11)
RDW: 17 % — ABNORMAL HIGH (ref 11.5–15.5)
WBC: 6.7 10*3/uL (ref 4.0–10.5)

## 2015-04-14 MED ORDER — POTASSIUM CHLORIDE ER 10 MEQ PO TBCR: 10.0000 meq | EXTENDED_RELEASE_TABLET | Freq: Every day | ORAL | Status: AC

## 2015-04-14 MED ORDER — FUROSEMIDE 40 MG PO TABS: 80.0000 mg | ORAL_TABLET | Freq: Every day | ORAL | Status: AC

## 2015-04-14 MED ORDER — LIDOCAINE HCL 1 % IJ SOLN
INTRAMUSCULAR | Status: AC
  Filled 2015-04-14: qty 20

## 2015-04-14 MED ORDER — ALLOPURINOL 100 MG PO TABS: 100.0000 mg | ORAL_TABLET | Freq: Every day | ORAL | Status: AC

## 2015-04-14 MED ORDER — FUROSEMIDE 40 MG PO TABS
60.0000 mg | ORAL_TABLET | Freq: Every morning | ORAL | Status: DC
  Administered 2015-04-14: 60 mg via ORAL
  Filled 2015-04-14: qty 1

## 2015-04-14 MED ORDER — WARFARIN SODIUM 1 MG PO TABS: ORAL_TABLET | ORAL | Status: DC

## 2015-04-14 MED ORDER — WARFARIN SODIUM 1 MG PO TABS: ORAL_TABLET | ORAL | Status: AC

## 2015-04-14 MED ORDER — LIDOCAINE HCL 1 % IJ SOLN
INTRAMUSCULAR | Status: DC | PRN
  Administered 2015-04-14: 6 mL via INTRADERMAL

## 2015-04-14 MED ORDER — FUROSEMIDE 40 MG PO TABS: 60.0000 mg | ORAL_TABLET | Freq: Every evening | ORAL | Status: DC

## 2015-04-14 MED ORDER — SODIUM CHLORIDE 0.9 % IV SOLN: 2.0000 g | Freq: Three times a day (TID) | INTRAVENOUS | Status: AC

## 2015-04-14 MED ORDER — OXYCODONE-ACETAMINOPHEN 5-325 MG PO TABS: 0.5000 | ORAL_TABLET | Freq: Four times a day (QID) | ORAL | Status: AC | PRN

## 2015-04-14 NOTE — Progress Notes (Addendum)
Patient being discharged to [facility] facility. [facility] transporting patient to facility. Cell phone, blanket and eye glasses in patients presence. Patient taken out of hospital via stretcher. Report called to  Jenny Reichmann , Therapist, sports at [place]

## 2015-04-14 NOTE — Procedures (Signed)
Placement of left arm brachial PICC.  Length = 43, dual lumen.  Tip at SVC/RA junction.

## 2015-04-14 NOTE — Discharge Summary (Signed)
PATIENT DETAILS Name: Debbie [name] Age: 65 y.o. Sex: female Date of Birth: [birth year] MRN: [id number]. Admitting Physician: Waldemar Dickens, MD PCP:No PCP Per Patient  Admit Date: 04/06/2015 Discharge date: 04/14/2015  Recommendations for Outpatient Follow-up:  1. Ampicillin end date 04/26/15 2. Extremely labile INR-persistently elevated-inspite of coumadin being held for multiple days (last dose on 3/17)-suspect that this is due IV Ampicillin. Will need to be on a reduced regimen while on IV Ampicillin (see below). 3. Please check daily INR-would resume coumadin at a reduced dose when INR is around 2.5 4. Will need to re-adjust coumadin dosage back to usual dosing when off IV Ampicillin from 04/26/15 5. Has a incidental finding of right hip cystic lesion-refused MRI-needs PET Scan to be done as outpatient. 6. Ensure follow up with Cardiology,Renal, Ortho and ID follow up 7. Please remove PICC line once completes IV Abx therapy, but ensure ID follow up prior to discontinuing antibiotic 8. Please check weekly CBC/CMET while on IV ampicillin 9. Daily weights   PRIMARY DISCHARGE DIAGNOSIS:  Active Problems:   CKD (chronic kidney disease), stage IV (HCC)   HTN (hypertension)   Chronic anticoagulation - with Coumadin   Dyslipidemia   Cardiomyopathy, ischemic-40-45%   Supratherapeutic INR   Enterococcal bacteremia   Anemia   Acute congestive heart failure ([place])      PAST MEDICAL HISTORY: Past Medical History  Diagnosis Date  . Kidney transplant as cause of abnormal reaction or later complication   . Hypertension   . Colon polyps   . Gall stones 1980  . Anti-phospholipid antibody syndrome [facility])     (Based on hospital discharge summary march 2013)  . Paroxysmal atrial fibrillation (HCC)   . Carpal tunnel syndrome 2003    lt  . Esophageal stricture   . Hemorrhoids   . Renal disorder   . ESRD (end stage renal disease) on dialysis [facility])     "til I got my transplant in 2010"  .  DVT (deep venous thrombosis) (HCC) 1990    LLE  . History of blood transfusion     "related to the lupus"  . Dyslipidemia 07/22/2013  . Coronary artery disease   . Heart murmur     mild MR on echo  . Pneumonia     "3 times now" (08/25/2013)  . GERD (gastroesophageal reflux disease)   . Arthritis     "fingers" (08/25/2013)  . Glaucoma, left eye   . Bilateral carotid artery stenosis     1-39%  . Pulmonary HTN (HCC)     PASP 52mmHg    DISCHARGE MEDICATIONS: Current Discharge Medication List    START taking these medications   Details  potassium chloride (K-DUR) 10 MEQ tablet Take 1 tablet (10 mEq total) by mouth daily.      CONTINUE these medications which have CHANGED   Details  allopurinol (ZYLOPRIM) 100 MG tablet Take 1 tablet (100 mg total) by mouth daily.    ampicillin 2 g in sodium chloride 0.9 % 50 mL Inject 2 g into the vein every 8 (eight) hours. End Date 04/26/15    furosemide (LASIX) 40 MG tablet Take 2 tablets (80 mg total) by mouth daily.    oxyCODONE-acetaminophen (PERCOCET/ROXICET) 5-325 MG tablet Take 0.5-1 tablets by mouth every 6 (six) hours as needed for moderate pain or severe pain. Qty: 20 tablet, Refills: 0    warfarin (COUMADIN) 1 MG tablet Monday and Friday 0.5 mg and 1 mg on all other days-CURRENTLY ON  HOLD AS INR STILL 4.54 AS OF 04/14/15-WOULD RESUME AT ABOVE DOSAGE WHEN INR IS CLOSE TO 2.5. NOTE ON AMPICILLIN TILL 4/4-WHICH LIKELY IS CAUSING VERY LABILE AND ELEVATED INR.      CONTINUE these medications which have NOT CHANGED   Details  acetaminophen (TYLENOL) 325 MG tablet Take 2 tablets (650 mg total) by mouth every 6 (six) hours as needed for mild pain (or Fever >/= 101).    atorvastatin (LIPITOR) 40 MG tablet Take 1 tablet (40 mg total) by mouth daily at 6 PM. Qty: 30 tablet, Refills: 0    bimatoprost (LUMIGAN) 0.01 % SOLN Place 1 drop into both eyes at bedtime.     Brinzolamide-Brimonidine (SIMBRINZA) 1-0.2 % SUSP Place 1 drop into the left eye 3  (three) times daily.    calcitRIOL (ROCALTROL) 0.25 MCG capsule Take 0.25-0.5 mcg by mouth every other day. alternating dosages    carvedilol (COREG) 6.25 MG tablet Take 1 tablet (6.25 mg total) by mouth 2 (two) times daily with a meal. Qty: 60 tablet, Refills: 0    isosorbide mononitrate (IMDUR) 60 MG 24 hr tablet Take 1 tablet (60 mg total) by mouth daily. Qty: 30 tablet, Refills: 0    MAGNESIUM-OXIDE 400 (241.3 MG) MG tablet TAKE 1 TABLET BY MOUTH TWICE DAILY Qty: 60 tablet, Refills: 3    mycophenolate (MYFORTIC) 180 MG EC tablet Take 180 mg by mouth 2 (two) times daily.     predniSONE (DELTASONE) 5 MG tablet Take 5 mg by mouth daily with breakfast.     RANEXA 500 MG 12 hr tablet TAKE 1 TABLET BY MOUTH TWICE DAILY Qty: 180 tablet, Refills: 1    ranitidine (ZANTAC) 150 MG tablet Take 150 mg by mouth daily.     sodium bicarbonate 650 MG tablet Take 1,300 mg by mouth 2 (two) times daily.     tacrolimus (PROGRAF) 1 MG capsule Take 2 mg by mouth 2 (two) times daily.     timolol (TIMOPTIC) 0.5 % ophthalmic solution Place 1 drop into both eyes 2 (two) times daily. Reported on 03/13/2015    nitroGLYCERIN (NITROSTAT) 0.4 MG SL tablet Place 0.4 mg under the tongue every 5 (five) minutes as needed for chest pain.       STOP taking these medications     methocarbamol (ROBAXIN) 500 MG tablet         ALLERGIES:   Allergies  Allergen Reactions  . Feraheme [Ferumoxytol] Other (See Comments)    Chest pain, pulsating 02/17/15: tolerated Nulecit  . Vancomycin Anaphylaxis, Itching, Swelling and Other (See Comments)    Tongue swell  . Dorzolamide Hcl-Timolol Mal Rash and Other (See Comments)    Eye pain  . Gentamycin [Gentamicin Sulfate] Itching and Swelling  . Latanoprost Rash and Other (See Comments)    Eye pain  . Cefazolin Itching  . Codeine Itching  . Hydrocodone-Acetaminophen Itching  . Penicillins Itching    *tolerated ampicillin during 02/2015 admission Has patient had a  PCN reaction causing immediate rash, facial/tongue/throat swelling, SOB or lightheadedness with hypotension:No Has patient had a PCN reaction causing severe rash involving mucus membranes or skin necrosis:No Has patient had a PCN reaction that required hospitalization:No Has patient had a PCN reaction occurring within the last 10 years:No If all of the above answers are "NO", then may proceed with Cephalosporin use.     BRIEF HPI:  See H&P, Labs, Consult and Test reports for all details in brief, patient is a 65 y.o. female with a  Past Medical History of CKD post transplant, HTN,, anti-phospholipid, esophageal stricture, DVT, CAD, GERD, CHF, who presented with diastolic CHF decompensation, symptomatic anemia w/ supratherapeutic INR (>10)  CONSULTATIONS:   None  PERTINENT RADIOLOGIC STUDIES: Ct Humerus Right Wo Contrast  03/18/2015  CLINICAL DATA:  [place] 3-4 days ago.  Injured right upper extremity. EXAM: CT OF THE RIGHT SHOULDER WITHOUT CONTRAST; CT OF THE RIGHT HUMERUS WITHOUT CONTRAST; CT OF THE RIGHT FOREARM WITHOUT CONTRAST TECHNIQUE: Multidetector CT imaging was performed according to the standard protocol. Multiplanar CT image reconstructions were also generated. COMPARISON:  None. FINDINGS: No acute fractures are identified. The shoulder joint is maintained. No fracture or dislocation. Mild AC joint degenerative changes. No AC joint separation. No humeral fracture. There is extensive subacromial/subdeltoid and subscapularis bursitis. There is also a large glenohumeral joint effusion and severe synovitis. Dialysis grafts are noted in the right upper extremity. The elbow joint is maintained. No elbow fracture or obvious joint effusion. Headache cardiac No forearm fractures are identified. There is a moderate-sized right pleural effusion with overlying atelectasis. No obvious acute right rib fractures are identified. The thoracic vertebral bodies are intact. Moderate degenerative changes noted at  the sternoclavicular joints. Small scarred right kidney is noted. There is advanced atherosclerotic calcifications involving the aorta and branch vessels. IMPRESSION: No acute fractures of the right upper extremity are identified. The joints are maintained. Extensive synovitis, joint effusion and bursitis involving the right shoulder. Electronically Signed   By: Marijo Sanes M.D.   On: 03/18/2015 09:11   Ct Shoulder Right Wo Contrast  03/18/2015  CLINICAL DATA:  [place] 3-4 days ago.  Injured right upper extremity. EXAM: CT OF THE RIGHT SHOULDER WITHOUT CONTRAST; CT OF THE RIGHT HUMERUS WITHOUT CONTRAST; CT OF THE RIGHT FOREARM WITHOUT CONTRAST TECHNIQUE: Multidetector CT imaging was performed according to the standard protocol. Multiplanar CT image reconstructions were also generated. COMPARISON:  None. FINDINGS: No acute fractures are identified. The shoulder joint is maintained. No fracture or dislocation. Mild AC joint degenerative changes. No AC joint separation. No humeral fracture. There is extensive subacromial/subdeltoid and subscapularis bursitis. There is also a large glenohumeral joint effusion and severe synovitis. Dialysis grafts are noted in the right upper extremity. The elbow joint is maintained. No elbow fracture or obvious joint effusion. Headache cardiac No forearm fractures are identified. There is a moderate-sized right pleural effusion with overlying atelectasis. No obvious acute right rib fractures are identified. The thoracic vertebral bodies are intact. Moderate degenerative changes noted at the sternoclavicular joints. Small scarred right kidney is noted. There is advanced atherosclerotic calcifications involving the aorta and branch vessels. IMPRESSION: No acute fractures of the right upper extremity are identified. The joints are maintained. Extensive synovitis, joint effusion and bursitis involving the right shoulder. Electronically Signed   By: Marijo Sanes M.D.   On: 03/18/2015  09:11   Ct Forearm Right Wo Contrast  03/18/2015  CLINICAL DATA:  [place] 3-4 days ago.  Injured right upper extremity. EXAM: CT OF THE RIGHT SHOULDER WITHOUT CONTRAST; CT OF THE RIGHT HUMERUS WITHOUT CONTRAST; CT OF THE RIGHT FOREARM WITHOUT CONTRAST TECHNIQUE: Multidetector CT imaging was performed according to the standard protocol. Multiplanar CT image reconstructions were also generated. COMPARISON:  None. FINDINGS: No acute fractures are identified. The shoulder joint is maintained. No fracture or dislocation. Mild AC joint degenerative changes. No AC joint separation. No humeral fracture. There is extensive subacromial/subdeltoid and subscapularis bursitis. There is also a large glenohumeral joint effusion and severe synovitis. Dialysis grafts  are noted in the right upper extremity. The elbow joint is maintained. No elbow fracture or obvious joint effusion. Headache cardiac No forearm fractures are identified. There is a moderate-sized right pleural effusion with overlying atelectasis. No obvious acute right rib fractures are identified. The thoracic vertebral bodies are intact. Moderate degenerative changes noted at the sternoclavicular joints. Small scarred right kidney is noted. There is advanced atherosclerotic calcifications involving the aorta and branch vessels. IMPRESSION: No acute fractures of the right upper extremity are identified. The joints are maintained. Extensive synovitis, joint effusion and bursitis involving the right shoulder. Electronically Signed   By: Marijo Sanes M.D.   On: 03/18/2015 09:11   US Renal  03/15/2015  CLINICAL DATA:  Acute renal failure superimposed on stage 4 chronic kidney disease. EXAM: RENAL / URINARY TRACT ULTRASOUND COMPLETE COMPARISON:  Included portions from chest CT 02/18/2015 FINDINGS: Right Kidney: Not visualized. Left Kidney: Not visualized. Transplant kidney located in the pelvis, side not specified. There is no transplant hydronephrosis. Blood flow seen.  Transplant kidney measures 10 cm. Transplant cortical thickness appears maintained. No focal lesion. Detailed transplant Doppler evaluation not performed. Bladder: Appears normal for degree of bladder distention. IMPRESSION: 1. Native kidneys not identified, likely secondary renal atrophy. 2. Pelvic transplant kidney without hydronephrosis. Electronically Signed   By: Jeb Levering M.D.   On: 03/15/2015 23:08   Dg Chest Port 1 View  04/07/2015  CLINICAL DATA:  Congestive heart failure EXAM: PORTABLE CHEST 1 VIEW COMPARISON:  04/06/2015 FINDINGS: Congestive heart failure with pulmonary edema shows mild interval improvement. Small pleural effusions bilaterally. Central venous catheter tip in the SVC unchanged. IMPRESSION: Mild improvement in pulmonary edema. Electronically Signed   By: Franchot Gallo M.D.   On: 04/07/2015 07:28   Dg Chest Port 1 View  04/06/2015  CLINICAL DATA:  Chest pain EXAM: PORTABLE CHEST 1 VIEW COMPARISON:  March 26, 2015 FINDINGS: There is extensive airspace consolidation throughout the lungs bilaterally, more pronounced on the left than on the right. There is underlying interstitial edema. There is mild cardiomegaly with mild pulmonary venous hypertension. Central catheter tip is at the junction of the left innominate vein and superior vena cava. No pneumothorax. There is arthropathy in both shoulders. IMPRESSION: Widespread airspace consolidation bilaterally, more on the left than on the right, progressed from most recent prior study. Underlying interstitial edema. Suspect widespread pneumonia superimposed on congestive heart failure. Aspiration could present in this manner as well. No pneumothorax evident. Electronically Signed   By: Lowella Grip III M.D.   On: 04/06/2015 12:03   Dg Chest Port 1 View  03/26/2015  CLINICAL DATA:  Sepsis.  Pneumonia. EXAM: PORTABLE CHEST 1 VIEW COMPARISON:  March 25, 2015 FINDINGS: No pneumothorax. Diffuse left lung infiltrate is again  identified, obscuring the left heart border, similar in the interval given difference in technique. No other changes. Stable left IJ. IMPRESSION: Stable diffuse left lung infiltrate.  No other changes. Electronically Signed   By: Dorise Bullion III M.D   On: 03/26/2015 09:26   Dg Chest Port 1 View  03/25/2015  CLINICAL DATA:  Central line placement. EXAM: PORTABLE CHEST 1 VIEW COMPARISON:  03/25/2015. FINDINGS: Left IJ central line has been slightly retracted, its tip is at the level of the superior vena cava. Heart size stable. Persistent left lung diffuse infiltrate. Small left pleural effusion. No pneumothorax. IMPRESSION: 1. Left IJ line has been partially retracted, its tip is at the level of the superior vena cava. No pneumothorax. 2.  Diffuse left lung infiltrate again noted. Electronically Signed   By: Marcello Moores  Register   On: 03/25/2015 17:01   Dg Chest Port 1 View  03/25/2015  CLINICAL DATA:  Central line placement EXAM: PORTABLE CHEST 1 VIEW COMPARISON:  03/14/2015 FINDINGS: Left internal jugular central line has been placed and crosses midline to the right side with tip over the anticipated position of the brachiocephalic vein. No pneumothorax. Increased opacity throughout the lingula IMPRESSION: Central line projects with tip over the brachiocephalic vein on the right and should be retracted about 5 cm and re- advanced with repeat imaging. Lingular opacity Critical Value/emergent results were called by telephone at the time of interpretation on 03/25/2015 at 4:15 pm to Sybil, the patient's nurse, who verbally acknowledged these results. Electronically Signed   By: Skipper Cliche M.D.   On: 03/25/2015 16:17   Dg Fluoro Guided Needle Plc Aspiration/injection Loc  03/21/2015  CLINICAL DATA:  Sinusitis, joint effusion and bursitis involving the right shoulder on CT 03/17/2015. EXAM: RIGHT SHOULDER ASPIRATION UNDER FLUOROSCOPY TECHNIQUE: Despite an INR of 3.99, Dr. Meridee Score insisted on a relatively  emergent right shoulder aspiration due to the possibility of sepsis. The increased risk of bleeding was discussed with the patient prior to the procedure, as well as the risk of infection. Patient elected to proceed with the procedure. Informed consent was obtained. An appropriate skin entrance site was determined. The site was marked, prepped with Betadine, draped in the usual sterile fashion, and infiltrated locally with buffered Lidocaine. Based on CT imaging, 80 20 gauge spinal needle was inserted over the mid humeral head and 5-60 cc of serosanguineous fluid returned. No immediate complication. FLUOROSCOPY TIME:  Radiation Exposure Index (as provided by the fluoroscopic device): If the device does not provide the exposure index: Fluoroscopy Time (in minutes and seconds):  0 minutes 24 seconds. Number of Acquired Images:  None. FINDINGS: Despite an INR of 3.99, Dr. Meridee Score insisted on a relatively emergent right shoulder aspiration due to the possibility of sepsis. The increased risk of bleeding was discussed with the patient prior to the procedure, as well as the risk of infection. Patient elected to proceed with the procedure. Informed consent was obtained. An appropriate skin entrance site was determined. The site was marked, prepped with Betadine, draped in the usual sterile fashion, and infiltrated locally with buffered Lidocaine. Based on CT imaging, a 20 gauge spinal needle was inserted over the mid humeral head with return of 5-6 cc of serosanguineous fluid. No immediate complication. IMPRESSION: Technically successful right shoulder aspiration. Electronically Signed   By: Lorin Picket M.D.   On: 03/21/2015 15:16   Dg Hip Unilat With Pelvis 2-3 Views Right  04/08/2015  CLINICAL DATA:  RIGHT hip pain, fell in bathroom, was unable to move RIGHT leg, history hypertension, end-stage renal disease post renal transplant EXAM: DG HIP (WITH OR WITHOUT PELVIS) 2-3V RIGHT COMPARISON:  None FINDINGS:  Diffuse osseous demineralization. Minimal narrowing of both hip joints. SI joint spaces symmetric and preserved. No acute fracture dislocation. Small lytic lesion at the proximal RIGHT femoral metaphysis 16 x 8 mm, nonspecific. Extensive atherosclerotic calcifications. IMPRESSION: No definite acute fracture dislocation. Lytic lesion at proximal RIGHT femur, nonspecific; this could be seen with myeloma, lytic metastasis, brown tumor in the setting of chronic renal failure. Stability is uncertain due to the lack of prior studies. Consider nonemergent follow-up bone survey to assess for additional lesions. Electronically Signed   By: Lavonia Dana M.D.   On:  04/08/2015 16:48     PERTINENT LAB RESULTS: CBC:  Recent Labs  04/13/15 0406 04/14/15 0520  WBC 7.7 6.7  HGB 9.8* 8.2*  HCT 31.6* 26.4*  PLT 109* 104*   CMET CMP     Component Value Date/Time   NA 137 04/14/2015 0520   K 4.3 04/14/2015 0520   CL 101 04/14/2015 0520   CO2 28 04/14/2015 0520   GLUCOSE 106* 04/14/2015 0520   BUN 41* 04/14/2015 0520   CREATININE 2.62* 04/14/2015 0520   CALCIUM 8.8* 04/14/2015 0520   PROT 5.0* 04/06/2015 1200   ALBUMIN 2.0* 04/06/2015 1200   AST 42* 04/06/2015 1200   ALT 16 04/06/2015 1200   ALKPHOS 65 04/06/2015 1200   BILITOT 0.9 04/06/2015 1200   GFRNONAA 18* 04/14/2015 0520   GFRAA 21* 04/14/2015 0520    GFR Estimated Creatinine Clearance: 19.3 mL/min (by C-G formula based on Cr of 2.62). No results for input(s): LIPASE, AMYLASE in the last 72 hours. No results for input(s): CKTOTAL, CKMB, CKMBINDEX, TROPONINI in the last 72 hours. Invalid input(s): POCBNP No results for input(s): DDIMER in the last 72 hours. No results for input(s): HGBA1C in the last 72 hours. No results for input(s): CHOL, HDL, LDLCALC, TRIG, CHOLHDL, LDLDIRECT in the last 72 hours. No results for input(s): TSH, T4TOTAL, T3FREE, THYROIDAB in the last 72 hours.  Invalid input(s): FREET3 No results for input(s):  VITAMINB12, FOLATE, FERRITIN, TIBC, IRON, RETICCTPCT in the last 72 hours. Coags:  Recent Labs  04/13/15 0406 04/14/15 0520  INR 4.34* 4.54*   Microbiology: Recent Results (from the past 240 hour(s))  Culture, blood (Routine X 2) w Reflex to ID Panel     Status: None   Collection Time: 04/06/15 10:00 PM  Result Value Ref Range Status   Specimen Description BLOOD LEFT HAND  Final   Special Requests IN PEDIATRIC BOTTLE 2CC  Final   Culture NO GROWTH 5 DAYS  Final   Report Status 04/11/2015 FINAL  Final  Culture, blood (Routine X 2) w Reflex to ID Panel     Status: None   Collection Time: 04/06/15 10:00 PM  Result Value Ref Range Status   Specimen Description BLOOD LEFT THUMB  Final   Special Requests BOTTLES DRAWN AEROBIC ONLY 6CC  Final   Culture NO GROWTH 5 DAYS  Final   Report Status 04/11/2015 FINAL  Final  MRSA PCR Screening     Status: None   Collection Time: 04/06/15 11:33 PM  Result Value Ref Range Status   MRSA by PCR NEGATIVE NEGATIVE Final    Comment:        The GeneXpert MRSA Assay (FDA approved for NASAL specimens only), is one component of a comprehensive MRSA colonization surveillance program. It is not intended to diagnose MRSA infection nor to guide or monitor treatment for MRSA infections.      BRIEF HOSPITAL COURSE:  Acute on chronic diastolic heart failure: Much more compensated, -3.7 L so far, weight decreased to 155 pounds (166 pounds on admission). Leg edema has completely resolved. Has been transitioned to oral Lasix-have increased dosage to 80 mg daily (prior dose 40 mg daily),since clinically improving, suspect stable to be discharged back to SNF today.Please ensure follow up with cardiology and renal.  History of chest pain with minimally elevated troponins: Trend is flat not consistent with ACS. Note, patient has known history of CAD-has had numerous prior cardiology evaluations in the past. Given CKD stage IV, LHC has not been done. No  further  chest pain since admission, discussion with patient this admission-she prefers to be managed medically-and does not want any further investigation at this time. Continue-statin, Coreg. Not on ASA as on anticoagulation with mostly supratherapeutic and very labile INR.  Coagulopathy: On chronic Coumadin, INR extremely labile and still supratherapeutic and likely due to ampicillin. Pharmacy dosed while inpatient-after d/w paharmacy-recommendations are to decrease dosing of coumadin while patient is on Ampicillin.However INR still 4.5-last dose of coumadin was on 3/17. Recommend to hold coumadin till INR is around 2.5 and then resume coumadin at a lower dose of 1 mg daily-except on Monday/Friday 0.5 mg. May need to check INR daily untill INR decreases to 2.5- given how labile it has been.Once off Ampicillin resume prior dosing of coumadin  (1.25 mg daily except 2.5 mg on Monday and Friday).  Hx of Antiphospholipid syndrome/DVT: On chronic Coumadin. See above.  Hx of Right fibular and medial malleolus fracture: Followed by Dr. Sherlyn Hay chart review-it appears that given patient's prior cardiac issues patient was not considered a candidate for operative correction.  Hypertension: Controlled, continue Coreg, Imdur  Dyslipidemia:continue Statin  Anemia: Likely secondary to chronic kidney disease.No evidence of overt blood loss.  Hemoglobin currently stable. Was transfused  4 PRBC this admission.Please ensure follow up Nephrology for consideration of dosing of iron and aranesp.  Enterococcus bacteremia with septic arthritis involving the right shoulder: She was most recently hospitalized from 2/19-3/6, during that hospitalization she underwent a TEE which was negative for vegetations. She refused incision and drainage of right shoulder. Infectious disease, recommended ampicillin through 04/26/15.Please ensure ID follow up-prior to discontinuing ampicillin.Recommend weekly CBC/CMET while on  Ampicillin.  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 Continue Prograf, prednisone and mycophenolate. Stable for outpatient follow-up with her primary nephrologist.  R hip pain: Currently stable, x-ray of the right hip on 3/17 showed a small lytic lesion. Patient refused a MRI of the right hip. SPEP without M spike. Plans are for outpatient PET scan   TODAY-DAY OF DISCHARGE:  Subjective:   Debbie [name] today has no headache,no chest abdominal pain,no new weakness tingling or numbness, feels much better wants to go home today.   Objective:   Blood pressure 123/54, pulse 79, temperature 98.1 F (36.7 C), temperature source Oral, resp. rate 16, height 5\' 1"  (1.549 m), weight 70.761 kg (156 lb), SpO2 97 %.  Intake/Output Summary (Last 24 hours) at 04/14/15 0843 Last data filed at 04/14/15 0634  Gross per 24 hour  Intake   1010 ml  Output    775 ml  Net    235 ml   Filed Weights   04/12/15 0737 04/13/15 0627 04/14/15 0533  Weight: 70.988 kg (156 lb 8 oz) 70.308 kg (155 lb) 70.761 kg (156 lb)    Exam Awake Alert, Oriented *3, No new F.N deficits, Normal affect Bruce.AT,PERRAL Supple Neck,No JVD, No cervical lymphadenopathy appriciated.  Symmetrical Chest wall movement, Good air movement bilaterally, CTAB RRR,No Gallops,Rubs or new Murmurs, No Parasternal Heave +ve B.Sounds, Abd Soft, Non tender, No organomegaly appriciated, No rebound -guarding or rigidity. No Cyanosis, Clubbing or edema, No new Rash or bruise  DISCHARGE CONDITION: Stable  DISPOSITION: SNF  DISCHARGE INSTRUCTIONS:    Activity:  As tolerated with Full fall precautions use walker/cane & assistance as needed  Get Medicines reviewed and adjusted: Please take all your medications with you for your next visit with your Primary MD  Please request your  Primary MD to go over all hospital tests and procedure/radiological results at the follow up,  please ask your Primary MD to get all Hospital records sent to his/her office.  If you experience worsening of your admission symptoms, develop shortness of breath, life threatening emergency, suicidal or homicidal thoughts you must seek medical attention immediately by calling 911 or calling your MD immediately  if symptoms less severe.  You must read complete instructions/literature along with all the possible adverse reactions/side effects for all the Medicines you take and that have been prescribed to you. Take any new Medicines after you have completely understood and accpet all the possible adverse reactions/side effects.   Do not drive when taking Pain medications.   Do not take more than prescribed Pain, Sleep and Anxiety Medications  Special Instructions: If you have smoked or chewed Tobacco  in the last 2 yrs please stop smoking, stop any regular Alcohol  and or any Recreational drug use.  Wear Seat belts while driving.  Please note  You were cared for by a hospitalist during your hospital stay. Once you are discharged, your primary care physician will handle any further medical issues. Please note that NO REFILLS for any discharge medications will be authorized once you are discharged, as it is imperative that you return to your primary care physician (or establish a relationship with a primary care physician if you do not have one) for your aftercare needs so that they can reassess your need for medications and monitor your lab values.   Diet recommendation: Heart Healthy diet Fluid restriction 1.5 lit/day  Discharge Instructions    (HEART FAILURE PATIENTS) Call MD:  Anytime you have any of the following symptoms: 1) 3 pound weight gain in 24 hours or 5 pounds in 1 week 2) shortness of breath, with or without a dry hacking cough 3) swelling in the hands, feet or stomach 4) if you have to sleep on extra pillows at night in order to breathe.    Complete by:  As directed      AMB  Referral to [facility]    Complete by:  As directed   Reason for consult:  Active patient returning to a skill facility,  Expected date of contact:  1-3 days (reserved for hospital discharges)  Patient is active with community nurse, Olin Hauser.  Please assign patient to social worker as she is high risk when returning home from a skilled facility. Please contact: Natividad Brood, RN BSN [facility] Liaison  [phone] business mobile phone Toll free office [phone]     Diet - low sodium heart healthy    Complete by:  As directed      Increase activity slowly    Complete by:  As directed            Follow-up Information    Follow up with [facility].   Specialty:  [place] information:   [place] [phone]      Follow up with Sueanne Margarita, MD. Schedule an appointment as soon as possible for a visit in 2 weeks.   Specialty:  Cardiology   Contact information:   [id number] [place] [address] [phone]       Follow up with [facility]. Schedule an appointment as soon as possible for a visit in 2 weeks.   Why:  WITH PRIMARY NEPHROLOGIST   Contact information:  [address] [place] [address] [phone]      Total Time spent on discharge equals 45 minutes.  SignedOren Binet 04/14/2015 8:43 AM

## 2015-04-14 NOTE — Care Management Important Message (Signed)
Important Message  Patient Details  Name: Debbie [name] MRN: [id number] Date of Birth: 1951-01-20   Medicare Important Message Given:  Yes    Makayah Pauli P Glennon Kopko 04/14/2015, 1:06 PM

## 2015-04-14 NOTE — Progress Notes (Signed)
Candy, patient's daughter, made aware of patient going back to rehab facility per pt request.

## 2015-04-14 NOTE — Progress Notes (Signed)
Physical Therapy Treatment Patient Details Name: Debbie [name] MRN: [id number] DOB: Feb 01, 1950 Today's Date: 04/14/2015    History of Present Illness Patient is a 65 yo female admitted 04/06/15 with anemia, weakness, CHF.   PMH:  Septic Rt shoulder, Rt hip pain, RLE fx, CKD, renal transplant, HTN, CAD, CHF, PAF, gout    PT Comments    Patient seen by request to assist with LE therapeutic exercise and repositioning. Patient tolerated initial exercises and repositioning but then needed to use bed pan. Assisted patient with bed mobility and hygiene pericare tasks. Session limited as patient transport arrived for d/c back to rehab facility.     Follow Up Recommendations  SNF;Supervision/Assistance - 24 hour     Equipment Recommendations  None recommended by PT    Recommendations for Other Services       Precautions / Restrictions Precautions Precautions: Fall Required Braces or Orthoses:  (CAM boot on R) Restrictions Weight Bearing Restrictions: Yes RLE Weight Bearing: Weight bearing as tolerated    Mobility  Bed Mobility Overal bed mobility: Needs Assistance;+2 for physical assistance Bed Mobility: Rolling;Sidelying to Sit;Sit to Sidelying Rolling: Mod assist (min assist to the right, moderate assist to left)   Supine to sit: Mod assist Sit to supine: Min assist   General bed mobility comments: Verbal cues for technique.  Required Mod +2 assist and increased time to move to sitting and back to sidelying.  Once upright, patient able to maintain sitting balance with min guard assist.  Assist to reposition in bed.  Transfers                 General transfer comment: [facility] as pt for d/c  Ambulation/Gait                 Stairs            Wheelchair Mobility    Modified Rankin (Stroke Patients Only)       Balance     Sitting balance-Leahy Scale: Fair Sitting balance - Comments: anterior lean, cues for posture, could not tolerate long secondary to  discomfort in buttocks                            Cognition Arousal/Alertness: Awake/alert Behavior During Therapy: WFL for tasks assessed/performed Overall Cognitive Status: History of cognitive impairments - at baseline                      Exercises General Exercises - Lower Extremity Ankle Circles/Pumps: AROM;Both (contiuous) Straight Leg Raises: Strengthening;Both;5 reps;Supine (required assist for increased range)    General Comments        Pertinent Vitals/Pain Pain Assessment: Faces Faces Pain Scale: Hurts little more Pain Location: buttocks Pain Descriptors / Indicators: Aching;Grimacing;Guarding;Discomfort Pain Intervention(s): Monitored during session;Repositioned    Home Living                      Prior Function            PT Goals (current goals can now be found in the care plan section) Acute Rehab PT Goals Patient Stated Goal: To get stronger PT Goal Formulation: With patient/family Time For Goal Achievement: 04/24/15 Potential to Achieve Goals: Fair Progress towards PT goals: Progressing toward goals (modestly)    Frequency  Min 2X/week    PT Plan Current plan remains appropriate    Co-evaluation  End of Session   Activity Tolerance: Patient limited by fatigue;Other (comment) (needed to urinate) Patient left: in bed;with call bell/phone within reach;with bed alarm set;with family/visitor present     Time: [id number] PT Time Calculation (min) (ACUTE ONLY): 16 min  Charges:  $Therapeutic Activity: 8-22 mins                    G CodesDuncan Dull 07-May-2015, [place], [place] DPT  [phone]

## 2015-04-15 ENCOUNTER — Ambulatory Visit: Payer: Self-pay | Admitting: Licensed Clinical Social Worker

## 2015-04-18 ENCOUNTER — Inpatient Hospital Stay (HOSPITAL_COMMUNITY)
Admission: EM | Admit: 2015-04-18 | Discharge: 2015-05-23 | DRG: 252 | Disposition: E | Payer: Medicare Other | Attending: Internal Medicine | Admitting: Internal Medicine

## 2015-04-18 ENCOUNTER — Inpatient Hospital Stay (HOSPITAL_COMMUNITY): Payer: Medicare Other

## 2015-04-18 ENCOUNTER — Encounter (HOSPITAL_COMMUNITY): Payer: Self-pay

## 2015-04-18 ENCOUNTER — Emergency Department (HOSPITAL_COMMUNITY): Payer: Medicare Other

## 2015-04-18 ENCOUNTER — Ambulatory Visit: Payer: Medicare Other | Admitting: Cardiology

## 2015-04-18 DIAGNOSIS — B952 Enterococcus as the cause of diseases classified elsewhere: Secondary | ICD-10-CM | POA: Diagnosis present

## 2015-04-18 DIAGNOSIS — E8889 Other specified metabolic disorders: Secondary | ICD-10-CM | POA: Diagnosis present

## 2015-04-18 DIAGNOSIS — I10 Essential (primary) hypertension: Secondary | ICD-10-CM | POA: Diagnosis not present

## 2015-04-18 DIAGNOSIS — A0472 Enterocolitis due to Clostridium difficile, not specified as recurrent: Secondary | ICD-10-CM | POA: Insufficient documentation

## 2015-04-18 DIAGNOSIS — Z95828 Presence of other vascular implants and grafts: Secondary | ICD-10-CM

## 2015-04-18 DIAGNOSIS — M329 Systemic lupus erythematosus, unspecified: Secondary | ICD-10-CM | POA: Diagnosis present

## 2015-04-18 DIAGNOSIS — Z940 Kidney transplant status: Secondary | ICD-10-CM

## 2015-04-18 DIAGNOSIS — K219 Gastro-esophageal reflux disease without esophagitis: Secondary | ICD-10-CM | POA: Diagnosis present

## 2015-04-18 DIAGNOSIS — I5021 Acute systolic (congestive) heart failure: Secondary | ICD-10-CM | POA: Diagnosis not present

## 2015-04-18 DIAGNOSIS — N179 Acute kidney failure, unspecified: Secondary | ICD-10-CM | POA: Diagnosis present

## 2015-04-18 DIAGNOSIS — Y832 Surgical operation with anastomosis, bypass or graft as the cause of abnormal reaction of the patient, or of later complication, without mention of misadventure at the time of the procedure: Secondary | ICD-10-CM | POA: Diagnosis not present

## 2015-04-18 DIAGNOSIS — R062 Wheezing: Secondary | ICD-10-CM

## 2015-04-18 DIAGNOSIS — Z87891 Personal history of nicotine dependence: Secondary | ICD-10-CM | POA: Diagnosis not present

## 2015-04-18 DIAGNOSIS — I5043 Acute on chronic combined systolic (congestive) and diastolic (congestive) heart failure: Secondary | ICD-10-CM | POA: Diagnosis present

## 2015-04-18 DIAGNOSIS — N2581 Secondary hyperparathyroidism of renal origin: Secondary | ICD-10-CM | POA: Diagnosis present

## 2015-04-18 DIAGNOSIS — J9601 Acute respiratory failure with hypoxia: Secondary | ICD-10-CM | POA: Diagnosis present

## 2015-04-18 DIAGNOSIS — I214 Non-ST elevation (NSTEMI) myocardial infarction: Principal | ICD-10-CM

## 2015-04-18 DIAGNOSIS — E785 Hyperlipidemia, unspecified: Secondary | ICD-10-CM | POA: Diagnosis present

## 2015-04-18 DIAGNOSIS — T82858A Stenosis of vascular prosthetic devices, implants and grafts, initial encounter: Secondary | ICD-10-CM | POA: Diagnosis not present

## 2015-04-18 DIAGNOSIS — I9589 Other hypotension: Secondary | ICD-10-CM | POA: Diagnosis not present

## 2015-04-18 DIAGNOSIS — A047 Enterocolitis due to Clostridium difficile: Secondary | ICD-10-CM | POA: Diagnosis not present

## 2015-04-18 DIAGNOSIS — I252 Old myocardial infarction: Secondary | ICD-10-CM | POA: Diagnosis not present

## 2015-04-18 DIAGNOSIS — R34 Anuria and oliguria: Secondary | ICD-10-CM | POA: Diagnosis not present

## 2015-04-18 DIAGNOSIS — R410 Disorientation, unspecified: Secondary | ICD-10-CM | POA: Diagnosis not present

## 2015-04-18 DIAGNOSIS — Z86718 Personal history of other venous thrombosis and embolism: Secondary | ICD-10-CM | POA: Diagnosis not present

## 2015-04-18 DIAGNOSIS — R7881 Bacteremia: Secondary | ICD-10-CM | POA: Diagnosis present

## 2015-04-18 DIAGNOSIS — I200 Unstable angina: Secondary | ICD-10-CM | POA: Diagnosis not present

## 2015-04-18 DIAGNOSIS — D696 Thrombocytopenia, unspecified: Secondary | ICD-10-CM | POA: Diagnosis present

## 2015-04-18 DIAGNOSIS — I272 Other secondary pulmonary hypertension: Secondary | ICD-10-CM | POA: Diagnosis present

## 2015-04-18 DIAGNOSIS — N184 Chronic kidney disease, stage 4 (severe): Secondary | ICD-10-CM | POA: Diagnosis present

## 2015-04-18 DIAGNOSIS — Z993 Dependence on wheelchair: Secondary | ICD-10-CM

## 2015-04-18 DIAGNOSIS — I130 Hypertensive heart and chronic kidney disease with heart failure and stage 1 through stage 4 chronic kidney disease, or unspecified chronic kidney disease: Secondary | ICD-10-CM | POA: Diagnosis present

## 2015-04-18 DIAGNOSIS — I2510 Atherosclerotic heart disease of native coronary artery without angina pectoris: Secondary | ICD-10-CM | POA: Diagnosis present

## 2015-04-18 DIAGNOSIS — Z515 Encounter for palliative care: Secondary | ICD-10-CM | POA: Insufficient documentation

## 2015-04-18 DIAGNOSIS — I480 Paroxysmal atrial fibrillation: Secondary | ICD-10-CM | POA: Diagnosis present

## 2015-04-18 DIAGNOSIS — K922 Gastrointestinal hemorrhage, unspecified: Secondary | ICD-10-CM | POA: Diagnosis present

## 2015-04-18 DIAGNOSIS — M009 Pyogenic arthritis, unspecified: Secondary | ICD-10-CM | POA: Diagnosis present

## 2015-04-18 DIAGNOSIS — Z66 Do not resuscitate: Secondary | ICD-10-CM | POA: Diagnosis present

## 2015-04-18 DIAGNOSIS — Z7952 Long term (current) use of systemic steroids: Secondary | ICD-10-CM

## 2015-04-18 DIAGNOSIS — Z992 Dependence on renal dialysis: Secondary | ICD-10-CM

## 2015-04-18 DIAGNOSIS — T829XXA Unspecified complication of cardiac and vascular prosthetic device, implant and graft, initial encounter: Secondary | ICD-10-CM

## 2015-04-18 DIAGNOSIS — Z452 Encounter for adjustment and management of vascular access device: Secondary | ICD-10-CM

## 2015-04-18 DIAGNOSIS — D6861 Antiphospholipid syndrome: Secondary | ICD-10-CM | POA: Insufficient documentation

## 2015-04-18 DIAGNOSIS — I959 Hypotension, unspecified: Secondary | ICD-10-CM

## 2015-04-18 DIAGNOSIS — I5033 Acute on chronic diastolic (congestive) heart failure: Secondary | ICD-10-CM | POA: Diagnosis not present

## 2015-04-18 DIAGNOSIS — M109 Gout, unspecified: Secondary | ICD-10-CM | POA: Diagnosis present

## 2015-04-18 DIAGNOSIS — I5042 Chronic combined systolic (congestive) and diastolic (congestive) heart failure: Secondary | ICD-10-CM | POA: Insufficient documentation

## 2015-04-18 DIAGNOSIS — R079 Chest pain, unspecified: Secondary | ICD-10-CM | POA: Diagnosis present

## 2015-04-18 DIAGNOSIS — J811 Chronic pulmonary edema: Secondary | ICD-10-CM

## 2015-04-18 DIAGNOSIS — Z79899 Other long term (current) drug therapy: Secondary | ICD-10-CM | POA: Diagnosis not present

## 2015-04-18 DIAGNOSIS — Z7901 Long term (current) use of anticoagulants: Secondary | ICD-10-CM

## 2015-04-18 DIAGNOSIS — I429 Cardiomyopathy, unspecified: Secondary | ICD-10-CM | POA: Diagnosis present

## 2015-04-18 DIAGNOSIS — R791 Abnormal coagulation profile: Secondary | ICD-10-CM | POA: Diagnosis present

## 2015-04-18 DIAGNOSIS — D649 Anemia, unspecified: Secondary | ICD-10-CM | POA: Diagnosis present

## 2015-04-18 DIAGNOSIS — R0600 Dyspnea, unspecified: Secondary | ICD-10-CM | POA: Diagnosis not present

## 2015-04-18 DIAGNOSIS — R0902 Hypoxemia: Secondary | ICD-10-CM

## 2015-04-18 DIAGNOSIS — J9690 Respiratory failure, unspecified, unspecified whether with hypoxia or hypercapnia: Secondary | ICD-10-CM

## 2015-04-18 DIAGNOSIS — R0602 Shortness of breath: Secondary | ICD-10-CM

## 2015-04-18 DIAGNOSIS — R609 Edema, unspecified: Secondary | ICD-10-CM

## 2015-04-18 LAB — PROTIME-INR
INR: 3 — ABNORMAL HIGH (ref 0.00–1.49)
Prothrombin Time: 30.6 s — ABNORMAL HIGH (ref 11.6–15.2)

## 2015-04-18 LAB — I-STAT TROPONIN, ED: Troponin i, poc: 0.63 ng/mL (ref 0.00–0.08)

## 2015-04-18 LAB — CBC WITH DIFFERENTIAL/PLATELET
Basophils Absolute: 0 10*3/uL (ref 0.0–0.1)
Basophils Relative: 0 %
Eosinophils Absolute: 0.6 10*3/uL (ref 0.0–0.7)
Eosinophils Relative: 4 %
HCT: 22.3 % — ABNORMAL LOW (ref 36.0–46.0)
Hemoglobin: 7 g/dL — ABNORMAL LOW (ref 12.0–15.0)
Lymphocytes Relative: 20 %
Lymphs Abs: 3 10*3/uL (ref 0.7–4.0)
MCH: 27.8 pg (ref 26.0–34.0)
MCHC: 31.4 g/dL (ref 30.0–36.0)
MCV: 88.5 fL (ref 78.0–100.0)
Monocytes Absolute: 1 10*3/uL (ref 0.1–1.0)
Monocytes Relative: 7 %
Neutro Abs: 10.2 10*3/uL — ABNORMAL HIGH (ref 1.7–7.7)
Neutrophils Relative %: 69 %
Platelets: 174 10*3/uL (ref 150–400)
RBC: 2.52 MIL/uL — ABNORMAL LOW (ref 3.87–5.11)
RDW: 17.1 % — ABNORMAL HIGH (ref 11.5–15.5)
WBC: 14.8 10*3/uL — ABNORMAL HIGH (ref 4.0–10.5)

## 2015-04-18 LAB — CBC
HCT: 24.1 % — ABNORMAL LOW (ref 36.0–46.0)
HEMOGLOBIN: 7.7 g/dL — AB (ref 12.0–15.0)
MCH: 28.6 pg (ref 26.0–34.0)
MCHC: 32 g/dL (ref 30.0–36.0)
MCV: 89.6 fL (ref 78.0–100.0)
Platelets: 156 10*3/uL (ref 150–400)
RBC: 2.69 MIL/uL — AB (ref 3.87–5.11)
RDW: 16.4 % — ABNORMAL HIGH (ref 11.5–15.5)
WBC: 11.5 10*3/uL — ABNORMAL HIGH (ref 4.0–10.5)

## 2015-04-18 LAB — BASIC METABOLIC PANEL WITH GFR
Anion gap: 13 (ref 5–15)
BUN: 51 mg/dL — ABNORMAL HIGH (ref 6–20)
CO2: 22 mmol/L (ref 22–32)
Calcium: 9.4 mg/dL (ref 8.9–10.3)
Chloride: 104 mmol/L (ref 101–111)
Creatinine, Ser: 3.49 mg/dL — ABNORMAL HIGH (ref 0.44–1.00)
GFR calc Af Amer: 15 mL/min — ABNORMAL LOW
GFR calc non Af Amer: 13 mL/min — ABNORMAL LOW
Glucose, Bld: 110 mg/dL — ABNORMAL HIGH (ref 65–99)
Potassium: 4.2 mmol/L (ref 3.5–5.1)
Sodium: 139 mmol/L (ref 135–145)

## 2015-04-18 LAB — PREPARE RBC (CROSSMATCH)

## 2015-04-18 LAB — MAGNESIUM: Magnesium: 1.9 mg/dL (ref 1.7–2.4)

## 2015-04-18 LAB — TROPONIN I: Troponin I: 0.95 ng/mL (ref <0.031)

## 2015-04-18 LAB — PHOSPHORUS: Phosphorus: 4.1 mg/dL (ref 2.5–4.6)

## 2015-04-18 LAB — BRAIN NATRIURETIC PEPTIDE: B Natriuretic Peptide: 1333.7 pg/mL — ABNORMAL HIGH (ref 0.0–100.0)

## 2015-04-18 MED ORDER — ONDANSETRON HCL 4 MG PO TABS: 4.0000 mg | ORAL_TABLET | Freq: Four times a day (QID) | ORAL | Status: DC | PRN

## 2015-04-18 MED ORDER — MAGNESIUM OXIDE 400 (241.3 MG) MG PO TABS
400.0000 mg | ORAL_TABLET | Freq: Two times a day (BID) | ORAL | Status: DC
  Administered 2015-04-20 – 2015-04-23 (×8): 400 mg via ORAL
  Filled 2015-04-18 (×16): qty 1

## 2015-04-18 MED ORDER — FUROSEMIDE 10 MG/ML IJ SOLN
120.0000 mg | INTRAVENOUS | Status: DC
  Filled 2015-04-18: qty 12

## 2015-04-18 MED ORDER — SODIUM CHLORIDE 0.9 % IV SOLN
25.0000 mg | Freq: Once | INTRAVENOUS | Status: AC
  Administered 2015-04-19: 25 mg via INTRAVENOUS
  Filled 2015-04-18: qty 2

## 2015-04-18 MED ORDER — ISOSORBIDE MONONITRATE ER 60 MG PO TB24: 60.0000 mg | ORAL_TABLET | Freq: Every day | ORAL | Status: DC

## 2015-04-18 MED ORDER — NITROGLYCERIN 0.4 MG SL SUBL
0.4000 mg | SUBLINGUAL_TABLET | SUBLINGUAL | Status: DC | PRN
  Administered 2015-04-18 – 2015-04-29 (×6): 0.4 mg via SUBLINGUAL
  Filled 2015-04-18 (×3): qty 1

## 2015-04-18 MED ORDER — SODIUM CHLORIDE 0.9 % IV SOLN
Freq: Once | INTRAVENOUS | Status: AC
  Administered 2015-04-21: 21:00:00 via INTRAVENOUS

## 2015-04-18 MED ORDER — DARBEPOETIN ALFA 200 MCG/0.4ML IJ SOSY: 200.0000 ug | PREFILLED_SYRINGE | INTRAMUSCULAR | Status: DC

## 2015-04-18 MED ORDER — MYCOPHENOLATE SODIUM 180 MG PO TBEC
180.0000 mg | DELAYED_RELEASE_TABLET | Freq: Two times a day (BID) | ORAL | Status: DC
  Administered 2015-04-20 – 2015-04-28 (×17): 180 mg via ORAL
  Filled 2015-04-18 (×24): qty 1

## 2015-04-18 MED ORDER — SODIUM CHLORIDE 0.9 % IV SOLN: 250.0000 mL | INTRAVENOUS | Status: DC | PRN

## 2015-04-18 MED ORDER — SODIUM CHLORIDE 0.9% FLUSH
3.0000 mL | Freq: Two times a day (BID) | INTRAVENOUS | Status: DC
  Administered 2015-04-18: 3 mL via INTRAVENOUS

## 2015-04-18 MED ORDER — LEVALBUTEROL HCL 1.25 MG/0.5ML IN NEBU
1.2500 mg | INHALATION_SOLUTION | Freq: Four times a day (QID) | RESPIRATORY_TRACT | Status: DC | PRN
  Administered 2015-04-29: 1.25 mg via RESPIRATORY_TRACT
  Filled 2015-04-18 (×2): qty 0.5

## 2015-04-18 MED ORDER — TACROLIMUS 1 MG PO CAPS
2.0000 mg | ORAL_CAPSULE | Freq: Two times a day (BID) | ORAL | Status: DC
  Administered 2015-04-20 – 2015-04-28 (×17): 2 mg via ORAL
  Filled 2015-04-18 (×24): qty 2

## 2015-04-18 MED ORDER — FUROSEMIDE 10 MG/ML IJ SOLN
160.0000 mg | INTRAVENOUS | Status: AC
  Administered 2015-04-18: 160 mg via INTRAVENOUS
  Filled 2015-04-18: qty 16

## 2015-04-18 MED ORDER — TIMOLOL MALEATE 0.5 % OP SOLN
1.0000 [drp] | Freq: Two times a day (BID) | OPHTHALMIC | Status: DC
  Administered 2015-04-19 – 2015-04-28 (×19): 1 [drp] via OPHTHALMIC
  Filled 2015-04-18 (×2): qty 5

## 2015-04-18 MED ORDER — CALCITRIOL 0.25 MCG PO CAPS
0.2500 ug | ORAL_CAPSULE | ORAL | Status: DC
  Administered 2015-04-21 – 2015-04-27 (×4): 0.25 ug via ORAL
  Filled 2015-04-18 (×7): qty 1

## 2015-04-18 MED ORDER — FUROSEMIDE 10 MG/ML IJ SOLN
160.0000 mg | Freq: Four times a day (QID) | INTRAVENOUS | Status: DC
  Administered 2015-04-18 – 2015-04-23 (×18): 160 mg via INTRAVENOUS
  Filled 2015-04-18 (×23): qty 16

## 2015-04-18 MED ORDER — FENTANYL CITRATE (PF) 100 MCG/2ML IJ SOLN
50.0000 ug | INTRAMUSCULAR | Status: DC | PRN
  Administered 2015-04-19 – 2015-04-21 (×2): 50 ug via INTRAVENOUS
  Filled 2015-04-18 (×2): qty 2

## 2015-04-18 MED ORDER — SODIUM CHLORIDE 0.9 % IV SOLN
Freq: Once | INTRAVENOUS | Status: AC
  Administered 2015-04-18: 10 mL/h via INTRAVENOUS

## 2015-04-18 MED ORDER — OXYCODONE-ACETAMINOPHEN 5-325 MG PO TABS
0.5000 | ORAL_TABLET | Freq: Four times a day (QID) | ORAL | Status: DC | PRN
  Administered 2015-04-27 – 2015-04-28 (×2): 1 via ORAL
  Filled 2015-04-18 (×3): qty 1

## 2015-04-18 MED ORDER — SODIUM CHLORIDE 0.9 % IV SOLN
2.0000 g | Freq: Three times a day (TID) | INTRAVENOUS | Status: DC
  Administered 2015-04-18 – 2015-04-20 (×5): 2 g via INTRAVENOUS
  Filled 2015-04-18 (×10): qty 2000

## 2015-04-18 MED ORDER — ALLOPURINOL 100 MG PO TABS
200.0000 mg | ORAL_TABLET | Freq: Every day | ORAL | Status: DC
  Administered 2015-04-20 – 2015-04-28 (×8): 200 mg via ORAL
  Filled 2015-04-18 (×11): qty 2

## 2015-04-18 MED ORDER — NITROGLYCERIN 2 % TD OINT
1.0000 [in_us] | TOPICAL_OINTMENT | Freq: Once | TRANSDERMAL | Status: AC
  Administered 2015-04-18: 1 [in_us] via TOPICAL
  Filled 2015-04-18: qty 1

## 2015-04-18 MED ORDER — HYDROCORTISONE NA SUCCINATE PF 100 MG IJ SOLR
50.0000 mg | Freq: Three times a day (TID) | INTRAMUSCULAR | Status: DC
  Administered 2015-04-19 – 2015-04-21 (×5): 50 mg via INTRAVENOUS
  Filled 2015-04-18: qty 2
  Filled 2015-04-18: qty 1
  Filled 2015-04-18 (×3): qty 2
  Filled 2015-04-18 (×2): qty 1

## 2015-04-18 MED ORDER — ATORVASTATIN CALCIUM 40 MG PO TABS
40.0000 mg | ORAL_TABLET | Freq: Every day | ORAL | Status: DC
  Administered 2015-04-21 – 2015-04-28 (×8): 40 mg via ORAL
  Filled 2015-04-18 (×10): qty 1

## 2015-04-18 MED ORDER — FENTANYL CITRATE (PF) 100 MCG/2ML IJ SOLN
50.0000 ug | Freq: Once | INTRAMUSCULAR | Status: AC
  Administered 2015-04-18: 25 ug via INTRAVENOUS
  Filled 2015-04-18 (×2): qty 2

## 2015-04-18 MED ORDER — RANOLAZINE ER 500 MG PO TB12
500.0000 mg | ORAL_TABLET | Freq: Two times a day (BID) | ORAL | Status: DC
  Filled 2015-04-18 (×3): qty 1

## 2015-04-18 MED ORDER — LATANOPROST 0.005 % OP SOLN
1.0000 [drp] | Freq: Every day | OPHTHALMIC | Status: DC
  Administered 2015-04-19 – 2015-04-28 (×10): 1 [drp] via OPHTHALMIC
  Filled 2015-04-18 (×2): qty 2.5

## 2015-04-18 MED ORDER — HYDROCORTISONE NA SUCCINATE PF 100 MG IJ SOLR
50.0000 mg | Freq: Three times a day (TID) | INTRAMUSCULAR | Status: DC
  Administered 2015-04-18: 50 mg via INTRAVENOUS

## 2015-04-18 MED ORDER — HYDROCORTISONE NA SUCCINATE PF 100 MG IJ SOLR
50.0000 mg | Freq: Once | INTRAMUSCULAR | Status: AC
  Administered 2015-04-18: 50 mg via INTRAVENOUS
  Filled 2015-04-18: qty 2

## 2015-04-18 MED ORDER — SODIUM CHLORIDE 0.9% FLUSH
3.0000 mL | Freq: Two times a day (BID) | INTRAVENOUS | Status: DC
  Administered 2015-04-18 – 2015-04-20 (×3): 3 mL via INTRAVENOUS

## 2015-04-18 MED ORDER — LEVALBUTEROL HCL 1.25 MG/0.5ML IN NEBU
1.2500 mg | INHALATION_SOLUTION | Freq: Four times a day (QID) | RESPIRATORY_TRACT | Status: DC
Start: 2015-04-18 — End: 2015-04-18
  Filled 2015-04-18 (×3): qty 0.5

## 2015-04-18 MED ORDER — NITROGLYCERIN IN D5W 200-5 MCG/ML-% IV SOLN: 0.0000 ug/min | INTRAVENOUS | Status: DC

## 2015-04-18 MED ORDER — ONDANSETRON HCL 4 MG/2ML IJ SOLN: 4.0000 mg | Freq: Four times a day (QID) | INTRAMUSCULAR | Status: DC | PRN

## 2015-04-18 MED ORDER — ACETAMINOPHEN 650 MG RE SUPP: 650.0000 mg | Freq: Four times a day (QID) | RECTAL | Status: DC | PRN

## 2015-04-18 MED ORDER — NITROGLYCERIN IN D5W 200-5 MCG/ML-% IV SOLN
0.0000 ug/min | Freq: Once | INTRAVENOUS | Status: AC
  Administered 2015-04-18: 5 ug/min via INTRAVENOUS
  Filled 2015-04-18: qty 250

## 2015-04-18 MED ORDER — ACETAMINOPHEN 325 MG PO TABS: 650.0000 mg | ORAL_TABLET | Freq: Four times a day (QID) | ORAL | Status: DC | PRN

## 2015-04-18 MED ORDER — SODIUM CHLORIDE 0.9% FLUSH: 3.0000 mL | INTRAVENOUS | Status: DC | PRN

## 2015-04-18 MED ORDER — SODIUM CHLORIDE 0.9 % IV SOLN
125.0000 mg | Freq: Every day | INTRAVENOUS | Status: AC
  Administered 2015-04-19 – 2015-04-25 (×7): 125 mg via INTRAVENOUS
  Filled 2015-04-18 (×15): qty 10

## 2015-04-18 MED ORDER — CARVEDILOL 6.25 MG PO TABS
6.2500 mg | ORAL_TABLET | Freq: Two times a day (BID) | ORAL | Status: DC
  Filled 2015-04-18: qty 0.5

## 2015-04-18 MED ORDER — SODIUM BICARBONATE 650 MG PO TABS
1300.0000 mg | ORAL_TABLET | Freq: Two times a day (BID) | ORAL | Status: DC
  Filled 2015-04-18 (×2): qty 2

## 2015-04-18 MED ORDER — SODIUM CHLORIDE 0.9 % IV SOLN: 10.0000 mL/h | Freq: Once | INTRAVENOUS | Status: DC

## 2015-04-18 NOTE — Progress Notes (Signed)
Patient ID: Debbie [name], female   DOB: 01-Aug-1950, 65 y.o.   MRN: [id number] Patient understand Transplant can and will eventually fail.  She agrees to dialysis if needed.  Will consider cardiac cath.

## 2015-04-18 NOTE — ED Notes (Signed)
PICC line at left [facility] accessed after blood return.Family states last used 2 days ago.

## 2015-04-18 NOTE — Progress Notes (Signed)
Attempted ABG X2 was unsuccessful, another RT called did not attempt due to unable to palpate a pulse, RN notified, attempted to call Dr. Marily Memos was not able to reach him.

## 2015-04-18 NOTE — ED Notes (Signed)
Dr. Towanda Octave at bedside and he states to stop blood transfusion. Same done.

## 2015-04-18 NOTE — ED Notes (Signed)
Pharmacy contacted to adjust times for ampicillin, lasix, and solucortef.

## 2015-04-18 NOTE — ED Notes (Signed)
Pt arrives EMS with c/o chest pain. Started at 0330 and relieved with one sl. Nitro happened again x 2 during noight and again relieved with nitro sl x 1. Pt c/o chest pain on waking and given one nitro sl by EMS with relief. Also give ASA 324 mg in route. C/o returning chest pain on arrival.

## 2015-04-18 NOTE — ED Notes (Signed)
Called to room. Pt states she is struggling to breath.Calls out for "Jesus" Dr.Gobel aware.  Family at bedside.Lung appear to have decreased excursion. Sat 87on 2l /[place]. Increased to 4l

## 2015-04-18 NOTE — ED Notes (Signed)
Pt repositioned for comfort

## 2015-04-18 NOTE — ED Notes (Signed)
Pts family called out stating patient was struggling to get enough oxygen. Pts appeared to have labored breathing , O2 sats at that time was 95%. Respiratory was then paged and asked to begin Bipap. Respiratory came to bedside to assess patient and Bipap to be initiated.

## 2015-04-18 NOTE — ED Notes (Signed)
Replaced patient pressure dressing on buttocks for multiple decubitus ulcers that were bleeding through the previous bandage and adhering to patient's briefs/diaper

## 2015-04-18 NOTE — ED Notes (Addendum)
MD at bedside.BP very labile with meds.

## 2015-04-18 NOTE — Progress Notes (Signed)
Update:  Patient deconditioned quite significantly since riding H&P. Of note patients developed worsening shortness of breath and respiratory failure prior to administration of blood transfusion. Stat x-ray and ABG and BiPAP ordered. Chest x-ray concerning for worsening of pulmonary edema versus ARDS. Nephrology also saw patient around this time and ordered 160 mg of IV Lasix to be given every 6 hours. I went in to evaluate the patient after she been on BiPAP for approximately one hour. She is breathing much better and significantly less anxious. Initiated discussion again with patient regarding treatment options given the fact that she has several life-threatening conditions including worsening renal function previously refusing dialysis, severe hypotension, GI bleed with hemoglobin of 7, congestive heart failure and acute decompensation. Patient has now changed her mind with regards to dialysis and wants to undergo dialysis if needed if her renal function fails. At this time we will continue with diuresis as outlined by nephrology. Cardiology has been made aware the patient would be amenable for cardiac catheterization if needed. Nitroglycerin has been stopped due to hypotension. Critical care was consulted over the phone and will assist if needed. Troponin and ABG are still pending. Family updated at bedside. Patient aware that due to her multiple chronic comorbidities and acuity of current illness she may not make it through the night or 1-2 days. Refer the patient is DO NOT INTUBATE  Linna Darner, MD [place] 04/10/2015, 6:06 PM

## 2015-04-18 NOTE — H&P (Addendum)
[facility] History and Physical  FLORENE MANRIQUEZ [id number] DOB: Apr 29, 1950 DOA: 04/04/2015  Referring physician: Dr Tomi Bamberger - [facility] PCP: No PCP Per Patient   Chief Complaint: CP  HPI: Debbie [name] is a 65 y.o. female  Chest pain, midsternal to left chest. Intermittent since being discharged from hospital last week. Comes on acutely. Comes on at rest but also with exertion. Patient states that this chest pain feels like her previous heart attack chest pain. Improved with nitroglycerin and aspirin. Associated with intermittent but progressive lower extremity swelling since last admission. Patient also states that she started wheezing since presenting here to the emergency room. Patient reports crampy abdominal pain which came on approximately 1 day ago associated with very dark loose stools which, with greater frequency. Continues her IV antibiotics for recent bacteremia.  Review of Systems:  Constitutional:  No night sweats, Fevers, chills HEENT:  No headaches, Difficulty swallowing,Tooth/dental problems,Sore throat, Cardio-vascular: Per [facility] GI:  No heartburn, indigestion, nausea, vomiting,  Resp:   No shortness of breath with exertion or at rest. No excess mucus, no productive cough, No non-productive cough, No coughing up of blood.No change in color of mucus. Skin:  no rash or lesions.  GU: no dysuria, change in color of urine, no urgency or frequency. No flank pain.  Musculoskeletal:   No joint pain or swelling. No decreased range of motion. No back pain.  Psych:  No change in mood or affect. No depression or anxiety. No memory loss.  Neuro:  No change in sensation, unilateral strength, or cognitive abilities  All other systems were reviewed and are negative.  Past Medical History  Diagnosis Date  . Kidney transplant as cause of abnormal reaction or later complication   . Hypertension   . Colon polyps   . Gall stones 1980  . Anti-phospholipid antibody syndrome  [facility])     (Based on hospital discharge summary march 2013)  . Paroxysmal atrial fibrillation (HCC)   . Carpal tunnel syndrome 2003    lt  . Esophageal stricture   . Hemorrhoids   . Renal disorder   . ESRD (end stage renal disease) on dialysis [facility])     "til I got my transplant in 2010"  . DVT (deep venous thrombosis) (HCC) 1990    LLE  . History of blood transfusion     "related to the lupus"  . Dyslipidemia 07/22/2013  . Coronary artery disease   . Heart murmur     mild MR on echo  . Pneumonia     "3 times now" (08/25/2013)  . GERD (gastroesophageal reflux disease)   . Arthritis     "fingers" (08/25/2013)  . Glaucoma, left eye   . Bilateral carotid artery stenosis     1-39%  . Pulmonary HTN (HCC)     PASP 33mmHg   Past Surgical History  Procedure Laterality Date  . Nephrectomy transplanted organ  05/2008  . Cholecystectomy  1980  . Hematoma evacuation  1998    "after they took peritoneal catheter out"  . Peritoneal catheter insertion    . Peritoneal catheter removal    . Carpal tunnel release Right 07/2001  . Arteriovenous graft placement    . Thrombectomy and revision of arterioventous (av) goretex  graft Right 07/1999; 04/2002; 09/2002; 12/2005; 09/2007;     upper arm/notes 07/31/1999; 05/18/2002; 10/07/2002; 01/14/2006; 09/14/2007;   . Right heart catheterization N/A 08/28/2013    Procedure: RIGHT HEART CATH;  Surgeon: Sinclair Grooms, MD;  Location: [place] CATH LAB;  Service: Cardiovascular;  Laterality: N/A;  . Tee without cardioversion N/A 03/22/2015    Procedure: TRANSESOPHAGEAL ECHOCARDIOGRAM (TEE);  Surgeon: Josue Hector, MD;  Location: [facility] ENDOSCOPY;  Service: Cardiovascular;  Laterality: N/A;   Social History:  reports that she quit smoking about 38 years ago. Her smoking use included Cigarettes. She smoked 0.00 packs per day for 30 years. She has never used smokeless tobacco. She reports that she does not drink alcohol or use illicit drugs.  Allergies  Allergen Reactions  .  Feraheme [Ferumoxytol] Other (See Comments)    Chest pain, pulsating 02/17/15: tolerated Nulecit  . Vancomycin Anaphylaxis, Itching, Swelling and Other (See Comments)    Tongue swell  . Dorzolamide Hcl-Timolol Mal Rash and Other (See Comments)    Eye pain  . Gentamycin [Gentamicin Sulfate] Itching and Swelling  . Latanoprost Rash and Other (See Comments)    Eye pain  . Cefazolin Itching  . Codeine Itching  . Hydrocodone-Acetaminophen Itching  . Penicillins Itching    *tolerated ampicillin during 02/2015 admission Has patient had a PCN reaction causing immediate rash, facial/tongue/throat swelling, SOB or lightheadedness with hypotension:No Has patient had a PCN reaction causing severe rash involving mucus membranes or skin necrosis:No Has patient had a PCN reaction that required hospitalization:No Has patient had a PCN reaction occurring within the last 10 years:No If all of the above answers are "NO", then may proceed with Cephalosporin use.     Family History  Problem Relation Age of Onset  . Hypertension Mother   . Heart disease Mother   . Colon cancer Neg Hx   . Rectal cancer Neg Hx   . Stomach cancer Neg Hx      Prior to Admission medications   Medication Sig Start Date End Date Taking? Authorizing Provider  acetaminophen (TYLENOL) 325 MG tablet Take 2 tablets (650 mg total) by mouth every 6 (six) hours as needed for mild pain (or Fever >/= 101). 02/21/15  Yes Modena Jansky, MD  allopurinol (ZYLOPRIM) 100 MG tablet Take 1 tablet (100 mg total) by mouth daily. 04/14/15  Yes Shanker Kristeen Mans, MD  ampicillin 2 g in sodium chloride 0.9 % 50 mL Inject 2 g into the vein every 8 (eight) hours. End Date 04/26/15 04/14/15  Yes Shanker Kristeen Mans, MD  atorvastatin (LIPITOR) 40 MG tablet Take 1 tablet (40 mg total) by mouth daily at 6 PM. 02/21/15  Yes Modena Jansky, MD  bimatoprost (LUMIGAN) 0.01 % SOLN Place 1 drop into both eyes at bedtime.  05/21/13  Yes Historical Provider, MD    Brinzolamide-Brimonidine (SIMBRINZA) 1-0.2 % SUSP Place 1 drop into the left eye 3 (three) times daily. 09/14/14  Yes Historical Provider, MD  calcitRIOL (ROCALTROL) 0.25 MCG capsule Take 0.25-0.5 mcg by mouth every other day. alternating dosages   Yes Historical Provider, MD  carvedilol (COREG) 6.25 MG tablet Take 1 tablet (6.25 mg total) by mouth 2 (two) times daily with a meal. 02/21/15  Yes Modena Jansky, MD  furosemide (LASIX) 40 MG tablet Take 2 tablets (80 mg total) by mouth daily. 04/14/15  Yes Shanker Kristeen Mans, MD  isosorbide mononitrate (IMDUR) 60 MG 24 hr tablet Take 1 tablet (60 mg total) by mouth daily. 02/21/15  Yes Modena Jansky, MD  MAGNESIUM-OXIDE 400 (241.3 MG) MG tablet TAKE 1 TABLET BY MOUTH TWICE DAILY 10/26/14  Yes Sueanne Margarita, MD  mycophenolate (MYFORTIC) 180 MG EC tablet Take 180 mg by mouth  2 (two) times daily.    Yes Historical Provider, MD  nitroGLYCERIN (NITROSTAT) 0.4 MG SL tablet Place 0.4 mg under the tongue every 5 (five) minutes as needed for chest pain.  08/26/13  Yes Historical Provider, MD  oxyCODONE-acetaminophen (PERCOCET/ROXICET) 5-325 MG tablet Take 0.5-1 tablets by mouth every 6 (six) hours as needed for moderate pain or severe pain. 04/14/15  Yes Shanker Kristeen Mans, MD  potassium chloride (K-DUR) 10 MEQ tablet Take 1 tablet (10 mEq total) by mouth daily. 04/14/15  Yes Shanker Kristeen Mans, MD  predniSONE (DELTASONE) 5 MG tablet Take 5 mg by mouth daily with breakfast.  04/24/12  Yes Historical Provider, MD  RANEXA 500 MG 12 hr tablet TAKE 1 TABLET BY MOUTH TWICE DAILY 01/11/15  Yes Sueanne Margarita, MD  ranitidine (ZANTAC) 150 MG tablet Take 150 mg by mouth daily.    Yes Historical Provider, MD  sodium bicarbonate 650 MG tablet Take 1,300 mg by mouth 2 (two) times daily.  07/13/11  Yes Historical Provider, MD  tacrolimus (PROGRAF) 1 MG capsule Take 2 mg by mouth 2 (two) times daily.    Yes Historical Provider, MD  timolol (TIMOPTIC) 0.5 % ophthalmic solution Place 1  drop into both eyes 2 (two) times daily. Reported on 03/13/2015 05/21/13  Yes Historical Provider, MD  warfarin (COUMADIN) 1 MG tablet Monday and Friday 0.5 mg and 1 mg on all other days-CURRENTLY ON HOLD AS INR STILL 4.54 AS OF 04/14/15-WOULD RESUME AT ABOVE DOSAGE WHEN INR IS CLOSE TO 2.5. NOTE ON AMPICILLIN TILL 4/4-WHICH LIKELY IS CAUSING VERY LABILE AND ELEVATED INR. 04/14/15  Yes Shanker Kristeen Mans, MD   Physical Exam: Filed Vitals:   04/14/2015 1450 04/01/2015 1458 04/13/2015 1503 03/24/2015 1505  BP: 110/88   127/94  Pulse: 95  100 100  Temp:      TempSrc:      Resp: 26  21 20   Height:      Weight:      SpO2: 92% 92% 94% 95%    Wt Readings from Last 3 Encounters:  04/14/2015 72.576 kg (160 lb)  04/14/15 70.761 kg (156 lb)  03/27/15 75.18 kg (165 lb 11.9 oz)    General: Comfortable and somewhat anxious Eyes:  PERRL, EOMI, normal lids, iris ENT:  grossly normal hearing, lips & tongue Neck:  no LAD, masses or thyromegaly Cardiovascular:  RRR, II/VI systolic murmur, 1+ pitting edema RLE, and 0-1+ in LLE Respiratory: No significant change in respiratory effort. A few intermittent wheezes with decreased breath sounds in the bases and crackles. Abdomen:  soft, ntnd Skin:  no rash or induration seen on limited exam Musculoskeletal:  grossly normal tone BUE/BLE Psychiatric:  grossly normal mood and affect, speech fluent and appropriate Neurologic:  CN 2-12 grossly intact, moves all extremities in coordinated fashion.          Labs on Admission:  Basic Metabolic Panel:  Recent Labs Lab 04/12/15 0405 04/13/15 0406 04/14/15 0520 04/02/2015 0912  NA 141 139 137 139  K 4.6 4.4 4.3 4.2  CL 101 101 101 104  CO2 28 27 28 22   GLUCOSE 124* 111* 106* 110*  BUN 37* 43* 41* 51*  CREATININE 2.75* 2.69* 2.62* 3.49*  CALCIUM 9.3 9.2 8.8* 9.4   Liver Function Tests: No results for input(s): AST, ALT, ALKPHOS, BILITOT, PROT, ALBUMIN in the last 168 hours. No results for input(s): LIPASE, AMYLASE  in the last 168 hours. No results for input(s): AMMONIA in the last 168 hours.  CBC:  Recent Labs Lab 04/12/15 0405 04/13/15 0406 04/14/15 0520 03/30/2015 0912  WBC 8.3 7.7 6.7 14.8*  NEUTROABS  --   --   --  10.2*  HGB 10.6* 9.8* 8.2* 7.0*  HCT 32.4* 31.6* 26.4* 22.3*  MCV 89.8 89.5 90.4 88.5  PLT 119* 109* 104* 174   Cardiac Enzymes: No results for input(s): CKTOTAL, CKMB, CKMBINDEX, TROPONINI in the last 168 hours.  BNP (last 3 results)  Recent Labs  04/06/15 1200 03/25/2015 0922  BNP 717.5* 1333.7*    ProBNP (last 3 results) No results for input(s): PROBNP in the last 8760 hours.   CREATININE: 3.49 mg/dL ABNORMAL (03/26/2015 0912) Estimated creatinine clearance - 14.6 mL/min  CBG: No results for input(s): GLUCAP in the last 168 hours.  Radiological Exams on Admission: Dg Chest Portable 1 View  04/07/2015  CLINICAL DATA:  Recurrent left-sided chest pain since 3:30 a.m. today, intermittent relief with nitroglycerin; history of atrial fibrillation, hypertension, and end-stage renal disease EXAM: PORTABLE CHEST 1 VIEW COMPARISON:  Chest x-ray of March 16th 2017 FINDINGS: The pulmonary interstitial markings remain increased but are slightly less conspicuous today. Confluent alveolar opacity in the left mid lung is stable. The heart is top-normal in size. The pulmonary vascularity is engorged but more distinct today. A PICC line tip projects over the midportion of the SVC. IMPRESSION: Mild pulmonary interstitial edema consistent with CHF. Atelectasis or pneumonia in the left mid lung. Slight overall improvement since the study of 11 days ago. Electronically Signed   By: David  Martinique M.D.   On: 04/20/2015 09:33      Assessment/Plan Active Problems:   CKD (chronic kidney disease), stage IV (HCC)   HTN (hypertension)   Renal transplant recipient   Acute renal failure superimposed on stage 4 chronic kidney disease (HCC)   Supratherapeutic INR   Enterococcal bacteremia    Hypotension   PAF (paroxysmal atrial fibrillation) (HCC)   Acute on chronic diastolic CHF (congestive heart failure), NYHA class 1 (HCC)   Wheezing   Acute GI bleeding  NSTEMI: Patient presenting with elevated troponins and concern for NSTEMI. Cardiology consult and evaluated patient. Patient is not a cardiac cath candidate at this time given her renal history. Medical management pursued with nitroglycerin drip. Anticipate this is largely due to her acute onset anemia in the setting of chronic coronary artery disease. This should improve significantly with blood transfusion as previously ordered. I personally called the blood bank to have her blood sent to the emergency room as soon as possible. Of note patient with NSTEMI in January 2017. - Stepdown - Follow cardiology recommendations - no cardiac catheterization at this time - Continue nitro drip - Continue ASA, statin, beta blocker,  - continue Imdur when off nitro - trend Trop  Acute on chronic anemia: Hgb 7. Baseline 10. Suspect GI loss given labile INR and report of melanic loose stools. 3Units PRBC ordered by EDP.  - Transfuse PRBC and f/u labs - FOBT - GI consult - hold Coumadin  Acute on chronic systolic/diastolic congestive heart failure: h/o PAF on coumadin. Echo from 03/14/2015 show EF of [id number] and diastolic dysfunction. BNP on presentation 1333 with pulmonary findings suggestive of CHF. Patient reports gradual worsening lower extremity edema. States she has been compliant with her Lasix. Given patient's worsening renal function and desire to never go on dialysis again we will hold her Lasix in the hope that she autodiuresis after receiving her PRBCs. - consider restarting lasix in am - continue bblocker  Hypotension: low pressures on admission. On chronic steroids adn on multiple antihypertensives. Suspect some element of adrenal insufficiency - Stress dose steroids (likely to benefit respiratory decompensation as well) - Hold  lasix and imdur tonight  Wheezing: no h/o COPD/Asthma, but w/ seasonal allergies. Wheezing on exam and feels SOB.  - Duonebs - steroids as above - O2 PRN  GI bleed: INR 3 on admission. Typically in the 4-10 range recently. Melanic loose stools w/ crampy abdominal pain.  - FOBT - GI consult in am if continues - Consider Vit K if continues to bleed.  AoCKD IV: s/p renal tranplant. Cr elevated to 3.4. Baseline 2.3. Pt REFUSING TO BACK ON DIALYSIS.  - hold lasix until tomorrow.  - continue prograf, mycophenolate, bicarb, mag - Stress dose steroids in place of home prednisone - nephrology consult - BMET in am  Bacteremia: Enterococcus w/ septic arthritis of R shoulder. TEE negative for vegetations. Refused I&D of R shoulder previously. ID followed previously. - continue ampicillin (end date 04/26/15)  ANtiphospholipid syndrome: on warfarin - Hold warfarin in setting of GI bleed and anemia  Code Status: DNI  DVT Prophylaxis: SCD Family Communication: 5+ family members. Disposition Plan: Pending Improvement    MERRELL, DAVID Lenna Sciara, MD Family Medicine [facility] [email or web address] Password [email or web address]

## 2015-04-18 NOTE — Consult Note (Signed)
Reason for Consult:Renal Transplant, AKI, fluid overload Referring Physician: Dr, [name] is an 65 y.o. female.  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  Had Renal Tx 2010.  Baseline Cr last yr 2.1-2.8.  Variable adherence to meds.  Since 01/23/15 has had fall with fib/malleolar fx, MI the last of Jan, prolonged hosp with Enterococcal sepsis/septic joint (still on AB),  NSTEMI in mid Mar.  Now presents with midsternal CP, SOB, Hb decreased to 7.   Worse SOB with blood infusing.  Has not gotten her ESA in weeks.  Had low Fe, in Jan. Recent dark stools and hx GIB.    Hx GERD, esophageal stricture, Gout, severe DJD of Knees, DVT in past, glaucoma, pneumonias.  Baseline Cr 2.6-2.8.  Now 3.4s. .Constitutional: as above Eyes: negative Ears, nose, mouth, throat, and face: dry mouth Respiratory: as above,. very SOB, progressive over several d,  Cardiovascular: as per HPI Gastrointestinal: as above Integument/breast: negative Hematologic/lymphatic: anemia and she knows Musculoskeletal:pain in Knees R>L, ,R ankle, R shoulder Neurological: negative Allergic/Immunologic: Debbie [name]   Past Medical History  Diagnosis Date  . Kidney transplant as cause of abnormal reaction or later complication   . Hypertension   . Colon polyps   . Gall stones 1980  . Anti-phospholipid antibody syndrome [facility])     (Based on hospital discharge summary march 2013)  . Paroxysmal atrial fibrillation (HCC)   . Carpal tunnel syndrome 2003    lt  . Esophageal stricture   . Hemorrhoids   . Renal disorder   . ESRD (end stage renal disease) on dialysis [facility])     "til I got my transplant in 2010"  . DVT (deep venous thrombosis) (HCC) 1990    LLE  . History of blood transfusion     "related to the lupus"  . Dyslipidemia 07/22/2013  . Coronary artery disease   . Heart murmur     mild MR on echo  . Pneumonia     "3 times now" (08/25/2013)  . GERD (gastroesophageal reflux disease)   .  Arthritis     "fingers" (08/25/2013)  . Glaucoma, left eye   . Bilateral carotid artery stenosis     1-39%  . Pulmonary HTN (HCC)     PASP 24mHg    Past Surgical History  Procedure Laterality Date  . Nephrectomy transplanted organ  05/2008  . Cholecystectomy  1980  . Hematoma evacuation  1998    "after they took peritoneal catheter out"  . Peritoneal catheter insertion    . Peritoneal catheter removal    . Carpal tunnel release Right 07/2001  . Arteriovenous graft placement    . Thrombectomy and revision of arterioventous (av) goretex  graft Right 07/1999; 04/2002; 09/2002; 12/2005; 09/2007;     upper arm/notes 07/31/1999; 05/18/2002; 10/07/2002; 01/14/2006; 09/14/2007;   . Right heart catheterization N/A 08/28/2013    Procedure: RIGHT HEART CATH;  Surgeon: HSinclair Grooms MD;  [facility];  Service: Cardiovascular;  Laterality: N/A;  . Tee without cardioversion N/A 03/22/2015    Procedure: TRANSESOPHAGEAL ECHOCARDIOGRAM (TEE);  Surgeon: PJosue Hector MD;  [facility];  Service: Cardiovascular;  Laterality: N/A;    Family History  Problem Relation Age of Onset  . Hypertension Mother   . Heart disease Mother   . Colon cancer Neg Hx   . Rectal cancer Neg Hx   . Stomach cancer Neg Hx     Social  History:  reports that she quit smoking about 38 years ago. Her smoking use included Cigarettes. She smoked 0.00 packs per day for 30 years. She has never used smokeless tobacco. She reports that she does not drink alcohol or use illicit drugs.  Allergies:  Allergies  Allergen Reactions  . Feraheme [Ferumoxytol] Other (See Comments)    Chest pain, pulsating 02/17/15: tolerated Nulecit  . Vancomycin Anaphylaxis, Itching, Swelling and Other (See Comments)    Tongue swell  . Dorzolamide Hcl-Timolol Mal Rash and Other (See Comments)    Eye pain  . Gentamycin [Gentamicin Sulfate] Itching and Swelling  . Latanoprost Rash and Other (See Comments)    Eye pain  . Cefazolin  Itching  . Codeine Itching  . Hydrocodone-Acetaminophen Itching  . Penicillins Itching    *tolerated ampicillin during 02/2015 admission Has patient had a PCN reaction causing immediate rash, facial/tongue/throat swelling, SOB or lightheadedness with hypotension:No Has patient had a PCN reaction causing severe rash involving mucus membranes or skin necrosis:No Has patient had a PCN reaction that required hospitalization:No Has patient had a PCN reaction occurring within the last 10 years:No If all of the above answers are "NO", then may proceed with Cephalosporin use.      Calcitriol .25 alt with .35mg qd. , Procrit on but not recent, . , Venofer as needed.  Results for orders placed or performed during the hospital encounter of 04/12/2015 (from the past 48 hour(s))  CBC with Differential     Status: Abnormal   Collection Time: 04/13/2015  9:12 AM  Result Value Ref Range   WBC 14.8 (H) 4.0 - 10.5 K/uL   RBC 2.52 (L) 3.87 - 5.11 MIL/uL   Hemoglobin 7.0 (L) 12.0 - 15.0 g/dL   HCT 22.3 (L) 36.0 - 46.0 %   MCV 88.5 78.0 - 100.0 fL   MCH 27.8 26.0 - 34.0 pg   MCHC 31.4 30.0 - 36.0 g/dL   RDW 17.1 (H) 11.5 - 15.5 %   Platelets 174 150 - 400 K/uL   Neutrophils Relative % 69 %   Lymphocytes Relative 20 %   Monocytes Relative 7 %   Eosinophils Relative 4 %   Basophils Relative 0 %   Neutro Abs 10.2 (H) 1.7 - 7.7 K/uL   Lymphs Abs 3.0 0.7 - 4.0 K/uL   Monocytes Absolute 1.0 0.1 - 1.0 K/uL   Eosinophils Absolute 0.6 0.0 - 0.7 K/uL   Basophils Absolute 0.0 0.0 - 0.1 K/uL   RBC Morphology BASOPHILIC STIPPLING     Comment: POLYCHROMASIA PRESENT  Basic metabolic panel     Status: Abnormal   Collection Time: 03/31/2015  9:12 AM  Result Value Ref Range   Sodium 139 135 - 145 mmol/L   Potassium 4.2 3.5 - 5.1 mmol/L   Chloride 104 101 - 111 mmol/L   CO2 22 22 - 32 mmol/L   Glucose, Bld 110 (H) 65 - 99 mg/dL   BUN 51 (H) 6 - 20 mg/dL   Creatinine, Ser 3.49 (H) 0.44 - 1.00 mg/dL   Calcium 9.4  8.9 - 10.3 mg/dL   GFR calc non Af Amer 13 (L) >60 mL/min   GFR calc Af Amer 15 (L) >60 mL/min    Comment: (NOTE) The eGFR has been calculated using the CKD EPI equation. This calculation has not been validated in all clinical situations. eGFR's persistently <60 mL/min signify possible Chronic Kidney Disease.    Anion gap 13 5 - 15  Protime-INR  Status: Abnormal   Collection Time: 03/27/2015  9:12 AM  Result Value Ref Range   Prothrombin Time 30.6 (H) 11.6 - 15.2 seconds   INR 3.00 (H) 0.00 - 1.49  I-stat troponin, ED     Status: Abnormal   Collection Time: 03/30/2015  9:13 AM  Result Value Ref Range   Troponin i, poc 0.63 (HH) 0.00 - 0.08 ng/mL   Comment NOTIFIED PHYSICIAN    Comment 3            Comment: Due to the release kinetics of cTnI, a negative result within the first hours of the onset of symptoms does not rule out myocardial infarction with certainty. If myocardial infarction is still suspected, repeat the test at appropriate intervals.   Brain natriuretic peptide     Status: Abnormal   Collection Time: 03/27/2015  9:22 AM  Result Value Ref Range   B Natriuretic Peptide 1333.7 (H) 0.0 - 100.0 pg/mL  Type and screen [place]     Status: None (Preliminary result)   Collection Time: 04/05/2015 12:30 PM  Result Value Ref Range   ABO/RH(D) B POS    Antibody Screen NEG    Sample Expiration 04/21/2015    Unit Number [id number]    Blood Component Type RED CELLS,LR    Unit division 00    Status of Unit ISSUED    Transfusion Status OK TO TRANSFUSE    Crossmatch Result Compatible    Unit Number [id number]    Blood Component Type RED CELLS,LR    Unit division 00    Status of Unit ALLOCATED    Transfusion Status OK TO TRANSFUSE    Crossmatch Result Compatible    Unit Number [id number]    Blood Component Type RED CELLS,LR    Unit division 00    Status of Unit ALLOCATED    Transfusion Status OK TO TRANSFUSE    Crossmatch Result Compatible    Prepare RBC     Status: None   Collection Time: 04/10/2015 12:50 PM  Result Value Ref Range   Order Confirmation ORDER PROCESSED BY BLOOD BANK   Prepare RBC     Status: None   Collection Time: 03/24/2015 12:50 PM  Result Value Ref Range   Order Confirmation ORDER PROCESSED BY BLOOD BANK     Dg Chest Portable 1 View  04/17/2015  CLINICAL DATA:  Recurrent left-sided chest pain since 3:30 a.m. today, intermittent relief with nitroglycerin; history of atrial fibrillation, hypertension, and end-stage renal disease EXAM: PORTABLE CHEST 1 VIEW COMPARISON:  Chest x-ray of March 16th 2017 FINDINGS: The pulmonary interstitial markings remain increased but are slightly less conspicuous today. Confluent alveolar opacity in the left mid lung is stable. The heart is top-normal in size. The pulmonary vascularity is engorged but more distinct today. A PICC line tip projects over the midportion of the SVC. IMPRESSION: Mild pulmonary interstitial edema consistent with CHF. Atelectasis or pneumonia in the left mid lung. Slight overall improvement since the study of 11 days ago. Electronically Signed   By: David  Martinique M.D.   On: 04/05/2015 09:33    ROS Blood pressure 91/70, pulse 93, temperature 97.7 F (36.5 C), temperature source Oral, resp. rate 21, height 5' 1"  (1.549 m), weight 72.576 kg (160 lb), SpO2 100 %. Physical Exam Physical Examination: General appearance - slowed but OX3 Mental status - slowed, but oriented Eyes - olc scarring in fundi Mouth - mucous membranes moist, pharynx normal without lesions Neck - adenopathy noted  PCL Lymphatics - no palpable lymphadenopathy, no hepatosplenomegaly, posterior cervical nodes Chest - diffuse rales, and rhonchi. Heart - S1 and S2 normal, systolic murmur TK1/6 at apex Abdomen - pos bs, soft, liver down 5 cm.  TX normal size and nontender Musculoskeletal - Rshoulder tender.  R knee deformed with hypertrophic changes, L less, R ankle deformed Extremities -  pedal edema 3_ +, Rarm edematous, PICC L arm Skin - thin, bruises, striae abdm  Assessment/Plan: 1 Fluid xs, needs intense diuresis.  TX worsening it and hold now .  Soyla Dryer with stress, vol,  2 Renal TX with mild worsening.  Vol xs.   3 Hypertension: not an issues 4. Anemia Tx and give fe and ESA 5. Metabolic Bone Disease: check PTH 6 Recent enterococcus sepsis. 7 PAF 8 Gout  Needs Allopurinol 9 ^ INR  10 GIB  Need to address 11 JW 12 SLE 13 antiphospholipid syndrome  Chronic anticoag P Lasix, Tx, Prograf level, Fe, ESA,  Vit D   Debbie [name] L 04/08/2015, 4:23 PM

## 2015-04-18 NOTE — ED Notes (Signed)
Dr. Marily Memos contacted by phone. He states this is not a transfusion reaction. Is aware of BP of 68 and states to continue with lasix infusing. Is aware that nitro has been stopped.

## 2015-04-18 NOTE — ED Provider Notes (Signed)
CSN: [id number]     Arrival date & time 04/01/2015  0831 History   First MD Initiated Contact with Patient 04/06/[id number]     Chief Complaint  Patient presents with  . Chest Pain     (Consider location/radiation/quality/duration/timing/severity/associated sxs/prior Treatment) Patient is a 65 y.o. female presenting with chest pain. The history is provided by the patient.  Chest Pain Pain location:  L chest Pain quality comment:  Patient unable to specify Pain radiates to the back: no   Pain severity:  Severe Onset quality:  Sudden Duration:  5 hours (woke her up out of sleep at 3:30) Progression:  Worsening Chronicity:  Recurrent (Hx of MI, feels like same) Context: at rest (sleeping)   Relieved by:  Nitroglycerin and aspirin Associated symptoms: no abdominal pain, no back pain, no cough, no dysphagia, no fever, no headache, no palpitations and no shortness of breath   Risk factors: coronary artery disease     Past Medical History  Diagnosis Date  . Kidney transplant as cause of abnormal reaction or later complication   . Hypertension   . Colon polyps   . Gall stones 1980  . Anti-phospholipid antibody syndrome [facility])     (Based on hospital discharge summary march 2013)  . Paroxysmal atrial fibrillation (HCC)   . Carpal tunnel syndrome 2003    lt  . Esophageal stricture   . Hemorrhoids   . Renal disorder   . ESRD (end stage renal disease) on dialysis [facility])     "til I got my transplant in 2010"  . DVT (deep venous thrombosis) (HCC) 1990    LLE  . History of blood transfusion     "related to the lupus"  . Dyslipidemia 07/22/2013  . Coronary artery disease   . Heart murmur     mild MR on echo  . Pneumonia     "3 times now" (08/25/2013)  . GERD (gastroesophageal reflux disease)   . Arthritis     "fingers" (08/25/2013)  . Glaucoma, left eye   . Bilateral carotid artery stenosis     1-39%  . Pulmonary HTN (HCC)     PASP 64mmHg   Past Surgical History  Procedure Laterality  Date  . Nephrectomy transplanted organ  05/2008  . Cholecystectomy  1980  . Hematoma evacuation  1998    "after they took peritoneal catheter out"  . Peritoneal catheter insertion    . Peritoneal catheter removal    . Carpal tunnel release Right 07/2001  . Arteriovenous graft placement    . Thrombectomy and revision of arterioventous (av) goretex  graft Right 07/1999; 04/2002; 09/2002; 12/2005; 09/2007;     upper arm/notes 07/31/1999; 05/18/2002; 10/07/2002; 01/14/2006; 09/14/2007;   . Right heart catheterization N/A 08/28/2013    Procedure: RIGHT HEART CATH;  Surgeon: Sinclair Grooms, MD;  Location: [facility] CATH LAB;  Service: Cardiovascular;  Laterality: N/A;  . Tee without cardioversion N/A 03/22/2015    Procedure: TRANSESOPHAGEAL ECHOCARDIOGRAM (TEE);  Surgeon: Josue Hector, MD;  Location: [facility] ENDOSCOPY;  Service: Cardiovascular;  Laterality: N/A;   Family History  Problem Relation Age of Onset  . Hypertension Mother   . Heart disease Mother   . Colon cancer Neg Hx   . Rectal cancer Neg Hx   . Stomach cancer Neg Hx    Social History  Substance Use Topics  . Smoking status: Former Smoker -- 0.00 packs/day for 30 years    Types: Cigarettes    Quit date: 04/16/1977  .  Smokeless tobacco: Never Used  . Alcohol Use: No   OB History    Gravida Para Term Preterm AB TAB SAB Ectopic Multiple Living   1              Review of Systems  Constitutional: Negative for fever and chills.  HENT: Negative for facial swelling, rhinorrhea, sore throat, trouble swallowing and voice change.   Eyes: Negative for photophobia, pain and visual disturbance.  Respiratory: Negative for cough, shortness of breath, wheezing and stridor.   Cardiovascular: Positive for chest pain and leg swelling. Negative for palpitations.  Gastrointestinal: Negative for abdominal pain, constipation and anal bleeding.  Endocrine: Negative.   Genitourinary: Negative for dysuria, vaginal bleeding, vaginal discharge and vaginal pain.   Musculoskeletal: Negative for myalgias, back pain and arthralgias.  Skin: Negative.  Negative for rash.  Allergic/Immunologic: Negative.   Neurological: Negative for tremors, syncope and headaches.  Psychiatric/Behavioral: Negative for suicidal ideas, sleep disturbance and self-injury.  All other systems reviewed and are negative.     Allergies  Feraheme; Vancomycin; Dorzolamide hcl-timolol mal; Gentamycin; Latanoprost; Cefazolin; Codeine; Hydrocodone-acetaminophen; and Penicillins  Home Medications   Prior to Admission medications   Medication Sig Start Date End Date Taking? Authorizing Provider  acetaminophen (TYLENOL) 325 MG tablet Take 2 tablets (650 mg total) by mouth every 6 (six) hours as needed for mild pain (or Fever >/= 101). 02/21/15  Yes Modena Jansky, MD  allopurinol (ZYLOPRIM) 100 MG tablet Take 1 tablet (100 mg total) by mouth daily. 04/14/15  Yes Shanker Kristeen Mans, MD  ampicillin 2 g in sodium chloride 0.9 % 50 mL Inject 2 g into the vein every 8 (eight) hours. End Date 04/26/15 04/14/15  Yes Shanker Kristeen Mans, MD  atorvastatin (LIPITOR) 40 MG tablet Take 1 tablet (40 mg total) by mouth daily at 6 PM. 02/21/15  Yes Modena Jansky, MD  bimatoprost (LUMIGAN) 0.01 % SOLN Place 1 drop into both eyes at bedtime.  05/21/13  Yes Historical Provider, MD  Brinzolamide-Brimonidine (SIMBRINZA) 1-0.2 % SUSP Place 1 drop into the left eye 3 (three) times daily. 09/14/14  Yes Historical Provider, MD  calcitRIOL (ROCALTROL) 0.25 MCG capsule Take 0.25-0.5 mcg by mouth every other day. alternating dosages   Yes Historical Provider, MD  carvedilol (COREG) 6.25 MG tablet Take 1 tablet (6.25 mg total) by mouth 2 (two) times daily with a meal. 02/21/15  Yes Modena Jansky, MD  furosemide (LASIX) 40 MG tablet Take 2 tablets (80 mg total) by mouth daily. 04/14/15  Yes Shanker Kristeen Mans, MD  isosorbide mononitrate (IMDUR) 60 MG 24 hr tablet Take 1 tablet (60 mg total) by mouth daily. 02/21/15  Yes  Modena Jansky, MD  MAGNESIUM-OXIDE 400 (241.3 MG) MG tablet TAKE 1 TABLET BY MOUTH TWICE DAILY 10/26/14  Yes Sueanne Margarita, MD  mycophenolate (MYFORTIC) 180 MG EC tablet Take 180 mg by mouth 2 (two) times daily.    Yes Historical Provider, MD  nitroGLYCERIN (NITROSTAT) 0.4 MG SL tablet Place 0.4 mg under the tongue every 5 (five) minutes as needed for chest pain.  08/26/13  Yes Historical Provider, MD  oxyCODONE-acetaminophen (PERCOCET/ROXICET) 5-325 MG tablet Take 0.5-1 tablets by mouth every 6 (six) hours as needed for moderate pain or severe pain. 04/14/15  Yes Shanker Kristeen Mans, MD  potassium chloride (K-DUR) 10 MEQ tablet Take 1 tablet (10 mEq total) by mouth daily. 04/14/15  Yes Shanker Kristeen Mans, MD  predniSONE (DELTASONE) 5 MG tablet Take 5 mg  by mouth daily with breakfast.  04/24/12  Yes Historical Provider, MD  RANEXA 500 MG 12 hr tablet TAKE 1 TABLET BY MOUTH TWICE DAILY 01/11/15  Yes Sueanne Margarita, MD  ranitidine (ZANTAC) 150 MG tablet Take 150 mg by mouth daily.    Yes Historical Provider, MD  sodium bicarbonate 650 MG tablet Take 1,300 mg by mouth 2 (two) times daily.  07/13/11  Yes Historical Provider, MD  tacrolimus (PROGRAF) 1 MG capsule Take 2 mg by mouth 2 (two) times daily.    Yes Historical Provider, MD  timolol (TIMOPTIC) 0.5 % ophthalmic solution Place 1 drop into both eyes 2 (two) times daily. Reported on 03/13/2015 05/21/13  Yes Historical Provider, MD  warfarin (COUMADIN) 1 MG tablet Monday and Friday 0.5 mg and 1 mg on all other days-CURRENTLY ON HOLD AS INR STILL 4.54 AS OF 04/14/15-WOULD RESUME AT ABOVE DOSAGE WHEN INR IS CLOSE TO 2.5. NOTE ON AMPICILLIN TILL 4/4-WHICH LIKELY IS CAUSING VERY LABILE AND ELEVATED INR. 04/14/15  Yes Shanker Kristeen Mans, MD   BP 115/96 mmHg  Pulse 121  Temp(Src) 97.5 F (36.4 C) (Oral)  Resp 23  Ht 5\' 1"  (1.549 m)  Wt 72.576 kg  BMI 30.25 kg/m2  SpO2 99%  LMP  (LMP Unknown) Physical Exam  Constitutional: She is oriented to person, place, and  time. She appears well-developed and well-nourished. She appears distressed (patient in moderate distress from chest pain).  HENT:  Head: Normocephalic and atraumatic.  Right Ear: External ear normal.  Left Ear: External ear normal.  Mouth/Throat: Oropharynx is clear and moist. No oropharyngeal exudate.  Eyes: Conjunctivae and EOM are normal. Pupils are equal, round, and reactive to light. No scleral icterus.  Neck: Normal range of motion. Neck supple. No JVD present. No tracheal deviation present. No thyromegaly present.  Cardiovascular: Normal rate, regular rhythm and intact distal pulses.   Murmur heard. Pulmonary/Chest: Effort normal and breath sounds normal. No respiratory distress. She has no wheezes. She has no rales.  Abdominal: Soft. Bowel sounds are normal. She exhibits no distension. There is no tenderness.  Musculoskeletal: Normal range of motion. She exhibits edema. She exhibits no tenderness.  Neurological: She is alert and oriented to person, place, and time. No cranial nerve deficit. She exhibits normal muscle tone. Coordination normal.  GCS 15  Skin: Skin is warm and dry. She is not diaphoretic. No pallor.  Psychiatric: She has a normal mood and affect. She expresses no homicidal and no suicidal ideation. She expresses no suicidal plans and no homicidal plans.  Nursing note and vitals reviewed.   ED Course  Procedures (including critical care time) Labs Review Labs Reviewed  CBC WITH DIFFERENTIAL/PLATELET - Abnormal; Notable for the following:    WBC 14.8 (*)    RBC 2.52 (*)    Hemoglobin 7.0 (*)    HCT 22.3 (*)    RDW 17.1 (*)    Neutro Abs 10.2 (*)    All other components within normal limits  BASIC METABOLIC PANEL - Abnormal; Notable for the following:    Glucose, Bld 110 (*)    BUN 51 (*)    Creatinine, Ser 3.49 (*)    GFR calc non Af Amer 13 (*)    GFR calc Af Amer 15 (*)    All other components within normal limits  PROTIME-INR - Abnormal; Notable for the  following:    Prothrombin Time 30.6 (*)    INR 3.00 (*)    All other components within  normal limits  BRAIN NATRIURETIC PEPTIDE - Abnormal; Notable for the following:    B Natriuretic Peptide 1333.7 (*)    All other components within normal limits  I-STAT TROPOININ, ED - Abnormal; Notable for the following:    Troponin i, poc 0.63 (*)    All other components within normal limits  MAGNESIUM  PHOSPHORUS  CBC  BLOOD GAS, ARTERIAL  TROPONIN I  PREPARE RBC (CROSSMATCH)  TYPE AND SCREEN  PREPARE RBC (CROSSMATCH)    Imaging Review Dg Chest Portable 1 View  03/25/2015  CLINICAL DATA:  Recurrent left-sided chest pain since 3:30 a.m. today, intermittent relief with nitroglycerin; history of atrial fibrillation, hypertension, and end-stage renal disease EXAM: PORTABLE CHEST 1 VIEW COMPARISON:  Chest x-ray of March 16th 2017 FINDINGS: The pulmonary interstitial markings remain increased but are slightly less conspicuous today. Confluent alveolar opacity in the left mid lung is stable. The heart is top-normal in size. The pulmonary vascularity is engorged but more distinct today. A PICC line tip projects over the midportion of the SVC. IMPRESSION: Mild pulmonary interstitial edema consistent with CHF. Atelectasis or pneumonia in the left mid lung. Slight overall improvement since the study of 11 days ago. Electronically Signed   By: David  Martinique M.D.   On: 03/24/2015 09:33   I have personally reviewed and evaluated these images and lab results as part of my medical decision-making.   EKG Interpretation   Date/Time:  Monday April 18 2015 08:32:13 EDT Ventricular Rate:  89 PR Interval:  178 QRS Duration: 111 QT Interval:  406 QTC Calculation: 494 R Axis:   -20 Text Interpretation:  Sinus rhythm LVH with secondary repolarization  abnormality Anterior Q waves, possibly due to LVH No significant change  since last tracing Confirmed by KNAPP  MD-J, JON [id number]) on 03/24/2015  8:50:53 AM       MDM   Final diagnoses:  NSTEMI (non-ST elevated myocardial infarction) ([place])  SOB (shortness of breath)    The patient is a 65 year old female with a history of coronary artery disease and chronic kidney disease status post kidney transplant with recent admission for presumed STEMI who presents for recurrent chest pain starting at 3:30 this morning. ASA given by EMS en route to the ED. No evidence of ST elevation on EKG. Patient's pain was relieved with nitroglycerin however returns and requires a nitroglycerin drip. Troponin elevated to 0.6. INR is 3.0 do not feel further anticoagulation necessary. Cardiology is consulted and sees the patient in the ED, please see their note for more detail. Patient is admitted to the hospitalist service to the stepdown unit with cardiology following along. Patient and family express understanding and agreement with this plan.  Patient seen with attending, Dr. Tomi Bamberger, who oversaw clinical decision making.      Margaretann Loveless, MD 04/19/2015 [id number]  Dorie Rank, MD 03/29/2015 [phone]

## 2015-04-18 NOTE — Progress Notes (Signed)
RT NOTE:  Switched BIPAP mask to a small mask for comfort and leak purpose.

## 2015-04-18 NOTE — ED Provider Notes (Addendum)
Patient presents to the emergency room with complaints of severe 8 out of 10 chest pain. Patient has a complicated medical history that includes chronic renal failure resulting in a kidney transplant. She also has a history of ischemic heart disease and was recently admitted to the hospital earlier this month for non-ST elevation MI. Patient has not been a candidate for transplant because of her chronic kidney disease. This morning she presents to the emergency room with recurrent severe chest pain. Nitroglycerin earlier helped with the symptoms but it has returned and worsened.  Physical Exam  BP 118/71 mmHg  Pulse 90  Temp(Src) 98.1 F (36.7 C) (Oral)  Resp 14  Ht 5\' 1"  (1.549 m)  Wt 72.576 kg  BMI 30.25 kg/m2  SpO2 100%  LMP  (LMP Unknown)  Physical Exam  Constitutional: She appears distressed.  Chronically ill-appearing, appears older than her age  HENT:  Head: Normocephalic and atraumatic.  Right Ear: External ear normal.  Left Ear: External ear normal.  Eyes: Conjunctivae are normal. Right eye exhibits no discharge. Left eye exhibits no discharge. No scleral icterus.  Neck: Neck supple. No tracheal deviation present.  Cardiovascular: Normal rate.   Pulmonary/Chest: Effort normal. No stridor. No respiratory distress.  Musculoskeletal: She exhibits edema.  Neurological: She is alert. Cranial nerve deficit: no gross deficits.  Skin: Skin is warm and dry. No rash noted.  Psychiatric: She has a normal mood and affect.  Nursing note and vitals reviewed.   ED Course  Procedures  EKG Interpretation  Date/Time:  Monday April 18 2015 08:32:13 EDT Ventricular Rate:  89 PR Interval:  178 QRS Duration: 111 QT Interval:  406 QTC Calculation: 494 R Axis:   -20 Text Interpretation:  Sinus rhythm LVH with secondary repolarization abnormality Anterior Q waves, possibly due to LVH No significant change since last tracing Confirmed by Sallyanne Birkhead  MD-J, Quanah Majka [id number]) on 04/06/2015 8:50:53 AM       CRITICAL CARE Performed by: [id number] Total critical care time: 35 minutes Critical care time was exclusive of separately billable procedures and treating other patients. Critical care was necessary to treat or prevent imminent or life-threatening deterioration. Critical care was time spent personally by me on the following activities: development of treatment plan with patient and/or surrogate as well as nursing, discussions with consultants, evaluation of patient's response to treatment, examination of patient, obtaining history from patient or surrogate, ordering and performing treatments and interventions, ordering and review of laboratory studies, ordering and review of radiographic studies, pulse oximetry and re-evaluation of patient's condition.   MDM Sx are concerning for recurrent cardiac ischemia.  No stemi noted on EKG.  Will give NTG, fentanyl for pain.  Check labs, xrays.  Plan on consultation with cardiology.  Labs shows elevated troponin.  Pt is having persistent pain.  Concerning for nstemi.  Continue fentanyl for comfort and titrate nitro drop if her BP toelrated.  Cardiology is coming to see the patient.  Dorie Rank, MD 03/25/2015 1051  Pt is not a candidate for cath.   Seen by cardiology. Recommends medical admission, medical management.  Will transfuse blood with her worsening anemia.    Dorie Rank, MD 03/29/2015 1200

## 2015-04-18 NOTE — Consult Note (Signed)
Cardiology Consultation    Patient ID: Debbie [name] MRN: [id number], DOB/AGE: Sep 30, 1950   Admit date: 04/05/2015 Date of Admission: 03/23/2015  Primary Physician: No PCP Per Patient Reason for Consult: Chest Pain Primary Cardiologist: Dr. Radford Pax Requesting Provider: Dr. Tomi Bamberger   History of Present Illness   Debbie [name] is a 65 y.o. 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  She was recently admitted form 02/14/2015 - 02/21/2015 for dizziness and found to have AKI. She had an NSTEMI during that admission with a peak troponin of 13.48. Continued aggressive medical management was recommended due to her CKD and she did not wish to pursue an aggressive approach with a cardiac catheterization at that time.  She was again hospitalized from 03/13/2015 - 03/28/2015 for enterococcus bacteremia. She had a TEE on 03/22/2015 which showed no vegetation or evidence of SBE. She was hospitalized again from 04/06/2015 - 04/14/2015 for acute on chronic diastolic CHF and coagulopathy with supra therapeutic INR likely secondary to Ampicillin.  (INR 4.54 on 04/14/2015, despite Coumadin being held multiple days).   She presents today reporting constant left-pectoral region chest pain and mid-back pain which started last night. She describes the pain as an "aching sensation" and says it is a 9/10. She is currently residing at a SNF and was given SL NTG overnight and this morning which relieved her pain slightly, but not all the way. Denies any associated nausea, vomiting, diaphoresis, or dyspnea. Her right leg demonstrates significant edema which her family says developed  the day after she was discharged from the hospital most recently.  Her family reports she was made a DNR last admission and signed all of the paperwork in regards to this. However, while I was at the bedside the patient was saying she would want compressions if needed and would want ACLS medications, but would not want to be intubated.   While in the ED, her INR is 3.00. Creatinine 3.49. WBC 14.8. Hgb 7.0. Platelets 174. i-STAT troponin 0.63. BNP 1333. CXR showing mild pulmonary interstitial edema consistent with CHF along with atelectasis or pneumonia in the left mid lung.   Past Medical History   Past Medical History  Diagnosis Date  . Kidney transplant as cause of abnormal reaction or later complication   . Hypertension   . Colon polyps   . Gall stones 1980  . Anti-phospholipid antibody syndrome [facility])     (Based on hospital discharge summary march 2013)  . Paroxysmal atrial fibrillation (HCC)   . Carpal tunnel syndrome 2003    lt  . Esophageal stricture   . Hemorrhoids   . Renal disorder   . ESRD (end stage renal disease) on dialysis [facility])     "til I got my transplant in 2010"  . DVT (deep venous thrombosis) (HCC) 1990    LLE  . History of blood transfusion     "related to the lupus"  . Dyslipidemia 07/22/2013  . Coronary artery disease   . Heart murmur     mild MR on echo  . Pneumonia     "3 times now" (08/25/2013)  . GERD (gastroesophageal reflux disease)   . Arthritis     "  fingers" (08/25/2013)  . Glaucoma, left eye   . Bilateral carotid artery stenosis     1-39%  . Pulmonary HTN (HCC)     PASP 46mmHg    Past Surgical History  Procedure Laterality Date  . Nephrectomy transplanted organ  05/2008  . Cholecystectomy  1980  . Hematoma evacuation  1998    "after they took peritoneal catheter out"  . Peritoneal catheter insertion    . Peritoneal catheter removal    . Carpal tunnel release Right 07/2001  . Arteriovenous graft placement    . Thrombectomy and revision of  arterioventous (av) goretex  graft Right 07/1999; 04/2002; 09/2002; 12/2005; 09/2007;     upper arm/notes 07/31/1999; 05/18/2002; 10/07/2002; 01/14/2006; 09/14/2007;   . Right heart catheterization N/A 08/28/2013    Procedure: RIGHT HEART CATH;  Surgeon: Sinclair Grooms, MD;  Location: [facility] CATH LAB;  Service: Cardiovascular;  Laterality: N/A;  . Tee without cardioversion N/A 03/22/2015    Procedure: TRANSESOPHAGEAL ECHOCARDIOGRAM (TEE);  Surgeon: Josue Hector, MD;  Location: [facility] ENDOSCOPY;  Service: Cardiovascular;  Laterality: N/A;     Allergies  Allergies  Allergen Reactions  . Feraheme [Ferumoxytol] Other (See Comments)    Chest pain, pulsating 02/17/15: tolerated Nulecit  . Vancomycin Anaphylaxis, Itching, Swelling and Other (See Comments)    Tongue swell  . Dorzolamide Hcl-Timolol Mal Rash and Other (See Comments)    Eye pain  . Gentamycin [Gentamicin Sulfate] Itching and Swelling  . Latanoprost Rash and Other (See Comments)    Eye pain  . Cefazolin Itching  . Codeine Itching  . Hydrocodone-Acetaminophen Itching  . Penicillins Itching    *tolerated ampicillin during 02/2015 admission Has patient had a PCN reaction causing immediate rash, facial/tongue/throat swelling, SOB or lightheadedness with hypotension:No Has patient had a PCN reaction causing severe rash involving mucus membranes or skin necrosis:No Has patient had a PCN reaction that required hospitalization:No Has patient had a PCN reaction occurring within the last 10 years:No If all of the above answers are "NO", then may proceed with Cephalosporin use.     Inpatient Medications    . fentaNYL (SUBLIMAZE) injection  50 mcg Intravenous Once    Family History    Family History  Problem Relation Age of Onset  . Hypertension Mother   . Heart disease Mother   . Colon cancer Neg Hx   . Rectal cancer Neg Hx   . Stomach cancer Neg Hx     Social History    Social History   Social History  . Marital Status: Widowed     Spouse Name: N/A  . Number of Children: 3  . Years of Education: N/A   Occupational History  . disabled    Social History Main Topics  . Smoking status: Former Smoker -- 0.00 packs/day for 30 years    Types: Cigarettes    Quit date: 04/16/1977  . Smokeless tobacco: Never Used  . Alcohol Use: No  . Drug Use: No  . Sexual Activity: Not Currently   Other Topics Concern  . Not on file   Social History Narrative     Review of Systems    General:  No chills, fever, night sweats or weight changes.  Cardiovascular:  No dyspnea on exertion, orthopnea, palpitations, paroxysmal nocturnal dyspnea. Positive for chest pain, back pain, and lower extremity edema. Dermatological: No rash, lesions/masses Respiratory: No cough, dyspnea Urologic: No hematuria, dysuria Abdominal:   No nausea, vomiting, diarrhea, bright red blood per rectum, melena, or  hematemesis Neurologic:  No visual changes, wkns, changes in mental status. All other systems reviewed and are otherwise negative except as noted above.  Physical Exam    Blood pressure 123/72, pulse 90, temperature 98.1 F (36.7 C), temperature source Oral, resp. rate 21, height 5\' 1"  (1.549 m), weight 160 lb (72.576 kg), SpO2 100 %.  General: Pleasant, elderly African American female, appearing older than her stated age. Continuously voicing she is in pain.  Psych: Normal affect. Neuro: Alert and oriented X 3. Moves all extremities spontaneously. HEENT: Normal  Neck: Supple without bruits or JVD. Lungs:  Resp regular and unlabored, minimal rales at bases bilaterally. Heart: RRR no s3, s4, or murmurs. Abdomen: Soft, non-tender, non-distended, BS + x 4.  Extremities: No clubbing or cyanosis. 2+ pitting edema on the right up to her knee. Trace edema on the left. DP/PT/Radials 2+ and equal bilaterally.  Labs    Troponin [facility] of Care Test)  Recent Labs  03/23/2015 0913  TROPIPOC 0.63*   No results for input(s): CKTOTAL, CKMB, TROPONINI  in the last 72 hours. Lab Results  Component Value Date   WBC 14.8* 04/16/2015   HGB 7.0* 04/08/2015   HCT 22.3* 03/24/2015   MCV 88.5 04/12/2015   PLT 174 04/09/2015    Recent Labs Lab 03/31/2015 0912  NA 139  K 4.2  CL 104  CO2 22  BUN 51*  CREATININE 3.49*  CALCIUM 9.4  GLUCOSE 110*   Lab Results  Component Value Date   CHOL 146 10/28/2014   HDL 52 10/28/2014   LDLCALC 78 10/28/2014   TRIG 81 10/28/2014   Lab Results  Component Value Date   DDIMER 0.40 08/24/2013     Radiology Studies    Dg Chest Portable 1 View: 04/01/2015  CLINICAL DATA:  Recurrent left-sided chest pain since 3:30 a.m. today, intermittent relief with nitroglycerin; history of atrial fibrillation, hypertension, and end-stage renal disease EXAM: PORTABLE CHEST 1 VIEW COMPARISON:  Chest x-ray of March 16th 2017 FINDINGS: The pulmonary interstitial markings remain increased but are slightly less conspicuous today. Confluent alveolar opacity in the left mid lung is stable. The heart is top-normal in size. The pulmonary vascularity is engorged but more distinct today. A PICC line tip projects over the midportion of the SVC. IMPRESSION: Mild pulmonary interstitial edema consistent with CHF. Atelectasis or pneumonia in the left mid lung. Slight overall improvement since the study of 11 days ago. Electronically Signed   By: David  Martinique M.D.   On: 04/15/2015 09:33    EKG & Cardiac Imaging    EKG: NSR, HR 79. LVH. No acute ST or T-wave changes since previous tracing.  Echocardiogram: 03/16/2015 Study Conclusions - Left ventricle: The cavity size was normal. Systolic function was  normal. The estimated ejection fraction was in the range of 55%  to 60%. Wall motion was normal; there were no regional wall  motion abnormalities. Features are consistent with a pseudonormal  left ventricular filling pattern, with concomitant abnormal  relaxation and increased filling pressure (grade 2 diastolic   dysfunction). Doppler parameters are consistent with high  ventricular filling pressure. - Aortic valve: Severe diffuse thickening and calcification. - Mitral valve: Moderately to severely calcified annulus. Mildly  thickened leaflets . The findings are consistent with mild  stenosis. There was mild regurgitation. Valve area by pressure  half-time: 2.06 cm^2. - Left atrium: The atrium was severely dilated. - Tricuspid valve: There was mild regurgitation. - Pulmonic valve: There was mild regurgitation. - Pulmonary  arteries: PA peak pressure: 64 mm Hg (S).  Impressions: - There was no obvious evidence of a vegetation. The right  ventricular systolic pressure was increased consistent with  moderate pulmonary hypertension.  Assessment & Plan    1. NSTEMI - reports a constant left-pectoral region chest pain and mid-back pain which started last night and relived with SL NTG. Denies any associated nausea, vomiting, diaphoresis, or dyspnea. -  i-STAT troponin elevated to 0.63. EKG shows no acute ischemic changes. Would cycle cardiac enzymes. - had a recent NSTEMI in 01/2015 with a peak troponin of 13.48. Continued aggressive medical management was recommended at that time due to her CKD and the patient not wishing to pursue an aggressive approach with a cardiac catheterization at that time. - with her current creatinine of 3.5, Hgb of 7.0, and INR of 3.0, she is not currently a cardiac catheterization candidate. - would continue medical management with ASA, statin, BB, and Imdur. Would not start Heparin at this time as she is not a candidate for invasive evaluation and is therapeutic with an INR of 3.0. - would recommend a Palliative Care consult.   2. Acute on Chronic Combined Systolic and Diastolic CHF - Echo in [id number] showed EF of 55-60%. Minimal rales at bases bilaterally with one-sided lower extremity edema on the right. BNP elevated to 1333.  - would benefit from a Nephrology  consult in regards to further diuresis and her creatinine where it is currently. Says her outpatient Nephrologist has mentioned the need for dialysis in the past but she is unsure if this is something she would pursue. - could try small dose of IV Lasix and see how her creatinine responds.   3. Acute on Chronic Stage 4 CKD with Renal Transplant - creatinine elevated to 3.49 at time of admission. Was 2.62 only 4 days ago. - followed by Nephrology as outpatient. - per admitting team  4. Anti-phospholipid antibody syndrome/ history of DVT's - on chronic Coumadin. Has been held recently due to supra therapeutic INR.  - INR currently at 3.00 on 04/11/2015. Would need pharmacy consult for Coumadin while admitted.  5. History of PAF - This patients CHA2DS2-VASc Score and unadjusted Ischemic Stroke Rate (% per year) is equal to 7.2 % stroke rate/year from a score of 5 (CHF, HTN, Vascular, Age, Female). Continue Coumadin. - would resume BB at time of admisson.  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 - TEE showed no evidence of vegetation on 03/22/2015.  7. Chronic Anemia - Hgb 7.0 on 04/19/2015. Was 10.6 six days ago. - denies any active evidence of bleeding at this time. - further workup per admitting team.  8. Code Status - made a DNR last admission and signed all of the paperwork in regards to this. However, while I was at the bedside the patient was saying she would want compressions if needed and would want ACLS medications, but would not want to be intubated.  - this needs to be further addressed.    Signed, Erma Heritage, PA-C 04/04/2015, 10:22 AM Pager: [phone]  I have personally seen and examined this patient with Bernerd Pho, PA-C. I agree with the assessment and plan as outlined above. Debbie [name] has many complex issues She presents with chest pain in the setting of profound anemia (Hgb 7), acute renal failure (creat 3.5), supra-therapeutic INR off  of coumadin for one week. Her troponin shows subtle elevation. EKG without ischemic changes. I agree that she should be admitted for further workup  but her cardiac issues are only a small part of her presentation. She is a poor candidate for invasive cardiac evaluation or anti-coagulation currently given the above findings. Given her chronic issues with acute worsening, should consider palliation. She has reversed her DNR and is now a full code per pt wishes. If she chooses to be aggressive with workup, will need GI workup, transfusion and likely Nephrology evaluation. We will follow along but I would not anticipate an invasive cardiac evaluation at this time.   Hlee Fringer 04/03/2015 12:32 PM

## 2015-04-19 ENCOUNTER — Ambulatory Visit: Payer: Self-pay | Admitting: Licensed Clinical Social Worker

## 2015-04-19 ENCOUNTER — Encounter (HOSPITAL_COMMUNITY): Payer: Self-pay | Admitting: Primary Care

## 2015-04-19 DIAGNOSIS — Z515 Encounter for palliative care: Secondary | ICD-10-CM | POA: Insufficient documentation

## 2015-04-19 LAB — URINALYSIS, ROUTINE W REFLEX MICROSCOPIC
BILIRUBIN URINE: NEGATIVE
Glucose, UA: NEGATIVE mg/dL
KETONES UR: NEGATIVE mg/dL
NITRITE: NEGATIVE
PH: 5 (ref 5.0–8.0)
Protein, ur: NEGATIVE mg/dL
Specific Gravity, Urine: 1.011 (ref 1.005–1.030)

## 2015-04-19 LAB — COMPREHENSIVE METABOLIC PANEL
ALBUMIN: 1.9 g/dL — AB (ref 3.5–5.0)
ALK PHOS: 63 U/L (ref 38–126)
ALT: 12 U/L — AB (ref 14–54)
AST: 33 U/L (ref 15–41)
Anion gap: 14 (ref 5–15)
BILIRUBIN TOTAL: 0.9 mg/dL (ref 0.3–1.2)
BUN: 53 mg/dL — AB (ref 6–20)
CALCIUM: 8.8 mg/dL — AB (ref 8.9–10.3)
CO2: 21 mmol/L — ABNORMAL LOW (ref 22–32)
Chloride: 99 mmol/L — ABNORMAL LOW (ref 101–111)
Creatinine, Ser: 3.66 mg/dL — ABNORMAL HIGH (ref 0.44–1.00)
GFR calc Af Amer: 14 mL/min — ABNORMAL LOW (ref >60)
GFR calc non Af Amer: 12 mL/min — ABNORMAL LOW (ref >60)
GLUCOSE: 269 mg/dL — AB (ref 65–99)
Potassium: 4.1 mmol/L (ref 3.5–5.1)
Sodium: 134 mmol/L — ABNORMAL LOW (ref 135–145)
TOTAL PROTEIN: 4.7 g/dL — AB (ref 6.5–8.1)

## 2015-04-19 LAB — CBC
HEMATOCRIT: 24.1 % — AB (ref 36.0–46.0)
HEMOGLOBIN: 7.6 g/dL — AB (ref 12.0–15.0)
MCH: 28.4 pg (ref 26.0–34.0)
MCHC: 31.5 g/dL (ref 30.0–36.0)
MCV: 89.9 fL (ref 78.0–100.0)
Platelets: 182 10*3/uL (ref 150–400)
RBC: 2.68 MIL/uL — ABNORMAL LOW (ref 3.87–5.11)
RDW: 16.7 % — ABNORMAL HIGH (ref 11.5–15.5)
WBC: 11.5 10*3/uL — AB (ref 4.0–10.5)

## 2015-04-19 LAB — APTT: aPTT: 60 seconds — ABNORMAL HIGH (ref 24–37)

## 2015-04-19 LAB — URINE MICROSCOPIC-ADD ON

## 2015-04-19 LAB — IRON AND TIBC
Iron: 31 ug/dL (ref 28–170)
SATURATION RATIOS: 23 % (ref 10.4–31.8)
TIBC: 134 ug/dL — ABNORMAL LOW (ref 250–450)
UIBC: 103 ug/dL

## 2015-04-19 LAB — PROTIME-INR
INR: 3.09 — ABNORMAL HIGH (ref 0.00–1.49)
Prothrombin Time: 31.3 seconds — ABNORMAL HIGH (ref 11.6–15.2)

## 2015-04-19 LAB — BRAIN NATRIURETIC PEPTIDE: B NATRIURETIC PEPTIDE 5: 2795.1 pg/mL — AB (ref 0.0–100.0)

## 2015-04-19 LAB — PHOSPHORUS: Phosphorus: 4.6 mg/dL (ref 2.5–4.6)

## 2015-04-19 MED ORDER — MORPHINE SULFATE (PF) 2 MG/ML IV SOLN
1.0000 mg | INTRAVENOUS | Status: DC | PRN
  Administered 2015-04-19 (×3): 1 mg via INTRAVENOUS
  Filled 2015-04-19 (×4): qty 1

## 2015-04-19 MED ORDER — CALCITRIOL 0.5 MCG PO CAPS
0.5000 ug | ORAL_CAPSULE | ORAL | Status: DC
  Administered 2015-04-20 – 2015-04-28 (×4): 0.5 ug via ORAL
  Filled 2015-04-19 (×5): qty 1
  Filled 2015-04-19: qty 2

## 2015-04-19 MED ORDER — SODIUM CHLORIDE 0.9 % IV SOLN: INTRAVENOUS | Status: DC

## 2015-04-19 MED ORDER — METOLAZONE 10 MG PO TABS
10.0000 mg | ORAL_TABLET | Freq: Every day | ORAL | Status: DC
  Administered 2015-04-20 – 2015-04-22 (×3): 10 mg via ORAL
  Filled 2015-04-19 (×4): qty 1
  Filled 2015-04-19: qty 2
  Filled 2015-04-19 (×5): qty 1

## 2015-04-19 MED ORDER — DARBEPOETIN ALFA 200 MCG/0.4ML IJ SOSY
200.0000 ug | PREFILLED_SYRINGE | INTRAMUSCULAR | Status: AC
  Administered 2015-04-19: 200 ug via SUBCUTANEOUS
  Filled 2015-04-19: qty 0.4

## 2015-04-19 MED ORDER — LORAZEPAM 2 MG/ML IJ SOLN
INTRAMUSCULAR | Status: AC
  Filled 2015-04-19: qty 1

## 2015-04-19 MED ORDER — PANTOPRAZOLE SODIUM 40 MG IV SOLR
40.0000 mg | Freq: Two times a day (BID) | INTRAVENOUS | Status: DC
  Administered 2015-04-19 – 2015-04-21 (×4): 40 mg via INTRAVENOUS
  Filled 2015-04-19 (×6): qty 40

## 2015-04-19 MED ORDER — LORAZEPAM 2 MG/ML IJ SOLN
0.5000 mg | Freq: Once | INTRAMUSCULAR | Status: AC
  Administered 2015-04-19: 0.5 mg via INTRAVENOUS

## 2015-04-19 MED ORDER — DARBEPOETIN ALFA 200 MCG/0.4ML IJ SOSY
200.0000 ug | PREFILLED_SYRINGE | INTRAMUSCULAR | Status: DC
  Administered 2015-04-26: 200 ug via SUBCUTANEOUS
  Filled 2015-04-19 (×2): qty 0.4

## 2015-04-19 NOTE — Consult Note (Signed)
Consultation Note Date: 04/19/2015   Patient Name: Debbie [name]  DOB: May 27, 1950  MRN: [id number]  Age / Sex: 65 y.o., female  PCP: No Pcp Per Patient Referring Physician: Domenic Polite, MD  Reason for Consultation: Establishing goals of care, Non pain symptom management and Psychosocial/spiritual support    Clinical Assessment/Narrative: Debbie [name] is a 65 year old female patient with a history of kidney failure and subsequent kidney transplant in 2010, hypertension, DVT, coronary artery disease, pneumonia, Debbie [name], and NSTMI in January 2017.  She presents to the ED today with complaints of chest pain, similar to the chest pain she exhibited on her previous heart attack. She is noted have a low hemoglobin. She had packed red blood cells and subsequent fluid overload. She is being followed by her "trusted" nephrologist Dr. Esau [name].  Debbie [name] is resting quietly on the stretcher in the ED with her niece Debbie [name] and friend Debbie [name] at her bedside.  We talk about healthcare power of attorney. Debbie [name] names her daughter Debbie [name] as Debbie [name], but states they have no paperwork.  I offer for Chaplain to visit to complete this paperwork, otherwise her children must make choices together.  Debbie [name] decides that she does not want to name any one child, but have her children make decisions together.  We discuss the importance of conversations with her children so the know what she DOES and DOES NOT want.  We also talk about code status. Family at bedside states that she would not want to be intubated, on life support, and Debbie [name] agrees.  She states that she is okay with the use of BiPAP, medications, and a pacer defibrillator if needed.  Lifelong friend, Debbie [name], talks about quality of life over quantity.  Debbie [name] shares her faith and trust in the [place].  Debbie [name] shares that she is currently at [facility] facility,  with IV antibiotics via PIC line .  She was noted to have bacteremia on a recent mission.  She tells me that her goal is to return to her own home with the support of her family, but that at this time she is unable to walk, is wheel chair bound, and is unable to stand and pivot.  Somewhat limited discussion today due to patient condition, and adult children not present.  Contacts/Participants in Discussion:  Debbie [name], Niece Debbie [name], Friend Debbie "Debbie [name]".  Primary Decision Maker: Debbie [name] is able to make her own healthcare choices at this time.    Relationship to Patient self HCPOA: no  Debbie [name] names her daughter Debbie [name] as Debbie [name], but states they have no paperwork.  I offer for Chaplain to visit to complete this paperwork, otherwise her children must make choices together.  Debbie [name] decides that she does not want to name any one child, but have her children make decisions together.  We discuss the importance of conversations with her children so the know what she DOES and DOES NOT want.  Children are Debbie [name], Debbie [name].   SUMMARY OF RECOMMENDATIONS  Code Status/Advance Care Planning: DNR    Code Status Orders        Start     Ordered   04/19/15 1156  Do not attempt resuscitation (DNR)   Continuous    Question Answer Comment  In the event of cardiac or respiratory ARREST Do not call a "code blue"   In the event of cardiac or respiratory ARREST Do not perform Intubation, CPR, defibrillation or ACLS   In  the event of cardiac or respiratory ARREST Use medication by any route, position, wound care, and other measures to relive pain and suffering. May use oxygen, suction and manual treatment of airway obstruction as needed for comfort.      04/19/15 1155    Code Status History    Date Active Date Inactive Code Status Order ID Comments User Context   04/10/2015  3:01 PM 04/19/2015 11:55 AM Partial Code [id number]  Waldemar Dickens, MD ED   04/06/2015  9:37 PM 04/14/2015   5:35 PM DNR [id number]  Dionne Milo, NP Inpatient   04/06/2015  3:05 PM 04/06/2015  9:37 PM DNR [id number]  Dionne Milo, NP ED   03/14/2015  3:26 AM 03/28/2015  9:46 PM Full Code [id number]  Rise Patience, MD Inpatient   02/15/2015  2:11 AM 02/21/2015  7:26 PM Full Code [id number]  Toy Baker, MD Inpatient   08/28/2013  3:01 PM 09/01/2013  2:27 PM Full Code [id number]  Belva Crome, MD Inpatient   08/27/2013  5:20 AM 08/28/2013  3:01 PM Full Code [id number]  Grafton Folk, MD Inpatient   08/24/2013  2:00 PM 08/26/2013  4:06 PM Full Code [id number]  Eileen Stanford, PA-C Inpatient   06/30/2013  6:42 PM 07/06/2013  7:30 PM Full Code [id number]  Thurnell Lose, MD Inpatient   04/17/2011  8:57 PM 04/20/2011  6:30 PM Full Code [id number]  Robbie Lis, MD ED      Other Directives:None  Symptom Management:   Per hospitalist  Palliative Prophylaxis:   Frequent Pain Assessment and Turn Reposition  Additional Recommendations (Limitations, Scope, Preferences):  Treat the treatable at this time.   Debbie [name] states she will take HD if needed.  She has done this in the past, both HD and PD.   Psycho-social/Spiritual:  Support System: Strong Desire for further Chaplaincy support:Not discussed today.  Additional Recommendations: Caregiving  Support/Resources and Education on Hospice  Prognosis: Unable to determine, based on outcomes.  She has a recent history of bacteriemia.   Discharge Planning: Based on outcomes, she is currently in SNF, but goal is to return home if at all possible.    Chief Complaint/ Primary Diagnoses: Present on Admission:  . CKD (chronic kidney disease), stage IV ([place]) . HTN (hypertension) . Enterococcal bacteremia . Supratherapeutic INR . Acute renal failure superimposed on stage 4 chronic kidney disease ([place])  I have reviewed the medical record, interviewed the patient and family, and examined the patient. The following aspects are pertinent.  Past Medical  History  Diagnosis Date  . Kidney transplant as cause of abnormal reaction or later complication   . Hypertension   . Colon polyps   . Gall stones 1980  . Anti-phospholipid antibody syndrome [facility])     (Based on hospital discharge summary march 2013)  . Paroxysmal atrial fibrillation (HCC)   . Carpal tunnel syndrome 2003    lt  . Esophageal stricture   . Hemorrhoids   . Renal disorder   . ESRD (end stage renal disease) on dialysis [facility])     "til I got my transplant in 2010"  . DVT (deep venous thrombosis) (HCC) 1990    LLE  . History of blood transfusion     "related to the lupus"  . Dyslipidemia 07/22/2013  . Coronary artery disease   . Heart murmur     mild MR on echo  . Pneumonia     "3 times now" (08/25/2013)  .  GERD (gastroesophageal reflux disease)   . Arthritis     "fingers" (08/25/2013)  . Glaucoma, left eye   . Bilateral carotid artery stenosis     1-39%  . Pulmonary HTN (HCC)     PASP 23mmHg   Social History   Social History  . Marital Status: Widowed    Spouse Name: N/A  . Number of Children: 3  . Years of Education: N/A   Occupational History  . disabled    Social History Main Topics  . Smoking status: Former Smoker -- 0.00 packs/day for 30 years    Types: Cigarettes    Quit date: 04/16/1977  . Smokeless tobacco: Never Used  . Alcohol Use: No  . Drug Use: No  . Sexual Activity: Not Currently   Other Topics Concern  . None   Social History Narrative   Family History  Problem Relation Age of Onset  . Hypertension Mother   . Heart disease Mother   . Colon cancer Neg Hx   . Rectal cancer Neg Hx   . Stomach cancer Neg Hx    Scheduled Meds: . sodium chloride   Intravenous Once  . allopurinol  200 mg Oral Daily  . ampicillin IVPB 2 gram/NS 50 mL  2 g Intravenous Q8H  . atorvastatin  40 mg Oral q1800  . calcitRIOL  0.25 mcg Oral QODAY  . [START ON 04/20/2015] calcitRIOL  0.5 mcg Oral QODAY  . carvedilol  6.25 mg Oral BID WC  . [START ON  04/26/2015] darbepoetin (ARANESP) injection - NON-DIALYSIS  200 mcg Subcutaneous Q Tue-1800  . ferric gluconate (FERRLECIT/NULECIT) IV  125 mg Intravenous Daily  . furosemide  160 mg Intravenous Q6H  . hydrocortisone sod succinate (SOLU-CORTEF) inj  50 mg Intravenous Q8H  . latanoprost  1 drop Both Eyes QHS  . magnesium oxide  400 mg Oral BID  . metolazone  10 mg Oral Daily  . mycophenolate  180 mg Oral BID  . pantoprazole (PROTONIX) IV  40 mg Intravenous Q12H  . ranolazine  500 mg Oral BID  . sodium chloride flush  3 mL Intravenous Q12H  . tacrolimus  2 mg Oral BID  . timolol  1 drop Both Eyes BID   Continuous Infusions: . nitroGLYCERIN     PRN Meds:.acetaminophen **OR** acetaminophen, fentaNYL (SUBLIMAZE) injection, levalbuterol, morphine injection, nitroGLYCERIN, ondansetron **OR** ondansetron (ZOFRAN) IV, oxyCODONE-acetaminophen Medications Prior to Admission:  Prior to Admission medications   Medication Sig Start Date End Date Taking? Authorizing Provider  acetaminophen (TYLENOL) 325 MG tablet Take 2 tablets (650 mg total) by mouth every 6 (six) hours as needed for mild pain (or Fever >/= 101). 02/21/15  Yes Modena Jansky, MD  allopurinol (ZYLOPRIM) 100 MG tablet Take 1 tablet (100 mg total) by mouth daily. 04/14/15  Yes Shanker Kristeen Mans, MD  ampicillin 2 g in sodium chloride 0.9 % 50 mL Inject 2 g into the vein every 8 (eight) hours. End Date 04/26/15 04/14/15  Yes Shanker Kristeen Mans, MD  atorvastatin (LIPITOR) 40 MG tablet Take 1 tablet (40 mg total) by mouth daily at 6 PM. 02/21/15  Yes Modena Jansky, MD  bimatoprost (LUMIGAN) 0.01 % SOLN Place 1 drop into both eyes at bedtime.  05/21/13  Yes Historical Provider, MD  Brinzolamide-Brimonidine (SIMBRINZA) 1-0.2 % SUSP Place 1 drop into the left eye 3 (three) times daily. 09/14/14  Yes Historical Provider, MD  calcitRIOL (ROCALTROL) 0.25 MCG capsule Take 0.25-0.5 mcg by mouth every other day. alternating  dosages   Yes Historical Provider,  MD  carvedilol (COREG) 6.25 MG tablet Take 1 tablet (6.25 mg total) by mouth 2 (two) times daily with a meal. 02/21/15  Yes Modena Jansky, MD  furosemide (LASIX) 40 MG tablet Take 2 tablets (80 mg total) by mouth daily. 04/14/15  Yes Shanker Kristeen Mans, MD  isosorbide mononitrate (IMDUR) 60 MG 24 hr tablet Take 1 tablet (60 mg total) by mouth daily. 02/21/15  Yes Modena Jansky, MD  MAGNESIUM-OXIDE 400 (241.3 MG) MG tablet TAKE 1 TABLET BY MOUTH TWICE DAILY 10/26/14  Yes Sueanne Margarita, MD  mycophenolate (MYFORTIC) 180 MG EC tablet Take 180 mg by mouth 2 (two) times daily.    Yes Historical Provider, MD  nitroGLYCERIN (NITROSTAT) 0.4 MG SL tablet Place 0.4 mg under the tongue every 5 (five) minutes as needed for chest pain.  08/26/13  Yes Historical Provider, MD  oxyCODONE-acetaminophen (PERCOCET/ROXICET) 5-325 MG tablet Take 0.5-1 tablets by mouth every 6 (six) hours as needed for moderate pain or severe pain. 04/14/15  Yes Shanker Kristeen Mans, MD  potassium chloride (K-DUR) 10 MEQ tablet Take 1 tablet (10 mEq total) by mouth daily. 04/14/15  Yes Shanker Kristeen Mans, MD  predniSONE (DELTASONE) 5 MG tablet Take 5 mg by mouth daily with breakfast.  04/24/12  Yes Historical Provider, MD  RANEXA 500 MG 12 hr tablet TAKE 1 TABLET BY MOUTH TWICE DAILY 01/11/15  Yes Sueanne Margarita, MD  ranitidine (ZANTAC) 150 MG tablet Take 150 mg by mouth daily.    Yes Historical Provider, MD  sodium bicarbonate 650 MG tablet Take 1,300 mg by mouth 2 (two) times daily.  07/13/11  Yes Historical Provider, MD  tacrolimus (PROGRAF) 1 MG capsule Take 2 mg by mouth 2 (two) times daily.    Yes Historical Provider, MD  timolol (TIMOPTIC) 0.5 % ophthalmic solution Place 1 drop into both eyes 2 (two) times daily. Reported on 03/13/2015 05/21/13  Yes Historical Provider, MD  warfarin (COUMADIN) 1 MG tablet Monday and Friday 0.5 mg and 1 mg on all other days-CURRENTLY ON HOLD AS INR STILL 4.54 AS OF 04/14/15-WOULD RESUME AT ABOVE DOSAGE WHEN INR  IS CLOSE TO 2.5. NOTE ON AMPICILLIN TILL 4/4-WHICH LIKELY IS CAUSING VERY LABILE AND ELEVATED INR. 04/14/15  Yes Shanker Kristeen Mans, MD   Allergies  Allergen Reactions  . Feraheme [Ferumoxytol] Other (See Comments)    Chest pain, pulsating 02/17/15: tolerated Nulecit  . Vancomycin Anaphylaxis, Itching, Swelling and Other (See Comments)    Tongue swell  . Dorzolamide Hcl-Timolol Mal Rash and Other (See Comments)    Eye pain  . Gentamycin [Gentamicin Sulfate] Itching and Swelling  . Latanoprost Rash and Other (See Comments)    Eye pain  . Cefazolin Itching  . Codeine Itching  . Hydrocodone-Acetaminophen Itching  . Penicillins Itching    *tolerated ampicillin during 02/2015 admission Has patient had a PCN reaction causing immediate rash, facial/tongue/throat swelling, SOB or lightheadedness with hypotension:No Has patient had a PCN reaction causing severe rash involving mucus membranes or skin necrosis:No Has patient had a PCN reaction that required hospitalization:No Has patient had a PCN reaction occurring within the last 10 years:No If all of the above answers are "NO", then may proceed with Cephalosporin use.     Review of Systems  Unable to perform ROS: Acuity of condition    Physical Exam  Nursing note and vitals reviewed. Constitutional: She is oriented to person, place, and time.  Mild distress  HENT:  Head: Normocephalic and atraumatic.  Cardiovascular: Normal rate and regular rhythm.   Respiratory: Effort normal. No respiratory distress.  Some work of breathing noted.  Rate low to mid 20's, reduced after fentanyl IV.   GI: Soft. She exhibits no distension. There is no guarding.  Neurological: She is alert and oriented to person, place, and time.  Skin: Skin is warm and dry.    Vital Signs: BP 124/84 mmHg  Pulse 109  Temp(Src) 97.9 F (36.6 C) (Axillary)  Resp 25  Ht 5\' 1"  (1.549 m)  Wt 72.576 kg (160 lb)  BMI 30.25 kg/m2  SpO2 94%  LMP  (LMP Unknown)  SpO2:  SpO2: 94 % O2 Device:SpO2: 94 % O2 Flow Rate: .O2 Flow Rate (L/min): 3 L/min  IO: Intake/output summary:  Intake/Output Summary (Last 24 hours) at 04/19/15 1602 Last data filed at 04/19/15 [id number]  Gross per 24 hour  Intake      0 ml  Output    750 ml  Net   -750 ml    LBM:   Baseline Weight: Weight: 72.576 kg (160 lb) Most recent weight: Weight: 72.576 kg (160 lb)      Palliative Assessment/Data:  Flowsheet Rows        Most Recent Value   Intake Tab    Referral Department  Hospitalist   Unit at Time of Referral  ER   Palliative Care Primary Diagnosis  Cardiac   Date Notified  04/22/2015   Palliative Care Type  New Palliative care   Reason for referral  Clarify Goals of Care   Date of Admission  04/02/2015   # of days IP prior to Palliative referral  0   Clinical Assessment    Palliative Performance Scale Score  30%   Pain Max last 24 hours  Not able to report   Pain Min Last 24 hours  Not able to report   Dyspnea Max Last 24 Hours  Not able to report   Dyspnea Min Last 24 hours  Not able to report   Psychosocial & Spiritual Assessment    Palliative Care Outcomes    Patient/Family meeting held?  Yes   Who was at the meeting?  patinet, niece Debbie [name], friend Debbie "Debbie [name]"   Palliative Care Outcomes  Clarified goals of care, Provided psychosocial or spiritual support, Provided advance care planning   Palliative Care follow-up planned  No      Additional Data Reviewed:  CBC:    Component Value Date/Time   WBC 11.5* 04/19/2015 0359   HGB 7.6* 04/19/2015 0359   HCT 24.1* 04/19/2015 0359   PLT 182 04/19/2015 0359   MCV 89.9 04/19/2015 0359   NEUTROABS 10.2* 04/19/2015 0912   LYMPHSABS 3.0 03/29/2015 0912   MONOABS 1.0 04/20/2015 0912   EOSABS 0.6 04/04/2015 0912   BASOSABS 0.0 03/29/2015 0912   Comprehensive Metabolic Panel:    Component Value Date/Time   NA 134* 04/19/2015 0359   K 4.1 04/19/2015 0359   CL 99* 04/19/2015 0359   CO2 21* 04/19/2015 0359    BUN 53* 04/19/2015 0359   CREATININE 3.66* 04/19/2015 0359   GLUCOSE 269* 04/19/2015 0359   CALCIUM 8.8* 04/19/2015 0359   AST 33 04/19/2015 0359   ALT 12* 04/19/2015 0359   ALKPHOS 63 04/19/2015 0359   BILITOT 0.9 04/19/2015 0359   PROT 4.7* 04/19/2015 0359   ALBUMIN 1.9* 04/19/2015 0359     Time In: 0900 Time Out: [id number] Time Total: 50 minutes Greater  than 50%  of this time was spent counseling and coordinating care related to the above assessment and plan.  Signed by: Drue Novel, NP  Drue Novel, NP  04/19/2015, 4:02 PM  Please contact Palliative Medicine Team phone at [phone] for questions and concerns.

## 2015-04-19 NOTE — Progress Notes (Signed)
RT placed pt back on bi-pap due to increased WOB. RT will continue to monitor.

## 2015-04-19 NOTE — Care Management Note (Signed)
Case Management Note  Patient Details  Name: Debbie [name] MRN: [id number] Date of Birth: 1950/07/22  Subjective/Objective:     Adm w nstemi               Action/Plan: from nsg facility, sw ref placed   Expected Discharge Date:                  Expected Discharge Plan:  Debbie [name]  In-House Referral:  Clinical Social Work  Discharge planning Services     Post Acute Care Choice:    Choice offered to:     DME Arranged:    DME Agency:     HH Arranged:    [place] Agency:     Status of Service:     Medicare Important Message Given:    Date Medicare IM Given:    Medicare IM give by:    Date Additional Medicare IM Given:    Additional Medicare Important Message give by:     If discussed at [place] of Stay Meetings, dates discussed:    Additional Comments: ur review done  Lacretia Leigh, RN 04/19/2015, 2:09 PM

## 2015-04-19 NOTE — ED Notes (Signed)
PA called, this RN gave an update on the pt's status after weaned from BiPap.

## 2015-04-19 NOTE — Progress Notes (Signed)
SUBJECTIVE: She is wearing bipap. Breathing slightly better. She has mild chest pain.   BP 121/73 mmHg  Pulse 106  Temp(Src) 97.7 F (36.5 C) (Oral)  Resp 22  Ht 5\' 1"  (1.549 m)  Wt 160 lb (72.576 kg)  BMI 30.25 kg/m2  SpO2 98%  LMP  (LMP Unknown)  Intake/Output Summary (Last 24 hours) at 04/19/15 1143 Last data filed at 04/19/15 [id number]  Gross per 24 hour  Intake      0 ml  Output    750 ml  Net   -750 ml    PHYSICAL EXAM General: Well developed, well nourished. Wearing bipap. Alert and oriented x 3.  Neck: + JVD. No masses noted.  Lungs: Basilar crackles Heart: RRR with systolic murmur.  Abdomen: Bowel sounds are present. Soft, non-tender.  Extremities: Right lower extremity edema.   LABS: Basic Metabolic Panel:  Recent Labs  04/07/2015 0912 04/01/2015 1751 04/19/15 0359  NA 139  --  134*  K 4.2  --  4.1  CL 104  --  99*  CO2 22  --  21*  GLUCOSE 110*  --  269*  BUN 51*  --  53*  CREATININE 3.49*  --  3.66*  CALCIUM 9.4  --  8.8*  MG  --  1.9  --   PHOS  --  4.1 4.6   CBC:  Recent Labs  03/30/2015 0912 04/16/2015 1751 04/19/15 0359  WBC 14.8* 11.5* 11.5*  NEUTROABS 10.2*  --   --   HGB 7.0* 7.7* 7.6*  HCT 22.3* 24.1* 24.1*  MCV 88.5 89.6 89.9  PLT 174 156 182   Cardiac Enzymes:  Recent Labs  03/28/2015 1751  TROPONINI 0.95*   Current Meds: . allopurinol  200 mg Oral Daily  . atorvastatin  40 mg Oral q1800  . calcitRIOL  0.25 mcg Oral QODAY  . [START ON 04/20/2015] calcitRIOL  0.5 mcg Oral QODAY  . carvedilol  6.25 mg Oral BID WC  . [START ON 04/26/2015] darbepoetin (ARANESP) injection - NON-DIALYSIS  200 mcg Subcutaneous Q Tue-1800  . ferric gluconate (FERRLECIT/NULECIT) IV  125 mg Intravenous Daily  . hydrocortisone sod succinate (SOLU-CORTEF) inj  50 mg Intravenous Q8H  . latanoprost  1 drop Both Eyes QHS  . magnesium oxide  400 mg Oral BID  . mycophenolate  180 mg Oral BID  . ranolazine  500 mg Oral BID  . sodium bicarbonate  1,300 mg  Oral BID  . sodium chloride flush  3 mL Intravenous Q12H  . sodium chloride flush  3 mL Intravenous Q12H  . tacrolimus  2 mg Oral BID  . timolol  1 drop Both Eyes BID     ASSESSMENT AND PLAN:  1. NSTEMI: Pt admitted with chest pain and dyspnea with volume overload. She has mildly elevated troponin. Not felt to be a cath candidate due to supra-therapeutic INR, anemia and chronic kidney disease. EKG without ischemic changes. I would favor to continue medical management with ASA, statin, Beta blocker and and Imdur. If she is started on dialysis and her anemia is corrected, would consider cath.    2. Respiratory Distress/Acute on Chronic Diastolic CHF: Echo in [id number] showed EF of 55-60%. Chest x-ray with diffuse infiltrates, likely pulmonary edema. High dose IV Lasix per Nephrology.   3. 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 Creatinine is worsening.   4. Anti-phospholipid antibody syndrome/ history of DVT's: She is on chronic Coumadin. Has been  held recently due to supra therapeutic INR. INR is still over 3.   5. History of PAF: Continue beta blocker. This patients CHA2DS2-VASc Score and unadjusted Ischemic Stroke Rate (% per year) is equal to 7.2 % stroke rate/year from a score of 5 (CHF, HTN, Vascular, Age, Female). Will need coumadin restarted before discharge.   6. Enterococcus Baceteria: TEE showed no evidence of vegetation on 03/22/2015.  7. Chronic Anemia: Hgb 7.0 on 03/29/2015. She denies any active evidence of bleeding at this time. Further workup per admitting team.  8. Code Status: DNR  MCALHANY,CHRISTOPHER  3/28/201711:43 AM

## 2015-04-19 NOTE — ED Notes (Signed)
Pt and family agreed that they would like to wait on Foley insertion until she goes upstairs unless called for by urinary urgency

## 2015-04-19 NOTE — Progress Notes (Addendum)
BP low 70's (a drop from the 100-110's), HR 108 sats 96% on 50% BiPAP, resp 18-26. Pt awake and oriented x4. No c/o. Last BP at this time 89/73.   At 1632 BP 100/64, pt mentating well, will continue to monitor and notify MD if drops into the 70-80's again.

## 2015-04-19 NOTE — Progress Notes (Signed)
Wet lung sounds noted. Review of Dr. Deterding's notes reveals potential for HD. Pt in agreement to participate in HD. On-call nephrologist consulted and will pass on to day shift MD.

## 2015-04-19 NOTE — ED Notes (Signed)
Notified Dr. Broadus John of pt's continuous chest pain, now a 6/10 1.5 hrs after NTG tabs given. Morphine now ordered.

## 2015-04-19 NOTE — ED Notes (Signed)
Called RT, pt working harder to breathe. RT will assess and possibly place pt back on BiPap.

## 2015-04-19 NOTE — ED Notes (Signed)
Bladder scanned with 350 mL. Put patient on bed pain to void.  Post void scan showed 0 mL

## 2015-04-19 NOTE — ED Notes (Signed)
Called and spoke with Dr. Broadus John, let her know pt had to be placed on BiPap again. Dr. Aletha Halim round now, and wants to order ABG's.

## 2015-04-19 NOTE — Progress Notes (Signed)
RT NOTE:  Pt removed from BIPAP per [place], [place]. Pt on 3L [place] (Sats 100%). Pt understands to let daughter know to call if WOB becomes difficult. WOB is better @ this time. No SOB. RN at bedside. RT will monitor.

## 2015-04-19 NOTE — Progress Notes (Signed)
RT attempted another ABG without success using a doppler. RT suggested to MD that a venous gas be obtained. RT to continue to monitor.

## 2015-04-19 NOTE — Progress Notes (Signed)
RT NOTE:  Pt continues to do well off BIPAP. 3L [place] (Sats 98%). RN aware to monitor RR (currently 24). Pt states it is easy to take a breath. Pt understands to call out if she feels WOB is more difficult or if she becomes SOB.

## 2015-04-19 NOTE — Progress Notes (Signed)
Patient transported to [place] on BiPAP without complications; settings in flowsheet. RN at bedside with patient. RT will continue to monitor patient.

## 2015-04-19 NOTE — Progress Notes (Signed)
Subjective: Interval History: has complaints SOB but better.  Objective: Vital signs in last 24 hours: Temp:  [97.4 F (36.3 C)-97.8 F (36.6 C)] 97.7 F (36.5 C) (03/27 1616) Pulse Rate:  [88-121] 107 (03/28 1215) Resp:  [9-29] 26 (03/28 1215) BP: (68-204)/(40-171) 121/68 mmHg (03/28 1215) SpO2:  [92 %-100 %] 96 % (03/28 1215) FiO2 (%):  [28 %-80 %] 40 % (03/28 1033) Weight change:   Intake/Output from previous day: 03/27 0701 - 03/28 0700 In: -  Out: 575 [Urine:575] Intake/Output this shift: Total I/O In: -  Out: 175 [Urine:175]  General appearance: cooperative, mildly obese, slowed mentation and dyspnea Resp: rales bilaterally and rhonchi bilaterally Cardio: S1, S2 normal and systolic murmur: holosystolic 2/6, blowing at apex GI: obese, TX RLQ Extremities: edema 3+ and R arm swollen  Lab Results:  Recent Labs  04/15/2015 1751 04/19/15 0359  WBC 11.5* 11.5*  HGB 7.7* 7.6*  HCT 24.1* 24.1*  PLT 156 182   BMET:  Recent Labs  04/15/2015 0912 04/19/15 0359  NA 139 134*  K 4.2 4.1  CL 104 99*  CO2 22 21*  GLUCOSE 110* 269*  BUN 51* 53*  CREATININE 3.49* 3.66*  CALCIUM 9.4 8.8*   No results for input(s): PTH in the last 72 hours. Iron Studies:  Recent Labs  04/19/15 0359  IRON 31  TIBC 134*    Studies/Results: Dg Chest Portable 1 View  04/14/2015  CLINICAL DATA:  Shortness of breath EXAM: PORTABLE CHEST 1 VIEW COMPARISON:  Chest radiograph from earlier today. FINDINGS: Left PICC terminates in the upper third of the superior vena cava. Stable cardiomediastinal silhouette with top-normal heart size. No pneumothorax. Stable trace bilateral pleural effusions. There is significant worsening of severe fluffy parahilar opacities throughout both lungs. IMPRESSION: 1. Significant worsening of severe fluffy parahilar opacities throughout both lungs, favor worsening severe pulmonary edema / ARDS. 2. Stable trace bilateral pleural effusions. Electronically Signed   By:  Ilona Sorrel M.D.   On: 03/31/2015 16:47   Dg Chest Portable 1 View  04/20/2015  CLINICAL DATA:  Recurrent left-sided chest pain since 3:30 a.m. today, intermittent relief with nitroglycerin; history of atrial fibrillation, hypertension, and end-stage renal disease EXAM: PORTABLE CHEST 1 VIEW COMPARISON:  Chest x-ray of March 16th 2017 FINDINGS: The pulmonary interstitial markings remain increased but are slightly less conspicuous today. Confluent alveolar opacity in the left mid lung is stable. The heart is top-normal in size. The pulmonary vascularity is engorged but more distinct today. A PICC line tip projects over the midportion of the SVC. IMPRESSION: Mild pulmonary interstitial edema consistent with CHF. Atelectasis or pneumonia in the left mid lung. Slight overall improvement since the study of 11 days ago. Electronically Signed   By: David  Martinique M.D.   On: 04/11/2015 09:33    I have reviewed the patient's current medications.  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 2 REnal tx cont immunosuppress, check Prograf 3 Anemia limited by vol xs 4 HPHT 5 Gout 6 CAD per cards 7 DJD P qid Lasix, metol, follow chem urine, Prograf level    LOS: 1 day   Nikia Levels L 04/19/2015,12:26 PM

## 2015-04-19 NOTE — ED Notes (Signed)
Called and requested all late and morning meds from Pharmacy

## 2015-04-19 NOTE — ED Notes (Signed)
Dr. Joseph at bedside 

## 2015-04-19 NOTE — Progress Notes (Signed)
Pt desat into 80's. HR high 120's/ low 130's. SBP 160's. Pt given morphine and ordered ativan to help with anxiety. Pt on Bipap 50%. Lasix given IVPB for coarse crackles. Dialysis pt - refusal of HD to correct fluid overload. Respiratory therapy contacted.

## 2015-04-19 NOTE — ED Notes (Signed)
Spoke with Lyndel Pleasure, [place] regarding plan of care for pt.  Advised pt has input of 58ml IV/hr.  254ml output is the color of rust, pt is alert.  Advised pt has been on bipap almost 12 hours, no crackles. Gave okay for respiratory to attempt to ween if tolerated.  Pt refuses foley cath still at this time.  Advised no PO meds have been given due to bipap.

## 2015-04-19 NOTE — Progress Notes (Addendum)
[facility] Progress Note                                           Patient Demographics:    Debbie [name], is a 65 y.o. female, DOB - 02/11/1950, [id number]  Admit date - 03/23/2015   Admitting Physician Waldemar Dickens, MD  Outpatient Primary MD for the patient is No PCP Per Patient   Subjective:   Continues to be short of breath   Assessment  & Plan :   Acute Hypoxic resp failure -severe Pulm edema -continue BIPAP -on high dose IV lasix, with modest response, metolazone -Pt agreeable for HD at this time -Renal consulting -talked to pt/daughter/sister and friend about poor prognosis, DNR now, wants med Rx including Dialysis if needed  NSTEMI/Demand Ischemia -in part triggered by excess volume and anemia -medical management planned for now, unable to give more blood at this time due to pulm edema -continue betablocker/statin/nitro gtt and morphine PRN -FU ECHO  Acute on chronic anemia:  -Hgb 7 on admission, was 8 at the time of discharge, got 4 units PRBC last admission -report of melanic loose stools, none overnight, not stable for endoscopic evaluation -s/p 1 unit PRBC -Coumadin on hold, check INR today  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 BNP on presentation 1333 with pulm edema -IV lasix per Renal - continue blocker  Hypotension: low pressures on admission. On chronic steroids adn on multiple antihypertensives. Suspect some element of adrenal insufficiency - continue stress dose steroids today  AKI on CKD IV: s/p renal tranplant. Cr elevated to 3.4. Baseline 2.3.  -lasix and metolazone as above -continue prograf, mycophenolate, stop bicarb - nephrology consulting - follow BMET   H/o Enterococcus Bacteremia:  -Enterococcus w/ septic arthritis of R shoulder.  TEE negative for vegetations. Refused I&D of R shoulder previously. -seen by ID last admission - continue ampicillin (end date 04/26/15)  ANtiphospholipid syndrome:  - on warfarin - warfarin on hold in setting of GI bleed and anemia  Hx of Right fibular and medial malleolus fracture:  -Followed by Dr. Wandra Feinstein to prior cardiac issues patient was not considered a candidate for operative correction.  DVT Prophylaxis  :  SCDs  Code Status : DNR Family Communication  : discussed with daughter, sister and friend at bedside Disposition Plan  : admit to SDU, still waiting in ER  Consults  :  Cardiology, Renal, Palliative medicine  Lab Results  Component Value Date   PLT 182 04/19/2015     Anti-infectives    Start     Dose/Rate Route Frequency Ordered Stop   04/21/2015 1815  ampicillin (OMNIPEN) 2 g in sodium chloride 0.9 % 50 mL IVPB     2 g 150 mL/hr over 20 Minutes Intravenous Every 8 hours 03/29/2015 1500          Objective:   Filed Vitals:   04/19/15 1115 04/19/15 1145 04/19/15 1200 04/19/15 1215  BP: 121/73 121/62 111/73 121/68  Pulse: 106 109 108 107  Temp:      TempSrc:      Resp: 22 25 27 26   Height:      Weight:      SpO2: 98% 95% 96% 96%    Wt Readings from Last 3 Encounters:  04/06/2015 72.576 kg (160 lb)  04/14/15 70.761 kg (156  lb)  03/27/15 75.18 kg (165 lb 11.9 oz)     Intake/Output Summary (Last 24 hours) at 04/19/15 1223 Last data filed at 04/19/15 0906  Gross per 24 hour  Intake      0 ml  Output    750 ml  Net   -750 ml     Physical Exam  Somnolent but arousable, in mod resp distress Snyder.AT,PERRAL Supple Neck, positive JVD, No cervical lymphadenopathy appriciated.  Lungs: diffuse crackles CVS: RRR,No Gallops,Rubs or new Murmurs, No Parasternal Heave +ve B.Sounds, Abd Soft, No tenderness, No organomegaly appriciated, No rebound - guarding or rigidity. Ext: 2-3plus edema    Data Review:    CBC  Recent Labs Lab 04/13/15 0406  04/14/15 0520 04/07/2015 0912 04/10/2015 1751 04/19/15 0359  WBC 7.7 6.7 14.8* 11.5* 11.5*  HGB 9.8* 8.2* 7.0* 7.7* 7.6*  HCT 31.6* 26.4* 22.3* 24.1* 24.1*  PLT 109* 104* 174 156 182  MCV 89.5 90.4 88.5 89.6 89.9  MCH 27.8 28.1 27.8 28.6 28.4  MCHC 31.0 31.1 31.4 32.0 31.5  RDW 17.2* 17.0* 17.1* 16.4* 16.7*  LYMPHSABS  --   --  3.0  --   --   MONOABS  --   --  1.0  --   --   EOSABS  --   --  0.6  --   --   BASOSABS  --   --  0.0  --   --     Chemistries   Recent Labs Lab 04/13/15 0406 04/14/15 0520 03/25/2015 0912 04/17/2015 1751 04/19/15 0359  NA 139 137 139  --  134*  K 4.4 4.3 4.2  --  4.1  CL 101 101 104  --  99*  CO2 27 28 22   --  21*  GLUCOSE 111* 106* 110*  --  269*  BUN 43* 41* 51*  --  53*  CREATININE 2.69* 2.62* 3.49*  --  3.66*  CALCIUM 9.2 8.8* 9.4  --  8.8*  MG  --   --   --  1.9  --   AST  --   --   --   --  33  ALT  --   --   --   --  12*  ALKPHOS  --   --   --   --  63  BILITOT  --   --   --   --  0.9   ------------------------------------------------------------------------------------------------------------------ No results for input(s): CHOL, HDL, LDLCALC, TRIG, CHOLHDL, LDLDIRECT in the last 72 hours.  Lab Results  Component Value Date   HGBA1C 5.4 08/24/2013   ------------------------------------------------------------------------------------------------------------------ No results for input(s): TSH, T4TOTAL, T3FREE, THYROIDAB in the last 72 hours.  Invalid input(s): FREET3 ------------------------------------------------------------------------------------------------------------------  Recent Labs  04/19/15 0359  TIBC 134*  IRON 31    Coagulation profile  Recent Labs Lab 04/13/15 0406 04/14/15 0520 03/30/2015 0912 04/19/15 0359  INR 4.34* 4.54* 3.00* 3.09*    No results for input(s): DDIMER in the last 72 hours.  Cardiac Enzymes  Recent Labs Lab 04/17/2015 1751  TROPONINI 0.95*    ------------------------------------------------------------------------------------------------------------------    Component Value Date/Time   BNP 2795.1* 04/19/2015 0400    Inpatient Medications  Scheduled Meds: . allopurinol  200 mg Oral Daily  . atorvastatin  40 mg Oral q1800  . calcitRIOL  0.25 mcg Oral QODAY  . [START ON 04/20/2015] calcitRIOL  0.5 mcg Oral QODAY  . carvedilol  6.25 mg Oral BID WC  . [START ON 04/26/2015] darbepoetin (ARANESP) injection -  NON-DIALYSIS  200 mcg Subcutaneous Q Tue-1800  . ferric gluconate (FERRLECIT/NULECIT) IV  125 mg Intravenous Daily  . hydrocortisone sod succinate (SOLU-CORTEF) inj  50 mg Intravenous Q8H  . latanoprost  1 drop Both Eyes QHS  . magnesium oxide  400 mg Oral BID  . mycophenolate  180 mg Oral BID  . ranolazine  500 mg Oral BID  . sodium bicarbonate  1,300 mg Oral BID  . sodium chloride flush  3 mL Intravenous Q12H  . sodium chloride flush  3 mL Intravenous Q12H  . tacrolimus  2 mg Oral BID  . timolol  1 drop Both Eyes BID   Continuous Infusions: . sodium chloride    . sodium chloride    . sodium chloride    . sodium chloride    . ampicillin IVPB 2 gram/NS 50 mL Stopped (04/19/15 0348)  . furosemide 160 mg (04/19/15 1211)  . nitroGLYCERIN     PRN Meds:.sodium chloride, acetaminophen **OR** acetaminophen, fentaNYL (SUBLIMAZE) injection, levalbuterol, morphine injection, nitroGLYCERIN, ondansetron **OR** ondansetron (ZOFRAN) IV, oxyCODONE-acetaminophen, sodium chloride flush  Micro Results No results found for this or any previous visit (from the past 240 hour(s)).  Radiology Reports Ir Fluoro Guide Cv Line Left  04/14/2015  INDICATION: 65 year old with bacteremia. Long-term central venous access is needed. EXAM: PICC LINE PLACEMENT WITH ULTRASOUND AND FLUOROSCOPIC GUIDANCE MEDICATIONS: None ANESTHESIA/SEDATION: None FLUOROSCOPY TIME:  Fluoroscopy Time: 1 minutes and 54 seconds, 1.6 mGy COMPLICATIONS: None immediate.  PROCEDURE: Informed consent was obtained for a PICC line placement. The left arm was prepped with chlorhexidine, draped in the usual sterile fashion using maximum barrier technique (cap and mask, sterile gown, sterile gloves, large sterile sheet, hand hygiene and cutaneous antiseptic). Local anesthesia was attained by infiltration with 1% lidocaine. Ultrasound demonstrated patency of the left brachial vein, and this was documented with an image. Under real-time ultrasound guidance, this vein was accessed with a 21 gauge micropuncture needle and image documentation was performed. The needle was exchanged over a guidewire for a peel-away sheath through which a 43 cm 5 [place] dual lumen power injectable PICC was advanced, and positioned with its tip at the lower SVC/right atrial junction. Fluoroscopy during the procedure and fluoro spot radiograph confirms appropriate catheter position. The catheter was flushed, secured to the skin, and covered with a sterile dressing. IMPRESSION: Successful placement of a left arm PICC with sonographic and fluoroscopic guidance. The catheter is ready for use. Electronically Signed   By: Markus Daft M.D.   On: 04/14/2015 13:22   Ir US Guide Vasc Access Left  04/14/2015  INDICATION: 65 year old with bacteremia. Long-term central venous access is needed. EXAM: PICC LINE PLACEMENT WITH ULTRASOUND AND FLUOROSCOPIC GUIDANCE MEDICATIONS: None ANESTHESIA/SEDATION: None FLUOROSCOPY TIME:  Fluoroscopy Time: 1 minutes and 54 seconds, 1.6 mGy COMPLICATIONS: None immediate. PROCEDURE: Informed consent was obtained for a PICC line placement. The left arm was prepped with chlorhexidine, draped in the usual sterile fashion using maximum barrier technique (cap and mask, sterile gown, sterile gloves, large sterile sheet, hand hygiene and cutaneous antiseptic). Local anesthesia was attained by infiltration with 1% lidocaine. Ultrasound demonstrated patency of the left brachial vein, and this was  documented with an image. Under real-time ultrasound guidance, this vein was accessed with a 21 gauge micropuncture needle and image documentation was performed. The needle was exchanged over a guidewire for a peel-away sheath through which a 43 cm 5 [place] dual lumen power injectable PICC was advanced, and positioned with its tip at the lower  SVC/right atrial junction. Fluoroscopy during the procedure and fluoro spot radiograph confirms appropriate catheter position. The catheter was flushed, secured to the skin, and covered with a sterile dressing. IMPRESSION: Successful placement of a left arm PICC with sonographic and fluoroscopic guidance. The catheter is ready for use. Electronically Signed   By: Markus Daft M.D.   On: 04/14/2015 13:22   Dg Chest Portable 1 View  04/01/2015  CLINICAL DATA:  Shortness of breath EXAM: PORTABLE CHEST 1 VIEW COMPARISON:  Chest radiograph from earlier today. FINDINGS: Left PICC terminates in the upper third of the superior vena cava. Stable cardiomediastinal silhouette with top-normal heart size. No pneumothorax. Stable trace bilateral pleural effusions. There is significant worsening of severe fluffy parahilar opacities throughout both lungs. IMPRESSION: 1. Significant worsening of severe fluffy parahilar opacities throughout both lungs, favor worsening severe pulmonary edema / ARDS. 2. Stable trace bilateral pleural effusions. Electronically Signed   By: Ilona Sorrel M.D.   On: 04/20/2015 16:47   Dg Chest Portable 1 View  03/26/2015  CLINICAL DATA:  Recurrent left-sided chest pain since 3:30 a.m. today, intermittent relief with nitroglycerin; history of atrial fibrillation, hypertension, and end-stage renal disease EXAM: PORTABLE CHEST 1 VIEW COMPARISON:  Chest x-ray of March 16th 2017 FINDINGS: The pulmonary interstitial markings remain increased but are slightly less conspicuous today. Confluent alveolar opacity in the left mid lung is stable. The heart is top-normal in  size. The pulmonary vascularity is engorged but more distinct today. A PICC line tip projects over the midportion of the SVC. IMPRESSION: Mild pulmonary interstitial edema consistent with CHF. Atelectasis or pneumonia in the left mid lung. Slight overall improvement since the study of 11 days ago. Electronically Signed   By: David  Martinique M.D.   On: 03/29/2015 09:33   Dg Chest Port 1 View  04/07/2015  CLINICAL DATA:  Congestive heart failure EXAM: PORTABLE CHEST 1 VIEW COMPARISON:  04/06/2015 FINDINGS: Congestive heart failure with pulmonary edema shows mild interval improvement. Small pleural effusions bilaterally. Central venous catheter tip in the SVC unchanged. IMPRESSION: Mild improvement in pulmonary edema. Electronically Signed   By: Franchot Gallo M.D.   On: 04/07/2015 07:28   Dg Chest Port 1 View  04/06/2015  CLINICAL DATA:  Chest pain EXAM: PORTABLE CHEST 1 VIEW COMPARISON:  March 26, 2015 FINDINGS: There is extensive airspace consolidation throughout the lungs bilaterally, more pronounced on the left than on the right. There is underlying interstitial edema. There is mild cardiomegaly with mild pulmonary venous hypertension. Central catheter tip is at the junction of the left innominate vein and superior vena cava. No pneumothorax. There is arthropathy in both shoulders. IMPRESSION: Widespread airspace consolidation bilaterally, more on the left than on the right, progressed from most recent prior study. Underlying interstitial edema. Suspect widespread pneumonia superimposed on congestive heart failure. Aspiration could present in this manner as well. No pneumothorax evident. Electronically Signed   By: Lowella Grip III M.D.   On: 04/06/2015 12:03   Dg Chest Port 1 View  03/26/2015  CLINICAL DATA:  Sepsis.  Pneumonia. EXAM: PORTABLE CHEST 1 VIEW COMPARISON:  March 25, 2015 FINDINGS: No pneumothorax. Diffuse left lung infiltrate is again identified, obscuring the left heart border, similar in  the interval given difference in technique. No other changes. Stable left IJ. IMPRESSION: Stable diffuse left lung infiltrate.  No other changes. Electronically Signed   By: Dorise Bullion III M.D   On: 03/26/2015 09:26   Dg Chest Port 1 View  03/25/2015  CLINICAL DATA:  Central line placement. EXAM: PORTABLE CHEST 1 VIEW COMPARISON:  03/25/2015. FINDINGS: Left IJ central line has been slightly retracted, its tip is at the level of the superior vena cava. Heart size stable. Persistent left lung diffuse infiltrate. Small left pleural effusion. No pneumothorax. IMPRESSION: 1. Left IJ line has been partially retracted, its tip is at the level of the superior vena cava. No pneumothorax. 2.  Diffuse left lung infiltrate again noted. Electronically Signed   By: Marcello Moores  Register   On: 03/25/2015 17:01   Dg Chest Port 1 View  03/25/2015  CLINICAL DATA:  Central line placement EXAM: PORTABLE CHEST 1 VIEW COMPARISON:  03/14/2015 FINDINGS: Left internal jugular central line has been placed and crosses midline to the right side with tip over the anticipated position of the brachiocephalic vein. No pneumothorax. Increased opacity throughout the lingula IMPRESSION: Central line projects with tip over the brachiocephalic vein on the right and should be retracted about 5 cm and re- advanced with repeat imaging. Lingular opacity Critical Value/emergent results were called by telephone at the time of interpretation on 03/25/2015 at 4:15 pm to Sybil, the patient's nurse, who verbally acknowledged these results. Electronically Signed   By: Skipper Cliche M.D.   On: 03/25/2015 16:17   Dg Fluoro Guided Needle Plc Aspiration/injection Loc  03/21/2015  CLINICAL DATA:  Sinusitis, joint effusion and bursitis involving the right shoulder on CT 03/17/2015. EXAM: RIGHT SHOULDER ASPIRATION UNDER FLUOROSCOPY TECHNIQUE: Despite an INR of 3.99, Dr. Meridee Score insisted on a relatively emergent right shoulder aspiration due to the possibility  of sepsis. The increased risk of bleeding was discussed with the patient prior to the procedure, as well as the risk of infection. Patient elected to proceed with the procedure. Informed consent was obtained. An appropriate skin entrance site was determined. The site was marked, prepped with Betadine, draped in the usual sterile fashion, and infiltrated locally with buffered Lidocaine. Based on CT imaging, 80 20 gauge spinal needle was inserted over the mid humeral head and 5-60 cc of serosanguineous fluid returned. No immediate complication. FLUOROSCOPY TIME:  Radiation Exposure Index (as provided by the fluoroscopic device): If the device does not provide the exposure index: Fluoroscopy Time (in minutes and seconds):  0 minutes 24 seconds. Number of Acquired Images:  None. FINDINGS: Despite an INR of 3.99, Dr. Meridee Score insisted on a relatively emergent right shoulder aspiration due to the possibility of sepsis. The increased risk of bleeding was discussed with the patient prior to the procedure, as well as the risk of infection. Patient elected to proceed with the procedure. Informed consent was obtained. An appropriate skin entrance site was determined. The site was marked, prepped with Betadine, draped in the usual sterile fashion, and infiltrated locally with buffered Lidocaine. Based on CT imaging, a 20 gauge spinal needle was inserted over the mid humeral head with return of 5-6 cc of serosanguineous fluid. No immediate complication. IMPRESSION: Technically successful right shoulder aspiration. Electronically Signed   By: Lorin Picket M.D.   On: 03/21/2015 15:16   Dg Hip Unilat With Pelvis 2-3 Views Right  04/08/2015  CLINICAL DATA:  RIGHT hip pain, fell in bathroom, was unable to move RIGHT leg, history hypertension, end-stage renal disease post renal transplant EXAM: DG HIP (WITH OR WITHOUT PELVIS) 2-3V RIGHT COMPARISON:  None FINDINGS: Diffuse osseous demineralization. Minimal narrowing of both  hip joints. SI joint spaces symmetric and preserved. No acute fracture dislocation. Small lytic lesion at the proximal RIGHT  femoral metaphysis 16 x 8 mm, nonspecific. Extensive atherosclerotic calcifications. IMPRESSION: No definite acute fracture dislocation. Lytic lesion at proximal RIGHT femur, nonspecific; this could be seen with myeloma, lytic metastasis, brown tumor in the setting of chronic renal failure. Stability is uncertain due to the lack of prior studies. Consider nonemergent follow-up bone survey to assess for additional lesions. Electronically Signed   By: Lavonia Dana M.D.   On: 04/08/2015 16:48    Time Spent in minutes   37min   Olumide Dolinger M.D on 04/19/2015 at 12:23 PM  Between 7am to 7pm - Pager - [phone]  After 7pm go to [email or web address] - password [facility]  [facility] -  Office  [phone]

## 2015-04-19 NOTE — ED Notes (Signed)
Attempted to call report to [place].

## 2015-04-20 ENCOUNTER — Ambulatory Visit: Payer: Self-pay | Admitting: Licensed Clinical Social Worker

## 2015-04-20 ENCOUNTER — Other Ambulatory Visit: Payer: Self-pay | Admitting: Licensed Clinical Social Worker

## 2015-04-20 ENCOUNTER — Inpatient Hospital Stay (HOSPITAL_COMMUNITY): Payer: Medicare Other

## 2015-04-20 DIAGNOSIS — N179 Acute kidney failure, unspecified: Secondary | ICD-10-CM

## 2015-04-20 DIAGNOSIS — N184 Chronic kidney disease, stage 4 (severe): Secondary | ICD-10-CM

## 2015-04-20 DIAGNOSIS — R0600 Dyspnea, unspecified: Secondary | ICD-10-CM

## 2015-04-20 DIAGNOSIS — J9601 Acute respiratory failure with hypoxia: Secondary | ICD-10-CM

## 2015-04-20 DIAGNOSIS — I5033 Acute on chronic diastolic (congestive) heart failure: Secondary | ICD-10-CM

## 2015-04-20 LAB — RENAL FUNCTION PANEL
ALBUMIN: 1.8 g/dL — AB (ref 3.5–5.0)
ALBUMIN: 2.1 g/dL — AB (ref 3.5–5.0)
ANION GAP: 18 — AB (ref 5–15)
Anion gap: 15 (ref 5–15)
BUN: 59 mg/dL — AB (ref 6–20)
BUN: 59 mg/dL — ABNORMAL HIGH (ref 6–20)
CALCIUM: 8.8 mg/dL — AB (ref 8.9–10.3)
CHLORIDE: 108 mmol/L (ref 101–111)
CO2: 19 mmol/L — ABNORMAL LOW (ref 22–32)
CO2: 20 mmol/L — AB (ref 22–32)
CREATININE: 3.48 mg/dL — AB (ref 0.44–1.00)
Calcium: 8.3 mg/dL — ABNORMAL LOW (ref 8.9–10.3)
Chloride: 102 mmol/L (ref 101–111)
Creatinine, Ser: 3.53 mg/dL — ABNORMAL HIGH (ref 0.44–1.00)
GFR calc Af Amer: 15 mL/min — ABNORMAL LOW (ref >60)
GFR calc non Af Amer: 13 mL/min — ABNORMAL LOW (ref >60)
GFR calc non Af Amer: 13 mL/min — ABNORMAL LOW (ref >60)
GFR, EST AFRICAN AMERICAN: 15 mL/min — AB (ref >60)
GLUCOSE: 125 mg/dL — AB (ref 65–99)
Glucose, Bld: 139 mg/dL — ABNORMAL HIGH (ref 65–99)
PHOSPHORUS: 5 mg/dL — AB (ref 2.5–4.6)
PHOSPHORUS: 4.3 mg/dL (ref 2.5–4.6)
POTASSIUM: 3.6 mmol/L (ref 3.5–5.1)
POTASSIUM: 3.6 mmol/L (ref 3.5–5.1)
SODIUM: 140 mmol/L (ref 135–145)
Sodium: 142 mmol/L (ref 135–145)

## 2015-04-20 LAB — CBC
HEMATOCRIT: 24.2 % — AB (ref 36.0–46.0)
Hemoglobin: 7.7 g/dL — ABNORMAL LOW (ref 12.0–15.0)
MCH: 28.6 pg (ref 26.0–34.0)
MCHC: 31.8 g/dL (ref 30.0–36.0)
MCV: 90 fL (ref 78.0–100.0)
PLATELETS: 203 10*3/uL (ref 150–400)
RBC: 2.69 MIL/uL — ABNORMAL LOW (ref 3.87–5.11)
RDW: 17.2 % — AB (ref 11.5–15.5)
WBC: 17.7 10*3/uL — ABNORMAL HIGH (ref 4.0–10.5)

## 2015-04-20 LAB — LACTIC ACID, PLASMA: Lactic Acid, Venous: 1.3 mmol/L (ref 0.5–2.0)

## 2015-04-20 LAB — PROCALCITONIN: Procalcitonin: 3.16 ng/mL

## 2015-04-20 LAB — PROTIME-INR
INR: 3.47 — ABNORMAL HIGH (ref 0.00–1.49)
Prothrombin Time: 34.1 seconds — ABNORMAL HIGH (ref 11.6–15.2)

## 2015-04-20 LAB — ECHOCARDIOGRAM COMPLETE
HEIGHTINCHES: 61 in
WEIGHTICAEL: 2613.77 [oz_av]

## 2015-04-20 LAB — GLUCOSE, CAPILLARY: Glucose-Capillary: 128 mg/dL — ABNORMAL HIGH (ref 65–99)

## 2015-04-20 LAB — PARATHYROID HORMONE, INTACT (NO CA): PTH: 102 pg/mL — ABNORMAL HIGH (ref 15–65)

## 2015-04-20 MED ORDER — PRISMASOL BGK 4/2.5 32-4-2.5 MEQ/L IV SOLN
INTRAVENOUS | Status: DC
  Administered 2015-04-20 – 2015-04-25 (×32): via INTRAVENOUS_CENTRAL
  Filled 2015-04-20 (×58): qty 5000

## 2015-04-20 MED ORDER — LIDOCAINE HCL (PF) 1 % IJ SOLN
INTRAMUSCULAR | Status: AC
  Administered 2015-04-20: 12:00:00
  Filled 2015-04-20: qty 5

## 2015-04-20 MED ORDER — PRISMASOL BGK 4/2.5 32-4-2.5 MEQ/L IV SOLN
INTRAVENOUS | Status: DC
  Administered 2015-04-20 – 2015-04-25 (×14): via INTRAVENOUS_CENTRAL
  Filled 2015-04-20 (×20): qty 5000

## 2015-04-20 MED ORDER — HEPARIN SODIUM (PORCINE) 1000 UNIT/ML DIALYSIS
1000.0000 [IU] | INTRAMUSCULAR | Status: DC | PRN
  Administered 2015-04-22: 1000 [IU] via INTRAVENOUS_CENTRAL
  Filled 2015-04-20: qty 6
  Filled 2015-04-20 (×2): qty 3
  Filled 2015-04-20: qty 6

## 2015-04-20 MED ORDER — METOPROLOL SUCCINATE ER 25 MG PO TB24: 25.0000 mg | ORAL_TABLET | Freq: Every day | ORAL | Status: DC

## 2015-04-20 MED ORDER — GERHARDT'S BUTT CREAM
TOPICAL_CREAM | Freq: Three times a day (TID) | CUTANEOUS | Status: DC
  Administered 2015-04-20 – 2015-04-21 (×4): via TOPICAL
  Filled 2015-04-20: qty 1

## 2015-04-20 MED ORDER — CHLORHEXIDINE GLUCONATE 0.12 % MT SOLN: 15.0000 mL | Freq: Two times a day (BID) | OROMUCOSAL | Status: DC

## 2015-04-20 MED ORDER — PRISMASOL BGK 4/2.5 32-4-2.5 MEQ/L IV SOLN
INTRAVENOUS | Status: DC
  Administered 2015-04-20 – 2015-04-25 (×15): via INTRAVENOUS_CENTRAL
  Filled 2015-04-20 (×20): qty 5000

## 2015-04-20 MED ORDER — VITAMIN K1 10 MG/ML IJ SOLN
10.0000 mg | Freq: Once | INTRAVENOUS | Status: AC
  Administered 2015-04-20: 10 mg via INTRAVENOUS
  Filled 2015-04-20: qty 1

## 2015-04-20 MED ORDER — CHLORHEXIDINE GLUCONATE 0.12 % MT SOLN
15.0000 mL | Freq: Two times a day (BID) | OROMUCOSAL | Status: DC
  Administered 2015-04-20 – 2015-04-25 (×9): 15 mL via OROMUCOSAL
  Filled 2015-04-20: qty 15

## 2015-04-20 MED ORDER — METOPROLOL TARTRATE 12.5 MG HALF TABLET
12.5000 mg | ORAL_TABLET | Freq: Two times a day (BID) | ORAL | Status: DC
  Administered 2015-04-20 – 2015-04-27 (×13): 12.5 mg via ORAL
  Filled 2015-04-20 (×17): qty 1

## 2015-04-20 MED ORDER — PIPERACILLIN-TAZOBACTAM 3.375 G IVPB 30 MIN
3.3750 g | Freq: Three times a day (TID) | INTRAVENOUS | Status: DC
  Administered 2015-04-20: 3.375 g via INTRAVENOUS
  Filled 2015-04-20 (×3): qty 50

## 2015-04-20 MED ORDER — SODIUM CHLORIDE 0.9 % FOR CRRT
INTRAVENOUS_CENTRAL | Status: DC | PRN
  Administered 2015-04-22: 19:00:00 via INTRAVENOUS_CENTRAL
  Filled 2015-04-20 (×2): qty 1000

## 2015-04-20 MED ORDER — SODIUM CHLORIDE 0.9% FLUSH
10.0000 mL | Freq: Two times a day (BID) | INTRAVENOUS | Status: DC
  Administered 2015-04-20 – 2015-04-28 (×12): 10 mL via INTRAVENOUS

## 2015-04-20 MED ORDER — GERHARDT'S BUTT CREAM
TOPICAL_CREAM | Freq: Three times a day (TID) | CUTANEOUS | Status: DC | PRN
  Administered 2015-04-22 (×2): via TOPICAL
  Administered 2015-04-23 – 2015-04-28 (×4): 1 via TOPICAL
  Filled 2015-04-20 (×3): qty 1

## 2015-04-20 MED ORDER — PIPERACILLIN-TAZOBACTAM 3.375 G IVPB 30 MIN
3.3750 g | Freq: Four times a day (QID) | INTRAVENOUS | Status: DC
  Administered 2015-04-20 – 2015-04-22 (×7): 3.375 g via INTRAVENOUS
  Filled 2015-04-20 (×9): qty 50

## 2015-04-20 MED ORDER — CETYLPYRIDINIUM CHLORIDE 0.05 % MT LIQD: 7.0000 mL | Freq: Two times a day (BID) | OROMUCOSAL | Status: DC

## 2015-04-20 MED ORDER — CETYLPYRIDINIUM CHLORIDE 0.05 % MT LIQD
7.0000 mL | Freq: Two times a day (BID) | OROMUCOSAL | Status: DC
  Administered 2015-04-20 – 2015-04-25 (×12): 7 mL via OROMUCOSAL

## 2015-04-20 MED ORDER — WHITE PETROLATUM GEL
Status: AC
  Administered 2015-04-20: 10:00:00
  Filled 2015-04-20: qty 1

## 2015-04-20 NOTE — Progress Notes (Signed)
Pharmacy Antibiotic Note  Debbie [name] is a 65 y.o. female admitted on 03/29/2015 with pneumonia.  Pharmacy has been consulted for zosyn dosing. Pt is afebrile and WBC is elevated at 17.7. Pt was started on CRRT today. She was previous on ampicillin for enterococcus bacteremia.   Plan: - Zosyn 3.375gm IV Q6H - F/u renal plans, C&S, clinical status  Height: 5\' 1"  (154.9 cm) Weight: 163 lb 5.8 oz (74.1 kg) IBW/kg (Calculated) : 47.8  Temp (24hrs), Avg:97.5 F (36.4 C), Min:96.7 F (35.9 C), Max:98.3 F (36.8 C)   Recent Labs Lab 04/14/15 0520 04/15/2015 0912 04/20/2015 1751 04/19/15 0359 04/20/15 0310  WBC 6.7 14.8* 11.5* 11.5* 17.7*  CREATININE 2.62* 3.49*  --  3.66* 3.48*    Estimated Creatinine Clearance: 14.8 mL/min (by C-G formula based on Cr of 3.48).    Allergies  Allergen Reactions  . Feraheme [Ferumoxytol] Other (See Comments)    Chest pain, pulsating 02/17/15: tolerated Nulecit  . Vancomycin Anaphylaxis, Itching, Swelling and Other (See Comments)    Tongue swell  . Dorzolamide Hcl-Timolol Mal Rash and Other (See Comments)    Eye pain  . Gentamycin [Gentamicin Sulfate] Itching and Swelling  . Latanoprost Rash and Other (See Comments)    Eye pain  . Cefazolin Itching  . Codeine Itching  . Hydrocodone-Acetaminophen Itching  . Penicillins Itching    *tolerated ampicillin during 02/2015 admission Has patient had a PCN reaction causing immediate rash, facial/tongue/throat swelling, SOB or lightheadedness with hypotension:No Has patient had a PCN reaction causing severe rash involving mucus membranes or skin necrosis:No Has patient had a PCN reaction that required hospitalization:No Has patient had a PCN reaction occurring within the last 10 years:No If all of the above answers are "NO", then may proceed with Cephalosporin use.     Antimicrobials this admission: Ampicillin PTA>>3/29 Zosyn 3/29>>  Microbiology results: Pending  Previous admission: 2/20 Blood  - enterococcus  Thank you for allowing pharmacy to be a part of this patient's care.  Olney Monier, Rande Lawman 04/20/2015 2:35 PM

## 2015-04-20 NOTE — Progress Notes (Signed)
   [facility] asked PCCM for R IJ temp HD placement. 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   Monica Becton, MD 04/20/2015, 8:16 AM [facility] Pager [phone] After 3 pm or if no answer, call [phone]

## 2015-04-20 NOTE — Patient Outreach (Signed)
[facility]) Care Management  04/20/2015  KEYERRA MENSCHING 11-Feb-1950 [id number]   Assessment-CSW is aware that patient is still hospitalized and unable to complete visit at [facility]. CSW will continue to monitor patient's status.  Plan-CSW will send in basket message to [facility] Liaison to update her once patient has discharged back to SNF.  [name], BSW, MSW, [email or web address] Phone: [phone] Fax: [phone]

## 2015-04-20 NOTE — Progress Notes (Signed)
Patient has been on the Bipap since this morning. Now wanting the mask to come off. Sats are 98% and there are no signs of respiratory distress. CRRT is on a pt is tolerating well. Dr. Vaughan Browner gave verbal orders for 4 hours on 4 hours off and PRN. RT notified

## 2015-04-20 NOTE — Procedures (Signed)
Arterial Catheter Insertion Procedure Note Debbie [name] [id number] September 26, 1950  Procedure: Insertion of Arterial Catheter  Indications: Blood pressure monitoring and Frequent blood sampling  Procedure Details Consent: Risks of procedure as well as the alternatives and risks of each were explained to the (patient/caregiver).  Consent for procedure obtained. Time Out: Verified patient identification, verified procedure, site/side was marked, verified correct patient position, special equipment/implants available, medications/allergies/relevent history reviewed, required imaging and test results available.  Performed  Maximum sterile technique was used including antiseptics, cap, gloves, gown, hand hygiene, mask and sheet. Skin prep: Chlorhexidine; local anesthetic administered 20 gauge catheter was inserted into right femoral artery using the Seldinger technique.  Evaluation Blood flow good; BP tracing good. Complications: No apparent complications.   Richardson Landry Minor ACNP Maryanna Shape PCCM Pager [phone] till 3 pm If no answer page [phone] 04/20/2015, 10:55 AM

## 2015-04-20 NOTE — Progress Notes (Signed)
Rt attempted to take patient off Bipap and place on a venturi mask at 50%. The patient's sat stayed at 97% but the patient's WOB increased. The patient asked to be placed back on Bipap. Family present, RN notified.

## 2015-04-20 NOTE — Progress Notes (Signed)
[facility] [place] Progress Note Patient Name: Debbie [name] DOB: [birth year] MRN: [id number]   Date of Service  04/20/2015  HPI/Events of Note  Transferred to ICU for CVVH. Has NSTEMI, volume overload. Stable on camera check with good sats and BP.  eICU Interventions  Will continue volume removal via CVVH. Ok to try off Bipap. 4 hrs on and 1-2 hr off.      Intervention Category Intermediate Interventions: Other:  Galo Sayed 04/20/2015, 4:52 PM

## 2015-04-20 NOTE — Progress Notes (Signed)
Notified Dr. Larkin Ina that there was an order for blood cultures but both of the patient's upper extremities are restricted r/t fistula and PICC. Will hold off on cultures for now.

## 2015-04-20 NOTE — Consult Note (Signed)
   [facility] CM Inpatient Consult   04/20/2015  Debbie [name] 1950/12/17 [id number] Patient was active with [facility] with social worker.  Notified of patient's admission status. Will follow as appropriate.  For questions, please contact: Natividad Brood, RN BSN [facility] Liaison  [phone] business mobile phone Toll free office [phone]

## 2015-04-20 NOTE — Procedures (Signed)
Hemodialysis Insertion Procedure Note Debbie [name] [id number] 06/19/1950  Procedure: Insertion of Hemodialysis Catheter Type: 3 port  Indications: Hemodialysis   Procedure Details Consent: Risks of procedure as well as the alternatives and risks of each were explained to the (patient/caregiver).  Consent for procedure obtained. Time Out: Verified patient identification, verified procedure, site/side was marked, verified correct patient position, special equipment/implants available, medications/allergies/relevent history reviewed, required imaging and test results available.  Performed  Maximum sterile technique was used including antiseptics, cap, gloves, gown and hand hygiene. Skin prep: Chlorhexidine; local anesthetic administered A antimicrobial bonded/coated triple lumen catheter was placed in the right femoral vein due to patient being a dialysis patient using the Seldinger technique. Ultrasound guidance used.Yes.   Catheter placed to 20 cm. Blood aspirated via all 3 ports and then flushed x 3. Line sutured x 2 and dressing applied.  Evaluation Blood flow good Complications: No apparent complications Patient did tolerate procedure well. Chest X-ray ordered to verify placement.  CXR: not needed    [facility] ACNP Maryanna Shape PCCM Pager [phone] till 3 pm If no answer page [phone] 04/20/2015, 11:39 AM

## 2015-04-20 NOTE — Progress Notes (Signed)
Subjective: Interval History: has no complaint, feels some better.  Objective: Vital signs in last 24 hours: Temp:  [96.7 F (35.9 C)-98.2 F (36.8 C)] 98.2 F (36.8 C) (03/29 0400) Pulse Rate:  [61-112] 110 (03/29 0400) Resp:  [9-30] 19 (03/29 0700) BP: (62-126)/(22-94) 66/36 mmHg (03/29 0700) SpO2:  [87 %-99 %] 99 % (03/29 0700) FiO2 (%):  [40 %-60 %] 60 % (03/29 0400) Weight:  [74.1 kg (163 lb 5.8 oz)] 74.1 kg (163 lb 5.8 oz) (03/29 0500) Weight change: 1.524 kg (3 lb 5.8 oz)  Intake/Output from previous day: 03/28 0701 - 03/29 0700 In: 540 [IV Piggyback:540] Out: 617 [Urine:617] Intake/Output this shift:    General appearance: alert, cooperative and mildly obese Resp: rales bibasilar and rhonchi bibasilar Cardio: S1, S2 normal and systolic murmur: holosystolic 2/6, blowing at apex GI: pos bs, liver down 6 cm, TX RLQ Extremities: edema 3-4+ and R arm, swollen, L PICC  Lab Results:  Recent Labs  04/19/15 0359 04/20/15 0310  WBC 11.5* 17.7*  HGB 7.6* 7.7*  HCT 24.1* 24.2*  PLT 182 203   BMET:  Recent Labs  04/19/15 0359 04/20/15 0310  NA 134* 142  K 4.1 3.6  CL 99* 108  CO2 21* 19*  GLUCOSE 269* 125*  BUN 53* 59*  CREATININE 3.66* 3.48*  CALCIUM 8.8* 8.3*   No results for input(s): PTH in the last 72 hours. Iron Studies:  Recent Labs  04/19/15 0359  IRON 31  TIBC 134*    Studies/Results: Dg Chest Portable 1 View  04/15/2015  CLINICAL DATA:  Shortness of breath EXAM: PORTABLE CHEST 1 VIEW COMPARISON:  Chest radiograph from earlier today. FINDINGS: Left PICC terminates in the upper third of the superior vena cava. Stable cardiomediastinal silhouette with top-normal heart size. No pneumothorax. Stable trace bilateral pleural effusions. There is significant worsening of severe fluffy parahilar opacities throughout both lungs. IMPRESSION: 1. Significant worsening of severe fluffy parahilar opacities throughout both lungs, favor worsening severe pulmonary  edema / ARDS. 2. Stable trace bilateral pleural effusions. Electronically Signed   By: Ilona Sorrel M.D.   On: 03/23/2015 16:47   Dg Chest Portable 1 View  04/14/2015  CLINICAL DATA:  Recurrent left-sided chest pain since 3:30 a.m. today, intermittent relief with nitroglycerin; history of atrial fibrillation, hypertension, and end-stage renal disease EXAM: PORTABLE CHEST 1 VIEW COMPARISON:  Chest x-ray of March 16th 2017 FINDINGS: The pulmonary interstitial markings remain increased but are slightly less conspicuous today. Confluent alveolar opacity in the left mid lung is stable. The heart is top-normal in size. The pulmonary vascularity is engorged but more distinct today. A PICC line tip projects over the midportion of the SVC. IMPRESSION: Mild pulmonary interstitial edema consistent with CHF. Atelectasis or pneumonia in the left mid lung. Slight overall improvement since the study of 11 days ago. Electronically Signed   By: David  Martinique M.D.   On: 04/20/2015 09:33    I have reviewed the patient's current medications.  Assessment/Plan: 1 Renal TX with AKI  Diuresing but slowly and still vol xs severe.Needs to start RRT, may regain function.  BP???with use CRRT because of this.  Will get access, but coags an issue. 2 Immmunosuppress cont Prograf Pending 3 Anemia will transfuse once on CRRT 4 ^ INR improving 5 Gout 6 HPTH vit D 7 GIB 8 DJD 9 R arm swelling 10  PAF use toprol with low bps. 11 CAD P CRRt, access, lower vol, esa, transfuse,  LOS: 2 days   Debbie [name] L 04/20/2015,8:02 AM

## 2015-04-20 NOTE — Progress Notes (Signed)
Pt status changed to ICU, PCCM to take over, d/c with Dr. Gerrit Friends. Please call [phone] when ready to transfer to [facility] service.  Faye Ramsay, MD  [facility] Pager [phone] Cell [phone]  If 7PM-7AM, please contact night-coverage [email or web address] Password [email or web address]

## 2015-04-20 NOTE — Progress Notes (Signed)
SUBJECTIVE: No chest pain. Still with dyspnea  Tele: sinus tach  BP 75/54 mmHg  Pulse 115  Temp(Src) 97.9 F (36.6 C) (Axillary)  Resp 15  Ht 5\' 1"  (1.549 m)  Wt 163 lb 5.8 oz (74.1 kg)  BMI 30.88 kg/m2  SpO2 99%  LMP  (LMP Unknown)  Intake/Output Summary (Last 24 hours) at 04/20/15 0904 Last data filed at 04/20/15 0500  Gross per 24 hour  Intake    540 ml  Output    617 ml  Net    -77 ml    PHYSICAL EXAM General: Well developed, well nourished. Wearing bipap. Psych:  Good affect, responds appropriately Lungs: Coarse BS bilaterally with no wheezes noted.  Heart: Regular tachy with no murmurs noted. Abdomen: Bowel sounds are present. Soft, non-tender.  Extremities: No lower extremity edema.   LABS: Basic Metabolic Panel:  Recent Labs  04/17/2015 1751 04/19/15 0359 04/20/15 0310  NA  --  134* 142  K  --  4.1 3.6  CL  --  99* 108  CO2  --  21* 19*  GLUCOSE  --  269* 125*  BUN  --  53* 59*  CREATININE  --  3.66* 3.48*  CALCIUM  --  8.8* 8.3*  MG 1.9  --   --   PHOS 4.1 4.6 5.0*   CBC:  Recent Labs  04/05/2015 0912  04/19/15 0359 04/20/15 0310  WBC 14.8*  < > 11.5* 17.7*  NEUTROABS 10.2*  --   --   --   HGB 7.0*  < > 7.6* 7.7*  HCT 22.3*  < > 24.1* 24.2*  MCV 88.5  < > 89.9 90.0  PLT 174  < > 182 203  < > = values in this interval not displayed. Cardiac Enzymes:  Recent Labs  04/19/2015 1751  TROPONINI 0.95*   Current Meds: . sodium chloride   Intravenous Once  . allopurinol  200 mg Oral Daily  . ampicillin IVPB 2 gram/NS 50 mL  2 g Intravenous Q8H  . antiseptic oral rinse  7 mL Mouth Rinse q12n4p  . atorvastatin  40 mg Oral q1800  . calcitRIOL  0.25 mcg Oral QODAY  . calcitRIOL  0.5 mcg Oral QODAY  . chlorhexidine  15 mL Mouth Rinse BID  . [START ON 04/26/2015] darbepoetin (ARANESP) injection - NON-DIALYSIS  200 mcg Subcutaneous Q Tue-1800  . ferric gluconate (FERRLECIT/NULECIT) IV  125 mg Intravenous Daily  . furosemide  160 mg  Intravenous Q6H  . Gerhardt's butt cream   Topical TID  . hydrocortisone sod succinate (SOLU-CORTEF) inj  50 mg Intravenous Q8H  . latanoprost  1 drop Both Eyes QHS  . magnesium oxide  400 mg Oral BID  . metolazone  10 mg Oral Daily  . metoprolol succinate  25 mg Oral QHS  . mycophenolate  180 mg Oral BID  . pantoprazole (PROTONIX) IV  40 mg Intravenous Q12H  . phytonadione (VITAMIN K) IV  10 mg Intravenous Once  . ranolazine  500 mg Oral BID  . sodium chloride flush  3 mL Intravenous Q12H  . tacrolimus  2 mg Oral BID  . timolol  1 drop Both Eyes BID     ASSESSMENT AND PLAN:  1. NSTEMI: Pt admitted with chest pain and dyspnea with volume overload. She has mildly elevated troponin. Not felt to be a cath candidate due to supra-therapeutic INR, anemia and chronic kidney disease. EKG without ischemic changes. I would favor to continue  medical management with ASA, statin, Beta blocker and and Imdur. If she is started on dialysis and her anemia is corrected, would consider cath. She is hypotensive. Will stop Ranexa and will change metoprolol to tartrate at 12.5 mg po BID.   2. Respiratory Distress/Acute on Chronic Diastolic CHF: Echo in [id number] showed EF of 55-60%. Chest x-ray with diffuse infiltrates, likely pulmonary edema. High dose IV Lasix per Nephrology.   3. 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 Creatinine is worsening.   4. Anti-phospholipid antibody syndrome/ history of DVT's: She is on chronic Coumadin. Has been held recently due to supra therapeutic INR. INR is still over 3 and rising.   5. History of PAF: Continue beta blocker. This patients CHA2DS2-VASc Score and unadjusted Ischemic Stroke Rate (% per year) is equal to 7.2 % stroke rate/year from a score of 5 (CHF, HTN, Vascular, Age, Female). Will need coumadin restarted before discharge.   6. Enterococcus Baceteria: TEE showed no evidence of vegetation on 03/22/2015.  7. Chronic Anemia: She  is still anemic. She did not tolerate transfusion attempt due to volume overload. She denies any active evidence of bleeding at this time. Will receive pRBCs after CRRT started.   8. Code Status: DNR   Raechal Raben  3/29/20179:04 AM

## 2015-04-20 NOTE — Progress Notes (Signed)
Pt. Was transported to 2M13 without any complications.

## 2015-04-20 NOTE — Progress Notes (Signed)
  Echocardiogram 2D Echocardiogram has been performed.  Debbie [name] 04/20/2015, 3:10 PM

## 2015-04-20 NOTE — Consult Note (Signed)
PULMONARY / CRITICAL CARE MEDICINE   Name: SEVAN BUFKIN MRN: [id number] DOB: Jan 05, 1951    ADMISSION DATE:  04/05/2015 CONSULTATION DATE:  04/20/15  REFERRING MD:  Deterding  CHIEF COMPLAINT:  Multi factorial Respiratory Failure/ Renal Failure: Need for CVVHD access and CVVHD, transfer to ICU / CCM to assume care until stabalized. Pt. Is a DNR.  HISTORY OF PRESENT ILLNESS:   65 year old female with PMH of anti-phospholipid antibody syndrome/ history of DVT's (on chronic Coumadin), PAF, chronic systolic CHF (EF [id number]), ESRD (s/p transplant in 2010, now on chronic immunosuppression), HTN, HLD, severe pulmonary HTN, and recent NSTEMI in 01/2015 (troponin peak of 13.48 - treated medically) who presents to [facility] ED on 04/07/2015 for evaluation of chest pain. She has subsequently developed acute on chronic stage 4 renal failure, acute on chronic combined systolic and diastolic  CHF ( BNP [id number] edema evolving to respiratory failure requiring BiPAP. She is a DNR and does not wish to be intubated.She is hypotensive and therefore CVVH is the best option to gently diurese her to improve respiratory status.CCM has been consulted and will assume care while in the ICU for CVVHD treatment and until patient is stabalized. PAST MEDICAL HISTORY :  She  has a past medical history of Kidney transplant as cause of abnormal reaction or later complication; Hypertension; Colon polyps; Gall stones (1980); Anti-phospholipid antibody syndrome ([place]); Paroxysmal atrial fibrillation ([place]); Carpal tunnel syndrome (2003); Esophageal stricture; Hemorrhoids; Renal disorder; ESRD (end stage renal disease) on dialysis [facility]); DVT (deep venous thrombosis) ([place]) (1990); History of blood transfusion; Dyslipidemia (07/22/2013); Coronary artery disease; Heart murmur; Pneumonia; GERD (gastroesophageal reflux disease); Arthritis; Glaucoma, left eye; Bilateral carotid artery stenosis; and Pulmonary HTN ([place]).  PAST SURGICAL  HISTORY: She  has past surgical history that includes Nephrectomy transplanted organ (05/2008); Cholecystectomy (1980); Hematoma evacuation (1998); Peritoneal catheter insertion; Peritoneal catheter removal; Carpal tunnel release (Right, 07/2001); Arteriovenous graft placement; Thrombectomy and revision of arterioventous (av) goretex  graft (Right, 07/1999; 04/2002; 09/2002; 12/2005; 09/2007; ); right heart catheterization (N/A, 08/28/2013); and TEE without cardioversion (N/A, 03/22/2015).  Allergies  Allergen Reactions  . Feraheme [Ferumoxytol] Other (See Comments)    Chest pain, pulsating 02/17/15: tolerated Nulecit  . Vancomycin Anaphylaxis, Itching, Swelling and Other (See Comments)    Tongue swell  . Dorzolamide Hcl-Timolol Mal Rash and Other (See Comments)    Eye pain  . Gentamycin [Gentamicin Sulfate] Itching and Swelling  . Latanoprost Rash and Other (See Comments)    Eye pain  . Cefazolin Itching  . Codeine Itching  . Hydrocodone-Acetaminophen Itching  . Penicillins Itching    *tolerated ampicillin during 02/2015 admission Has patient had a PCN reaction causing immediate rash, facial/tongue/throat swelling, SOB or lightheadedness with hypotension:No Has patient had a PCN reaction causing severe rash involving mucus membranes or skin necrosis:No Has patient had a PCN reaction that required hospitalization:No Has patient had a PCN reaction occurring within the last 10 years:No If all of the above answers are "NO", then may proceed with Cephalosporin use.     No current facility-administered medications on file prior to encounter.   Current Outpatient Prescriptions on File Prior to Encounter  Medication Sig  . acetaminophen (TYLENOL) 325 MG tablet Take 2 tablets (650 mg total) by mouth every 6 (six) hours as needed for mild pain (or Fever >/= 101).  Marland Kitchen allopurinol (ZYLOPRIM) 100 MG tablet Take 1 tablet (100 mg total) by mouth daily.  Marland Kitchen ampicillin 2 g in sodium chloride 0.9 % 50  mL Inject  2 g into the vein every 8 (eight) hours. End Date 04/26/15  . atorvastatin (LIPITOR) 40 MG tablet Take 1 tablet (40 mg total) by mouth daily at 6 PM.  . bimatoprost (LUMIGAN) 0.01 % SOLN Place 1 drop into both eyes at bedtime.   . Brinzolamide-Brimonidine (SIMBRINZA) 1-0.2 % SUSP Place 1 drop into the left eye 3 (three) times daily.  . calcitRIOL (ROCALTROL) 0.25 MCG capsule Take 0.25-0.5 mcg by mouth every other day. alternating dosages  . carvedilol (COREG) 6.25 MG tablet Take 1 tablet (6.25 mg total) by mouth 2 (two) times daily with a meal.  . furosemide (LASIX) 40 MG tablet Take 2 tablets (80 mg total) by mouth daily.  . isosorbide mononitrate (IMDUR) 60 MG 24 hr tablet Take 1 tablet (60 mg total) by mouth daily.  Marland Kitchen MAGNESIUM-OXIDE 400 (241.3 MG) MG tablet TAKE 1 TABLET BY MOUTH TWICE DAILY  . mycophenolate (MYFORTIC) 180 MG EC tablet Take 180 mg by mouth 2 (two) times daily.   . nitroGLYCERIN (NITROSTAT) 0.4 MG SL tablet Place 0.4 mg under the tongue every 5 (five) minutes as needed for chest pain.   Marland Kitchen oxyCODONE-acetaminophen (PERCOCET/ROXICET) 5-325 MG tablet Take 0.5-1 tablets by mouth every 6 (six) hours as needed for moderate pain or severe pain.  . potassium chloride (K-DUR) 10 MEQ tablet Take 1 tablet (10 mEq total) by mouth daily.  . predniSONE (DELTASONE) 5 MG tablet Take 5 mg by mouth daily with breakfast.   . RANEXA 500 MG 12 hr tablet TAKE 1 TABLET BY MOUTH TWICE DAILY  . ranitidine (ZANTAC) 150 MG tablet Take 150 mg by mouth daily.   . sodium bicarbonate 650 MG tablet Take 1,300 mg by mouth 2 (two) times daily.   . tacrolimus (PROGRAF) 1 MG capsule Take 2 mg by mouth 2 (two) times daily.   . timolol (TIMOPTIC) 0.5 % ophthalmic solution Place 1 drop into both eyes 2 (two) times daily. Reported on 03/13/2015  . warfarin (COUMADIN) 1 MG tablet Monday and Friday 0.5 mg and 1 mg on all other days-CURRENTLY ON HOLD AS INR STILL 4.54 AS OF 04/14/15-WOULD RESUME AT ABOVE DOSAGE WHEN INR IS  CLOSE TO 2.5. NOTE ON AMPICILLIN TILL 4/4-WHICH LIKELY IS CAUSING VERY LABILE AND ELEVATED INR.    FAMILY HISTORY:  Her indicated that her mother is deceased. She indicated that her father is deceased. She indicated that her maternal grandmother is deceased. She indicated that her maternal grandfather is deceased. She indicated that her paternal grandmother is deceased. She indicated that her paternal grandfather is deceased.   SOCIAL HISTORY: She  reports that she quit smoking about 38 years ago. Her smoking use included Cigarettes. She smoked 0.00 packs per day for 30 years. She has never used smokeless tobacco. She reports that she does not drink alcohol or use illicit drugs.  REVIEW OF SYSTEMS:   Constitutional:   No  weight loss, night sweats,  Fevers, chills, + fatigue, or  lassitude.  HEENT:   No headaches,  Difficulty swallowing,  +Tooth/dental problems, or  Sore throat,                No sneezing, itching, ear ache, nasal congestion, post nasal drip,   CV:  + chest pain ( 2-3),  +Orthopnea, PND, +swelling in lower extremities,+ anasarca, dizziness, palpitations, syncope.   GI  No heartburn, indigestion, abdominal pain, nausea, vomiting, diarrhea, change in bowel habits,+ loss of appetite, bloody stools.   Resp: +  shortness of breath  at rest.  No excess mucus, no productive cough,  No non-productive cough,  No coughing up of blood.  No change in color of mucus.  No wheezing.  No chest wall deformity  Skin: Bruising to left arm  GU: no dysuria, +change in color of urine, no urgency or frequency.  No flank pain, no hematuria   MS:  No joint pain or swelling.  No decreased range of motion from baseline.  + back pain.  Psych:  No change in mood or affect. No depression or anxiety.  No memory loss.    SUBJECTIVE:  Elderly female patient supine in hospital bed , on BiPAP with generalized edema, R>L. She is alert and oriented x 3 and appropriate.Family at bedside  VITAL SIGNS: BP  75/54 mmHg  Pulse 115  Temp(Src) 97.9 F (36.6 C) (Axillary)  Resp 15  Ht 5\' 1"  (1.549 m)  Wt 163 lb 5.8 oz (74.1 kg)  BMI 30.88 kg/m2  SpO2 99%  LMP  (LMP Unknown)  HEMODYNAMICS:  Not requiring pressors at present, but soft BP.  VENTILATOR SETTINGS: Vent Mode:  [-]  FiO2 (%):  [40 %-60 %] 60 %  INTAKE / OUTPUT: I/O last 3 completed shifts: In: 540 [IV Piggyback:540] Out: 1192 [id number]  PHYSICAL EXAMINATION: General: Pleasant elderly african American female, supine in hospital bed on BiPAP  Neuro:  Alert and oriented x 3, appropriate, MAE x 4 HEENT: BiPAP full face mask, otherwise normal Cardiovascular: RRR, no S3, S4, rubs or murmurs Lungs: Resp. Regular and unlabored on BiPAP, rales per bases, rhonchi.CXR 3/27 indicates pulmonary edema/ ARDS Abdomen:  Soft, non-tender, non-distended, BS + x 4 quads Extremities:No clubbing/ cyanosis, 2-3+ pitting edema R>L upper and lower extremities, Left arm beneath PICC with darkened skin. Skin:intact  LABS:  BMET  Recent Labs Lab 04/17/2015 0912 04/19/15 0359 04/20/15 0310  NA 139 134* 142  K 4.2 4.1 3.6  CL 104 99* 108  CO2 22 21* 19*  BUN 51* 53* 59*  CREATININE 3.49* 3.66* 3.48*  GLUCOSE 110* 269* 125*    Electrolytes  Recent Labs Lab 04/19/2015 0912 04/17/2015 1751 04/19/15 0359 04/20/15 0310  CALCIUM 9.4  --  8.8* 8.3*  MG  --  1.9  --   --   PHOS  --  4.1 4.6 5.0*    CBC  Recent Labs Lab 03/27/2015 1751 04/19/15 0359 04/20/15 0310  WBC 11.5* 11.5* 17.7*  HGB 7.7* 7.6* 7.7*  HCT 24.1* 24.1* 24.2*  PLT 156 182 203    Coag's  Recent Labs Lab 03/29/2015 0912 04/19/15 0359 04/20/15 0310  APTT  --  60*  --   INR 3.00* 3.09* 3.47*    Sepsis Markers No results for input(s): LATICACIDVEN, PROCALCITON, O2SATVEN in the last 168 hours.  ABG No results for input(s): PHART, PCO2ART, PO2ART in the last 168 hours.  Liver Enzymes  Recent Labs Lab 04/19/15 0359 04/20/15 0310  AST 33  --   ALT 12*   --   ALKPHOS 63  --   BILITOT 0.9  --   ALBUMIN 1.9* 1.8*    Cardiac Enzymes  Recent Labs Lab 04/02/2015 1751  TROPONINI 0.95*    Glucose No results for input(s): GLUCAP in the last 168 hours.  Imaging No results found.   STUDIES:  03/16/15:  Echo: EF: 55-60% Moderate pulmonary HTN:PA peak pressure: 64 mm Hg (S). Mild MV stenosis Pending repeat Echo done 04/19/15  03/22/15 TEE No evidence vegetation  04/19/2015:  CXR Significant worsening of severe fluffy parahilar opacities throughout both lungs, favor worsening severe pulmonary edema / ARDS.  Stable trace bilateral pleural effusions.   CULTURES: No Pending Cultures  ANTIBIOTICS: Ampicillin 04/14/2015>>>  SIGNIFICANT EVENTS: 03/31/2015 BiPAP  LINES/TUBES: L PICC Line placed prior hospitalization 04/20/15: R Femoral HD cath  DISCUSSION: Multifactorial Respiratory Failure in setting of suspected NSTEMI complicated by acute on chronic renal failure and combined acute on chronicCHF / Anemia. Need for HD cath placement and CVVHD/ transfer to ICU and CVVHD to address volume overload. Long discussion with patient and family re: Change in code status to LCB to allow for pressors for hypotension with CVVHD. No intubation, No CPR, no defib, but pressors for BP and chemical code only.  ASSESSMENT / PLAN:  PULMONARY  A: Respiratory Failure /BiPAP dependent with short breaks Volume overload 2/2 RF and CHF Last CXR 04/22/2015: Pulmonary edema/ARDS  P:  HD cath placement CVVHD per renal BiPAP for SaO2 > 94%  CXR today and daily Lasix per renal Strict I&O    CARDIOVASCULAR  A: NSTEMI ( mildly elevated troponin) Not a cath candidate per cards until HD and correction of anemia Hypotension History PAF Volume overload Acute on chronic combined systolic/diastolic HF EKG no acute changes Anemia  P:  Pressors for MAP of 60 Lasix per renal Trend BNP's Trend troponins  Medical management per cards with ASA, statin,BB  and Imdur Repeat echo( already ordered per Cards) Resume warfarin on D/C  RENAL A:  Acute on Chronic stage 4 RF with renal transplant/ worsening creatinine Positive fluid balance   P:   Lasix per renal BMET daily Trend creatinine CVVHD cath placement per CCM CVVHD per Renal A-Line placement for accurate BP monitoring    GASTROINTESTINAL A:  No Issues at present  P:   NPO until after HD cath placed Heart smart diet    HEMATOLOGIC A:   Anti-Phospholipid antibody syndrome/ History DVT's Elevated INR ( 3.47) Anemia No evidence of bleeding at present P:  Vitamin K ( already ordered) INR daily CBC daily / trend Transfuse for HBG<7    INFECTIOUS A:   Chronically immunosuppressed patient Afebrile Hypotensive No Cultures pending P:   Blood Cultures x 2  Urine Culture Consider broad spectrum antibiotics in setting of chronic immunosupression Vancomycin allergy/ should we start Zyvox? Pro-calcitonin now Monitor fever curve   ENDOCRINE A: Hypotensive      Adrenal Suppression in a pt on chronic steroids    P:   Monitor CBG's  AC HS Cortisol level in setting of hypotension Hydrocortisone as ordered  NEUROLOGIC A:   No current Issues P:   RASS goal: 0    FAMILY  Updated by Richardson Landry Minor at bedside   - Inter-disciplinary family meet or Palliative Care meeting due by:  04/28/15  Magdalen Spatz, AGACNP  Pulmonary and [place] Pager: [phone]  04/20/2015, 9:06 AM

## 2015-04-20 NOTE — Consult Note (Addendum)
WOC consult requested for bilat buttocks.  Pt is familiar to [facility] team from previous admission, refer to progress notes on 3/16 Reason for Consult: bilat buttocks Wound type: MASD (moisture associated skin damage) over the bilateral upper buttocks/sacrum which has evolved into full thickness skin loss Measurement: Left buttock 8X4X.1cm, right buttock 12X6X.1cm, lower bilat buttocks with patchy areas of partail thickness skin loss. Wound bed: red and macerated, small amt yellow drainage, no odor, very painful to touch Periwound:intact skin surrounding affected areas Dressing procedure/placement/frequency: Pt is frequently incontinent of stool and it has been trapped underneath previous dressing.  Leave open to air and apply Dr Everrett Coombe butt cream to protect and soothe affected areas TID and PRN when stooling.  Air mattress to reduce pressure and increase airflow to affected areas.  It will be difficult to promote healing if patient continues to be incontinent.  Stools are not loose enough to be contained by a Flexiseal at this time. Discussed plan of care with family members at the bedside.  They assessed wounds and state they were upset that patient was admitted from a facility with her skin in this condition.  They would like to bring in wipes from home for cleansing instead of washcloths, informed them we do not have any available in the system but they can provide some if desired and leave at the bedside for staff nurse use. Please re-consult if further assistance is needed.  Thank-you,  Julien Girt MSN, [place], [place], [place], [place]

## 2015-04-20 NOTE — Progress Notes (Signed)
Patient taken off of BIPAP for pm meds. Placed patient on 50% VM and patient is tolerating well at this time.

## 2015-04-21 ENCOUNTER — Inpatient Hospital Stay (HOSPITAL_COMMUNITY): Payer: Medicare Other

## 2015-04-21 DIAGNOSIS — B952 Enterococcus as the cause of diseases classified elsewhere: Secondary | ICD-10-CM

## 2015-04-21 DIAGNOSIS — R7881 Bacteremia: Secondary | ICD-10-CM

## 2015-04-21 DIAGNOSIS — I420 Dilated cardiomyopathy: Secondary | ICD-10-CM

## 2015-04-21 LAB — URINE CULTURE: CULTURE: NO GROWTH

## 2015-04-21 LAB — PROCALCITONIN: PROCALCITONIN: 2.32 ng/mL

## 2015-04-21 LAB — TACROLIMUS LEVEL: Tacrolimus (FK506) - LabCorp: 4.6 ng/mL (ref 2.0–20.0)

## 2015-04-21 LAB — RENAL FUNCTION PANEL
ALBUMIN: 2 g/dL — AB (ref 3.5–5.0)
ALBUMIN: 2 g/dL — AB (ref 3.5–5.0)
Anion gap: 13 (ref 5–15)
Anion gap: 13 (ref 5–15)
BUN: 34 mg/dL — AB (ref 6–20)
BUN: 20 mg/dL (ref 6–20)
CALCIUM: 8.8 mg/dL — AB (ref 8.9–10.3)
CO2: 23 mmol/L (ref 22–32)
CO2: 22 mmol/L (ref 22–32)
CREATININE: 1.37 mg/dL — AB (ref 0.44–1.00)
Calcium: 8.9 mg/dL (ref 8.9–10.3)
Chloride: 105 mmol/L (ref 101–111)
Chloride: 105 mmol/L (ref 101–111)
Creatinine, Ser: 2.09 mg/dL — ABNORMAL HIGH (ref 0.44–1.00)
GFR calc Af Amer: 27 mL/min — ABNORMAL LOW (ref >60)
GFR calc non Af Amer: 40 mL/min — ABNORMAL LOW (ref >60)
GFR, EST AFRICAN AMERICAN: 46 mL/min — AB (ref >60)
GFR, EST NON AFRICAN AMERICAN: 24 mL/min — AB (ref >60)
GLUCOSE: 121 mg/dL — AB (ref 65–99)
GLUCOSE: 130 mg/dL — AB (ref 65–99)
PHOSPHORUS: 2.6 mg/dL (ref 2.5–4.6)
Phosphorus: 3 mg/dL (ref 2.5–4.6)
Potassium: 3.8 mmol/L (ref 3.5–5.1)
Potassium: 4 mmol/L (ref 3.5–5.1)
SODIUM: 141 mmol/L (ref 135–145)
SODIUM: 140 mmol/L (ref 135–145)

## 2015-04-21 LAB — PROTIME-INR
INR: 1.61 — AB (ref 0.00–1.49)
Prothrombin Time: 19.2 seconds — ABNORMAL HIGH (ref 11.6–15.2)

## 2015-04-21 LAB — HCV COMMENT:

## 2015-04-21 LAB — C DIFFICILE QUICK SCREEN W PCR REFLEX
C DIFFICLE (CDIFF) ANTIGEN: POSITIVE — AB
C Diff interpretation: POSITIVE
C Diff toxin: POSITIVE — AB

## 2015-04-21 LAB — GLUCOSE, CAPILLARY: Glucose-Capillary: 120 mg/dL — ABNORMAL HIGH (ref 65–99)

## 2015-04-21 LAB — HEPATITIS C ANTIBODY (REFLEX): HCV AB: 0.2 {s_co_ratio} (ref 0.0–0.9)

## 2015-04-21 LAB — MAGNESIUM: MAGNESIUM: 2.3 mg/dL (ref 1.7–2.4)

## 2015-04-21 LAB — HEPATITIS B SURFACE ANTIBODY,QUALITATIVE: HEP B S AB: REACTIVE

## 2015-04-21 LAB — HEPATITIS B CORE ANTIBODY, IGM: Hep B C IgM: NEGATIVE

## 2015-04-21 LAB — HEPATITIS B SURFACE ANTIGEN: Hepatitis B Surface Ag: NEGATIVE

## 2015-04-21 LAB — HEPARIN LEVEL (UNFRACTIONATED): Heparin Unfractionated: 0.55 IU/mL (ref 0.30–0.70)

## 2015-04-21 MED ORDER — MORPHINE SULFATE (PF) 2 MG/ML IV SOLN
2.0000 mg | INTRAVENOUS | Status: DC | PRN
  Administered 2015-04-21 – 2015-04-29 (×9): 2 mg via INTRAVENOUS
  Filled 2015-04-21 (×9): qty 1

## 2015-04-21 MED ORDER — METRONIDAZOLE IN NACL 5-0.79 MG/ML-% IV SOLN
500.0000 mg | Freq: Three times a day (TID) | INTRAVENOUS | Status: DC
  Administered 2015-04-21 – 2015-04-29 (×24): 500 mg via INTRAVENOUS
  Filled 2015-04-21 (×28): qty 100

## 2015-04-21 MED ORDER — HYDROCORTISONE NA SUCCINATE PF 100 MG IJ SOLR
25.0000 mg | Freq: Every day | INTRAMUSCULAR | Status: DC
  Administered 2015-04-22 – 2015-04-27 (×6): 25 mg via INTRAVENOUS
  Filled 2015-04-21: qty 0.5
  Filled 2015-04-21: qty 1
  Filled 2015-04-21: qty 0.5
  Filled 2015-04-21 (×2): qty 1
  Filled 2015-04-21: qty 0.5
  Filled 2015-04-21: qty 1
  Filled 2015-04-21 (×2): qty 0.5
  Filled 2015-04-21 (×2): qty 1
  Filled 2015-04-21: qty 0.5

## 2015-04-21 MED ORDER — SODIUM CHLORIDE 0.9 % IV SOLN
INTRAVENOUS | Status: DC
  Administered 2015-04-27: 20:00:00 via INTRAVENOUS

## 2015-04-21 MED ORDER — HEPARIN (PORCINE) IN NACL 100-0.45 UNIT/ML-% IJ SOLN
600.0000 [IU]/h | INTRAMUSCULAR | Status: DC
  Administered 2015-04-21 – 2015-04-22 (×2): 900 [IU]/h via INTRAVENOUS
  Administered 2015-04-23 – 2015-04-27 (×3): 600 [IU]/h via INTRAVENOUS
  Filled 2015-04-21 (×6): qty 250

## 2015-04-21 NOTE — Progress Notes (Signed)
Daily Progress Note   Patient Name: Debbie [name]       Date: 04/21/2015 DOB: 02-05-1950  Age: 65 y.o. MRN#: [id number] Attending Physician: Rush Landmark, MD Primary Care Physician: No PCP Per Patient Admit Date: 04/17/2015  Reason for Consultation/Follow-up: Establishing goals of care and Psychosocial/spiritual support  Subjective: Debbie [name] is resting quietly in bed.  She looks in my direction by has low vision. Nursing is at bedside and Debbie [name] is receiving continuous dialysis. I share that she looks tired, and she agrees.  We talk about her current treatment plan, and CVVHD.  She tells me that she wants to continue with all treatments at this time.  We talk about being prepared for the future helps [place] to meet the demands.  We talk about her functional status in the future and the likelihood that she will need more skilled care in the future.  She seems surprised by this, and we talk about her health problems continuing and that if she is prepared for possible outcomes, then she will be able to manage.   Length of Stay: 3 days  Current Medications: Scheduled Meds:  . sodium chloride   Intravenous Once  . allopurinol  200 mg Oral Daily  . antiseptic oral rinse  7 mL Mouth Rinse q12n4p  . atorvastatin  40 mg Oral q1800  . calcitRIOL  0.25 mcg Oral QODAY  . calcitRIOL  0.5 mcg Oral QODAY  . chlorhexidine  15 mL Mouth Rinse BID  . [START ON 04/26/2015] darbepoetin (ARANESP) injection - NON-DIALYSIS  200 mcg Subcutaneous Q Tue-1800  . ferric gluconate (FERRLECIT/NULECIT) IV  125 mg Intravenous Daily  . furosemide  160 mg Intravenous Q6H  . Gerhardt's butt cream   Topical TID  . [START ON 04/22/2015] hydrocortisone sod succinate (SOLU-CORTEF) inj  25 mg Intravenous Daily  .  latanoprost  1 drop Both Eyes QHS  . magnesium oxide  400 mg Oral BID  . metolazone  10 mg Oral Daily  . metoprolol tartrate  12.5 mg Oral BID  . mycophenolate  180 mg Oral BID  . piperacillin-tazobactam  3.375 g Intravenous Q6H  . sodium chloride flush  10 mL Intravenous Q12H  . tacrolimus  2 mg Oral BID  . timolol  1 drop Both Eyes BID  Continuous Infusions: . heparin    . nitroGLYCERIN    . dialysis replacement fluid (prismasate) 700 mL/hr at 04/21/15 1036  . dialysis replacement fluid (prismasate) 700 mL/hr at 04/21/15 1038  . dialysate (PRISMASATE) 2,000 mL/hr at 04/21/15 1041    PRN Meds: acetaminophen **OR** acetaminophen, fentaNYL (SUBLIMAZE) injection, Gerhardt's butt cream, heparin, levalbuterol, morphine injection, nitroGLYCERIN, ondansetron **OR** ondansetron (ZOFRAN) IV, oxyCODONE-acetaminophen, sodium chloride  Physical Exam: Physical Exam  Constitutional: No distress.  Looks tired  Cardiovascular: Normal rate and regular rhythm.   Pulmonary/Chest: Effort normal.  Abdominal: Soft.  Neurological: She is alert.  Skin: Skin is warm and dry.  Nursing note and vitals reviewed.               Vital Signs: BP 93/48 mmHg  Pulse 96  Temp(Src) 97.4 F (36.3 C) (Oral)  Resp 23  Ht 5\' 1"  (1.549 m)  Wt 70.3 kg (154 lb 15.7 oz)  BMI 29.30 kg/m2  SpO2 96%  LMP  (LMP Unknown) SpO2: SpO2: 96 % O2 Device: O2 Device: Nasal Cannula O2 Flow Rate: O2 Flow Rate (L/min): 5 L/min  Intake/output summary:  Intake/Output Summary (Last 24 hours) at 04/21/15 1325 Last data filed at 04/21/15 1100  Gross per 24 hour  Intake   1019 ml  Output   4287 ml  Net  -3268 ml   LBM: Last BM Date: 04/20/15 Baseline Weight: Weight: 72.576 kg (160 lb) Most recent weight: Weight: 70.3 kg (154 lb 15.7 oz)       Palliative Assessment/Data: Flowsheet Rows        Most Recent Value   Intake Tab    Referral Department  Hospitalist   Unit at Time of Referral  ER   Palliative Care Primary  Diagnosis  Cardiac   Date Notified  03/29/2015   Palliative Care Type  New Palliative care   Reason for referral  Clarify Goals of Care   Date of Admission  04/19/2015   # of days IP prior to Palliative referral  0   Clinical Assessment    Palliative Performance Scale Score  30%   Pain Max last 24 hours  Not able to report   Pain Min Last 24 hours  Not able to report   Dyspnea Max Last 24 Hours  Not able to report   Dyspnea Min Last 24 hours  Not able to report   Psychosocial & Spiritual Assessment    Palliative Care Outcomes    Patient/Family meeting held?  Yes   Who was at the meeting?  patinet, niece Lorenso Courier, friend Conless "Peaches"   Palliative Care Outcomes  Clarified goals of care, Provided psychosocial or spiritual support, Provided advance care planning   Palliative Care follow-up planned  No      Additional Data Reviewed: CBC    Component Value Date/Time   WBC 17.7* 04/20/2015 0310   RBC 2.69* 04/20/2015 0310   RBC 3.17* 02/15/2015 0527   HGB 7.7* 04/20/2015 0310   HCT 24.2* 04/20/2015 0310   PLT 203 04/20/2015 0310   MCV 90.0 04/20/2015 0310   MCH 28.6 04/20/2015 0310   MCHC 31.8 04/20/2015 0310   RDW 17.2* 04/20/2015 0310   LYMPHSABS 3.0 03/31/2015 0912   MONOABS 1.0 04/22/2015 0912   EOSABS 0.6 04/03/2015 0912   BASOSABS 0.0 04/14/2015 0912    CMP     Component Value Date/Time   NA 141 04/21/2015 0350   K 3.8 04/21/2015 0350   CL 105 04/21/2015 0350  CO2 23 04/21/2015 0350   GLUCOSE 121* 04/21/2015 0350   BUN 34* 04/21/2015 0350   CREATININE 2.09* 04/21/2015 0350   CALCIUM 8.9 04/21/2015 0350   PROT 4.7* 04/19/2015 0359   ALBUMIN 2.0* 04/21/2015 0350   AST 33 04/19/2015 0359   ALT 12* 04/19/2015 0359   ALKPHOS 63 04/19/2015 0359   BILITOT 0.9 04/19/2015 0359   GFRNONAA 24* 04/21/2015 0350   GFRAA 27* 04/21/2015 0350       Problem List:  Patient Active Problem List   Diagnosis Date Noted  . Acute respiratory failure with hypoxia ([place])     . Palliative care encounter   . Hypotension 03/25/2015  . PAF (paroxysmal atrial fibrillation) ([place]) 04/16/2015  . Acute on chronic diastolic CHF (congestive heart failure), NYHA class 1 ([place]) 03/31/2015  . Wheezing 04/03/2015  . Acute GI bleeding 03/27/2015  . Paroxysmal atrial fibrillation ([facility])   . Acute on chronic diastolic (congestive) heart failure ([place])   . Anemia 04/06/2015  . Acute congestive heart failure ([place]) 04/06/2015  . Effusion of shoulder joint   . Pyogenic arthritis of right shoulder region ([place])   . Arm pain, right 03/17/2015  . Enterococcal bacteremia   . Bacteremia   . Renal transplant recipient 03/14/2015  . Coagulopathy ([place]) 03/14/2015  . ARF (acute renal failure) ([place]) 03/14/2015  . Acute renal failure superimposed on stage 4 chronic kidney disease ([place]) 03/14/2015  . Cardiomyopathy, ischemic-40-45% 03/14/2015  . Supratherapeutic INR 03/14/2015  . LBBB (left bundle branch block) 03/14/2015  . Anemia of chronic kidney failure 03/14/2015  . Hypokalemia   . Hyperkalemia   . NSTEMI (non-ST elevated myocardial infarction) ([place])   . Anemia in CKD (chronic kidney disease)   . Acute on chronic renal failure ([place]) 02/15/2015  . Acute systolic heart failure ([place]) 02/15/2015  . Absolute anemia   . Acute renal failure superimposed on stage 3 chronic kidney disease ([place]) 02/14/2015  . Dehydration 02/14/2015  . Chest pain 02/14/2015  . Pulmonary hypertension ([place]) 09/29/2013  . AKI (acute kidney injury) ([place]) 08/29/2013  . Dyslipidemia 07/22/2013  . Carotid bruit 07/22/2013  . Chronic anticoagulation - with Coumadin 07/06/2013  . Stable angina ([place]) 06/30/2013  . HTN (hypertension) 06/30/2013  . Diarrhea 06/21/2011  . Neutropenic fever ([place]) 04/17/2011  . Hyponatremia 04/17/2011  . CKD (chronic kidney disease), stage IV ([place]) 04/17/2011  . Kidney transplant as cause of abnormal reaction or later complication      Palliative Care Assessment & Plan     1.Code Status:  DNR    Code Status Orders        Start     Ordered   04/20/15 1035  Limited resuscitation (code)   Continuous    Question Answer Comment  In the event of cardiac or respiratory ARREST: Initiate Code Blue, Call Rapid Response No   In the event of cardiac or respiratory ARREST: Perform CPR No   In the event of cardiac or respiratory ARREST: Perform Intubation/Mechanical Ventilation No   In the event of cardiac or respiratory ARREST: Use NIPPV/BiPAp only if indicated Yes   In the event of cardiac or respiratory ARREST: Administer ACLS medications if indicated Yes   In the event of cardiac or respiratory ARREST: Perform Defibrillation or Cardioversion if indicated No      04/20/15 1034    Code Status History    Date Active Date Inactive Code Status Order ID Comments User Context   04/19/2015 11:55 AM 04/20/2015 10:34 AM DNR  [id number]  Domenic Polite, MD ED   03/29/2015  3:01 PM 04/19/2015 11:55 AM Partial Code [id number]  Waldemar Dickens, MD ED   04/06/2015  9:37 PM 04/14/2015  5:35 PM DNR [id number]  Dionne Milo, NP Inpatient   04/06/2015  3:05 PM 04/06/2015  9:37 PM DNR [id number]  Dionne Milo, NP ED   03/14/2015  3:26 AM 03/28/2015  9:46 PM Full Code [id number]  Rise Patience, MD Inpatient   02/15/2015  2:11 AM 02/21/2015  7:26 PM Full Code [id number]  Toy Baker, MD Inpatient   08/28/2013  3:01 PM 09/01/2013  2:27 PM Full Code [id number]  Belva Crome, MD Inpatient   08/27/2013  5:20 AM 08/28/2013  3:01 PM Full Code [id number]  Grafton Folk, MD Inpatient   08/24/2013  2:00 PM 08/26/2013  4:06 PM Full Code [id number]  Eileen Stanford, PA-C Inpatient   06/30/2013  6:42 PM 07/06/2013  7:30 PM Full Code [id number]  Thurnell Lose, MD Inpatient   04/17/2011  8:57 PM 04/20/2011  6:30 PM Full Code [id number]  Robbie Lis, MD ED       2. Goals of Care/Additional Recommendations:  Continue with all measures except CPR and intubation.   Limitations on Scope of  Treatment: treat the treatable.   Desire for further Chaplaincy support:Not discussed today  Psycho-social Needs: Caregiving  Support/Resources and ICU Family Guide  3. Symptom Management:      1.per hospitalist  4. Palliative Prophylaxis:   Aspiration, Frequent Pain Assessment and Turn Reposition  5. Prognosis: Unable to determine  6. Discharge Planning:  Based on outcomes, we talk about her returning to the SNF and the possibility that she may need to be a resident.    Care plan was discussed with nursing staff, CM, and Dr Broadus John.   Thank you for allowing the Palliative Medicine Team to assist in the care of this patient.   Time In: 1020 Time Out: 1045 Total Time 25 minutes Prolonged Time Billed  no         Drue Novel, NP  04/21/2015, 1:25 PM  Please contact Palliative Medicine Team phone at [phone] for questions and concerns.

## 2015-04-21 NOTE — Progress Notes (Signed)
Patient is stable and showing no signs of respiratory distress on 5L nasal cannula. BIPAP not placed on at this time. RT will continue to monitor throughout the night.

## 2015-04-21 NOTE — Progress Notes (Signed)
PCCM INTERVAL PROGRESS NOTE  C. Dif positive, Vancomycin allergy also NPO on BiPAP  Plan: Start IV flagyl  Georgann Housekeeper, AGACNP-BC [facility] Pulmonology/Critical Care Pager [phone] or [phone]  04/21/2015 5:05 PM

## 2015-04-21 NOTE — Progress Notes (Signed)
Subjective: Interval History: has no complaint, breathing much better.  .  Objective: Vital signs in last 24 hours: Temp:  [96.6 F (35.9 C)-98.3 F (36.8 C)] 96.6 F (35.9 C) (03/30 0320) Pulse Rate:  [91-120] 94 (03/30 0600) Resp:  [15-29] 19 (03/30 0600) BP: (48-119)/(27-96) 92/63 mmHg (03/30 0600) SpO2:  [97 %-100 %] 100 % (03/30 0600) Arterial Line BP: (126-158)/(54-68) 137/54 mmHg (03/30 0600) FiO2 (%):  [50 %-60 %] 50 % (03/30 0400) Weight:  [70.3 kg (154 lb 15.7 oz)] 70.3 kg (154 lb 15.7 oz) (03/30 0500) Weight change: -3.8 kg (-8 lb 6 oz)  Intake/Output from previous day: 03/29 0701 - 03/30 0700 In: 959 [P.O.:60; I.V.:210; IV Piggyback:590] Out: [id number] [Urine:375] Intake/Output this shift: Total I/O In: 502 [P.O.:60; I.V.:210; IV Piggyback:232] Out: 2391 [Urine:205; Other:2186]  General appearance: alert, cooperative and on BIPAP Resp: rales bibasilar Cardio: S1, S2 normal and systolic murmur: holosystolic 2/6, blowing at apex GI: pos bs, TX RLQ,soft Extremities: edema 3+ and R arm swollen. AVG with B&T in RUA,  Fem cath and a line  Lab Results:  Recent Labs  04/19/15 0359 04/20/15 0310  WBC 11.5* 17.7*  HGB 7.6* 7.7*  HCT 24.1* 24.2*  PLT 182 203   BMET:  Recent Labs  04/20/15 1521 04/21/15 0350  NA 140 141  K 3.6 3.8  CL 102 105  CO2 20* 23  GLUCOSE 139* 121*  BUN 59* 34*  CREATININE 3.53* 2.09*  CALCIUM 8.8* 8.9    Recent Labs  04/19/15 0400  PTH 102*   Iron Studies:  Recent Labs  04/19/15 0359  IRON 31  TIBC 134*    Studies/Results: Dg Chest Port 1 View  04/20/2015  CLINICAL DATA:  Shortness of Breath EXAM: PORTABLE CHEST 1 VIEW COMPARISON:  04/22/2015 FINDINGS: Cardiac shadow is stable. Persistent bilateral infiltrative change is seen and slightly increased from that noted on the prior exam. The left-sided PICC line is again identified with tip now lies within the right innominate vein. No bony abnormality is seen. IMPRESSION:  Slight increase in bilateral infiltrates particularly in the right upper lobe. PICC line as described in the right innominate vein. Electronically Signed   By: Inez Catalina M.D.   On: 04/20/2015 12:38    I have reviewed the patient's current medications.  Assessment/Plan: 1 CKD4/AKI making some urine with diuretics.  CRRT going well , tol vol off, solute/acid/base K ok Will plan to cont vigorous removal and once off BIPAP get to IHD 2 Arm swelling needs shuntogram/angioplasty 3 CAD/MI considering cath 4 Gout controlled 5 Renal TX cont immunosuppress for now 6 DJD 7 Enterococcus sepsis 8 GIB Hb stable 9 ?? pneu on AB P CRRT, see if can wean BIPAP, esa, Fe, immunosuppress    LOS: 3 days   Nikola Blackston L 04/21/2015,6:59 AM

## 2015-04-21 NOTE — Progress Notes (Signed)
ANTICOAGULATION CONSULT NOTE - Initial Consult  Pharmacy Consult for heparin Indication: atrial fibrillation  Allergies  Allergen Reactions  . Feraheme [Ferumoxytol] Other (See Comments)    Chest pain, pulsating 02/17/15: tolerated Nulecit  . Vancomycin Anaphylaxis, Itching, Swelling and Other (See Comments)    Tongue swell  . Dorzolamide Hcl-Timolol Mal Rash and Other (See Comments)    Eye pain  . Gentamycin [Gentamicin Sulfate] Itching and Swelling  . Latanoprost Rash and Other (See Comments)    Eye pain  . Cefazolin Itching  . Codeine Itching  . Hydrocodone-Acetaminophen Itching  . Penicillins Itching    *tolerated ampicillin during 02/2015 admission Has patient had a PCN reaction causing immediate rash, facial/tongue/throat swelling, SOB or lightheadedness with hypotension:No Has patient had a PCN reaction causing severe rash involving mucus membranes or skin necrosis:No Has patient had a PCN reaction that required hospitalization:No Has patient had a PCN reaction occurring within the last 10 years:No If all of the above answers are "NO", then may proceed with Cephalosporin use.     Patient Measurements: Height: 5\' 1"  (154.9 cm) Weight: 154 lb 15.7 oz (70.3 kg) IBW/kg (Calculated) : 47.8 Heparin Dosing Weight: 63kg   Vital Signs: Temp: 97.6 F (36.4 C) (03/30 0845) Temp Source: Axillary (03/30 0845) BP: 93/48 mmHg (03/30 0942) Pulse Rate: 96 (03/30 0942)  Labs:  Recent Labs  04/14/2015 1751  04/19/15 0359 04/20/15 0310 04/20/15 1521 04/21/15 0350  HGB 7.7*  --  7.6* 7.7*  --   --   HCT 24.1*  --  24.1* 24.2*  --   --   PLT 156  --  182 203  --   --   APTT  --   --  60*  --   --   --   LABPROT  --   --  31.3* 34.1*  --  19.2*  INR  --   --  3.09* 3.47*  --  1.61*  CREATININE  --   < > 3.66* 3.48* 3.53* 2.09*  TROPONINI 0.95*  --   --   --   --   --   < > = values in this interval not displayed.  Estimated Creatinine Clearance: 24.1 mL/min (by C-G formula  based on Cr of 2.09).   Medical History: Past Medical History  Diagnosis Date  . Kidney transplant as cause of abnormal reaction or later complication   . Hypertension   . Colon polyps   . Gall stones 1980  . 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 Paroxysmal atrial fibrillation (HCC)   . Carpal tunnel syndrome 2003    lt  . Esophageal stricture   . Hemorrhoids   . Renal disorder   . ESRD (end stage renal disease) on dialysis [facility])     "til I got my transplant in 2010"  . DVT (deep venous thrombosis) (HCC) 1990    LLE  . History of blood transfusion     "related to the lupus"  . Dyslipidemia 07/22/2013  . Coronary artery disease   . Heart murmur     mild MR on echo  . Pneumonia     "3 times now" (08/25/2013)  . GERD (gastroesophageal reflux disease)   . Arthritis     "fingers" (08/25/2013)  . Glaucoma, left eye   . Bilateral carotid artery stenosis     1-39%  . Pulmonary HTN (HCC)     PASP 5mmHg  Medications:  Infusions:  . heparin    . nitroGLYCERIN    . dialysis replacement fluid (prismasate) 700 mL/hr at 04/21/15 0311  . dialysis replacement fluid (prismasate) 700 mL/hr at 04/21/15 0312  . dialysate (PRISMASATE) 2,000 mL/hr at 04/21/15 [id number]    Assessment: 14 yof presented to the hospital with CP. She is on chronic coumadin for hx afib, DVT and anti-phospholipid syndrome. Coumadin was reversed and INR is now subtherapeutic. To start IV heparin. H/H is low and platelets are WNL. No bleeding noted.  Goal of Therapy:  Heparin level 0.3-0.7 units/ml Monitor platelets by anticoagulation protocol: Yes   Plan:  - Heparin gtt 900 units/hr - Check an 8 hour heparin level - Daily heparin level and CBC  Nolen Lindamood, Rande Lawman 04/21/2015,10:27 AM

## 2015-04-21 NOTE — Significant Event (Signed)
CRITICAL VALUE ALERT  Critical value received: positive c.diff  Date of notification:  04/21/14  Time of notification:  1650  Critical value read back: yes  Nurse who received alert: Lenor Coffin, RN  MD notified (1st page): Georgann Housekeeper, ACNP for CCM at [phone]

## 2015-04-21 NOTE — Consult Note (Signed)
PULMONARY / CRITICAL CARE MEDICINE   Name: Debbie [name] MRN: [id number] DOB: 15-Feb-1950    ADMISSION DATE:  04/08/2015 CONSULTATION DATE:  04/20/15  REFERRING MD:  Deterding  CHIEF COMPLAINT:  Multi factorial Respiratory Failure/ Renal Failure: Need for CVVHD access and CVVHD, transfer to ICU / CCM to assume care until stabalized. Pt. Is a DNR.  HISTORY OF PRESENT ILLNESS:   65 year old female with PMH of anti-phospholipid antibody syndrome/ history of DVT's (on chronic Coumadin), PAF, chronic systolic CHF (EF [id number]), ESRD (s/p transplant in 2010, now on chronic immunosuppression), HTN, HLD, severe pulmonary HTN, and recent NSTEMI in 01/2015 (troponin peak of 13.48 - treated medically) who presents [facility] ED on 04/10/2015 for evaluation of chest pain. She has subsequently developed acute on chronic stage 4 renal failure, acute on chronic combined systolic and diastolic  CHF ( BNP [id number] edema evolving to respiratory failure requiring BiPAP. She is a DNR and does not wish to be intubated.She is hypotensive and therefore CVVH is the best option to gently diurese her to improve respiratory status.CCM has been consulted and will assume care while in the ICU for CVVHD treatment and until patient is stabalized.  SUBJECTIVE:  Bipap on , but fails fast off in 20 min cvvhd on going Neg 2.5 liters  VITAL SIGNS: BP 93/48 mmHg  Pulse 96  Temp(Src) 97.6 F (36.4 C) (Axillary)  Resp 23  Ht 5\' 1"  (1.549 m)  Wt 70.3 kg (154 lb 15.7 oz)  BMI 29.30 kg/m2  SpO2 96%  LMP  (LMP Unknown)  HEMODYNAMICS:  Not requiring pressors at present, but soft BP.  VENTILATOR SETTINGS: Vent Mode:  [-]  FiO2 (%):  [50 %] 50 %  INTAKE / OUTPUT: I/O last 3 completed shifts: In: [id number] [P.O.:60; I.V.:230; Other:99; IV [id number] Out: [id number]; Other:3400]  PHYSICAL EXAMINATION: General: BIPAP no distress Neuro:  Alert and oriented x 3, appropriate, MAE x 4 HEENT: jvd  noted Cardiovascular: RRR, no S3, S4, rubs or murmurs Lungs: ronchi, crackles, coarse Abdomen:  Soft, non-tender, non-distended, BS + x 4 quads Extremities:No clubbing/ cyanosis, 3+ pitting edema R>L upper and lower extremities, Left arm beneath PICC with darkened skin. Skin:intact  LABS:  BMET  Recent Labs Lab 04/20/15 0310 04/20/15 1521 04/21/15 0350  NA 142 140 141  K 3.6 3.6 3.8  CL 108 102 105  CO2 19* 20* 23  BUN 59* 59* 34*  CREATININE 3.48* 3.53* 2.09*  GLUCOSE 125* 139* 121*    Electrolytes  Recent Labs Lab 04/05/2015 1751  04/20/15 0310 04/20/15 1521 04/21/15 0350  CALCIUM  --   < > 8.3* 8.8* 8.9  MG 1.9  --   --   --  2.3  PHOS 4.1  < > 5.0* 4.3 3.0  < > = values in this interval not displayed.  CBC  Recent Labs Lab 04/04/2015 1751 04/19/15 0359 04/20/15 0310  WBC 11.5* 11.5* 17.7*  HGB 7.7* 7.6* 7.7*  HCT 24.1* 24.1* 24.2*  PLT 156 182 203    Coag's  Recent Labs Lab 04/19/15 0359 04/20/15 0310 04/21/15 0350  APTT 60*  --   --   INR 3.09* 3.47* 1.61*    Sepsis Markers  Recent Labs Lab 04/20/15 1347 04/20/15 1359 04/21/15 0350  LATICACIDVEN 1.3  --   --   PROCALCITON  --  3.16 2.32    ABG No results for input(s): PHART, PCO2ART, PO2ART in the last 168 hours.  Liver Enzymes  Recent  Labs Lab 04/19/15 0359 04/20/15 0310 04/20/15 1521 04/21/15 0350  AST 33  --   --   --   ALT 12*  --   --   --   ALKPHOS 63  --   --   --   BILITOT 0.9  --   --   --   ALBUMIN 1.9* 1.8* 2.1* 2.0*    Cardiac Enzymes  Recent Labs Lab 03/28/2015 1751  TROPONINI 0.95*    Glucose  Recent Labs Lab 04/20/15 1052 04/21/15 0319  GLUCAP 128* 120*    Imaging Dg Chest Port 1 View  04/20/2015  CLINICAL DATA:  Shortness of Breath EXAM: PORTABLE CHEST 1 VIEW COMPARISON:  04/12/2015 FINDINGS: Cardiac shadow is stable. Persistent bilateral infiltrative change is seen and slightly increased from that noted on the prior exam. The left-sided PICC  line is again identified with tip now lies within the right innominate vein. No bony abnormality is seen. IMPRESSION: Slight increase in bilateral infiltrates particularly in the right upper lobe. PICC line as described in the right innominate vein. Electronically Signed   By: Inez Catalina M.D.   On: 04/20/2015 12:38     STUDIES:  03/16/15:  Echo: EF: 55-60% Moderate pulmonary HTN:PA peak pressure: 64 mm Hg (S). Mild MV stenosis Pending repeat Echo done 04/19/15  03/22/15 TEE No evidence vegetation  Echo 3/29 echo>>>25% to 30%. Doppler parameters are  consistent with both elevated ventricular end-diastolic filling  pressure and elevated left atrial filling pressure, PA 72  04/10/2015: CXR Significant worsening of severe fluffy parahilar opacities throughout both lungs, favor worsening severe pulmonary edema / ARDS.  Stable trace bilateral pleural effusions.   CULTURES: No Pending Cultures  ANTIBIOTICS: Ampicillin 04/19/2015>>>  SIGNIFICANT EVENTS: 04/17/2015 BiPAP 3/30- distress off BIPAP 30 min   LINES/TUBES: L PICC Line placed prior hospitalization 04/20/15: R Femoral HD cath  DISCUSSION: Multifactorial Respiratory Failure in setting of suspected NSTEMI complicated by acute on chronic renal failure and combined acute on chronicCHF / Anemia. Need for HD cath placement and CVVHD/ transfer to ICU and CVVHD to address volume overload. Long discussion with patient and family re: Change in code status to LCB to allow for pressors for hypotension with CVVHD. No intubation, No CPR, no defib, but pressors for BP and chemical code only.  ASSESSMENT / PLAN:  PULMONARY  A: Respiratory Failure /BiPAP dependent with short breaks Volume overload 2/2 RF and CHF Last CXR 04/17/2015: Pulmonary edema/ARDS  P:  Continued volume removal Repeat pcxr for edema and placement picc, so I can correct its position BIPAP off 30 min and failing, replace 4 hr, then break mandatory 30 min-1  hr  CARDIOVASCULAR  A: NSTEMI ( mildly elevated troponin) Not a cath candidate per cards until HD and correction of anemia Hypotension History PAF Volume overload Acute on chronic combined systolic/diastolic HF EKG no acute changes Anemia  P:  Pressors for MAP of 60 Lasix per renal  Medical management per cards with ASA, statin,BB and Imdur - per cards  RENAL A:  Acute on Chronic stage 4 RF with renal transplant/ worsening creatinine Successful neg 2.5 liters  P:   Lasix cvvhd per renal  GASTROINTESTINAL A:  No inidcation gastric Supp  P:   Heart smart diet - renal  HEMATOLOGIC A:   Anti-Phospholipid antibody syndrome/ History DVT's Elevated INR ( 3.47) Anemia No evidence of bleeding at present fib P:  INR 1.6 Consider restart heparin drip (dvt h/o, fib)  INFECTIOUS A:   Chronically  immunosuppressed patient Afebrile Hypotensive No Cultures pending Recent enterococcus bacteremia P:   Zosyn for now Unimpressed pct, if pcxr clearing infiltrates will dc and go back to amp  ENDOCRINE A: Hypotensive      Adrenal Suppression in a pt on chronic steroids    P:   Monitor CBG's  AC HS Hydrocortisone lower to similar to home dose  NEUROLOGIC A:   No current Issues P:   RASS goal: 0   FAMILY  Updated by me and sister  - Inter-disciplinary family meet or Palliative Care meeting due by:  04/28/15  Ccm time 30 min   Lavon Paganini. Titus Mould, MD, [place] Pgr: [facility]

## 2015-04-21 NOTE — Progress Notes (Signed)
[place] for heparin Indication: atrial fibrillation  Allergies  Allergen Reactions  . Feraheme [Ferumoxytol] Other (See Comments)    Chest pain, pulsating 02/17/15: tolerated Nulecit  . Vancomycin Anaphylaxis, Itching, Swelling and Other (See Comments)    Tongue swell  . Dorzolamide Hcl-Timolol Mal Rash and Other (See Comments)    Eye pain  . Gentamycin [Gentamicin Sulfate] Itching and Swelling  . Latanoprost Rash and Other (See Comments)    Eye pain  . Cefazolin Itching  . Codeine Itching  . Hydrocodone-Acetaminophen Itching  . Penicillins Itching    *tolerated ampicillin during 02/2015 admission Has patient had a PCN reaction causing immediate rash, facial/tongue/throat swelling, SOB or lightheadedness with hypotension:No Has patient had a PCN reaction causing severe rash involving mucus membranes or skin necrosis:No Has patient had a PCN reaction that required hospitalization:No Has patient had a PCN reaction occurring within the last 10 years:No If all of the above answers are "NO", then may proceed with Cephalosporin use.     Patient Measurements: Height: 5\' 1"  (154.9 cm) Weight: 154 lb 15.7 oz (70.3 kg) IBW/kg (Calculated) : 47.8 Heparin Dosing Weight: 63kg   Vital Signs: Temp: 97.3 F (36.3 C) (03/30 2034) Temp Source: Oral (03/30 2034) BP: 99/43 mmHg (03/30 2100) Pulse Rate: 94 (03/30 2100)  Labs:  Recent Labs  04/19/15 0359 04/20/15 0310 04/20/15 1521 04/21/15 0350 04/21/15 1515 04/21/15 2115  HGB 7.6* 7.7*  --   --   --   --   HCT 24.1* 24.2*  --   --   --   --   PLT 182 203  --   --   --   --   APTT 60*  --   --   --   --   --   LABPROT 31.3* 34.1*  --  19.2*  --   --   INR 3.09* 3.47*  --  1.61*  --   --   HEPARINUNFRC  --   --   --   --   --  0.55  CREATININE 3.66* 3.48* 3.53* 2.09* 1.37*  --     Estimated Creatinine Clearance: 36.7 mL/min (by C-G formula based on Cr of 1.37).   Medical History: Past  Medical History  Diagnosis Date  . Kidney transplant as cause of abnormal reaction or later complication   . Hypertension   . Colon polyps   . Gall stones 1980  . Anti-phospholipid antibody syndrome [facility])     (Based on hospital discharge summary march 2013)  . Paroxysmal atrial fibrillation (HCC)   . Carpal tunnel syndrome 2003    lt  . Esophageal stricture   . Hemorrhoids   . Renal disorder   . ESRD (end stage renal disease) on dialysis [facility])     "til I got my transplant in 2010"  . DVT (deep venous thrombosis) (HCC) 1990    LLE  . History of blood transfusion     "related to the lupus"  . Dyslipidemia 07/22/2013  . Coronary artery disease   . Heart murmur     mild MR on echo  . Pneumonia     "3 times now" (08/25/2013)  . GERD (gastroesophageal reflux disease)   . Arthritis     "fingers" (08/25/2013)  . Glaucoma, left eye   . Bilateral carotid artery stenosis     1-39%  . Pulmonary HTN (HCC)     PASP 56mmHg    Medications:  Infusions:  .  heparin 900 Units/hr (04/21/15 2000)  . nitroGLYCERIN    . dialysis replacement fluid (prismasate) 700 mL/hr at 04/21/15 1036  . dialysis replacement fluid (prismasate) 700 mL/hr at 04/21/15 1038  . dialysate (PRISMASATE) 2,000 mL/hr at 04/21/15 2051    Assessment: 76 yof presented to the hospital with CP. She is on chronic coumadin for hx afib, DVT and anti-phospholipid syndrome. Coumadin was reversed and INR is now subtherapeutic. To start IV heparin. H/H is low and platelets are WNL. No bleeding noted.  Goal of Therapy:  Heparin level 0.3-0.7 units/ml Monitor platelets by anticoagulation protocol: Yes   Plan:  - Heparin gtt 900 units/hr - Check an 8 hour heparin level - Daily heparin level and CBC  Addendum -HL therapeutic -Continue heparin at 900 units/hr -Next level with AM labs  Harvel Quale  04/21/2015 9:57 PM

## 2015-04-21 NOTE — Progress Notes (Signed)
SUBJECTIVE: Less dyspnea. No chest pain.   BP 58/27 mmHg  Pulse 102  Temp(Src) 96.6 F (35.9 C) (Axillary)  Resp 26  Ht 5\' 1"  (1.549 m)  Wt 154 lb 15.7 oz (70.3 kg)  BMI 29.30 kg/m2  SpO2 98%  LMP  (LMP Unknown)  Intake/Output Summary (Last 24 hours) at 04/21/15 0541 Last data filed at 04/21/15 0500  Gross per 24 hour  Intake    883 ml  Output   3230 ml  Net  -2347 ml    PHYSICAL EXAM General: Well developed, well nourished. Awake, alert, bipap in place.  Psych:  Good affect, responds appropriately Neck: No JVD. No masses noted.  Lungs: Overall clear anterior fields.   Heart: Regular tachy  Abdomen: Bowel sounds are present. Soft, non-tender.  Extremities: No lower extremity edema.   LABS: Basic Metabolic Panel:  Recent Labs  03/27/2015 1751  04/20/15 1521 04/21/15 0350  NA  --   < > 140 141  K  --   < > 3.6 3.8  CL  --   < > 102 105  CO2  --   < > 20* 23  GLUCOSE  --   < > 139* 121*  BUN  --   < > 59* 34*  CREATININE  --   < > 3.53* 2.09*  CALCIUM  --   < > 8.8* 8.9  MG 1.9  --   --  2.3  PHOS 4.1  < > 4.3 3.0  < > = values in this interval not displayed. CBC:  Recent Labs  04/17/2015 0912  04/19/15 0359 04/20/15 0310  WBC 14.8*  < > 11.5* 17.7*  NEUTROABS 10.2*  --   --   --   HGB 7.0*  < > 7.6* 7.7*  HCT 22.3*  < > 24.1* 24.2*  MCV 88.5  < > 89.9 90.0  PLT 174  < > 182 203  < > = values in this interval not displayed. Cardiac Enzymes:  Recent Labs  04/02/2015 1751  TROPONINI 0.95*    Current Meds: . sodium chloride   Intravenous Once  . allopurinol  200 mg Oral Daily  . antiseptic oral rinse  7 mL Mouth Rinse q12n4p  . atorvastatin  40 mg Oral q1800  . calcitRIOL  0.25 mcg Oral QODAY  . calcitRIOL  0.5 mcg Oral QODAY  . chlorhexidine  15 mL Mouth Rinse BID  . [START ON 04/26/2015] darbepoetin (ARANESP) injection - NON-DIALYSIS  200 mcg Subcutaneous Q Tue-1800  . ferric gluconate (FERRLECIT/NULECIT) IV  125 mg Intravenous Daily  .  furosemide  160 mg Intravenous Q6H  . Gerhardt's butt cream   Topical TID  . hydrocortisone sod succinate (SOLU-CORTEF) inj  50 mg Intravenous Q8H  . latanoprost  1 drop Both Eyes QHS  . magnesium oxide  400 mg Oral BID  . metolazone  10 mg Oral Daily  . metoprolol tartrate  12.5 mg Oral BID  . mycophenolate  180 mg Oral BID  . pantoprazole (PROTONIX) IV  40 mg Intravenous Q12H  . piperacillin-tazobactam  3.375 g Intravenous Q6H  . sodium chloride flush  10 mL Intravenous Q12H  . tacrolimus  2 mg Oral BID  . timolol  1 drop Both Eyes BID   Echo 04/20/15: Left ventricle: Septal, apical and inferior wall hypokinesis The  cavity size was moderately dilated. Wall thickness was normal.  Systolic function was severely reduced. The estimated ejection  fraction was in the  range of 25% to 30%. Doppler parameters are  consistent with both elevated ventricular end-diastolic filling  pressure and elevated left atrial filling pressure. - Aortic valve: There was mild regurgitation. - Mitral valve: Calcified annulus. There was mild regurgitation.  Valve area by continuity equation (using LVOT flow): 1.27 cm^2. - Left atrium: The atrium was mildly dilated. - Right atrium: The atrium was mildly dilated. - Atrial septum: No defect or patent foramen ovale was identified. - Tricuspid valve: There was moderate regurgitation. - Pulmonary arteries: PA peak pressure: 72 mm Hg (S).  ASSESSMENT AND PLAN:  1. NSTEMI: Pt admitted with chest pain and dyspnea with volume overload. She has mildly elevated troponin. New wall motion abnormality on echo. She has not been felt to be a cath candidate due to supra-therapeutic INR, anemia and chronic kidney disease. EKG without ischemic changes. Will need cath once renal issues and anemia are corrected. For now, I would favor to continue medical management with ASA, statin, and Imdur as well as beta blocker as BP tolerates. Ranexa stopped due to hypotension.   2.  Cardiomyopathy/Acute on Chronic Diastolic CHF: Echo now with new wall motion abnormality and LVEF=25-30%. EF was normal in February 2017. She is volume overloaded. Will consider cath once renal issues and anemia resolved. She is on high dose IV Lasix per Nephrology and now on CRRT  3. 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 She is now on CRRT.   4. Anti-phospholipid antibody syndrome/ history of DVT's: She is on chronic Coumadin but this has been held recently due to supra therapeutic INR. INR is now under 2.   5. History of PAF: Continue beta blocker. This patients CHA2DS2-VASc Score and unadjusted Ischemic Stroke Rate (% per year) is equal to 7.2 % stroke rate/year from a score of 5 (CHF, HTN, Vascular, Age, Female). Will need coumadin restarted before discharge.   6. Enterococcus [place]: TEE showed no evidence of vegetation on 03/22/2015.  7. Chronic Anemia: She is still anemic. She did not tolerate transfusion attempt due to volume overload. She denies any active evidence of bleeding at this time. Will receive pRBCs with CRRT   8. Code Status: DNR    MCALHANY,CHRISTOPHER  3/30/20175:41 AM

## 2015-04-21 NOTE — Progress Notes (Signed)
Pt placed back on Bipap due to increased WOB.

## 2015-04-21 NOTE — Care Management Important Message (Signed)
Important Message  Patient Details  Name: Debbie [name] MRN: [id number] Date of Birth: Feb 05, 1950   Medicare Important Message Given:  Other (see comment)    [place], Canisha Issac [place] 04/21/2015, 2:05 PM

## 2015-04-21 NOTE — Progress Notes (Signed)
Pt positive for c diff; family educated on enteric precautions, and verbalize and demonstrate understanding.  Lenor Coffin, RN

## 2015-04-21 NOTE — Consult Note (Signed)
WOC re-consulted for MASD bilateral buttocks. [facility] team has seen patient at previous admission and was seen yesterday for same.  Based on my partner's documentation yesterday and bedside nurses assessment area is unchanged.  Continue Gerhardt's.  Patient has flexiseal in place and FC in place.  May alternate between foam dressings as needed and Gerhardt's.  SPORT mattress in place, should the patient move out of ICU for palliative care only, will need LALM (low air loss mattress).  Discussed POC with patient's family and bedside nurse.  Re consult if needed, will not follow at this time. Thanks  Garl Speigner [place], [place] [phone])

## 2015-04-21 NOTE — Progress Notes (Signed)
PCXR report called from radiology stating unchanged position of left Extremity PICC line with tip projecting over the expected location of the central right brachial cephalic vein; recommend reposition; Dr Titus Mould notified; order received to pull back PICC line 4 cm; VATS notified.  Lenor Coffin, RN

## 2015-04-22 ENCOUNTER — Inpatient Hospital Stay (HOSPITAL_COMMUNITY): Payer: Medicare Other

## 2015-04-22 ENCOUNTER — Ambulatory Visit: Payer: Self-pay | Admitting: Licensed Clinical Social Worker

## 2015-04-22 ENCOUNTER — Other Ambulatory Visit: Payer: Self-pay

## 2015-04-22 DIAGNOSIS — K922 Gastrointestinal hemorrhage, unspecified: Secondary | ICD-10-CM

## 2015-04-22 DIAGNOSIS — R609 Edema, unspecified: Secondary | ICD-10-CM

## 2015-04-22 LAB — RENAL FUNCTION PANEL
Albumin: 2 g/dL — ABNORMAL LOW (ref 3.5–5.0)
Albumin: 2 g/dL — ABNORMAL LOW (ref 3.5–5.0)
Anion gap: 12 (ref 5–15)
Anion gap: 12 (ref 5–15)
BUN: 14 mg/dL (ref 6–20)
BUN: 11 mg/dL (ref 6–20)
CO2: 22 mmol/L (ref 22–32)
CO2: 22 mmol/L (ref 22–32)
Calcium: 8.8 mg/dL — ABNORMAL LOW (ref 8.9–10.3)
Calcium: 8.7 mg/dL — ABNORMAL LOW (ref 8.9–10.3)
Chloride: 104 mmol/L (ref 101–111)
Chloride: 105 mmol/L (ref 101–111)
Creatinine, Ser: 1.05 mg/dL — ABNORMAL HIGH (ref 0.44–1.00)
Creatinine, Ser: 0.9 mg/dL (ref 0.44–1.00)
GFR calc Af Amer: >60 mL/min (ref >60)
GFR calc Af Amer: >60 mL/min (ref >60)
GFR calc non Af Amer: 55 mL/min — ABNORMAL LOW (ref >60)
GFR calc non Af Amer: >60 mL/min (ref >60)
Glucose, Bld: 119 mg/dL — ABNORMAL HIGH (ref 65–99)
Glucose, Bld: 146 mg/dL — ABNORMAL HIGH (ref 65–99)
Phosphorus: 2.1 mg/dL — ABNORMAL LOW (ref 2.5–4.6)
Phosphorus: 3.2 mg/dL (ref 2.5–4.6)
Potassium: 3.8 mmol/L (ref 3.5–5.1)
Potassium: 4.2 mmol/L (ref 3.5–5.1)
Sodium: 138 mmol/L (ref 135–145)
Sodium: 139 mmol/L (ref 135–145)

## 2015-04-22 LAB — CBC
HCT: 29.9 % — ABNORMAL LOW (ref 36.0–46.0)
Hemoglobin: 9.3 g/dL — ABNORMAL LOW (ref 12.0–15.0)
MCH: 28 pg (ref 26.0–34.0)
MCHC: 31.1 g/dL (ref 30.0–36.0)
MCV: 90.1 fL (ref 78.0–100.0)
Platelets: 187 10*3/uL (ref 150–400)
RBC: 3.32 MIL/uL — ABNORMAL LOW (ref 3.87–5.11)
RDW: 17.7 % — ABNORMAL HIGH (ref 11.5–15.5)
WBC: 15.5 10*3/uL — ABNORMAL HIGH (ref 4.0–10.5)

## 2015-04-22 LAB — TYPE AND SCREEN
ABO/RH(D): B POS
Antibody Screen: NEGATIVE
UNIT DIVISION: 0
UNIT DIVISION: 0
Unit division: 0

## 2015-04-22 LAB — GLUCOSE, CAPILLARY: Glucose-Capillary: 132 mg/dL — ABNORMAL HIGH (ref 65–99)

## 2015-04-22 LAB — PROTIME-INR
INR: 1.72 — ABNORMAL HIGH (ref 0.00–1.49)
Prothrombin Time: 20.1 seconds — ABNORMAL HIGH (ref 11.6–15.2)

## 2015-04-22 LAB — PROCALCITONIN: PROCALCITONIN: 2.24 ng/mL

## 2015-04-22 LAB — MAGNESIUM: MAGNESIUM: 2.4 mg/dL (ref 1.7–2.4)

## 2015-04-22 LAB — HEPARIN LEVEL (UNFRACTIONATED): Heparin Unfractionated: 0.61 IU/mL (ref 0.30–0.70)

## 2015-04-22 MED ORDER — POTASSIUM PHOSPHATES 15 MMOLE/5ML IV SOLN
30.0000 mmol | Freq: Once | INTRAVENOUS | Status: AC
  Administered 2015-04-22: 30 mmol via INTRAVENOUS
  Filled 2015-04-22: qty 10

## 2015-04-22 MED ORDER — DEXTROSE 5 % IV SOLN: 20.0000 mmol | Freq: Once | INTRAVENOUS | Status: DC

## 2015-04-22 MED ORDER — POTASSIUM PHOSPHATES 15 MMOLE/5ML IV SOLN: 30.0000 mmol | Freq: Once | INTRAVENOUS | Status: DC

## 2015-04-22 MED ORDER — SODIUM CHLORIDE 0.9 % IV SOLN
2.0000 g | Freq: Three times a day (TID) | INTRAVENOUS | Status: DC
  Administered 2015-04-22 – 2015-04-25 (×9): 2 g via INTRAVENOUS
  Filled 2015-04-22 (×10): qty 2000

## 2015-04-22 MED ORDER — LIDOCAINE HCL 1 % IJ SOLN
INTRAMUSCULAR | Status: AC
  Filled 2015-04-22: qty 20

## 2015-04-22 MED ORDER — IOPAMIDOL (ISOVUE-300) INJECTION 61%
INTRAVENOUS | Status: AC
  Filled 2015-04-22: qty 100

## 2015-04-22 MED ORDER — IOHEXOL 300 MG/ML  SOLN
100.0000 mL | Freq: Once | INTRAMUSCULAR | Status: AC | PRN
  Administered 2015-04-22: 60 mL via INTRAVENOUS

## 2015-04-22 MED ORDER — MORPHINE SULFATE (PF) 4 MG/ML IV SOLN
INTRAVENOUS | Status: AC
  Filled 2015-04-22: qty 1

## 2015-04-22 NOTE — Progress Notes (Signed)
*  Preliminary Results* Bilateral upper extremity venous duplex completed. Study was technically limited due to poor patient cooperation and patient position. Visualized veins of bilateral upper extremities are negative for deep and superficial vein thrombosis. Visualized segment of PICC line is patent without any surrounding thrombus.   04/22/2015 11:45 AM  Maudry Mayhew, RVT, RDCS, RDMS

## 2015-04-22 NOTE — Progress Notes (Signed)
SUBJECTIVE: Pt confused. No complaints.   BP 56/48 mmHg  Pulse 88  Temp(Src) 97.5 F (36.4 C) (Oral)  Resp 29  Ht 5\' 1"  (1.549 m)  Wt 147 lb 7.8 oz (66.9 kg)  BMI 27.88 kg/m2  SpO2 98%  LMP  (LMP Unknown)  Intake/Output Summary (Last 24 hours) at 04/22/15 1721 Last data filed at 04/22/15 1500  Gross per 24 hour  Intake   1876 ml  Output   4262 ml  Net  -2386 ml    PHYSICAL EXAM General: Well developed, well nourished, in no acute distress. Alert  Psych:  Awake, disoriented.  Neck: No JVD. No masses noted.  Lungs: Clear bilaterally with no wheezes or rhonci noted.  Heart: Regular, tachycardic with no murmurs noted. Abdomen: Bowel sounds are present. Soft, non-tender.  Extremities: No lower extremity edema.   LABS: Basic Metabolic Panel:  Recent Labs  04/21/15 0350  04/22/15 0357 04/22/15 1400  NA 141  < > 138 139  K 3.8  < > 3.8 4.2  CL 105  < > 104 105  CO2 23  < > 22 22  GLUCOSE 121*  < > 119* 146*  BUN 34*  < > 14 11  CREATININE 2.09*  < > 1.05* 0.90  CALCIUM 8.9  < > 8.8* 8.7*  MG 2.3  --  2.4  --   PHOS 3.0  < > 2.1* 3.2  < > = values in this interval not displayed. CBC:  Recent Labs  04/20/15 0310 04/22/15 0357  WBC 17.7* 15.5*  HGB 7.7* 9.3*  HCT 24.2* 29.9*  MCV 90.0 90.1  PLT 203 187    Current Meds: . allopurinol  200 mg Oral Daily  . ampicillin (OMNIPEN) IV  2 g Intravenous Q8H  . antiseptic oral rinse  7 mL Mouth Rinse q12n4p  . atorvastatin  40 mg Oral q1800  . calcitRIOL  0.25 mcg Oral QODAY  . calcitRIOL  0.5 mcg Oral QODAY  . chlorhexidine  15 mL Mouth Rinse BID  . [START ON 04/26/2015] darbepoetin (ARANESP) injection - NON-DIALYSIS  200 mcg Subcutaneous Q Tue-1800  . ferric gluconate (FERRLECIT/NULECIT) IV  125 mg Intravenous Daily  . furosemide  160 mg Intravenous Q6H  . hydrocortisone sod succinate (SOLU-CORTEF) inj  25 mg Intravenous Daily  . iopamidol      . latanoprost  1 drop Both Eyes QHS  . lidocaine      .  magnesium oxide  400 mg Oral BID  . metolazone  10 mg Oral Daily  . metoprolol tartrate  12.5 mg Oral BID  . metronidazole  500 mg Intravenous Q8H  . morphine      . mycophenolate  180 mg Oral BID  . sodium chloride flush  10 mL Intravenous Q12H  . tacrolimus  2 mg Oral BID  . timolol  1 drop Both Eyes BID     ASSESSMENT AND PLAN:  1. NSTEMI: Pt admitted with chest pain and dyspnea with volume overload in setting of acute on chronic renal failure and found to have mildly elevated troponin. New wall motion abnormality on echo. She has not been felt to be a cath candidate due to supra-therapeutic INR, anemia and chronic kidney disease. EKG without ischemic changes. Will need cath once renal issues and anemia are corrected. For now, I would favor to continue medical management with statin as well as beta blocker as BP tolerates. The ASA and Imdur stopped by primary team. Ranexa  stopped due to hypotension.   2. Cardiomyopathy/Acute on Chronic Diastolic CHF: Echo now with new wall motion abnormality and LVEF=25-30%. EF was normal in February 2017. She is volume overloaded. Will consider cath once renal issues and anemia resolved. She is on high dose IV Lasix per Nephrology and now on CRRT  3. 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 She is now on CRRT.   4. Anti-phospholipid antibody syndrome/ history of DVT's: She is on chronic coumadin but this has been held recently due to supra- therapeutic INR. INR is now under 2.   5. History of PAF: Continue beta blocker. This patients CHA2DS2-VASc Score and unadjusted Ischemic Stroke Rate (% per year) is equal to 7.2 % stroke rate/year from a score of 5 (CHF, HTN, Vascular, Age, Female). Will need coumadin restarted before discharge.    MCALHANY,CHRISTOPHER  3/31/20175:21 PM

## 2015-04-22 NOTE — Progress Notes (Signed)
Pharmacy Antibiotic Note  Debbie [name] is a 65 y.o. female admitted on 04/21/2015 with bacteremia.  Pharmacy has been consulted for Ampicillin dosing.  Plan: Change back to Ampicillin 2g IV q8h through 4/4 to complete course of enterococcus bacteremia.   Height: 5\' 1"  (154.9 cm) Weight: 147 lb 7.8 oz (66.9 kg) IBW/kg (Calculated) : 47.8  Temp (24hrs), Avg:97.3 F (36.3 C), Min:96.5 F (35.8 C), Max:97.6 F (36.4 C)   Recent Labs Lab 04/06/2015 0912 04/01/2015 1751 04/19/15 0359 04/20/15 0310 04/20/15 1347 04/20/15 1521 04/21/15 0350 04/21/15 1515 04/22/15 0357 04/22/15 1400  WBC 14.8* 11.5* 11.5* 17.7*  --   --   --   --  15.5*  --   CREATININE 3.49*  --  3.66* 3.48*  --  3.53* 2.09* 1.37* 1.05* 0.90  LATICACIDVEN  --   --   --   --  1.3  --   --   --   --   --     Estimated Creatinine Clearance: 54.5 mL/min (by C-G formula based on Cr of 0.9).    Allergies  Allergen Reactions  . Feraheme [Ferumoxytol] Other (See Comments)    Chest pain, pulsating 02/17/15: tolerated Nulecit  . Vancomycin Anaphylaxis, Itching, Swelling and Other (See Comments)    Tongue swell  . Dorzolamide Hcl-Timolol Mal Rash and Other (See Comments)    Eye pain  . Gentamycin [Gentamicin Sulfate] Itching and Swelling  . Latanoprost Rash and Other (See Comments)    Eye pain  . Cefazolin Itching  . Codeine Itching  . Hydrocodone-Acetaminophen Itching  . Penicillins Itching    *tolerated ampicillin during 02/2015 admission Has patient had a PCN reaction causing immediate rash, facial/tongue/throat swelling, SOB or lightheadedness with hypotension:No Has patient had a PCN reaction causing severe rash involving mucus membranes or skin necrosis:No Has patient had a PCN reaction that required hospitalization:No Has patient had a PCN reaction occurring within the last 10 years:No If all of the above answers are "NO", then may proceed with Cephalosporin use.     Antimicrobials this admission: Zosyn  3/29>>3/31 Flagyl 3/31 >> Ampicillin PTA >> 3/29; 3/31 >> 4/4(to finish enterococcus bacteremia)  Dose adjustments this admission: n/a  Microbiology results: 2/20 Blood - enterococcus 3/31 Cdiff toxin, Ag positive  Thank you for allowing pharmacy to be a part of this patient's care.  Sloan Leiter, PharmD, BCPS Clinical Pharmacist [phone] 04/22/2015 3:15 PM

## 2015-04-22 NOTE — Procedures (Signed)
Successful 12 and 3mm PTA of the central Rt innominate severe venous stenosis No comp Stable AVG ready for use Full report in PACS

## 2015-04-22 NOTE — Consult Note (Signed)
PULMONARY / CRITICAL CARE MEDICINE   Name: Debbie [name] MRN: [id number] DOB: 1950-02-08    ADMISSION DATE:  04/01/2015 CONSULTATION DATE:  04/20/15  REFERRING MD:  Deterding  CHIEF COMPLAINT:  Multi factorial Respiratory Failure/ Renal Failure: Need for CVVHD access and CVVHD, transfer to ICU / CCM to assume care until stabalized. Pt. Is a DNR.  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 She has subsequently developed acute on chronic stage 4 renal failure, acute on chronic combined systolic and diastolic  CHF ( BNP [id number] edema evolving to respiratory failure requiring BiPAP. She is a DNR and does not wish to be intubated.She is hypotensive and therefore CVVH is the best option to gently diurese her to improve respiratory status.CCM has been consulted and will assume care while in the ICU for CVVHD treatment and until patient is stabalized.  SUBJECTIVE:  Improved resp status cdiff pos, iv flagyl started cvvhd on going  VITAL SIGNS: BP 56/48 mmHg  Pulse 88  Temp(Src) 97.6 F (36.4 C) (Oral)  Resp 29  Ht 5\' 1"  (1.549 m)  Wt 66.9 kg (147 lb 7.8 oz)  BMI 27.88 kg/m2  SpO2 100%  LMP  (LMP Unknown)  HEMODYNAMICS:  Not requiring pressors at present, but soft BP.  VENTILATOR SETTINGS: Vent Mode:  [-]  FiO2 (%):  [50 %] 50 %  INTAKE / OUTPUT: I/O last 3 completed shifts: In: [id number] [P.O.:180; I.V.:622; IV Piggyback:1025] Out: 7920 [Urine:297; Other:7623]  PHYSICAL EXAMINATION: General: BIPAP no distress Neuro:  Alert and oriented x 2, convinced HEENT: jvd noted Cardiovascular: RRR, no S3, S4, rubs or  murmurs Lungs: improevd ronchi Abdomen:  Soft, non-tender, non-distended, BS + x 4 quads Extremities:No clubbing/ cyanosis, 3+ pitting edema R>L upper and lower extremities, Left arm beneath PICC with darkened skin. Skin:intact  LABS:  BMET  Recent Labs Lab 04/21/15 0350 04/21/15 1515 04/22/15 0357  NA 141 140 138  K 3.8 4.0 3.8  CL 105 105 104  CO2 23 22 22   BUN 34* 20 14  CREATININE 2.09* 1.37* 1.05*  GLUCOSE 121* 130* 119*    Electrolytes  Recent Labs Lab 03/30/2015 1751  04/21/15 0350 04/21/15 1515 04/22/15 0357  CALCIUM  --   < > 8.9 8.8* 8.8*  MG 1.9  --  2.3  --  2.4  PHOS 4.1  < > 3.0 2.6 2.1*  < > = values in this interval not displayed.  CBC  Recent Labs Lab 04/19/15 0359 04/20/15 0310 04/22/15 0357  WBC 11.5* 17.7* 15.5*  HGB 7.6* 7.7* 9.3*  HCT 24.1* 24.2* 29.9*  PLT 182 203 187    Coag's  Recent Labs Lab 04/19/15 0359 04/20/15 0310 04/21/15 0350 04/22/15 0357  APTT 60*  --   --   --   INR 3.09* 3.47* 1.61* 1.72*    Sepsis Markers  Recent Labs Lab 04/20/15 1347 04/20/15 1359 04/21/15 0350 04/22/15 0357  LATICACIDVEN 1.3  --   --   --   PROCALCITON  --  3.16 2.32 2.24    ABG No results for input(s): PHART, PCO2ART, PO2ART in the last 168 hours.  Liver Enzymes  Recent  Labs Lab 04/19/15 0359  04/21/15 0350 04/21/15 1515 04/22/15 0357  AST 33  --   --   --   --   ALT 12*  --   --   --   --   ALKPHOS 63  --   --   --   --   BILITOT 0.9  --   --   --   --   ALBUMIN 1.9*  < > 2.0* 2.0* 2.0*  < > = values in this interval not displayed.  Cardiac Enzymes  Recent Labs Lab 04/08/2015 1751  TROPONINI 0.95*    Glucose  Recent Labs Lab 04/20/15 1052 04/21/15 0319  GLUCAP 128* 120*    Imaging Dg Chest Port 1 View  04/22/2015  CLINICAL DATA:  Pulmonary edema. EXAM: PORTABLE CHEST 1 VIEW COMPARISON:  09/21/2015. FINDINGS: Left PICC line noted, its tip has been repositioned and its tip is over the superior vena cava.  Bilateral airspace disease. Heart size normal. No pleural effusion pneumothorax. IMPRESSION: 1. Left PICC line is been repositioned, its tip is now over the superior vena cava. 2. Bilateral airspace disease.  No interim change. Electronically Signed   By: Marcello Moores  Register   On: 04/22/2015 07:19   Dg Chest Port 1 View  04/21/2015  CLINICAL DATA:  PICC placement. EXAM: PORTABLE CHEST 1 VIEW COMPARISON:  04/21/2015 at 11:08 a.m. FINDINGS: PICC is stable. The tip extends cephalad into the right brachiocephalic vein from the left brachiocephalic vein. Bilateral airspace lung opacities are without change. No pneumothorax. IMPRESSION: 1. Stable appearance of the left PICC when compared to the earlier exam. Tip projects in the right brachiocephalic vein. 2. No change in the appearance of the lungs with bilateral airspace opacities again noted. Electronically Signed   By: Lajean Manes M.D.   On: 04/21/2015 15:04     STUDIES:  03/16/15:  Echo: EF: 55-60% Moderate pulmonary HTN:PA peak pressure: 64 mm Hg (S). Mild MV stenosis Pending repeat Echo done 04/19/15  03/22/15 TEE No evidence vegetation  Echo 3/29 echo>>>25% to 30%. Doppler parameters are  consistent with both elevated ventricular end-diastolic filling  pressure and elevated left atrial filling pressure, PA 72  04/05/2015: CXR Significant worsening of severe fluffy parahilar opacities throughout both lungs, favor worsening severe pulmonary edema / ARDS.  Stable trace bilateral pleural effusions.   CULTURES: No Pending Cultures  ANTIBIOTICS: Ampicillin 04/05/2015>>>  SIGNIFICANT EVENTS: 04/16/2015 BiPAP 3/30- distress off BIPAP 30 min   LINES/TUBES: L PICC Line placed prior hospitalization 04/20/15: R Femoral HD cath  DISCUSSION: Multifactorial Respiratory Failure in setting of suspected NSTEMI complicated by acute on chronic renal failure and combined acute on chronicCHF / Anemia. Need for HD cath placement and CVVHD/ transfer to ICU  and CVVHD to address volume overload. Long discussion with patient and family re: Change in code status to LCB to allow for pressors for hypotension with CVVHD. No intubation, No CPR, no defib, but pressors for BP and chemical code only.  ASSESSMENT / PLAN:  PULMONARY  A: Respiratory Failure /BiPAP dependent with short breaks Volume overload 2/2 RF and CHF Last CXR 03/26/2015: Pulmonary edema/ARDS  P:  Continued volume removal getting 150 off per day Doppler neg arm BIPAP goal to  Maintain off, prn  Follow pcxr further  CARDIOVASCULAR  A: NSTEMI ( mildly elevated troponin) Not a cath candidate per cards until HD and correction of anemia Hypotension History PAF Volume overload Acute on chronic combined systolic/diastolic HF EKG no acute changes Anemia  P:  Pressors for MAP of 60 Medical management per cards with ASA, statin,BB and Imdur - per cards  RENAL A:  Acute on Chronic stage 4 RF with renal transplant/ worsening creatinine Successful neg 2.5 liters  P:   Lasix cvvhd per renal Neg balance obtained, maintained  GASTROINTESTINAL A:  No inidcation gastric Supp, cdiff, vanc allergy P:   Heart smart diet - renal, not taking in much May need NGt and feeds Calorie count flagyl  HEMATOLOGIC A:   Anti-Phospholipid antibody syndrome/ History DVT's Elevated INR ( 3.47) Anemia No evidence of bleeding at present fib P:  heparin drip (dvt h/o, fib) maintain  INFECTIOUS A:   Chronically immunosuppressed patient Afebrile Hypotensive No Cultures pending Recent enterococcus bacteremia NO HCAP noted P:   Zosyn dc in setting unclear hcap, low pct lagyl  ENDOCRINE A: Hypotensive      Adrenal Suppression in a pt on chronic steroids    P:   Monitor CBG's  AC HS Hydrocortisone lower to similar to home dose  NEUROLOGIC A:   Delirium  P:   RASS goal: 0 Avoid benzo  FAMILY  Updated by me and sister  - Inter-disciplinary family meet or Palliative Care  meeting due by:  04/28/15  Ccm time 30 min   Lavon Paganini. Titus Mould, MD, [place] Pgr: [facility]

## 2015-04-22 NOTE — Progress Notes (Signed)
Subjective: Interval History: has no complaint, doing some weaning.  Agrees to shuntogram..  Objective: Vital signs in last 24 hours: Temp:  [96.5 F (35.8 C)-97.6 F (36.4 C)] 96.5 F (35.8 C) (03/31 0355) Pulse Rate:  [74-114] 84 (03/31 0600) Resp:  [14-31] 24 (03/31 0600) BP: (61-108)/(43-78) 92/68 mmHg (03/31 0600) SpO2:  [91 %-100 %] 100 % (03/31 0600) Arterial Line BP: (108-156)/(40-74) 112/43 mmHg (03/31 0600) FiO2 (%):  [50 %] 50 % (03/31 0400) Weight:  [66.9 kg (147 lb 7.8 oz)] 66.9 kg (147 lb 7.8 oz) (03/31 0300) Weight change: -3.4 kg (-7 lb 7.9 oz)  Intake/Output from previous day: 03/30 0701 - 03/31 0700 In: 1305 [P.O.:120; I.V.:392; IV Piggyback:793] Out: [id number] [Urine:92] Intake/Output this shift:    General appearance: cooperative and slowed mentation Resp: rales bibasilar Cardio: S1, S2 normal and systolic murmur: holosystolic 2/6, blowing at apex GI: pos bs, soft Extremities: edema 2+  Lab Results:  Recent Labs  04/20/15 0310 04/22/15 0357  WBC 17.7* 15.5*  HGB 7.7* 9.3*  HCT 24.2* 29.9*  PLT 203 187   BMET:  Recent Labs  04/21/15 1515 04/22/15 0357  NA 140 138  K 4.0 3.8  CL 105 104  CO2 22 22  GLUCOSE 130* 119*  BUN 20 14  CREATININE 1.37* 1.05*  CALCIUM 8.8* 8.8*   No results for input(s): PTH in the last 72 hours. Iron Studies: No results for input(s): IRON, TIBC, TRANSFERRIN, FERRITIN in the last 72 hours.  Studies/Results: Dg Chest Port 1 View  04/21/2015  CLINICAL DATA:  PICC placement. EXAM: PORTABLE CHEST 1 VIEW COMPARISON:  04/21/2015 at 11:08 a.m. FINDINGS: PICC is stable. The tip extends cephalad into the right brachiocephalic vein from the left brachiocephalic vein. Bilateral airspace lung opacities are without change. No pneumothorax. IMPRESSION: 1. Stable appearance of the left PICC when compared to the earlier exam. Tip projects in the right brachiocephalic vein. 2. No change in the appearance of the lungs with bilateral  airspace opacities again noted. Electronically Signed   By: Lajean Manes M.D.   On: 04/21/2015 15:04   Dg Chest Port 1 View  04/21/2015  CLINICAL DATA:  Evaluate PICC line placement. EXAM: PORTABLE CHEST 1 VIEW COMPARISON:  Chest radiograph 04/20/2015. FINDINGS: Multiple monitoring leads overlie the patient. Unchanged position left upper extremity PICC line with tip projecting in the expected location of the central right brachiocephalic vein. Stable cardiac and mediastinal contours. Unchanged diffuse bilateral airspace opacities. Low lung volumes. Small bilateral pleural effusions. No pneumothorax. IMPRESSION: Unchanged position left upper extremity PICC line with tip projecting over the expected location of the central right brachiocephalic vein. Recommend repositioning. Unchanged diffuse bilateral airspace opacities and small bilateral pleural effusions. These results will be called to the ordering clinician or representative by the Radiologist Assistant, and communication documented in the PACS or zVision Dashboard. Electronically Signed   By: Lovey Newcomer M.D.   On: 04/21/2015 12:27   Dg Chest Port 1 View  04/20/2015  CLINICAL DATA:  Shortness of Breath EXAM: PORTABLE CHEST 1 VIEW COMPARISON:  04/07/2015 FINDINGS: Cardiac shadow is stable. Persistent bilateral infiltrative change is seen and slightly increased from that noted on the prior exam. The left-sided PICC line is again identified with tip now lies within the right innominate vein. No bony abnormality is seen. IMPRESSION: Slight increase in bilateral infiltrates particularly in the right upper lobe. PICC line as described in the right innominate vein. Electronically Signed   By: Linus Mako.D.  On: 04/20/2015 12:38    I have reviewed the patient's current medications.  Assessment/Plan: 1 AKI.CKD4  Doing well with vol off.  Still xs.  Acid/base/K ok.  Phos low, repleting.  Solute improving.No function in [place].   Will see if can get  shuntogram and see if any poss of opening AVG. 2 Renal TX Prograf ok.  Will cont Lasix another 24 h. 3 C diff on Flagyl 4 Nutrition hopefully get pos 5 antiphos lipid on anticoag 6 Gout stable 7 DJD 8 PAF 9 GIB 10 Anemia esa/Fe P CRRT, shuntogram, esa, Flagyl  ?? Need for Zozyn    LOS: 4 days   Keanan Melander L 04/22/2015,7:01 AM

## 2015-04-22 NOTE — Progress Notes (Signed)
Patient is stable and without respiratory distress at this time on 4L nasal cannula. BiPAP not placed on at this time. RT will continue to monitor.

## 2015-04-22 NOTE — Progress Notes (Signed)
Initial Nutrition Assessment  DOCUMENTATION CODES:   Not applicable  INTERVENTION:  -Calorie Count for 72 hours  NUTRITION DIAGNOSIS:   Inadequate oral intake related to poor appetite as evidenced by  (RN report).  GOAL:   Patient will meet greater than or equal to 90% of their needs  MONITOR:   PO intake, Supplement acceptance, Labs, Weight trends  REASON FOR ASSESSMENT:   Low Braden    ASSESSMENT:   Pt with PMH of anti-phospholipid antibody syndrome/ history of DVT's, PAF, chronic systolic CHF, ESRD (s/p transplant in 2010, now on chronic immunosuppression), HTN, HLD, severe pulmonary HTN, and recent NSTEMI in 01/2017who presents to [facility] ED on 04/17/2015 for evaluation of chest pain. She has subsequently developed acute on chronic stage 4 renal failure, acute on chronic combined systolic and diastolic CHF,pulmonary edema evolving to respiratory failure requiring BiPAP. She is a DNR and does not wish to be intubated.She is hypotensive and therefore CVVH is the best option to gently diurese her    Discussed pt with RN. RN request pt not to be woken up. RN reports pt is not eating adequately and cognitively impaired.   Per chart pt weight trending down, -13 lb. Per intake/output pt is -8171 ml. Suspect this is contributing to weight loss.   Unable to conducted nutrition focused physical exam.   Medications reviewed; potassium phosphate 30 mmol Labs reviewed; phosphorus (2.1) low  Diet Order:  Diet renal/carb modified with fluid restriction Diet-HS Snack?: Nothing; Room service appropriate?: Yes; Fluid consistency:: Thin  Skin:  Wound (see comment) (NOT Pressure injuries, wound to coccyx and buttocks )  Last BM:  04/21/2015  Height:   Ht Readings from Last 1 Encounters:  04/22/15 5\' 1"  (1.549 m)    Weight:   Wt Readings from Last 1 Encounters:  04/22/15 147 lb 7.8 oz (66.9 kg)    Ideal Body Weight:  47.7 kg  BMI:  Body mass index is 27.88  kg/(m^2).  Estimated Nutritional Needs:   Kcal:  1700-1900 (25 kcal/kg)  Protein:  100-110 grams (1.5 g/kg)  Fluid:  Per MD  EDUCATION NEEDS:   No education needs identified at this time  Raford Pitcher, Dietetic Intern Pager: [phone]   I agree with student dietitian note; appropriate revisions have been made.  Molli Barrows, RD, LDN, [place] Pager# [phone] After Hours Pager# [phone]

## 2015-04-22 NOTE — Progress Notes (Signed)
[facility] Progress Note Patient Name: Debbie [name] DOB: 06-Jul-1950 MRN: [id number]   Date of Service  04/22/2015  HPI/Events of Note  Hypophosphatemia and potassium of 3.8  eICU Interventions  Phos and potassium replaced     Intervention Category Intermediate Interventions: Electrolyte abnormality - evaluation and management  DETERDING,ELIZABETH 04/22/2015, 4:57 AM

## 2015-04-22 NOTE — Progress Notes (Signed)
[address] for heparin Indication: atrial fibrillation  Allergies  Allergen Reactions  . Feraheme [Ferumoxytol] Other (See Comments)    Chest pain, pulsating 02/17/15: tolerated Nulecit  . Vancomycin Anaphylaxis, Itching, Swelling and Other (See Comments)    Tongue swell  . Dorzolamide Hcl-Timolol Mal Rash and Other (See Comments)    Eye pain  . Gentamycin [Gentamicin Sulfate] Itching and Swelling  . Latanoprost Rash and Other (See Comments)    Eye pain  . Cefazolin Itching  . Codeine Itching  . Hydrocodone-Acetaminophen Itching  . Penicillins Itching    *tolerated ampicillin during 02/2015 admission Has patient had a PCN reaction causing immediate rash, facial/tongue/throat swelling, SOB or lightheadedness with hypotension:No Has patient had a PCN reaction causing severe rash involving mucus membranes or skin necrosis:No Has patient had a PCN reaction that required hospitalization:No Has patient had a PCN reaction occurring within the last 10 years:No If all of the above answers are "NO", then may proceed with Cephalosporin use.     Patient Measurements: Height: 5\' 1"  (154.9 cm) Weight: 147 lb 7.8 oz (66.9 kg) IBW/kg (Calculated) : 47.8 Heparin Dosing Weight: 63kg   Vital Signs: Temp: 97.5 F (36.4 C) (03/31 1230) Temp Source: Oral (03/31 1230) BP: 56/48 mmHg (03/31 0751) Pulse Rate: 88 (03/31 0751)  Labs:  Recent Labs  04/20/15 0310  04/21/15 0350 04/21/15 1515 04/21/15 2115 04/22/15 0357  HGB 7.7*  --   --   --   --  9.3*  HCT 24.2*  --   --   --   --  29.9*  PLT 203  --   --   --   --  187  LABPROT 34.1*  --  19.2*  --   --  20.1*  INR 3.47*  --  1.61*  --   --  1.72*  HEPARINUNFRC  --   --   --   --  0.55 0.61  CREATININE 3.48*  < > 2.09* 1.37*  --  1.05*  < > = values in this interval not displayed.  Estimated Creatinine Clearance: 46.7 mL/min (by C-G formula based on Cr of 1.05).   Medical History: Past Medical  History  Diagnosis Date  . Kidney transplant as cause of abnormal reaction or later complication   . Hypertension   . Colon polyps   . Gall stones 1980  . Anti-phospholipid antibody syndrome [facility])     (Based on hospital discharge summary march 2013)  . Paroxysmal atrial fibrillation (HCC)   . Carpal tunnel syndrome 2003    lt  . Esophageal stricture   . Hemorrhoids   . Renal disorder   . ESRD (end stage renal disease) on dialysis [facility])     "til I got my transplant in 2010"  . DVT (deep venous thrombosis) (HCC) 1990    LLE  . History of blood transfusion     "related to the lupus"  . Dyslipidemia 07/22/2013  . Coronary artery disease   . Heart murmur     mild MR on echo  . Pneumonia     "3 times now" (08/25/2013)  . GERD (gastroesophageal reflux disease)   . Arthritis     "fingers" (08/25/2013)  . Glaucoma, left eye   . Bilateral carotid artery stenosis     1-39%  . Pulmonary HTN (HCC)     PASP 20mmHg    Medications:  Infusions:  . sodium chloride    . heparin 900 Units/hr (04/21/15 2000)  .  nitroGLYCERIN    . dialysis replacement fluid (prismasate) 700 mL/hr at 04/22/15 1010  . dialysis replacement fluid (prismasate) 700 mL/hr at 04/22/15 1010  . dialysate (PRISMASATE) 2,000 mL/hr at 04/22/15 [id number]    Assessment: 66 yof presented to the hospital with CP. She is on chronic coumadin for hx afib, DVT and anti-phospholipid syndrome. Coumadin was reversed and INR is now subtherapeutic.   Heparin IV on board. Heparin level therapeutic x2 on 900 units/hr.  No bleeding documented. H/H up, platelets stable.   Goal of Therapy:  Heparin level 0.3-0.7 units/ml Monitor platelets by anticoagulation protocol: Yes   Plan:  - Continue Heparin gtt 900 units/hr - Daily heparin level and CBC  Sloan Leiter, PharmD, BCPS Clinical Pharmacist [phone] 04/22/2015 1:05 PM

## 2015-04-23 ENCOUNTER — Inpatient Hospital Stay (HOSPITAL_COMMUNITY): Payer: Medicare Other

## 2015-04-23 DIAGNOSIS — I9589 Other hypotension: Secondary | ICD-10-CM

## 2015-04-23 LAB — CBC
HCT: 29.1 % — ABNORMAL LOW (ref 36.0–46.0)
Hemoglobin: 9.1 g/dL — ABNORMAL LOW (ref 12.0–15.0)
MCH: 28.2 pg (ref 26.0–34.0)
MCHC: 31.3 g/dL (ref 30.0–36.0)
MCV: 90.1 fL (ref 78.0–100.0)
PLATELETS: 174 10*3/uL (ref 150–400)
RBC: 3.23 MIL/uL — AB (ref 3.87–5.11)
RDW: 18.4 % — AB (ref 11.5–15.5)
WBC: 12.6 10*3/uL — ABNORMAL HIGH (ref 4.0–10.5)

## 2015-04-23 LAB — RENAL FUNCTION PANEL
ANION GAP: 12 (ref 5–15)
Albumin: 1.9 g/dL — ABNORMAL LOW (ref 3.5–5.0)
Albumin: 1.9 g/dL — ABNORMAL LOW (ref 3.5–5.0)
Anion gap: 11 (ref 5–15)
BUN: 12 mg/dL (ref 6–20)
BUN: 9 mg/dL (ref 6–20)
CALCIUM: 8.9 mg/dL (ref 8.9–10.3)
CALCIUM: 8.5 mg/dL — AB (ref 8.9–10.3)
CO2: 23 mmol/L (ref 22–32)
CO2: 20 mmol/L — AB (ref 22–32)
CREATININE: 1.06 mg/dL — AB (ref 0.44–1.00)
Chloride: 105 mmol/L (ref 101–111)
Chloride: 106 mmol/L (ref 101–111)
Creatinine, Ser: 1 mg/dL (ref 0.44–1.00)
GFR calc Af Amer: >60 mL/min (ref >60)
GFR calc non Af Amer: 54 mL/min — ABNORMAL LOW (ref >60)
GFR calc non Af Amer: 58 mL/min — ABNORMAL LOW (ref >60)
GLUCOSE: 114 mg/dL — AB (ref 65–99)
Glucose, Bld: 163 mg/dL — ABNORMAL HIGH (ref 65–99)
Phosphorus: 2.8 mg/dL (ref 2.5–4.6)
Phosphorus: 2.5 mg/dL (ref 2.5–4.6)
Potassium: 3.7 mmol/L (ref 3.5–5.1)
Potassium: 4.1 mmol/L (ref 3.5–5.1)
SODIUM: 139 mmol/L (ref 135–145)
SODIUM: 138 mmol/L (ref 135–145)

## 2015-04-23 LAB — HEPARIN LEVEL (UNFRACTIONATED)
HEPARIN UNFRACTIONATED: 0.87 [IU]/mL — AB (ref 0.30–0.70)
Heparin Unfractionated: 0.89 IU/mL — ABNORMAL HIGH (ref 0.30–0.70)

## 2015-04-23 LAB — PROTIME-INR
INR: 1.64 — ABNORMAL HIGH (ref 0.00–1.49)
Prothrombin Time: 19.5 seconds — ABNORMAL HIGH (ref 11.6–15.2)

## 2015-04-23 LAB — MAGNESIUM: Magnesium: 2.4 mg/dL (ref 1.7–2.4)

## 2015-04-23 NOTE — Progress Notes (Signed)
Subjective: Interval History: has no complaint .  Breathing better.  Objective: Vital signs in last 24 hours: Temp:  [97.3 F (36.3 C)-98.3 F (36.8 C)] 98.3 F (36.8 C) (04/01 0000) Pulse Rate:  [79-100] 91 (04/01 0700) Resp:  [17-32] 23 (04/01 0700) BP: (56-128)/(48-55) 128/55 mmHg (03/31 2218) SpO2:  [95 %-100 %] 96 % (04/01 0700) Arterial Line BP: (106-135)/(40-60) 118/46 mmHg (04/01 0700) Weight:  [63 kg (138 lb 14.2 oz)-66.9 kg (147 lb 7.8 oz)] 63 kg (138 lb 14.2 oz) (04/01 0600) Weight change: 0 kg (0 lb)  Intake/Output from previous day: 03/31 0701 - 04/01 0700 In: 1705.1 [I.V.:522.1; IV Piggyback:1183] Out: [id number] [Urine:25] Intake/Output this shift:    General appearance: cooperative and slowed mentation Resp: diminished breath sounds bilaterally and rales bibasilar Cardio: S1, S2 normal and systolic murmur: holosystolic 2/6, blowing at apex GI: soft, liver down 6 cm Extremities: edema 3+ and R arm swelling, pos b&T, R fem cath  Lab Results:  Recent Labs  04/22/15 0357 04/23/15 0550  WBC 15.5* PENDING  HGB 9.3* 9.1*  HCT 29.9* 29.1*  PLT 187 174   BMET:  Recent Labs  04/22/15 1400 04/23/15 0550  NA 139 139  K 4.2 3.7  CL 105 105  CO2 22 23  GLUCOSE 146* 114*  BUN 11 12  CREATININE 0.90 1.06*  CALCIUM 8.7* 8.9   No results for input(s): PTH in the last 72 hours. Iron Studies: No results for input(s): IRON, TIBC, TRANSFERRIN, FERRITIN in the last 72 hours.  Studies/Results: Ir US Guide Vasc Access Right  04/22/2015  INDICATION: Chronic right upper extremity AV graft, end-stage renal disease, right upper extremity swelling EXAM: ULTRASOUND GUIDANCE FOR VASCULAR ACCESS RIGHT UPPER EXTREMITY AV GRAFT SHUNTOGRAM WITH 12 AND 14 MM RIGHT CENTRAL INNOMINATE VENOUS ANGIOPLASTY MEDICATIONS: 1% LIDOCAINE LOCALLY ANESTHESIA/SEDATION: Moderate Sedation Time:  None. The patient was continuously monitored during the procedure by the interventional radiology nurse  under my direct supervision. FLUOROSCOPY TIME:  Fluoroscopy Time: 4 minutes 36 seconds (35 mGy). COMPLICATIONS: None immediate. PROCEDURE: Informed written consent was obtained from the patient after a thorough discussion of the procedural risks, benefits and alternatives. All questions were addressed. Maximal Sterile Barrier Technique was utilized including caps, mask, sterile gowns, sterile gloves, sterile drape, hand hygiene and skin antiseptic. A timeout was performed prior to the initiation of the procedure. Under sterile conditions and local anesthesia, ultrasound micropuncture access performed of the right upper arm AV graft close to the arterial anastomosis. Contrast injection performed for a complete shuntogram from the anastomosis to the right atrium. Right upper arm shuntogram: Arterial anastomosis to the brachial artery at the elbow is patent. 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 No occlusion. Venous anastomosis to the high brachial vein is patent. Axilla and subclavian veins are patent centrally. Focal severe stenosis noted of the central right innominate vein with jugular and mediastinal collaterals. Collaterals extend across the chest to the left innominate vein. SVC is visualized and patent. Central right innominate venous angioplasty: Also under sterile conditions and local anesthesia, a guidewire was advanced followed by insertion of a 7 [place] sheath. Kumpe catheter and Glidewire were manipulated through the central right innominate venous stenosis. Amplatz guidewire exchange performed positioned in the IVC. Localization venogram performed. Initially 12 mm overlapping angioplasty performed of the central right innominate venous stenosis for approximately 60 seconds to 20 atmospheres. Balloon was deflated and removed. Repeat venogram demonstrates residual right innominate venous stenosis  estimated at greater than 50%.  There is less jugular and mediastinal collateral flow. Next, the balloon was upsized to a 14 mm balloon. Overlapping prolonged 14 mm angioplasty performed with a 14 x 4 atlas balloon. Final central venogram demonstrates improved central venous outflow with a residual right innominate venous stenosis estimated at approximately 40- 50%. Jugular and mediastinal collaterals no longer fill. No complication. Access removed. Hemostasis obtained with manual compression. 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 This responded to serial 12 and 14 mm venous angioplasty as above. ACCESS: This access remains amenable to future percutaneous interventions as clinically indicated. Electronically Signed   By: Jerilynn Mages.  Shick M.D.   On: 04/22/2015 16:12   Dg Chest Port 1 View  04/22/2015  CLINICAL DATA:  Pulmonary edema. EXAM: PORTABLE CHEST 1 VIEW COMPARISON:  09/21/2015. FINDINGS: Left PICC line noted, its tip has been repositioned and its tip is over the superior vena cava. Bilateral airspace disease. Heart size normal. No pleural effusion pneumothorax. IMPRESSION: 1. Left PICC line is been repositioned, its tip is now over the superior vena cava. 2. Bilateral airspace disease.  No interim change. Electronically Signed   By: Marcello Moores  Register   On: 04/22/2015 07:19   Dg Chest Port 1 View  04/21/2015  CLINICAL DATA:  PICC placement. EXAM: PORTABLE CHEST 1 VIEW COMPARISON:  04/21/2015 at 11:08 a.m. FINDINGS: PICC is stable. The tip extends cephalad into the right brachiocephalic vein from the left brachiocephalic vein. Bilateral airspace lung opacities are without change. No pneumothorax. IMPRESSION: 1. Stable appearance of the left PICC when compared to the earlier exam. Tip projects in the right brachiocephalic vein. 2. No change in the appearance of the lungs with bilateral airspace opacities again noted.  Electronically Signed   By: Lajean Manes M.D.   On: 04/21/2015 15:04   Dg Chest Port 1 View  04/21/2015  CLINICAL DATA:  Evaluate PICC line placement. EXAM: PORTABLE CHEST 1 VIEW COMPARISON:  Chest radiograph 04/20/2015. FINDINGS: Multiple monitoring leads overlie the patient. Unchanged position left upper extremity PICC line with tip projecting in the expected location of the central right brachiocephalic vein. Stable cardiac and mediastinal contours. Unchanged diffuse bilateral airspace opacities. Low lung volumes. Small bilateral pleural effusions. No pneumothorax. IMPRESSION: Unchanged position left upper extremity PICC line with tip projecting over the expected location of the central right brachiocephalic vein. Recommend repositioning. Unchanged diffuse bilateral airspace opacities and small bilateral pleural effusions. These results will be called to the ordering clinician or representative by the Radiologist Assistant, and communication documented in the PACS or zVision Dashboard. Electronically Signed   By: Lovey Newcomer M.D.   On: 04/21/2015 12:27   Ir Av Dialy Shunt Intro Needle/intrac Initial W/pta/stent/img Right  04/22/2015  INDICATION: Chronic right upper extremity AV graft, end-stage renal disease, right upper extremity swelling EXAM: ULTRASOUND GUIDANCE FOR VASCULAR ACCESS RIGHT UPPER EXTREMITY AV GRAFT SHUNTOGRAM WITH 12 AND 14 MM RIGHT CENTRAL INNOMINATE VENOUS ANGIOPLASTY MEDICATIONS: 1% LIDOCAINE LOCALLY ANESTHESIA/SEDATION: Moderate Sedation Time:  None. The patient was continuously monitored during the procedure by the interventional radiology nurse under my direct supervision. FLUOROSCOPY TIME:  Fluoroscopy Time: 4 minutes 36 seconds (35 mGy). COMPLICATIONS: None immediate. PROCEDURE: Informed written consent was obtained from the patient after a thorough discussion of the procedural risks, benefits and alternatives. All questions were addressed. Maximal Sterile Barrier Technique was  utilized including caps, mask, sterile gowns, sterile gloves, sterile drape,  hand hygiene and skin antiseptic. A timeout was performed prior to the initiation of the procedure. Under sterile conditions and local anesthesia, ultrasound micropuncture access performed of the right upper arm AV graft close to the arterial anastomosis. Contrast injection performed for a complete shuntogram from the anastomosis to the right atrium. Right upper arm shuntogram: Arterial anastomosis to the brachial artery at the elbow is patent. 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 No occlusion. Venous anastomosis to the high brachial vein is patent. Axilla and subclavian veins are patent centrally. Focal severe stenosis noted of the central right innominate vein with jugular and mediastinal collaterals. Collaterals extend across the chest to the left innominate vein. SVC is visualized and patent. Central right innominate venous angioplasty: Also under sterile conditions and local anesthesia, a guidewire was advanced followed by insertion of a 7 [place] sheath. Kumpe catheter and Glidewire were manipulated through the central right innominate venous stenosis. Amplatz guidewire exchange performed positioned in the IVC. Localization venogram performed. Initially 12 mm overlapping angioplasty performed of the central right innominate venous stenosis for approximately 60 seconds to 20 atmospheres. Balloon was deflated and removed. Repeat venogram demonstrates residual right innominate venous stenosis estimated at greater than 50%. There is less jugular and mediastinal collateral flow. Next, the balloon was upsized to a 14 mm balloon. Overlapping prolonged 14 mm angioplasty performed with a 14 x 4 atlas balloon. Final central venogram demonstrates improved central venous outflow with a residual right innominate venous stenosis estimated at approximately 40- 50%. Jugular  and mediastinal collaterals no longer fill. No complication. Access removed. Hemostasis obtained with manual compression. 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 This responded to serial 12 and 14 mm venous angioplasty as above. ACCESS: This access remains amenable to future percutaneous interventions as clinically indicated. Electronically Signed   By: Jerilynn Mages.  Shick M.D.   On: 04/22/2015 16:12    I have reviewed the patient's current medications.  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  Good solute/acid/base/ K/Phos.  Vol off good.  Will plan to cont removal.  If tol next 48 h,will go to [facility].   Access now patent,  2 R arm swelling , should go down 3 Anemia esa/Fe 4 Renal TX cont meds 5 Antiphos lipid cont anticoag 6 CAD 7 DJD 8 GIB 9 ? pneu P CRRT, esa, po's, vit D,     LOS: 5 days   Mattalyn Anderegg L 04/23/2015,7:16 AM

## 2015-04-23 NOTE — Progress Notes (Signed)
ANTICOAGULATION CONSULT NOTE - Follow Up Consult  Pharmacy Consult for Heparin  Indication: atrial fibrillation/DVT/anti-phospholipid syndrome   Patient Measurements: Height: 5\' 1"  (154.9 cm) Weight: 147 lb 7.8 oz (66.9 kg) IBW/kg (Calculated) : 47.8 Vital Signs: Temp: 98.3 F (36.8 C) (04/01 0000) Temp Source: Axillary (04/01 0000) BP: 128/55 mmHg (03/31 2218) Pulse Rate: 81 (04/01 0600)  Labs:  Recent Labs  04/21/15 0350 04/21/15 1515 04/21/15 2115 04/22/15 0357 04/22/15 1400 04/23/15 0550  HGB  --   --   --  9.3*  --  9.1*  HCT  --   --   --  29.9*  --  29.1*  PLT  --   --   --  187  --  174  LABPROT 19.2*  --   --  20.1*  --  19.5*  INR 1.61*  --   --  1.72*  --  1.64*  HEPARINUNFRC  --   --  0.55 0.61  --  0.89*  CREATININE 2.09* 1.37*  --  1.05* 0.90  --     Estimated Creatinine Clearance: 54.5 mL/min (by C-G formula based on Cr of 0.9).   Assessment: Heparin while warfarin on hold, HL is high this AM, no issues per RN.   Goal of Therapy:  Heparin level 0.3-0.7 units/ml Monitor platelets by anticoagulation protocol: Yes   Plan:  -Decrease heparin to 750 units/hr -1400 HL  Debbie [name] 04/23/2015,6:20 AM

## 2015-04-23 NOTE — Progress Notes (Signed)
[place] for heparin Indication: atrial fibrillation  Allergies  Allergen Reactions  . Feraheme [Ferumoxytol] Other (See Comments)    Chest pain, pulsating 02/17/15: tolerated Nulecit  . Vancomycin Anaphylaxis, Itching, Swelling and Other (See Comments)    Tongue swell  . Dorzolamide Hcl-Timolol Mal Rash and Other (See Comments)    Eye pain  . Gentamycin [Gentamicin Sulfate] Itching and Swelling  . Latanoprost Rash and Other (See Comments)    Eye pain  . Cefazolin Itching  . Codeine Itching  . Hydrocodone-Acetaminophen Itching  . Penicillins Itching    *tolerated ampicillin during 02/2015 admission Has patient had a PCN reaction causing immediate rash, facial/tongue/throat swelling, SOB or lightheadedness with hypotension:No Has patient had a PCN reaction causing severe rash involving mucus membranes or skin necrosis:No Has patient had a PCN reaction that required hospitalization:No Has patient had a PCN reaction occurring within the last 10 years:No If all of the above answers are "NO", then may proceed with Cephalosporin use.     Patient Measurements: Height: 5\' 1"  (154.9 cm) Weight: 138 lb 14.2 oz (63 kg) IBW/kg (Calculated) : 47.8 Heparin Dosing Weight: 63kg   Vital Signs: Temp: 97.3 F (36.3 C) (04/01 0847) Temp Source: Axillary (04/01 0847) BP: 121/46 mmHg (04/01 1014) Pulse Rate: 82 (04/01 1200)  Labs:  Recent Labs  04/21/15 0350  04/22/15 0357 04/22/15 1400 04/23/15 0550 04/23/15 1400  HGB  --   --  9.3*  --  9.1*  --   HCT  --   --  29.9*  --  29.1*  --   PLT  --   --  187  --  174  --   LABPROT 19.2*  --  20.1*  --  19.5*  --   INR 1.61*  --  1.72*  --  1.64*  --   HEPARINUNFRC  --   < > 0.61  --  0.89* 0.87*  CREATININE 2.09*  < > 1.05* 0.90 1.06*  --   < > = values in this interval not displayed.  Estimated Creatinine Clearance: 45 mL/min (by C-G formula based on Cr of 1.06).   Medications:  Infusions:   . sodium chloride 20 mL/hr at 04/22/15 2000  . heparin 750 Units/hr (04/23/15 [id number])  . nitroGLYCERIN    . dialysis replacement fluid (prismasate) 700 mL/hr at 04/23/15 1420  . dialysis replacement fluid (prismasate) 700 mL/hr at 04/23/15 1424  . dialysate (PRISMASATE) 2,000 mL/hr at 04/23/15 1407    Assessment: 34 yof presented to the hospital with CP. She is on chronic coumadin for hx afib, DVT and anti-phospholipid syndrome. Coumadin was reversed and INR is now subtherapeutic so she was initiated on IV heparin. Heparin level remains high after a rate reduction this AM. No bleeding noted.   Goal of Therapy:  Heparin level 0.3-0.7 units/ml Monitor platelets by anticoagulation protocol: Yes   Plan:  - Decrease heparin to 600 units/hr - Check an 8 hour heparin level  Salome Arnt, PharmD, BCPS Pager # [phone] 04/23/2015 3:25 PM

## 2015-04-23 NOTE — Progress Notes (Signed)
PULMONARY / CRITICAL CARE MEDICINE   Name: Debbie [name] MRN: [id number] DOB: 06/02/1950    ADMISSION DATE:  03/23/2015 CONSULTATION DATE:  04/20/15  REFERRING MD:  Deterding  CHIEF COMPLAINT:  Multi factorial Respiratory Failure/ Renal Failure: Need for CVVHD access and CVVHD, transfer to ICU / CCM to assume care until stabalized. Pt. Is a DNR.  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 She has subsequently developed acute on chronic stage 4 renal failure, acute on chronic combined systolic and diastolic  CHF ( BNP [id number] edema evolving to respiratory failure requiring BiPAP. She is a DNR and does not wish to be intubated.She is hypotensive and therefore CVVH is the best option to gently diurese her to improve respiratory status.CCM has been consulted and will assume care while in the ICU for CVVHD treatment and until patient is stabalized.  SUBJECTIVE:   VITAL SIGNS: BP 121/46 mmHg  Pulse 81  Temp(Src) 97.3 F (36.3 C) (Axillary)  Resp 31  Ht 5\' 1"  (1.549 m)  Wt 138 lb 14.2 oz (63 kg)  BMI 26.26 kg/m2  SpO2 97%  LMP  (LMP Unknown)  HEMODYNAMICS:  VENTILATOR SETTINGS:    INTAKE / OUTPUT: I/O last 3 completed shifts: In: 2550.1 [P.O.:120; I.V.:830.1; IV Piggyback:1600] Out: 6579 [Urine:52; [id number]  PHYSICAL EXAMINATION: General: awake, no distress.  Neuro:  Alert and oriented x 3, appropriate, MAE x 4 HEENT: jvd noted; but improved  Cardiovascular: RRR, no S3, S4, rubs or murmurs Lungs: not labored crackles, coarse Abdomen:  Soft, non-tender, non-distended, BS + x 4 quads Extremities:No  clubbing/ cyanosis, 3+ pitting edema R>L upper and lower extremities, Left arm beneath PICC with darkened skin. Skin:intact  LABS:  BMET  Recent Labs Lab 04/22/15 0357 04/22/15 1400 04/23/15 0550  NA 138 139 139  K 3.8 4.2 3.7  CL 104 105 105  CO2 22 22 23   BUN 14 11 12   CREATININE 1.05* 0.90 1.06*  GLUCOSE 119* 146* 114*    Electrolytes  Recent Labs Lab 04/21/15 0350  04/22/15 0357 04/22/15 1400 04/23/15 0550  CALCIUM 8.9  < > 8.8* 8.7* 8.9  MG 2.3  --  2.4  --  2.4  PHOS 3.0  < > 2.1* 3.2 2.8  < > = values in this interval not displayed.  CBC  Recent Labs Lab 04/20/15 0310 04/22/15 0357 04/23/15 0550  WBC 17.7* 15.5* 12.6*  HGB 7.7* 9.3* 9.1*  HCT 24.2* 29.9* 29.1*  PLT 203 187 174    Coag's  Recent Labs Lab 04/19/15 0359  04/21/15 0350 04/22/15 0357 04/23/15 0550  APTT 60*  --   --   --   --   INR 3.09*  < > 1.61* 1.72* 1.64*  < > = values in this interval not displayed.  Sepsis Markers  Recent Labs Lab 04/20/15 1347 04/20/15 1359 04/21/15 0350 04/22/15 0357  LATICACIDVEN 1.3  --   --   --   PROCALCITON  --  3.16 2.32 2.24    ABG No results for input(s): PHART, PCO2ART, PO2ART in the last 168 hours.  Liver Enzymes  Recent Labs Lab 04/19/15 0359  04/22/15 0357 04/22/15  1400 04/23/15 0550  AST 33  --   --   --   --   ALT 12*  --   --   --   --   ALKPHOS 63  --   --   --   --   BILITOT 0.9  --   --   --   --   ALBUMIN 1.9*  < > 2.0* 2.0* 1.9*  < > = values in this interval not displayed.  Cardiac Enzymes  Recent Labs Lab 04/07/2015 1751  TROPONINI 0.95*    Glucose  Recent Labs Lab 04/20/15 1052 04/21/15 0319 04/22/15 1737  GLUCAP 128* 120* 132*    Imaging Ir US Guide Vasc Access Right  04/22/2015  INDICATION: Chronic right upper extremity AV graft, end-stage renal disease, right upper extremity swelling EXAM: ULTRASOUND GUIDANCE FOR VASCULAR ACCESS RIGHT UPPER EXTREMITY AV GRAFT SHUNTOGRAM WITH 12 AND 14 MM  RIGHT CENTRAL INNOMINATE VENOUS ANGIOPLASTY MEDICATIONS: 1% LIDOCAINE LOCALLY ANESTHESIA/SEDATION: Moderate Sedation Time:  None. The patient was continuously monitored during the procedure by the interventional radiology nurse under my direct supervision. FLUOROSCOPY TIME:  Fluoroscopy Time: 4 minutes 36 seconds (35 mGy). COMPLICATIONS: None immediate. PROCEDURE: Informed written consent was obtained from the patient after a thorough discussion of the procedural risks, benefits and alternatives. All questions were addressed. Maximal Sterile Barrier Technique was utilized including caps, mask, sterile gowns, sterile gloves, sterile drape, hand hygiene and skin antiseptic. A timeout was performed prior to the initiation of the procedure. Under sterile conditions and local anesthesia, ultrasound micropuncture access performed of the right upper arm AV graft close to the arterial anastomosis. Contrast injection performed for a complete shuntogram from the anastomosis to the right atrium. Right upper arm shuntogram: Arterial anastomosis to the brachial artery at the elbow is patent. 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 No occlusion. Venous anastomosis to the high brachial vein is patent. Axilla and subclavian veins are patent centrally. Focal severe stenosis noted of the central right innominate vein with jugular and mediastinal collaterals. Collaterals extend across the chest to the left innominate vein. SVC is visualized and patent. Central right innominate venous angioplasty: Also under sterile conditions and local anesthesia, a guidewire was advanced followed by insertion of a 7 [place] sheath. Kumpe catheter and Glidewire were manipulated through the central right innominate venous stenosis. Amplatz guidewire exchange performed positioned in the IVC. Localization venogram performed. Initially 12 mm overlapping angioplasty performed of the  central right innominate venous stenosis for approximately 60 seconds to 20 atmospheres. Balloon was deflated and removed. Repeat venogram demonstrates residual right innominate venous stenosis estimated at greater than 50%. There is less jugular and mediastinal collateral flow. Next, the balloon was upsized to a 14 mm balloon. Overlapping prolonged 14 mm angioplasty performed with a 14 x 4 atlas balloon. Final central venogram demonstrates improved central venous outflow with a residual right innominate venous stenosis estimated at approximately 40- 50%. Jugular and mediastinal collaterals no longer fill. No complication. Access removed. Hemostasis obtained with manual compression. 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 This responded to serial 12 and 14 mm venous angioplasty as above. ACCESS: This access remains amenable to future percutaneous interventions as clinically indicated. Electronically Signed   By: Jerilynn Mages.  Shick M.D.   On: 04/22/2015 16:12   Dg Chest Port 1 View  04/23/2015  CLINICAL DATA:  65 year old female  with acute kidney injury, pulmonary edema, myocardial infarction and cardiomyopathy. EXAM: PORTABLE CHEST 1 VIEW COMPARISON:  04/22/2015 and prior exams FINDINGS: Diffuse bilateral airspace disease/ edema is unchanged. A left-sided PICC line is now noted with tip extending into the right subclavian vein. There is no evidence of pneumothorax or large pleural effusion. No other significant changes identified. IMPRESSION: Left PICC line with tip now extending into the right subclavian vein. Unchanged bilateral airspace disease/ edema. Electronically Signed   By: Margarette Canada M.D.   On: 04/23/2015 09:36   Ir Av Dialy Shunt Intro Needle/intrac Initial W/pta/stent/img Right  04/22/2015  INDICATION: Chronic right upper extremity AV graft, end-stage renal disease, right upper extremity  swelling EXAM: ULTRASOUND GUIDANCE FOR VASCULAR ACCESS RIGHT UPPER EXTREMITY AV GRAFT SHUNTOGRAM WITH 12 AND 14 MM RIGHT CENTRAL INNOMINATE VENOUS ANGIOPLASTY MEDICATIONS: 1% LIDOCAINE LOCALLY ANESTHESIA/SEDATION: Moderate Sedation Time:  None. The patient was continuously monitored during the procedure by the interventional radiology nurse under my direct supervision. FLUOROSCOPY TIME:  Fluoroscopy Time: 4 minutes 36 seconds (35 mGy). COMPLICATIONS: None immediate. PROCEDURE: Informed written consent was obtained from the patient after a thorough discussion of the procedural risks, benefits and alternatives. All questions were addressed. Maximal Sterile Barrier Technique was utilized including caps, mask, sterile gowns, sterile gloves, sterile drape, hand hygiene and skin antiseptic. A timeout was performed prior to the initiation of the procedure. Under sterile conditions and local anesthesia, ultrasound micropuncture access performed of the right upper arm AV graft close to the arterial anastomosis. Contrast injection performed for a complete shuntogram from the anastomosis to the right atrium. Right upper arm shuntogram: Arterial anastomosis to the brachial artery at the elbow is patent. 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 No occlusion. Venous anastomosis to the high brachial vein is patent. Axilla and subclavian veins are patent centrally. Focal severe stenosis noted of the central right innominate vein with jugular and mediastinal collaterals. Collaterals extend across the chest to the left innominate vein. SVC is visualized and patent. Central right innominate venous angioplasty: Also under sterile conditions and local anesthesia, a guidewire was advanced followed by insertion of a 7 [place] sheath. Kumpe catheter and Glidewire were manipulated through the central right innominate venous stenosis. Amplatz guidewire exchange performed  positioned in the IVC. Localization venogram performed. Initially 12 mm overlapping angioplasty performed of the central right innominate venous stenosis for approximately 60 seconds to 20 atmospheres. Balloon was deflated and removed. Repeat venogram demonstrates residual right innominate venous stenosis estimated at greater than 50%. There is less jugular and mediastinal collateral flow. Next, the balloon was upsized to a 14 mm balloon. Overlapping prolonged 14 mm angioplasty performed with a 14 x 4 atlas balloon. Final central venogram demonstrates improved central venous outflow with a residual right innominate venous stenosis estimated at approximately 40- 50%. Jugular and mediastinal collaterals no longer fill. No complication. Access removed. Hemostasis obtained with manual compression. 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 This responded to serial 12 and 14 mm venous angioplasty as above. ACCESS: This access remains amenable to future percutaneous interventions as clinically indicated. Electronically Signed   By: Jerilynn Mages.  Shick M.D.   On: 04/22/2015 16:12     STUDIES:  03/16/15:  Echo: EF: 55-60% Moderate pulmonary HTN:PA peak pressure: 64 mm Hg (S). Mild MV stenosis Pending repeat Echo done 04/19/15  03/22/15 TEE No evidence vegetation Echo 3/29  echo>>>25% to 30%. Doppler parameters are consistent with both elevated ventricular end-diastolic filling pressure and elevated left atrial filling pressure, PA 72 04/21/2015: CXR Significant worsening of severe fluffy parahilar opacities throughout both lungs, favor worsening severe pulmonary edema / ARDS.Stable trace bilateral pleural effusions.   CULTURES: No Pending Cultures  ANTIBIOTICS: Ampicillin 04/22/2015>>> Flagyl (vanc allergy) 3/31>>>  SIGNIFICANT EVENTS:  LINES/TUBES: L PICC Line placed prior hospitalization 04/20/15: R  Femoral HD cath  DISCUSSION: Multifactorial Respiratory Failure in setting of suspected NSTEMI complicated by acute on chronic renal failure and combined acute on chronicCHF / Anemia. Need for HD cath placement and CVVHD/ transfer to ICU and CVVHD to address volume overload. Long discussion with patient and family re: Change in code status to LCB to allow for pressors for hypotension with CVVHD. No intubation, No CPR, no defib, but pressors for BP and chemical code only. She looks better w volume removal.   ASSESSMENT / PLAN:  PULMONARY  A: Respiratory Failure /BiPAP in setting of Volume overload 2/2 RF and CHF  P:  Continued volume removal Wean O2 Change BIPAP to PRN  CARDIOVASCULAR  A: NSTEMI ( mildly elevated troponin) Not a cath candidate per cards until HD and correction of anemia Hypotension History PAF Volume overload Acute on chronic combined systolic/diastolic HF Severe secondary PAH  P:  Pressors for MAP of 60 Lasix per renal  Medical management per cards with ASA, statin,BB and Imdur - per cards  RENAL A:  Acute on Chronic stage 4 RF with renal transplant/ worsening creatinine  P:   Lasix cvvhd per renal  GASTROINTESTINAL A:  No inidcation gastric Supp  P:   Heart smart diet - renal  HEMATOLOGIC A:   Anti-Phospholipid antibody syndrome/ History DVT's Elevated INR ( 3.47) Anemia No evidence of bleeding at present fib P:  Restarted heparin drip (dvt h/o, fib) to bridge as we await therapeutic INR Trend cbc    INFECTIOUS A:   Chronically immunosuppressed patient Afebrile Hypotensive No Cultures pending Recent enterococcus bacteremia c diff colitis  P:   Cont amp; complete 8d (day 4/8 abx) Flagyl IV; cont X 7d after stops abx    ENDOCRINE A: Hypotensive      Adrenal Suppression in a pt on chronic steroids    P:   Monitor CBG's  AC HS Hydrocortisone lower to similar to home dose  NEUROLOGIC A:   No current Issues P:   RASS goal:  0  FAMILY  Updated by me and sister  - Inter-disciplinary family meet or Palliative Care meeting due by:  04/28/15  Erick Colace ACNP-BC [place] Pager # [phone] OR # [phone] if no answer  65 year old female with multiple medical problems presenting to the hospital with SOB and a NSTEMI.  Patient developed respiratory and renal failure.  Tolerating BiPAP for now.  On exam, crackles noted on chest exam.  Continue fluid removal.  BiPAP as needed.  NPO.  Code status clarified.  No intubation/CPR/cardioversion.  ?when will change to IHD.  In the meantime continue amp and flagyl for aspiration coverage.  The patient is critically ill with multiple organ systems failure and requires high complexity decision making for assessment and support, frequent evaluation and titration of therapies, application of advanced monitoring technologies and extensive interpretation of multiple databases.   Critical Care Time devoted to patient care services described in this note is  35  Minutes. This time reflects time of care of this signee Dr Jennet Maduro. This  critical care time does not reflect procedure time, or teaching time or supervisory time of PA/NP/Med student/Med Resident etc but could involve care discussion time.  Rush Farmer, M.D. [facility] Pulmonary/Critical Care Medicine. Pager: [phone]. After hours pager: [phone].

## 2015-04-23 NOTE — Progress Notes (Signed)
SUBJECTIVE:  Breathing better.  No pain.     PHYSICAL EXAM Filed Vitals:   04/23/15 1000 04/23/15 1014 04/23/15 1100 04/23/15 1200  BP:  121/46    Pulse: 82 81 81 82  Temp:      TempSrc:      Resp: 31  19 20   Height:      Weight:      SpO2: 97%  95% 93%   General:  No distress Lungs:  Decreased breath sounds Heart:  RRR Abdomen:  Positive bowel sounds, no rebound no guarding Extremities:  No edema   LABS: Lab Results  Component Value Date   TROPONINI 0.95* 03/27/2015   Results for orders placed or performed during the hospital encounter of 04/16/2015 (from the past 24 hour(s))  Renal function panel (daily at 1600)     Status: Abnormal   Collection Time: 04/22/15  2:00 PM  Result Value Ref Range   Sodium 139 135 - 145 mmol/L   Potassium 4.2 3.5 - 5.1 mmol/L   Chloride 105 101 - 111 mmol/L   CO2 22 22 - 32 mmol/L   Glucose, Bld 146 (H) 65 - 99 mg/dL   BUN 11 6 - 20 mg/dL   Creatinine, Ser 0.90 0.44 - 1.00 mg/dL   Calcium 8.7 (L) 8.9 - 10.3 mg/dL   Phosphorus 3.2 2.5 - 4.6 mg/dL   Albumin 2.0 (L) 3.5 - 5.0 g/dL   GFR calc non Af Amer >60 >60 mL/min   GFR calc Af Amer >60 >60 mL/min   Anion gap 12 5 - 15  Glucose, capillary     Status: Abnormal   Collection Time: 04/22/15  5:37 PM  Result Value Ref Range   Glucose-Capillary 132 (H) 65 - 99 mg/dL  Renal function panel (daily at 0500)     Status: Abnormal   Collection Time: 04/23/15  5:50 AM  Result Value Ref Range   Sodium 139 135 - 145 mmol/L   Potassium 3.7 3.5 - 5.1 mmol/L   Chloride 105 101 - 111 mmol/L   CO2 23 22 - 32 mmol/L   Glucose, Bld 114 (H) 65 - 99 mg/dL   BUN 12 6 - 20 mg/dL   Creatinine, Ser 1.06 (H) 0.44 - 1.00 mg/dL   Calcium 8.9 8.9 - 10.3 mg/dL   Phosphorus 2.8 2.5 - 4.6 mg/dL   Albumin 1.9 (L) 3.5 - 5.0 g/dL   GFR calc non Af Amer 54 (L) >60 mL/min   GFR calc Af Amer >60 >60 mL/min   Anion gap 11 5 - 15  Heparin level (unfractionated)     Status: Abnormal   Collection Time: 04/23/15   5:50 AM  Result Value Ref Range   Heparin Unfractionated 0.89 (H) 0.30 - 0.70 IU/mL  CBC     Status: Abnormal   Collection Time: 04/23/15  5:50 AM  Result Value Ref Range   WBC 12.6 (H) 4.0 - 10.5 K/uL   RBC 3.23 (L) 3.87 - 5.11 MIL/uL   Hemoglobin 9.1 (L) 12.0 - 15.0 g/dL   HCT 29.1 (L) 36.0 - 46.0 %   MCV 90.1 78.0 - 100.0 fL   MCH 28.2 26.0 - 34.0 pg   MCHC 31.3 30.0 - 36.0 g/dL   RDW 18.4 (H) 11.5 - 15.5 %   Platelets 174 150 - 400 K/uL  Magnesium     Status: None   Collection Time: 04/23/15  5:50 AM  Result Value Ref Range   Magnesium 2.4  1.7 - 2.4 mg/dL  Protime-INR     Status: Abnormal   Collection Time: 04/23/15  5:50 AM  Result Value Ref Range   Prothrombin Time 19.5 (H) 11.6 - 15.2 seconds   INR 1.64 (H) 0.00 - 1.49    Intake/Output Summary (Last 24 hours) at 04/23/15 1325 Last data filed at 04/23/15 1200  Gross per 24 hour  Intake 1231.08 ml  Output   3499 ml  Net -2267.92 ml    ASSESSMENT AND PLAN:  RESPIRATORY FAILURE:  Breathing better.   Volume managed per dialysis.  She cannot tolerate med titration.  Continue current meds.   ELEVATED TROPOININ:    NSTEMI.   New wall motion abnormality.  EF is down.  Possible cardiac cath as she progresses through this hospitalization.    Jeneen Rinks Keagan Brislin 04/23/2015 1:25 PM

## 2015-04-23 DEATH — deceased

## 2015-04-24 LAB — RENAL FUNCTION PANEL
ANION GAP: 12 (ref 5–15)
Albumin: 2.1 g/dL — ABNORMAL LOW (ref 3.5–5.0)
Albumin: 2.2 g/dL — ABNORMAL LOW (ref 3.5–5.0)
Anion gap: 13 (ref 5–15)
BUN: 9 mg/dL (ref 6–20)
BUN: 9 mg/dL (ref 6–20)
CALCIUM: 9.3 mg/dL (ref 8.9–10.3)
CHLORIDE: 106 mmol/L (ref 101–111)
CHLORIDE: 107 mmol/L (ref 101–111)
CO2: 21 mmol/L — ABNORMAL LOW (ref 22–32)
CO2: 20 mmol/L — AB (ref 22–32)
CREATININE: 0.88 mg/dL (ref 0.44–1.00)
Calcium: 9 mg/dL (ref 8.9–10.3)
Creatinine, Ser: 1 mg/dL (ref 0.44–1.00)
GFR calc Af Amer: >60 mL/min (ref >60)
GFR, EST NON AFRICAN AMERICAN: 58 mL/min — AB (ref >60)
GLUCOSE: 142 mg/dL — AB (ref 65–99)
Glucose, Bld: 118 mg/dL — ABNORMAL HIGH (ref 65–99)
PHOSPHORUS: 2.5 mg/dL (ref 2.5–4.6)
POTASSIUM: 4.2 mmol/L (ref 3.5–5.1)
Phosphorus: 2.6 mg/dL (ref 2.5–4.6)
Potassium: 4.3 mmol/L (ref 3.5–5.1)
Sodium: 140 mmol/L (ref 135–145)
Sodium: 139 mmol/L (ref 135–145)

## 2015-04-24 LAB — HEPARIN LEVEL (UNFRACTIONATED)
HEPARIN UNFRACTIONATED: 0.53 [IU]/mL (ref 0.30–0.70)
HEPARIN UNFRACTIONATED: 0.66 [IU]/mL (ref 0.30–0.70)

## 2015-04-24 LAB — CBC
HEMATOCRIT: 29.7 % — AB (ref 36.0–46.0)
HEMOGLOBIN: 9.2 g/dL — AB (ref 12.0–15.0)
MCH: 28.1 pg (ref 26.0–34.0)
MCHC: 31 g/dL (ref 30.0–36.0)
MCV: 90.8 fL (ref 78.0–100.0)
Platelets: 165 10*3/uL (ref 150–400)
RBC: 3.27 MIL/uL — AB (ref 3.87–5.11)
RDW: 19.6 % — ABNORMAL HIGH (ref 11.5–15.5)
WBC: 16.8 10*3/uL — ABNORMAL HIGH (ref 4.0–10.5)

## 2015-04-24 LAB — MAGNESIUM: MAGNESIUM: 2.6 mg/dL — AB (ref 1.7–2.4)

## 2015-04-24 LAB — PROTIME-INR
INR: 1.8 — ABNORMAL HIGH (ref 0.00–1.49)
PROTHROMBIN TIME: 20.9 s — AB (ref 11.6–15.2)

## 2015-04-24 MED ORDER — WHITE PETROLATUM GEL
Status: AC
  Administered 2015-04-24: 18:00:00
  Filled 2015-04-24: qty 1

## 2015-04-24 MED ORDER — RENA-VITE PO TABS
1.0000 | ORAL_TABLET | Freq: Every day | ORAL | Status: DC
  Administered 2015-04-24 – 2015-04-28 (×5): 1 via ORAL
  Filled 2015-04-24 (×6): qty 1

## 2015-04-24 MED ORDER — FENTANYL CITRATE (PF) 100 MCG/2ML IJ SOLN: 25.0000 ug | INTRAMUSCULAR | Status: DC | PRN

## 2015-04-24 MED ORDER — ASPIRIN 81 MG PO CHEW
81.0000 mg | CHEWABLE_TABLET | Freq: Every day | ORAL | Status: DC
  Administered 2015-04-24 – 2015-04-28 (×4): 81 mg via ORAL
  Filled 2015-04-24 (×6): qty 1

## 2015-04-24 MED ORDER — FENTANYL CITRATE (PF) 100 MCG/2ML IJ SOLN
12.5000 ug | INTRAMUSCULAR | Status: DC | PRN
Start: 2015-04-24 — End: 2015-04-30
  Administered 2015-04-24: 12.5 ug via INTRAVENOUS
  Filled 2015-04-24: qty 2

## 2015-04-24 NOTE — Progress Notes (Signed)
Subjective: Interval History: has no complaint , breathing easier.  .  Objective: Vital signs in last 24 hours: Temp:  [97.3 F (36.3 C)-97.6 F (36.4 C)] 97.5 F (36.4 C) (04/02 0400) Pulse Rate:  [77-93] 90 (04/02 0300) Resp:  [13-31] 17 (04/02 0500) BP: (121)/(46) 121/46 mmHg (04/01 1014) SpO2:  [93 %-100 %] 100 % (04/02 0300) Arterial Line BP: (106-135)/(42-56) 132/54 mmHg (04/02 0500) Weight:  [62.4 kg (137 lb 9.1 oz)] 62.4 kg (137 lb 9.1 oz) (04/02 0500) Weight change: -4.5 kg (-9 lb 14.7 oz)  Intake/Output from previous day: 04/01 0701 - 04/02 0700 In: 1233.5 [I.V.:673.5; IV Piggyback:560] Out: [id number] [Urine:30] Intake/Output this shift:    General appearance: cooperative, no distress and slowed mentation Resp: rales bibasilar Cardio: regular rate and rhythm, S1, S2 normal and systolic murmur: holosystolic 2/6, blowing at apex GI: pos bs, soft, TX RLQ. liver down 5 cm Extremities: edema 2+ and R arm much less swollen, still some.Kermit Balo B&T UA avg  Lab Results:  Recent Labs  04/23/15 0550 04/24/15 0519  WBC 12.6* 16.8*  HGB 9.1* 9.2*  HCT 29.1* 29.7*  PLT 174 165   BMET:  Recent Labs  04/23/15 1600 04/24/15 0519  NA 138 140  K 4.1 4.3  CL 106 106  CO2 20* 21*  GLUCOSE 163* 142*  BUN 9 9  CREATININE 1.00 0.88  CALCIUM 8.5* 9.0   No results for input(s): PTH in the last 72 hours. Iron Studies: No results for input(s): IRON, TIBC, TRANSFERRIN, FERRITIN in the last 72 hours.  Studies/Results: Ir US Guide Vasc Access Right  04/22/2015  INDICATION: Chronic right upper extremity AV graft, end-stage renal disease, right upper extremity swelling EXAM: ULTRASOUND GUIDANCE FOR VASCULAR ACCESS RIGHT UPPER EXTREMITY AV GRAFT SHUNTOGRAM WITH 12 AND 14 MM RIGHT CENTRAL INNOMINATE VENOUS ANGIOPLASTY MEDICATIONS: 1% LIDOCAINE LOCALLY ANESTHESIA/SEDATION: Moderate Sedation Time:  None. The patient was continuously monitored during the procedure by the interventional  radiology nurse under my direct supervision. FLUOROSCOPY TIME:  Fluoroscopy Time: 4 minutes 36 seconds (35 mGy). COMPLICATIONS: None immediate. PROCEDURE: Informed written consent was obtained from the patient after a thorough discussion of the procedural risks, benefits and alternatives. All questions were addressed. Maximal Sterile Barrier Technique was utilized including caps, mask, sterile gowns, sterile gloves, sterile drape, hand hygiene and skin antiseptic. A timeout was performed prior to the initiation of the procedure. Under sterile conditions and local anesthesia, ultrasound micropuncture access performed of the right upper arm AV graft close to the arterial anastomosis. Contrast injection performed for a complete shuntogram from the anastomosis to the right atrium. Right upper arm shuntogram: Arterial anastomosis to the brachial artery at the elbow is patent. 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 No occlusion. Venous anastomosis to the high brachial vein is patent. Axilla and subclavian veins are patent centrally. Focal severe stenosis noted of the central right innominate vein with jugular and mediastinal collaterals. Collaterals extend across the chest to the left innominate vein. SVC is visualized and patent. Central right innominate venous angioplasty: Also under sterile conditions and local anesthesia, a guidewire was advanced followed by insertion of a 7 [place] sheath. Kumpe catheter and Glidewire were manipulated through the central right innominate venous stenosis. Amplatz guidewire exchange performed positioned in the IVC. Localization venogram performed. Initially 12 mm overlapping angioplasty performed of the central right innominate venous stenosis for approximately 60 seconds to 20 atmospheres. Balloon was deflated and removed. Repeat  venogram demonstrates residual right innominate venous stenosis estimated at  greater than 50%. There is less jugular and mediastinal collateral flow. Next, the balloon was upsized to a 14 mm balloon. Overlapping prolonged 14 mm angioplasty performed with a 14 x 4 atlas balloon. Final central venogram demonstrates improved central venous outflow with a residual right innominate venous stenosis estimated at approximately 40- 50%. Jugular and mediastinal collaterals no longer fill. No complication. Access removed. Hemostasis obtained with manual compression. 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 This responded to serial 12 and 14 mm venous angioplasty as above. ACCESS: This access remains amenable to future percutaneous interventions as clinically indicated. Electronically Signed   By: Jerilynn Mages.  Shick M.D.   On: 04/22/2015 16:12   Dg Chest Port 1 View  04/23/2015  CLINICAL DATA:  65 year old female with acute kidney injury, pulmonary edema, myocardial infarction and cardiomyopathy. EXAM: PORTABLE CHEST 1 VIEW COMPARISON:  04/22/2015 and prior exams FINDINGS: Diffuse bilateral airspace disease/ edema is unchanged. A left-sided PICC line is now noted with tip extending into the right subclavian vein. There is no evidence of pneumothorax or large pleural effusion. No other significant changes identified. IMPRESSION: Left PICC line with tip now extending into the right subclavian vein. Unchanged bilateral airspace disease/ edema. Electronically Signed   By: Margarette Canada M.D.   On: 04/23/2015 09:36   Ir Av Dialy Shunt Intro Needle/intrac Initial W/pta/stent/img Right  04/22/2015  INDICATION: Chronic right upper extremity AV graft, end-stage renal disease, right upper extremity swelling EXAM: ULTRASOUND GUIDANCE FOR VASCULAR ACCESS RIGHT UPPER EXTREMITY AV GRAFT SHUNTOGRAM WITH 12 AND 14 MM RIGHT CENTRAL INNOMINATE VENOUS ANGIOPLASTY MEDICATIONS: 1% LIDOCAINE LOCALLY  ANESTHESIA/SEDATION: Moderate Sedation Time:  None. The patient was continuously monitored during the procedure by the interventional radiology nurse under my direct supervision. FLUOROSCOPY TIME:  Fluoroscopy Time: 4 minutes 36 seconds (35 mGy). COMPLICATIONS: None immediate. PROCEDURE: Informed written consent was obtained from the patient after a thorough discussion of the procedural risks, benefits and alternatives. All questions were addressed. Maximal Sterile Barrier Technique was utilized including caps, mask, sterile gowns, sterile gloves, sterile drape, hand hygiene and skin antiseptic. A timeout was performed prior to the initiation of the procedure. Under sterile conditions and local anesthesia, ultrasound micropuncture access performed of the right upper arm AV graft close to the arterial anastomosis. Contrast injection performed for a complete shuntogram from the anastomosis to the right atrium. Right upper arm shuntogram: Arterial anastomosis to the brachial artery at the elbow is patent. 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 No occlusion. Venous anastomosis to the high brachial vein is patent. Axilla and subclavian veins are patent centrally. Focal severe stenosis noted of the central right innominate vein with jugular and mediastinal collaterals. Collaterals extend across the chest to the left innominate vein. SVC is visualized and patent. Central right innominate venous angioplasty: Also under sterile conditions and local anesthesia, a guidewire was advanced followed by insertion of a 7 [place] sheath. Kumpe catheter and Glidewire were manipulated through the central right innominate venous stenosis. Amplatz guidewire exchange performed positioned in the IVC. Localization venogram performed. Initially 12 mm overlapping angioplasty performed of the central right innominate venous stenosis for approximately 60 seconds to 20  atmospheres. Balloon was deflated and removed. Repeat venogram demonstrates residual right innominate venous stenosis estimated at greater than 50%. There is less jugular and mediastinal collateral flow.  Next, the balloon was upsized to a 14 mm balloon. Overlapping prolonged 14 mm angioplasty performed with a 14 x 4 atlas balloon. Final central venogram demonstrates improved central venous outflow with a residual right innominate venous stenosis estimated at approximately 40- 50%. Jugular and mediastinal collaterals no longer fill. No complication. Access removed. Hemostasis obtained with manual compression. 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 This responded to serial 12 and 14 mm venous angioplasty as above. ACCESS: This access remains amenable to future percutaneous interventions as clinically indicated. Electronically Signed   By: Jerilynn Mages.  Shick M.D.   On: 04/22/2015 16:12    I have reviewed the patient's current medications.  Assessment/Plan: 1 CKD4/ AKI vol much better.  bp stable, acid/base/K stable.  Needs phos. Will convert to IHD 2 Anemia. On ESa, Fe 3 HPTH vit D 4 CAD consider cath later this week 5 Renal TX cont meds 6 ? pneu on AB 7 PAF NSR P stop CRRT when filter times out, cont ESA, vit D, cards f/u.      LOS: 6 days   Arnaldo Heffron L 04/24/2015,7:12 AM

## 2015-04-24 NOTE — Progress Notes (Signed)
ANTICOAGULATION CONSULT NOTE - Follow Up Consult  Pharmacy Consult for Heparin  Indication: atrial fibrillation/DVT/anti-phospholipid syndrome   Patient Measurements: Height: 5\' 1"  (154.9 cm) Weight: 138 lb 14.2 oz (63 kg) IBW/kg (Calculated) : 47.8 Vital Signs: Temp: 97.6 F (36.4 C) (04/01 2351) Temp Source: Oral (04/01 2351) Pulse Rate: 84 (04/01 2300)  Labs:  Recent Labs  04/21/15 0350  04/22/15 0357 04/22/15 1400 04/23/15 0550 04/23/15 1400 04/23/15 1600 04/24/15 0016  HGB  --   --  9.3*  --  9.1*  --   --   --   HCT  --   --  29.9*  --  29.1*  --   --   --   PLT  --   --  187  --  174  --   --   --   LABPROT 19.2*  --  20.1*  --  19.5*  --   --   --   INR 1.61*  --  1.72*  --  1.64*  --   --   --   HEPARINUNFRC  --   < > 0.61  --  0.89* 0.87*  --  0.66  CREATININE 2.09*  < > 1.05* 0.90 1.06*  --  1.00  --   < > = values in this interval not displayed.  Estimated Creatinine Clearance: 47.7 mL/min (by C-G formula based on Cr of 1).   Assessment: Heparin while warfarin on hold, HL is therapeutic x 1 after rate increase, no issues per RN.   Goal of Therapy:  Heparin level 0.3-0.7 units/ml Monitor platelets by anticoagulation protocol: Yes   Plan:  -Cont heparin at 600 units/hr -Confirmatory HL with AM labs  Narda Bonds 04/24/2015,12:54 AM

## 2015-04-24 NOTE — Progress Notes (Signed)
SUBJECTIVE: Pt opens eyes but does not respond to questions.  BP 123/51 mmHg  Pulse 91  Temp(Src) 97.1 F (36.2 C) (Axillary)  Resp 17  Ht 5\' 1"  (1.549 m)  Wt 137 lb 9.1 oz (62.4 kg)  BMI 26.01 kg/m2  SpO2 99%  LMP  (LMP Unknown)  Intake/Output Summary (Last 24 hours) at 04/24/15 1133 Last data filed at 04/24/15 1100  Gross per 24 hour  Intake 1289.5 ml  Output   4729 ml  Net -3439.5 ml    PHYSICAL EXAM General: Well developed, frail, chronically ill appearing in no acute distress.  HEENT: normal  Neck: No JVD.  Lungs: CTA anteriorly Heart: RRR Abdomen: Soft, not distended Extremities: No lower extremity edema.   LABS: Basic Metabolic Panel:  Recent Labs  04/23/15 0550 04/23/15 1600 04/24/15 0519  NA 139 138 140  K 3.7 4.1 4.3  CL 105 106 106  CO2 23 20* 21*  GLUCOSE 114* 163* 142*  BUN 12 9 9   CREATININE 1.06* 1.00 0.88  CALCIUM 8.9 8.5* 9.0  MG 2.4  --  2.6*  PHOS 2.8 2.5 2.6   CBC:  Recent Labs  04/23/15 0550 04/24/15 0519  WBC 12.6* 16.8*  HGB 9.1* 9.2*  HCT 29.1* 29.7*  MCV 90.1 90.8  PLT 174 165    Current Meds: . allopurinol  200 mg Oral Daily  . ampicillin (OMNIPEN) IV  2 g Intravenous Q8H  . antiseptic oral rinse  7 mL Mouth Rinse q12n4p  . atorvastatin  40 mg Oral q1800  . calcitRIOL  0.25 mcg Oral QODAY  . calcitRIOL  0.5 mcg Oral QODAY  . chlorhexidine  15 mL Mouth Rinse BID  . [START ON 04/26/2015] darbepoetin (ARANESP) injection - NON-DIALYSIS  200 mcg Subcutaneous Q Tue-1800  . ferric gluconate (FERRLECIT/NULECIT) IV  125 mg Intravenous Daily  . hydrocortisone sod succinate (SOLU-CORTEF) inj  25 mg Intravenous Daily  . latanoprost  1 drop Both Eyes QHS  . metoprolol tartrate  12.5 mg Oral BID  . metronidazole  500 mg Intravenous Q8H  . multivitamin  1 tablet Oral QHS  . mycophenolate  180 mg Oral BID  . sodium chloride flush  10 mL Intravenous Q12H  . tacrolimus  2 mg Oral BID  . timolol  1 drop Both Eyes BID      ASSESSMENT AND PLAN:  1. NSTEMI: Pt admitted with chest pain and dyspnea with volume overload in setting of acute on chronic renal failure and found to have mildly elevated troponin. New wall motion abnormality on echo. Plan has been for cath if she improves; she will not respond to questions at present. She will need to make significant improvement prior to consideration of cath. Continue statin and lopressor; add ASA.  2. Cardiomyopathy/Acute on Chronic Diastolic CHF: Echo now with new wall motion abnormality and LVEF=25-30%. EF was normal in February 2017. Volume management per dialysis.  3. 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 She is now on CRRT.   4. Anti-phospholipid antibody syndrome/ history of DVT's: She is on chronic coumadin but this has been held recently due to supra- therapeutic INR. INR is now under 2. Continue heparin.  5. History of PAF: Continue beta blocker. Pt in sinus at present. This patients CHA2DS2-VASc Score and unadjusted Ischemic Stroke Rate (% per year) is equal to 7.2 % stroke rate/year from a score of 5 (CHF, HTN, Vascular, Age, Female). Continue heparin.   Debbie [name]  Zyanna Leisinger  4/2/201711:33 AM

## 2015-04-24 NOTE — Progress Notes (Signed)
[facility] for heparin Indication: atrial fibrillation  Allergies  Allergen Reactions  . Feraheme [Ferumoxytol] Other (See Comments)    Chest pain, pulsating 02/17/15: tolerated Nulecit  . Vancomycin Anaphylaxis, Itching, Swelling and Other (See Comments)    Tongue swell  . Dorzolamide Hcl-Timolol Mal Rash and Other (See Comments)    Eye pain  . Gentamycin [Gentamicin Sulfate] Itching and Swelling  . Latanoprost Rash and Other (See Comments)    Eye pain  . Cefazolin Itching  . Codeine Itching  . Hydrocodone-Acetaminophen Itching  . Penicillins Itching    *tolerated ampicillin during 02/2015 admission Has patient had a PCN reaction causing immediate rash, facial/tongue/throat swelling, SOB or lightheadedness with hypotension:No Has patient had a PCN reaction causing severe rash involving mucus membranes or skin necrosis:No Has patient had a PCN reaction that required hospitalization:No Has patient had a PCN reaction occurring within the last 10 years:No If all of the above answers are "NO", then may proceed with Cephalosporin use.     Patient Measurements: Height: 5\' 1"  (154.9 cm) Weight: 137 lb 9.1 oz (62.4 kg) IBW/kg (Calculated) : 47.8 Heparin Dosing Weight: 63kg   Vital Signs: Temp: 97.1 F (36.2 C) (04/02 0852) Temp Source: Axillary (04/02 0852) BP: 123/51 mmHg (04/02 0935) Pulse Rate: 91 (04/02 0935)  Labs:  Recent Labs  04/22/15 0357  04/23/15 0550 04/23/15 1400 04/23/15 1600 04/24/15 0016 04/24/15 0519 04/24/15 0522  HGB 9.3*  --  9.1*  --   --   --  9.2*  --   HCT 29.9*  --  29.1*  --   --   --  29.7*  --   PLT 187  --  174  --   --   --  165  --   LABPROT 20.1*  --  19.5*  --   --   --   --  20.9*  INR 1.72*  --  1.64*  --   --   --   --  1.80*  HEPARINUNFRC 0.61  --  0.89* 0.87*  --  0.66  --  0.53  CREATININE 1.05*  < > 1.06*  --  1.00  --  0.88  --   < > = values in this interval not displayed.  Estimated  Creatinine Clearance: 53.9 mL/min (by C-G formula based on Cr of 0.88).   Medications:  Infusions:  . sodium chloride 20 mL/hr at 04/22/15 2000  . heparin 600 Units/hr (04/23/15 2121)  . nitroGLYCERIN    . dialysis replacement fluid (prismasate) 700 mL/hr at 04/24/15 0510  . dialysis replacement fluid (prismasate) 700 mL/hr at 04/24/15 0854  . dialysate (PRISMASATE) 2,000 mL/hr at 04/24/15 0510    Assessment: 32 yof presented to the hospital with CP. She is on chronic coumadin for hx afib, DVT and anti-phospholipid syndrome. Coumadin was reversed and INR is now subtherapeutic so she was initiated on IV heparin. Heparin level is now therapeutic. No bleeding noted, CBC is stable.   Goal of Therapy:  Heparin level 0.3-0.7 units/ml Monitor platelets by anticoagulation protocol: Yes   Plan:  - Continue heparin 600 units/hr - Daily heparin level and CBC  Salome Arnt, PharmD, BCPS Pager # [phone] 04/24/2015 11:43 AM

## 2015-04-24 NOTE — Progress Notes (Signed)
PULMONARY / CRITICAL CARE MEDICINE   Name: Debbie [name] MRN: [id number] DOB: Jun 20, 1950    ADMISSION DATE:  03/29/2015 CONSULTATION DATE:  04/20/15  REFERRING MD:  Deterding  CHIEF COMPLAINT:  Multi factorial Respiratory Failure/ Renal Failure: Need for CVVHD access and CVVHD, transfer to ICU / CCM to assume care until stabalized. Pt. Is a DNR.  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 She has subsequently developed acute on chronic stage 4 renal failure, acute on chronic combined systolic and diastolic  CHF ( BNP [id number] edema evolving to respiratory failure requiring BiPAP. She is a DNR and does not wish to be intubated.She is hypotensive and therefore CVVH is the best option to gently diurese her to improve respiratory status.CCM has been consulted and will assume care while in the ICU for CVVHD treatment and until patient is stabalized.  SUBJECTIVE:   VITAL SIGNS: BP 123/51 mmHg  Pulse 91  Temp(Src) 97.1 F (36.2 C) (Axillary)  Resp 18  Ht 5\' 1"  (1.549 m)  Wt 137 lb 9.1 oz (62.4 kg)  BMI 26.01 kg/m2  SpO2 99%  LMP  (LMP Unknown)  HEMODYNAMICS:  VENTILATOR SETTINGS:    INTAKE / OUTPUT: I/O last 3 completed shifts: In: 1900 [I.V.:1058; IV Piggyback:842] Out: [id number] [Urine:55; [id number]  PHYSICAL EXAMINATION: General: awake, no distress. Confused intermittently  Neuro:  Alert and oriented x 2, appropriate @ times, MAE x 4 HEENT: no JVD MM a little dry  Cardiovascular: RRR, no S3, S4, rubs or murmurs Lungs: not labored crackles, coarse Abdomen:  Soft, non-tender, non-distended, BS + x 4  quads Extremities:No clubbing/ cyanosis, 3+ pitting edema R>L upper and lower extremities, Left arm beneath PICC with darkened skin. Skin:intact  LABS:  BMET  Recent Labs Lab 04/23/15 0550 04/23/15 1600 04/24/15 0519  NA 139 138 140  K 3.7 4.1 4.3  CL 105 106 106  CO2 23 20* 21*  BUN 12 9 9   CREATININE 1.06* 1.00 0.88  GLUCOSE 114* 163* 142*    Electrolytes  Recent Labs Lab 04/22/15 0357  04/23/15 0550 04/23/15 1600 04/24/15 0519  CALCIUM 8.8*  < > 8.9 8.5* 9.0  MG 2.4  --  2.4  --  2.6*  PHOS 2.1*  < > 2.8 2.5 2.6  < > = values in this interval not displayed.  CBC  Recent Labs Lab 04/22/15 0357 04/23/15 0550 04/24/15 0519  WBC 15.5* 12.6* 16.8*  HGB 9.3* 9.1* 9.2*  HCT 29.9* 29.1* 29.7*  PLT 187 174 165    Coag's  Recent Labs Lab 04/19/15 0359  04/22/15 0357 04/23/15 0550 04/24/15 0522  APTT 60*  --   --   --   --   INR 3.09*  < > 1.72* 1.64* 1.80*  < > = values in this interval not displayed.  Sepsis Markers  Recent Labs Lab 04/20/15 1347 04/20/15 1359 04/21/15 0350 04/22/15 0357  LATICACIDVEN 1.3  --   --   --   PROCALCITON  --  3.16 2.32 2.24    ABG No results for input(s): PHART, PCO2ART, PO2ART in the last 168 hours.  Liver Enzymes  Recent Labs Lab 04/19/15  [id number]  04/23/15 0550 04/23/15 1600 04/24/15 0519  AST 33  --   --   --   --   ALT 12*  --   --   --   --   ALKPHOS 63  --   --   --   --   BILITOT 0.9  --   --   --   --   ALBUMIN 1.9*  < > 1.9* 1.9* 2.1*  < > = values in this interval not displayed.  Cardiac Enzymes  Recent Labs Lab 04/02/2015 1751  TROPONINI 0.95*    Glucose  Recent Labs Lab 04/20/15 1052 04/21/15 0319 04/22/15 1737  GLUCAP 128* 120* 132*    Imaging No results found.   STUDIES:  03/16/15:  Echo: EF: 55-60% Moderate pulmonary HTN:PA peak pressure: 64 mm Hg (S). Mild MV stenosis Pending repeat Echo done 04/19/15  03/22/15 TEE No evidence vegetation Echo 3/29 echo>>>25% to 30%.  Doppler parameters are consistent with both elevated ventricular end-diastolic filling pressure and elevated left atrial filling pressure, PA 72 04/06/2015: CXR Significant worsening of severe fluffy parahilar opacities throughout both lungs, favor worsening severe pulmonary edema / ARDS.Stable trace bilateral pleural effusions.   CULTURES: No Pending Cultures  ANTIBIOTICS: Ampicillin 03/26/2015>>> Flagyl (vanc allergy) 3/31>>>  SIGNIFICANT EVENTS:  LINES/TUBES: L PICC Line placed prior hospitalization 04/20/15: R Femoral HD cath  DISCUSSION: Multifactorial Respiratory Failure in setting of suspected NSTEMI complicated by acute on chronic renal failure and combined acute on chronicCHF / Anemia. Need for HD cath placement and CVVHD/ transfer to ICU and CVVHD to address volume overload. Long discussion with patient and family re: Change in code status to LCB to allow for pressors for hypotension with CVVHD. No intubation, No CPR, no defib, but pressors for BP and chemical code only. She looks better w volume removal. 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/ worsening creatinine  P:   Plan to change to IHD when filter clots  Cont supportive care   GASTROINTESTINAL A:  No inidcation gastric Supp  P:   Heart smart diet - renal  HEMATOLOGIC A:   Anti-Phospholipid  antibody syndrome/ History DVT's Elevated INR ( 3.47) Anemia No evidence of bleeding at present fib P:  Restarted heparin drip (dvt h/o, fib) to bridge as we await therapeutic INR Trend cbc    INFECTIOUS A:   Chronically immunosuppressed patient Afebrile Hypotensive No Cultures pending Recent enterococcus bacteremia c diff colitis  P:   Cont amp; complete 8d (day 4/8 abx) Flagyl IV; cont X 7d after stops abx    ENDOCRINE A: Hypotensive      Adrenal Suppression in a pt on chronic steroids    P:   Monitor CBG's  AC HS Hydrocortisone lower to similar to home dose  NEUROLOGIC A:   Delirium  P:   RASS goal:  0  FAMILY  Updated by me and sister  - Inter-disciplinary family meet or Palliative Care meeting due by:  04/28/15  Erick Colace ACNP-BC [place] Pager # [phone] OR # [phone] if no answer  Attending Note:  65 year old female with multiple medical problems presenting to the hospital with SOB and a NSTEMI. Patient developed respiratory and renal failure. Tolerating BiPAP for now. On exam, crackles noted on chest exam. Continue fluid removal. BiPAP as needed. NPO. Code status clarified. No intubation/CPR/cardioversion. Once filter clots then will change to standard HD. In the meantime continue amp and flagyl for aspiration coverage.  Will likely involve palliative care on Monday and recommend comfort.  I do not foresee this patient improving to the point of full recovery.  The patient is critically ill with multiple organ systems failure and requires high complexity decision making for assessment and support, frequent evaluation and titration of therapies, application of advanced monitoring technologies and extensive interpretation of multiple databases.   Critical Care Time devoted to patient care services described in this note is 35 Minutes. This time reflects time of care of this signee Dr Jennet Maduro. This critical care time  does not reflect procedure time, or teaching time or supervisory time of PA/NP/Med student/Med Resident etc but could involve care discussion time.  Rush Farmer, M.D. [facility] Pulmonary/Critical Care Medicine. Pager: [phone]. After hours pager: [phone].

## 2015-04-25 DIAGNOSIS — R7989 Other specified abnormal findings of blood chemistry: Secondary | ICD-10-CM

## 2015-04-25 LAB — RENAL FUNCTION PANEL
ALBUMIN: 2.1 g/dL — AB (ref 3.5–5.0)
Anion gap: 13 (ref 5–15)
BUN: 11 mg/dL (ref 6–20)
CALCIUM: 9.3 mg/dL (ref 8.9–10.3)
CO2: 20 mmol/L — ABNORMAL LOW (ref 22–32)
CREATININE: 1.18 mg/dL — AB (ref 0.44–1.00)
Chloride: 107 mmol/L (ref 101–111)
GFR calc Af Amer: 55 mL/min — ABNORMAL LOW (ref >60)
GFR, EST NON AFRICAN AMERICAN: 47 mL/min — AB (ref >60)
GLUCOSE: 104 mg/dL — AB (ref 65–99)
PHOSPHORUS: 2.4 mg/dL — AB (ref 2.5–4.6)
POTASSIUM: 4 mmol/L (ref 3.5–5.1)
SODIUM: 140 mmol/L (ref 135–145)

## 2015-04-25 LAB — CBC
HEMATOCRIT: 29.4 % — AB (ref 36.0–46.0)
Hemoglobin: 9.2 g/dL — ABNORMAL LOW (ref 12.0–15.0)
MCH: 28.8 pg (ref 26.0–34.0)
MCHC: 31.3 g/dL (ref 30.0–36.0)
MCV: 92.2 fL (ref 78.0–100.0)
PLATELETS: 144 10*3/uL — AB (ref 150–400)
RBC: 3.19 MIL/uL — ABNORMAL LOW (ref 3.87–5.11)
RDW: 20.9 % — AB (ref 11.5–15.5)
WBC: 20.8 10*3/uL — AB (ref 4.0–10.5)

## 2015-04-25 LAB — TACROLIMUS LEVEL: TACROLIMUS (FK506) - LABCORP: 12.3 ng/mL (ref 2.0–20.0)

## 2015-04-25 LAB — PROTIME-INR
INR: 1.42 (ref 0.00–1.49)
PROTHROMBIN TIME: 17.5 s — AB (ref 11.6–15.2)

## 2015-04-25 LAB — MAGNESIUM: Magnesium: 2.7 mg/dL — ABNORMAL HIGH (ref 1.7–2.4)

## 2015-04-25 LAB — HEPARIN LEVEL (UNFRACTIONATED): Heparin Unfractionated: 0.56 IU/mL (ref 0.30–0.70)

## 2015-04-25 MED ORDER — NEPRO/CARBSTEADY PO LIQD
237.0000 mL | Freq: Three times a day (TID) | ORAL | Status: DC | PRN
  Administered 2015-04-28 (×2): 237 mL via ORAL
  Filled 2015-04-25: qty 237

## 2015-04-25 MED ORDER — SODIUM CHLORIDE 0.9 % IV SOLN
2.0000 g | Freq: Two times a day (BID) | INTRAVENOUS | Status: AC
  Administered 2015-04-25 – 2015-04-26 (×3): 2 g via INTRAVENOUS
  Filled 2015-04-25 (×3): qty 2000

## 2015-04-25 MED ORDER — CETYLPYRIDINIUM CHLORIDE 0.05 % MT LIQD
7.0000 mL | Freq: Two times a day (BID) | OROMUCOSAL | Status: DC
  Administered 2015-04-25 – 2015-04-28 (×7): 7 mL via OROMUCOSAL

## 2015-04-25 NOTE — Progress Notes (Signed)
CVVHD Management.  CXR still showing some pulm edema. Electrolytes stable.  Hemodynamically stable.  UO remains minimal.  + bruit in RUA AVG.  Will DC CRRT at noon today.

## 2015-04-25 NOTE — Progress Notes (Signed)
Nutrition Follow-up / Calorie Count  DOCUMENTATION CODES:   Not applicable  INTERVENTION:    D/C calorie count, intake is inadequate, patient is consuming 0% of meals.  No need for strict diet as patient is not eating, recommend Regular diet.  Offer Nepro shake when intake of meals is poor.  If supportive care to continue, consider TF.  NUTRITION DIAGNOSIS:   Inadequate oral intake related to poor appetite as evidenced by  (RN report).  Ongoing  GOAL:   Patient will meet greater than or equal to 90% of their needs  Unmet  MONITOR:   PO intake, Supplement acceptance, Labs, Weight trends   ASSESSMENT:   Pt with PMH of anti-phospholipid antibody syndrome/ history of DVT's, PAF, chronic systolic CHF, ESRD (s/p transplant in 2010, now on chronic immunosuppression), HTN, HLD, severe pulmonary HTN, and recent NSTEMI in 01/2017who presents to [facility] ED on 04/19/2015 for evaluation of chest pain. She has subsequently developed acute on chronic stage 4 renal failure, acute on chronic combined systolic and diastolic CHF,pulmonary edema evolving to respiratory failure requiring BiPAP. She is a DNR and does not wish to be intubated.She is hypotensive and therefore CVVH is the best option to gently diurese her   Patient continues to eat poorly, consuming 0% of meals as documented in Calorie Count envelope. Will d/c calorie count. CRRT is being d/c'ed today at 12 noon. Palliative Care Team continues to follow patient.   Diet Order:  Diet renal/carb modified with fluid restriction Diet-HS Snack?: Nothing; Room service appropriate?: Yes; Fluid consistency:: Thin  Skin:  Wound (see comment) (NOT Pressure injuries, wound to coccyx and buttocks )  Last BM:  4/1  Height:   Ht Readings from Last 1 Encounters:  04/22/15 5\' 1"  (1.549 m)    Weight:   Wt Readings from Last 1 Encounters:  04/25/15 139 lb 12.4 oz (63.4 kg)    Ideal Body Weight:  47.7 kg  BMI:  Body mass index is  26.42 kg/(m^2).  Estimated Nutritional Needs:   Kcal:  1700-1900 (25 kcal/kg)  Protein:  100-110 grams (1.5 g/kg)  Fluid:  Per MD  EDUCATION NEEDS:   No education needs identified at this time  Molli Barrows, [place], [place], [place] Pager [phone] After Hours Pager [phone]

## 2015-04-25 NOTE — Progress Notes (Signed)
Palliative Medicine RN F/U: have been following peripherally. PMT will continue to do so, especially in light of plan to d/c CRRT today. Please call with any questions/concerns.  Larina Earthly, RN, BSN, [facility] 04/25/2015 10:09 AM Cell [phone] 8:00-4:00 Monday-Friday Office [phone]

## 2015-04-25 NOTE — Progress Notes (Signed)
PULMONARY / CRITICAL CARE MEDICINE   Name: Debbie [name] MRN: [id number] DOB: March 24, 1950    ADMISSION DATE:  03/28/2015 CONSULTATION DATE:  04/20/15  REFERRING MD:  Deterding  CHIEF COMPLAINT:  Multi factorial Respiratory Failure/ Renal Failure: Need for CVVHD access and CVVHD, transfer to ICU / CCM to assume care until stabalized. Pt. Is a DNR.  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 She has subsequently developed acute on chronic stage 4 renal failure, acute on chronic combined systolic and diastolic  CHF ( BNP 123XX123 edema evolving to respiratory failure requiring BiPAP. She is a DNR and does not wish to be intubated.She is hypotensive and therefore CVVH is the best option to gently diurese her to improve respiratory status.CCM has been consulted and will assume care while in the ICU for CVVHD treatment and until patient is stabalized.  SUBJECTIVE:  No issues overnight. Less diarrhea. Tolerating CVVH.   VITAL SIGNS: BP 108/49 mmHg  Pulse 73  Temp(Src) 97 F (36.1 C) (Axillary)  Resp 18  Ht 5\' 1"  (1.549 m)  Wt 139 lb 12.4 oz (63.4 kg)  BMI 26.42 kg/m2  SpO2 100%  LMP  (LMP Unknown)  HEMODYNAMICS:  VENTILATOR SETTINGS:    INTAKE / OUTPUT: I/O last 3 completed shifts: In: [id number] [I.V.:814; IV Piggyback:700] Out: 6366 [Urine:30; Other:6336]  PHYSICAL EXAMINATION: General: awake, no distress. Confused intermittently  Neuro:  Alert and oriented x 2, appropriate @ times, MAE x 4 HEENT: no JVD MM a little dry  Cardiovascular: RRR, no S3, S4, rubs or murmurs Lungs: fair ae. Bibasilar crackles.   Abdomen:  Soft, non-tender, non-distended, BS + x 4 quads Extremities:No clubbing/ cyanosis, 3+ pitting edema R>L upper and lower extremities, Left arm beneath PICC with darkened skin. Skin:intact  LABS:  BMET  Recent Labs Lab 04/24/15 0519 04/24/15 1629 04/25/15 0451  NA 140 139 140  K 4.3 4.2 4.0  CL 106 107 107  CO2 21* 20* 20*  BUN 9 9 11   CREATININE 0.88 1.00 1.18*  GLUCOSE 142* 118* 104*    Electrolytes  Recent Labs Lab 04/23/15 0550  04/24/15 0519 04/24/15 1629 04/25/15 0451  CALCIUM 8.9  < > 9.0 9.3 9.3  MG 2.4  --  2.6*  --  2.7*  PHOS 2.8  < > 2.6 2.5 2.4*  < > = values in this interval not displayed.  CBC  Recent Labs Lab 04/23/15 0550 04/24/15 0519 04/25/15 0451  WBC 12.6* 16.8* 20.8*  HGB 9.1* 9.2* 9.2*  HCT 29.1* 29.7* 29.4*  PLT 174 165 144*    Coag's  Recent Labs Lab 04/19/15 0359  04/23/15 0550 04/24/15 0522 04/25/15 0451  APTT 60*  --   --   --   --   INR 3.09*  < > 1.64* 1.80* 1.42  < > = values in this interval not displayed.  Sepsis Markers  Recent Labs Lab 04/20/15 1347 04/20/15 1359 04/21/15 0350 04/22/15 0357  LATICACIDVEN 1.3  --   --   --   PROCALCITON  --  3.16 2.32 2.24    ABG No results for input(s): PHART, PCO2ART, PO2ART in the last 168  hours.  Liver Enzymes  Recent Labs Lab 04/19/15 0359  04/24/15 0519 04/24/15 1629 04/25/15 0451  AST 33  --   --   --   --   ALT 12*  --   --   --   --   ALKPHOS 63  --   --   --   --   BILITOT 0.9  --   --   --   --   ALBUMIN 1.9*  < > 2.1* 2.2* 2.1*  < > = values in this interval not displayed.  Cardiac Enzymes  Recent Labs Lab 04/12/2015 1751  TROPONINI 0.95*    Glucose  Recent Labs Lab 04/20/15 1052 04/21/15 0319 04/22/15 1737  GLUCAP 128* 120* 132*    Imaging No results found.   STUDIES:  03/16/15:  Echo: EF: 55-60% Moderate pulmonary HTN:PA peak pressure: 64 mm Hg (S). Mild MV stenosis Pending repeat Echo done 04/19/15  03/22/15 TEE  No evidence vegetation Echo 3/29 echo>>>25% to 30%. Doppler parameters are consistent with both elevated ventricular end-diastolic filling pressure and elevated left atrial filling pressure, PA 72 04/17/2015: CXR Significant worsening of severe fluffy parahilar opacities throughout both lungs, favor worsening severe pulmonary edema / ARDS.Stable trace bilateral pleural effusions.   CULTURES: No Pending Cultures  ANTIBIOTICS: Ampicillin 04/15/2015>>> Flagyl (vanc allergy) 3/31>>>  SIGNIFICANT EVENTS: 3/27 admitted for SOB 3/29 transfer to icu for CVVH.  LINES/TUBES: L PICC Line placed prior hospitalization 04/20/15: R Femoral HD cath  DISCUSSION: Multifactorial Respiratory Failure in setting of suspected NSTEMI complicated by acute on chronic renal failure and combined acute on chronicCHF / Anemia. Need for HD cath placement and CVVHD/ transfer to ICU and CVVHD to address volume overload. Long discussion with patient and family re: Change in code status to LCB to allow for pressors for hypotension with CVVHD. No intubation, No CPR, no defib, but pressors for BP and chemical code only. She looks better w volume removal. When filter clots she can leave the ICU as will plan for transition to IHD  ASSESSMENT / PLAN:  PULMONARY  A: Respiratory Failure /BiPAP in setting of Volume overload 2/2 RF and CHF  P:  Continued volume removal with CVVH  Wean O2 BiPaP PRN  CARDIOVASCULAR  A: NSTEMI ( mildly elevated troponin) Not a cath candidate per cards until HD and correction of anemia Hypotension History PAF Volume overload Acute on chronic combined systolic/diastolic HF Severe secondary PAH  P:  Pressors for MAP of 60 > weaned off Lasix per renal  Medical management per cards with ASA, statin,BB and Imdur - per cards  RENAL A:  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  Cont supportive  care  Renal following pt.   GASTROINTESTINAL A:  No inidcation gastric Supp  P:   Heart smart diet - renal  HEMATOLOGIC A:   Anti-Phospholipid antibody syndrome/ History DVT's Elevated INR ( 3.47) Anemia No evidence of bleeding at present  P:  Restarted heparin drip (dvt h/o, fib) to bridge as we await therapeutic INR Trend cbc    INFECTIOUS A:   Chronically immunosuppressed patient Afebrile Hypotensive No Cultures pending Recent enterococcus bacteremia c diff colitis  P:   Cont amp; complete 8d (day 4/8 abx) Flagyl IV; cont X 7d after stops abx    ENDOCRINE A: Hypotensive > BP better     Adrenal Suppression in a pt on chronic  steroids    P:   Monitor CBG's  AC HS Hydrocortisone lower to similar to home dose  NEUROLOGIC A:   Delirium > better P:   RASS goal: 0  FAMILY  No family at bedside.   - Inter-disciplinary family meet or Palliative Care meeting due by:  04/28/15  Pt to be transferred to SDU.  Pt's CVVH will finish at noon and will be transferred to SDU after. She was transferred to ICU for CVVH last week.  Spoke to Dr. Malen Gauze re: pt's case. TH will see pt starting 4/4. PCCM will sign off then unless with issues.   Monica Becton, MD 04/25/2015, 2:16 PM [place] [facility] Pager [phone] After 3 pm or if no answer, call [phone]

## 2015-04-25 NOTE — Progress Notes (Signed)
SUBJECTIVE: Pt opens eyes to command. Answers yes and no questions. Knows she is in the hospital. No chest pain.   BP 108/49 mmHg  Pulse 73  Temp(Src) 97.2 F (36.2 C) (Axillary)  Resp 18  Ht 5\' 1"  (1.549 m)  Wt 63.4 kg (139 lb 12.4 oz)  BMI 26.42 kg/m2  SpO2 100%  LMP  (LMP Unknown)  Intake/Output Summary (Last 24 hours) at 04/25/15 1130 Last data filed at 04/25/15 1100  Gross per 24 hour  Intake    888 ml  Output   3813 ml  Net  -2925 ml    PHYSICAL EXAM General: Well developed, frail, chronically ill appearing in no acute distress.  HEENT: normal  Neck: No JVD.  Lungs: CTA anteriorly Heart: RRR, no gallop, murmur, or rub Abdomen: Soft, not distended Extremities: No lower extremity edema. RUE edematous.   LABS: Basic Metabolic Panel:  Recent Labs  04/24/15 0519 04/24/15 1629 04/25/15 0451  NA 140 139 140  K 4.3 4.2 4.0  CL 106 107 107  CO2 21* 20* 20*  GLUCOSE 142* 118* 104*  BUN 9 9 11   CREATININE 0.88 1.00 1.18*  CALCIUM 9.0 9.3 9.3  MG 2.6*  --  2.7*  PHOS 2.6 2.5 2.4*   CBC:  Recent Labs  04/24/15 0519 04/25/15 0451  WBC 16.8* 20.8*  HGB 9.2* 9.2*  HCT 29.7* 29.4*  MCV 90.8 92.2  PLT 165 144*    Current Meds: . allopurinol  200 mg Oral Daily  . ampicillin (OMNIPEN) IV  2 g Intravenous BID  . antiseptic oral rinse  7 mL Mouth Rinse q12n4p  . aspirin  81 mg Oral Daily  . atorvastatin  40 mg Oral q1800  . calcitRIOL  0.25 mcg Oral QODAY  . calcitRIOL  0.5 mcg Oral QODAY  . chlorhexidine  15 mL Mouth Rinse BID  . [START ON 04/26/2015] darbepoetin (ARANESP) injection - NON-DIALYSIS  200 mcg Subcutaneous Q Tue-1800  . ferric gluconate (FERRLECIT/NULECIT) IV  125 mg Intravenous Daily  . hydrocortisone sod succinate (SOLU-CORTEF) inj  25 mg Intravenous Daily  . latanoprost  1 drop Both Eyes QHS  . metoprolol tartrate  12.5 mg Oral BID  . metronidazole  500 mg Intravenous Q8H  . multivitamin  1 tablet Oral QHS  . mycophenolate  180 mg  Oral BID  . sodium chloride flush  10 mL Intravenous Q12H  . tacrolimus  2 mg Oral BID  . timolol  1 drop Both Eyes BID     ASSESSMENT AND PLAN:  1. NSTEMI: Pt admitted 03/28/2015 with chest pain and dyspnea with volume overload in setting of acute on chronic renal failure and severe anemia and found to have mildly elevated troponin. New wall motion abnormality on echo. Plan has been for cath if she improves; she is currently responding to questions which is an improvement. She will need to make continued improvement prior to consideration of cath. Continue statin and lopressor; add ASA. Will need to clarify goals of care as condition improves prior to performing invasive evaluation.  2. Cardiomyopathy/Acute on Chronic Diastolic CHF: Echo now with new wall motion abnormality and LVEF=25-30%. EF was normal in February 2017. Volume management per dialysis. Plan to DC CRRT after treatment today.   3. 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   4. Anti-phospholipid antibody syndrome/ history of DVT's: She is on chronic coumadin but this has been held recently due to supra- therapeutic INR  and anemia. INR is now under 2. Continue heparin.  5. History of PAF: Continue beta blocker. Pt in sinus at present. This patients CHA2DS2-VASc Score and unadjusted Ischemic Stroke Rate (% per year) is equal to 7.2 % stroke rate/year from a score of 5 (CHF, HTN, Vascular, Age, Female). Continue heparin.   Collier Salina [facility]   4/3/201711:30 AM

## 2015-04-25 NOTE — Progress Notes (Signed)
Pharmacy Antibiotic Note  Debbie [name] is a 65 y.o. female admitted on 04/03/2015 with enterococcus bacteremia.  Pharmacy has been consulted for ampicillin dosing. Pt stopping CRRT today.   Plan: Ampicillin IV 2g q12h with stop date of 4/4 entered Continue Flagyl for cdiff Monitor clinical course, HD plan  Height: 5\' 1"  (154.9 cm) Weight: 139 lb 12.4 oz (63.4 kg) IBW/kg (Calculated) : 47.8  Temp (24hrs), Avg:97.4 F (36.3 C), Min:97.2 F (36.2 C), Max:97.6 F (36.4 C)   Recent Labs Lab 04/20/15 0310 04/20/15 1347  04/22/15 0357  04/23/15 0550 04/23/15 1600 04/24/15 0519 04/24/15 1629 04/25/15 0451  WBC 17.7*  --   --  15.5*  --  12.6*  --  16.8*  --  20.8*  CREATININE 3.48*  --   < > 1.05*  < > 1.06* 1.00 0.88 1.00 1.18*  LATICACIDVEN  --  1.3  --   --   --   --   --   --   --   --   < > = values in this interval not displayed.  Estimated Creatinine Clearance: 40.5 mL/min (by C-G formula based on Cr of 1.18).    Allergies  Allergen Reactions  . Feraheme [Ferumoxytol] Other (See Comments)    Chest pain, pulsating 02/17/15: tolerated Nulecit  . Vancomycin Anaphylaxis, Itching, Swelling and Other (See Comments)    Tongue swell  . Dorzolamide Hcl-Timolol Mal Rash and Other (See Comments)    Eye pain  . Gentamycin [Gentamicin Sulfate] Itching and Swelling  . Latanoprost Rash and Other (See Comments)    Eye pain  . Cefazolin Itching  . Codeine Itching  . Hydrocodone-Acetaminophen Itching  . Penicillins Itching    *tolerated ampicillin during 02/2015 admission Has patient had a PCN reaction causing immediate rash, facial/tongue/throat swelling, SOB or lightheadedness with hypotension:No Has patient had a PCN reaction causing severe rash involving mucus membranes or skin necrosis:No Has patient had a PCN reaction that required hospitalization:No Has patient had a PCN reaction occurring within the last 10 years:No If all of the above answers are "NO", then may proceed  with Cephalosporin use.     Antimicrobials this admission: Zosyn 3/29>>3/31  Flagyl 3/31 >>  Ampicillin PTA >> 3/29; 3/31 >> (4/4)  Microbiology results: 2/20 Blood - enterococcus  3/31 Cdiff toxin, Ag positive  3/29 Urine - NEG   Thank you for allowing pharmacy to be a part of this patient's care.  Myrene Galas, PharmD 04/25/2015 11:41 AM

## 2015-04-25 NOTE — Progress Notes (Signed)
[facility] for heparin Indication: atrial fibrillation  Allergies  Allergen Reactions  . Feraheme [Ferumoxytol] Other (See Comments)    Chest pain, pulsating 02/17/15: tolerated Nulecit  . Vancomycin Anaphylaxis, Itching, Swelling and Other (See Comments)    Tongue swell  . Dorzolamide Hcl-Timolol Mal Rash and Other (See Comments)    Eye pain  . Gentamycin [Gentamicin Sulfate] Itching and Swelling  . Latanoprost Rash and Other (See Comments)    Eye pain  . Cefazolin Itching  . Codeine Itching  . Hydrocodone-Acetaminophen Itching  . Penicillins Itching    *tolerated ampicillin during 02/2015 admission Has patient had a PCN reaction causing immediate rash, facial/tongue/throat swelling, SOB or lightheadedness with hypotension:No Has patient had a PCN reaction causing severe rash involving mucus membranes or skin necrosis:No Has patient had a PCN reaction that required hospitalization:No Has patient had a PCN reaction occurring within the last 10 years:No If all of the above answers are "NO", then may proceed with Cephalosporin use.     Patient Measurements: Height: 5\' 1"  (154.9 cm) Weight: 139 lb 12.4 oz (63.4 kg) IBW/kg (Calculated) : 47.8 Heparin Dosing Weight: 63kg   Vital Signs: Temp: 97.2 F (36.2 C) (04/03 0831) Temp Source: Axillary (04/03 0831) BP: 108/49 mmHg (04/03 1000) Pulse Rate: 73 (04/03 1000)  Labs:  Recent Labs  04/23/15 0550  04/24/15 0016 04/24/15 0519 04/24/15 0522 04/24/15 1629 04/25/15 0451  HGB 9.1*  --   --  9.2*  --   --  9.2*  HCT 29.1*  --   --  29.7*  --   --  29.4*  PLT 174  --   --  165  --   --  144*  LABPROT 19.5*  --   --   --  20.9*  --  17.5*  INR 1.64*  --   --   --  1.80*  --  1.42  HEPARINUNFRC 0.89*  < > 0.66  --  0.53  --  0.56  CREATININE 1.06*  < >  --  0.88  --  1.00 1.18*  < > = values in this interval not displayed.  Estimated Creatinine Clearance: 40.5 mL/min (by C-G formula  based on Cr of 1.18).   Medications:  Infusions:  . sodium chloride 10 mL/hr at 04/24/15 1900  . heparin 600 Units/hr (04/23/15 2121)  . nitroGLYCERIN    . dialysis replacement fluid (prismasate) 700 mL/hr at 04/25/15 0439  . dialysis replacement fluid (prismasate) 700 mL/hr at 04/25/15 0413  . dialysate (PRISMASATE) 2,000 mL/hr at 04/25/15 [id number]    Assessment: 58 yof presented to the hospital with CP. She is on chronic coumadin for hx afib, DVT and anti-phospholipid syndrome. Coumadin was reversed and INR is now subtherapeutic so she was initiated on IV heparin. Heparin level is now therapeutic. No bleeding noted, CBC is stable. HL 0.56, hgb 9.2, plts 144, INR 1.42  Goal of Therapy:  Heparin level 0.3-0.7 units/ml Monitor platelets by anticoagulation protocol: Yes   Plan:  - Continue heparin 600 units/hr - Daily heparin level and CBC - Monitor for S&S of bleeding  Debbie [name], PharmD Pharmacy Resident Pager: [phone]  04/25/2015 11:12 AM

## 2015-04-26 ENCOUNTER — Ambulatory Visit: Payer: Self-pay | Admitting: Licensed Clinical Social Worker

## 2015-04-26 ENCOUNTER — Other Ambulatory Visit: Payer: Self-pay | Admitting: Licensed Clinical Social Worker

## 2015-04-26 DIAGNOSIS — I5042 Chronic combined systolic (congestive) and diastolic (congestive) heart failure: Secondary | ICD-10-CM

## 2015-04-26 DIAGNOSIS — D6861 Antiphospholipid syndrome: Secondary | ICD-10-CM | POA: Insufficient documentation

## 2015-04-26 DIAGNOSIS — I5021 Acute systolic (congestive) heart failure: Secondary | ICD-10-CM

## 2015-04-26 DIAGNOSIS — I272 Other secondary pulmonary hypertension: Secondary | ICD-10-CM

## 2015-04-26 DIAGNOSIS — A0472 Enterocolitis due to Clostridium difficile, not specified as recurrent: Secondary | ICD-10-CM | POA: Insufficient documentation

## 2015-04-26 DIAGNOSIS — A047 Enterocolitis due to Clostridium difficile: Secondary | ICD-10-CM

## 2015-04-26 LAB — RENAL FUNCTION PANEL
Albumin: 2 g/dL — ABNORMAL LOW (ref 3.5–5.0)
Anion gap: 15 (ref 5–15)
BUN: 21 mg/dL — AB (ref 6–20)
CALCIUM: 9.4 mg/dL (ref 8.9–10.3)
CO2: 20 mmol/L — AB (ref 22–32)
CREATININE: 1.81 mg/dL — AB (ref 0.44–1.00)
Chloride: 102 mmol/L (ref 101–111)
GFR calc non Af Amer: 28 mL/min — ABNORMAL LOW (ref >60)
GFR, EST AFRICAN AMERICAN: 33 mL/min — AB (ref >60)
Glucose, Bld: 141 mg/dL — ABNORMAL HIGH (ref 65–99)
Phosphorus: 2.6 mg/dL (ref 2.5–4.6)
Potassium: 3.8 mmol/L (ref 3.5–5.1)
SODIUM: 137 mmol/L (ref 135–145)

## 2015-04-26 LAB — CBC
HCT: 29.7 % — ABNORMAL LOW (ref 36.0–46.0)
Hemoglobin: 9.1 g/dL — ABNORMAL LOW (ref 12.0–15.0)
MCH: 28.6 pg (ref 26.0–34.0)
MCHC: 30.6 g/dL (ref 30.0–36.0)
MCV: 93.4 fL (ref 78.0–100.0)
PLATELETS: 132 10*3/uL — AB (ref 150–400)
RBC: 3.18 MIL/uL — AB (ref 3.87–5.11)
RDW: 22.3 % — AB (ref 11.5–15.5)
WBC: 23.7 10*3/uL — AB (ref 4.0–10.5)

## 2015-04-26 LAB — PROTIME-INR
INR: 1.59 — AB (ref 0.00–1.49)
Prothrombin Time: 19 seconds — ABNORMAL HIGH (ref 11.6–15.2)

## 2015-04-26 LAB — HEPARIN LEVEL (UNFRACTIONATED): Heparin Unfractionated: 0.66 IU/mL (ref 0.30–0.70)

## 2015-04-26 MED ORDER — WARFARIN - PHARMACIST DOSING INPATIENT
Freq: Every day | Status: DC
  Administered 2015-04-28: 18:00:00

## 2015-04-26 MED ORDER — WARFARIN SODIUM 1 MG PO TABS
1.0000 mg | ORAL_TABLET | Freq: Once | ORAL | Status: AC
  Administered 2015-04-26: 1 mg via ORAL
  Filled 2015-04-26: qty 1

## 2015-04-26 NOTE — Progress Notes (Addendum)
[facility] for heparin Indication: atrial fibrillation  Allergies  Allergen Reactions  . Feraheme [Ferumoxytol] Other (See Comments)    Chest pain, pulsating 02/17/15: tolerated Nulecit  . Vancomycin Anaphylaxis, Itching, Swelling and Other (See Comments)    Tongue swell  . Dorzolamide Hcl-Timolol Mal Rash and Other (See Comments)    Eye pain  . Gentamycin [Gentamicin Sulfate] Itching and Swelling  . Latanoprost Rash and Other (See Comments)    Eye pain  . Cefazolin Itching  . Codeine Itching  . Hydrocodone-Acetaminophen Itching  . Penicillins Itching    *tolerated ampicillin during 02/2015 admission Has patient had a PCN reaction causing immediate rash, facial/tongue/throat swelling, SOB or lightheadedness with hypotension:No Has patient had a PCN reaction causing severe rash involving mucus membranes or skin necrosis:No Has patient had a PCN reaction that required hospitalization:No Has patient had a PCN reaction occurring within the last 10 years:No If all of the above answers are "NO", then may proceed with Cephalosporin use.     Patient Measurements: Height: 5\' 1"  (154.9 cm) Weight: 124 lb 5.4 oz (56.4 kg) IBW/kg (Calculated) : 47.8 Heparin Dosing Weight: 63kg   Vital Signs: Temp: 98.6 F (37 C) (04/04 0341) Temp Source: Axillary (04/04 0341) BP: 111/48 mmHg (04/03 2225) Pulse Rate: 96 (04/04 0600)  Labs:  Recent Labs  04/24/15 0519 04/24/15 0522 04/24/15 1629 04/25/15 0451 04/26/15 0348 04/26/15 0349  HGB 9.2*  --   --  9.2* 9.1*  --   HCT 29.7*  --   --  29.4* 29.7*  --   PLT 165  --   --  144* 132*  --   LABPROT  --  20.9*  --  17.5*  --  19.0*  INR  --  1.80*  --  1.42  --  1.59*  HEPARINUNFRC  --  0.53  --  0.56  --  0.66  CREATININE 0.88  --  1.00 1.18* 1.81*  --     Estimated Creatinine Clearance: 23.4 mL/min (by C-G formula based on Cr of 1.81).   Medications:  Infusions:  . sodium chloride 10 mL/hr at  04/25/15 1900  . heparin 600 Units/hr (04/25/15 1900)  . nitroGLYCERIN      Assessment: 39 yof presented to the hospital with CP. She is on chronic coumadin for hx afib, DVT and anti-phospholipid syndrome. Coumadin was reversed and INR is now subtherapeutic so she was initiated on IV heparin. Heparin level is now therapeutic. No bleeding noted. HL 0.66, hgb 9.1, plts 132, INR 1.59  Goal of Therapy:  Heparin level 0.3-0.7 units/ml Monitor platelets by anticoagulation protocol: Yes   Plan:  - Continue heparin 600 units/hr - Daily heparin level and CBC - Monitor for S&S of bleeding  Angela Burke, PharmD Pharmacy Resident Pager: [phone]  04/26/2015 7:36 AM ___________________________________________________ ADDENDUM:  Pharmacy consulted to restart warfarin from PTA for DVT/antiphospholipid syndrome.   HL 0.66, hgb 9.1, plts 132, INR 1.59 - vit K 10mg  IV on 3/29  PTA warfarin dose: 1mg  daily except 0.5 mg on MF  Pt on FLAGYL which can increase anticoag effect of warfarin. Will make adjustments accordingly.   Plan: Warfarin 1mg  po x 1 tonight Daily INR/CBC Monitor for S&S of bleed Should continue heparin until INR therapeutic

## 2015-04-26 NOTE — Patient Outreach (Signed)
[facility]) Care Management  04/26/2015  Debbie [name] 24-Dec-1950 [id number]   Assessment-CSW completed chart review. Patient is still in hospital. CSW completed outreach to both patient's daughter. CSW left a HIPPA compliant voice message for Debbie [name]. CSW completed call to patient's daughter Debbie [name] and was able to successfully reach her. CSW introduced self, reason for call and of [facility]. Daughter remembers [facility] previously working with patient in the past. At this time, family is not agreeable to patient returning to a SNF as they were not pleased with the care she received at last facility. Debbie [name] states that they feel comfortable with patient returning home with [facility]. CSW will continue to follow case.   Plan-CSW will update RNCM. CSW will await for hospital discharge then will schedule for home visit.  [name], BSW, MSW, [email or web address] Phone: [phone] Fax: [phone]

## 2015-04-26 NOTE — Progress Notes (Signed)
S: CO knee pain O:BP 111/48 mmHg  Pulse 96  Temp(Src) 98.6 F (37 C) (Axillary)  Resp 23  Ht 5\' 1"  (1.549 m)  Wt 56.4 kg (124 lb 5.4 oz)  BMI 23.51 kg/m2  SpO2 99%  LMP  (LMP Unknown)  Intake/Output Summary (Last 24 hours) at 04/26/15 0744 Last data filed at 04/26/15 0600  Gross per 24 hour  Intake    990 ml  Output   1286 ml  Net   -296 ml   Weight change: -7 kg (-15 lb 6.9 oz) [id number] and alert CVS:RRR Resp: basilar crackles Abd:+ BS NTND Ext: no edema RUA AVG + bruit NEURO:Follows commands Lt PICC line Rt Fem HD cath   . allopurinol  200 mg Oral Daily  . ampicillin (OMNIPEN) IV  2 g Intravenous BID  . antiseptic oral rinse  7 mL Mouth Rinse BID  . aspirin  81 mg Oral Daily  . atorvastatin  40 mg Oral q1800  . calcitRIOL  0.25 mcg Oral QODAY  . calcitRIOL  0.5 mcg Oral QODAY  . darbepoetin (ARANESP) injection - NON-DIALYSIS  200 mcg Subcutaneous Q Tue-1800  . ferric gluconate (FERRLECIT/NULECIT) IV  125 mg Intravenous Daily  . hydrocortisone sod succinate (SOLU-CORTEF) inj  25 mg Intravenous Daily  . latanoprost  1 drop Both Eyes QHS  . metoprolol tartrate  12.5 mg Oral BID  . metronidazole  500 mg Intravenous Q8H  . multivitamin  1 tablet Oral QHS  . mycophenolate  180 mg Oral BID  . sodium chloride flush  10 mL Intravenous Q12H  . tacrolimus  2 mg Oral BID  . timolol  1 drop Both Eyes BID   No results found. BMET    Component Value Date/Time   NA 137 04/26/2015 0348   K 3.8 04/26/2015 0348   CL 102 04/26/2015 0348   CO2 20* 04/26/2015 0348   GLUCOSE 141* 04/26/2015 0348   BUN 21* 04/26/2015 0348   CREATININE 1.81* 04/26/2015 0348   CALCIUM 9.4 04/26/2015 0348   GFRNONAA 28* 04/26/2015 0348   GFRAA 33* 04/26/2015 0348   CBC    Component Value Date/Time   WBC 23.7* 04/26/2015 0348   RBC 3.18* 04/26/2015 0348   RBC 3.17* 02/15/2015 0527   HGB 9.1* 04/26/2015 0348   HCT 29.7* 04/26/2015 0348   PLT 132* 04/26/2015 0348   MCV 93.4 04/26/2015  0348   MCH 28.6 04/26/2015 0348   MCHC 30.6 04/26/2015 0348   RDW 22.3* 04/26/2015 0348   LYMPHSABS 3.0 04/03/2015 0912   MONOABS 1.0 04/13/2015 0912   EOSABS 0.6 04/15/2015 0912   BASOSABS 0.0 04/17/2015 0912     Assessment: 1.  Acute on CKD 4 in renal Tx, UO remains minimal  2. Anemia on aranesp 3.  Sec HPTH on calcitriol 4. Acute Resp failure, improving 5. Mild thrombocytopenia  Plan: 1. Plan HD tomorrow and if AVG works well then will DC fem cath 2. I would change steroids to her usual oral dose   Hien Perreira T

## 2015-04-26 NOTE — Progress Notes (Signed)
SUBJECTIVE: Pt denies any chest pain or SOB. A little more alert.   BP 111/48 mmHg  Pulse 88  Temp(Src) 98.4 F (36.9 C) (Axillary)  Resp 18  Ht 5\' 1"  (1.549 m)  Wt 56.4 kg (124 lb 5.4 oz)  BMI 23.51 kg/m2  SpO2 100%  LMP  (LMP Unknown)  Intake/Output Summary (Last 24 hours) at 04/26/15 0925 Last data filed at 04/26/15 0800  Gross per 24 hour  Intake   1162 ml  Output   1158 ml  Net      4 ml    PHYSICAL EXAM General: Well developed, frail, chronically ill appearing in no acute distress.  HEENT: normal  Neck: No JVD.  Lungs: CTA anteriorly Heart: RRR, no gallop, murmur, or rub Abdomen: Soft, not distended Extremities: No lower extremity edema. RUE edematous.   LABS: Basic Metabolic Panel:  Recent Labs  04/24/15 0519  04/25/15 0451 04/26/15 0348  NA 140  < > 140 137  K 4.3  < > 4.0 3.8  CL 106  < > 107 102  CO2 21*  < > 20* 20*  GLUCOSE 142*  < > 104* 141*  BUN 9  < > 11 21*  CREATININE 0.88  < > 1.18* 1.81*  CALCIUM 9.0  < > 9.3 9.4  MG 2.6*  --  2.7*  --   PHOS 2.6  < > 2.4* 2.6  < > = values in this interval not displayed. CBC:  Recent Labs  04/25/15 0451 04/26/15 0348  WBC 20.8* 23.7*  HGB 9.2* 9.1*  HCT 29.4* 29.7*  MCV 92.2 93.4  PLT 144* 132*    Current Meds: . allopurinol  200 mg Oral Daily  . ampicillin (OMNIPEN) IV  2 g Intravenous BID  . antiseptic oral rinse  7 mL Mouth Rinse BID  . aspirin  81 mg Oral Daily  . atorvastatin  40 mg Oral q1800  . calcitRIOL  0.25 mcg Oral QODAY  . calcitRIOL  0.5 mcg Oral QODAY  . darbepoetin (ARANESP) injection - NON-DIALYSIS  200 mcg Subcutaneous Q Tue-1800  . ferric gluconate (FERRLECIT/NULECIT) IV  125 mg Intravenous Daily  . hydrocortisone sod succinate (SOLU-CORTEF) inj  25 mg Intravenous Daily  . latanoprost  1 drop Both Eyes QHS  . metoprolol tartrate  12.5 mg Oral BID  . metronidazole  500 mg Intravenous Q8H  . multivitamin  1 tablet Oral QHS  . mycophenolate  180 mg Oral BID  .  sodium chloride flush  10 mL Intravenous Q12H  . tacrolimus  2 mg Oral BID  . timolol  1 drop Both Eyes BID     ASSESSMENT AND PLAN:  1. NSTEMI: Pt admitted 03/26/2015 with chest pain and dyspnea with volume overload in setting of acute on chronic renal failure and severe anemia and found to have mildly elevated troponin. Peak 0.95. New wall motion abnormality on echo and EF decreased to 25-30%. In the past she has not been considered a good candidate for cardiac cath due to advanced renal disease, frailty, and anemia.  Continue statin and lopressor; add ASA. Will need to clarify goals of care as condition improves prior to performing invasive evaluation. For the above reasons I would favor continued medical therapy with ASA, statin, Imdur, and beta blocker. If she has recurrent angina we can re-evaluate.   2. Cardiomyopathy/Acute on Chronic Diastolic CHF: Echo now with new wall motion abnormality and LVEF=25-30%. EF was normal in February 2017. Volume management  per dialysis. May need IHD. Would consider repeating Echo in 4-6 weeks once volume status corrected and she is on stable medical therapy.  3. 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   4. Anti-phospholipid antibody syndrome/ history of DVT's: She is on chronic coumadin but this has been held recently due to supra- therapeutic INR and anemia. INR is now under 2. Continue heparin. OK to resume Coumadin from my standpoint.   5. History of PAF: Continue beta blocker. Pt in sinus at present. This patients CHA2DS2-VASc Score and unadjusted Ischemic Stroke Rate (% per year) is equal to 7.2 % stroke rate/year from a score of 5 (CHF, HTN, Vascular, Age, Female). Continue heparin. Resume coumadin when OK with medical team.   Debbie [name]   4/4/20179:25 AM

## 2015-04-26 NOTE — NC FL2 (Deleted)
[place] LEVEL OF CARE SCREENING TOOL     IDENTIFICATION  Patient Name: Debbie [name] Birthdate: May 16, 1950 Sex: female Admission Date (Current Location): 03/26/2015  Operating Room Services and [place] Number:  Herbalist and Address:  [facility], [place] [address] [place], [place] [address]      Provider Number: [id number]  Attending Physician Name and Address:  Allie Bossier, MD  Relative Name and Phone Number:       Current Level of Care: Hospital Recommended Level of Care: [place] Prior Approval Number:    Date Approved/Denied:   PASRR Number: [id number]  Discharge Plan: SNF    Current Diagnoses: Patient Active Problem List   Diagnosis Date Noted  . Acute respiratory failure with hypoxia ([place])   . Palliative care encounter   . Hypotension 04/17/2015  . PAF (paroxysmal atrial fibrillation) ([place]) 03/30/2015  . Acute on chronic diastolic CHF (congestive heart failure), NYHA class 1 ([place]) 04/22/2015  . Wheezing 03/28/2015  . Acute GI bleeding 03/25/2015  . Paroxysmal atrial fibrillation (HCC)   . Acute on chronic diastolic (congestive) heart failure ([place])   . Anemia 04/06/2015  . Acute congestive heart failure ([place]) 04/06/2015  . Effusion of shoulder joint   . Pyogenic arthritis of right shoulder region ([place])   . Arm pain, right 03/17/2015  . Enterococcal bacteremia   . Bacteremia   . Renal transplant recipient 03/14/2015  . Coagulopathy ([place]) 03/14/2015  . ARF (acute renal failure) ([place]) 03/14/2015  . Acute renal failure superimposed on stage 4 chronic kidney disease ([place]) 03/14/2015  . Cardiomyopathy, ischemic-40-45% 03/14/2015  . Supratherapeutic INR 03/14/2015  . LBBB (left bundle branch block) 03/14/2015  . Anemia of chronic kidney failure 03/14/2015  . Hypokalemia   . Hyperkalemia   . NSTEMI (non-ST elevated myocardial infarction) ([place])   . Anemia in CKD (chronic kidney disease)   . Acute on chronic  renal failure ([place]) 02/15/2015  . Acute systolic heart failure ([place]) 02/15/2015  . Absolute anemia   . Acute renal failure superimposed on stage 3 chronic kidney disease ([place]) 02/14/2015  . Dehydration 02/14/2015  . Chest pain 02/14/2015  . Pulmonary hypertension ([place]) 09/29/2013  . AKI (acute kidney injury) ([place]) 08/29/2013  . Dyslipidemia 07/22/2013  . Carotid bruit 07/22/2013  . Chronic anticoagulation - with Coumadin 07/06/2013  . Stable angina ([place]) 06/30/2013  . HTN (hypertension) 06/30/2013  . Diarrhea 06/21/2011  . Neutropenic fever ([place]) 04/17/2011  . Hyponatremia 04/17/2011  . CKD (chronic kidney disease), stage IV ([place]) 04/17/2011  . Kidney transplant as cause of abnormal reaction or later complication     Orientation RESPIRATION BLADDER Height & Weight     Time, Self, Place  O2 (4L [place]) Indwelling catheter Weight: 124 lb 5.4 oz (56.4 kg) Height:  5\' 1"  (154.9 cm)  BEHAVIORAL SYMPTOMS/MOOD NEUROLOGICAL BOWEL NUTRITION STATUS      Incontinent Diet (renal/carb modified with fluid restriction )  AMBULATORY STATUS COMMUNICATION OF NEEDS Skin   Extensive Assist Verbally PU Stage and Appropriate Care, Other (Comment) (buttocks left bleeding ulcer no dressing; buttocks bilateral posterior medial looks like a skin tear no dressing; coccyx mid bloody ulcer no dressing )                       Personal Care Assistance Level of Assistance    Bathing Assistance: Limited assistance Feeding assistance: Limited assistance Dressing Assistance: Limited assistance     Functional Limitations  Info  Sight, Hearing, Speech Sight Info: Adequate Hearing Info: Adequate Speech Info: Adequate (delayed speech)    SPECIAL CARE FACTORS FREQUENCY  PT (By licensed PT), OT (By licensed OT)                    Contractures Contractures Info: Not present    Additional Factors Info  Code Status, Allergies, Isolation Precautions Code Status Info: PARTIAL Allergies Info: Feraheme,  Vancomycin, Dorzolamide Hcl-timolol Mal, Gentamycin, Latanoprost, Cefazolin, Codeine, Hydrocodone-acetaminophen, Penicillins     Isolation Precautions Info: Enteric Precautions (UV disinfection)     Current Medications (04/26/2015):  This is the current hospital active medication list Current Facility-Administered Medications  Medication Dose Route Frequency Provider Last Rate Last Dose  . 0.9 %  sodium chloride infusion   Intravenous Continuous Mauricia Area, MD 10 mL/hr at 04/25/15 1900    . acetaminophen (TYLENOL) tablet 650 mg  650 mg Oral Q6H PRN Waldemar Dickens, MD       Or  . acetaminophen (TYLENOL) suppository 650 mg  650 mg Rectal Q6H PRN Waldemar Dickens, MD      . allopurinol (ZYLOPRIM) tablet 200 mg  200 mg Oral Daily Mauricia Area, MD   200 mg at 04/25/15 1034  . ampicillin (OMNIPEN) 2 g in sodium chloride 0.9 % 50 mL IVPB  2 g Intravenous BID Jose Shirl Harris, MD   2 g at 04/26/15 0352  . antiseptic oral rinse (CPC / CETYLPYRIDINIUM CHLORIDE 0.05%) solution 7 mL  7 mL Mouth Rinse BID Rush Farmer, MD   7 mL at 04/26/15 1000  . aspirin chewable tablet 81 mg  81 mg Oral Daily Lelon Perla, MD   81 mg at 04/25/15 1034  . atorvastatin (LIPITOR) tablet 40 mg  40 mg Oral q1800 Waldemar Dickens, MD   40 mg at 04/25/15 1800  . calcitRIOL (ROCALTROL) capsule 0.25 mcg  0.25 mcg Oral QODAY Waldemar Dickens, MD   0.25 mcg at 04/25/15 1035  . calcitRIOL (ROCALTROL) capsule 0.5 mcg  0.5 mcg Oral Marline Backbone, MD   0.5 mcg at 04/24/15 0935  . Darbepoetin Alfa (ARANESP) injection 200 mcg  200 mcg Subcutaneous Q Tue-1800 Mauricia Area, MD      . feeding supplement (NEPRO CARB STEADY) liquid 237 mL  237 mL Oral TID PRN [place], MD      . fentaNYL (SUBLIMAZE) injection 12.5 mcg  12.5 mcg Intravenous Q2H PRN Erick Colace, NP   12.5 mcg at 04/24/15 1927  . ferric gluconate (NULECIT) 125 mg in sodium chloride 0.9 % 100 mL IVPB  125 mg Intravenous Daily Mauricia Area, MD   125 mg at 04/25/15 1000  . Gerhardt's butt cream   Topical TID PRN Skeet Simmer, [facility]   1 application at [id number]  . heparin ADULT infusion 100 units/mL (25000 units/250 mL)  600 Units/hr Intravenous Continuous Rachel L Rumbarger, RPH 6 mL/hr at 04/26/15 0800 600 Units/hr at 04/26/15 0800  . hydrocortisone sodium succinate (SOLU-CORTEF) 100 MG injection 25 mg  25 mg Intravenous Daily Raylene Miyamoto, MD   25 mg at 04/26/15 0913  . latanoprost (XALATAN) 0.005 % ophthalmic solution 1 drop  1 drop Both Eyes QHS Waldemar Dickens, MD   1 drop at 04/25/15 2232  . levalbuterol (XOPENEX) nebulizer solution 1.25 mg  1.25 mg Nebulization Q6H PRN Waldemar Dickens, MD      . metoprolol tartrate (  LOPRESSOR) tablet 12.5 mg  12.5 mg Oral BID Burnell Blanks, MD   12.5 mg at 04/25/15 2225  . metroNIDAZOLE (FLAGYL) IVPB 500 mg  500 mg Intravenous Q8H Corey Harold, NP   500 mg at 04/26/15 0917  . morphine 2 MG/ML injection 2 mg  2 mg Intravenous Q1H PRN Raylene Miyamoto, MD   2 mg at 04/24/15 0126  . multivitamin (RENA-VIT) tablet 1 tablet  1 tablet Oral QHS Mauricia Area, MD   1 tablet at 04/25/15 2225  . mycophenolate (MYFORTIC) EC tablet 180 mg  180 mg Oral BID Waldemar Dickens, MD   180 mg at 04/25/15 2226  . nitroGLYCERIN (NITROSTAT) SL tablet 0.4 mg  0.4 mg Sublingual Q5 min PRN Margaretann Loveless, MD   0.4 mg at 04/19/15 0710  . nitroGLYCERIN 50 mg in dextrose 5 % 250 mL (0.2 mg/mL) infusion  0-200 mcg/min Intravenous Continuous Waldemar Dickens, MD   0 mcg/min at 04/21/2015 1515  . ondansetron (ZOFRAN) tablet 4 mg  4 mg Oral Q6H PRN Waldemar Dickens, MD       Or  . ondansetron [facility]) injection 4 mg  4 mg Intravenous Q6H PRN Waldemar Dickens, MD      . oxyCODONE-acetaminophen (PERCOCET/ROXICET) 5-325 MG per tablet 0.5-1 tablet  0.5-1 tablet Oral Q6H PRN Waldemar Dickens, MD      . sodium chloride flush (NS) 0.9 % injection 10 mL  10 mL Intravenous Q12H Mauricia Area, MD   10 mL at  04/26/15 0920  . tacrolimus (PROGRAF) capsule 2 mg  2 mg Oral BID Waldemar Dickens, MD   2 mg at 04/25/15 2226  . timolol (TIMOPTIC) 0.5 % ophthalmic solution 1 drop  1 drop Both Eyes BID Waldemar Dickens, MD   1 drop at 04/26/15 0919  . warfarin (COUMADIN) tablet 1 mg  1 mg Oral ONCE-1800 Myrene Galas, [facility]      . Warfarin - Pharmacist Dosing Inpatient   Does not apply [place], [facility]         Discharge Medications: Please see discharge summary for a list of discharge medications.  Relevant Imaging Results:  Relevant Lab Results:   Additional Information SS [id number]  Judeth Horn, LCSW

## 2015-04-26 NOTE — Progress Notes (Signed)
[facility] PROGRESS NOTE  Debbie [name] [id number] DOB: 09/05/1950 DOA: 04/14/2015 PCP: No PCP Per Patient Admit HPI / Brief Narrative: Debbie [name] is a 65 y.o. WF PMHx Pulmonary HTN, CAD native artery, Heart Murmur, Paroxysmal A-Fib, esophageal stricture, Cholelithiasis, Colon polyps, ESRD S/P kidney transplant, Antiphospholipid Syndrome, LLE DVT,   Chest pain, midsternal to left chest. Intermittent since being discharged from hospital 03/28/2015. Comes on acutely. Comes on at rest but also with exertion. Patient states that this chest pain feels like her previous heart attack chest pain. Improved with nitroglycerin and aspirin. Associated with intermittent but progressive lower extremity swelling since last admission. Patient also states that she started wheezing since presenting here to the emergency room. Patient reports crampy abdominal pain which came on approximately 1 day ago associated with very dark loose stools which, with greater frequency. Continues her IV antibiotics for recent bacteremia.  Multifactorial Respiratory Failure in setting of suspected NSTEMI complicated by acute on chronic renal failure and combined acute on chronicCHF / Anemia. Need for HD cath placement and CVVHD/ transfer to ICU and CVVHD to address volume overload. Long discussion with patient and family re: Change in code status to LCB to allow for pressors for hypotension with CVVHD. No intubation, No CPR, no defib, but pressors for BP and chemical code only. She looks better w volume removal. When filter clots she can leave the ICU as will plan for transition to IHD  HPI/Subjective: 4/4 afebrile overnight. Patient responds only to painful stimuli will state"get your hands off me that hurts", will not answer any questions nor follow commands. Per RN staff this has been her baseline for the last couple of days.    Assessment/Plan: Respiratory Failure /BiPAP in setting of Volume overload 2/2 RF and  CHF -Strict in and out since admission since admission -16 L -Daily weight Filed Weights   04/24/15 0500 04/25/15 0500 04/26/15 0600  Weight: 62.4 kg (137 lb 9.1 oz) 63.4 kg (139 lb 12.4 oz) 56.4 kg (124 lb 5.4 oz)   Acute on chronic combined systolic/diastolic CHF -Metoprolol AB-[id number] mg BID -Volume management per Nephrology; patient on HD -Aspirin 81 mg daily -Pressors for MAP of 60 > weaned off  Severe secondary PAH -See acute on chronic combined systolic and diastolic CHF  NSTEMI ( mildly elevated troponin)/Hypotension -Not a cath candidate per cards until HD and correction of anemia -Hypotension has resolved  PAF -Currently in NSR  Acute on Chronic stage 4 renal failure/S/P Renal Transplant CVVHD until today > possible transition to IHD if needed.  Cont supportive care  Renal following pt.   Anti-Phospholipid antibody syndrome/ History DVT's Elevated INR ( 3.47) Anemia No evidence of bleeding at present Restarted heparin drip (dvt h/o, fib) to bridge as we await therapeutic INR Trend cbc   C.diff colitis  Cont amp; complete 8d (day 4/8 abx) Flagyl IV; cont X 7d after stops abx    Hypotensive  - Adrenal Suppression in a pt on chronic steroids -Continue to attempt to titrate down Hydrocortisone currently at when 25mg  daily -Patient on prednisone 5 mg daily at home   Delirium  -Baseline unknown, palliative care to meet with son today     Code Status: No CPR, no defib, but pressors for BP and chemical code only Family Communication: None Disposition Plan: Will await palliative care input   Consultants: Dr Peter M Martinique,. Cardiology [place] nephrology Dr.Wesam Kathryne [facility] CM    Procedures: Echo: EF: 55-60% Moderate pulmonary HTN:PA  peak pressure: 64 mm Hg (S). Mild MV stenosis Pending repeat Echo done 04/19/15  03/22/15 TEE No evidence vegetation Echo 3/29 echo>>>25% to 30%. Doppler parameters are consistent with both elevated  ventricular end-diastolic filling pressure and elevated left atrial filling pressure, PA 72 04/19/2015: CXR Significant worsening of severe fluffy parahilar opacities throughout both lungs, favor worsening severe pulmonary edema / ARDS.Stable trace bilateral pleural effusions. 3/29 transfer to icu for CVVH  Cultures NA  Antibiotics: Ampicillin 03/23/2015>>> Flagyl (vanc allergy) 3/31>>>   DVT prophylaxis     Objective: Filed Vitals:   04/26/15 0500 04/26/15 0600 04/26/15 0700 04/26/15 0800  BP:      Pulse: 91 96 89 88  Temp:      TempSrc:      Resp: 15 23 20 18   Height:      Weight:  56.4 kg (124 lb 5.4 oz)    SpO2: 100% 99% 100% 100%    Intake/Output Summary (Last 24 hours) at 04/26/15 0824 Last data filed at 04/26/15 0800  Gross per 24 hour  Intake   1188 ml  Output   1329 ml  Net   -141 ml   Filed Weights   04/24/15 0500 04/25/15 0500 04/26/15 0600  Weight: 62.4 kg (137 lb 9.1 oz) 63.4 kg (139 lb 12.4 oz) 56.4 kg (124 lb 5.4 oz)     Exam: General: Alert when stimulated Patient responds only to painful stimuli will state"get your hands off me that hurts",, No acute respiratory distress Eyes: Negative headache, double vision,negative scleral hemorrhage ENT: Negative Runny nose, negative gingival bleeding, Neck:  Negative scars, masses, torticollis, lymphadenopathy, JVD Lungs: Clear to auscultation bilaterally without wheezes or crackles Cardiovascular: Regular rate and rhythm without murmur gallop or rub normal S1 and S2 Abdomen:negative abdominal pain, negative dysphagia, nondistended, positive soft, bowel sounds, no rebound, no ascites, no appreciable mass, PEG tube in place negative sign of infection Extremities: No significant cyanosis, clubbing, or edema bilateral lower extremities Psychiatric:  Unable to assess secondary to patient not cooperating   Neurologic:  Unable to assess secondary to patient not cooperating    Data Reviewed: Basic Metabolic  Panel:  Recent Labs Lab 04/21/15 0350  04/22/15 0357  04/23/15 0550 04/23/15 1600 04/24/15 0519 04/24/15 1629 04/25/15 0451 04/26/15 0348  NA 141  < > 138  < > 139 138 140 139 140 137  K 3.8  < > 3.8  < > 3.7 4.1 4.3 4.2 4.0 3.8  CL 105  < > 104  < > 105 106 106 107 107 102  CO2 23  < > 22  < > 23 20* 21* 20* 20* 20*  GLUCOSE 121*  < > 119*  < > 114* 163* 142* 118* 104* 141*  BUN 34*  < > 14  < > 12 9 9 9 11  21*  CREATININE 2.09*  < > 1.05*  < > 1.06* 1.00 0.88 1.00 1.18* 1.81*  CALCIUM 8.9  < > 8.8*  < > 8.9 8.5* 9.0 9.3 9.3 9.4  MG 2.3  --  2.4  --  2.4  --  2.6*  --  2.7*  --   PHOS 3.0  < > 2.1*  < > 2.8 2.5 2.6 2.5 2.4* 2.6  < > = values in this interval not displayed. Liver Function Tests:  Recent Labs Lab 04/23/15 1600 04/24/15 0519 04/24/15 1629 04/25/15 0451 04/26/15 0348  ALBUMIN 1.9* 2.1* 2.2* 2.1* 2.0*   No results for input(s): LIPASE, AMYLASE in  the last 168 hours. No results for input(s): AMMONIA in the last 168 hours. CBC:  Recent Labs Lab 04/22/15 0357 04/23/15 0550 04/24/15 0519 04/25/15 0451 04/26/15 0348  WBC 15.5* 12.6* 16.8* 20.8* 23.7*  HGB 9.3* 9.1* 9.2* 9.2* 9.1*  HCT 29.9* 29.1* 29.7* 29.4* 29.7*  MCV 90.1 90.1 90.8 92.2 93.4  PLT 187 174 165 144* 132*   Cardiac Enzymes: No results for input(s): CKTOTAL, CKMB, CKMBINDEX, TROPONINI in the last 168 hours. BNP (last 3 results)  Recent Labs  04/06/15 1200 03/28/2015 0922 04/19/15 0400  BNP 717.5* 1333.7* 2795.1*    ProBNP (last 3 results) No results for input(s): PROBNP in the last 8760 hours.  CBG:  Recent Labs Lab 04/20/15 1052 04/21/15 0319 04/22/15 1737  GLUCAP 128* 120* 132*    Recent Results (from the past 240 hour(s))  Culture, Urine     Status: None   Collection Time: 04/20/15  1:40 PM  Result Value Ref Range Status   Specimen Description URINE, CATHETERIZED  Final   Special Requests Ampicillin Immunocompromised  Final   Culture NO GROWTH 1 DAY  Final    Report Status 04/21/2015 FINAL  Final  C difficile quick scan w PCR reflex     Status: Abnormal   Collection Time: 04/21/15  3:36 PM  Result Value Ref Range Status   C Diff antigen POSITIVE (A) NEGATIVE Final   C Diff toxin POSITIVE (A) NEGATIVE Final   C Diff interpretation Positive for toxigenic C. difficile  Final    Comment: CRITICAL RESULT CALLED TO, READ BACK BY AND VERIFIED WITH: Edgardo Roys RN 16:50 04/21/15 ([email or web address])      Studies: No results found.  Scheduled Meds: . allopurinol  200 mg Oral Daily  . ampicillin (OMNIPEN) IV  2 g Intravenous BID  . antiseptic oral rinse  7 mL Mouth Rinse BID  . aspirin  81 mg Oral Daily  . atorvastatin  40 mg Oral q1800  . calcitRIOL  0.25 mcg Oral QODAY  . calcitRIOL  0.5 mcg Oral QODAY  . darbepoetin (ARANESP) injection - NON-DIALYSIS  200 mcg Subcutaneous Q Tue-1800  . ferric gluconate (FERRLECIT/NULECIT) IV  125 mg Intravenous Daily  . hydrocortisone sod succinate (SOLU-CORTEF) inj  25 mg Intravenous Daily  . latanoprost  1 drop Both Eyes QHS  . metoprolol tartrate  12.5 mg Oral BID  . metronidazole  500 mg Intravenous Q8H  . multivitamin  1 tablet Oral QHS  . mycophenolate  180 mg Oral BID  . sodium chloride flush  10 mL Intravenous Q12H  . tacrolimus  2 mg Oral BID  . timolol  1 drop Both Eyes BID   Continuous Infusions: . sodium chloride 10 mL/hr at 04/25/15 1900  . heparin 600 Units/hr (04/26/15 0800)  . nitroGLYCERIN      Active Problems:   CKD (chronic kidney disease), stage IV (HCC)   HTN (hypertension)   Renal transplant recipient   Acute renal failure superimposed on stage 4 chronic kidney disease (HCC)   Supratherapeutic INR   Enterococcal bacteremia   Hypotension   PAF (paroxysmal atrial fibrillation) (HCC)   Acute on chronic diastolic CHF (congestive heart failure), NYHA class 1 (HCC)   Wheezing   Acute GI bleeding   Palliative care encounter   Acute respiratory failure with hypoxia ([place])    Time spent:  45 minutes    WOODS, [facility] Pager [phone]. If 7PM-7AM, please contact night-coverage at [email or web address], password [facility] 04/26/2015, 8:24 AM  LOS: 8 days    Care during the described time interval was provided by me .  I have reviewed this patient's available data, including medical history, events of note, physical examination, and all test results as part of my evaluation. I have personally reviewed and interpreted all radiology studies.   Dia Crawford, MD [phone] Pager

## 2015-04-26 NOTE — Clinical Social Work Note (Signed)
Clinical Social Work Assessment  Patient Details  Name: Debbie [name] MRN: [id number] Date of Birth: Jun 01, 1950  Date of referral:  04/26/15               Reason for consult:  Discharge Planning, Facility Placement                Permission sought to share information with:  Case Manager, Family Supports Permission granted to share information::  Yes, Verbal Permission Granted  Name::     Debbie [name] (Daughter) [phone]; Debbie [name] (Daughter) [phone]  Agency::     Relationship::     Contact Information:     Housing/Transportation Living arrangements for the past 2 months:  [place], [place] of Information:  Adult Children Patient Interpreter Needed:  None Criminal Activity/Legal Involvement Pertinent to Current Situation/Hospitalization:  No - Comment as needed Significant Relationships:  Adult Children Lives with:  Adult Children Do you feel safe going back to the place where you live?  Yes Need for family participation in patient care:  Yes (Comment)  Care giving concerns:  Patient's daughters report that they were very dissatisfied with care at previous SNF. Patient and daughters not agreeable to skilled nursing facility placement at this time and are requesting that Patient return home with previous home health agency, [place] [facility].    Social Worker assessment / plan:  Patient is a 65 YO African American female who was admitted with Multifactorial Respiratory Failure in setting of suspected NSTEMI complicated by acute on chronic renal failure and combined acute on chronicCHF / Anemia. Patient will not answer any questions nor follow commands, therefore, Patient's daughters were consulted. CSW introduced self, role of CSW, and began discussion of discharge planning. Patient is from [place] [facility]. Patient's daughters report that they were very dissatisfied with care at previous SNF. Patient and daughters not  agreeable to skilled nursing facility placement at this time and are requesting that Patient return home with previous home health agency, [place]. CSW discussed the benefits of SNF placement and the recommendation as provided by physical therapist. CSW will staff with RN CM. CSW will continue to follow for disposition.   Employment status:  Retired Forensic scientist:  Medicare PT Recommendations:  Not assessed at this time Information / Referral to community resources:  [place]  Patient/Family's Response to care:  Patient and daughters not agreeable to skilled nursing facility placement at this time and are requesting that Patient return home with previous home health agency, [facility].    Patient/Family's Understanding of and Emotional Response to Diagnosis, Current Treatment, and Prognosis:  Patient and family report understanding of current diagnosis and treatment.   Emotional Assessment Appearance:  Appears stated age Attitude/Demeanor/Rapport:  Unable to Assess Affect (typically observed):  Unable to Assess Orientation:  Oriented to Place, Oriented to Self, Oriented to  Time Alcohol / Substance use:  Tobacco Use Psych involvement (Current and /or in the community):  No (Comment)  Discharge Needs  Concerns to be addressed:  Discharge Planning Concerns, Care Coordination Readmission within the last 30 days:  Yes Current discharge risk:  None Barriers to Discharge:  No Barriers Identified   Judeth Horn, LCSW 04/26/2015, 2:31 PM

## 2015-04-26 NOTE — Progress Notes (Signed)
Patient is from [place]. Plan is to return when medically cleared for discharge.   PASRR # is: [id number]  Holly Bodily, MSW, LCSW [facility] ED/ 65M Clinical Social Worker [phone]

## 2015-04-27 LAB — RENAL FUNCTION PANEL
Albumin: 2 g/dL — ABNORMAL LOW (ref 3.5–5.0)
Anion gap: 15 (ref 5–15)
BUN: 32 mg/dL — AB (ref 6–20)
CHLORIDE: 106 mmol/L (ref 101–111)
CO2: 20 mmol/L — ABNORMAL LOW (ref 22–32)
Calcium: 9.7 mg/dL (ref 8.9–10.3)
Creatinine, Ser: 2.82 mg/dL — ABNORMAL HIGH (ref 0.44–1.00)
GFR calc Af Amer: 19 mL/min — ABNORMAL LOW (ref >60)
GFR, EST NON AFRICAN AMERICAN: 17 mL/min — AB (ref >60)
Glucose, Bld: 203 mg/dL — ABNORMAL HIGH (ref 65–99)
POTASSIUM: 3.2 mmol/L — AB (ref 3.5–5.1)
Phosphorus: 2.2 mg/dL — ABNORMAL LOW (ref 2.5–4.6)
Sodium: 141 mmol/L (ref 135–145)

## 2015-04-27 LAB — CBC
HEMATOCRIT: 30.7 % — AB (ref 36.0–46.0)
Hemoglobin: 9.3 g/dL — ABNORMAL LOW (ref 12.0–15.0)
MCH: 29.1 pg (ref 26.0–34.0)
MCHC: 30.3 g/dL (ref 30.0–36.0)
MCV: 95.9 fL (ref 78.0–100.0)
Platelets: 120 10*3/uL — ABNORMAL LOW (ref 150–400)
RBC: 3.2 MIL/uL — ABNORMAL LOW (ref 3.87–5.11)
RDW: 24 % — AB (ref 11.5–15.5)
WBC: 19.2 10*3/uL — AB (ref 4.0–10.5)

## 2015-04-27 LAB — HEPARIN LEVEL (UNFRACTIONATED): Heparin Unfractionated: 0.62 IU/mL (ref 0.30–0.70)

## 2015-04-27 LAB — PROTIME-INR
INR: 1.45 (ref 0.00–1.49)
PROTHROMBIN TIME: 17.7 s — AB (ref 11.6–15.2)

## 2015-04-27 MED ORDER — WARFARIN SODIUM 1 MG PO TABS
1.0000 mg | ORAL_TABLET | Freq: Once | ORAL | Status: DC
  Filled 2015-04-27: qty 1

## 2015-04-27 MED ORDER — WARFARIN SODIUM 1 MG PO TABS
1.0000 mg | ORAL_TABLET | Freq: Once | ORAL | Status: AC
  Administered 2015-04-27: 1 mg via ORAL
  Filled 2015-04-27: qty 1

## 2015-04-27 MED ORDER — PANTOPRAZOLE SODIUM 40 MG PO TBEC
40.0000 mg | DELAYED_RELEASE_TABLET | Freq: Every day | ORAL | Status: DC
  Administered 2015-04-27 – 2015-04-28 (×2): 40 mg via ORAL
  Filled 2015-04-27 (×3): qty 1

## 2015-04-27 MED ORDER — PREDNISONE 10 MG PO TABS
5.0000 mg | ORAL_TABLET | Freq: Every day | ORAL | Status: DC
  Administered 2015-04-28: 5 mg via ORAL
  Filled 2015-04-27 (×2): qty 1

## 2015-04-27 MED ORDER — MIDODRINE HCL 5 MG PO TABS
10.0000 mg | ORAL_TABLET | Freq: Three times a day (TID) | ORAL | Status: DC
  Administered 2015-04-28 – 2015-04-29 (×4): 10 mg via ORAL
  Filled 2015-04-27 (×4): qty 2

## 2015-04-27 NOTE — Progress Notes (Signed)
SUBJECTIVE: Pt complains of heartburn continuous since last night. No SOB. Feels like she needs to burp.  BP 103/45 mmHg  Pulse 84  Temp(Src) 98.6 F (37 C) (Oral)  Resp 19  Ht 5\' 1"  (1.549 m)  Wt 58.7 kg (129 lb 6.6 oz)  BMI 24.46 kg/m2  SpO2 100%  LMP  (LMP Unknown)  Intake/Output Summary (Last 24 hours) at 04/27/15 0831 Last data filed at 04/27/15 0600  Gross per 24 hour  Intake   1303 ml  Output     27 ml  Net   1276 ml    PHYSICAL EXAM General: Well developed, frail, chronically ill appearing in no acute distress.  HEENT: normal  Neck: Mild JVD.  Lungs: CTA anteriorly Heart: RRR, no gallop, murmur, or rub Abdomen: Soft, not distended Extremities: No lower extremity edema. RUE edematous.   LABS: Basic Metabolic Panel:  Recent Labs  04/25/15 0451 04/26/15 0348 04/27/15 0300  NA 140 137 141  K 4.0 3.8 3.2*  CL 107 102 106  CO2 20* 20* 20*  GLUCOSE 104* 141* 203*  BUN 11 21* 32*  CREATININE 1.18* 1.81* 2.82*  CALCIUM 9.3 9.4 9.7  MG 2.7*  --   --   PHOS 2.4* 2.6 2.2*   CBC:  Recent Labs  04/26/15 0348 04/27/15 0301  WBC 23.7* 19.2*  HGB 9.1* 9.3*  HCT 29.7* 30.7*  MCV 93.4 95.9  PLT 132* 120*    Current Meds: . allopurinol  200 mg Oral Daily  . antiseptic oral rinse  7 mL Mouth Rinse BID  . aspirin  81 mg Oral Daily  . atorvastatin  40 mg Oral q1800  . calcitRIOL  0.25 mcg Oral QODAY  . calcitRIOL  0.5 mcg Oral QODAY  . darbepoetin (ARANESP) injection - NON-DIALYSIS  200 mcg Subcutaneous Q Tue-1800  . ferric gluconate (FERRLECIT/NULECIT) IV  125 mg Intravenous Daily  . hydrocortisone sod succinate (SOLU-CORTEF) inj  25 mg Intravenous Daily  . latanoprost  1 drop Both Eyes QHS  . metoprolol tartrate  12.5 mg Oral BID  . metronidazole  500 mg Intravenous Q8H  . multivitamin  1 tablet Oral QHS  . mycophenolate  180 mg Oral BID  . pantoprazole  40 mg Oral Daily  . sodium chloride flush  10 mL Intravenous Q12H  . tacrolimus  2 mg Oral  BID  . timolol  1 drop Both Eyes BID  . warfarin  1 mg Oral ONCE-1800  . Warfarin - Pharmacist Dosing Inpatient   Does not apply q1800     ASSESSMENT AND PLAN:  1. NSTEMI: Pt admitted 04/16/2015 with chest pain and dyspnea with volume overload in setting of acute on chronic renal failure and severe anemia and found to have mildly elevated troponin. Peak 0.95. New wall motion abnormality on echo and EF decreased to 25-30%. In the past she has not been considered a good candidate for cardiac cath due to advanced renal disease, frailty, and anemia.  Continue statin and lopressor; add ASA. Will need to clarify goals of care as condition improves prior to performing invasive evaluation. For the above reasons I would favor continued medical therapy with ASA, statin, Imdur, and beta blocker. If she has recurrent angina we can re-evaluate.   2. Cardiomyopathy/Acute on Chronic Diastolic CHF: Echo now with new wall motion abnormality and LVEF=25-30%. EF was normal in February 2017. Volume management per dialysis. May need IHD. Would consider repeating Echo in 4-6 weeks once volume status  corrected and she is on stable medical therapy.  3. 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 Weight up today and I/O positive 1300 cc. Creatinine increasing. Plan IHD today.  4. Anti-phospholipid antibody syndrome/ history of DVT's: She is on chronic coumadin but this has been held recently due to supra- therapeutic INR and anemia. INR is now under 2. Continue heparin until therapeutic on coumadin.   5. History of PAF: Continue beta blocker. Pt in sinus at present. This patients CHA2DS2-VASc Score and unadjusted Ischemic Stroke Rate (% per year) is equal to 7.2 % stroke rate/year from a score of 5 (CHF, HTN, Vascular, Age, Female). Continue heparin. Resuming coumadin.  6. GERD with heartburn. Will start PPI.    Debbie [name] [facility]   4/5/20178:31 AM

## 2015-04-27 NOTE — Progress Notes (Signed)
[place] for heparin Indication: atrial fibrillation  Allergies  Allergen Reactions  . Feraheme [Ferumoxytol] Other (See Comments)    Chest pain, pulsating 02/17/15: tolerated Nulecit  . Vancomycin Anaphylaxis, Itching, Swelling and Other (See Comments)    Tongue swell  . Dorzolamide Hcl-Timolol Mal Rash and Other (See Comments)    Eye pain  . Gentamycin [Gentamicin Sulfate] Itching and Swelling  . Latanoprost Rash and Other (See Comments)    Eye pain  . Cefazolin Itching  . Codeine Itching  . Hydrocodone-Acetaminophen Itching  . Penicillins Itching    *tolerated ampicillin during 02/2015 admission Has patient had a PCN reaction causing immediate rash, facial/tongue/throat swelling, SOB or lightheadedness with hypotension:No Has patient had a PCN reaction causing severe rash involving mucus membranes or skin necrosis:No Has patient had a PCN reaction that required hospitalization:No Has patient had a PCN reaction occurring within the last 10 years:No If all of the above answers are "NO", then may proceed with Cephalosporin use.     Patient Measurements: Height: 5\' 1"  (154.9 cm) Weight: 129 lb 6.6 oz (58.7 kg) IBW/kg (Calculated) : 47.8 Heparin Dosing Weight: 63kg   Vital Signs: Temp: 98.6 F (37 C) (04/05 0351) Temp Source: Oral (04/05 0351) BP: 103/45 mmHg (04/04 2107) Pulse Rate: 86 (04/05 0700)  Labs:  Recent Labs  04/25/15 0451 04/26/15 0348 04/26/15 0349 04/27/15 0300 04/27/15 0301  HGB 9.2* 9.1*  --   --  9.3*  HCT 29.4* 29.7*  --   --  30.7*  PLT 144* 132*  --   --  120*  LABPROT 17.5*  --  19.0* 17.7*  --   INR 1.42  --  1.59* 1.45  --   HEPARINUNFRC 0.56  --  0.66 0.62  --   CREATININE 1.18* 1.81*  --  2.82*  --     Estimated Creatinine Clearance: 16.4 mL/min (by C-G formula based on Cr of 2.82).   Medications:  Infusions:  . sodium chloride 10 mL/hr at 04/25/15 1900  . heparin 600 Units/hr (04/26/15  0800)  . nitroGLYCERIN      Assessment: 38 yof presented to the hospital with CP. She is on chronic coumadin for hx afib, DVT and anti-phospholipid syndrome. Coumadin was reversed and INR is now subtherapeutic so she was initiated on IV heparin. Heparin level is now therapeutic. Warfarin restarted, No bleeding noted. HL 0.62, hgb 9.3, plts 120, INR 1.45  PTA warfarin dose: 1mg  daily except 0.5 mg on MF  Goal of Therapy:  Heparin level 0.3-0.7 units/ml Monitor platelets by anticoagulation protocol: Yes   Plan:  - Continue heparin 600 units/hr - Warfarin 1 mg po x 1 tonight - Daily HL, INR, CBC - Monitor for S&S of bleeding - Flagyl may increase anticoag effect of warfarin  Angela Burke, PharmD Pharmacy Resident Pager: [phone]  04/27/2015 7:36 AM

## 2015-04-27 NOTE — Progress Notes (Signed)
Report given to RN. Patient will go to HD from [facility] and will return to [facility] after dialysis. RN made aware. Patient is A/O, Bp 119/49, HR 84, RR 17, sat 100% on 2L [place]

## 2015-04-27 NOTE — Progress Notes (Addendum)
[facility] PROGRESS NOTE  ADILEE CARDINAL [id number] DOB: 1951-01-03 DOA: 04/12/2015 PCP: No PCP Per Patient Admit HPI / Brief Narrative: Debbie [name] is a 65 y.o. WF PMHx Pulmonary HTN, CAD native artery, Heart Murmur, Paroxysmal A-Fib, esophageal stricture, Cholelithiasis, Colon polyps, ESRD S/P kidney transplant, Antiphospholipid Syndrome, LLE DVT,   Chest pain, midsternal to left chest. Intermittent since being discharged from hospital 03/28/2015. Comes on acutely. Comes on at rest but also with exertion. Patient states that this chest pain feels like her previous heart attack chest pain. Improved with nitroglycerin and aspirin. Associated with intermittent but progressive lower extremity swelling since last admission. Patient also states that she started wheezing since presenting here to the emergency room. Patient reports crampy abdominal pain which came on approximately 1 day ago associated with very dark loose stools which, with greater frequency. Continues her IV antibiotics for recent bacteremia.  Multifactorial Respiratory Failure in setting of suspected NSTEMI complicated by acute on chronic renal failure and combined acute on chronicCHF / Anemia. Need for HD cath placement and CVVHD/ transfer to ICU and CVVHD to address volume overload. Long discussion with patient and family re: Change in code status to LCB to allow for pressors for hypotension with CVVHD. No intubation, No CPR, no defib, but pressors for BP and chemical code only. She looks better w volume removal. When filter clots she can leave the ICU as will plan for transition to IHD  HPI/Subjective: No issues, continue to be very weak, very poor urine output  Assessment/Plan: Acute Respiratory Failure  -required BiPAP, due to Volume overload 2/2 RF and CHF -Negative 16L thus far -also had NSTEMI this admission, will need Cardiac workup once renal issues corrected -was on CVVH, now started intermittent HD per  Renal -remains anuric  Acute on chronic combined systolic/diastolic CHF -Metoprolol [id number] mg BID -Volume management per Nephrology; patient on HD  Severe secondary PAH -See acute on chronic combined systolic and diastolic CHF  NSTEMI ( mildly elevated troponin)/Hypotension --Peak troponin 0.95. New wall motion abnormality on echo and EF decreased to 25-30%.Per Cards not been considered a good candidate for cardiac cath due to advanced renal disease, frailty, and anemia. Continue statin, lopressor, ASA -Cards following  PAF -Currently in NSR -restart coumadin, on IV heparin too  Acute on Chronic stage 4 renal failure/S/P Renal Transplant CVVH. > transition to IHD  -per Renal -remains anuric -continue myfortic and prograf  Anti-Phospholipid antibody syndrome/ History DVT's Heparin/coumadin  C.diff colitis  Cont amp; complete 8d (day 4/8 abx) Flagyl IV; cont X 7d after stops abx   H/o Enterococcus Bacteremia:  -Enterococcus w/ septic arthritis of R shoulder. TEE negative for vegetations. Refused I&D of R shoulder previously. -seen by ID last admission -completed course on 4/4  Hypotensive  - Adrenal Suppression in a pt on chronic steroids -Stop hydrocortisone and resume home dose prednisone -takes prednisone 5 mg daily at home    Hx of Right fibular and medial malleolus fracture:  -Followed by Dr. Wandra Feinstein to prior cardiac issues patient was not considered a candidate for operative correction  Delirium  -due to metabolic derangements, all of the above, improving  Global: Palliative following  Code Status: No CPR, no defib, but pressors for BP and chemical code only Family Communication: None Disposition Plan: Will await palliative care input   Consultants: Dr Peter M Martinique,. Cardiology [place] nephrology Dr.Wesam Kathryne [facility] CM    Procedures: Echo: EF: 55-60% Moderate pulmonary HTN:PA peak pressure: 64 mm  Hg (S). Mild MV  stenosis Pending repeat Echo done 04/19/15  03/22/15 TEE No evidence vegetation Echo 3/29 echo>>>25% to 30%. Doppler parameters are consistent with both elevated ventricular end-diastolic filling pressure and elevated left atrial filling pressure, PA 72 04/19/2015: CXR Significant worsening of severe fluffy parahilar opacities throughout both lungs, favor worsening severe pulmonary edema / ARDS.Stable trace bilateral pleural effusions. 3/29 transfer to icu for CVVH  Cultures NA  Antibiotics: Ampicillin 04/13/2015>>> Flagyl (vanc allergy) 3/31>>>   DVT prophylaxis     Objective: Filed Vitals:   04/27/15 0700 04/27/15 0800 04/27/15 0835 04/27/15 1158  BP:      Pulse: 86 84    Temp:   98 F (36.7 C) 98 F (36.7 C)  TempSrc:   Oral Oral  Resp: 18 19    Height:      Weight:      SpO2: 100% 100%      Intake/Output Summary (Last 24 hours) at 04/27/15 1314 Last data filed at 04/27/15 0800  Gross per 24 hour  Intake    681 ml  Output     24 ml  Net    657 ml   Filed Weights   04/25/15 0500 04/26/15 0600 04/27/15 0240  Weight: 63.4 kg (139 lb 12.4 oz) 56.4 kg (124 lb 5.4 oz) 58.7 kg (129 lb 6.6 oz)     Exam: General: Alert , awake, chronically ill appearing HEENT: PERRLA, EOMI Lungs: Clear to auscultation bilaterally without wheezes or crackles Cardiovascular: Regular rate and rhythm without murmur gallop or rub normal S1 and S2 Abdomen:negative abdominal pain, negative dysphagia, nondistended, positive soft, bowel sounds, no rebound, no ascites, no appreciable mass, PEG tube in place negative sign of infection Extremities: No significant cyanosis, clubbing, or edema bilateral lower extremities    Data Reviewed: Basic Metabolic Panel:  Recent Labs Lab 04/21/15 0350  04/22/15 0357  04/23/15 0550  04/24/15 0519 04/24/15 1629 04/25/15 0451 04/26/15 0348 04/27/15 0300  NA 141  < > 138  < > 139  < > 140 139 140 137 141  K 3.8  < > 3.8  < > 3.7  < > 4.3 4.2 4.0 3.8  3.2*  CL 105  < > 104  < > 105  < > 106 107 107 102 106  CO2 23  < > 22  < > 23  < > 21* 20* 20* 20* 20*  GLUCOSE 121*  < > 119*  < > 114*  < > 142* 118* 104* 141* 203*  BUN 34*  < > 14  < > 12  < > 9 9 11  21* 32*  CREATININE 2.09*  < > 1.05*  < > 1.06*  < > 0.88 1.00 1.18* 1.81* 2.82*  CALCIUM 8.9  < > 8.8*  < > 8.9  < > 9.0 9.3 9.3 9.4 9.7  MG 2.3  --  2.4  --  2.4  --  2.6*  --  2.7*  --   --   PHOS 3.0  < > 2.1*  < > 2.8  < > 2.6 2.5 2.4* 2.6 2.2*  < > = values in this interval not displayed. Liver Function Tests:  Recent Labs Lab 04/24/15 0519 04/24/15 1629 04/25/15 0451 04/26/15 0348 04/27/15 0300  ALBUMIN 2.1* 2.2* 2.1* 2.0* 2.0*   No results for input(s): LIPASE, AMYLASE in the last 168 hours. No results for input(s): AMMONIA in the last 168 hours. CBC:  Recent Labs Lab 04/23/15 0550 04/24/15 0519 04/25/15  0451 04/26/15 0348 04/27/15 0301  WBC 12.6* 16.8* 20.8* 23.7* 19.2*  HGB 9.1* 9.2* 9.2* 9.1* 9.3*  HCT 29.1* 29.7* 29.4* 29.7* 30.7*  MCV 90.1 90.8 92.2 93.4 95.9  PLT 174 165 144* 132* 120*   Cardiac Enzymes: No results for input(s): CKTOTAL, CKMB, CKMBINDEX, TROPONINI in the last 168 hours. BNP (last 3 results)  Recent Labs  04/06/15 1200 03/28/2015 0922 04/19/15 0400  BNP 717.5* 1333.7* 2795.1*    ProBNP (last 3 results) No results for input(s): PROBNP in the last 8760 hours.  CBG:  Recent Labs Lab 04/21/15 0319 04/22/15 1737  GLUCAP 120* 132*    Recent Results (from the past 240 hour(s))  Culture, Urine     Status: None   Collection Time: 04/20/15  1:40 PM  Result Value Ref Range Status   Specimen Description URINE, CATHETERIZED  Final   Special Requests Ampicillin Immunocompromised  Final   Culture NO GROWTH 1 DAY  Final   Report Status 04/21/2015 FINAL  Final  C difficile quick scan w PCR reflex     Status: Abnormal   Collection Time: 04/21/15  3:36 PM  Result Value Ref Range Status   C Diff antigen POSITIVE (A) NEGATIVE Final    C Diff toxin POSITIVE (A) NEGATIVE Final   C Diff interpretation Positive for toxigenic C. difficile  Final    Comment: CRITICAL RESULT CALLED TO, READ BACK BY AND VERIFIED WITH: Debbie Roys RN 16:50 04/21/15 ([email or web address])      Studies: No results found.  Scheduled Meds: . allopurinol  200 mg Oral Daily  . antiseptic oral rinse  7 mL Mouth Rinse BID  . aspirin  81 mg Oral Daily  . atorvastatin  40 mg Oral q1800  . calcitRIOL  0.25 mcg Oral QODAY  . calcitRIOL  0.5 mcg Oral QODAY  . darbepoetin (ARANESP) injection - NON-DIALYSIS  200 mcg Subcutaneous Q Tue-1800  . hydrocortisone sod succinate (SOLU-CORTEF) inj  25 mg Intravenous Daily  . latanoprost  1 drop Both Eyes QHS  . metoprolol tartrate  12.5 mg Oral BID  . metronidazole  500 mg Intravenous Q8H  . multivitamin  1 tablet Oral QHS  . mycophenolate  180 mg Oral BID  . pantoprazole  40 mg Oral Daily  . sodium chloride flush  10 mL Intravenous Q12H  . tacrolimus  2 mg Oral BID  . timolol  1 drop Both Eyes BID  . warfarin  1 mg Oral ONCE-1800  . Warfarin - Pharmacist Dosing Inpatient   Does not apply q1800   Continuous Infusions: . sodium chloride 10 mL/hr at 04/25/15 1900  . heparin 600 Units/hr (04/27/15 0954)  . nitroGLYCERIN      Active Problems:   CKD (chronic kidney disease), stage IV (HCC)   HTN (hypertension)   Renal transplant recipient   Acute renal failure superimposed on stage 4 chronic kidney disease (HCC)   Supratherapeutic INR   Enterococcal bacteremia   Hypotension   PAF (paroxysmal atrial fibrillation) (HCC)   Acute on chronic diastolic CHF (congestive heart failure), NYHA class 1 (HCC)   Wheezing   Acute GI bleeding   Palliative care encounter   Acute respiratory failure with hypoxia (HCC)   Chronic combined systolic and diastolic CHF (congestive heart failure) (HCC)   Antiphospholipid syndrome ([place])   C. difficile colitis    Time spent: 35 minutes    Debbie [name]  [facility] Pager  [phone] If 7PM-7AM, please contact night-coverage at [email or web address], password [facility] 04/27/2015, 1:14  PM  LOS: 9 days

## 2015-04-27 NOTE — Progress Notes (Signed)
Report attempted. RN at lunch now. Will call back

## 2015-04-27 NOTE — Progress Notes (Signed)
RN, Woodfin Ganja, paged because there was an order to d'c fem cath if HD went well. Asked Woodfin Ganja to call nephrology about this. Woodfin Ganja called back stating IV team wanted IV Heparin off for 2 hours before removing catheter. Pt's INR is not therapeutic yet, on Heparin to coumadin bridge. Will continue present plan of care overnight and defer decision to stop Heparin to remove cath to attending in am.  Clance Boll, NP [facility]

## 2015-04-27 NOTE — Progress Notes (Signed)
S: CO "heartburn" O:BP 103/45 mmHg  Pulse 86  Temp(Src) 98.6 F (37 C) (Oral)  Resp 18  Ht 5\' 1"  (1.549 m)  Wt 58.7 kg (129 lb 6.6 oz)  BMI 24.46 kg/m2  SpO2 100%  LMP  (LMP Unknown)  Intake/Output Summary (Last 24 hours) at 04/27/15 0747 Last data filed at 04/27/15 0600  Gross per 24 hour  Intake   1556 ml  Output     27 ml  Net   1529 ml   Weight change: 2.3 kg (5 lb 1.1 oz) [id number] and alert CVS:RRR Resp: basilar crackles Abd:+ BS NTND Ext: no edema RUA AVG + bruit NEURO: CNI, no asterixis, follows commands Lt PICC line Rt Fem HD cath   . allopurinol  200 mg Oral Daily  . antiseptic oral rinse  7 mL Mouth Rinse BID  . aspirin  81 mg Oral Daily  . atorvastatin  40 mg Oral q1800  . calcitRIOL  0.25 mcg Oral QODAY  . calcitRIOL  0.5 mcg Oral QODAY  . darbepoetin (ARANESP) injection - NON-DIALYSIS  200 mcg Subcutaneous Q Tue-1800  . ferric gluconate (FERRLECIT/NULECIT) IV  125 mg Intravenous Daily  . hydrocortisone sod succinate (SOLU-CORTEF) inj  25 mg Intravenous Daily  . latanoprost  1 drop Both Eyes QHS  . metoprolol tartrate  12.5 mg Oral BID  . metronidazole  500 mg Intravenous Q8H  . multivitamin  1 tablet Oral QHS  . mycophenolate  180 mg Oral BID  . sodium chloride flush  10 mL Intravenous Q12H  . tacrolimus  2 mg Oral BID  . timolol  1 drop Both Eyes BID  . warfarin  1 mg Oral ONCE-1800  . Warfarin - Pharmacist Dosing Inpatient   Does not apply q1800   No results found. BMET    Component Value Date/Time   NA 141 04/27/2015 0300   K 3.2* 04/27/2015 0300   CL 106 04/27/2015 0300   CO2 20* 04/27/2015 0300   GLUCOSE 203* 04/27/2015 0300   BUN 32* 04/27/2015 0300   CREATININE 2.82* 04/27/2015 0300   CALCIUM 9.7 04/27/2015 0300   GFRNONAA 17* 04/27/2015 0300   GFRAA 19* 04/27/2015 0300   CBC    Component Value Date/Time   WBC 19.2* 04/27/2015 0301   RBC 3.20* 04/27/2015 0301   RBC 3.17* 02/15/2015 0527   HGB 9.3* 04/27/2015 0301   HCT 30.7*  04/27/2015 0301   PLT 120* 04/27/2015 0301   MCV 95.9 04/27/2015 0301   MCH 29.1 04/27/2015 0301   MCHC 30.3 04/27/2015 0301   RDW 24.0* 04/27/2015 0301   LYMPHSABS 3.0 04/09/2015 0912   MONOABS 1.0 04/11/2015 0912   EOSABS 0.6 04/17/2015 0912   BASOSABS 0.0 04/13/2015 0912     Assessment: 1.  Acute on CKD 4 in renal Tx, UO remains minimal  2. Anemia on aranesp 3.  Sec HPTH on calcitriol 4. Acute Resp failure, improving 5. Mild thrombocytopenia  Plan: 1. Plan HD today 2. Do not replace K, will correct with HD 3. DC fem cath after HD if AVG works well   Debbie [name] T

## 2015-04-28 ENCOUNTER — Other Ambulatory Visit: Payer: Self-pay

## 2015-04-28 LAB — BASIC METABOLIC PANEL
Anion gap: 14 (ref 5–15)
BUN: 15 mg/dL (ref 6–20)
CALCIUM: 8.5 mg/dL — AB (ref 8.9–10.3)
CO2: 22 mmol/L (ref 22–32)
Chloride: 98 mmol/L — ABNORMAL LOW (ref 101–111)
Creatinine, Ser: 1.84 mg/dL — ABNORMAL HIGH (ref 0.44–1.00)
GFR calc Af Amer: 32 mL/min — ABNORMAL LOW (ref >60)
GFR calc non Af Amer: 28 mL/min — ABNORMAL LOW (ref >60)
GLUCOSE: 175 mg/dL — AB (ref 65–99)
Potassium: 3.7 mmol/L (ref 3.5–5.1)
Sodium: 134 mmol/L — ABNORMAL LOW (ref 135–145)

## 2015-04-28 LAB — HEPARIN LEVEL (UNFRACTIONATED): HEPARIN UNFRACTIONATED: 0.35 [IU]/mL (ref 0.30–0.70)

## 2015-04-28 LAB — PROTIME-INR
INR: 1.58 — ABNORMAL HIGH (ref 0.00–1.49)
Prothrombin Time: 18.9 seconds — ABNORMAL HIGH (ref 11.6–15.2)

## 2015-04-28 LAB — CBC
HEMATOCRIT: 28 % — AB (ref 36.0–46.0)
HEMOGLOBIN: 8.5 g/dL — AB (ref 12.0–15.0)
MCH: 29.2 pg (ref 26.0–34.0)
MCHC: 30.4 g/dL (ref 30.0–36.0)
MCV: 96.2 fL (ref 78.0–100.0)
Platelets: 77 10*3/uL — ABNORMAL LOW (ref 150–400)
RBC: 2.91 MIL/uL — ABNORMAL LOW (ref 3.87–5.11)
RDW: 25.2 % — ABNORMAL HIGH (ref 11.5–15.5)
WBC: 12.6 10*3/uL — AB (ref 4.0–10.5)

## 2015-04-28 MED ORDER — MUPIROCIN CALCIUM 2 % EX CREA
TOPICAL_CREAM | Freq: Two times a day (BID) | CUTANEOUS | Status: DC
  Filled 2015-04-28: qty 15

## 2015-04-28 MED ORDER — HYDROCORTISONE NA SUCCINATE PF 100 MG IJ SOLR
25.0000 mg | Freq: Four times a day (QID) | INTRAMUSCULAR | Status: AC
  Administered 2015-04-28 (×2): 25 mg via INTRAVENOUS
  Filled 2015-04-28 (×2): qty 2

## 2015-04-28 MED ORDER — OXYMETAZOLINE HCL 0.05 % NA SOLN: 2.0000 | Freq: Once | NASAL | Status: DC

## 2015-04-28 MED ORDER — WARFARIN SODIUM 1 MG PO TABS
1.0000 mg | ORAL_TABLET | Freq: Once | ORAL | Status: AC
  Administered 2015-04-28: 1 mg via ORAL
  Filled 2015-04-28: qty 1

## 2015-04-28 MED ORDER — WARFARIN SODIUM 1 MG PO TABS
1.0000 mg | ORAL_TABLET | Freq: Once | ORAL | Status: DC
  Filled 2015-04-28: qty 1

## 2015-04-28 MED ORDER — OXYMETAZOLINE HCL 0.05 % NA SOLN
1.0000 | Freq: Two times a day (BID) | NASAL | Status: DC
  Administered 2015-04-28 (×2): 1 via NASAL
  Filled 2015-04-28: qty 15

## 2015-04-28 MED ORDER — MUPIROCIN 2 % EX OINT
TOPICAL_OINTMENT | Freq: Two times a day (BID) | CUTANEOUS | Status: DC
  Administered 2015-04-28 (×2): via TOPICAL
  Filled 2015-04-28: qty 22

## 2015-04-28 NOTE — Care Management Note (Addendum)
Case Management Note  Patient Details  Name: Debbie [name] MRN: [id number] Date of Birth: [birth year]  Subjective/Objective:   Pt was admitted from SNF but family plans to take her home when discharged.  Per dtr, pt has rollator, wheelchair, and BSC, will need hospital bed.  Pt will need home health services and dtr states [facility] was providing services before pt was admitted to SNF and she requests that agency for home health RN, PT, and aide services.    Pt does not have PCP and is interested in arranging follow-up @ [facility] and [facility]  [facility] CM, Debbie [name], will talk with pt and dtr.                         Expected Discharge Plan:  [place]  Discharge planning Services  CM Consult  Post Acute Care Choice:  Durable Medical Equipment, Home Health Choice offered to:  Adult Children  DME Arranged:  Hospital bed DME Agency:  [facility] Arranged:  RN, PT, [facility] Agency:  [facility]  Status of Service:  In process, will continue to follow  Debbie Cooter, RN 04/28/2015, 3:35 PM

## 2015-04-28 NOTE — Progress Notes (Signed)
S: CO "heartburn" O:BP 96/48 mmHg  Pulse 96  Temp(Src) 98.3 F (36.8 C) (Oral)  Resp 22  Ht 5\' 1"  (1.549 m)  Wt 61.7 kg (136 lb 0.4 oz)  BMI 25.71 kg/m2  SpO2 99%  LMP  (LMP Unknown)  Intake/Output Summary (Last 24 hours) at 04/28/15 0807 Last data filed at 04/27/15 1800  Gross per 24 hour  Intake      0 ml  Output     -3 ml  Net      3 ml   Weight change: 3 kg (6 lb 9.8 oz) [id number] and alert CVS:RRR Resp: basilar crackles Abd:+ BS NTND Ext: no edema RUA AVG + bruit NEURO: CNI, no asterixis, follows commands Lt PICC line Rt Fem HD cath   . allopurinol  200 mg Oral Daily  . antiseptic oral rinse  7 mL Mouth Rinse BID  . aspirin  81 mg Oral Daily  . atorvastatin  40 mg Oral q1800  . calcitRIOL  0.25 mcg Oral QODAY  . calcitRIOL  0.5 mcg Oral QODAY  . darbepoetin (ARANESP) injection - NON-DIALYSIS  200 mcg Subcutaneous Q Tue-1800  . latanoprost  1 drop Both Eyes QHS  . metoprolol tartrate  12.5 mg Oral BID  . metronidazole  500 mg Intravenous Q8H  . midodrine  10 mg Oral TID WC  . multivitamin  1 tablet Oral QHS  . mupirocin ointment   Topical BID  . mycophenolate  180 mg Oral BID  . pantoprazole  40 mg Oral Daily  . predniSONE  5 mg Oral Q breakfast  . sodium chloride flush  10 mL Intravenous Q12H  . tacrolimus  2 mg Oral BID  . timolol  1 drop Both Eyes BID  . Warfarin - Pharmacist Dosing Inpatient   Does not apply q1800   No results found. BMET    Component Value Date/Time   NA 134* 04/28/2015 0056   K 3.7 04/28/2015 0056   CL 98* 04/28/2015 0056   CO2 22 04/28/2015 0056   GLUCOSE 175* 04/28/2015 0056   BUN 15 04/28/2015 0056   CREATININE 1.84* 04/28/2015 0056   CALCIUM 8.5* 04/28/2015 0056   GFRNONAA 28* 04/28/2015 0056   GFRAA 32* 04/28/2015 0056   CBC    Component Value Date/Time   WBC 12.6* 04/28/2015 0535   RBC 2.91* 04/28/2015 0535   RBC 3.17* 02/15/2015 0527   HGB 8.5* 04/28/2015 0535   HCT 28.0* 04/28/2015 0535   PLT 77* 04/28/2015  0535   MCV 96.2 04/28/2015 0535   MCH 29.2 04/28/2015 0535   MCHC 30.4 04/28/2015 0535   RDW 25.2* 04/28/2015 0535   LYMPHSABS 3.0 03/24/2015 0912   MONOABS 1.0 04/14/2015 0912   EOSABS 0.6 04/09/2015 0912   BASOSABS 0.0 03/27/2015 0912     Assessment: 1.  Acute on CKD 4 in renal Tx, UO remains minimal  2. Anemia on aranesp 3.  Sec HPTH on calcitriol 4. Acute Resp failure, resolved 5.  Thrombocytopenia, worsening  Plan: 1. Plan HD tomorrow 2 Still needs fem cath removed 3. Check prograf level 4. Follow PLT ct.  Will ask pathology to look for schistocytes   Gerold Sar T

## 2015-04-28 NOTE — Progress Notes (Signed)
04/28/2015 Patient nose bleeding at 33 Dr Broadus John was made aware. Patient on heparin gtt. Per Dr Broadus John stop the heparin gtt. [facility] RN.

## 2015-04-28 NOTE — Progress Notes (Signed)
[place] for heparin Indication: atrial fibrillation  Allergies  Allergen Reactions  . Feraheme [Ferumoxytol] Other (See Comments)    Chest pain, pulsating 02/17/15: tolerated Nulecit  . Vancomycin Anaphylaxis, Itching, Swelling and Other (See Comments)    Tongue swell  . Dorzolamide Hcl-Timolol Mal Rash and Other (See Comments)    Eye pain  . Gentamycin [Gentamicin Sulfate] Itching and Swelling  . Latanoprost Rash and Other (See Comments)    Eye pain  . Cefazolin Itching  . Codeine Itching  . Hydrocodone-Acetaminophen Itching  . Penicillins Itching    *tolerated ampicillin during 02/2015 admission Has patient had a PCN reaction causing immediate rash, facial/tongue/throat swelling, SOB or lightheadedness with hypotension:No Has patient had a PCN reaction causing severe rash involving mucus membranes or skin necrosis:No Has patient had a PCN reaction that required hospitalization:No Has patient had a PCN reaction occurring within the last 10 years:No If all of the above answers are "NO", then may proceed with Cephalosporin use.     Patient Measurements: Height: 5\' 1"  (154.9 cm) Weight: 136 lb 0.4 oz (61.7 kg) IBW/kg (Calculated) : 47.8 Heparin Dosing Weight: 63kg   Vital Signs: Temp: 98.3 F (36.8 C) (04/06 0834) Temp Source: Oral (04/06 0834) BP: 100/53 mmHg (04/06 0834) Pulse Rate: 89 (04/06 0834)  Labs:  Recent Labs  04/26/15 0348 04/26/15 0349 04/27/15 0300 04/27/15 0301 04/28/15 0056 04/28/15 0535 04/28/15 0546  HGB 9.1*  --   --  9.3*  --  8.5*  --   HCT 29.7*  --   --  30.7*  --  28.0*  --   PLT 132*  --   --  120*  --  77*  --   LABPROT  --  19.0* 17.7*  --   --  18.9*  --   INR  --  1.59* 1.45  --   --  1.58*  --   HEPARINUNFRC  --  0.66 0.62  --   --   --  0.35  CREATININE 1.81*  --  2.82*  --  1.84*  --   --     Estimated Creatinine Clearance: 25.7 mL/min (by C-G formula based on Cr of  1.84).   Medications:  Infusions:  . sodium chloride 10 mL/hr at 04/27/15 2022    Assessment: 39 yof presented to the hospital with CP. On chronic coumadin for hx afib, DVT, and anti-phospholipid syndrome. Coumadin was reversed on 3/29 and INR remains subtherapeutic. On heparin bridge but stopped 4/6 due to low platelets. Also on Flagyl, potentially can affect the INR.  Hgb 8.5 stable, plt drop 120>77, INR 1.45>1.58.  PTA warfarin dose: 1mg  daily except 0.5 mg on MF  Goal of Therapy:  Heparin level 0.3-0.7 units/ml  INR 2-3 Monitor platelets by anticoagulation protocol: Yes   Plan:  - Heparin d/c'd per MD due to low plt - Warfarin 1 mg po x 1 tonight - Daily HL, INR, CBC - Monitor for S&S of bleeding - Flagyl may increase anticoag effect of warfarin  Elicia Lamp, PharmD, [facility] Clinical Pharmacist Pager [phone] 04/28/2015 11:02 AM

## 2015-04-28 NOTE — Progress Notes (Signed)
04/28/2015 Patient  blood pressure was 85/42 and 76/42 at 1238. Dr Broadus John was made a aware. Patient was given he schedule Midodrine 10 mg at 1235. Rico Sheehan RN

## 2015-04-28 NOTE — Progress Notes (Signed)
SUBJECTIVE: Heartburn is much better. Still notes chest pain when drinking thru a straw. No SOB  BP 96/48 mmHg  Pulse 96  Temp(Src) 98.3 F (36.8 C) (Oral)  Resp 22  Ht 5\' 1"  (1.549 m)  Wt 61.7 kg (136 lb 0.4 oz)  BMI 25.71 kg/m2  SpO2 99%  LMP  (LMP Unknown)  Intake/Output Summary (Last 24 hours) at 04/28/15 0810 Last data filed at 04/27/15 1800  Gross per 24 hour  Intake      0 ml  Output     -3 ml  Net      3 ml    PHYSICAL EXAM General: Well developed, frail, chronically ill appearing in no acute distress.  HEENT: normal  Neck: Mild JVD.  Lungs: CTA anteriorly Heart: RRR, no gallop, murmur, or rub Abdomen: Soft, not distended Extremities: No lower extremity edema. RUE edematous but improved.   LABS: Basic Metabolic Panel:  Recent Labs  04/26/15 0348 04/27/15 0300 04/28/15 0056  NA 137 141 134*  K 3.8 3.2* 3.7  CL 102 106 98*  CO2 20* 20* 22  GLUCOSE 141* 203* 175*  BUN 21* 32* 15  CREATININE 1.81* 2.82* 1.84*  CALCIUM 9.4 9.7 8.5*  PHOS 2.6 2.2*  --    CBC:  Recent Labs  04/27/15 0301 04/28/15 0535  WBC 19.2* 12.6*  HGB 9.3* 8.5*  HCT 30.7* 28.0*  MCV 95.9 96.2  PLT 120* 77*    Current Meds: . allopurinol  200 mg Oral Daily  . antiseptic oral rinse  7 mL Mouth Rinse BID  . aspirin  81 mg Oral Daily  . atorvastatin  40 mg Oral q1800  . calcitRIOL  0.25 mcg Oral QODAY  . calcitRIOL  0.5 mcg Oral QODAY  . darbepoetin (ARANESP) injection - NON-DIALYSIS  200 mcg Subcutaneous Q Tue-1800  . latanoprost  1 drop Both Eyes QHS  . metoprolol tartrate  12.5 mg Oral BID  . metronidazole  500 mg Intravenous Q8H  . midodrine  10 mg Oral TID WC  . multivitamin  1 tablet Oral QHS  . mupirocin ointment   Topical BID  . mycophenolate  180 mg Oral BID  . pantoprazole  40 mg Oral Daily  . predniSONE  5 mg Oral Q breakfast  . sodium chloride flush  10 mL Intravenous Q12H  . tacrolimus  2 mg Oral BID  . timolol  1 drop Both Eyes BID  . Warfarin -  Pharmacist Dosing Inpatient   Does not apply q1800     ASSESSMENT AND PLAN:  1. NSTEMI: Pt admitted 03/29/2015 with chest pain and dyspnea with volume overload in setting of acute on chronic renal failure and severe anemia and found to have mildly elevated troponin. Peak 0.95. New wall motion abnormality on echo and EF decreased to 25-30%. In the past she has not been considered a good candidate for cardiac cath due to advanced renal disease, frailty, and anemia.  Continue statin and lopressor; add ASA. Will need to clarify goals of care as condition improves prior to performing invasive evaluation. For the above reasons I would favor continued medical therapy with ASA, statin, Imdur, and beta blocker.  2. Cardiomyopathy/Acute on Chronic Diastolic CHF: Echo now with new wall motion abnormality and LVEF=25-30%. EF was normal in February 2017. Volume management per dialysis. On  IHD. Would consider repeating Echo in 4-6 weeks once volume status corrected and she is on stable medical therapy.  3. Acute on Chronic Stage  4 CKD with Renal Transplant: Nephrology following. HD yesterday  4. Anti-phospholipid antibody syndrome/ history of DVT's: She is on chronic coumadin but this has been held recently due to supra- therapeutic INR and anemia. INR is 1.58. Continue heparin until therapeutic on coumadin.   5. History of PAF: Continue beta blocker. Pt in sinus at present. This patients CHA2DS2-VASc Score and unadjusted Ischemic Stroke Rate (% per year) is equal to 7.2 % stroke rate/year from a score of 5 (CHF, HTN, Vascular, Age, Female). Continue heparin. Resuming coumadin.  6. GERD with heartburn. On PPI.   7. Chronic anemia   Collier Salina [facility]   4/6/20178:10 AM

## 2015-04-28 NOTE — Progress Notes (Signed)
[facility] PROGRESS NOTE  Debbie [name] [id number] DOB: April 18, 1950 DOA: 04/20/2015 PCP: No PCP Per Patient Admit HPI / Brief Narrative: Debbie [name] is a 65 y.o. WF PMHx Pulmonary HTN, CAD native artery, Heart Murmur, Paroxysmal A-Fib, esophageal stricture, Cholelithiasis, Colon polyps, ESRD S/P kidney transplant, Antiphospholipid Syndrome, LLE DVT,   Chest pain, midsternal to left chest. Intermittent since being discharged from hospital 03/28/2015. Comes on acutely. Comes on at rest but also with exertion. Patient states that this chest pain feels like her previous heart attack chest pain. Improved with nitroglycerin and aspirin. Associated with intermittent but progressive lower extremity swelling since last admission. Patient also states that she started wheezing since presenting here to the emergency room. Patient reports crampy abdominal pain which came on approximately 1 day ago associated with very dark loose stools which, with greater frequency. Continues her IV antibiotics for recent bacteremia.  Multifactorial Respiratory Failure in setting of suspected NSTEMI complicated by acute on chronic renal failure and combined acute on chronicCHF / Anemia. Need for HD cath placement and CVVHD/ transfer to ICU and CVVHD to address volume overload. Long discussion with patient and family re: Change in code status to LCB to allow for pressors for hypotension with CVVHD. No intubation, No CPR, no defib, but pressors for BP and chemical code only. She looks better w volume removal. When filter clots she can leave the ICU as will plan for transition to IHD  HPI/Subjective: No issues, continue to be very weak, very poor urine output  Assessment/Plan: Acute Respiratory Failure  -required BiPAP, due to Volume overload 2/2 RF and CHF -Negative 16L thus far -also had NSTEMI this admission, will need Cardiac workup once renal issues corrected -was on CVVH, now started intermittent HD per  Renal -remains anuric -used AVF yesterday, per Dr.Mattingly remove HD catheter  Acute on Chronic stage 4 renal failure/S/P Renal Transplant CVVH. > transition to IHD  -per Renal -remains anuric -continue myfortic and prograf -per Renal  Acute on chronic combined systolic/diastolic CHF -Metoprolol [id number] mg BID -Volume management per Nephrology; patient on HD  Severe secondary PAH -See acute on chronic combined systolic and diastolic CHF  NSTEMI ( mildly elevated troponin)/Hypotension --Peak troponin 0.95. New wall motion abnormality on echo and EF decreased to 25-30%.Per Cards not been considered a good candidate for cardiac cath due to advanced renal disease, frailty, and anemia. Continue statin, lopressor, ASA -Cards following  PAF -Currently in NSR -continue coumadin, stop IV heparin due to drop in platelets  Anti-Phospholipid antibody syndrome/ History DVT's Continue coumadin, stop heparin, plts dropped  C.diff colitis  Flagyl IV; cont X 7d after stops ampicillin (4/11)  H/o Enterococcus Bacteremia:  -Enterococcus w/ septic arthritis of R shoulder. TEE negative for vegetations. Refused I&D of R shoulder previously. -seen by ID last admission -completed course on 4/4  Hypotensive  - Adrenal Suppression in a pt on chronic steroids -Stopped hydrocortisone and resumed home dose prednisone -takes prednisone 5 mg daily at home    Hx of Right fibular and medial malleolus fracture:  -Followed by Dr. Wandra Feinstein to prior cardiac issues patient was not considered a candidate for operative correction  Delirium  -due to metabolic derangements, all of the above, improving  Thrombocytopenia -acute on chronic, likely due to heparin, will stop Heparin now, monitor platelets  Global: Palliative following  Code Status: No CPR, no defib, but pressors for BP and chemical code only Family Communication: None Disposition Plan: Palliative following, keep in  SDU  Consultants: Dr Peter M Martinique,. [facility] [place] [facility]rology Dr.Wesam Kathryne [facility] CM    Procedures: Echo: EF: 55-60% Moderate pulmonary HTN:PA peak pressure: 64 mm Hg (S). Mild MV stenosis Pending repeat Echo done 04/19/15  03/22/15 TEE No evidence vegetation Echo 3/29 echo>>>25% to 30%. Doppler parameters are consistent with both elevated ventricular end-diastolic filling pressure and elevated left atrial filling pressure, PA 72 04/02/2015: CXR Significant worsening of severe fluffy parahilar opacities throughout both lungs, favor worsening severe pulmonary edema / ARDS.Stable trace bilateral pleural effusions. 3/29 transfer to icu for CVVH  Cultures NA  Antibiotics: Ampicillin 03/29/2015>>> Flagyl (vanc allergy) 3/31>>>   DVT prophylaxis     Objective: Filed Vitals:   04/28/15 0300 04/28/15 0400 04/28/15 0500 04/28/15 0834  BP: 88/38 90/47 96/48  100/53  Pulse: 100 100 96 89  Temp:    98.3 F (36.8 C)  TempSrc:    Oral  Resp: 23 18 22 19   Height:      Weight:      SpO2: 97% 98% 99% 100%    Intake/Output Summary (Last 24 hours) at 04/28/15 1054 Last data filed at 04/27/15 1800  Gross per 24 hour  Intake      0 ml  Output     -3 ml  Net      3 ml   Filed Weights   04/27/15 0240 04/27/15 1451 04/27/15 1801  Weight: 58.7 kg (129 lb 6.6 oz) 61.7 kg (136 lb 0.4 oz) 61.7 kg (136 lb 0.4 oz)     Exam: General: Alert , awake, chronically ill appearing HEENT: PERRLA, EOMI Lungs: Clear to auscultation bilaterally without wheezes or crackles Cardiovascular: Regular rate and rhythm without murmur gallop or rub normal S1 and S2 Abdomen:negative abdominal pain, negative dysphagia, nondistended, positive soft, bowel sounds, no rebound, no ascites, no appreciable mass, PEG tube in place negative sign of infection Extremities: No significant cyanosis, clubbing, or edema bilateral lower extremities    Data Reviewed: Basic Metabolic Panel:  Recent  Labs Lab 04/22/15 0357  04/23/15 0550  04/24/15 0519 04/24/15 1629 04/25/15 0451 04/26/15 0348 04/27/15 0300 04/28/15 0056  NA 138  < > 139  < > 140 139 140 137 141 134*  K 3.8  < > 3.7  < > 4.3 4.2 4.0 3.8 3.2* 3.7  CL 104  < > 105  < > 106 107 107 102 106 98*  CO2 22  < > 23  < > 21* 20* 20* 20* 20* 22  GLUCOSE 119*  < > 114*  < > 142* 118* 104* 141* 203* 175*  BUN 14  < > 12  < > 9 9 11  21* 32* 15  CREATININE 1.05*  < > 1.06*  < > 0.88 1.00 1.18* 1.81* 2.82* 1.84*  CALCIUM 8.8*  < > 8.9  < > 9.0 9.3 9.3 9.4 9.7 8.5*  MG 2.4  --  2.4  --  2.6*  --  2.7*  --   --   --   PHOS 2.1*  < > 2.8  < > 2.6 2.5 2.4* 2.6 2.2*  --   < > = values in this interval not displayed. Liver Function Tests:  Recent Labs Lab 04/24/15 0519 04/24/15 1629 04/25/15 0451 04/26/15 0348 04/27/15 0300  ALBUMIN 2.1* 2.2* 2.1* 2.0* 2.0*   No results for input(s): LIPASE, AMYLASE in the last 168 hours. No results for input(s): AMMONIA in the last 168 hours. CBC:  Recent Labs Lab 04/24/15 0519 04/25/15 0451  04/26/15 0348 04/27/15 0301 04/28/15 0535  WBC 16.8* 20.8* 23.7* 19.2* 12.6*  HGB 9.2* 9.2* 9.1* 9.3* 8.5*  HCT 29.7* 29.4* 29.7* 30.7* 28.0*  MCV 90.8 92.2 93.4 95.9 96.2  PLT 165 144* 132* 120* 77*   Cardiac Enzymes: No results for input(s): CKTOTAL, CKMB, CKMBINDEX, TROPONINI in the last 168 hours. BNP (last 3 results)  Recent Labs  04/06/15 1200 04/19/2015 0922 04/19/15 0400  BNP 717.5* 1333.7* 2795.1*    ProBNP (last 3 results) No results for input(s): PROBNP in the last 8760 hours.  CBG:  Recent Labs Lab 04/22/15 1737  GLUCAP 132*    Recent Results (from the past 240 hour(s))  Culture, Urine     Status: None   Collection Time: 04/20/15  1:40 PM  Result Value Ref Range Status   Specimen Description URINE, CATHETERIZED  Final   Special Requests Ampicillin Immunocompromised  Final   Culture NO GROWTH 1 DAY  Final   Report Status 04/21/2015 FINAL  Final  C difficile  quick scan w PCR reflex     Status: Abnormal   Collection Time: 04/21/15  3:36 PM  Result Value Ref Range Status   C Diff antigen POSITIVE (A) NEGATIVE Final   C Diff toxin POSITIVE (A) NEGATIVE Final   C Diff interpretation Positive for toxigenic C. difficile  Final    Comment: CRITICAL RESULT CALLED TO, READ BACK BY AND VERIFIED WITH: Edgardo Roys RN 16:50 04/21/15 ([email or web address])      Studies: No results found.  Scheduled Meds: . allopurinol  200 mg Oral Daily  . antiseptic oral rinse  7 mL Mouth Rinse BID  . aspirin  81 mg Oral Daily  . atorvastatin  40 mg Oral q1800  . calcitRIOL  0.25 mcg Oral QODAY  . calcitRIOL  0.5 mcg Oral QODAY  . darbepoetin (ARANESP) injection - NON-DIALYSIS  200 mcg Subcutaneous Q Tue-1800  . latanoprost  1 drop Both Eyes QHS  . metoprolol tartrate  12.5 mg Oral BID  . metronidazole  500 mg Intravenous Q8H  . midodrine  10 mg Oral TID WC  . multivitamin  1 tablet Oral QHS  . mupirocin ointment   Topical BID  . mycophenolate  180 mg Oral BID  . pantoprazole  40 mg Oral Daily  . predniSONE  5 mg Oral Q breakfast  . sodium chloride flush  10 mL Intravenous Q12H  . tacrolimus  2 mg Oral BID  . timolol  1 drop Both Eyes BID  . Warfarin - Pharmacist Dosing Inpatient   Does not apply q1800   Continuous Infusions: . sodium chloride 10 mL/hr at 04/27/15 2022    Active Problems:   CKD (chronic kidney disease), stage IV (HCC)   HTN (hypertension)   Renal transplant recipient   Acute renal failure superimposed on stage 4 chronic kidney disease (HCC)   Supratherapeutic INR   Enterococcal bacteremia   Hypotension   PAF (paroxysmal atrial fibrillation) (HCC)   Acute on chronic diastolic CHF (congestive heart failure), NYHA class 1 (HCC)   Wheezing   Acute GI bleeding   Palliative care encounter   Acute respiratory failure with hypoxia (HCC)   Chronic combined systolic and diastolic CHF (congestive heart failure) (HCC)   Antiphospholipid syndrome ([place])    C. difficile colitis    Time spent: 35 minutes    Karelly Dewalt  [facility] Pager [phone] If 7PM-7AM, please contact night-coverage at [email or web address], password [facility] 04/28/2015, 10:54 AM  LOS: 10 days

## 2015-04-29 ENCOUNTER — Inpatient Hospital Stay (HOSPITAL_COMMUNITY): Payer: Medicare Other

## 2015-04-29 DIAGNOSIS — I10 Essential (primary) hypertension: Secondary | ICD-10-CM

## 2015-04-29 DIAGNOSIS — I200 Unstable angina: Secondary | ICD-10-CM

## 2015-04-29 LAB — RENAL FUNCTION PANEL
ALBUMIN: 1.8 g/dL — AB (ref 3.5–5.0)
ANION GAP: 13 (ref 5–15)
BUN: 32 mg/dL — AB (ref 6–20)
CHLORIDE: 97 mmol/L — AB (ref 101–111)
CO2: 23 mmol/L (ref 22–32)
Calcium: 9 mg/dL (ref 8.9–10.3)
Creatinine, Ser: 2.9 mg/dL — ABNORMAL HIGH (ref 0.44–1.00)
GFR calc Af Amer: 18 mL/min — ABNORMAL LOW (ref >60)
GFR calc non Af Amer: 16 mL/min — ABNORMAL LOW (ref >60)
GLUCOSE: 146 mg/dL — AB (ref 65–99)
PHOSPHORUS: 2.6 mg/dL (ref 2.5–4.6)
POTASSIUM: 4.1 mmol/L (ref 3.5–5.1)
Sodium: 133 mmol/L — ABNORMAL LOW (ref 135–145)

## 2015-04-29 LAB — CBC
HEMATOCRIT: 26.7 % — AB (ref 36.0–46.0)
HEMOGLOBIN: 8.1 g/dL — AB (ref 12.0–15.0)
MCH: 29.2 pg (ref 26.0–34.0)
MCHC: 30.3 g/dL (ref 30.0–36.0)
MCV: 96.4 fL (ref 78.0–100.0)
Platelets: 77 10*3/uL — ABNORMAL LOW (ref 150–400)
RBC: 2.77 MIL/uL — ABNORMAL LOW (ref 3.87–5.11)
RDW: 25.8 % — AB (ref 11.5–15.5)
WBC: 13.2 10*3/uL — ABNORMAL HIGH (ref 4.0–10.5)

## 2015-04-29 LAB — PATHOLOGIST SMEAR REVIEW

## 2015-04-29 LAB — PROTIME-INR
INR: 1.4 (ref 0.00–1.49)
Prothrombin Time: 17.2 seconds — ABNORMAL HIGH (ref 11.6–15.2)

## 2015-04-29 MED ORDER — ALBUMIN HUMAN 25 % IV SOLN
25.0000 g | Freq: Once | INTRAVENOUS | Status: AC
  Filled 2015-04-29: qty 100

## 2015-04-29 MED ORDER — MIDODRINE HCL 5 MG PO TABS
ORAL_TABLET | ORAL | Status: AC
  Filled 2015-04-29: qty 2

## 2015-04-29 MED ORDER — MORPHINE SULFATE 25 MG/ML IV SOLN
1.0000 mg/h | INTRAVENOUS | Status: DC
  Administered 2015-04-29: 2 mg/h via INTRAVENOUS
  Administered 2015-04-29: 1 mg/h via INTRAVENOUS
  Filled 2015-04-29: qty 10

## 2015-04-29 MED ORDER — SODIUM CHLORIDE 0.9 % IV SOLN
INTRAVENOUS | Status: DC
  Administered 2015-04-29: 04:00:00 via INTRAVENOUS

## 2015-04-29 MED ORDER — MORPHINE SULFATE (PF) 2 MG/ML IV SOLN
2.0000 mg | Freq: Once | INTRAVENOUS | Status: AC
  Administered 2015-04-29: 2 mg via INTRAVENOUS
  Filled 2015-04-29: qty 1

## 2015-04-29 MED ORDER — ALBUMIN HUMAN 25 % IV SOLN
INTRAVENOUS | Status: AC
  Administered 2015-04-29: 0.25 g
  Filled 2015-04-29: qty 100

## 2015-04-29 MED ORDER — WARFARIN SODIUM 2 MG PO TABS
2.0000 mg | ORAL_TABLET | Freq: Once | ORAL | Status: DC
  Filled 2015-04-29: qty 1

## 2015-04-30 NOTE — Progress Notes (Signed)
While preparing the Pt for transport to the morgue this RN took down and disposed of 278ml IV morphine down the sink.  This action was witnessed by [facility].

## 2015-05-01 LAB — TACROLIMUS LEVEL: Tacrolimus (FK506) - LabCorp: 6.9 ng/mL (ref 2.0–20.0)

## 2015-05-02 ENCOUNTER — Other Ambulatory Visit: Payer: Self-pay | Admitting: Licensed Clinical Social Worker

## 2015-05-02 NOTE — Patient Outreach (Signed)
Debbie [name]) Care Management  05/02/2015  [name] Dec 12, 1950 [id number]   Notification of patient's death by [name] Liaison. Case Closure.

## 2015-05-05 ENCOUNTER — Ambulatory Visit: Payer: Medicare Other | Admitting: Physician Assistant

## 2015-05-23 NOTE — Progress Notes (Signed)
Pt began complaining of substernal chest pain that radiated to the lower part of left breast and left sided ribs. Pt described pain as 4/10 and felt like a "catching" type pain that would not let go. Pt stated that this pain was similar to the pain she has had with previous heart attacks. Had pt to cough to see if that would relieve pain but it did not. EKG was obtained, pt was already on 2LPM via [place] and Dr Claiborne Billings with cardiology was paged for further advise. Pts BP has been running soft and pt was borderline of 90 sys. After obtaining another BP was able to give 1 nitro and pain subsided. MD Claiborne Billings returned page and stated at this time he did not want any new orders to be put in on pt and that if she continued with pain that I should give 2mg  of morphine if BP could tolerate. Pt will continued to be monitored closely.

## 2015-05-23 NOTE — Progress Notes (Signed)
RN alerted to pts room due to bipap alarm. Pt was breathing shallow, and slow. HR was in the 40's. Pts HR continued to drop. Provider notified. Family and family Bishop were at the bedside. Pts went asystole and breathing stopped within minutes. RN and Brien Mates, RN listened for heart and lung sounds and palpated pulses. All were absent. Time of death declared at 25-Apr-2055.

## 2015-05-23 NOTE — Progress Notes (Addendum)
[facility] PROGRESS NOTE  Debbie [name] [id number] DOB: 11/29/1950 DOA: 03/24/2015 PCP: No PCP Per Patient Admit HPI / Brief Narrative: Debbie [name] is a 65 y.o. WF PMHx Pulmonary HTN, CAD native artery, Heart Murmur, Paroxysmal A-Fib, esophageal stricture, Cholelithiasis, Colon polyps, ESRD S/P kidney transplant, Antiphospholipid Syndrome, LLE DVT,   Chest pain, midsternal to left chest. Intermittent since being discharged from hospital 03/28/2015. Comes on acutely. Comes on at rest but also with exertion. Patient states that this chest pain feels like her previous heart attack chest pain. Improved with nitroglycerin and aspirin. Associated with intermittent but progressive lower extremity swelling since last admission. Patient also states that she started wheezing since presenting here to the emergency room. Patient reports crampy abdominal pain which came on approximately 1 day ago associated with very dark loose stools which, with greater frequency. Continues her IV antibiotics for recent bacteremia.  Multifactorial Respiratory Failure in setting of suspected NSTEMI complicated by acute on chronic renal failure and combined acute on chronicCHF / Anemia. Need for HD cath placement and CVVHD/ transfer to ICU and CVVHD to address volume overload.   HPI/Subjective: Back from HD, couldn't have fluid removed having more chest pain and dyspnea  Assessment/Plan: Acute Respiratory Failure  -required BiPAP, due to Volume overload 2/2 RF and CHF -Negative 16L thus far -also had NSTEMI this admission, unable to have Cardiac workup at this time -was on CVVH, now started intermittent HD per Renal -remains anuric -was on HD today but unable to have fluid removed due to soft/low BP -having more distress and chest pain today, seen by Dr.Jordan and felt that she likely has severe CAD, discussed poor prognosis with daughter and Palliative re consulted. -She looks like she is actively dying/in  severe distress at this time, RR in 35-40, HR in 130s -140, BP 60-70s in Limbs, worsening mentation and updated daughter at bedside, recommended Morphine and Comfort care, daughter agreeable to comfort care, give IV morphine start Morphine gtt, expect hospital demise likely today  Acute on Chronic stage 4 renal failure/S/P Renal Transplant -CVVH > transition to IHD  -per Renal -remains anuric -continue myfortic and prograf -per Renal, see above  Acute on chronic combined systolic/diastolic CHF -Metoprolol AB-[id number] mg BID -Volume management per Nephrology; patient on HD  Severe secondary PAH -See acute on chronic combined systolic and diastolic CHF  NSTEMI ( mildly elevated troponin)/Hypotension --Peak troponin 0.95. New wall motion abnormality on echo and EF decreased to 25-30%.Per Cards not been considered a good candidate for cardiac cath due to advanced renal disease, frailty, and anemia. Continue statin, lopressor, ASA -Cards following  PAF -Currently in NSR -continue coumadin, stopped IV heparin due to drop in platelets  Anti-Phospholipid antibody syndrome/ History DVT's Continue coumadin, stop heparin, plts dropped  C.diff colitis  Flagyl IV; cont X 7d after stops ampicillin (4/11)  H/o Enterococcus Bacteremia:  -Enterococcus w/ septic arthritis of R shoulder. TEE negative for vegetations. Refused I&D of R shoulder previously. -seen by ID last admission -completed course on 4/4  Hypotensive  - Adrenal Suppression in a pt on chronic steroids -Stopped hydrocortisone and resumed home dose prednisone -takes prednisone 5 mg daily at home, back on home dose    Hx of Right fibular and medial malleolus fracture:  -Followed by Dr. Wandra Feinstein to prior cardiac issues patient was not considered a candidate for operative correction  Delirium  -due to metabolic derangements, all of the above, improving  Thrombocytopenia -acute on chronic, likely due to heparin, stopped  Heparin, monitor platelets  Global: Palliative following  Code Status: DNR Family Communication: daughter at bedside today Disposition Plan: Expect hospital demise  Consultants: Dr Peter M Martinique,. Cardiology [place] nephrology Dr.Wesam Kathryne Sharper PC CM    Procedures: Echo: EF: 55-60% Moderate pulmonary HTN:PA peak pressure: 64 mm Hg (S). Mild MV stenosis Pending repeat Echo done 04/19/15  03/22/15 TEE No evidence vegetation Echo 3/29 echo>>>25% to 30%. Doppler parameters are consistent with both elevated ventricular end-diastolic filling pressure and elevated left atrial filling pressure, PA 72 04/04/2015: CXR Significant worsening of severe fluffy parahilar opacities throughout both lungs, favor worsening severe pulmonary edema / ARDS.Stable trace bilateral pleural effusions. 3/29 transfer to icu for CVVH  Cultures NA  Antibiotics: Ampicillin 04/20/2015>>> Flagyl (vanc allergy) 3/31>>>   DVT prophylaxis     Objective: Filed Vitals:   May 05, 2015 1145 05-05-2015 1200 May 05, 2015 1400 05-05-2015 1401  BP: 87/57   67/48  Pulse: 99   121  Temp: 97.7 F (36.5 C) 97.6 F (36.4 C)    TempSrc: Oral Oral    Resp: 22   30  Height:      Weight: 61 kg (134 lb 7.7 oz)     SpO2: 97%  99% 99%    Intake/Output Summary (Last 24 hours) at 05-May-2015 1501 Last data filed at May 05, 2015 1122  Gross per 24 hour  Intake    130 ml  Output    895 ml  Net   -765 ml   Filed Weights   2015-05-05 0440 05-May-2015 0733 05-05-2015 1145  Weight: 62.46 kg (137 lb 11.2 oz) 61 kg (134 lb 7.7 oz) 61 kg (134 lb 7.7 oz)     Exam: General: Alert , awake, chronically ill appearing HEENT: PERRLA, EOMI Lungs: Clear to auscultation bilaterally without wheezes or crackles Cardiovascular: Regular rate and rhythm without murmur gallop or rub normal S1 and S2 Abdomen:negative abdominal pain, negative dysphagia, nondistended, positive soft, bowel sounds, no rebound, no ascites, no appreciable mass, PEG  tube in place negative sign of infection Extremities: No significant cyanosis, clubbing, or edema bilateral lower extremities    Data Reviewed: Basic Metabolic Panel:  Recent Labs Lab 04/23/15 0550  04/24/15 0519 04/24/15 1629 04/25/15 0451 04/26/15 0348 04/27/15 0300 04/28/15 0056 May 05, 2015 0805  NA 139  < > 140 139 140 137 141 134* 133*  K 3.7  < > 4.3 4.2 4.0 3.8 3.2* 3.7 4.1  CL 105  < > 106 107 107 102 106 98* 97*  CO2 23  < > 21* 20* 20* 20* 20* 22 23  GLUCOSE 114*  < > 142* 118* 104* 141* 203* 175* 146*  BUN 12  < > 9 9 11  21* 32* 15 32*  CREATININE 1.06*  < > 0.88 1.00 1.18* 1.81* 2.82* 1.84* 2.90*  CALCIUM 8.9  < > 9.0 9.3 9.3 9.4 9.7 8.5* 9.0  MG 2.4  --  2.6*  --  2.7*  --   --   --   --   PHOS 2.8  < > 2.6 2.5 2.4* 2.6 2.2*  --  2.6  < > = values in this interval not displayed. Liver Function Tests:  Recent Labs Lab 04/24/15 1629 04/25/15 0451 04/26/15 0348 04/27/15 0300 2015/05/05 0805  ALBUMIN 2.2* 2.1* 2.0* 2.0* 1.8*   No results for input(s): LIPASE, AMYLASE in the last 168 hours. No results for input(s): AMMONIA in the last 168 hours. CBC:  Recent Labs Lab 04/25/15 0451 04/26/15 0348 04/27/15 0301 04/28/15 0535  05/18/2015 0430  WBC 20.8* 23.7* 19.2* 12.6* 13.2*  HGB 9.2* 9.1* 9.3* 8.5* 8.1*  HCT 29.4* 29.7* 30.7* 28.0* 26.7*  MCV 92.2 93.4 95.9 96.2 96.4  PLT 144* 132* 120* 77* 77*   Cardiac Enzymes: No results for input(s): CKTOTAL, CKMB, CKMBINDEX, TROPONINI in the last 168 hours. BNP (last 3 results)  Recent Labs  04/06/15 1200 04/17/2015 0922 04/19/15 0400  BNP 717.5* 1333.7* 2795.1*    ProBNP (last 3 results) No results for input(s): PROBNP in the last 8760 hours.  CBG:  Recent Labs Lab 04/22/15 1737  GLUCAP 132*    Recent Results (from the past 240 hour(s))  Culture, Urine     Status: None   Collection Time: 04/20/15  1:40 PM  Result Value Ref Range Status   Specimen Description URINE, CATHETERIZED  Final   Special  Requests Ampicillin Immunocompromised  Final   Culture NO GROWTH 1 DAY  Final   Report Status 04/21/2015 FINAL  Final  C difficile quick scan w PCR reflex     Status: Abnormal   Collection Time: 04/21/15  3:36 PM  Result Value Ref Range Status   C Diff antigen POSITIVE (A) NEGATIVE Final   C Diff toxin POSITIVE (A) NEGATIVE Final   C Diff interpretation Positive for toxigenic C. difficile  Final    Comment: CRITICAL RESULT CALLED TO, READ BACK BY AND VERIFIED WITH: Edgardo Roys RN 16:50 04/21/15 ([email or web address])      Studies: Dg Chest Port 1 View  May 18, 2015  CLINICAL DATA:  65 year old female with Respiratory distress. Initial encounter. EXAM: PORTABLE CHEST 1 VIEW COMPARISON:  04/23/2015 and earlier. FINDINGS: Portable AP semi upright view at 1414 hours. The left upper extremity approach PICC line may have been adjusted in the interim, the tip probably now is at the confluence of the innominate veins. Progressive bilateral pulmonary airspace opacity, now all confluent in the right upper and lower lobes in addition to the left lung base. No superimposed pneumothorax or large effusion. Most mediastinal contours now are obscured. IMPRESSION: 1. Progressed bilateral airspace opacity compatible with severe bilateral pneumonia, pulmonary edema, or ARDS. No pneumothorax or effusion. 2. Left PICC line catheter tip now likely at the confluence of the innominate veins. Electronically Signed   By: Genevie Ann M.D.   On: 05-18-2015 14:28    Scheduled Meds: . allopurinol  200 mg Oral Daily  . antiseptic oral rinse  7 mL Mouth Rinse BID  . aspirin  81 mg Oral Daily  . atorvastatin  40 mg Oral q1800  . calcitRIOL  0.25 mcg Oral QODAY  . calcitRIOL  0.5 mcg Oral QODAY  . darbepoetin (ARANESP) injection - NON-DIALYSIS  200 mcg Subcutaneous Q Tue-1800  . latanoprost  1 drop Both Eyes QHS  . metoprolol tartrate  12.5 mg Oral BID  . metronidazole  500 mg Intravenous Q8H  . midodrine  10 mg Oral TID WC  .  morphine  injection  2 mg Intravenous Once  . multivitamin  1 tablet Oral QHS  . mupirocin ointment   Topical BID  . mycophenolate  180 mg Oral BID  . oxymetazoline  1 spray Each Nare BID  . pantoprazole  40 mg Oral Daily  . predniSONE  5 mg Oral Q breakfast  . sodium chloride flush  10 mL Intravenous Q12H  . tacrolimus  2 mg Oral BID  . timolol  1 drop Both Eyes BID  . warfarin  2 mg Oral ONCE-1800  . Warfarin -  Pharmacist Dosing Inpatient   Does not apply q1800   Continuous Infusions: . sodium chloride 10 mL/hr at 04/27/15 2022  . morphine      Active Problems:   CKD (chronic kidney disease), stage IV (HCC)   HTN (hypertension)   Renal transplant recipient   Acute renal failure superimposed on stage 4 chronic kidney disease (HCC)   Supratherapeutic INR   Enterococcal bacteremia   Hypotension   PAF (paroxysmal atrial fibrillation) (HCC)   Acute on chronic diastolic CHF (congestive heart failure), NYHA class 1 (HCC)   Wheezing   Acute GI bleeding   Palliative care encounter   Acute respiratory failure with hypoxia (HCC)   Chronic combined systolic and diastolic CHF (congestive heart failure) (HCC)   Antiphospholipid syndrome ([employer])   C. difficile colitis    Time spent: 35 minutes    Daune Colgate  [facility] Pager [phone] If 7PM-7AM, please contact night-coverage at [email or web address], password [facility] 2015/05/02, 3:01 PM  LOS: 11 days

## 2015-05-23 NOTE — Discharge Summary (Signed)
Death Summary  Debbie [name] [id number] DOB: August 21, 1950 DOA: May 10, 2015  PCP: No PCP Per Patient  Admit date: May 10, 2015 Date of Death: 05-21-15  Final Diagnoses:   Acute hypoxic respiratory failure  AKi on CKD 4  NSTEMI  CAD  Severe Cardiomyopathy-EF 25%  CKD (chronic kidney disease), stage IV (HCC)  HTN (hypertension)  Renal transplant recipient  Acute renal failure superimposed on stage 4 chronic kidney disease (HCC)  Supratherapeutic INR  Enterococcal bacteremia  Hypotension  PAF (paroxysmal atrial fibrillation) (HCC)  Acute on chronic diastolic CHF (congestive heart failure), NYHA class 1 (HCC)  Wheezing  Acute GI bleeding  Palliative care encounter  Acute respiratory failure with hypoxia (HCC)  Chronic combined systolic and diastolic CHF (congestive heart failure) (HCC)  Antiphospholipid syndrome ([place])  C. difficile colitis   History of present illness:  Chief Complaint: CP HPI: Debbie [name] is a 65 y.o. female chronically very ill with CKD 4, renal transplant, CAD presented with Chest pain, midsternal to left chest. Intermittent since being discharged from hospital last week. Patient stated that this chest pain felt like her previous heart attack chest pain. Improved with nitroglycerin and aspirin. Associated with intermittent but progressive lower extremity swelling since last admission. Patient also reported wheezing since presenting to the emergency room. Continues her IV antibiotics for recent bacteremia  Hospital Course:  Acute Respiratory Failure  -required BiPAP, due to Volume overload 2/2 RF and CHF intermittently -also had NSTEMI this admission, unable to have Cardiac workup at this time -was on CVVH, then started intermittent HD per Renal -remained anuric, was on HD today but unable to have fluid removed due to soft/low BP -then started having more distress and chest pain today, seen by Dr.Jordan and felt that she likely has severe  CAD, discussed poor prognosis with daughter and Palliative re consulted. -Has been followed by palliative medicine this admission. -She looked like she was actively dying/in severe distress at this time on 05/21/22 with RR in 35-40, HR in 130s -140, BP 60-70s in Limbs, worsening mentation and updated daughter at bedside, already DNR, discussed Morphine and Comfort care with family they were at bedside and daughter agreeable to comfort care, started IV morphine and Morphine gtt for comfort care and subsequently expired  Acute on Chronic stage 4 renal failure/S/P Renal Transplant -CVVH > transitioned to IHD per Renal, BP limited fluid removal  Acute on chronic combined systolic/diastolic CHF -see above  Severe secondary PAH -See above  NSTEMI ( mildly elevated troponin)/Hypotension --Peak troponin 0.95. New wall motion abnormality on echo and EF decreased to 25-30%.Per Cards not been considered a good candidate for cardiac cath due to advanced renal disease, frailty, and anemia. Continue statin, lopressor, ASA -Cards recommended Palliative care  PAF  Anti-Phospholipid antibody syndrome/ History DVT's  C.diff colitis   H/o Enterococcus Bacteremia:  -Enterococcus w/ septic arthritis of R shoulder. TEE negative for vegetations. Refused I&D of R shoulder previously. -seen by ID last admission -completed course on 4/4  Hypotensive  - Adrenal Suppression in a pt on chronic steroids -Stopped hydrocortisone and resumed prednisone -took prednisone 5 mg daily at home  Hx of Right fibular and medial malleolus fracture:  -Followed by Dr. Wandra Feinstein to prior cardiac issues patient was not considered a candidate for operative correction    Time: 69min  Signed:  Nel Stoneking [facility] 05/15/2015, 5:55 PM

## 2015-05-23 NOTE — Hospital Discharge Follow-Up (Signed)
Transitional Care Clinic Care Coordination Note:  Admit date:  04/12/2015 Discharge date: TBD Discharge Disposition: Home with home health services Patient contact: [phone] (home) Emergency contact(s): [name] ([phone]  This Case Manager reviewed patient's EMR and determined patient would benefit from post-discharge medical management and chronic care management services through the [facility]. Patient has a history of CKD Stage IV-s/p renal transplant, HTN, CHF, paroxysmal atrial fibrillation. Admitted with NSTEMI. Patient has had 4 inpatient admissions and 2 ED visits in the last six months. This Case Manager met with patient and patient's daughter, [name], to discuss the services and medical management that can be provided at the [facility]. Patient's daughter thought patient would benefit from receiving post-discharge care at the [facility]. Patient did begin experiencing chest pain while this Case Manager was in the room speaking with her and her patient's daughter. Patient's bedside RN informed. She indicated she notified physician.  Patient scheduled for Transitional Care appointment on:  TO BE DETERMINED-will schedule appointment when closer to discharge.   Assessment:       Home Environment: Patient admitted from SNF[facility]; however, patient's family do not want her to return there after discharge. Patient's family plans to take patient home and provide care with support of home health services [facility] RN, PT, HHA) from [place].       Support System: family       Level of functioning: total care needed-Patient unable to ambulate and needing assist with all activities of daily living.       Home DME: wheelchair, rollater, bedside commode. Patient will need hospital bed prior to discharge. Babette Relic, RN CM aware.       Home care services: Patient has had home health services with [facility] in the  past. Home health RN, PT, HHA with [facility] planned once discharged.       Transportation: Patient's daughter, Kenney Houseman, indicated family will take patient to her medical appointments.        Food/Nutrition: Patient's daughter also indicated patient's family will shop for and prepare all meals for patient.        Medications: Patient gets her medications from [facility] on [address]. Patient's daughter denied problems obtaining or affording medications.        Identified Barriers: no PCP-Patient would benefit from establishing care with PCP at [facility] after 30 days of medical management and follow-up with the [facility]. Patient's daughter aware.        PCP: none             Arranged services:        Services communicated to [employer], RN CM

## 2015-05-23 NOTE — Procedures (Signed)
Pt seen on HD.  Ap 120 Vp 170.  BFR 400.  Not sure how reliable her BP are as they are taken in her leg.

## 2015-05-23 NOTE — Progress Notes (Addendum)
May 27, 2015 patient came back from hemodialysis at 1230 and while in patient room patient c/o shortness of breath. She was place on venti mask saturation was 87 to 88 on 4 liters . She continue to c/o shortness of breath then chest tightness. Pain of a five on the left side of chest. Patient systolic blood pressure was in the mid 80's. She was eventually place on a non rebreather her saturation was up to 97 to 100. Patient was given nitro sublingual at 1313 in 5 minutes she said he pain went down to a 4. Respiratory place patient on a bipap. Dr Broadus John was made aware, she stop by to evaluate patient. Per Dr Broadus John patient was given morphine 2mg  and another dose at 1515. Order was given to put patient on a Morphine drip and titrate for comfort. [facility] RN.

## 2015-05-23 NOTE — Progress Notes (Signed)
[facility] for warfarin Indication: atrial fibrillation, DVT, antiphospholipid syndrome  Allergies  Allergen Reactions  . Feraheme [Ferumoxytol] Other (See Comments)    Chest pain, pulsating 02/17/15: tolerated Nulecit  . Vancomycin Anaphylaxis, Itching, Swelling and Other (See Comments)    Tongue swell  . Dorzolamide Hcl-Timolol Mal Rash and Other (See Comments)    Eye pain  . Gentamycin [Gentamicin Sulfate] Itching and Swelling  . Latanoprost Rash and Other (See Comments)    Eye pain  . Cefazolin Itching  . Codeine Itching  . Hydrocodone-Acetaminophen Itching  . Penicillins Itching    *tolerated ampicillin during 02/2015 admission Has patient had a PCN reaction causing immediate rash, facial/tongue/throat swelling, SOB or lightheadedness with hypotension:No Has patient had a PCN reaction causing severe rash involving mucus membranes or skin necrosis:No Has patient had a PCN reaction that required hospitalization:No Has patient had a PCN reaction occurring within the last 10 years:No If all of the above answers are "NO", then may proceed with Cephalosporin use.     Patient Measurements: Height: 5\' 1"  (154.9 cm) Weight: 134 lb 7.7 oz (61 kg) IBW/kg (Calculated) : 47.8 Heparin Dosing Weight: 63kg   Vital Signs: Temp: 97.6 F (36.4 C) (04/07 1200) Temp Source: Oral (04/07 1200) BP: 87/57 mmHg (04/07 1145) Pulse Rate: 99 (04/07 1145)  Labs:  Recent Labs  04/27/15 0300  04/27/15 0301 04/28/15 0056 04/28/15 0535 04/28/15 0546 May 20, 2015 0430 05-20-2015 0805  HGB  --   < > 9.3*  --  8.5*  --  8.1*  --   HCT  --   --  30.7*  --  28.0*  --  26.7*  --   PLT  --   --  120*  --  77*  --  77*  --   LABPROT 17.7*  --   --   --  18.9*  --  17.2*  --   INR 1.45  --   --   --  1.58*  --  1.40  --   HEPARINUNFRC 0.62  --   --   --   --  0.35  --   --   CREATININE 2.82*  --   --  1.84*  --   --   --  2.90*  < > = values in this interval not  displayed.  Estimated Creatinine Clearance: 16.2 mL/min (by C-G formula based on Cr of 2.9).   Medications:  Infusions:  . sodium chloride 10 mL/hr at 04/27/15 2022    Assessment: 69 yof presented to the hospital with CP. On chronic coumadin for hx afib, DVT, and anti-phospholipid syndrome. Coumadin was reversed on 3/29 and INR remains subtherapeutic. Heparin bridge stopped 4/6 due to low platelets. INR may be difficult to control while on Flagyl.  INR 1.58>1.4, Hgb 8.1 stable, plt low stable 77 today. No bleed documented.  PTA warfarin dose: 1mg  daily except 0.5 mg on MF  Goal of Therapy:  INR 2-3 Monitor platelets by anticoagulation protocol: Yes   Plan:  - Warfarin 2 mg po x 1 tonight - Daily INR - Monitor CBC, S&S of bleeding - Flagyl may increase anticoag effect of warfarin  Elicia Lamp, PharmD, [facility] Clinical Pharmacist Pager [phone] 2015/05/20 1:50 PM

## 2015-05-23 NOTE — Progress Notes (Signed)
Patient Name: MAJORIE ALLANSON Date of Encounter: 05/11/15  Active Problems:   CKD (chronic kidney disease), stage IV (HCC)   HTN (hypertension)   Renal transplant recipient   Acute renal failure superimposed on stage 4 chronic kidney disease (HCC)   Supratherapeutic INR   Enterococcal bacteremia   Hypotension   PAF (paroxysmal atrial fibrillation) (HCC)   Acute on chronic diastolic CHF (congestive heart failure), NYHA class 1 (HCC)   Wheezing   Acute GI bleeding   Palliative care encounter   Acute respiratory failure with hypoxia (HCC)   Chronic combined systolic and diastolic CHF (congestive heart failure) ([place])   Antiphospholipid syndrome ([place])   C. difficile colitis   Primary Cardiologist: Dr Radford Pax  Patient Profile: 65 y.o. female with past medical history of anti-phospholipid antibody syndrome/ history of DVT's (on chronic Coumadin), PAF, chronic systolic CHF (EF [id number]), ESRD (s/p transplant in 2010, now on chronic immunosuppression), HTN, HLD, severe pulmonary HTN, and recent NSTEMI in 01/2015 (troponin peak of 13.48 - treated medically) who presents to [facility] ED on 04/16/2015 for evaluation of chest pain.  SUBJECTIVE: C/o severe chest pain, started during HD. During HD, her SBP went down, nadir 53, now at 64. Chest pain seems to have started as her SBP went below 85. During HD she had albumin and IVF, net negative 500 cc.  Pt not oriented to place or date. Knows she is not at home.   OBJECTIVE Filed Vitals:   May 11, 2015 0930 11-May-2015 1000 11-May-2015 1030 11-May-2015 1100  BP: 146/30 82/60 85/39  72/45  Pulse: 85 83 91 94  Temp:      TempSrc:      Resp:      Height:      Weight:      SpO2:        Intake/Output Summary (Last 24 hours) at 11-May-2015 1134 Last data filed at 05/11/15 0600  Gross per 24 hour  Intake    170 ml  Output    100 ml  Net     70 ml   Filed Weights   04/27/15 1801 May 11, 2015 0440 2015/05/11 0733  Weight: 136 lb 0.4 oz (61.7 kg) 137 lb 11.2  oz (62.46 kg) 134 lb 7.7 oz (61 kg)    PHYSICAL EXAM General: Well developed, well nourished, female in acute distress. Head: Normocephalic, atraumatic.  Neck: Supple without bruits, JVD elevated but pt supine. Lungs:  Resp regular and unlabored, rales bases. Heart: RRR, S1, S2, no S3, S4, or murmur; no rub. Abdomen: Soft, non-tender, non-distended, BS + x 4.  Extremities: No clubbing, cyanosis, edema.  Neuro: Alert and oriented X 1. Moves all extremities spontaneously.  LABS: CBC: Recent Labs  04/28/15 0535 2015/05/11 0430  WBC 12.6* 13.2*  HGB 8.5* 8.1*  HCT 28.0* 26.7*  MCV 96.2 96.4  PLT 77* 77*   INR: Recent Labs  2015/05/11 0430  INR [id number]   Basic Metabolic Panel: Recent Labs  04/27/15 0300 04/28/15 0056 05-11-2015 0805  NA 141 134* 133*  K 3.2* 3.7 4.1  CL 106 98* 97*  CO2 20* 22 23  GLUCOSE 203* 175* 146*  BUN 32* 15 32*  CREATININE 2.82* 1.84* 2.90*  CALCIUM 9.7 8.5* 9.0  PHOS 2.2*  --  2.6   Liver Function Tests: Recent Labs  04/27/15 0300 05/11/15 0805  ALBUMIN 2.0* 1.8*   BNP:  B NATRIURETIC PEPTIDE  Date/Time Value Ref Range Status  04/19/2015 04:00 AM 2795.1* 0.0 - 100.0 pg/mL  Final  04/04/2015 09:22 AM 1333.7* 0.0 - 100.0 pg/mL Final   TELE:  SR, ST with rare short bursts of ?atrial tach       Current Medications:  . allopurinol  200 mg Oral Daily  . antiseptic oral rinse  7 mL Mouth Rinse BID  . aspirin  81 mg Oral Daily  . atorvastatin  40 mg Oral q1800  . calcitRIOL  0.25 mcg Oral QODAY  . calcitRIOL  0.5 mcg Oral QODAY  . darbepoetin (ARANESP) injection - NON-DIALYSIS  200 mcg Subcutaneous Q Tue-1800  . latanoprost  1 drop Both Eyes QHS  . metoprolol tartrate  12.5 mg Oral BID  . metronidazole  500 mg Intravenous Q8H  . midodrine  10 mg Oral TID WC  . multivitamin  1 tablet Oral QHS  . mupirocin ointment   Topical BID  . mycophenolate  180 mg Oral BID  . oxymetazoline  1 spray Each Nare BID  . pantoprazole  40 mg Oral Daily  .  predniSONE  5 mg Oral Q breakfast  . sodium chloride flush  10 mL Intravenous Q12H  . tacrolimus  2 mg Oral BID  . timolol  1 drop Both Eyes BID  . Warfarin - Pharmacist Dosing Inpatient   Does not apply q1800   . sodium chloride 10 mL/hr at 04/27/15 2022    ASSESSMENT AND PLAN: 1. NSTEMI: Pt admitted 04/13/2015 with chest pain and dyspnea with volume overload in setting of acute on chronic renal failure and severe anemia and found to have mildly elevated troponin, 0.95. New wall motion abnormality on echo and EF decreased to 25-30%. - In the past she has not been considered a good candidate for cardiac cath due to advanced renal disease, frailty, and anemia. - Continue statin and ASA. Has not had Lopressor since 03/05 due to hypotension.  - Will need to clarify goals of care as condition improves prior to performing invasive evaluation.  - For the above reasons I would favor continued medical therapy with ASA, statin, Imdur, and beta blocker as BP will allow. - Cannot give Imdur, SL NTG or BB due to low BP. Will add morphine with parameters.  2. Cardiomyopathy/Acute on Chronic Diastolic CHF: Echo now with new wall motion abnormality and LVEF=25-30%. EF was normal in February 2017. -  Volume management per dialysis.  - Consider repeating Echo in 4-6 weeks once volume status corrected and she is on stable medical therapy.  3. 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 HD today but BP would not allow much fluid to be removed. She was given albumin  4. Anti-phospholipid antibody syndrome/ history of DVT's: She is on chronic coumadin but this has been held recently due to supra- therapeutic INR and anemia. INR is 1.40. Heparin d/c'd 04/06  5. History of PAF: Continue beta blocker as BP will allow. Pt in sinus at present. - This patients CHA2DS2-VASc Score and unadjusted Ischemic Stroke Rate (% per year) is equal to 7.2 % stroke rate/year from a score of 5 (CHF, HTN,  Vascular, Age, Female). Resuming coumadin.  6. GERD with heartburn. On PPI.   7. Chronic anemia - H&H a little lower today  8. Code status: - no CPR, DEFIB or intubation - BiPAP and ACLS meds are OK - may need to consider transition to comfort care.  Active Problems:   CKD (chronic kidney disease), stage IV (HCC)   HTN (hypertension)   Renal transplant recipient  Acute renal failure superimposed on stage 4 chronic kidney disease (HCC)   Supratherapeutic INR   Enterococcal bacteremia   Hypotension   PAF (paroxysmal atrial fibrillation) (HCC)   Acute on chronic diastolic CHF (congestive heart failure), NYHA class 1 (HCC)   Wheezing   Acute GI bleeding   Palliative care encounter   Acute respiratory failure with hypoxia (HCC)   Chronic combined systolic and diastolic CHF (congestive heart failure) ([place])   Antiphospholipid syndrome ([place])   C. difficile colitis   Signed, Rosaria Ferries , PA-C 11:34 AM May 28, 2015 Patient seen and examined and history reviewed. Agree with above findings and plan. Above events noted. Patient developed significant chest pain during dialysis. Describes this as heavy pressure associated with SOB. By leg cuff she was hypotensive but this may not be an accurate reflection of her BP. Now pain is better. Dyspnea is improved with face mask oxygen. Ecg shows no change in lateral ischemia.  This is a difficult situation. She likely has severe CAD. She is not a surgical candidate and even doing a cardiac cath will carry a high risk. Intervention with stenting would carry an increased risk of bleeding if she required DAPT especially since she has been on chronic coumadin. I spoke with family- daughter and sister present. Daughter reinforced patient's desire for no intubation, defibrillation, or CPR. I would recommend palliative care consult. We need to define goals of care. I would not recommend invasive work up at this time since long term benefit is likely very  limited.   Peter Martinique, [place] 2015/05/28 1:36 PM

## 2015-05-23 NOTE — Discharge Summary (Deleted)
Death Summary  Debbie [name] [id number] DOB: [birth year] DOA: May 16, 2015  PCP: No PCP Per Patient  Admit date: 2015/05/16 Date of Death: 05-27-15  Final Diagnoses:    Acute hypoxic respiratory failure   AKi on CKD 4   NSTEMI   CAD   Severe Cardiomyopathy-EF 25%   CKD (chronic kidney disease), stage IV (HCC)   HTN (hypertension)   Renal transplant recipient   Acute renal failure superimposed on stage 4 chronic kidney disease (HCC)   Supratherapeutic INR   Enterococcal bacteremia   Hypotension   PAF (paroxysmal atrial fibrillation) (HCC)   Acute on chronic diastolic CHF (congestive heart failure), NYHA class 1 (HCC)   Wheezing   Acute GI bleeding   Palliative care encounter   Acute respiratory failure with hypoxia (HCC)   Chronic combined systolic and diastolic CHF (congestive heart failure) (HCC)   Antiphospholipid syndrome ([place])   C. difficile colitis   History of present illness:  Chief Complaint: CP HPI: Debbie [name] is a 65 y.o. female chronically very ill with CKD 4, renal transplant, CAD presented with Chest pain, midsternal to left chest. Intermittent since being discharged from hospital last week. Patient stated that this chest pain felt like her previous heart attack chest pain. Improved with nitroglycerin and aspirin. Associated with intermittent but progressive lower extremity swelling since last admission. Patient also reported wheezing since presenting to the emergency room. Continues her IV antibiotics for recent bacteremia  Hospital Course:  Acute Respiratory Failure  -required BiPAP, due to Volume overload 2/2 RF and CHF intermittently -also had NSTEMI this admission, unable to have Cardiac workup at this time -was on CVVH, then started intermittent HD per Renal -remained anuric, was on HD today but unable to have fluid removed due to soft/low BP -then started having more distress and chest pain today, seen by Dr.Jordan and felt that she likely has severe  CAD, discussed poor prognosis with daughter and Palliative re consulted. -Has been followed by palliative medicine this admission. -She looked like she was actively dying/in severe distress at this time on May 27, 2022 with RR in 35-40, HR in 130s -140, BP 60-70s in Limbs, worsening mentation and updated daughter at bedside, already DNR, discussed Morphine and Comfort care with family they were at bedside and daughter agreeable to comfort care, started IV morphine and  Morphine gtt for comfort care and subsequently expired  Acute on Chronic stage 4 renal failure/S/P Renal Transplant -CVVH > transitioned to IHD per Renal, BP limited fluid removal  Acute on chronic combined systolic/diastolic CHF -see above  Severe secondary PAH -See above  NSTEMI ( mildly elevated troponin)/Hypotension --Peak troponin 0.95. New wall motion abnormality on echo and EF decreased to 25-30%.Per Cards not been considered a good candidate for cardiac cath due to advanced renal disease, frailty, and anemia. Continue statin, lopressor, ASA -Cards recommended Palliative care  PAF  Anti-Phospholipid antibody syndrome/ History DVT's  C.diff colitis   H/o Enterococcus Bacteremia:  -Enterococcus w/ septic arthritis of R shoulder. TEE negative for vegetations. Refused I&D of R shoulder previously. -seen by ID last admission -completed course on 4/4  Hypotensive  - Adrenal Suppression in a pt on chronic steroids -Stopped hydrocortisone and resumed  prednisone -took prednisone 5 mg daily at home  Hx of Right fibular and medial malleolus fracture:  -Followed by Dr. Wandra Feinstein to prior cardiac issues patient was not considered a candidate for operative correction    Time: 64min  Signed:  Mirai Greenwood  [facility] 05/15/2015, 5:55  PM

## 2015-05-23 NOTE — Progress Notes (Signed)
29-May-2015 Patient did not get her Coumadin at 1800 she is not alert on bipap and is comfort. Pharmacy was made aware. Rico Sheehan RN

## 2015-05-23 DEATH — deceased

## 2016-07-11 IMAGING — CT CT FOREARM*R* W/O CM
4 of 7 series · 13 of 33 positions shown, 15 images · non-contrast
Comparison: None.

CLINICAL DATA: Fell 3-4 days ago.  Injured right upper extremity.

EXAM:
CT OF THE RIGHT SHOULDER WITHOUT CONTRAST; CT OF THE RIGHT HUMERUS
WITHOUT CONTRAST; CT OF THE RIGHT FOREARM WITHOUT CONTRAST
TECHNIQUE: Multidetector CT imaging was performed according to the standard
protocol. Multiplanar CT image reconstructions were also generated.

[Series 3: rt upper arm · axial · 0.72mm/px · z∈[+1128,+1352]mm · 4 of 127 slices shown, 5 images]
[im 26/127  soft-tissue]
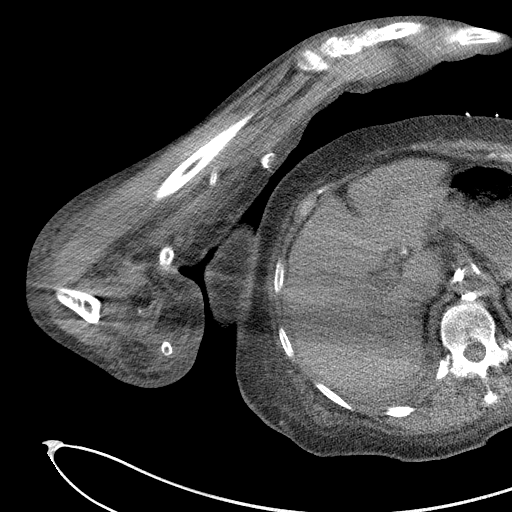
[im 26/127  bone]
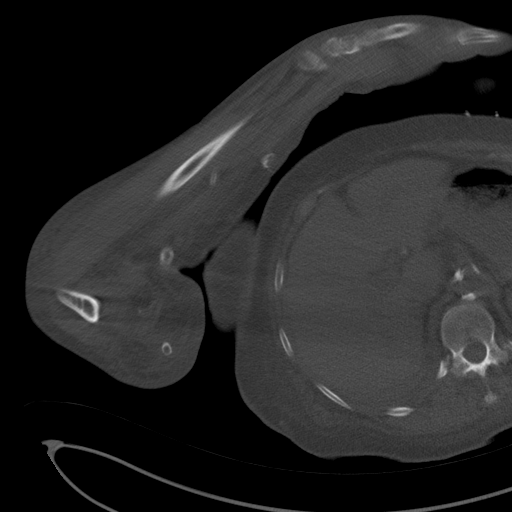
[im 51/127  bone]
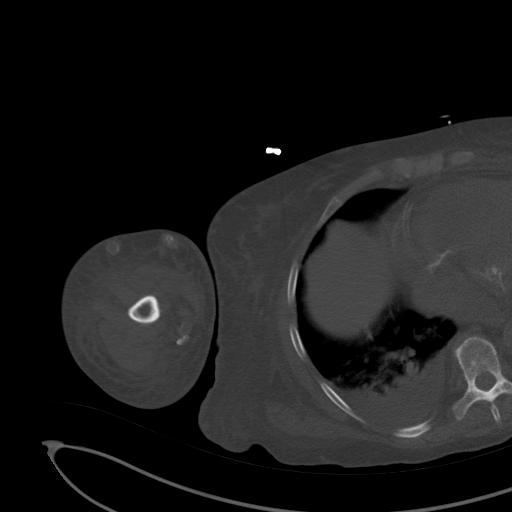
[im 76/127  bone]
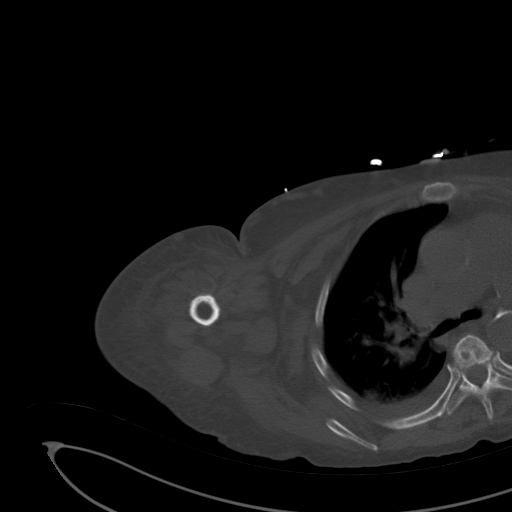
[im 101/127  bone]
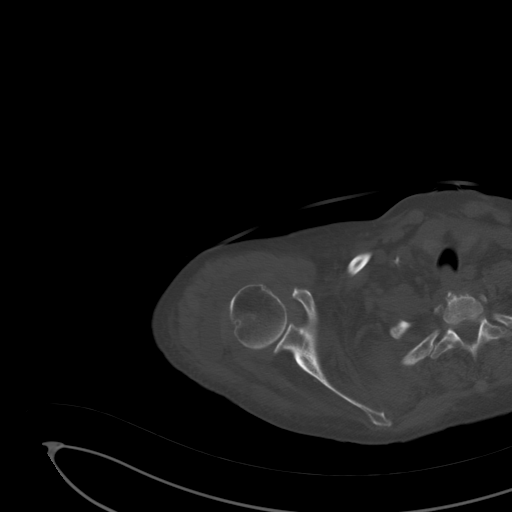

[Series 7: axial humerus · axial · 0.53mm/px · z∈[+1194,+1376]mm · 3 of 137 slices shown]
[im 35/137  bone]
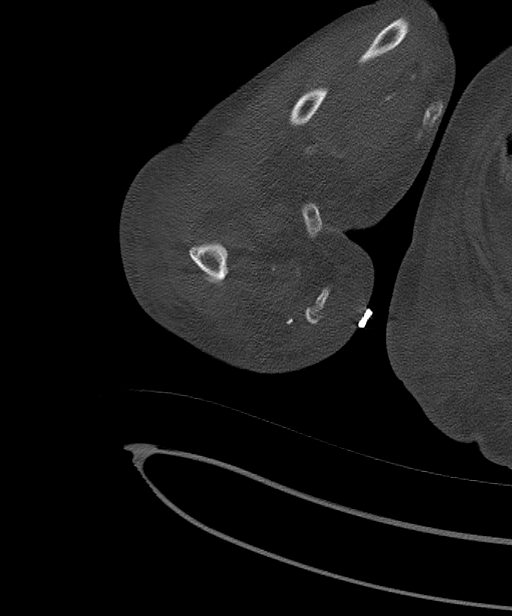
[im 69/137  bone]
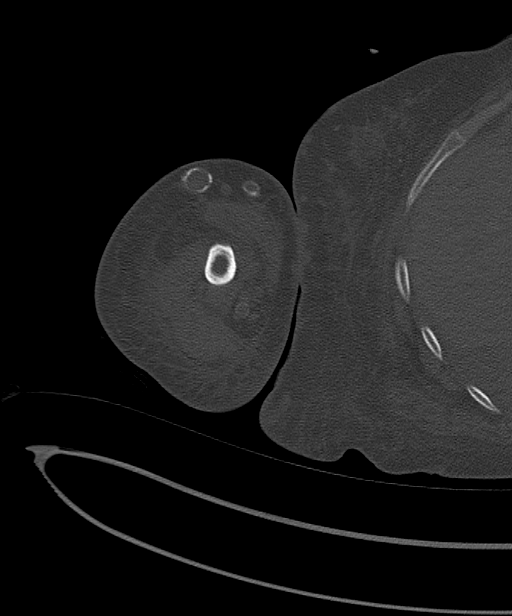
[im 103/137  bone]
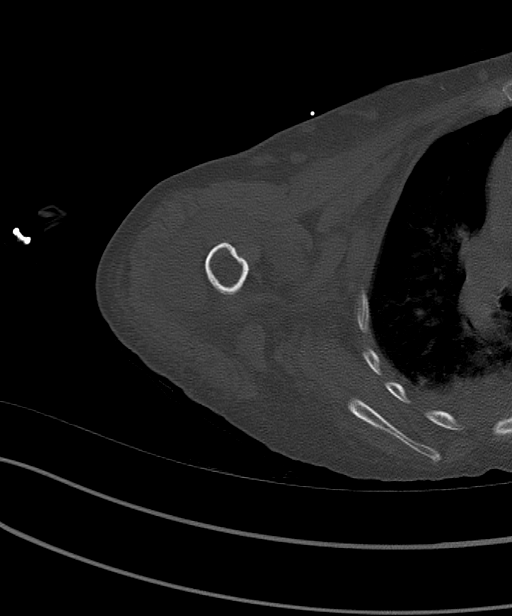

[Series 11: cor shoulder · coronal · 0.39mm/px · 1 of 52 slices shown]
[im 26/52  bone]
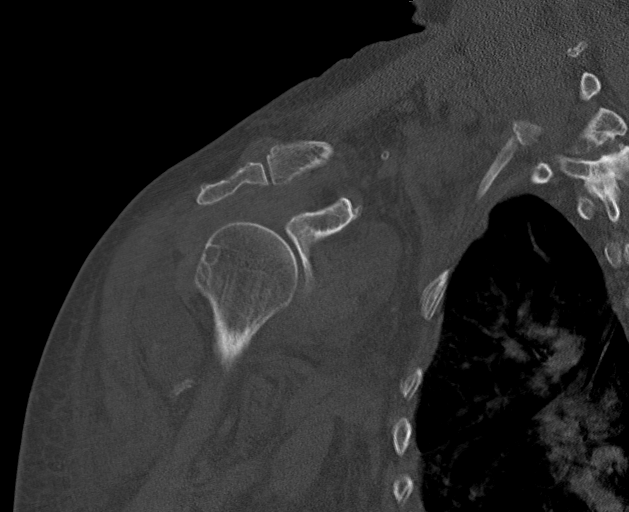

[Series 15: sag shoulder · sagittal · 0.47mm/px · 5 of 108 slices shown, 6 images]
[im 36/108  bone]
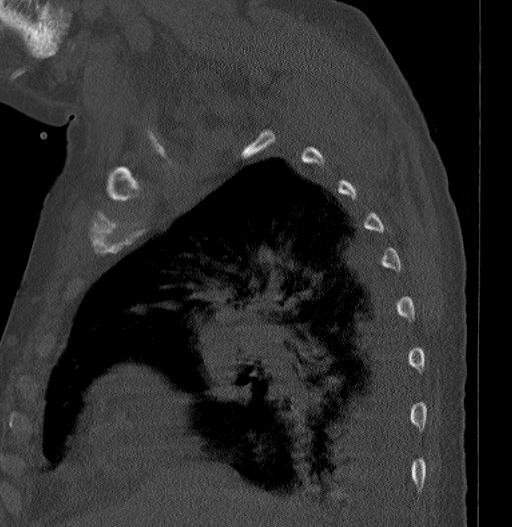
[im 45/108  bone]
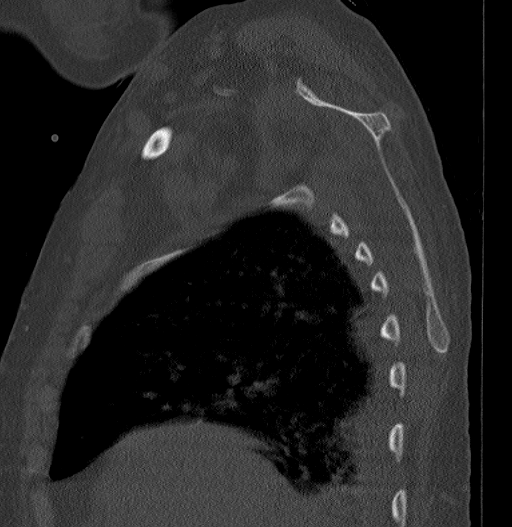
[im 54/108  soft-tissue]
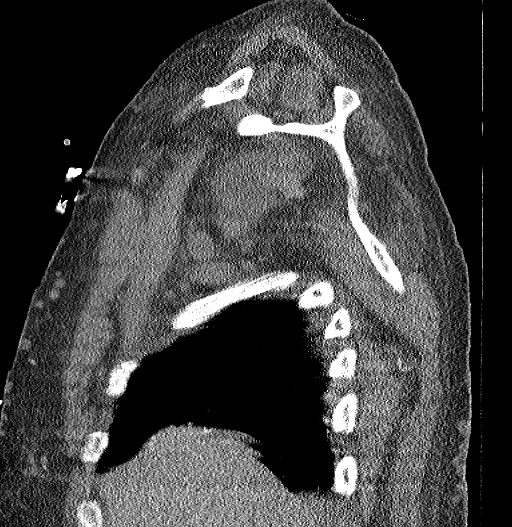
[im 54/108  bone]
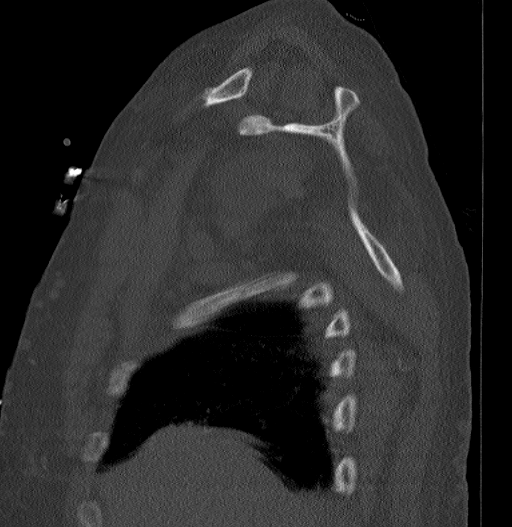
[im 63/108  bone]
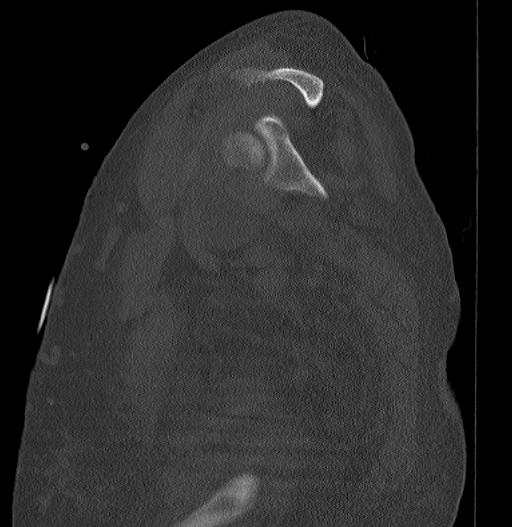
[im 72/108  bone]
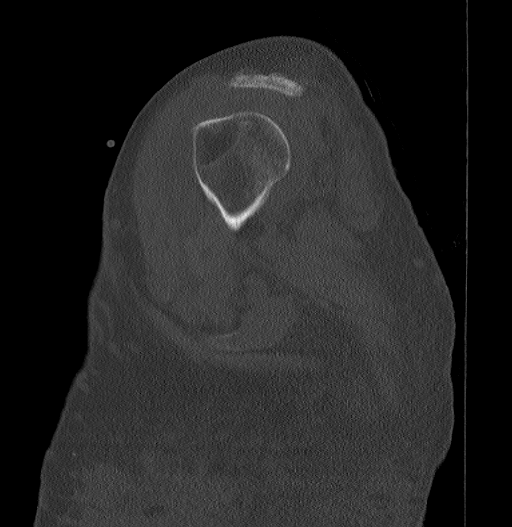

[13 of 33 positions shown; findings below may reference images not displayed]

FINDINGS: No acute fractures are identified. The shoulder joint is maintained.
No fracture or dislocation. Mild AC joint degenerative changes. No
AC joint separation. No humeral fracture.

There is extensive subacromial/subdeltoid and subscapularis
bursitis. There is also a large glenohumeral joint effusion and
severe synovitis. Dialysis grafts are noted in the right upper
extremity.

The elbow joint is maintained. No elbow fracture or obvious joint
effusion. Headache cardiac

No forearm fractures are identified.

There is a moderate-sized right pleural effusion with overlying
atelectasis. No obvious acute right rib fractures are identified.
The thoracic vertebral bodies are intact. Moderate degenerative
changes noted at the sternoclavicular joints.

Small scarred right kidney is noted. There is advanced
atherosclerotic calcifications involving the aorta and branch
vessels.
IMPRESSION: No acute fractures of the right upper extremity are identified. The
joints are maintained.

Extensive synovitis, joint effusion and bursitis involving the right
shoulder.
